# Patient Record
Sex: Female | Born: 1950
Health system: Southern US, Community
[De-identification: ages and names within clinical notes are randomized; demographics above are authoritative.]

## PROBLEM LIST (undated history)

## (undated) DIAGNOSIS — T7840XA Allergy, unspecified, initial encounter: Secondary | ICD-10-CM

## (undated) DIAGNOSIS — E785 Hyperlipidemia, unspecified: Secondary | ICD-10-CM

## (undated) DIAGNOSIS — C801 Malignant (primary) neoplasm, unspecified: Secondary | ICD-10-CM

## (undated) DIAGNOSIS — F419 Anxiety disorder, unspecified: Secondary | ICD-10-CM

## (undated) DIAGNOSIS — K219 Gastro-esophageal reflux disease without esophagitis: Secondary | ICD-10-CM

## (undated) DIAGNOSIS — I209 Angina pectoris, unspecified: Secondary | ICD-10-CM

## (undated) DIAGNOSIS — I10 Essential (primary) hypertension: Secondary | ICD-10-CM

## (undated) DIAGNOSIS — L570 Actinic keratosis: Secondary | ICD-10-CM

## (undated) DIAGNOSIS — I428 Other cardiomyopathies: Secondary | ICD-10-CM

## (undated) DIAGNOSIS — D649 Anemia, unspecified: Secondary | ICD-10-CM

## (undated) DIAGNOSIS — M069 Rheumatoid arthritis, unspecified: Secondary | ICD-10-CM

## (undated) DIAGNOSIS — F329 Major depressive disorder, single episode, unspecified: Secondary | ICD-10-CM

## (undated) DIAGNOSIS — K5790 Diverticulosis of intestine, part unspecified, without perforation or abscess without bleeding: Secondary | ICD-10-CM

## (undated) DIAGNOSIS — R918 Other nonspecific abnormal finding of lung field: Secondary | ICD-10-CM

## (undated) HISTORY — DX: Rheumatoid arthritis, unspecified: M06.9

## (undated) HISTORY — DX: Actinic keratosis: L57.0

## (undated) HISTORY — PX: BACK SURGERY: SHX140

## (undated) HISTORY — PX: COLONOSCOPY: SHX174

## (undated) HISTORY — DX: Essential (primary) hypertension: I10

## (undated) HISTORY — PX: CARPAL TUNNEL RELEASE: SHX101

## (undated) HISTORY — PX: ROTATOR CUFF REPAIR: SHX139

## (undated) HISTORY — DX: Allergy, unspecified, initial encounter: T78.40XA

## (undated) HISTORY — PX: ABDOMINAL HYSTERECTOMY: SHX81

## (undated) HISTORY — PX: KNEE SURGERY: SHX244

## (undated) HISTORY — PX: HERNIA REPAIR: SHX51

## (undated) HISTORY — PX: APPENDECTOMY: SHX54

## (undated) HISTORY — DX: Hyperlipidemia, unspecified: E78.5

## (undated) HISTORY — DX: Diverticulosis of intestine, part unspecified, without perforation or abscess without bleeding: K57.90

## (undated) HISTORY — DX: Anxiety disorder, unspecified: F41.9

## (undated) HISTORY — DX: Gastro-esophageal reflux disease without esophagitis: K21.9

## (undated) HISTORY — PX: CHOLECYSTECTOMY: SHX55

## (undated) HISTORY — PX: SHOULDER SURGERY: SHX246

## (undated) HISTORY — PX: CATARACT EXTRACTION: SUR2

## (undated) HISTORY — PX: UPPER GASTROINTESTINAL ENDOSCOPY: SHX188

## (undated) HISTORY — DX: Malignant (primary) neoplasm, unspecified: C80.1

## (undated) HISTORY — DX: Other nonspecific abnormal finding of lung field: R91.8

---

## 1997-11-01 ENCOUNTER — Ambulatory Visit (HOSPITAL_BASED_OUTPATIENT_CLINIC_OR_DEPARTMENT_OTHER): Admission: RE | Admit: 1997-11-01 | Discharge: 1997-11-01 | Payer: Self-pay | Admitting: Orthopedic Surgery

## 1998-04-19 ENCOUNTER — Ambulatory Visit (HOSPITAL_COMMUNITY): Admission: RE | Admit: 1998-04-19 | Discharge: 1998-04-19 | Payer: Self-pay | Admitting: Family Medicine

## 1998-04-21 ENCOUNTER — Ambulatory Visit (HOSPITAL_COMMUNITY): Admission: RE | Admit: 1998-04-21 | Discharge: 1998-04-21 | Payer: Self-pay | Admitting: Internal Medicine

## 1998-04-21 ENCOUNTER — Encounter: Payer: Self-pay | Admitting: Internal Medicine

## 1998-12-09 ENCOUNTER — Inpatient Hospital Stay (HOSPITAL_COMMUNITY): Admission: EM | Admit: 1998-12-09 | Discharge: 1998-12-11 | Payer: Self-pay | Admitting: Emergency Medicine

## 1998-12-18 ENCOUNTER — Ambulatory Visit (HOSPITAL_COMMUNITY): Admission: RE | Admit: 1998-12-18 | Discharge: 1998-12-18 | Payer: Self-pay | Admitting: *Deleted

## 1999-01-08 ENCOUNTER — Ambulatory Visit (HOSPITAL_COMMUNITY): Admission: RE | Admit: 1999-01-08 | Discharge: 1999-01-08 | Payer: Self-pay | Admitting: Gastroenterology

## 1999-02-07 ENCOUNTER — Ambulatory Visit (HOSPITAL_COMMUNITY): Admission: RE | Admit: 1999-02-07 | Discharge: 1999-02-07 | Payer: Self-pay | Admitting: Gastroenterology

## 1999-02-07 ENCOUNTER — Encounter: Payer: Self-pay | Admitting: Gastroenterology

## 1999-05-14 ENCOUNTER — Ambulatory Visit (HOSPITAL_COMMUNITY): Admission: RE | Admit: 1999-05-14 | Discharge: 1999-05-14 | Payer: Self-pay | Admitting: *Deleted

## 1999-06-04 ENCOUNTER — Encounter: Admission: RE | Admit: 1999-06-04 | Discharge: 1999-06-12 | Payer: Self-pay | Admitting: *Deleted

## 1999-06-29 ENCOUNTER — Ambulatory Visit (HOSPITAL_COMMUNITY): Admission: RE | Admit: 1999-06-29 | Discharge: 1999-06-29 | Payer: Self-pay | Admitting: *Deleted

## 2000-04-24 ENCOUNTER — Ambulatory Visit (HOSPITAL_COMMUNITY): Admission: RE | Admit: 2000-04-24 | Discharge: 2000-04-24 | Payer: Self-pay | Admitting: Internal Medicine

## 2002-07-19 ENCOUNTER — Ambulatory Visit (HOSPITAL_COMMUNITY): Admission: RE | Admit: 2002-07-19 | Discharge: 2002-07-19 | Payer: Self-pay | Admitting: Internal Medicine

## 2002-07-19 ENCOUNTER — Encounter: Payer: Self-pay | Admitting: Internal Medicine

## 2003-04-18 ENCOUNTER — Encounter: Payer: Self-pay | Admitting: Internal Medicine

## 2003-04-18 ENCOUNTER — Ambulatory Visit (HOSPITAL_COMMUNITY): Admission: RE | Admit: 2003-04-18 | Discharge: 2003-04-18 | Payer: Self-pay | Admitting: Internal Medicine

## 2003-05-28 ENCOUNTER — Emergency Department (HOSPITAL_COMMUNITY): Admission: EM | Admit: 2003-05-28 | Discharge: 2003-05-28 | Payer: Self-pay | Admitting: Emergency Medicine

## 2004-05-14 ENCOUNTER — Emergency Department (HOSPITAL_COMMUNITY): Admission: EM | Admit: 2004-05-14 | Discharge: 2004-05-14 | Payer: Self-pay | Admitting: Emergency Medicine

## 2004-07-05 ENCOUNTER — Ambulatory Visit: Payer: Self-pay | Admitting: Family Medicine

## 2004-08-08 ENCOUNTER — Ambulatory Visit: Payer: Self-pay | Admitting: Family Medicine

## 2004-08-08 ENCOUNTER — Encounter: Admission: RE | Admit: 2004-08-08 | Discharge: 2004-08-08 | Payer: Self-pay | Admitting: Sports Medicine

## 2004-08-16 ENCOUNTER — Ambulatory Visit: Payer: Self-pay | Admitting: Sports Medicine

## 2004-09-14 ENCOUNTER — Ambulatory Visit: Payer: Self-pay | Admitting: Family Medicine

## 2004-11-05 ENCOUNTER — Ambulatory Visit: Payer: Self-pay | Admitting: Family Medicine

## 2004-11-05 ENCOUNTER — Inpatient Hospital Stay (HOSPITAL_COMMUNITY): Admission: EM | Admit: 2004-11-05 | Discharge: 2004-11-06 | Payer: Self-pay | Admitting: Emergency Medicine

## 2004-11-12 ENCOUNTER — Ambulatory Visit: Payer: Self-pay | Admitting: Family Medicine

## 2004-11-22 ENCOUNTER — Ambulatory Visit: Payer: Self-pay | Admitting: Family Medicine

## 2005-01-23 ENCOUNTER — Ambulatory Visit: Payer: Self-pay | Admitting: Family Medicine

## 2005-02-19 ENCOUNTER — Ambulatory Visit (HOSPITAL_COMMUNITY): Admission: RE | Admit: 2005-02-19 | Discharge: 2005-02-19 | Payer: Self-pay | Admitting: Family Medicine

## 2005-03-19 ENCOUNTER — Ambulatory Visit: Payer: Self-pay | Admitting: Sports Medicine

## 2005-05-19 ENCOUNTER — Emergency Department (HOSPITAL_COMMUNITY): Admission: EM | Admit: 2005-05-19 | Discharge: 2005-05-19 | Payer: Self-pay | Admitting: Emergency Medicine

## 2005-08-21 ENCOUNTER — Ambulatory Visit: Payer: Self-pay | Admitting: Sports Medicine

## 2006-03-31 ENCOUNTER — Ambulatory Visit (HOSPITAL_COMMUNITY): Admission: RE | Admit: 2006-03-31 | Discharge: 2006-03-31 | Payer: Self-pay | Admitting: Family Medicine

## 2006-05-07 ENCOUNTER — Ambulatory Visit: Payer: Self-pay | Admitting: Family Medicine

## 2006-05-26 ENCOUNTER — Ambulatory Visit: Payer: Self-pay | Admitting: Family Medicine

## 2006-08-28 DIAGNOSIS — E119 Type 2 diabetes mellitus without complications: Secondary | ICD-10-CM

## 2006-08-28 DIAGNOSIS — J309 Allergic rhinitis, unspecified: Secondary | ICD-10-CM

## 2006-08-28 DIAGNOSIS — F411 Generalized anxiety disorder: Secondary | ICD-10-CM | POA: Insufficient documentation

## 2006-08-28 DIAGNOSIS — E1169 Type 2 diabetes mellitus with other specified complication: Secondary | ICD-10-CM | POA: Insufficient documentation

## 2006-08-28 DIAGNOSIS — E669 Obesity, unspecified: Secondary | ICD-10-CM | POA: Insufficient documentation

## 2006-08-28 DIAGNOSIS — E785 Hyperlipidemia, unspecified: Secondary | ICD-10-CM

## 2006-08-28 DIAGNOSIS — F419 Anxiety disorder, unspecified: Secondary | ICD-10-CM

## 2006-08-28 DIAGNOSIS — F329 Major depressive disorder, single episode, unspecified: Secondary | ICD-10-CM

## 2006-08-28 HISTORY — DX: Generalized anxiety disorder: F41.1

## 2006-08-28 HISTORY — DX: Hyperlipidemia, unspecified: E78.5

## 2006-08-28 HISTORY — DX: Type 2 diabetes mellitus without complications: E11.9

## 2006-08-28 HISTORY — DX: Allergic rhinitis, unspecified: J30.9

## 2006-09-30 ENCOUNTER — Ambulatory Visit: Payer: Self-pay | Admitting: Sports Medicine

## 2006-09-30 LAB — CONVERTED CEMR LAB: Hgb A1c MFr Bld: 7 %

## 2006-11-11 ENCOUNTER — Telehealth: Payer: Self-pay | Admitting: *Deleted

## 2006-11-11 ENCOUNTER — Ambulatory Visit: Payer: Self-pay | Admitting: Family Medicine

## 2006-11-11 LAB — CONVERTED CEMR LAB
Bilirubin Urine: NEGATIVE
Glucose, Urine, Semiquant: NEGATIVE
Ketones, urine, test strip: NEGATIVE
Specific Gravity, Urine: 1.025
pH: 7

## 2006-12-22 ENCOUNTER — Encounter: Payer: Self-pay | Admitting: Family Medicine

## 2007-02-27 ENCOUNTER — Telehealth: Payer: Self-pay | Admitting: *Deleted

## 2007-03-05 ENCOUNTER — Encounter: Payer: Self-pay | Admitting: Family Medicine

## 2007-03-20 ENCOUNTER — Telehealth: Payer: Self-pay | Admitting: *Deleted

## 2007-04-21 ENCOUNTER — Encounter: Payer: Self-pay | Admitting: Family Medicine

## 2007-05-06 ENCOUNTER — Ambulatory Visit (HOSPITAL_COMMUNITY): Admission: RE | Admit: 2007-05-06 | Discharge: 2007-05-06 | Payer: Self-pay | Admitting: Family Medicine

## 2007-05-12 ENCOUNTER — Encounter: Payer: Self-pay | Admitting: Family Medicine

## 2007-06-04 ENCOUNTER — Encounter: Payer: Self-pay | Admitting: Family Medicine

## 2007-06-12 ENCOUNTER — Ambulatory Visit: Payer: Self-pay | Admitting: Family Medicine

## 2007-06-12 ENCOUNTER — Encounter (INDEPENDENT_AMBULATORY_CARE_PROVIDER_SITE_OTHER): Payer: Self-pay | Admitting: Family Medicine

## 2007-06-12 LAB — CONVERTED CEMR LAB
ALT: 16 units/L (ref 0–35)
AST: 14 units/L (ref 0–37)
BUN: 11 mg/dL (ref 6–23)
Basophils Absolute: 0 10*3/uL (ref 0.0–0.1)
Basophils Relative: 0 % (ref 0–1)
CO2: 25 meq/L (ref 19–32)
Calcium: 9.4 mg/dL (ref 8.4–10.5)
Creatinine, Ser: 0.68 mg/dL (ref 0.40–1.20)
Eosinophils Relative: 1 % (ref 0–5)
HCT: 42.7 % (ref 36.0–46.0)
Hemoglobin: 14.1 g/dL (ref 12.0–15.0)
MCHC: 33 g/dL (ref 30.0–36.0)
Monocytes Absolute: 0.7 10*3/uL (ref 0.1–1.0)
Monocytes Relative: 5 % (ref 3–12)
RBC: 4.53 M/uL (ref 3.87–5.11)
RDW: 12.7 % (ref 11.5–15.5)
Total Bilirubin: 0.5 mg/dL (ref 0.3–1.2)
Total CK: 67 units/L (ref 7–177)

## 2007-06-15 ENCOUNTER — Telehealth: Payer: Self-pay | Admitting: Family Medicine

## 2007-07-06 ENCOUNTER — Telehealth: Payer: Self-pay | Admitting: *Deleted

## 2007-07-07 ENCOUNTER — Ambulatory Visit: Payer: Self-pay | Admitting: Family Medicine

## 2007-07-07 LAB — CONVERTED CEMR LAB
Glucose, Urine, Semiquant: NEGATIVE
Ketones, urine, test strip: NEGATIVE
Protein, U semiquant: NEGATIVE
pH: 7

## 2007-07-13 ENCOUNTER — Ambulatory Visit: Payer: Self-pay | Admitting: Family Medicine

## 2007-07-13 DIAGNOSIS — J329 Chronic sinusitis, unspecified: Secondary | ICD-10-CM | POA: Insufficient documentation

## 2007-07-13 LAB — CONVERTED CEMR LAB
Ketones, urine, test strip: NEGATIVE
Nitrite: NEGATIVE
Specific Gravity, Urine: 1.01
WBC Urine, dipstick: NEGATIVE

## 2007-07-15 ENCOUNTER — Encounter: Admission: RE | Admit: 2007-07-15 | Discharge: 2007-07-15 | Payer: Self-pay | Admitting: Family Medicine

## 2007-07-29 ENCOUNTER — Encounter: Payer: Self-pay | Admitting: Family Medicine

## 2007-08-06 ENCOUNTER — Encounter: Payer: Self-pay | Admitting: Family Medicine

## 2007-10-27 ENCOUNTER — Encounter: Payer: Self-pay | Admitting: Family Medicine

## 2007-11-10 ENCOUNTER — Encounter: Payer: Self-pay | Admitting: Family Medicine

## 2007-11-17 ENCOUNTER — Encounter: Payer: Self-pay | Admitting: Family Medicine

## 2007-12-10 ENCOUNTER — Ambulatory Visit: Payer: Self-pay | Admitting: Family Medicine

## 2008-01-04 ENCOUNTER — Emergency Department (HOSPITAL_COMMUNITY): Admission: EM | Admit: 2008-01-04 | Discharge: 2008-01-04 | Payer: Self-pay | Admitting: Emergency Medicine

## 2008-01-05 ENCOUNTER — Encounter: Payer: Self-pay | Admitting: *Deleted

## 2008-01-06 ENCOUNTER — Ambulatory Visit: Payer: Self-pay | Admitting: Family Medicine

## 2008-02-15 ENCOUNTER — Telehealth: Payer: Self-pay | Admitting: *Deleted

## 2008-02-16 ENCOUNTER — Ambulatory Visit: Payer: Self-pay | Admitting: Family Medicine

## 2008-02-25 ENCOUNTER — Telehealth: Payer: Self-pay | Admitting: *Deleted

## 2008-02-26 ENCOUNTER — Ambulatory Visit: Payer: Self-pay | Admitting: Family Medicine

## 2008-03-09 ENCOUNTER — Encounter: Payer: Self-pay | Admitting: Family Medicine

## 2008-03-14 ENCOUNTER — Ambulatory Visit: Payer: Self-pay | Admitting: Family Medicine

## 2008-04-05 ENCOUNTER — Ambulatory Visit: Payer: Self-pay | Admitting: Family Medicine

## 2008-04-13 ENCOUNTER — Telehealth: Payer: Self-pay | Admitting: *Deleted

## 2008-04-13 ENCOUNTER — Ambulatory Visit: Payer: Self-pay | Admitting: Family Medicine

## 2008-04-29 ENCOUNTER — Encounter: Payer: Self-pay | Admitting: *Deleted

## 2008-04-29 ENCOUNTER — Encounter: Payer: Self-pay | Admitting: Family Medicine

## 2008-05-24 ENCOUNTER — Ambulatory Visit (HOSPITAL_COMMUNITY): Admission: RE | Admit: 2008-05-24 | Discharge: 2008-05-24 | Payer: Self-pay | Admitting: Family Medicine

## 2008-06-13 ENCOUNTER — Telehealth: Payer: Self-pay | Admitting: *Deleted

## 2008-06-13 ENCOUNTER — Ambulatory Visit: Payer: Self-pay | Admitting: Family Medicine

## 2008-06-13 LAB — CONVERTED CEMR LAB
Bilirubin Urine: NEGATIVE
Ketones, urine, test strip: NEGATIVE
Nitrite: NEGATIVE
Urobilinogen, UA: 0.2

## 2008-06-14 ENCOUNTER — Encounter: Payer: Self-pay | Admitting: Family Medicine

## 2008-06-28 ENCOUNTER — Ambulatory Visit: Payer: Self-pay | Admitting: Family Medicine

## 2008-06-28 ENCOUNTER — Telehealth: Payer: Self-pay | Admitting: *Deleted

## 2008-06-28 LAB — CONVERTED CEMR LAB: Rapid Strep: POSITIVE

## 2008-07-25 ENCOUNTER — Emergency Department (HOSPITAL_COMMUNITY): Admission: EM | Admit: 2008-07-25 | Discharge: 2008-07-25 | Payer: Self-pay | Admitting: Emergency Medicine

## 2008-08-08 ENCOUNTER — Encounter: Payer: Self-pay | Admitting: *Deleted

## 2008-08-22 ENCOUNTER — Ambulatory Visit: Payer: Self-pay | Admitting: Family Medicine

## 2008-08-22 ENCOUNTER — Encounter: Payer: Self-pay | Admitting: Family Medicine

## 2008-08-22 DIAGNOSIS — K219 Gastro-esophageal reflux disease without esophagitis: Secondary | ICD-10-CM | POA: Insufficient documentation

## 2008-08-22 LAB — CONVERTED CEMR LAB: Hgb A1c MFr Bld: 7 %

## 2008-08-23 ENCOUNTER — Encounter: Payer: Self-pay | Admitting: Family Medicine

## 2008-08-23 LAB — CONVERTED CEMR LAB
ALT: 14 units/L (ref 0–35)
AST: 11 units/L (ref 0–37)
BUN: 12 mg/dL (ref 6–23)
CO2: 20 meq/L (ref 19–32)
Calcium: 9.3 mg/dL (ref 8.4–10.5)
Cholesterol: 119 mg/dL (ref 0–200)
Creatinine, Ser: 0.6 mg/dL (ref 0.40–1.20)
HDL: 48 mg/dL (ref >39)
Total Bilirubin: 0.4 mg/dL (ref 0.3–1.2)
Total CHOL/HDL Ratio: 2.5
VLDL: 25 mg/dL (ref 0–40)

## 2008-11-18 ENCOUNTER — Ambulatory Visit: Payer: Self-pay | Admitting: Family Medicine

## 2008-11-25 ENCOUNTER — Ambulatory Visit: Payer: Self-pay | Admitting: Family Medicine

## 2008-11-30 ENCOUNTER — Telehealth: Payer: Self-pay | Admitting: *Deleted

## 2009-02-01 ENCOUNTER — Telehealth: Payer: Self-pay | Admitting: Family Medicine

## 2009-02-04 ENCOUNTER — Emergency Department (HOSPITAL_COMMUNITY): Admission: EM | Admit: 2009-02-04 | Discharge: 2009-02-04 | Payer: Self-pay | Admitting: Emergency Medicine

## 2009-02-13 ENCOUNTER — Telehealth: Payer: Self-pay | Admitting: Family Medicine

## 2009-03-09 ENCOUNTER — Ambulatory Visit: Payer: Self-pay | Admitting: Family Medicine

## 2009-04-12 ENCOUNTER — Telehealth: Payer: Self-pay | Admitting: Family Medicine

## 2009-04-27 ENCOUNTER — Ambulatory Visit: Payer: Self-pay | Admitting: Sports Medicine

## 2009-04-27 DIAGNOSIS — IMO0002 Reserved for concepts with insufficient information to code with codable children: Secondary | ICD-10-CM | POA: Insufficient documentation

## 2009-04-27 HISTORY — DX: Reserved for concepts with insufficient information to code with codable children: IMO0002

## 2009-05-02 ENCOUNTER — Ambulatory Visit: Payer: Self-pay | Admitting: Family Medicine

## 2009-05-23 ENCOUNTER — Telehealth: Payer: Self-pay | Admitting: Family Medicine

## 2009-05-29 ENCOUNTER — Ambulatory Visit: Payer: Self-pay | Admitting: Sports Medicine

## 2009-05-29 ENCOUNTER — Ambulatory Visit (HOSPITAL_COMMUNITY): Admission: RE | Admit: 2009-05-29 | Discharge: 2009-05-29 | Payer: Self-pay | Admitting: Family Medicine

## 2009-05-29 ENCOUNTER — Ambulatory Visit (HOSPITAL_COMMUNITY): Admission: RE | Admit: 2009-05-29 | Discharge: 2009-05-29 | Payer: Self-pay | Admitting: Sports Medicine

## 2009-05-29 DIAGNOSIS — G5600 Carpal tunnel syndrome, unspecified upper limb: Secondary | ICD-10-CM | POA: Insufficient documentation

## 2009-05-29 DIAGNOSIS — M25539 Pain in unspecified wrist: Secondary | ICD-10-CM | POA: Insufficient documentation

## 2009-05-29 HISTORY — DX: Carpal tunnel syndrome, unspecified upper limb: G56.00

## 2009-06-07 ENCOUNTER — Telehealth: Payer: Self-pay | Admitting: Family Medicine

## 2009-06-08 ENCOUNTER — Encounter: Payer: Self-pay | Admitting: Sports Medicine

## 2009-06-08 ENCOUNTER — Ambulatory Visit: Payer: Self-pay | Admitting: Family Medicine

## 2009-06-08 LAB — CONVERTED CEMR LAB: Rapid Strep: POSITIVE

## 2009-06-21 ENCOUNTER — Telehealth: Payer: Self-pay | Admitting: Family Medicine

## 2009-07-05 ENCOUNTER — Ambulatory Visit: Payer: Self-pay | Admitting: Family Medicine

## 2009-07-10 ENCOUNTER — Telehealth: Payer: Self-pay | Admitting: Family Medicine

## 2009-07-12 ENCOUNTER — Telehealth: Payer: Self-pay | Admitting: Family Medicine

## 2009-07-17 ENCOUNTER — Telehealth: Payer: Self-pay | Admitting: *Deleted

## 2009-08-14 ENCOUNTER — Ambulatory Visit: Payer: Self-pay | Admitting: Family Medicine

## 2009-08-14 ENCOUNTER — Inpatient Hospital Stay (HOSPITAL_COMMUNITY): Admission: AD | Admit: 2009-08-14 | Discharge: 2009-08-16 | Payer: Self-pay | Admitting: Family Medicine

## 2009-08-14 ENCOUNTER — Encounter: Payer: Self-pay | Admitting: Family Medicine

## 2009-08-15 LAB — CONVERTED CEMR LAB: Hemoglobin: 12.1 g/dL

## 2009-08-23 ENCOUNTER — Ambulatory Visit: Payer: Self-pay | Admitting: Family Medicine

## 2009-08-23 ENCOUNTER — Encounter: Payer: Self-pay | Admitting: Family Medicine

## 2009-08-23 LAB — CONVERTED CEMR LAB
Basophils Relative: 0 % (ref 0–1)
Eosinophils Relative: 3 % (ref 0–5)
HCT: 40.8 % (ref 36.0–46.0)
Hemoglobin: 13.2 g/dL (ref 12.0–15.0)
Lymphocytes Relative: 31 % (ref 12–46)
MCHC: 32.4 g/dL (ref 30.0–36.0)
Monocytes Absolute: 0.5 10*3/uL (ref 0.1–1.0)
Monocytes Relative: 6 % (ref 3–12)
Neutro Abs: 5.2 10*3/uL (ref 1.7–7.7)
RBC: 4.42 M/uL (ref 3.87–5.11)
RDW: 13.1 % (ref 11.5–15.5)
Rhuematoid fact SerPl-aCnc: <20 intl units/mL (ref 0–20)
Uric Acid, Serum: 2.8 mg/dL (ref 2.4–7.0)

## 2009-08-30 ENCOUNTER — Ambulatory Visit: Payer: Self-pay | Admitting: Family Medicine

## 2009-08-30 DIAGNOSIS — M75120 Complete rotator cuff tear or rupture of unspecified shoulder, not specified as traumatic: Secondary | ICD-10-CM

## 2009-09-15 ENCOUNTER — Ambulatory Visit: Payer: Self-pay | Admitting: Family Medicine

## 2009-09-19 ENCOUNTER — Encounter: Payer: Self-pay | Admitting: Family Medicine

## 2009-10-04 ENCOUNTER — Telehealth: Payer: Self-pay | Admitting: Family Medicine

## 2009-10-13 ENCOUNTER — Telehealth (INDEPENDENT_AMBULATORY_CARE_PROVIDER_SITE_OTHER): Payer: Self-pay | Admitting: *Deleted

## 2009-10-16 ENCOUNTER — Telehealth: Payer: Self-pay | Admitting: Family Medicine

## 2009-10-18 ENCOUNTER — Ambulatory Visit: Payer: Self-pay | Admitting: Family Medicine

## 2009-10-26 ENCOUNTER — Ambulatory Visit: Payer: Self-pay | Admitting: Sports Medicine

## 2009-10-26 ENCOUNTER — Encounter: Payer: Self-pay | Admitting: Family Medicine

## 2009-10-26 LAB — CONVERTED CEMR LAB: CRP, High Sensitivity: 2.8

## 2009-11-07 ENCOUNTER — Ambulatory Visit: Payer: Self-pay | Admitting: Family Medicine

## 2009-11-07 LAB — CONVERTED CEMR LAB: Hgb A1c MFr Bld: 7.7 %

## 2009-11-18 ENCOUNTER — Telehealth: Payer: Self-pay | Admitting: Family Medicine

## 2009-11-18 ENCOUNTER — Emergency Department (HOSPITAL_COMMUNITY): Admission: EM | Admit: 2009-11-18 | Discharge: 2009-11-18 | Payer: Self-pay | Admitting: Emergency Medicine

## 2009-11-20 ENCOUNTER — Ambulatory Visit: Payer: Self-pay | Admitting: Family Medicine

## 2009-11-20 ENCOUNTER — Telehealth: Payer: Self-pay | Admitting: Family Medicine

## 2009-12-04 ENCOUNTER — Telehealth: Payer: Self-pay | Admitting: Family Medicine

## 2009-12-07 ENCOUNTER — Encounter: Payer: Self-pay | Admitting: Family Medicine

## 2009-12-08 ENCOUNTER — Ambulatory Visit (HOSPITAL_COMMUNITY): Admission: RE | Admit: 2009-12-08 | Discharge: 2009-12-08 | Payer: Self-pay | Admitting: Family Medicine

## 2009-12-08 ENCOUNTER — Ambulatory Visit: Payer: Self-pay | Admitting: Family Medicine

## 2009-12-08 ENCOUNTER — Telehealth (INDEPENDENT_AMBULATORY_CARE_PROVIDER_SITE_OTHER): Payer: Self-pay | Admitting: *Deleted

## 2009-12-08 ENCOUNTER — Encounter: Payer: Self-pay | Admitting: Family Medicine

## 2009-12-08 DIAGNOSIS — R0789 Other chest pain: Secondary | ICD-10-CM

## 2009-12-13 ENCOUNTER — Telehealth: Payer: Self-pay | Admitting: Family Medicine

## 2009-12-14 ENCOUNTER — Ambulatory Visit: Payer: Self-pay | Admitting: Family Medicine

## 2009-12-14 ENCOUNTER — Encounter: Payer: Self-pay | Admitting: Family Medicine

## 2009-12-17 LAB — CONVERTED CEMR LAB
Anti Nuclear Antibody(ANA): NEGATIVE
Basophils Absolute: 0 10*3/uL (ref 0.0–0.1)
CRP, High Sensitivity: 5.2 — ABNORMAL HIGH
Lymphocytes Relative: 21 % (ref 12–46)
Lymphs Abs: 1.7 10*3/uL (ref 0.7–4.0)
Neutro Abs: 5.7 10*3/uL (ref 1.7–7.7)
Platelets: 283 10*3/uL (ref 150–400)
Potassium: 4.1 meq/L (ref 3.5–5.3)
RDW: 13.8 % (ref 11.5–15.5)
Rhuematoid fact SerPl-aCnc: <20 intl units/mL (ref 0–20)
Sodium: 136 meq/L (ref 135–145)
Uric Acid, Serum: 2.6 mg/dL (ref 2.4–7.0)
WBC: 8.1 10*3/uL (ref 4.0–10.5)

## 2009-12-18 ENCOUNTER — Telehealth: Payer: Self-pay | Admitting: Family Medicine

## 2009-12-19 ENCOUNTER — Encounter: Payer: Self-pay | Admitting: Family Medicine

## 2009-12-20 LAB — CONVERTED CEMR LAB
AST: 9 units/L (ref 0–37)
Albumin: 4.3 g/dL (ref 3.5–5.2)
Cyclic Citrullin Peptide Ab: 83.9 units — ABNORMAL HIGH (ref 0–5)
Total Bilirubin: 0.6 mg/dL (ref 0.3–1.2)

## 2010-01-03 ENCOUNTER — Encounter: Payer: Self-pay | Admitting: Family Medicine

## 2010-01-03 ENCOUNTER — Ambulatory Visit: Payer: Self-pay | Admitting: Family Medicine

## 2010-01-04 LAB — CONVERTED CEMR LAB
AST: 7 units/L (ref 0–37)
Albumin: 4 g/dL (ref 3.5–5.2)
Alkaline Phosphatase: 67 units/L (ref 39–117)
Basophils Absolute: 0 10*3/uL (ref 0.0–0.1)
Glucose, Bld: 230 mg/dL — ABNORMAL HIGH (ref 70–99)
Hemoglobin: 12.8 g/dL (ref 12.0–15.0)
Lymphocytes Relative: 26 % (ref 12–46)
Monocytes Absolute: 0.9 10*3/uL (ref 0.1–1.0)
Neutro Abs: 9.9 10*3/uL — ABNORMAL HIGH (ref 1.7–7.7)
Potassium: 3.9 meq/L (ref 3.5–5.3)
RBC: 4.3 M/uL (ref 3.87–5.11)
RDW: 14.2 % (ref 11.5–15.5)
Sodium: 134 meq/L — ABNORMAL LOW (ref 135–145)
Total Protein: 6.5 g/dL (ref 6.0–8.3)
WBC: 14.7 10*3/uL — ABNORMAL HIGH (ref 4.0–10.5)

## 2010-01-08 ENCOUNTER — Ambulatory Visit: Payer: Self-pay | Admitting: Family Medicine

## 2010-01-08 DIAGNOSIS — M059 Rheumatoid arthritis with rheumatoid factor, unspecified: Secondary | ICD-10-CM

## 2010-01-08 DIAGNOSIS — M069 Rheumatoid arthritis, unspecified: Secondary | ICD-10-CM

## 2010-01-08 HISTORY — DX: Rheumatoid arthritis with rheumatoid factor, unspecified: M05.9

## 2010-01-23 ENCOUNTER — Telehealth: Payer: Self-pay | Admitting: Family Medicine

## 2010-01-29 ENCOUNTER — Encounter: Payer: Self-pay | Admitting: Family Medicine

## 2010-01-29 ENCOUNTER — Ambulatory Visit: Payer: Self-pay | Admitting: Family Medicine

## 2010-01-29 LAB — CONVERTED CEMR LAB
AST: 12 units/L (ref 0–37)
Albumin: 4.1 g/dL (ref 3.5–5.2)
Alkaline Phosphatase: 62 units/L (ref 39–117)
MCHC: 31.4 g/dL (ref 30.0–36.0)
Potassium: 4.2 meq/L (ref 3.5–5.3)
RDW: 14.5 % (ref 11.5–15.5)
Sodium: 135 meq/L (ref 135–145)
Total Protein: 6.5 g/dL (ref 6.0–8.3)

## 2010-02-06 ENCOUNTER — Ambulatory Visit: Payer: Self-pay | Admitting: Family Medicine

## 2010-02-12 ENCOUNTER — Telehealth: Payer: Self-pay | Admitting: Family Medicine

## 2010-02-15 ENCOUNTER — Encounter: Payer: Self-pay | Admitting: Family Medicine

## 2010-03-06 ENCOUNTER — Encounter: Payer: Self-pay | Admitting: Family Medicine

## 2010-03-07 ENCOUNTER — Ambulatory Visit: Payer: Self-pay | Admitting: Family Medicine

## 2010-03-09 ENCOUNTER — Telehealth: Payer: Self-pay | Admitting: Family Medicine

## 2010-03-14 ENCOUNTER — Encounter: Payer: Self-pay | Admitting: Family Medicine

## 2010-03-14 DIAGNOSIS — J45909 Unspecified asthma, uncomplicated: Secondary | ICD-10-CM

## 2010-03-14 HISTORY — DX: Unspecified asthma, uncomplicated: J45.909

## 2010-03-20 ENCOUNTER — Ambulatory Visit: Payer: Self-pay | Admitting: Family Medicine

## 2010-03-27 ENCOUNTER — Ambulatory Visit: Payer: Self-pay | Admitting: Family Medicine

## 2010-04-02 ENCOUNTER — Telehealth: Payer: Self-pay | Admitting: Family Medicine

## 2010-04-04 ENCOUNTER — Telehealth (INDEPENDENT_AMBULATORY_CARE_PROVIDER_SITE_OTHER): Payer: Self-pay | Admitting: *Deleted

## 2010-04-06 ENCOUNTER — Ambulatory Visit: Payer: Self-pay | Admitting: Family Medicine

## 2010-04-19 ENCOUNTER — Encounter: Payer: Self-pay | Admitting: Family Medicine

## 2010-04-26 ENCOUNTER — Encounter: Payer: Self-pay | Admitting: Family Medicine

## 2010-04-26 ENCOUNTER — Ambulatory Visit: Payer: Self-pay | Admitting: Family Medicine

## 2010-04-26 LAB — CONVERTED CEMR LAB
CO2: 25 meq/L (ref 19–32)
Creatinine, Ser: 0.58 mg/dL (ref 0.40–1.20)
Eosinophils Absolute: 0.2 10*3/uL (ref 0.0–0.7)
Glucose, Bld: 136 mg/dL — ABNORMAL HIGH (ref 70–99)
HCT: 38.2 % (ref 36.0–46.0)
Lymphocytes Relative: 32 % (ref 12–46)
Lymphs Abs: 2.7 10*3/uL (ref 0.7–4.0)
MCV: 91.6 fL (ref 78.0–100.0)
Monocytes Relative: 10 % (ref 3–12)
Neutrophils Relative %: 55 % (ref 43–77)
RBC: 4.17 M/uL (ref 3.87–5.11)
Total Bilirubin: 0.4 mg/dL (ref 0.3–1.2)
Total Protein: 6.7 g/dL (ref 6.0–8.3)
WBC: 8.5 10*3/uL (ref 4.0–10.5)

## 2010-05-02 ENCOUNTER — Telehealth: Payer: Self-pay | Admitting: *Deleted

## 2010-05-10 ENCOUNTER — Telehealth (INDEPENDENT_AMBULATORY_CARE_PROVIDER_SITE_OTHER): Payer: Self-pay | Admitting: *Deleted

## 2010-05-15 ENCOUNTER — Encounter: Payer: Self-pay | Admitting: Family Medicine

## 2010-05-16 ENCOUNTER — Ambulatory Visit: Payer: Self-pay | Admitting: Family Medicine

## 2010-05-17 ENCOUNTER — Encounter: Payer: Self-pay | Admitting: Sports Medicine

## 2010-05-17 ENCOUNTER — Encounter: Payer: Self-pay | Admitting: Family Medicine

## 2010-05-22 ENCOUNTER — Ambulatory Visit: Payer: Self-pay | Admitting: Family Medicine

## 2010-05-22 DIAGNOSIS — L03039 Cellulitis of unspecified toe: Secondary | ICD-10-CM | POA: Insufficient documentation

## 2010-06-12 ENCOUNTER — Ambulatory Visit: Payer: Self-pay | Admitting: Family Medicine

## 2010-06-12 DIAGNOSIS — M25579 Pain in unspecified ankle and joints of unspecified foot: Secondary | ICD-10-CM

## 2010-06-14 ENCOUNTER — Telehealth: Payer: Self-pay | Admitting: Family Medicine

## 2010-06-14 ENCOUNTER — Ambulatory Visit (HOSPITAL_COMMUNITY)
Admission: RE | Admit: 2010-06-14 | Discharge: 2010-06-14 | Payer: Self-pay | Source: Home / Self Care | Attending: Family Medicine | Admitting: Family Medicine

## 2010-06-19 ENCOUNTER — Telehealth: Payer: Self-pay | Admitting: Family Medicine

## 2010-06-27 ENCOUNTER — Telehealth: Payer: Self-pay | Admitting: Family Medicine

## 2010-07-05 ENCOUNTER — Encounter: Payer: Self-pay | Admitting: Family Medicine

## 2010-07-05 ENCOUNTER — Ambulatory Visit
Admission: RE | Admit: 2010-07-05 | Discharge: 2010-07-05 | Payer: Self-pay | Source: Home / Self Care | Attending: Family Medicine | Admitting: Family Medicine

## 2010-07-05 LAB — CONVERTED CEMR LAB: Hgb A1c MFr Bld: 7.3 %

## 2010-07-09 LAB — CONVERTED CEMR LAB
ALT: 24 units/L (ref 0–35)
Albumin: 4.2 g/dL (ref 3.5–5.2)
Basophils Absolute: 0 10*3/uL (ref 0.0–0.1)
CO2: 20 meq/L (ref 19–32)
Calcium: 9.2 mg/dL (ref 8.4–10.5)
Chloride: 104 meq/L (ref 96–112)
Creatinine, Ser: 0.57 mg/dL (ref 0.40–1.20)
Lymphocytes Relative: 24 % (ref 12–46)
Lymphs Abs: 1.9 10*3/uL (ref 0.7–4.0)
Neutro Abs: 5.5 10*3/uL (ref 1.7–7.7)
Neutrophils Relative %: 68 % (ref 43–77)
Platelets: 315 10*3/uL (ref 150–400)
Potassium: 4.1 meq/L (ref 3.5–5.3)
RDW: 14.6 % (ref 11.5–15.5)
Total Protein: 6.3 g/dL (ref 6.0–8.3)
WBC: 8.1 10*3/uL (ref 4.0–10.5)

## 2010-07-12 ENCOUNTER — Telehealth: Payer: Self-pay | Admitting: Family Medicine

## 2010-07-12 ENCOUNTER — Ambulatory Visit
Admission: RE | Admit: 2010-07-12 | Discharge: 2010-07-12 | Payer: Self-pay | Source: Home / Self Care | Attending: Family Medicine | Admitting: Family Medicine

## 2010-07-17 ENCOUNTER — Ambulatory Visit: Admission: RE | Admit: 2010-07-17 | Discharge: 2010-07-17 | Payer: Self-pay | Source: Home / Self Care

## 2010-07-19 ENCOUNTER — Telehealth: Payer: Self-pay | Admitting: *Deleted

## 2010-07-31 ENCOUNTER — Ambulatory Visit (HOSPITAL_COMMUNITY)
Admission: RE | Admit: 2010-07-31 | Discharge: 2010-07-31 | Payer: Self-pay | Source: Home / Self Care | Attending: Gastroenterology | Admitting: Gastroenterology

## 2010-07-31 ENCOUNTER — Encounter: Payer: Self-pay | Admitting: Family Medicine

## 2010-07-31 NOTE — Miscellaneous (Signed)
Summary: walk in  Clinical Lists Changes front office states they just got a call from pt saying she was on her way in for .Marland KitchenGolden Circle RN  August 14, 2009 8:42 AM   c/o L sided cp as of this am & L arm pain since last night.. crying & up all night. rarely checks cbgs. place in schedule per Dr. Deirdre Priest.[name] RN  August 14, 2009 9:05 AM  <no value>

## 2010-07-31 NOTE — Assessment & Plan Note (Signed)
Summary: pt summary     Impression & Recommendations:  Problem # 1:  POLYARTHRITIS (ICD-719.49) recently worsening over the past 6 months. Rheum labs in Feb negative . Has referral to [facility] rheum pending for December. Have injected multiple joints with varying success. Has taken narcotics intermittently for pain but pt has been appropriate with their usage and demonstrates responsible attitude towards them.   Problem # 2:  ANXIETY (ICD-300.00) stressful home situation and financial difficulties; last filled klonipin 12/10 #60. Pt has not requested another refill. Removed from med list.  The following medications were removed from the medication list:    Klonopin 0.5 Mg Tabs (Clonazepam) .Marland Kitchen... Take one tablet two times a day as needed for anxiety Her updated medication list for this problem includes:    Celexa 40 Mg Tabs (Citalopram hydrobromide) .Marland Kitchen... Take one tab by mouth daily  Complete Medication List: 1)  Bayer Childrens Aspirin 81 Mg Chew (Aspirin) .... Take 1 tablet by mouth once a day 2)  Crestor 10 Mg Tabs (Rosuvastatin calcium) .... Take 1 tablet by mouth at bedtime 3)  Metformin Hcl 1000 Mg Tabs (Metformin hcl) .... Take 1 tablet by mouth two times a day 4)  Nasacort Aq 55 Mcg/act Aers (Triamcinolone acetonide(nasal)) .... Inhale 2 sprays in each nostril as directed once a day 5)  Celexa 40 Mg Tabs (Citalopram hydrobromide) .... Take one tab by mouth daily 6)  Clarinex-d 24 Hour 5-240 Mg Xr24h-tab (Desloratadine-pseudoephedrine) .... One by mouth daily for allergies 7)  Protonix 40 Mg Tbec (Pantoprazole sodium) .... One by mouth daily 8)  Ventolin Hfa 108 (90 Base) Mcg/act Aers (Albuterol sulfate) .... 9 gm mdi for $9 2 puffs qid as needed for wheeze 9)  Prednisone 20 Mg Tabs (Prednisone) .... 2 tabs by mouth daily x 5 days, 1 tab by mouth daily x 5 days 10)  Fluconazole 150 Mg Tabs (Fluconazole) .... One tab by mouth x 1, repeat in 72 hours 11)  Percocet 5-325 Mg Tabs  (Oxycodone-acetaminophen) .Marland Kitchen.. 1-2 tabs every 4-6 hours as needed 12)  Diclofenac Sodium 75 Mg Tbec (Diclofenac sodium) .... One by mouth two times a day -- take with food    Allergies: 1)  ! Sulfa

## 2010-07-31 NOTE — Assessment & Plan Note (Signed)
 Summary: sore throat-xposed to strep/Hodgeman/everhart's pt   Vital Signs:  Patient profile:   60 year old female Weight:      174 pounds Temp:     97.9 degrees F oral Pulse rate:   73 / minute Pulse rhythm:   regular BP sitting:   125 / 78  (right arm) Cuff size:   regular  Vitals Entered By: Bascom Pica CMA (June 08, 2009 8:35 AM)  Primary Care Provider:  JEREL PRESCOTT  MD   History of Present Illness: SORE THROAT  Onset: 1 week Description: throat pain Modifying factors: left ear pain  Symptoms  Fever:  no URI symptoms: rhinorrhea Cough: no Headache: no Rash:  no Swollen glands: no   Recent Strep Exposure: yes LUQ pain: no Heartburn/brash: no Allergy Symptoms: no  Red Flags STD exposure: no Breathing difficulty: no Drooling: no Trismus: no     Current Medications (verified): 1)  Bayer Childrens Aspirin  81 Mg Chew (Aspirin ) .... Take 1 Tablet By Mouth Once A Day 2)  Crestor  10 Mg Tabs (Rosuvastatin  Calcium ) .... Take 1 Tablet By Mouth At Bedtime 3)  Metformin  Hcl 1000 Mg Tabs (Metformin  Hcl) .... Take 1 Tablet By Mouth Two Times A Day 4)  Nasacort  Aq 55 Mcg/act Aers (Triamcinolone  Acetonide(Nasal)) .... Inhale 2 Sprays in Each Nostril As Directed Once A Day 5)  Celexa  40 Mg  Tabs (Citalopram  Hydrobromide) .... Take One Tab By Mouth Daily 6)  Allegra-D 12 Hour 60-120 Mg Xr12h-Tab (Fexofenadine-Pseudoephedrine) .... One By Mouth Two Times A Day 7)  Klonopin  0.5 Mg  Tabs (Clonazepam ) .... Take One Tablet Two Times A Day As Needed For Anxiety 8)  Protonix  40 Mg Tbec (Pantoprazole  Sodium) .... One By Mouth Daily 9)  Ventolin  Hfa 108 (90 Base) Mcg/act Aers (Albuterol  Sulfate) .... 9 Gm Mdi For $9 2 Puffs Qid As Needed For Wheeze 10)  Meloxicam  7.5 Mg Tabs (Meloxicam ) .... Take One By Mouth Daily With Food For Knee Pain 11)  Amoxicillin  500 Mg Tabs (Amoxicillin ) .... One Tab By Mouth Two Times A Day X 10 Days  Allergies (verified): 1)  ! Sulfa  Past  History:  Past Medical History: Last updated: 07/13/2007 HLD DM Anxiety Asthma Allergic Rhinitis 2D ECHO 10/99 = EF 55% Negative r/o MI w/u 5/06  Past Surgical History: Last updated: 08/28/2006 05/07/06 ldl: 79 direct - 05/09/2006, Appendectomy - 07/05/2004, Bone spur removal left foot - 07/05/2004, Cholecystectomy - 07/05/2004, Colonoscopy-polypectomy - 07/01/1994, Hysterectomy-complete - 07/05/2004, Right carpal tunnel release - 07/05/2004, Right knee surg x2 - 07/05/2004, UGI Series w/ SB followthru-mild GERD - 01/30/1999  Review of Systems       See HPI  Physical Exam  General:  Well-developed,well-nourished,in no acute distress; alert,appropriate and cooperative throughout examination Head:  Normocephalic and atraumatic without obvious abnormalities.  Eyes:  No corneal or conjunctival inflammation noted. EOMI. Perrla.  Ears:  External ear exam shows no significant lesions or deformities.  Otoscopic examination reveals clear canals, tympanic membranes are intact bilaterally without bulging, retraction, inflammation or discharge. Hearing is grossly normal bilaterally. Nose:  External nasal examination shows no deformity or inflammation. Nasal mucosa are erythematous and moist but without lesions or exudates. Mouth:  Oral mucosa and oropharynx without lesions or exudates.  Mild oropharyngeal erythema. Neck:  No deformities, masses, or tenderness noted. Lungs:  Normal respiratory effort, chest expands symmetrically. Lungs are clear to auscultation, no crackles or wheezes. Heart:  Normal rate and regular rhythm. S1 and S2 normal without gallop, murmur,  click, rub or other extra sounds. Abdomen:  Bowel sounds positive,abdomen soft and non-tender without masses, organomegaly or hernias noted. Skin:  Intact without suspicious lesions or rashes   Impression & Recommendations:  Problem # 1:  SORE THROAT (ICD-462) Assessment New Rapid strep positive, tx with amox.  Strep handout/instructions given.     Her updated medication list for this problem includes:    Amoxicillin  500 Mg Tabs (Amoxicillin ) ..... One tab by mouth two times a day x 10 days  Orders: Rapid Strep-[facility] (12569) [facility]- Est Level  3 (00786)  Complete Medication List: 1)  Bayer Childrens Aspirin  81 Mg Chew (Aspirin ) .... Take 1 tablet by mouth once a day 2)  Crestor  10 Mg Tabs (Rosuvastatin  calcium ) .... Take 1 tablet by mouth at bedtime 3)  Metformin  Hcl 1000 Mg Tabs (Metformin  hcl) .... Take 1 tablet by mouth two times a day 4)  Nasacort  Aq 55 Mcg/act Aers (Triamcinolone  acetonide(nasal)) .... Inhale 2 sprays in each nostril as directed once a day 5)  Celexa  40 Mg Tabs (Citalopram  hydrobromide) .... Take one tab by mouth daily 6)  Allegra-d 12 Hour 60-120 Mg Xr12h-tab (Fexofenadine-pseudoephedrine) .... One by mouth two times a day 7)  Klonopin  0.5 Mg Tabs (Clonazepam ) .... Take one tablet two times a day as needed for anxiety 8)  Protonix  40 Mg Tbec (Pantoprazole  sodium) .... One by mouth daily 9)  Ventolin  Hfa 108 (90 Base) Mcg/act Aers (Albuterol  sulfate) .... 9 gm mdi for $9 2 puffs qid as needed for wheeze 10)  Meloxicam  7.5 Mg Tabs (Meloxicam ) .... Take one by mouth daily with food for knee pain 11)  Amoxicillin  500 Mg Tabs (Amoxicillin ) .... One tab by mouth two times a day x 10 days Prescriptions: AMOXICILLIN  500 MG TABS (AMOXICILLIN ) One tab by mouth two times a day x 10 days  #20 x 0   Entered and Authorized by:   Debby Petties MD   Signed by:   Debby Petties MD on 06/08/2009   Method used:   Print then Give to Patient   RxID:   [id number]   Laboratory Results  Date/Time Received: June 08, 2009 8:39 AM  Date/Time Reported: June 08, 2009 8:48 AM   Other Tests  Rapid Strep: positive Comments: ............test performed by...........SABRA Arland Morel, CMA .............entered by...........SABRABonnie A. Jordan, MLS (ASCP)cm

## 2010-07-31 NOTE — Letter (Signed)
Summary: Handout Printed  Printed Handout:  - Strep Throat, Child 

## 2010-07-31 NOTE — Assessment & Plan Note (Signed)
Summary: flu shot,tcb  Nurse Visit   Vital Signs:  Patient profile:   60 year old female Temp:     46. degrees F  Vitals Entered By: Theresia Lo RN (April 06, 2010 11:25 AM)  Allergies: 1)  ! Sulfa  Immunizations Administered:  Influenza Vaccine # 1:    Vaccine Type: Fluvax 3+    Site: left deltoid    Mfr: GlaxoSmithKline    Dose: 0.5 ml    Route: IM    Given by: Theresia Lo RN    Exp. Date: 12/26/2010    Lot #: [id number]    VIS given: 01/23/10 version given April 06, 2010.  Flu Vaccine Consent Questions:    Do you have a history of severe allergic reactions to this vaccine? no    Any prior history of allergic reactions to egg and/or gelatin? no    Do you have a sensitivity to the preservative Thimersol? no    Do you have a past history of Guillan-Barre Syndrome? no    Do you currently have an acute febrile illness? no    Have you ever had a severe reaction to latex? no    Vaccine information given and explained to patient? yes    Are you currently pregnant? no  Orders Added: 1)  Flu Vaccine 36yrs + [90658] 2)  Admin 1st Vaccine [09323]

## 2010-07-31 NOTE — Progress Notes (Signed)
  Phone Note Call from Patient   Caller: Patient Call For: [phone] Summary of Call: Pt want a call back regarding lab results and referral to Dental Clinic Initial call taken by: Abundio Miu,  May 02, 2010 1:41 PM  Follow-up for Phone Call        Can I let her know her Labs were normal? Follow-up by: Jone Baseman CMA,  May 02, 2010 2:38 PM  Additional Follow-up for Phone Call Additional follow up Details #1::        yes sorry I didn't call can you also tell her that the dental referral is in process.  Additional Follow-up by: Antoine Primas DO,  May 02, 2010 2:54 PM    Additional Follow-up for Phone Call Additional follow up Details #2::    Pt informed Follow-up by: Jone Baseman CMA,  May 02, 2010 3:54 PM

## 2010-07-31 NOTE — Assessment & Plan Note (Signed)
Summary: F/U [facility]   Vital Signs:  Patient profile:   60 year old female Height:      63 inches Weight:      169.8 pounds BMI:     30.19 Temp:     98.6 degrees F oral Pulse rate:   84 / minute BP sitting:   136 / 88  (left arm) Cuff size:   regular  Vitals Entered By: Garen Grams LPN (May 16, 2010 2:42 PM) CC: f/u RA Is Patient Diabetic? Yes Did you bring your meter with you today? No   Primary Care Provider:  Antoine Primas DO  CC:  f/u RA.  History of Present Illness: 1. RA-  .  Pt states the pain in her jointsabout the same but shoulder injections did not help at all this last time.  Has been on same dose no side effects except maybe dry skin.  Pt is also taking the percocet wich helps.   Pt states recently having worse pain hates prednisone but in enough pain would consider it. Pt states easy chores like combing her hair is giving her problems.   2.  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  Pt is in the process of getting medicaid and SSI.  Syas it is a scary process.    3. DM   glucose low:100 Glucose high:140 Last A1C:8.0 Taking Meds:yes On INsulin:no Side effects:no ROS: Denies polyuria, polydipsia, visual changes numbness in extremities, foot ulcers Lifestyle modifications: has been trying to eat better.  Pt was supposed to be referred to an opthomologist but is still having  to wait.  Pt does have [employer] now.    4.  Allergic rhinitis- Pt has been taking zyrtec and it has been helping somewhat.   Pt will go to [facility] outpt pharm to pick up med.    Habits & Providers  Alcohol-Tobacco-Diet     Tobacco Status: quit     Year Quit: 16 years  Current Medications (verified): 1)  Bayer Childrens Aspirin 81 Mg Chew (Aspirin) .... Take 1 Tablet By Mouth Once A Day 2)  Crestor 10 Mg Tabs (Rosuvastatin Calcium) .... Take 1 Tablet By Mouth At Bedtime 3)  Metformin Hcl 1000 Mg Tabs  (Metformin Hcl) .... Take 1 Tablet By Mouth Two Times A Day 4)  Nasacort Aq 55 Mcg/act Aers (Triamcinolone Acetonide(Nasal)) .... Inhale 2 Sprays in Each Nostril As Directed Once A Day 5)  Celexa 40 Mg  Tabs (Citalopram Hydrobromide) .... Take One Tab By Mouth Daily 6)  Clarinex-D 24 Hour 5-240 Mg Xr24h-Tab (Desloratadine-Pseudoephedrine) .... One By Mouth Daily For Allergies 7)  Protonix 40 Mg Tbec (Pantoprazole Sodium) .... One By Mouth Daily 8)  Ventolin Hfa 108 (90 Base) Mcg/act Aers (Albuterol Sulfate) .... 9 Gm Mdi For $9 2 Puffs Qid As Needed For Wheeze 9)  Percocet 10-325 Mg Tabs (Oxycodone-Acetaminophen) .Marland Kitchen.. 1 By Mouth Q6hr As Needed Severe Pain. 10)  Nitrostat 0.4 Mg Subl (Nitroglycerin) .... One By Mouth As Needed For Chest Pain; May Repeat 2 More Times Every 5 Minutes; If Chest Pain Does Not Resolve After Three Doses, Call 911 11)  Methotrexate 2.5 Mg Tabs (Methotrexate Sodium) .... 8 Tabs By Mouth Every Friday.  Disp 1 Mo Supply 12)  Folic Acid 1 Mg Tabs (Folic Acid) .Marland Kitchen.. 1 By Mouth Once Daily Every Day Except Friday. 13)  Klonopin 0.5 Mg Tabs (Clonazepam) .Marland Kitchen.. 1 Tab  By Mouthtid As Needed For Anxiety 14)  Zyrtec Allergy 10 Mg Caps (Cetirizine Hcl) .Marland Kitchen.. 1 Tab At Bedtime 15)  Prednisone 20 Mg Tabs (Prednisone) .... Take 2 Tabs By Mouth Daily For The Next 5 Days  Allergies (verified): 1)  ! Sulfa  Past History:  Past medical, surgical, family and social histories (including risk factors) reviewed, and no changes noted (except as noted below).  Past Medical History: Reviewed history from 01/08/2010 and no changes required. RA- CCP positive, will be followed at [facility] HLD DM Anxiety Asthma Allergic Rhinitis 2D ECHO 10/99 = EF 55% Negative r/o MI w/u 11/12/22 L rotator cuff tear (old) - diagnosed by MRI 2/11 - steroid injection 08/30/09 negative r/u MI w/u 2/11  Past Surgical History: Reviewed history from 08/23/2009 and no changes required. Appendectomy - 07/05/2004 Bone spur  removal left foot - 07/05/2004 Cholecystectomy - 07/05/2004 Colonoscopy-polypectomy - 07/01/1994 Hysterectomy-complete - 07/05/2004 Right carpal tunnel release - 07/05/2004 Right knee surg x2 - 07/05/2004 UGI Series w/ SB followthru-mild GERD - 01/30/1999  Family History: Reviewed history from 12/14/2009 and no changes required. Dad died at 80 2/2 CVA, MI; he also had DM,  Mom died at 102 2/2 complications associated w/ PNA, One brother healthy (70),  One brother with inoperable lung CA (49)-died 2022/11/12, One sister with DM (29), One sister with RA (56) treated with plaquenil, prednisone, MTX Three kids all healthy  Social History: Reviewed history from 11/20/2009 and no changes required. 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 On the MAP program. Lives in trailer. Uses wood for heat. Stressful family situation. 61 yo dtr just had a baby 08/30/09. Lives with Morrie Sheldon (oldest grandchild) and her husband. Morrie Sheldon has intellectual disability  Review of Systems       denies fever, chills, nausea, vomiting, diarrhea or constipation   Physical Exam  General:  vitals signs reviewed -- WNL.   obese.  well hydrated Eyes:  conjunctivae pink, sclerae clear  Nose:  External nasal examination shows no deformity or inflammation. Nasal mucosa are erythematous and moist but without lesions or exudates. Mouth:  oropharynx pink, moist; no erythema  Lungs:  work of breathing unlabored, clear to auscultation bilaterally; no wheezes, rales, or ronchi; good air movement throughout  Heart:  regular rate and rhythm, no murmurs; normal s1/s2  Abdomen:  soft, non-tender, normal bowel sounds, and no distention.   Msk:  hands have no swelling b/l, mildly tender to palpation b/l full ROM, mild crepitus of wrist joint on right.   tender to palpation in all joints today, seems very uncomfortable with movement,  mor ethan usual.  Extremities:  no edema Skin:  pt has worsening dry skin everywhere, no exfoliation seen,     Impression & Recommendations:  Problem # 1:  RHEUMATOID ARTHRITIS (ICD-714.0) not improving anymore ans seemds to e in a flare.  will give prednisione and increase to max doese of MTX.  Talked to pt at length about feeling uncomfortable with any other medication such as plaquenil that I should manage and that she should be seen by her rheumatologist.  Pt states she would probably not go and then we would be leavingin her high and dry.  Pt understands though if this does not work she will be seen again.   Pt is taking folic acid.  Continue percocet.  Her updated medication list  for this problem includes:    Bayer Childrens Aspirin 81 Mg Chew (Aspirin) .Marland Kitchen... Take 1 tablet by mouth once a day    Methotrexate 2.5 Mg Tabs (Methotrexate sodium) .Marland Kitchen... 8 tabs by mouth every friday.  disp 1 mo supply    Prednisone 20 Mg Tabs (Prednisone) .Marland Kitchen... Take 2 tabs by mouth daily for the next 5 days  Orders: [facility]- Est  Level 4 (16109)  Problem # 2:  ANXIETY (ICD-300.00) Continue current regimen seems like it is helping.  Her updated medication list for this problem includes:    Celexa 40 Mg Tabs (Citalopram hydrobromide) .Marland Kitchen... Take one tab by mouth daily    Klonopin 0.5 Mg Tabs (Clonazepam) .Marland Kitchen... 1 tab by mouthtid as needed for anxiety  Orders: [facility]- Est  Level 4 (60454)  Problem # 3:  RHINITIS, ALLERGIC (ICD-477.9) Continue current regimen  Her updated medication list for this problem includes:    Nasacort Aq 55 Mcg/act Aers (Triamcinolone acetonide(nasal)) ..... Inhale 2 sprays in each nostril as directed once a day    Zyrtec Allergy 10 Mg Caps (Cetirizine hcl) .Marland Kitchen... 1 tab at bedtime  Orders: [facility]- Est  Level 4 (09811)  Problem # 4:  DIABETES MELLITUS II, UNCOMPLICATED (ICD-250.00)  Her updated medication list for this problem includes:    Bayer Childrens Aspirin 81 Mg Chew (Aspirin) .Marland Kitchen...  Take 1 tablet by mouth once a day    Metformin Hcl 1000 Mg Tabs (Metformin hcl) .Marland Kitchen... Take 1 tablet by mouth two times a day  Complete Medication List: 1)  Bayer Childrens Aspirin 81 Mg Chew (Aspirin) .... Take 1 tablet by mouth once a day 2)  Crestor 10 Mg Tabs (Rosuvastatin calcium) .... Take 1 tablet by mouth at bedtime 3)  Metformin Hcl 1000 Mg Tabs (Metformin hcl) .... Take 1 tablet by mouth two times a day 4)  Nasacort Aq 55 Mcg/act Aers (Triamcinolone acetonide(nasal)) .... Inhale 2 sprays in each nostril as directed once a day 5)  Celexa 40 Mg Tabs (Citalopram hydrobromide) .... Take one tab by mouth daily 6)  Clarinex-d 24 Hour 5-240 Mg Xr24h-tab (Desloratadine-pseudoephedrine) .... One by mouth daily for allergies 7)  Protonix 40 Mg Tbec (Pantoprazole sodium) .... One by mouth daily 8)  Ventolin Hfa 108 (90 Base) Mcg/act Aers (Albuterol sulfate) .... 9 gm mdi for $9 2 puffs qid as needed for wheeze 9)  Percocet 10-325 Mg Tabs (Oxycodone-acetaminophen) .Marland Kitchen.. 1 by mouth q6hr as needed severe pain. 10)  Nitrostat 0.4 Mg Subl (Nitroglycerin) .... One by mouth as needed for chest pain; may repeat 2 more times every 5 minutes; if chest pain does not resolve after three doses, call 911 11)  Methotrexate 2.5 Mg Tabs (Methotrexate sodium) .... 8 tabs by mouth every friday.  disp 1 mo supply 12)  Folic Acid 1 Mg Tabs (Folic acid) .Marland Kitchen.. 1 by mouth once daily every day except friday. 13)  Klonopin 0.5 Mg Tabs (Clonazepam) .Marland Kitchen.. 1 tab by mouthtid as needed for anxiety 14)  Zyrtec Allergy 10 Mg Caps (Cetirizine hcl) .Marland Kitchen.. 1 tab at bedtime 15)  Prednisone 20 Mg Tabs (Prednisone) .... Take 2 tabs by mouth daily for the next 5 days Prescriptions: PERCOCET 10-325 MG TABS (OXYCODONE-ACETAMINOPHEN) 1 by mouth q6hr as needed severe pain.  #124 x 0   Entered and Authorized by:   Antoine Primas DO   Signed by:   Antoine Primas DO on 05/16/2010   Method used:   Print then Give to Patient   RxID:  [id number] PREDNISONE 20 MG TABS (PREDNISONE) take 2 tabs by mouth daily for the next 5 days  #10 x 0   Entered and Authorized by:   Antoine Primas DO   Signed by:   Antoine Primas DO on 05/16/2010   Method used:   Print then Give to Patient   RxID:   [id number] KLONOPIN 0.5 MG TABS (CLONAZEPAM) 1 tab by mouthtid as needed for anxiety  #93 x 0   Entered and Authorized by:   Antoine Primas DO   Signed by:   Antoine Primas DO on 05/16/2010   Method used:   Print then Give to Patient   RxID:   [id number] METHOTREXATE 2.5 MG TABS (METHOTREXATE SODIUM) 8 tabs by mouth every friday.  disp 1 mo supply  #32 x 0   Entered and Authorized by:   Antoine Primas DO   Signed by:   Antoine Primas DO on 05/16/2010   Method used:   Print then Give to Patient   RxID:   [id number] PERCOCET 10-325 MG TABS (OXYCODONE-ACETAMINOPHEN) 1 by mouth q6hr as needed severe pain.  #93 x 0   Entered and Authorized by:   Antoine Primas DO   Signed by:   Antoine Primas DO on 05/16/2010   Method used:   Print then Give to Patient   RxID:   [id number] PERCOCET 10-325 MG TABS (OXYCODONE-ACETAMINOPHEN) 1 by mouth q6hr as needed severe pain.  #93 x 0   Entered and Authorized by:   Antoine Primas DO   Signed by:   Antoine Primas DO on 05/16/2010   Method used:   Print then Give to Patient   RxID:   [id number] KLONOPIN 0.5 MG TABS (CLONAZEPAM) 1 tab by mouthtid as needed for anxiety  #93 x 0   Entered and Authorized by:   Antoine Primas DO   Signed by:   Antoine Primas DO on 05/16/2010   Method used:   Print then Give to Patient   RxID:   [id number] PREDNISONE 20 MG TABS (PREDNISONE) take 2 tabs by mouth daily for the next 5 days  #10 x 0   Entered and Authorized by:   Antoine Primas DO   Signed by:   Antoine Primas DO on 05/16/2010   Method used:   Print then Give to Patient   RxID:   [id number] METHOTREXATE 2.5 MG TABS (METHOTREXATE SODIUM) 8 tabs by mouth every friday.  disp 1 mo  supply  #32 x 0   Entered and Authorized by:   Antoine Primas DO   Signed by:   Antoine Primas DO on 05/16/2010   Method used:   Print then Give to Patient   RxID:   [id number]    Orders Added: 1)  [facility]- Est  Level 4 [71696]

## 2010-07-31 NOTE — Letter (Signed)
Summary: Disability determination services  Disability determination services   Imported By: Marily Memos 05/29/2010 13:30:09  _____________________________________________________________________  External Attachment:    Type:   Image     Comment:   External Document

## 2010-07-31 NOTE — Progress Notes (Signed)
Summary: triage  Phone Note Call from Patient Call back at [phone]   Caller: Patient Summary of Call: Pt coughing real bad and was told that if she was not any better that an antibotic could be called in for her.  Pt uses [facility]. Please be careful of the price. Initial call taken by: Clydell Hakim,  July 10, 2009 9:57 AM  Follow-up for Phone Call        coughing x 2 weeks. states the md told her he would call in an antibiotic if she did not get better. asked that md do this today. told her he was not here but I will send him the message & call her with his response Follow-up by: [name] RN,  July 10, 2009 9:59 AM  Additional Follow-up for Phone Call Additional follow up Details #1::        sent in. please call pt to advise. thanks.  Additional Follow-up by: Myrtie Soman  MD,  July 10, 2009 10:14 AM    New/Updated Medications: AZITHROMYCIN 500 MG TABS (AZITHROMYCIN) one by mouth daily for 5 days Prescriptions: AZITHROMYCIN 500 MG TABS (AZITHROMYCIN) one by mouth daily for 5 days  #5 x 0   Entered and Authorized by:   Myrtie Soman  MD   Signed by:   Myrtie Soman  MD on 07/10/2009   Method used:   Electronically to        [facility] (retail)       [address]       [place], [place]  [address]       Ph: [phone]       Fax: [phone]   RxID:   [id number]

## 2010-07-31 NOTE — Progress Notes (Signed)
Summary: meds prob  Phone Note Call from Patient Call back at [phone]   Caller: Patient Summary of Call: pt states that all the meds that Dr Katrinka Blazing called in on 10/[facility] - pls change and resend Initial call taken by: De Nurse,  May 10, 2010 11:52 AM  Follow-up for Phone Call        [facility] and [facility] sent in 04/26/2010. message left on voicemail at [facility] to return call.  patient notifid that we will call in RX to [facility] pharmacy as soon as I can get in touch with them. Follow-up by: Theresia Lo RN,  May 10, 2010 1:48 PM  Additional Follow-up for Phone Call Additional follow up Details #1::        rx called to [facility] . patient notified. Additional Follow-up by: Theresia Lo RN,  May 10, 2010 2:08 PM

## 2010-07-31 NOTE — Progress Notes (Signed)
Summary: phn msg  Phone Note Call from Patient Call back at [phone]   Caller: Patient Summary of Call: Pt wants to know if Dr. Rexene Alberts will work her in tomorrow for a shot of cortizone? Initial call taken by: Clydell Hakim,  October 16, 2009 4:59 PM  Follow-up for Phone Call        need to know what joint. If L shoulder it's too soon (last 08/30/09 and have to wait 3 months). otherwise we can do it tomorrow or thursday am in geri clinic. please call pt to advise. thanks.  Follow-up by: Myrtie Soman  MD,  October 17, 2009 8:47 AM  Additional Follow-up for Phone Call Additional follow up Details #1::        Patient states it the right shoulder now, placed in [facility] schedule tommorrow at 3 Additional Follow-up by: Garen Grams LPN,  October 17, 2009 9:18 AM

## 2010-07-31 NOTE — Miscellaneous (Signed)
  Clinical Lists Changes  Problems: Removed problem of DENTAL PAIN (ICD-525.9) Removed problem of KNEE PAIN, CHRONIC (ICD-719.46) Removed problem of POLYARTHRITIS (ICD-719.49) Removed problem of ENCOUNTER FOR THERAPEUTIC DRUG MONITORING (ICD-V58.83)

## 2010-07-31 NOTE — Assessment & Plan Note (Signed)
Summary: f/up,tcb   Vital Signs:  Patient profile:   60 year old female Height:      63 inches Weight:      171 pounds BMI:     30.40 Temp:     98.4 degrees F oral Pulse rate:   87 / minute BP sitting:   124 / 82  (left arm) Cuff size:   regular  Vitals Entered By: Tessie Fass CMA (March 07, 2010 9:45 AM) CC: F/U Is Patient Diabetic? Yes   Primary Care Provider:  Antoine Primas DO  CC:  F/U.  History of Present Illness: 60 yo female here for f/u on multiple issues.  RA:: Pt has been taking the MTX, folate and percocet. Pt though has stopped taking the MTx and folate after seeing specialist at [facility], she states the specialist is not sure that this is RA.  Pt states the pain has been doing well with the percocet but she has been doing more activity recently because watching her grandchildren due to her daughter being admitted to behavioral health.   DM- Pt due for A1C.  Pt though was doing well last A1C was elevated at 8.4 but had a lot of prednisone.  Pt though is adamit that she does not want any more medicine.    Anxiety- Having to use more of her klonopin recently due to the new responsibilities that she has and being worried about her daughter  Allergies-  been having red eyes, runny nose and feels like something in her throat.  Denies fever, chills, nausea, vomiting, diarrhea or constipation no sick contacts, feels fine otherwise.  No trauma to eye.    Current Medications (verified): 1)  Bayer Childrens Aspirin 81 Mg Chew (Aspirin) .... Take 1 Tablet By Mouth Once A Day 2)  Crestor 10 Mg Tabs (Rosuvastatin Calcium) .... Take 1 Tablet By Mouth At Bedtime 3)  Metformin Hcl 1000 Mg Tabs (Metformin Hcl) .... Take 1 Tablet By Mouth Two Times A Day 4)  Nasacort Aq 55 Mcg/act Aers (Triamcinolone Acetonide(Nasal)) .... Inhale 2 Sprays in Each Nostril As Directed Once A Day 5)  Celexa 40 Mg  Tabs (Citalopram Hydrobromide) .... Take One Tab By Mouth Daily 6)  Clarinex-D  24 Hour 5-240 Mg Xr24h-Tab (Desloratadine-Pseudoephedrine) .... One By Mouth Daily For Allergies 7)  Protonix 40 Mg Tbec (Pantoprazole Sodium) .... One By Mouth Daily 8)  Ventolin Hfa 108 (90 Base) Mcg/act Aers (Albuterol Sulfate) .... 9 Gm Mdi For $9 2 Puffs Qid As Needed For Wheeze 9)  Percocet 10-325 Mg Tabs (Oxycodone-Acetaminophen) .Marland Kitchen.. 1 By Mouth Q6hr As Needed Severe Pain. 10)  Nitrostat 0.4 Mg Subl (Nitroglycerin) .... One By Mouth As Needed For Chest Pain; May Repeat 2 More Times Every 5 Minutes; If Chest Pain Does Not Resolve After Three Doses, Call 911 11)  Methotrexate 2.5 Mg Tabs (Methotrexate Sodium) .... 3 Tabs By Mouth Every Friday.  Disp 1 Mo Supply 12)  Folic Acid 1 Mg Tabs (Folic Acid) .Marland Kitchen.. 1 By Mouth Once Daily Every Day Except Friday. 13)  Klonopin 0.5 Mg Tabs (Clonazepam) .Marland Kitchen.. 1 Tab By Mouthtid As Needed For Anxiety 14)  Zyrtec Allergy 10 Mg Caps (Cetirizine Hcl) .Marland Kitchen.. 1 Tab At Bedtime  Allergies (verified): 1)  ! Sulfa  Past History:  Past medical, surgical, family and social histories (including risk factors) reviewed, and no changes noted (except as noted below).  Past Medical History: Reviewed history from 01/08/2010 and no changes required. RA- CCP positive, will  be [facility] at [facility], [facility], [facility] HLD DM Anxiety Asthma Allergic Rhinitis 2D ECHO 10/99 = EF 55% Negative r/o MI w/u 2022-11-23 L rotator cuff tear (old) - diagnosed by MRI 2/11 - steroid injection 08/30/09 negative r/u MI w/u 2/11  Past Surgical History: Reviewed history from 08/23/2009 and no changes required. Appendectomy - 07/05/2004 Bone spur removal left foot - 07/05/2004 Cholecystectomy - 07/05/2004 Colonoscopy-polypectomy - 07/01/1994 Hysterectomy-complete - 07/05/2004 Right carpal tunnel release - 07/05/2004 Right knee surg x2 - 07/05/2004 UGI Series w/ SB followthru-mild GERD - 01/30/1999  Family History: Reviewed history from 12/14/2009 and no changes required. Dad died at 75 2/2 CVA, MI; he also had DM,  Mom died  at 34 2/2 complications associated w/ PNA, One brother healthy (50),  One brother with inoperable lung CA (49)-died 11/23/2022, One sister with DM (66), One sister with RA (56) treated with plaquenil, prednisone, MTX Three kids all healthy  Social History: Reviewed history from 11/20/2009 and no changes required. 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 On the MAP program. Lives in trailer. Uses wood for heat. Stressful family situation. 61 yo dtr just had a baby 08/30/09. Lives with Morrie Sheldon (oldest grandchild) and her husband. Morrie Sheldon has intellectual disability  Review of Systems       see hpi  Physical Exam  General:  vitals signs reviewed -- WNL except for mild tachycardia.  appears uncomfortable.  well devoloped.  well hydrated Eyes:  conjunctivae pink, sclerae clear  Mouth:  oropharynx pink, moist; no erythema or exudate mild PND Lungs:  work of breathing unlabored, clear to auscultation bilaterally; no wheezes, rales, or ronchi; good air movement throughout  Heart:  regular rate and rhythm, no murmurs; normal s1/s2  Abdomen:  soft, non-tender, normal bowel sounds, and no distention.   Pulses:  DP and radial pulses 2+ bilaterally  Extremities:  no edema Skin:  Intact without suspicious lesions or rashes   Impression & Recommendations:  Problem # 1:  RHEUMATOID ARTHRITIS (ICD-714.0) Seems stable not on any prednisione only percocet.  Followed by specialist. The following medications were removed from the medication list:    Prednisone 20 Mg Tabs (Prednisone) .Marland Kitchen... Take 2 tabs daily for next 7 days Her updated medication list for this problem includes:    Bayer Childrens Aspirin 81 Mg Chew (Aspirin) .Marland Kitchen... Take 1 tablet by mouth once a day    Methotrexate 2.5 Mg Tabs (Methotrexate sodium) .Marland KitchenMarland KitchenMarland KitchenMarland Kitchen 3 tabs by mouth every friday.  disp 1 mo supply  Orders: [facility]- Est   Level 4 ([address]237)  Problem # 2:  DIABETES MELLITUS II, UNCOMPLICATED (ICD-250.00) Not at goal will await until next A1C made deal with pt that if next A1C is still above 8 then another med may need to be added, likely would be glipizide, told to keep log and bring to next appointment.  Her updated medication list for this problem includes:    Bayer Childrens Aspirin 81 Mg Chew (Aspirin) .Marland Kitchen... Take 1 tablet by mouth once a day    Metformin Hcl 1000 Mg Tabs (Metformin hcl) .Marland Kitchen... Take 1 tablet by mouth two times a day  Orders: [facility]- Est  Level 4 ([address]31)  Problem # 3:  ANXIETY (ICD-300.00) increased klonopin to three times a day dosing.  Her updated medication list for this problem includes:    Celexa 40 Mg Tabs (Citalopram hydrobromide) .Marland KitchenMarland KitchenMarland KitchenMarland Kitchen  Take one tab by mouth daily    Klonopin 0.5 Mg Tabs (Clonazepam) .Marland Kitchen... 1 tab by mouthtid as needed for anxiety  Problem # 4:  RHINITIS, ALLERGIC (ICD-477.9) will try zyrtec gave red flags  Her updated medication list for this problem includes:    Nasacort Aq 55 Mcg/act Aers (Triamcinolone acetonide(nasal)) ..... Inhale 2 sprays in each nostril as directed once a day    Zyrtec Allergy 10 Mg Caps (Cetirizine hcl) .Marland Kitchen... 1 tab at bedtime  Orders: [facility]- Est  Level 4 ([address])  Complete Medication List: 1)  Bayer Childrens Aspirin 81 Mg Chew (Aspirin) .... Take 1 tablet by mouth once a day 2)  Crestor 10 Mg Tabs (Rosuvastatin calcium) .... Take 1 tablet by mouth at bedtime 3)  Metformin Hcl 1000 Mg Tabs (Metformin hcl) .... Take 1 tablet by mouth two times a day 4)  Nasacort Aq 55 Mcg/act Aers (Triamcinolone acetonide(nasal)) .... Inhale 2 sprays in each nostril as directed once a day 5)  Celexa 40 Mg Tabs (Citalopram hydrobromide) .... Take one tab by mouth daily 6)  Clarinex-d 24 Hour 5-240 Mg Xr24h-tab (Desloratadine-pseudoephedrine) .... One by mouth daily for allergies 7)  Protonix 40 Mg Tbec (Pantoprazole sodium) .... One by mouth daily 8)  Ventolin Hfa  108 (90 Base) Mcg/act Aers (Albuterol sulfate) .... 9 gm mdi for $9 2 puffs qid as needed for wheeze 9)  Percocet 10-325 Mg Tabs (Oxycodone-acetaminophen) .Marland Kitchen.. 1 by mouth q6hr as needed severe pain. 10)  Nitrostat 0.4 Mg Subl (Nitroglycerin) .... One by mouth as needed for chest pain; may repeat 2 more times every 5 minutes; if chest pain does not resolve after three doses, call 911 11)  Methotrexate 2.5 Mg Tabs (Methotrexate sodium) .... 3 tabs by mouth every friday.  disp 1 mo supply 12)  Folic Acid 1 Mg Tabs (Folic acid) .Marland Kitchen.. 1 by mouth once daily every day except friday. 13)  Klonopin 0.5 Mg Tabs (Clonazepam) .Marland Kitchen.. 1 tab by mouthtid as needed for anxiety 14)  Zyrtec Allergy 10 Mg Caps (Cetirizine hcl) .Marland Kitchen.. 1 tab at bedtime  Patient Instructions: 1)  Try Zyrtec for your allegies 2)  Refresh eye drops for your dry eyes 3)  I am giving you prescription for klonopin and percocet 4)  I need to see you again in 1 month.  Prescriptions: KLONOPIN 0.5 MG TABS (CLONAZEPAM) 1 tab by mouthtid as needed for anxiety  #90 x 0   Entered and Authorized by:   Antoine Primas DO   Signed by:   Antoine Primas DO on 03/07/2010   Method used:   [facility] to ...       [facility]  [name] [phone][facility] (retail[facility]       [address]       [place], [place]  [id number]       Ph: [phone] or [phone]       Fax: [phone]   RxID:   [id number] PERCOCET 10-325 MG TABS (OXYCODONE-ACETAMINOPHEN) 1 by mouth q6hr as needed severe pain.  #60 x 0   Entered and Authorized by:   Antoine Primas DO   Signed by:   Antoine Primas DO on 03/07/2010   Method used:   Print then Give to Patient   RxID:   [id number] KLONOPIN 0.5 MG TABS (CLONAZEPAM) 1 tab by mouth two times a day as needed for anxiety  #90 x 0   Entered and Authorized by:   Earna Coder  Katrinka Blazing DO   Signed by:   Antoine Primas DO on 03/07/2010   Method used:   Print then Give to Patient   RxID:   [id number] ZYRTEC ALLERGY 10  MG CAPS (CETIRIZINE HCL) 1 tab at bedtime  #31 x 3   Entered and Authorized by:   Antoine Primas DO   Signed by:   Antoine Primas DO on 03/07/2010   Method used:   [facility]        [facility]  [name] [phone][facility] (retail)       [address]       [place], [place]  [id number]       Ph: [phone] or [phone]       Fax: [phone]   RxID:   [id number]

## 2010-07-31 NOTE — Assessment & Plan Note (Signed)
Summary: hfu,df   Vital Signs:  Patient profile:   60 year old female Height:      63 inches Weight:      167.6 pounds BMI:     29.80 Temp:     97.9 degrees F oral Pulse rate:   82 / minute BP sitting:   125 / 85  (left arm) Cuff size:   regular  Vitals Entered By: Garen Grams LPN (August 23, 2009 9:26 AM) CC: HFU Is Patient Diabetic? Yes Did you bring your meter with you today? No Pain Assessment Patient in pain? yes     Location: rt hand   Primary Care Provider:  Myrtie Soman  MD  CC:  HFU.  History of Present Illness: 1. L shoulder pain - hospital follow-up  Admitted to [facility] 2/14 for L shoulder pain concerning for cardiac etiology. ruled out with CEs. Diagnosed with partial rotator cuff tear in left shoulder per MRI done in the hospital. MRI also showed evidence of bursitis and shoulder joint effusion. Also showed increased fluid in the labrum which may represent a tear.  L shoulder feels much improved and she has been using it consistently to perform her ADLs. . Is taking percocet for the pain currently along with ibuprfen 800 mg three times a day. Pt open to steroid injection but not today due to problem #2 (see below)  2. R hand and wrist pain Started monday evening (2 days ago). Noticed pain when she lifted her middle finger. Had trouble sleeping that night. Has developed swelling in the fingers and wrist with some warmth and redness. No trauma fall or other known injury. Significant pain with any wrist movement. Is R handed and this has interfered with ADLs significantly. Had previous carpal tunnel surgery on that hand.   Has a sister with severe RA.     Habits & Providers  Alcohol-Tobacco-Diet     Tobacco Status: never  -  Date:  08/15/2009    CREAT 0.56    LDL 58    HGB 12.1    TSH 6.045  Current Medications (verified): 1)  Bayer Childrens Aspirin 81 Mg Chew (Aspirin) .... Take 1 Tablet By Mouth Once A Day 2)  Crestor 10 Mg Tabs (Rosuvastatin  Calcium) .... Take 1 Tablet By Mouth At Bedtime 3)  Metformin Hcl 1000 Mg Tabs (Metformin Hcl) .... Take 1 Tablet By Mouth Two Times A Day 4)  Nasacort Aq 55 Mcg/act Aers (Triamcinolone Acetonide(Nasal)) .... Inhale 2 Sprays in Each Nostril As Directed Once A Day 5)  Celexa 40 Mg  Tabs (Citalopram Hydrobromide) .... Take One Tab By Mouth Daily 6)  Allegra-D 12 Hour 60-120 Mg Xr12h-Tab (Fexofenadine-Pseudoephedrine) .... One By Mouth Two Times A Day 7)  Klonopin 0.5 Mg  Tabs (Clonazepam) .... Take One Tablet Two Times A Day As Needed For Anxiety 8)  Protonix 40 Mg Tbec (Pantoprazole Sodium) .... One By Mouth Daily 9)  Ventolin Hfa 108 (90 Base) Mcg/act Aers (Albuterol Sulfate) .... 9 Gm Mdi For $9 2 Puffs Qid As Needed For Wheeze 10)  Meloxicam 7.5 Mg Tabs (Meloxicam) .... Take One By Mouth Daily With Food For Knee Pain 11)  Percocet 5-325 Mg Tabs (Oxycodone-Acetaminophen) .Marland Kitchen.. 1-2 By Mouth Daily For Pain  Allergies (verified): 1)  ! Sulfa  Past History:  Past Medical History: HLD DM Anxiety Asthma Allergic Rhinitis 2D ECHO 10/99 = EF 55% Negative r/o MI w/u 5/06 rotator cuff tear (old) - diagnosed by MRI 2/11 negative r/u MI  w/u 2/11  Past Surgical History: Appendectomy - 07/05/2004 Bone spur removal left foot - 07/05/2004 Cholecystectomy - 07/05/2004 Colonoscopy-polypectomy - 07/01/1994 Hysterectomy-complete - 07/05/2004 Right carpal tunnel release - 07/05/2004 Right knee surg x2 - 07/05/2004 UGI Series w/ SB followthru-mild GERD - February 21, 1999  Family History: Dad died at 73 2/2 CVA, MI; he also had DM,  Mom died at 27 2/2 complications associated w/ PNA, One brother healthy (58),  One brother with inoperable lung CA (49)-died `06, One sister with DM (80), One sister with RA (56),  Three kids all healthy  Review of Systems       no chest pain, SOB, no nausea, vomting, diarrhea   Physical Exam  Additional Exam:  General:  Vital signs reviewed -- overweight but otherwise  normal Alert, appropriate; well-dressed and well-nourished Lungs:  work of breathing unlabored, clear to auscultation bilaterally; no wheezes, rales, or ronchi; good air movement throughout Heart:  regular rate and rhythm, no murmurs; normal s1/s2  MSK:  L shoulder -- + hawkins and empty can. Has painful arc but no drop arm sign. Limited forward abduction to about 160 degrees R wrist -- diffuse soft tissue swelling over the dorsal aspect of the right wrist with mild warmth to palpation. No obvious erythema. Has very limited extension and flexion. Better but still limited lateral and medial deviation. Grip strength 3/5 due to pain. Very painful flexion at about 30 degrees, painful extension at about 30 degrees as well.   Pulses:  DP and radial pulses 2+ bilaterally  Extremities:  no cyanosis, clubbing, or edema Neurologic:  alert and oriented. speech normal.    Impression & Recommendations:  Problem # 1:  SHOULDER PAIN, LEFT (ICD-719.41) Assessment Improved  has rotator cuff tear on MRI. Will defer steroid injection at this point given her dependency on this hand with R wrist pain that has just started. follow-up in one week. Gave rotator cuff exercises and discussed the necessity of preserve ROM in this shoulder. Pt does not have insurance and it will be hard to get ortho to see her in the outpatient setting. Will do the best we can. Continue percocet for now along with ibuprofen. Will  Her updated medication list for this problem includes:    Bayer Childrens Aspirin 81 Mg Chew (Aspirin) .Marland Kitchen... Take 1 tablet by mouth once a day    Meloxicam 7.5 Mg Tabs (Meloxicam) .Marland Kitchen... Take one by mouth daily with food for knee pain    Percocet 5-325 Mg Tabs (Oxycodone-acetaminophen) .Marland Kitchen... 1-2 by mouth daily for pain  Orders: [facility]- Est  Level 4 ([address])  Problem # 2:  WRIST PAIN, RIGHT (UEA-540.98) Assessment: New Unclear etiology. Suspect tendonitis.  Pt quite tender with palpation and ROM on exam. Will  check ESR, CBC with diff, RF, uric acid and ANA. Gave splint to wear and recommended icing.  follow-up in one week.  Orders: CBC w/Diff-[facility] (11914) Sed Rate (ESR)-[facility] [id number]) Rheum Fact-[facility] [phone]) ANA-[facility] [phone]) Splint wrist/thumb- [facility] (X5284) Uric Acid-[facility] ([id number]) [facility]- Est  Level 4 ([address]366)  Complete Medication List: 1)  Bayer Childrens Aspirin 81 Mg Chew (Aspirin) .... Take 1 tablet by mouth once a day 2)  Crestor 10 Mg Tabs (Rosuvastatin calcium) .... Take 1 tablet by mouth at bedtime 3)  Metformin Hcl 1000 Mg Tabs (Metformin hcl) .... Take 1 tablet by mouth two times a day 4)  Nasacort Aq 55 Mcg/act Aers (Triamcinolone acetonide(nasal)) .... Inhale 2 sprays in each nostril as directed once a day 5)  Celexa 40 Mg Tabs (Citalopram hydrobromide) .... Take one tab by mouth daily 6)  Allegra-d 12 Hour 60-120 Mg Xr12h-tab (Fexofenadine-pseudoephedrine) .... One by mouth two times a day 7)  Klonopin 0.5 Mg Tabs (Clonazepam) .... Take one tablet two times a day as needed for anxiety 8)  Protonix 40 Mg Tbec (Pantoprazole sodium) .... One by mouth daily 9)  Ventolin Hfa 108 (90 Base) Mcg/act Aers (Albuterol sulfate) .... 9 gm mdi for $9 2 puffs qid as needed for wheeze 10)  Meloxicam 7.5 Mg Tabs (Meloxicam) .... Take one by mouth daily with food for knee pain 11)  Percocet 5-325 Mg Tabs (Oxycodone-acetaminophen) .Marland Kitchen.. 1-2 by mouth daily for pain  Patient Instructions: 1)  take the percocet as needed 2)  take the ibuprofen as well 3)  start icing three times a day for 20 minutes 4)  follow-up with me in one week 5)  we'll do some bloodwork today Prescriptions: PERCOCET 5-325 MG TABS (OXYCODONE-ACETAMINOPHEN) 1-2 by mouth daily for pain  #30 x 0   Entered and Authorized by:   Myrtie Soman  MD   Signed by:   Myrtie Soman  MD on 08/23/2009   Method used:   Print then Give to Patient   RxID:   [id number] PERCOCET 5-325 MG TABS (OXYCODONE-ACETAMINOPHEN) 1-2 by mouth daily  for pain  #0 x 0   Entered and Authorized by:   Myrtie Soman  MD   Signed by:   Myrtie Soman  MD on 08/23/2009   Method used:   Print then Give to Patient   RxID:   [id number] NASACORT AQ 55 MCG/ACT AERS (TRIAMCINOLONE ACETONIDE(NASAL)) Inhale 2 sprays in each nostril as directed once a day  #1 x 2   Entered and Authorized by:   Myrtie Soman  MD   Signed by:   Myrtie Soman  MD on 08/23/2009   Method used:   Faxed to ...       [facility] (retail)       [address] [place], [place]  [address]       Ph: [phone]       Fax: [phone]   RxID:   [id number]   Laboratory Results   Blood Tests   Date/Time Received: August 23, 2009 10:00 AM  Date/Time Reported: August 23, 2009 11:20 AM   SED rate: 33 mm/hr  Comments: ...........test performed by...........Marland KitchenTerese Door, CMA

## 2010-07-31 NOTE — Miscellaneous (Signed)
  Clinical Lists Changes  Medications: Changed medication from ALLEGRA-D 12 HOUR 60-120 MG XR12H-TAB (FEXOFENADINE-PSEUDOEPHEDRINE) one by mouth two times a day to CLARINEX-D 24 HOUR 5-240 MG XR24H-TAB (DESLORATADINE-PSEUDOEPHEDRINE) one by mouth daily for allergies - Signed Rx of CLARINEX-D 24 HOUR 5-240 MG XR24H-TAB (DESLORATADINE-PSEUDOEPHEDRINE) one by mouth daily for allergies;  #90 x 1;  Signed;  Entered by: Myrtie Soman  MD;  Authorized by: Myrtie Soman  MD;  Method used: Faxed to [facility], [address] [place], [place], [place]  [address], Ph: [phone], Fax: [phone]    Prescriptions: CLARINEX-D 24 HOUR 5-240 MG XR24H-TAB (DESLORATADINE-PSEUDOEPHEDRINE) one by mouth daily for allergies  #90 x 1   Entered and Authorized by:   Myrtie Soman  MD   Signed by:   Myrtie Soman  MD on 09/19/2009   Method used:   Faxed to ...       [facility] Department (retail)       [address] [place], [place]  [address]       Ph: [phone]       Fax: [phone]   RxID:   [id number]

## 2010-07-31 NOTE — Miscellaneous (Signed)
Summary: Re; Ophthalmology appointment  Clinical Lists Changes  spoke with patient and she currently has no insurance. she did have [employer] which is differnetn from [employer].  that coverage has expired.   gave her Vernell Morgans number to see if she qualifies and she will bring in orange card to [place] if she qualifies and then we will make referral. Theresia Lo RN  February 15, 2010 11:47 AM

## 2010-07-31 NOTE — Progress Notes (Signed)
Summary: pt seen in ED, needs rheum referral  Phone Note Outgoing Call   Call placed by: Lequita Asal  MD,  Nov 18, 2009 9:25 PM Summary of Call: patient seen in ED. complaining of wrist pains similar to prior arthralgias. patient would like referral to [facility] rheumatology. please schedule in clinic Monday with PCP to discuss/coordinate referral. patient given rx for vicodin and voltaren gel from ED.  Initial call taken by: Lequita Asal  MD,  Nov 18, 2009 9:30 PM

## 2010-07-31 NOTE — Assessment & Plan Note (Signed)
Summary: f/u knees,df   Vital Signs:  Patient profile:   60 year old female Height:      63 inches Weight:      172.5 pounds BMI:     30.67 Temp:     97.7 degrees F oral Pulse rate:   85 / minute BP sitting:   148 / 82  (left arm) Cuff size:   regular  Vitals Entered By: Garen Grams LPN (July 05, 2009 10:56 AM) CC: left knee pain Is Patient Diabetic? Yes Pain Assessment Patient in pain? yes     Location: left knee Intensity: 8   Primary Care Provider:  Myrtie Soman  MD  CC:  left knee pain.  History of Present Illness: 1. Knee Persistent L knee pain. Standing films done 11/29. Showed collapse of medial compartment bilaterally with L sided medial defect possibly correlating with meniscal pathology. No obvious swelling. Meloxicam hasn't helped much. Brace has not helped much. Hurts worse with movement, espcially transfers in and out of car. Fell on that knee many months ago but did not have it evaluated. Has never had an injection in that knee  2. cough/sinus/HA family members with similar symptoms. Started on Sunday. Better yesterday, now worse. HA is most significant for the patient.i  3. grief 06/22/09 - 2 yo that she's watched for a year died from choking on a piece of candy. Not family but "like a grandson". Autopsy is pending at this time. This has been exceedingly difficult.  Decreased appetite. Hasn't been taking klonipin as the pharmamcy told her it was denied (I actually did not deny this medication). Doesn't think she needs to talk to anyone. Denies SI/HI.  Habits & Providers  Alcohol-Tobacco-Diet     Tobacco Status: never  Current Medications (verified): 1)  Bayer Childrens Aspirin 81 Mg Chew (Aspirin) .... Take 1 Tablet By Mouth Once A Day 2)  Crestor 10 Mg Tabs (Rosuvastatin Calcium) .... Take 1 Tablet By Mouth At Bedtime 3)  Metformin Hcl 1000 Mg Tabs (Metformin Hcl) .... Take 1 Tablet By Mouth Two Times A Day 4)  Nasacort Aq 55 Mcg/act Aers  (Triamcinolone Acetonide(Nasal)) .... Inhale 2 Sprays in Each Nostril As Directed Once A Day 5)  Celexa 40 Mg  Tabs (Citalopram Hydrobromide) .... Take One Tab By Mouth Daily 6)  Allegra-D 12 Hour 60-120 Mg Xr12h-Tab (Fexofenadine-Pseudoephedrine) .... One By Mouth Two Times A Day 7)  Klonopin 0.5 Mg  Tabs (Clonazepam) .... Take One Tablet Two Times A Day As Needed For Anxiety 8)  Protonix 40 Mg Tbec (Pantoprazole Sodium) .... One By Mouth Daily 9)  Ventolin Hfa 108 (90 Base) Mcg/act Aers (Albuterol Sulfate) .... 9 Gm Mdi For $9 2 Puffs Qid As Needed For Wheeze 10)  Meloxicam 7.5 Mg Tabs (Meloxicam) .... Take One By Mouth Daily With Food For Knee Pain 11)  Decongestant 60 60 Mg Tabs (Pseudoephedrine Hcl) .... One By Mouth Every 4 Hours As Needed 12)  Hydrocodone-Homatropine 5-1.5 Mg/27ml Syrp (Hydrocodone-Homatropine) .... 1/2 To 1 Teaspoon Every 12 Hours As Needed For Cough. Disp 60 Cc  Allergies (verified): 1)  ! 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 On the MAP program. Lives in trailer. Uses wood for heat. Stressful family situation. 59 yo dtr newly pregnant (due march 2011)  Review of Systems  no fever. Some nausea. Some diarrhea  Physical Exam  General:  Well-developed,well-nourished,in no acute distress; alert,appropriate and cooperative throughout examination Nose:  boggy nasal mucosa Mouth:  OP pink and moist; no erythema, drainage, or discharge  Neck:  mild L anterior cervical LAD Lungs:  slight exp wheeze; work of breathing unlabored; active cough Heart:  Normal rate and regular rhythm. S1 and S2 normal without gallop, murmur, click, rub or other extra sounds. Msk:  Knee: Left Normal to inspection with no erythema or effusion or obvious bony abnormalities. Palpation reveals medial > lateral joint line tenderness  ROM normal  in flexion and extension  Ligaments with solid consistent endpoints including ACL, PCL, LCL, MCL. Pain with Valgus stress. McMurray's defererd due to pain. Non painful patellar compression. Patellar and quadriceps tendons unremarkable.    Skin:  Intact without suspicious lesions or rashes Psych:  slightly anxious and tearful when discussing loss of child for whom she babysat.  Additional Exam:  Patient given informed consent for injection. Discussed possible complications of infection, bleeding. Appropriate verbal time out taken. Area cleaned and prepped in usual sterile fashion.1cc kennalog 40 plus4 cc 1% lidocaine without epinephrine was injected into the L knee joint-. Patient tolerated procedure well with no complications.    Impression & Recommendations:  Problem # 1:  KNEE PAIN, CHRONIC (ICD-719.46) Assessment Unchanged  injection today. If no improvement consider referral to ortho.  Her updated medication list for this problem includes:    Bayer Childrens Aspirin 81 Mg Chew (Aspirin) .Marland Kitchen... Take 1 tablet by mouth once a day    Meloxicam 7.5 Mg Tabs (Meloxicam) .Marland Kitchen... Take one by mouth daily with food for knee pain  Orders: [facility]- Est  Level 4 (54098)  Problem # 2:  COUGH (ICD-786.2) Assessment: New cough syrup and decongestant. supportive care otherwise   Problem # 3:  MOURNING (ICD-309.0) Assessment: New  pt seems to be dealing with this appropriately. she agrees to call for worsening anxiety or depression or if she feels talking to someone would be helpful. denies SI/HI.  Orders: [facility]- Est  Level 4 (11914)  Complete Medication List: 1)  Bayer Childrens Aspirin 81 Mg Chew (Aspirin) .... Take 1 tablet by mouth once a day 2)  Crestor 10 Mg Tabs (Rosuvastatin calcium) .... Take 1 tablet by mouth at bedtime 3)  Metformin Hcl 1000 Mg Tabs (Metformin hcl) .... Take 1 tablet by mouth two times a day 4)  Nasacort Aq 55 Mcg/act Aers (Triamcinolone acetonide(nasal)) .... Inhale 2  sprays in each nostril as directed once a day 5)  Celexa 40 Mg Tabs (Citalopram hydrobromide) .... Take one tab by mouth daily 6)  Allegra-d 12 Hour 60-120 Mg Xr12h-tab (Fexofenadine-pseudoephedrine) .... One by mouth two times a day 7)  Klonopin 0.5 Mg Tabs (Clonazepam) .... Take one tablet two times a day as needed for anxiety 8)  Protonix 40 Mg Tbec (Pantoprazole sodium) .... One by mouth daily 9)  Ventolin Hfa 108 (90 Base) Mcg/act Aers (Albuterol sulfate) .... 9 gm mdi for $9 2 puffs qid as needed for wheeze 10)  Meloxicam 7.5 Mg Tabs (Meloxicam) .... Take one by mouth daily with food for knee pain 11)  Decongestant 60 60 Mg Tabs (Pseudoephedrine hcl) .... One by mouth every 4 hours as needed 12)  Hydrocodone-homatropine 5-1.5 Mg/50ml Syrp (Hydrocodone-homatropine) .... 1/2 to 1 teaspoon every 12 hours as needed for cough. disp 60 cc  Other Orders: [facility] (78295)  Patient Instructions: 1)  don't take advil and meloxicam  on the same day. 2)  Take the pseudoephedrine for your congestion and headache. 3)  Use the cough medicine as needed. 4)  Ice your knee tonight and keep it elevated.  If you're not noticing any improvement we may see about having an orthopedist look at you. 5)  Let me know if you feel like you need someone to talk to about your loss. 6)  follow-up with me in 2 months or earlier if needed. Prescriptions: HYDROCODONE-HOMATROPINE 5-1.5 MG/5ML SYRP (HYDROCODONE-HOMATROPINE) 1/2 to 1 teaspoon every 12 hours as needed for cough. disp 60 cc  #1 x 0   Entered and Authorized by:   Myrtie Soman  MD   Signed by:   Myrtie Soman  MD on 07/05/2009   Method used:   Print then Give to Patient   RxID:   [id number] DECONGESTANT 60 60 MG TABS (PSEUDOEPHEDRINE HCL) one by mouth every 4 hours as needed  #30 x 0   Entered and Authorized by:   Myrtie Soman  MD   Signed by:   Myrtie Soman  MD on 07/05/2009   Method used:   Print then Give to Patient   RxID:    [id number]   Laboratory Results   Blood Tests   Date/Time Received: July 05, 2009 10:55 AM  Date/Time Reported: July 05, 2009 11:08 AM   HGBA1C: 7.0%   (Normal Range: Non-Diabetic - 3-6%   Control Diabetic - 6-8%)  Comments: ...........test performed by...........Marland KitchenTerese Door, CMA

## 2010-07-31 NOTE — Progress Notes (Signed)
Summary: knee pain   Phone Note Call from Patient Call back at Home Phone [phone]   Caller: Patient Summary of Call: Having knee pain.   Initial call taken by: Clydell Hakim,  December 13, 2009 10:07 AM  Follow-up for Phone Call        hx chronic knee pain. states she is on meds for this .using a crutch. stays at home. not out of meds.  advised rest, take meds. wakes with pain. advised taking her last pill of the day at bedtime to help her sleep thru the night. she is worried about runnning out. states she needs a L knee replacement but has no insurance. cannot stand that r knee is going bad. she will see Dr. Sandi Mealy at 11am tomorrow. asked her to bring all meds with her Follow-up by: [name] RN,  December 13, 2009 10:38 AM  Additional Follow-up for Phone Call Additional follow up Details #1::        Patient continues to have knee pain, worse that this morning. Advised to follow up as scheduled tomorrow. Advised to continue to take percocet as directed. Advised to rest, ice, compress. Advised to come to emergency room if pain becomes unbearable.  Additional Follow-up by: Bobby Rumpf  MD,  December 13, 2009 8:29 PM

## 2010-07-31 NOTE — Assessment & Plan Note (Signed)
Summary: wants referral/[place]   Vital Signs:  Patient profile:   60 year old female Height:      63 inches Weight:      170 pounds BMI:     30.22 Temp:     98.2 degrees F oral Pulse rate:   85 / minute BP sitting:   129 / 85  (left arm) Cuff size:   regular  Vitals Entered By: Garen Grams LPN (Nov 20, 2009 9:58 AM) CC: wants referral to rheum Is Patient Diabetic? No Pain Assessment Patient in pain? yes     Location: wrists   Primary Care Provider:  Myrtie Soman  MD  CC:  wants referral to rheum.  History of Present Illness: 1. bilateral wrist pain Pt continues to have arthralgias, today pain is in both wrists.  Pain was so severe that she went to the ED 5/20. ESR was elevated at 51 ( 33 in Feb 2011). CBC with mildly elevated WBC to 11 with 70% N. BMP normal. Pt reports that she had pain in her hip last week. States that L wrist/hand is the worst now but R wrist is also painful. Had streaking up the R wrist Saturday but this has improved.   Since Feb she has been with complaints of  pain in both shoulders R knee, both hands/wrists, jaw.  In Feb ANA, RF, ESR (31), uric acid all normal. CRP done April was normal at 2.8.   Sister has RA.   Pt has PMHx sig for diabetes (last a1c 7.7 with recent steroids), L Rotator cuff tear, asthma, anxiety.  Allergies: 1)  ! Sulfa  Past History:  Past Medical History: HLD DM Anxiety Asthma Allergic Rhinitis 2D ECHO 10/99 = EF 55% Negative r/o MI w/u 26-Nov-2022 L rotator cuff tear (old) - diagnosed by MRI 2/11 - steroid injection 08/30/09 negative r/u MI w/u 2/11   Family History: Reviewed history from 08/23/2009 and no changes required. 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 On the [employer] program. Lives in trailer. Uses wood for heat. Stressful family situation. 61 yo dtr just had a baby 08/30/09. Lives with Morrie Sheldon (oldest grandchild) and her husband. Morrie Sheldon has intellectual disability  Physical Exam  General:  vital signs reviewed and normal Alert, appropriate; well-dressed and well-nourished  Appears uncomfortable Msk:  R wrist tender over TFCC. No frank swelling or warmth. ? mild streaking up forearm along ulna. Generally preserved ROM. R hand normal and nontender.  L wrist normal L hand with tenderness to palpation at 2nd and 3rd MCP joints, worse at 3rd. + swelling, erythema, bogginess and warmth over these joints. ROM limited by pain. Other joints in the L hand are uninvolved.   No pain bilateral hips, knees, shoulders.    Impression & Recommendations:  Problem # 1:  POLYARTHRITIS (ICD-719.49) Assessment Deteriorated Now with inflammatory synovitis of L hand and R wrist. Will refer to rheum at [facility]. This may take some time so for now will treat with 10 day steroid taper, change from ibuprofen to diclofenac and add percocet  as needed. Rheum labs normal in Feb but now ESR is elevated to 51. Sister with RA. Pt has follow-up 6/9 with me. Asked her to call earlier if she has any problems at all.   Orders: Rheumatology Referral (Rheumatology) Ketorolac-Toradol 15mg  [phone]) [facility]- Est  Level 4 (63016)  Complete Medication List: 1)  Bayer Childrens Aspirin 81 Mg Chew (Aspirin) .... Take 1 tablet by mouth once a day 2)  Crestor 10 Mg Tabs (Rosuvastatin calcium) .... Take 1 tablet by mouth at bedtime 3)  Metformin Hcl 1000 Mg Tabs (Metformin hcl) .... Take 1 tablet by mouth two times a day 4)  Nasacort Aq 55 Mcg/act Aers (Triamcinolone acetonide(nasal)) .... Inhale 2 sprays in each nostril as directed once a day 5)  Celexa 40 Mg Tabs  (Citalopram hydrobromide) .... Take one tab by mouth daily 6)  Clarinex-d 24 Hour 5-240 Mg Xr24h-tab (Desloratadine-pseudoephedrine) .... One by mouth daily for allergies 7)  Klonopin 0.5 Mg Tabs (Clonazepam) .... Take one tablet two times a day as needed for anxiety 8)  Protonix 40 Mg Tbec (Pantoprazole sodium) .... One by mouth daily 9)  Ventolin Hfa 108 (90 Base) Mcg/act Aers (Albuterol sulfate) .... 9 gm mdi for $9 2 puffs qid as needed for wheeze 10)  Prednisone 20 Mg Tabs (Prednisone) .... 2 tabs by mouth daily x 5 days, 1 tab by mouth daily x 5 days 11)  Fluconazole 150 Mg Tabs (Fluconazole) .... One tab by mouth x 1, repeat in 72 hours 12)  Percocet 5-325 Mg Tabs (Oxycodone-acetaminophen) .Marland Kitchen.. 1-2 tabs every 4-6 hours as needed 13)  Diclofenac Sodium 75 Mg Tbec (Diclofenac sodium) .... One by mouth two times a day -- take with food  Patient Instructions: 1)  We will work on referring you to rheumatology at [facility] 2)  It may take a while. In the meantime, let's do steroids for another 10 days. 3)  we'll give you a shot today and I will switch your pain medicine to diclofenac 4)  Take the percocet as needed for pain -- do not take this with tylenol or vicodin.  Prescriptions: DICLOFENAC SODIUM 75 MG TBEC (DICLOFENAC SODIUM) one by mouth two times a day -- take with food  #60 x 0   Entered and Authorized by:   Myrtie Soman  MD   Signed by:   Myrtie Soman  MD on 11/20/2009   Method used:   Print then Give to Patient   RxID:   [id number] PERCOCET 5-325 MG TABS (OXYCODONE-ACETAMINOPHEN) 1-2 tabs every 4-6 hours as needed  #60 x 0   Entered and Authorized by:   Myrtie Soman  MD   Signed by:   Myrtie Soman  MD on 11/20/2009   Method used:   Print then Give to Patient   RxID:   [id number] PREDNISONE 20 MG TABS (PREDNISONE) 2 tabs by mouth daily x 5 days, 1 tab by mouth daily x 5 days  #15 x 0   Entered and Authorized by:   Myrtie Soman  MD   Signed by:   Myrtie Soman  MD on 11/20/2009   Method used:   Print then Give to Patient   RxID:   [id number]    Medication Administration  Injection # 1:    Medication: Ketorolac-Toradol 15mg     Diagnosis: WRIST PAIN (IOE-703.50)    Route: IM    Site: LUOQ gluteus    Exp Date: 07/02/2011    Lot #: [id number]    Mfr:  [email or web address]    Patient tolerated injection without complications    Given by: Jone Baseman CMA (Nov 20, 2009 10:25 AM)  Orders Added: 1)  Rheumatology Referral [Rheumatology] 2)  Ketorolac-Toradol 15mg  [J1885] 3)  [facility]- Est  Level 4 [21308]

## 2010-07-31 NOTE — Assessment & Plan Note (Signed)
Summary: FU/KH   Vital Signs:  Patient profile:   60 year old female Height:      63 inches Weight:      168.5 pounds BMI:     29.96 Temp:     97.9 degrees F oral Pulse rate:   79 / minute BP sitting:   133 / 85  (left arm) Cuff size:   regular  Vitals Entered By: Garen Grams LPN (Nov 07, 2009 8:36 AM) CC: f/u Is Patient Diabetic? Yes Did you bring your meter with you today? No Pain Assessment Patient in pain? yes     Location: headache   Primary Care Provider:  Myrtie Soman  MD  CC:  f/u.  History of Present Illness: 1. arthralgias Seen by Dr. Darrick Penna 4/28 with wrist pain. CRP done at that time and average (2.8). Is finishing up prednisone course prescribed by Dr. Darrick Penna. Still having joint aches but they seem a litte further apart. Last episode was L ankle pain that started on Friday and subsided on Sunday.  ROS: thinks she has yeast infection -- reports vaginal itching and burning. Thinks this is from the prednisone.  No fevers.   2. Diabetestaking medications: yes -- metformin problems with medications?: no  blood sugar testing frequency: doesn't test hypoglycemic events?: no subjective: finishing a course of prednisone. Pt reports that she has not been watching her diet of late.   ROS chest pain: no   shortness of breath: no   polyuria:  no   polydipsia: no    problems with feet: none except dry skin   3. Headache Thinks its due to allergies and sinus issues. Pharmacy ([facility]) has medicine ordered but it's not in yet.   location/quality: forehad and sinus areas duration: since the spring season started aggravating factors: pt cannot say alleviating factors: excedrin, ibuprofen, tylenol help a little throbbing/pulsating?: yes nausea/vomiting?: no photophobia/phonophobia?: no visual scotoma/aura: no  on migraine prophylaxis?: no  ROS: rescendo/thunderclap: no    weakness/numbness:  no  neck pain/stiffness: no  Habits &  Providers  Alcohol-Tobacco-Diet     Tobacco Status: never  Current Medications (verified): 1)  Bayer Childrens Aspirin 81 Mg Chew (Aspirin) .... Take 1 Tablet By Mouth Once A Day 2)  Crestor 10 Mg Tabs (Rosuvastatin Calcium) .... Take 1 Tablet By Mouth At Bedtime 3)  Metformin Hcl 1000 Mg Tabs (Metformin Hcl) .... Take 1 Tablet By Mouth Two Times A Day 4)  Nasacort Aq 55 Mcg/act Aers (Triamcinolone Acetonide(Nasal)) .... Inhale 2 Sprays in Each Nostril As Directed Once A Day 5)  Celexa 40 Mg  Tabs (Citalopram Hydrobromide) .... Take One Tab By Mouth Daily 6)  Clarinex-D 24 Hour 5-240 Mg Xr24h-Tab (Desloratadine-Pseudoephedrine) .... One By Mouth Daily For Allergies 7)  Klonopin 0.5 Mg  Tabs (Clonazepam) .... Take One Tablet Two Times A Day As Needed For Anxiety 8)  Protonix 40 Mg Tbec (Pantoprazole Sodium) .... One By Mouth Daily 9)  Ventolin Hfa 108 (90 Base) Mcg/act Aers (Albuterol Sulfate) .... 9 Gm Mdi For $9 2 Puffs Qid As Needed For Wheeze 10)  Prednisone 20 Mg Tabs (Prednisone) .... 2 Tabs By Mouth Daily X 5 Days, 1 Tab By Mouth Daily X 5 Days 11)  Fluconazole 150 Mg Tabs (Fluconazole) .... One Tab By Mouth X 1, Repeat in 72 Hours  Allergies (verified): 1)  ! Sulfa  Review of Systems       review of systems as noted in HPI section   Physical Exam  General:  Vital signs reviewed: overweight but otherwise normal Alert, appropriate. No acute distress  Head:  + tenderness to palpation over frontal and maxillary sinus . Eyes:  conjunctivae pink, sclerae clear  Mouth:  oropharynx pink, moist; no erythema or exudate  Neck:  no carotid bruits, no JVD, no tenderness or masses  Lungs:  work of breathing unlabored, clear to auscultation bilaterally; no wheezes, rales, or ronchi; good air movement throughout  Heart:  regular rate and rhythm, no murmurs; normal s1/s2  Neurologic:  alert and oriented. speech normal. station and gait normal. no gross deficitis.    Impression &  Recommendations:  Problem # 1:  POLYARTHRITIS (ICD-719.49)  seen by Dr. Darrick Penna 4/28. CRP 2.8 at that time. Rheum labs negative in Feb. Pt denies any pain today. Continue and complete course of prednisone. Pt states that she can't afford the gas for a referral to [facility], [facility], etc. Will continue to observe. I stated that it was okay to watch it for now but that she would need to evaluate the interference of this problem in her life and consider whether referral would be worth it. Advised that this may be the best thing for her. Pt expresses agreement and understanding   Orders: [facility]- Est  Level 4 ([address])  Problem # 2:  DIABETES MELLITUS II, UNCOMPLICATED (ICD-250.00)  A1c is deteriorated today. Discussed improving diet.. pt is agreeable. Recent course of prednisone may have some effect too. Rechk in 3 months. Pt does not check sugars regularly.  Labs Reviewed: Creat: 0.56 (08/15/2009)   Microalbumin: NEGATVIE (07/13/2007) Reviewed HgBA1c results: 7.7 (11/07/2009)  7.0 (07/05/2009)  Orders: A1C-[facility] ([address]) [facility]- Est  Level 4 (09811)  Problem # 3:  SINUSITIS, CHRONIC (ICD-473.9) Assessment: Deteriorated  likely the cause of her HA, likely worse with increase in environment allergens. Will tx with pseudoephedrine until clarinex comes in . Pt is agreeable. consider migraine HA but history and exam most consistent with sinus pathology. Access to meds is an issue.  Her updated medication list for this problem includes:    Nasacort Aq 55 Mcg/act Aers (Triamcinolone acetonide(nasal)) ..... Inhale 2 sprays in each nostril as directed once a day    Clarinex-d 24 Hour 5-240 Mg Xr24h-tab (Desloratadine-pseudoephedrine) ..... One by mouth daily for allergies  Orders: [facility]- Est  Level 4 ([address])  Complete Medication List: 1)  Bayer Childrens Aspirin 81 Mg Chew (Aspirin) .... Take 1 tablet by mouth once a day 2)  Crestor 10 Mg Tabs (Rosuvastatin calcium) .... Take 1 tablet by mouth at bedtime 3)   Metformin Hcl 1000 Mg Tabs (Metformin hcl) .... Take 1 tablet by mouth two times a day 4)  Nasacort Aq 55 Mcg/act Aers (Triamcinolone acetonide(nasal)) .... Inhale 2 sprays in each nostril as directed once a day 5)  Celexa 40 Mg Tabs (Citalopram hydrobromide) .... Take one tab by mouth daily 6)  Clarinex-d 24 Hour 5-240 Mg Xr24h-tab (Desloratadine-pseudoephedrine) .... One by mouth daily for allergies 7)  Klonopin 0.5 Mg Tabs (Clonazepam) .... Take one tablet two times a day as needed for anxiety 8)  Protonix 40 Mg Tbec (Pantoprazole sodium) .... One by mouth daily 9)  Ventolin Hfa 108 (90 Base) Mcg/act Aers (Albuterol sulfate) .... 9 gm mdi for $9 2 puffs qid as needed for wheeze 10)  Prednisone 20 Mg Tabs (Prednisone) .... 2 tabs by mouth daily x 5 days, 1 tab by mouth daily x 5 days 11)  Fluconazole 150 Mg Tabs (Fluconazole) .... One tab by mouth  x 1, repeat in 72 hours  Patient Instructions: 1)  take pseudoephedrine (ask the pharmacist) for your HA until your clarinex comes in 2)  take the diflucan once and then repeat in 72 hours 3)  follow-up with me in one month or earlier if needed 4)  nice to see you today. Prescriptions: FLUCONAZOLE 150 MG TABS (FLUCONAZOLE) one tab by mouth x 1, repeat in 72 hours  #2 x 0   Entered and Authorized by:   Myrtie Soman  MD   Signed by:   Myrtie Soman  MD on 11/07/2009   Method used:   Faxed to ...       [facility] Department (retail)       [address] [place], [place]  [address]       Ph: [phone]       Fax: [phone]   RxID:   [id number]    Prevention & Chronic Care Immunizations   Influenza vaccine: Fluvax Non-MCR  (05/02/2009)   Influenza vaccine due: 06/11/2008    Tetanus booster: 01/03/2005: Done.   Tetanus booster due: 01/04/2015    Pneumococcal vaccine: Not documented  Colorectal Screening   Hemoccult: Not documented   Hemoccult due: Not Indicated    Colonoscopy: normal  (05/07/2006)    Colonoscopy due: 05/08/2011  Other Screening   Pap smear: Not documented   Pap smear due: Not Indicated    Mammogram: ASSESSMENT: Negative - BI-RADS 1^MS DIGITAL SCREENING  (05/29/2009)   Mammogram due: 05/29/2010   Smoking status: never  (11/07/2009)  Diabetes Mellitus   HgbA1C: 7.7  (11/07/2009)   Hemoglobin A1C due: 09/10/2007    Eye exam: Not documented    Foot exam: yes  (09/15/2009)   High risk foot: Not documented   Foot care education: Not documented   Foot exam due: 07/12/2008    Urine microalbumin/creatinine ratio: Not documented   Urine microalbumin/cr due: 07/12/2008    Diabetes flowsheet reviewed?: Yes   Progress toward A1C goal: Deteriorated  Lipids   Total Cholesterol: 119  (08/22/2008)   LDL: 58  (08/15/2009)   LDL Direct: 65  (06/12/2007)   HDL: 48  (08/22/2008)   Triglycerides: 126  (08/22/2008)    SGOT (AST): 11  (08/22/2008)   SGPT (ALT): 14  (08/22/2008)   Alkaline phosphatase: 62  (08/22/2008)   Total bilirubin: 0.4  (08/22/2008)    Lipid flowsheet reviewed?: Yes   Progress toward LDL goal: At goal  Self-Management Support :   Personal Goals (by the next clinic visit) :     Personal A1C goal: 7  (03/09/2009)     Personal blood pressure goal: 130/80  (03/09/2009)     Personal LDL goal: 70  (03/09/2009)    Diabetes self-management support: Written self-care plan, Referred for DM self-management training  (09/15/2009)    Diabetes self-management support not done because: Not indicated  (08/30/2009)    Lipid self-management support: Written self-care plan  (09/15/2009)     Lipid self-management support not done because: Good outcomes  (11/07/2009)  Laboratory Results   Blood Tests   Date/Time Received: Nov 07, 2009 8:41 AM Date/Time Reported: Nov 07, 2009 9:09 AM   HGBA1C: 7.7%   (Normal Range: Non-Diabetic - 3-6%   Control Diabetic - 6-8%)  Comments: ...........test performed by...........Marland KitchenTerese Door, CMA

## 2010-07-31 NOTE — Assessment & Plan Note (Signed)
Summary: RA with knee pain   Vital Signs:  Patient profile:   60 year old female Height:      63 inches Weight:      170 pounds BMI:     30.22 BSA:     1.81 Temp:     98.1 degrees F Pulse rate:   87 / minute BP sitting:   130 / 87  Vitals Entered By: Jone Baseman CMA (January 08, 2010 8:48 AM) CC: Meet new MD Is Patient Diabetic? Yes Did you bring your meter with you today? No Pain Assessment Patient in pain? yes     Location: rigth knee Intensity: 6   Primary Care Provider:  Antoine Primas DO  CC:  Meet new MD.  History of Present Illness: knee pain: flared up about 2 days ago again.  recently dx with RA CCP +.  Pt is able to walk but she does not want to get as bad as she did last time.  Pt is going to see rheum but unable to be seen until end of August.  Pt was put on MTX and folic acid whcih did help in addition to prednisone.  Pt ran out of all meds approximately a week ago. Pt states knee is not as red as swollen as last flare.  Pt is out of percocet which usually helps the pain.  Pt though said overall she is doing somewhat better but is nervous of getting worse again.   DM-  been checking blood sugars been relatively well since being off prednisone, would like to avoid prednisone again unless that is the only thing that would help her pain.  Pt last A1c was 7.7 in 5/11  Asthma-  Pt states she has been using her inhaler daily for the last 3 weeks, feels like she is breathing fine but needs her inhaler.  Pt has had some + sick contacts but deies fever, chills, nausea, vomiting, diarrhea or constipation leg pain or swelling outside of her knee. Does not remember if she has ever had a control medicine.    Habits & Providers  Alcohol-Tobacco-Diet     Tobacco Status: quit     Year Quit: 16 years  Current Medications (verified): 1)  Bayer Childrens Aspirin 81 Mg Chew (Aspirin) .... Take 1 Tablet By Mouth Once A Day 2)  Crestor 10 Mg Tabs (Rosuvastatin Calcium) .... Take  1 Tablet By Mouth At Bedtime 3)  Metformin Hcl 1000 Mg Tabs (Metformin Hcl) .... Take 1 Tablet By Mouth Two Times A Day 4)  Nasacort Aq 55 Mcg/act Aers (Triamcinolone Acetonide(Nasal)) .... Inhale 2 Sprays in Each Nostril As Directed Once A Day 5)  Celexa 40 Mg  Tabs (Citalopram Hydrobromide) .... Take One Tab By Mouth Daily 6)  Clarinex-D 24 Hour 5-240 Mg Xr24h-Tab (Desloratadine-Pseudoephedrine) .... One By Mouth Daily For Allergies 7)  Protonix 40 Mg Tbec (Pantoprazole Sodium) .... One By Mouth Daily 8)  Ventolin Hfa 108 (90 Base) Mcg/act Aers (Albuterol Sulfate) .... 9 Gm Mdi For $9 2 Puffs Qid As Needed For Wheeze 9)  Percocet 10-325 Mg Tabs (Oxycodone-Acetaminophen) .Marland Kitchen.. 1 By Mouth Q6hr As Needed Severe Pain. 10)  Nitrostat 0.4 Mg Subl (Nitroglycerin) .... One By Mouth As Needed For Chest Pain; May Repeat 2 More Times Every 5 Minutes; If Chest Pain Does Not Resolve After Three Doses, Call 911 11)  Methotrexate 2.5 Mg Tabs (Methotrexate Sodium) .... 3 Tabs By Mouth Every Friday.  Disp 1 Mo Supply 12)  Folic Acid  1 Mg Tabs (Folic Acid) .Marland Kitchen.. 1 By Mouth Once Daily Every Day Except Friday.  Allergies (verified): 1)  ! Sulfa  Past History:  Past medical, surgical, family and social histories (including risk factors) reviewed, and no changes noted (except as noted below).  Past Medical History: RA- CCP positive, will be followed at [facility] HLD DM Anxiety Asthma Allergic Rhinitis 2D ECHO 10/99 = EF 55% Negative r/o MI w/u 11-12-22 L rotator cuff tear (old) - diagnosed by MRI 2/11 - steroid injection 08/30/09 negative r/u MI w/u 2/11  Past Surgical History: Reviewed history from 08/23/2009 and no changes required. Appendectomy - 07/05/2004 Bone spur removal left foot - 07/05/2004 Cholecystectomy - 07/05/2004 Colonoscopy-polypectomy - 07/01/1994 Hysterectomy-complete - 07/05/2004 Right carpal tunnel release - 07/05/2004 Right knee surg x2 - 07/05/2004 UGI Series w/ SB followthru-mild GERD -  01/30/1999  Family History: Reviewed history from 12/14/2009 and no changes required. Dad died at 23 2/2 CVA, MI; he also had DM,  Mom died at 42 2/2 complications associated w/ PNA, One brother healthy (69),  One brother with inoperable lung CA (49)-died 11/12/22, One sister with DM (57), One sister with RA (56) treated with plaquenil, prednisone, MTX Three kids all healthy  Social History: Reviewed history from 11/20/2009 and no changes required. 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 On the [employer] program. Lives in trailer. Uses wood for heat. Stressful family situation. 61 yo dtr just had a baby 08/30/09. Lives with Morrie Sheldon (oldest grandchild) and her husband. Morrie Sheldon has intellectual disability  Review of Systems       denies fever, chills, nausea, vomiting, diarrhea or constipation   Physical Exam  General:  vitals signs reviewed -- WNL except for mild tachycardia.  appears uncomfortable.  well devoloped.  well hydrated Mouth:  oropharynx pink, moist; no erythema or exudate  Lungs:  work of breathing unlabored, clear to auscultation bilaterally; no wheezes, rales, or ronchi; good air movement throughout  Heart:  regular rate and rhythm, no murmurs; normal s1/s2  Abdomen:  soft, non-tender, normal bowel sounds, and no distention.   Msk:  right knee no warmness, mildly swollen but without large effusion.  no erythema.  diminished ROM due to pain.  able to bear weight.  exquisitely tender to palpation along joint lines  Pulses:  DP and radial pulses 2+ bilaterally  Extremities:  trace le edema b/l Neurologic:  CN 2-12 intact   Impression & Recommendations:  Problem # 1:  RHEUMATOID ARTHRITIS (ICD-714.0) Pt recently had labs confirm the dx.  Pt did respond well to the meds.  Will avoid prednisone due to no looking like an acute flare, will continue the DMARD of MTX with  folic acid supplementation until pt can be seen by rheum.  Will have labs done in 3 weeks to make sure no liver or myelosupression is occuring.  Will f/u in 1 month.  If worsen will give another round of prednisone.  The following medications were removed from the medication list:    Prednisone 10 Mg Tabs (Prednisone) .Marland KitchenMarland KitchenMarland KitchenMarland Kitchen 6po once daily x10d then 5 x2d then 4x2d then 3x2d then 2x2d then 1x2d then stop. disp qs Her updated medication list for this problem includes:    Bayer Childrens Aspirin 81 Mg Chew (Aspirin) .Marland Kitchen... Take 1 tablet by mouth once a day    Methotrexate 2.5 Mg Tabs (Methotrexate  sodium) .Marland KitchenMarland KitchenMarland KitchenMarland Kitchen 3 tabs by mouth every friday.  disp 1 mo supply  Orders: [facility]- Est  Level 4 ([address])Future Orders: Comp Met-[facility] ([id number]) ... 01/23/2011 [facility] (40973) ... 01/10/2011  Problem # 2:  ASTHMA, MILD, INTERMITTENT (ICD-493.90) Pt did not want a new med at moment, will see pt again in 1 month but sound more like mod persistent asthma now and may need inhaled steroid to help. Sister had COPD and pt does not want to be like sister.  The following medications were removed from the medication list:    Prednisone 10 Mg Tabs (Prednisone) .Marland KitchenMarland KitchenMarland KitchenMarland Kitchen 6po once daily x10d then 5 x2d then 4x2d then 3x2d then 2x2d then 1x2d then stop. disp qs Her updated medication list for this problem includes:    Ventolin Hfa 108 (90 Base) Mcg/act Aers (Albuterol sulfate) .Marland Kitchen... 9 gm mdi for $9 2 puffs qid as needed for wheeze  Orders: [facility]- Est  Level 4 ([address])  Problem # 3:  DIABETES MELLITUS II, UNCOMPLICATED (ICD-250.00) Pt will need A1C at next visit, will try to avoid prednisone, may need low dose glyburide if still elevated.  Her updated medication list for this problem includes:    Bayer Childrens Aspirin 81 Mg Chew (Aspirin) .Marland Kitchen... Take 1 tablet by mouth once a day    Metformin Hcl 1000 Mg Tabs (Metformin hcl) .Marland Kitchen... Take 1 tablet by mouth two times a day  Orders: [facility]- Est  Level 4 ([address])Future Orders: Comp  [facility] ([id number]) ... 01/23/2011  Labs Reviewed: Creat: 0.56 (01/03/2010)   Microalbumin: NEGATVIE (07/13/2007) Reviewed HgBA1c results: 7.7 (11/07/2009)  7.0 (07/05/2009)  Complete Medication List: 1)  Bayer Childrens Aspirin 81 Mg Chew (Aspirin) .... Take 1 tablet by mouth once a day 2)  Crestor 10 Mg Tabs (Rosuvastatin calcium) .... Take 1 tablet by mouth at bedtime 3)  Metformin Hcl 1000 Mg Tabs (Metformin hcl) .... Take 1 tablet by mouth two times a day 4)  Nasacort Aq 55 Mcg/act Aers (Triamcinolone acetonide(nasal)) .... Inhale 2 sprays in each nostril as directed once a day 5)  Celexa 40 Mg Tabs (Citalopram hydrobromide) .... Take one tab by mouth daily 6)  Clarinex-d 24 Hour 5-240 Mg Xr24h-tab (Desloratadine-pseudoephedrine) .... One by mouth daily for allergies 7)  Protonix 40 Mg Tbec (Pantoprazole sodium) .... One by mouth daily 8)  Ventolin Hfa 108 (90 Base) Mcg/act Aers (Albuterol sulfate) .... 9 gm mdi for $9 2 puffs qid as needed for wheeze 9)  Percocet 10-325 Mg Tabs (Oxycodone-acetaminophen) .Marland Kitchen.. 1 by mouth q6hr as needed severe pain. 10)  Nitrostat 0.4 Mg Subl (Nitroglycerin) .... One by mouth as needed for chest pain; may repeat 2 more times every 5 minutes; if chest pain does not resolve after three doses, call 911 11)  Methotrexate 2.5 Mg Tabs (Methotrexate sodium) .... 3 tabs by mouth every friday.  disp 1 mo supply 12)  Folic Acid 1 Mg Tabs (Folic acid) .Marland Kitchen.. 1 by mouth once daily every day except friday.  Patient Instructions: 1)  Nice to meet you 2)  I  want you to continue the methatrexate and folic acid 3)  I sent in the prescriptions 4)  If your cough gets worse or you are using your inhaler more please come back to see me. 5)  I want to get get labs in 3 weeks before you see me 6)  I want you to make an appointment with me in  1 month 7)  Kepp doing the exercises for your knee.  Prescriptions:  PROTONIX 40 MG TBEC (PANTOPRAZOLE SODIUM) one by mouth daily   #90 x 3   Entered and Authorized by:   Antoine Primas DO   Signed by:   Antoine Primas DO on 01/08/2010   Method used:   Faxed to ...       [facility] Department (retail)       [address] [place], [place]  [address]       Ph: [phone]       Fax: [phone]   RxID:   [id number] VENTOLIN HFA 108 (90 BASE) MCG/ACT AERS (ALBUTEROL SULFATE) 9 GM MDI for $9 2 puffs qid as needed for wheeze Brand medically necessary #2 x 3   Entered and Authorized by:   Antoine Primas DO   Signed by:   Antoine Primas DO on 01/08/2010   Method used:   Faxed to ...       [facility] Department (retail)       [address] [place], [place]  [address]       Ph: [phone]       Fax: [phone]   RxID:   [id number] CELEXA 40 MG  TABS (CITALOPRAM HYDROBROMIDE) take one tab by mouth daily  #30 x 3   Entered and Authorized by:   Antoine Primas DO   Signed by:   Antoine Primas DO on 01/08/2010   Method used:   Faxed to ...       [facility] Department (retail)       [address] [place], [place]  [address]       Ph: [phone]       Fax: [phone]   RxID:   [id number] METFORMIN HCL 1000 MG TABS (METFORMIN HCL) Take 1 tablet by mouth two times a day  #180 x 2   Entered and Authorized by:   Antoine Primas DO   Signed by:   Antoine Primas DO on 01/08/2010   Method used:   Electronically to        [facility] (retail)       [address]       [place], [place]  [address]       Ph: [phone]       Fax: [phone]   RxID:   [id number]   Prevention & Chronic Care Immunizations   Influenza vaccine: Fluvax Non-MCR  (05/02/2009)   Influenza vaccine due: 06/11/2008    Tetanus booster: 01/03/2005: Done.   Tetanus booster due: 01/04/2015    Pneumococcal vaccine: Not documented  Colorectal Screening   Hemoccult: Not documented   Hemoccult due: Not Indicated    Colonoscopy: normal   (05/07/2006)   Colonoscopy due: 05/08/2011  Other Screening   Pap smear: Not documented   Pap smear due: Not Indicated    Mammogram: ASSESSMENT: Negative - BI-RADS 1^MS DIGITAL SCREENING  (05/29/2009)   Mammogram due: 05/29/2010   Smoking status: quit  (01/08/2010)  Diabetes Mellitus   HgbA1C: 7.7  (11/07/2009)   Hemoglobin A1C due: 09/10/2007    Eye exam: Not documented    Foot exam: yes  (09/15/2009)   High risk foot: Not documented   Foot care education: Not documented   Foot exam due: 07/12/2008    Urine microalbumin/creatinine ratio: Not documented   Urine microalbumin/cr due: 07/12/2008    Diabetes  flowsheet reviewed?: Yes   Progress toward A1C goal: Unchanged  Lipids   Total Cholesterol: 119  (08/22/2008)   LDL: 58  (08/15/2009)   LDL Direct: 65  (06/12/2007)   HDL: 48  (08/22/2008)   Triglycerides: 126  (08/22/2008)    SGOT (AST): 7  (01/03/2010)   SGPT (ALT): 10  (01/03/2010) CMP ordered    Alkaline phosphatase: 67  (01/03/2010)   Total bilirubin: 0.3  (01/03/2010)    Lipid flowsheet reviewed?: Yes   Progress toward LDL goal: Unchanged  Self-Management Support :   Personal Goals (by the next clinic visit) :     Personal A1C goal: 7  (03/09/2009)     Personal blood pressure goal: 130/80  (03/09/2009)     Personal LDL goal: 70  (03/09/2009)    Diabetes self-management support: Written self-care plan, Referred for DM self-management training  (09/15/2009)    Diabetes self-management support not done because: Not indicated  (08/30/2009)    Lipid self-management support: Written self-care plan  (09/15/2009)     Lipid self-management support not done because: Good outcomes  (11/07/2009)

## 2010-07-31 NOTE — Assessment & Plan Note (Signed)
Summary: F/U/KH   Vital Signs:  Patient profile:   60 year old female Weight:      171.5 pounds Temp:     98.1 degrees F oral Pulse rate:   76 / minute BP sitting:   143 / 91  Vitals Entered By: Loralee Pacas CMA (December 08, 2009 8:43 AM)  Primary Care Provider:  Myrtie Soman  MD   History of Present Illness: 1. polyarthritis  Continues to have migratory arthralgias. Joints hurt for about 3-4 days at a time. Percocet and diclofenac are helping with the pain but it is quite severe and keeps her from doing her ADLs (cooking cleaning) like normal. Most recently has had R great toe pain and L metatarsal pain. This is easing off but now her wrists are starting to hurt. Two courses of steroids have not seemed to help. Rheum labs in Feb were normal. Had CRP done 4/11 and it was unremarkable at 2.8.  We've referred the patient to [facility] rheum and she does not have an appointment until December.  2. chest discomfort Has occasional chest heaviness. Is intermittent and non-exertional. Was admitted for chest pain and ruled out by CEs in Feb. States that she's had this kind of pain for years and, although she's never had a cath, no one has ever found a problem.   Had an excellent lipid profile 2/10 and LDL of 58 in Feb of this year.   nausea:  no   vomiting: no    shortness of breath:    no   Allergies: 1)  ! Sulfa  Review of Systems  The patient denies weight loss and weight gain.         ros otherwise as per HPI.  Physical Exam  General:  vitals signs reviewed -- hypertensive but otherwise normal  Lungs:  work of breathing unlabored, clear to auscultation bilaterally; no wheezes, rales, or ronchi; good air movement throughout  Heart:  regular rate and rhythm, no murmurs; normal s1/s2  Msk:  no joint swelling/warmth/erythema diffusely. Has slight pain with passive ROM of R wrist and R great toe. Normal gait with smooth, easy transfers.  Additional Exam:  EKG: NSR, normal intervals; no  ST changes, T wave inversion; normal R wave progression    Impression & Recommendations:  Problem # 1:  POLYARTHRITIS (ICD-719.49) Assessment Unchanged Will repeat rheum labs. Has rheum appointment at [facility] but not until December. Also repeat CRP, ESR, uric acid level. Hold diclofenac give c/o of chest pain and cardiac risk. Percocet allows functionality and seems to be our best option at this point. Pt is appropriate with her attitude toward this medicine and understands the risks of dependence/tolerance.  Orders: Basic Met-[facility] [phone]) CBC w/Diff-[facility] [phone]) Uric Acid-[facility] ([id number]) ANA-[facility] (51884-16606) Rheum Fact-[facility] (30160) CRP, high sensitivity-[facility] (10932-35573) Sed Rate (ESR)-[facility] (85651) [facility]- Est  Level 4 (22025)  Problem # 2:  CHEST PAIN, ATYPICAL (ICD-786.59) Assessment: New  excellent lipid profile. EKG shows NSR with no ST elevation/depression or T wave abnormality. Given atypical symptoms will not work up further at this point. Return parameters and warning signs of worsening chest pain discussed.  Patient agreeable. See instructions. NTG for now to see if this helps. D/C diclofenac.  Orders: [facility]- Est  Level 4 (42706)  Complete Medication List: 1)  Bayer Childrens Aspirin 81 Mg Chew (Aspirin) .... Take 1 tablet by mouth once a day 2)  Crestor 10 Mg Tabs (Rosuvastatin calcium) .... Take 1 tablet by mouth at bedtime 3)  Metformin Hcl 1000 Mg Tabs (Metformin hcl) .... Take 1 tablet by mouth two times a day 4)  Nasacort Aq 55 Mcg/act Aers (Triamcinolone acetonide(nasal)) .... Inhale 2 sprays in each nostril as directed once a day 5)  Celexa 40 Mg Tabs (Citalopram hydrobromide) .... Take one tab by mouth daily 6)  Clarinex-d 24 Hour 5-240 Mg Xr24h-tab (Desloratadine-pseudoephedrine) .... One by mouth daily for allergies 7)  Protonix 40 Mg Tbec (Pantoprazole sodium) .... One by mouth daily 8)  Ventolin Hfa 108 (90 Base) Mcg/act Aers (Albuterol sulfate) .... 9 gm mdi for  $9 2 puffs qid as needed for wheeze 9)  Percocet 5-325 Mg Tabs (Oxycodone-acetaminophen) .Marland Kitchen.. 1-2 tabs every 4-6 hours as needed 10)  Nitrostat 0.4 Mg Subl (Nitroglycerin) .... One by mouth as needed for chest pain; may repeat 2 more times every 5 minutes; if chest pain does not resolve after three doses, call 911  Other Orders: EKG- [facility] (EKG)  Patient Instructions: 1)  I'll be in touch with you about your blood work. 2)  Take the percocet as needed. 3)  For now stop the diclofenac.  4)  I'm sorry to hear about your husband's cancer. 5)  If your chest discomfort returns and does not resolve after a 5 minutes, you need to be seen or call 911. Take the nitroglycerin if needed. If it doesn't help after 3 doses, you need to be seen or call 911. Prescriptions: NITROSTAT 0.4 MG SUBL (NITROGLYCERIN) one by mouth as needed for chest pain; may repeat 2 more times every 5 minutes; if chest pain does not resolve after three doses, call 911  #30 x 0   Entered and Authorized by:   Myrtie Soman  MD   Signed by:   Myrtie Soman  MD on 12/08/2009   Method used:   Faxed to ...       [facility] (retail)       [address] [place], [place]  [address]       Ph: [phone]       Fax: [phone]   RxID:   [id number] NITROSTAT 0.4 MG SUBL (NITROGLYCERIN) one by mouth as needed for chest pain; may repeat 2 more times every 5 minutes; if chest pain does not resolve after three doses, call 911  #30 x 0   Entered and Authorized by:   Myrtie Soman  MD   Signed by:   Myrtie Soman  MD on 12/08/2009   Method used:   Electronically to        [facility]* (retail)       [address] [place], [place]  [address]       Ph: [phone]       Fax: [phone]   RxID:   [id number]   Appended Document: ESR  37 mm/hr    Lab Visit  Laboratory Results   Blood Tests   Date/Time Received: December 08, 2009 9:45 AM  Date/Time Reported: December 08, 2009 12:10  PM   SED rate: 37  mm/hr  Comments: ...............test performed by......Marland KitchenBonnie A. [place], MLS (ASCP)cm    Orders Today:

## 2010-07-31 NOTE — Assessment & Plan Note (Signed)
Summary: swollen hand,tcb   Vital Signs:  Patient profile:   60 year old female Height:      63 inches Weight:      168.3 pounds BMI:     29.92 Temp:     98.2 degrees F oral Pulse rate:   96 / minute BP sitting:   130 / 79  (left arm) Cuff size:   regular  Vitals Entered By: Garen Grams LPN (March 20, 2010 11:32 AM) CC: pain/swelling in left hand x 2 days Is Patient Diabetic? Yes Did you bring your meter with you today? No Pain Assessment Patient in pain? yes     Location: left hand Intensity: 9   Primary Care Provider:  Antoine Primas DO  CC:  pain/swelling in left hand x 2 days.  History of Present Illness: 1) Rheumatoid Arthritis: Pain flare right hand x 2 days. Pain ad swelling at joints in right hand. On methotrexate, folate and percocet. Percocet helps relieve pain somewhat. Pain is worse with flexion / extension of fingers. Seen by Re[facility]ology at [facility].  Has been under more stress lately and has had more activity as well.   Denies chest pain, dyspnea, rash, fever, chills, nausea, emesis, diarrhea, abdominal pain, vision change.  See prior meds for med rec.      Habits & Providers  Alcohol-Tobacco-Diet     Tobacco Status: never     Year Quit: 16 years  Medications Prior to Update: 1)  Bayer Childrens Aspirin 81 Mg Chew (Aspirin) .... Take 1 Tablet By Mouth Once A Day 2)  Crestor 10 Mg Tabs (Rosuvastatin Calcium) .... Take 1 Tablet By Mouth At Bedtime 3)  Metformin Hcl 1000 Mg Tabs (Metformin Hcl) .... Take 1 Tablet By Mouth Two Times A Day 4)  Nasacort Aq 55 Mcg/act Aers (Triamcinolone Acetonide(Nasal)) .... Inhale 2 Sprays in Each Nostril As Directed Once A Day 5)  Celexa 40 Mg  Tabs (Citalopram Hydrobromide) .... Take One Tab By Mouth Daily 6)  Clarinex-D 24 Hour 5-240 Mg Xr24h-Tab (Desloratadine-Pseudoephedrine) .... One By Mouth Daily For Allergies 7)  Protonix 40 Mg Tbec (Pantoprazole Sodium) .... One By Mouth Daily 8)  Ventolin Hfa 108  (90 Base) Mcg/act Aers (Albuterol Sulfate) .... 9 Gm Mdi For $9 2 Puffs Qid As Needed For Wheeze 9)  Percocet 10-325 Mg Tabs (Oxycodone-Acetaminophen) .Marland Kitchen.. 1 By Mouth Q6hr As Needed Severe Pain. 10)  Nitrostat 0.4 Mg Subl (Nitroglycerin) .... One By Mouth As Needed For Chest Pain; May Repeat 2 More Times Every 5 Minutes; If Chest Pain Does Not Resolve After Three Doses, Call 911 11)  Methotrexate 2.5 Mg Tabs (Methotrexate Sodium) .... 6 Tabs By Mouth Every Friday.  Disp 1 Mo Supply 12)  Folic Acid 1 Mg Tabs (Folic Acid) .Marland Kitchen.. 1 By Mouth Once Daily Every Day Except Friday. 13)  Klonopin 0.5 Mg Tabs (Clonazepam) .Marland Kitchen.. 1 Tab By Mouthtid As Needed For Anxiety 14)  Zyrtec Allergy 10 Mg Caps (Cetirizine Hcl) .Marland Kitchen.. 1 Tab At Bedtime  Allergies (verified): 1)  ! Sulfa  Physical Exam  General:  vitals signs reviewed -- WNL.  appears uncomfortable.  obese.  well hydrated Eyes:  conjunctivae pink, sclerae clear  Mouth:  oropharynx pink, moist; no erythema  Neck:  No deformities, masses, or tenderness noted. Lungs:  work of breathing unlabored, clear to auscultation bilaterally; no wheezes, rales, or ronchi; good air movement throughout  Msk:  right hand exquisitely tender to palpation along joint lines , pain with ROM exercises  at wrist and fingers. mild swelling noted as compared to left at MCP joints .  Neurologic:  alert & oriented X3, cranial nerves II-XII intact, and strength normal in all extremities.     Impression & Recommendations:  Problem # 1:  RHEUMATOID ARTHRITIS (ICD-714.0) Assessment Deteriorated  Will start short course (given DM2) prednisone for pain flare. Follow up with PCP in one week. Continue methotrexate as per dosing schedule below. Appreciate recommendations from [facility].    Her updated medication list for this problem includes:    Bayer Childrens Aspirin 81 Mg Chew (Aspirin) .Marland Kitchen... Take 1 tablet by mouth once a day    Methotrexate 2.5 Mg Tabs (Methotrexate sodium)  .Marland KitchenMarland KitchenMarland KitchenMarland Kitchen 6 tabs by mouth every friday.  disp 1 mo supply    Prednisone 50 Mg Tabs (Prednisone) ..... One tab by mouth x 5 days  Orders: [facility]- Est Level  3 (04540)  Complete Medication List: 1)  Bayer Childrens Aspirin 81 Mg Chew (Aspirin) .... Take 1 tablet by mouth once a day 2)  Crestor 10 Mg Tabs (Rosuvastatin calcium) .... Take 1 tablet by mouth at bedtime 3)  Metformin Hcl 1000 Mg Tabs (Metformin hcl) .... Take 1 tablet by mouth two times a day 4)  Nasacort Aq 55 Mcg/act Aers (Triamcinolone acetonide(nasal)) .... Inhale 2 sprays in each nostril as directed once a day 5)  Celexa 40 Mg Tabs (Citalopram hydrobromide) .... Take one tab by mouth daily 6)  Clarinex-d 24 Hour 5-240 Mg Xr24h-tab (Desloratadine-pseudoephedrine) .... One by mouth daily for allergies 7)  Protonix 40 Mg Tbec (Pantoprazole sodium) .... One by mouth daily 8)  Ventolin Hfa 108 (90 Base) Mcg/act Aers (Albuterol sulfate) .... 9 gm mdi for $9 2 puffs qid as needed for wheeze 9)  Percocet 10-325 Mg Tabs (Oxycodone-acetaminophen) .Marland Kitchen.. 1 by mouth q6hr as needed severe pain. 10)  Nitrostat 0.4 Mg Subl (Nitroglycerin) .... One by mouth as needed for chest pain; may repeat 2 more times every 5 minutes; if chest pain does not resolve after three doses, call 911 11)  Methotrexate 2.5 Mg Tabs (Methotrexate sodium) .... 6 tabs by mouth every friday.  disp 1 mo supply 12)  Folic Acid 1 Mg Tabs (Folic acid) .Marland Kitchen.. 1 by mouth once daily every day except friday. 13)  Klonopin 0.5 Mg Tabs (Clonazepam) .Marland Kitchen.. 1 tab by mouthtid as needed for anxiety 14)  Zyrtec Allergy 10 Mg Caps (Cetirizine hcl) .Marland Kitchen.. 1 tab at bedtime 15)  Prednisone 50 Mg Tabs (Prednisone) .... One tab by mouth x 5 days  Patient Instructions: 1)  Follow up with Dr. Katrinka Blazing as scheduled. 2)  Pick up the prednisone and take for this pain flare.  3)  Continue your other medications as before. Prescriptions: PREDNISONE 50 MG TABS (PREDNISONE) one tab by mouth x 5 days  #5 x 0    Entered and Authorized by:   Bobby Rumpf  MD   Signed by:   Bobby Rumpf  MD on 03/20/2010   Method used:   Electronically to        [facility] [address] [phone]* (retail)       [address]       [place], [place]  [id number]       Ph: [phone] or [phone]       Fax: [phone]   RxID:   [id number]

## 2010-07-31 NOTE — Assessment & Plan Note (Signed)
Summary: f/up,tcb   Vital Signs:  Patient profile:   60 year old female Height:      63 inches Weight:      172 pounds BMI:     30.58 Temp:     98.4 degrees F oral Pulse rate:   98 / minute BP sitting:   128 / 91  (right arm) Cuff size:   regular  Vitals Entered By: Tessie Fass CMA (April 26, 2010 2:29 PM) CC: F/U Is Patient Diabetic? Yes   Primary Care Provider:  Antoine Primas DO  CC:  F/U.  History of Present Illness: 1. RA-  .  Pt states the pain in her joints actually overall maybe improving slowly since starting the MTX. Has been on same dose no side effects except maybe dry skin.  Pt is also taking the percocet wich helps.   Pt is due for labs today.   2.  Anxiety-  Pt granddaughter is still in and out of behavioral health and she is taking care of her greatgrandchild daily, doing well, does need chronic benzo use, has been taking it for years. Does not feel ready to decrease dose.   3.  glucose low:110 Glucose high:130 Last A1C:8.0 Taking Meds:yes On INsulin:no Side effects:no ROS: Denies polyuria, polydipsia, visual changes numbness in extremities, foot ulcers Lifestyle modifications: has been trying to eat better.  Pt was supposed to be referred to an opthomologist but is still having  to wait.  Pt does have [employer] now.    4.  Asthma-  Well controlled no nightime awakenings has not needed her rescue inhale r for some time but would like to get a flu shot soon  5.  bilateral shoulder pain-  pt has had dx of MRi of torn left labrum.  pt does not want to have surgery.  had a injection about 6 months ago and since then pain was dong great. now hurting again especially when picking up her grandchildren.   Habits & Providers  Alcohol-Tobacco-Diet     Tobacco Status: quit  Current Medications (verified): 1)  Bayer Childrens Aspirin 81 Mg Chew (Aspirin) .... Take 1 Tablet By Mouth Once A Day 2)  Crestor 10 Mg Tabs (Rosuvastatin Calcium) .... Take 1 Tablet By  Mouth At Bedtime 3)  Metformin Hcl 1000 Mg Tabs (Metformin Hcl) .... Take 1 Tablet By Mouth Two Times A Day 4)  Nasacort Aq 55 Mcg/act Aers (Triamcinolone Acetonide(Nasal)) .... Inhale 2 Sprays in Each Nostril As Directed Once A Day 5)  Celexa 40 Mg  Tabs (Citalopram Hydrobromide) .... Take One Tab By Mouth Daily 6)  Clarinex-D 24 Hour 5-240 Mg Xr24h-Tab (Desloratadine-Pseudoephedrine) .... One By Mouth Daily For Allergies 7)  Protonix 40 Mg Tbec (Pantoprazole Sodium) .... One By Mouth Daily 8)  Ventolin Hfa 108 (90 Base) Mcg/act Aers (Albuterol Sulfate) .... 9 Gm Mdi For $9 2 Puffs Qid As Needed For Wheeze 9)  Percocet 10-325 Mg Tabs (Oxycodone-Acetaminophen) .Marland Kitchen.. 1 By Mouth Q6hr As Needed Severe Pain. 10)  Nitrostat 0.4 Mg Subl (Nitroglycerin) .... One By Mouth As Needed For Chest Pain; May Repeat 2 More Times Every 5 Minutes; If Chest Pain Does Not Resolve After Three Doses, Call 911 11)  Methotrexate 2.5 Mg Tabs (Methotrexate Sodium) .... 6 Tabs By Mouth Every Friday.  Disp 1 Mo Supply 12)  Folic Acid 1 Mg Tabs (Folic Acid) .Marland Kitchen.. 1 By Mouth Once Daily Every Day Except Friday. 13)  Klonopin 0.5 Mg Tabs (Clonazepam) .Marland Kitchen.. 1 Tab  By Mouthtid As Needed For Anxiety 14)  Zyrtec Allergy 10 Mg Caps (Cetirizine Hcl) .Marland Kitchen.. 1 Tab At Bedtime 15)  Fluconazole 100 Mg Tabs (Fluconazole) .... Take 1 Tab By Mouth 16)  Diflucan 100 Mg Tabs (Fluconazole) .... Take 1 Tablet By Mouth  Allergies (verified): 1)  ! Sulfa  Past History:  Past medical, surgical, family and social histories (including risk factors) reviewed, and no changes noted (except as noted below).  Past Medical History: Reviewed history from 01/08/2010 and no changes required. RA- CCP positive, will be followed at [facility] HLD DM Anxiety Asthma Allergic Rhinitis 2D ECHO 10/99 = EF 55% Negative r/o MI w/u 2022/11/08 L rotator cuff tear (old) - diagnosed by MRI 2/11 - steroid injection 08/30/09 negative r/u MI w/u 2/11  Past Surgical  History: Reviewed history from 08/23/2009 and no changes required. Appendectomy - 07/05/2004 Bone spur removal left foot - 07/05/2004 Cholecystectomy - 07/05/2004 Colonoscopy-polypectomy - 07/01/1994 Hysterectomy-complete - 07/05/2004 Right carpal tunnel release - 07/05/2004 Right knee surg x2 - 07/05/2004 UGI Series w/ SB followthru-mild GERD - 01/30/1999  Family History: Reviewed history from 12/14/2009 and no changes required. Dad died at 28 2/2 CVA, MI; he also had DM,  Mom died at 85 2/2 complications associated w/ PNA, One brother healthy (69),  One brother with inoperable lung CA (49)-died Nov 08, 2022, One sister with DM (57), One sister with RA (56) treated with plaquenil, prednisone, MTX Three kids all healthy  Social History: Reviewed history from 11/20/2009 and no changes required. 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 On the MAP program. Lives in trailer. Uses wood for heat. Stressful family situation. 61 yo dtr just had a baby 08/30/09. Lives with Morrie Sheldon (oldest grandchild) and her husband. Morrie Sheldon has intellectual disabilitySmoking Status:  quit  Review of Systems       see hpi  Physical Exam  General:  vitals signs reviewed -- WNL.   obese.  well hydrated Eyes:  conjunctivae pink, sclerae clear  Mouth:  oropharynx pink, moist; no erythema  Lungs:  work of breathing unlabored, clear to auscultation bilaterally; no wheezes, rales, or ronchi; good air movement throughout  Heart:  regular rate and rhythm, no murmurs; normal s1/s2  Abdomen:  soft, non-tender, normal bowel sounds, and no distention.   Msk:  hands have no swelling b/l, mildly tender to palpation b/l full ROM, mild crepitus of wrist joint on right.   sholders b/l mild decrease in active rom.  + hawkins and neer test R>L.  crepitus b/l no swelling or gross defomity.  distal pulses and NVI.  Pulses:  DP and  radial pulses 2+ bilaterally  Extremities:  no edema Neurologic:  CN 2-12 intact Skin:  pt has worsening dry skin everywhere, no exfoliation seen, mild AK on face    Impression & Recommendations:  Problem # 1:  RHEUMATOID ARTHRITIS (ICD-714.0) OCntinue current regimen, will check labs and continue checking every 3-6 months.  Her updated medication list for this problem includes:    Bayer Childrens Aspirin 81 Mg Chew (Aspirin) .Marland Kitchen... Take 1 tablet by mouth once a day    Methotrexate 2.5 Mg Tabs (Methotrexate sodium) .Marland KitchenMarland KitchenMarland KitchenMarland Kitchen 6 tabs by mouth every friday.  disp 1 mo supply  Orders: [facility]- Est  Level 4 (16109)  Problem # 2:  CHEST PAIN, ATYPICAL (ICD-786.59) msk in nature.  no need to worry  about cardiac, will monitor closely.   Problem # 3:  ROTATOR CUFF TEAR (ICD-727.61) jected both shoulders today.  Pt tolerated it well, ue]sed kenolog 40 2cc and lidocaine 3cc in each shoulder.   warned pt it would increase  Orders: [facility]- Est  Level 4 (99214) Injection, intermediate joint - [facility] (16109)  Problem # 4:  DIABETES MELLITUS II, UNCOMPLICATED (ICD-250.00) mild improvement in A1C,, no change in management at this time would like to get A1C to closer to 7.  Hopefully will continue. Educated about diet changes and exercise.  Her updated medication list for this problem includes:    Bayer Childrens Aspirin 81 Mg Chew (Aspirin) .Marland Kitchen... Take 1 tablet by mouth once a day    Metformin Hcl 1000 Mg Tabs (Metformin hcl) .Marland Kitchen... Take 1 tablet by mouth two times a day  Orders: Comp Met-[facility] [phone]) CBC w/Diff-[facility] (91478) A1C-[facility] (29562) [facility]- Est  Level 4 (13086)  Labs Reviewed: Creat: 0.56 (01/29/2010)   Microalbumin: NEGATVIE (07/13/2007) Reviewed HgBA1c results: 7.8 (04/26/2010)  8.4 (02/06/2010)  Complete Medication List: 1)  Bayer Childrens Aspirin 81 Mg Chew (Aspirin) .... Take 1 tablet by mouth once a day 2)  Crestor 10 Mg Tabs (Rosuvastatin calcium) .... Take 1 tablet by mouth at  bedtime 3)  Metformin Hcl 1000 Mg Tabs (Metformin hcl) .... Take 1 tablet by mouth two times a day 4)  Nasacort Aq 55 Mcg/act Aers (Triamcinolone acetonide(nasal)) .... Inhale 2 sprays in each nostril as directed once a day 5)  Celexa 40 Mg Tabs (Citalopram hydrobromide) .... Take one tab by mouth daily 6)  Clarinex-d 24 Hour 5-240 Mg Xr24h-tab (Desloratadine-pseudoephedrine) .... One by mouth daily for allergies 7)  Protonix 40 Mg Tbec (Pantoprazole sodium) .... One by mouth daily 8)  Ventolin Hfa 108 (90 Base) Mcg/act Aers (Albuterol sulfate) .... 9 gm mdi for $9 2 puffs qid as needed for wheeze 9)  Percocet 10-325 Mg Tabs (Oxycodone-acetaminophen) .Marland Kitchen.. 1 by mouth q6hr as needed severe pain. 10)  Nitrostat 0.4 Mg Subl (Nitroglycerin) .... One by mouth as needed for chest pain; may repeat 2 more times every 5 minutes; if chest pain does not resolve after three doses, call 911 11)  Methotrexate 2.5 Mg Tabs (Methotrexate sodium) .... 6 tabs by mouth every friday.  disp 1 mo supply 12)  Folic Acid 1 Mg Tabs (Folic acid) .Marland Kitchen.. 1 by mouth once daily every day except friday. 13)  Klonopin 0.5 Mg Tabs (Clonazepam) .Marland Kitchen.. 1 tab by mouthtid as needed for anxiety 14)  Zyrtec Allergy 10 Mg Caps (Cetirizine hcl) .Marland Kitchen.. 1 tab at bedtime 15)  Fluconazole 100 Mg Tabs (Fluconazole) .... Take 1 tab by mouth 16)  Diflucan 100 Mg Tabs (Fluconazole) .... Take 1 tablet by mouth  Other Orders: Dental Referral (Dentist) Prescriptions: VENTOLIN HFA 108 (90 BASE) MCG/ACT AERS (ALBUTEROL SULFATE) 9 GM MDI for $9 2 puffs qid as needed for wheeze  #1 x 3   Entered and Authorized by:   Antoine Primas DO   Signed by:   Antoine Primas DO on 04/26/2010   Method used:   Faxed to ...       [facility] (retail)       [address] [place], [place]  [address]       Ph: [phone]       Fax: [phone]   RxID:   [id number] VENTOLIN HFA 108 (90 BASE) MCG/ACT AERS (ALBUTEROL SULFATE) 9 GM MDI  for $9 2 puffs qid as needed for wheeze  #2 x 3   Entered and Authorized by:   Antoine Primas DO   Signed by:   Antoine Primas DO on 04/26/2010   Method used:   [facility]        [facility] [address] [phone]* (retail)       [address]       [place], [place]  [address]       Ph: [phone] or [phone]       Fax: [phone]   RxID:   [id number] CELEXA 40 MG  TABS (CITALOPRAM HYDROBROMIDE) take one tab by mouth daily  #30 x 3   Entered and Authorized by:   Antoine Primas DO   Signed by:   Antoine Primas DO on 04/26/2010   Method used:   Electronically to        [name] [phone]* (retail)       [address]       [place], [place]  [id number]       Ph: [phone] or [phone]       Fax: [phone]   RxID:   [id number] CRESTOR 10 MG TABS (ROSUVASTATIN CALCIUM) Take 1 tablet by mouth at bedtime  #90 x 3   Entered and Authorized by:   Antoine Primas DO   Signed by:   Antoine Primas DO on 04/26/2010   Method used:   Electronically to        [name] [phone]* (retail)       [address]       [place], [place]  [id number]       Ph: [phone] or [phone]       Fax: [phone]   RxID:   [id number] BAYER CHILDRENS ASPIRIN 81 MG CHEW (ASPIRIN) Take 1 tablet by mouth once a day  #30 x 3   Entered and Authorized by:   Antoine Primas DO   Signed by:   Antoine Primas DO on 04/26/2010   Method used:   Electronically to        [name] [phone]* (retail)       [address]       [place], [place]  [address]       Ph: [phone] or [phone]       Fax: [phone]   RxID:   [id number]    Orders Added: 1)  Comp Met-[facility] [[id number]] 2)  CBC w/Diff-[facility] [85025] 3)  Dental Referral [Dentist] 4)  A1C-[facility] [83036] 5)  [facility]- Est  Level 4 [24268] 6)  Injection, intermediate joint - [facility]  [20605]    Laboratory Results   Blood Tests   Date/Time Received: April 26, 2010 3:39 PM  Date/Time Reported: April 26, 2010 4:10 PM   HGBA1C: 7.8%   (Normal Range: Non-Diabetic - 3-6%   Control Diabetic - 6-8%)  Comments: ...............test performed by......Marland KitchenBonnie A. Swazil[place], MLS (ASCP)cm

## 2010-07-31 NOTE — Assessment & Plan Note (Signed)
Summary: see triage note/[place]   Vital Signs:  Patient profile:   60 year old female O2 Sat:      94 % on Room air Temp:     97.2 degrees F oral Pulse rate:   91 / minute BP sitting:   150 / 97  (right arm) Cuff size:   regular  Vitals Entered By: Arlyss Repress CMA, (August 14, 2009 9:08 AM)  O2 Flow:  Room air  Serial Vital Signs/Assessments:  Time      Position  BP       Pulse  Resp  Temp     By                     130/78                         Asher Muir MD  CC: chest and shoulder pain Pain Assessment Patient in pain? yes     Location: left arm/chest Intensity: 9 Onset of pain  x2 days.   Primary Care Provider:  Myrtie Soman  MD  CC:  chest and shoulder pain.  History of Present Illness: 1.  chest pain and left shoulder pain--started yesterday evening.  went to the dumpster and threw away 2 trash bags.  about 2 hours later, left shoulder started hurting.  had difficulty sleeping.  coughing, moving arm, laying on it makes it worse.  nothing makes it better.  tried tylenol, which helped a little.  upon further questioning, admits to pain in her chest as well.  it is difficult to differentiate between the chest and shoulder pain.  pain is throbbing.   accompanied by mild sob,  nauseated, no vomitting.  strong family hx of heart disease.    Current Medications (verified): 1)  Bayer Childrens Aspirin 81 Mg Chew (Aspirin) .... Take 1 Tablet By Mouth Once A Day 2)  Crestor 10 Mg Tabs (Rosuvastatin Calcium) .... Take 1 Tablet By Mouth At Bedtime 3)  Metformin Hcl 1000 Mg Tabs (Metformin Hcl) .... Take 1 Tablet By Mouth Two Times A Day 4)  Nasacort Aq 55 Mcg/act Aers (Triamcinolone Acetonide(Nasal)) .... Inhale 2 Sprays in Each Nostril As Directed Once A Day 5)  Celexa 40 Mg  Tabs (Citalopram Hydrobromide) .... Take One Tab By Mouth Daily 6)  Allegra-D 12 Hour 60-120 Mg Xr12h-Tab (Fexofenadine-Pseudoephedrine) .... One By Mouth Two Times A Day 7)  Klonopin 0.5  Mg  Tabs (Clonazepam) .... Take One Tablet Two Times A Day As Needed For Anxiety 8)  Protonix 40 Mg Tbec (Pantoprazole Sodium) .... One By Mouth Daily 9)  Ventolin Hfa 108 (90 Base) Mcg/act Aers (Albuterol Sulfate) .... 9 Gm Mdi For $9 2 Puffs Qid As Needed For Wheeze 10)  Meloxicam 7.5 Mg Tabs (Meloxicam) .... Take One By Mouth Daily With Food For Knee Pain  Allergies: 1)  ! Sulfa  Past History:  Past Medical History: Reviewed history from 07/13/2007 and no changes required. HLD DM Anxiety Asthma Allergic Rhinitis 2D ECHO 10/99 = EF 55% Negative r/o MI w/u 5/06  Past Surgical History: Reviewed history from 08/28/2006 and no changes required. 05/07/06 ldl: 79 direct - 05/09/2006, Appendectomy - 07/05/2004, Bone spur removal left foot - 07/05/2004, Cholecystectomy - 07/05/2004, Colonoscopy-polypectomy - 07/01/1994, Hysterectomy-complete - 07/05/2004, Right carpal tunnel release - 07/05/2004, Right knee surg x2 - 07/05/2004, UGI Series w/ SB followthru-mild GERD - 01/30/1999  Family History: Reviewed history from 08/28/2006 and no changes  required. Dad died at 3 2/2 CVA, MI; he also had DM, Mom died at 14 2/2 complications associated w/ PNA, One brother healthy (76), One brother with inoperable lung CA (49)-died 2022-11-11, One sister with DM (49), One sister with RA (56), Three kids all healthy  Social History: Reviewed history from 07/05/2009 and no changes required. 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 On the MAP program. Lives in trailer. Uses wood for heat. Stressful family situation. 60 yo dtr newly pregnant (due march 2011)  Review of Systems General:  Complains of sweats; denies fever. CV:  Complains of chest pain or discomfort and shortness of breath with exertion; denies bluish discoloration of lips or nails, palpitations, and swelling of feet. Resp:  Complains of chest  discomfort and chest pain with inspiration; denies cough.  Physical Exam  General:  appears very uncomfortable Eyes:  normal appearance Chest Wall:  very ttp on left chest Lungs:  Normal respiratory effort, chest expands symmetrically. Lungs are clear to auscultation, no crackles or wheezes. Heart:  Normal rate and regular rhythm. S1 and S2 normal without gallop, murmur, click, rub or other extra sounds. Abdomen:  soft, non-tender, normal bowel sounds, and no distention.   Msk:  holding left arm to chest.  can only elevate left arm about 15 degrees, which causes excruciating pain.  sensation grossly intact.  grip strength 5/5 on right 4+/5 on left Extremities:  trace le edema Neurologic:  alert & oriented X3.   Psych:  anxious, in pain Additional Exam:  vital signs reviewed    Impression & Recommendations:  Problem # 1:  CHEST PAIN (ICD-786.50) Assessment New EKG normal.  I think it is very likely that this is mostly referred pain from her shoulder, but given her risk factors, feel that admission for rule out with enzymes and EKG is necessary.  Plan:  check cardiac enzymes, am EKG.  ASA given at [facility].  give beta blocker.  nitroglycerin as needed. will also give morphine for pain.  check d-ldl.  Probably needs stress test in  the near future.   Orders: EKG- [facility] (EKG) Hospital Admit-[facility] (00000)  Problem # 2:  SHOULDER PAIN, LEFT (ICD-719.41) Assessment: New  clearly this is quite painful.  not clear etiology.  no obvious trauma that would cause a tear.  think it may be easier to get a good exam and sort this out once her pain is under better control.  if she really cannot lift her arm at all after pain better controlled, probably needs mri.   Her updated medication list for this problem includes:    Bayer Childrens Aspirin 81 Mg Chew (Aspirin) .Marland Kitchen... Take 1 tablet by mouth once a day    Meloxicam 7.5 Mg Tabs (Meloxicam) .Marland Kitchen... Take one by mouth daily with food for knee  pain  Orders: Hospital Admit-[facility] (00000)  Problem # 3:  GERD (ICD-530.81) Assessment: Unchanged  Her updated medication list for this problem includes:    Protonix 40 Mg Tbec (Pantoprazole sodium) ..... One by mouth daily  Problem # 4:  ELEVATED BLOOD PRESSURE WITHOUT DIAGNOSIS OF HYPERTENSION (ICD-796.2) Assessment: Unchanged  beta blocker for potential ACS.  ultimately, needs to be on an ACEI because of her dm  Orders: Hospital Admit-[facility] (00000)  Problem # 5:  DIABETES MELLITUS II, UNCOMPLICATED (ICD-250.00) Assessment: Unchanged  has been well controlled.  think she would benefit from an acei.  consider starting low dose this hospitalization.  for now, will hold metformin in case she needs cath.  do sensitive SSI.   Her updated medication list for this problem includes:    Bayer Childrens Aspirin 81 Mg Chew (Aspirin) .Marland Kitchen... Take 1 tablet by mouth once a day    Metformin Hcl 1000 Mg Tabs (Metformin hcl) .Marland Kitchen... Take 1 tablet by mouth two times a day  Orders: Hospital Admit-[facility] (00000)  Problem # 6:  ASTHMA, MILD, INTERMITTENT (ICD-493.90) Assessment: Unchanged  Her updated medication list for this problem includes:    Ventolin Hfa 108 (90 Base) Mcg/act Aers (Albuterol sulfate) .Marland Kitchen... 9 gm mdi for $9 2 puffs qid as needed for wheeze  Orders: Pulse Oximetry- [facility] (94760)  Problem # 7:  prophylaxis heparin and protonix  Problem # 8:  fengi diabetic diet. saline lock IV  Complete Medication List: 1)  Bayer Childrens Aspirin 81 Mg Chew (Aspirin) .... Take 1 tablet by mouth once a day 2)  Crestor 10 Mg Tabs (Rosuvastatin calcium) .... Take 1 tablet by mouth at bedtime 3)  Metformin Hcl 1000 Mg Tabs (Metformin hcl) .... Take 1 tablet by mouth two times a day 4)  Nasacort Aq 55 Mcg/act Aers (Triamcinolone acetonide(nasal)) .... Inhale 2 sprays in each nostril as directed once a day 5)  Celexa 40 Mg Tabs (Citalopram hydrobromide) .... Take one tab by mouth daily 6)  Allegra-d  12 Hour 60-120 Mg Xr12h-tab (Fexofenadine-pseudoephedrine) .... One by mouth two times a day 7)  Klonopin 0.5 Mg Tabs (Clonazepam) .... Take one tablet two times a day as needed for anxiety 8)  Protonix 40 Mg Tbec (Pantoprazole sodium) .... One by mouth daily 9)  Ventolin Hfa 108 (90 Base) Mcg/act Aers (Albuterol sulfate) .... 9 gm mdi for $9 2 puffs qid as needed for wheeze 10)  Meloxicam 7.5 Mg Tabs (Meloxicam) .... Take one by mouth daily with food for knee pain  Appended Document: see triage note/[place]     Allergies: 1)  ! Sulfa   Complete Medication List: 1)  Bayer Childrens Aspirin 81 Mg Chew (Aspirin) .... Take 1 tablet by mouth once a day 2)  Crestor 10 Mg Tabs (Rosuvastatin calcium) .... Take 1 tablet by mouth at bedtime 3)  Metformin Hcl 1000 Mg Tabs (Metformin hcl) .... Take 1 tablet by mouth two times a day 4)  Nasacort Aq 55 Mcg/act Aers (Triamcinolone acetonide(nasal)) .... Inhale 2 sprays in each nostril as directed once a day 5)  Celexa 40 Mg Tabs (Citalopram hydrobromide) .... Take one tab by mouth daily 6)  Allegra-d 12 Hour 60-120 Mg Xr12h-tab (Fexofenadine-pseudoephedrine) .... One by mouth two times a day 7)  Klonopin 0.5 Mg Tabs (Clonazepam) .... Take one tablet two times a day as needed for anxiety 8)  Protonix 40 Mg Tbec (Pantoprazole sodium) .... One by mouth daily 9)  Ventolin Hfa 108 (90 Base) Mcg/act Aers (Albuterol sulfate) .... 9 gm mdi for $9 2 puffs qid as needed for wheeze 10)  Meloxicam 7.5 Mg Tabs (Meloxicam) .... Take one by mouth daily with food for knee pain  Other Orders: ASA 325mg  tab (EMRORAL) NTG 1/150 gr tab [facility])    Medication Administration  Medication # 1:    Medication: ASA 325mg  tab    Diagnosis: CHEST PAIN (ICD-786.50)    Dose: 1 tablet    Route: po    Exp Date: 07/20/2010    Lot #: [id number]  Mfr: major    Patient tolerated medication without complications    Given by: Arlyss Repress CMA, (August 14, 2009 10:34  AM)  Medication # 2:    Medication: NTG 1/150 gr tab    Diagnosis: CHEST PAIN (ICD-786.50)    Dose: 1tablets    Route: SL    Exp Date: 11/30/2010    Lot #: [id number]    Mfr: [email or web address]    Patient tolerated medication without complications    Given by: Arlyss Repress CMA, (August 14, 2009 10:37 AM)  Orders Added: 1)  ASA 325mg  tab [EMRORAL] 2)  NTG 1/150 gr tab [EMRORAL]

## 2010-07-31 NOTE — Progress Notes (Signed)
Summary: phnmsg  Phone Note Call from Patient Call back at Home Phone [phone]   Caller: Patient Summary of Call: pt will take a weeks worth of prednisone - she is in a lot of pain [facility] [address] Initial call taken by: De Nurse,  February 12, 2010 4:47 PM  Follow-up for Phone Call        states she does not like prednisone but is in so much pain she is willing. last OV 02/06/10. told her I will call her in am with md response Follow-up by: [name] RN,  February 12, 2010 5:01 PM  Additional Follow-up for Phone Call Additional follow up Details #1::        can we callpt and tell her sent her in a weeks worth and can pick it up at her pharmacy. If pain does not iprove should be seen by friday.  Additional Follow-up by: Antoine Primas DO,  February 13, 2010 9:24 AM    New/Updated Medications: PREDNISONE 20 MG TABS (PREDNISONE) take 2 tabs daily for next 7 days Prescriptions: PREDNISONE 20 MG TABS (PREDNISONE) take 2 tabs daily for next 7 days  #14 x 0   Entered and Authorized by:   Antoine Primas DO   Signed by:   Antoine Primas DO on 02/13/2010   Method used:   [facility]ronically to        [facility]  [name] [phone]* (retail)       [address]       [place], [place]  [id number]       Ph: [phone] or [phone]       Fax: [phone]   RxID:   [id number]   Appended Document: phnmsg LM that rx was at her pharmacy & asked her to call me back. want to tell her that if she has no improvement by Friday AM, call for appt  Appended Document: phnmsg informed pt to call fri am if she is not improved

## 2010-07-31 NOTE — Miscellaneous (Signed)
Summary: yeast problem  Clinical Lists Changes  RN called patient to  make sure she received message about appointment at [facility] with Rheumatology . she is inquiring  if RX for yeast infection was called to pharmacy . states she discussed with triage nurse yesterday. ( see message form yesterday.) do not see that RX was sent in . will send message to Dr. Rexene Alberts. states she will be on prednisone for 14 more days. Theresia Lo RN  December 19, 2009 9:53 AM  d/w patient. will treat with diflucan. Myrtie Soman  MD  December 20, 2009 11:32 AM   Prescriptions: FLUCONAZOLE 150 MG TABS (FLUCONAZOLE) one by mouth x 1; repeat in 3 days  #2 x 0   Entered and Authorized by:   Myrtie Soman  MD   Signed by:   Myrtie Soman  MD on 12/20/2009   Method used:   Faxed to ...       [facility] (retail)       [address] [place], [place]  [address]       Ph: [phone]       Fax: [phone]   RxID:   [id number]

## 2010-07-31 NOTE — Progress Notes (Signed)
Summary: ROI  ROI   Imported By: Marily Memos 05/17/2010 09:22:58  _____________________________________________________________________  External Attachment:    Type:   Image     Comment:   External Document

## 2010-07-31 NOTE — Consult Note (Signed)
Summary: Dr Christophe Louis- Optometrist  Dr Christophe Louis- Optometrist   Imported By: De Nurse 04/26/2010 12:05:08  _____________________________________________________________________  External Attachment:    Type:   Image     Comment:   External Document

## 2010-07-31 NOTE — Miscellaneous (Signed)
  Clinical Lists Changes  Problems: Changed problem from ASTHMA, MILD, INTERMITTENT (ICD-493.90) to ASTHMA, INTERMITTENT (ICD-493.90)

## 2010-07-31 NOTE — Assessment & Plan Note (Signed)
Summary: f/u visit/bmc   Vital Signs:  Patient profile:   60 year old female Height:      63 inches Weight:      168 pounds BMI:     29.87 BSA:     1.80 Temp:     98.0 degrees F Pulse rate:   76 / minute BP sitting:   135 / 82  Vitals Entered By: Jone Baseman CMA (March 27, 2010 9:45 AM) CC: f/u Is Patient Diabetic? No Pain Assessment Patient in pain? no        Primary Care Vale Mousseau:  Antoine Primas DO  CC:  f/u.  History of Present Illness: 1. RA-  Pt is now done with her prednisione burst she was given by Dr. Wallene Huh last week, hands are much better able to move them well and do all ADL's.  Pt states the pain in her joints actually overall maybe improving slowly since starting the MTX.  Pt is also taking the percocet wich helps.     2.  Anxiety-  Pt granddaughter is still in and out of behavioral health and she is taking care of her greatgrandchild daily, doing well, but is sometimes feels liek she needs to pull out her hair. Pt states the klonipin is working well and the amount is sufficient for the month.   3.  glucose low:120 Glucose high:136 Last A1C:8.2 Taking Meds:yes On INsulin:no Side effects:no ROS: Denies polyuria, polydipsia, visual changes numbness in extremities, foot ulcers Lifestyle modifications: has been trying to eat better.  Pt was supposed to be referred to an opthomologist but is still having  to wait.  Pt does have [employer] now.    4.  Asthma-  Well controlled no nightime awakenings has not needed her rescue inhale r for some time but would like to get a flu shot soon  Habits & Providers  Alcohol-Tobacco-Diet     Tobacco Status: never     Year Quit: 16 years  Current Medications (verified): 1)  Bayer Childrens Aspirin 81 Mg Chew (Aspirin) .... Take 1 Tablet By Mouth Once A Day 2)  Crestor 10 Mg Tabs (Rosuvastatin Calcium) .... Take 1 Tablet By Mouth At Bedtime 3)  Metformin Hcl 1000 Mg Tabs (Metformin Hcl) .... Take 1 Tablet By Mouth  Two Times A Day 4)  Nasacort Aq 55 Mcg/act Aers (Triamcinolone Acetonide(Nasal)) .... Inhale 2 Sprays in Each Nostril As Directed Once A Day 5)  Celexa 40 Mg  Tabs (Citalopram Hydrobromide) .... Take One Tab By Mouth Daily 6)  Clarinex-D 24 Hour 5-240 Mg Xr24h-Tab (Desloratadine-Pseudoephedrine) .... One By Mouth Daily For Allergies 7)  Protonix 40 Mg Tbec (Pantoprazole Sodium) .... One By Mouth Daily 8)  Ventolin Hfa 108 (90 Base) Mcg/act Aers (Albuterol Sulfate) .... 9 Gm Mdi For $9 2 Puffs Qid As Needed For Wheeze 9)  Percocet 10-325 Mg Tabs (Oxycodone-Acetaminophen) .Marland Kitchen.. 1 By Mouth Q6hr As Needed Severe Pain. 10)  Nitrostat 0.4 Mg Subl (Nitroglycerin) .... One By Mouth As Needed For Chest Pain; May Repeat 2 More Times Every 5 Minutes; If Chest Pain Does Not Resolve After Three Doses, Call 911 11)  Methotrexate 2.5 Mg Tabs (Methotrexate Sodium) .... 6 Tabs By Mouth Every Friday.  Disp 1 Mo Supply 12)  Folic Acid 1 Mg Tabs (Folic Acid) .Marland Kitchen.. 1 By Mouth Once Daily Every Day Except Friday. 13)  Klonopin 0.5 Mg Tabs (Clonazepam) .Marland Kitchen.. 1 Tab By Mouthtid As Needed For Anxiety 14)  Zyrtec Allergy 10 Mg Caps (Cetirizine  Hcl) .... 1 Tab At Bedtime 15)  Fluconazole 100 Mg Tabs (Fluconazole) .... Take 1 Tab By Mouth  Allergies (verified): 1)  ! Sulfa  Past History:  Past medical, surgical, family and social histories (including risk factors) reviewed, and no changes noted (except as noted below).  Past Medical History: Reviewed history from 01/08/2010 and no changes required. RA- CCP positive, will be followed at [facility] HLD DM Anxiety Asthma Allergic Rhinitis 2D ECHO 10/99 = EF 55% Negative r/o MI w/u 11-29-2022 L rotator cuff tear (old) - diagnosed by MRI 2/11 - steroid injection 08/30/09 negative r/u MI w/u 2/11  Past Surgical History: Reviewed history from 08/23/2009 and no changes required. Appendectomy - 07/05/2004 Bone spur removal left foot - 07/05/2004 Cholecystectomy -  07/05/2004 Colonoscopy-polypectomy - 07/01/1994 Hysterectomy-complete - 07/05/2004 Right carpal tunnel release - 07/05/2004 Right knee surg x2 - 07/05/2004 UGI Series w/ SB followthru-mild GERD - 01/30/1999  Family History: Reviewed history from 12/14/2009 and no changes required. Dad died at 41 2/2 CVA, MI; he also had DM,  Mom died at 51 2/2 complications associated w/ PNA, One brother healthy (93),  One brother with inoperable lung CA (49)-died 29-Nov-2022, One sister with DM (35), One sister with RA (56) treated with plaquenil, prednisone, MTX Three kids all healthy  Social History: Reviewed history from 11/20/2009 and no changes required. 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 On the MAP program. Lives in trailer. Uses wood for heat. Stressful family situation. 61 yo dtr just had a baby 08/30/09. Lives with Morrie Sheldon (oldest grandchild) and her husband. Morrie Sheldon has intellectual disability  Review of Systems       see hpi  Physical Exam  General:  vitals signs reviewed -- WNL.  appears uncomfortable.  obese.  well hydrated Eyes:  conjunctivae pink, sclerae clear  Mouth:  oropharynx pink, moist; no erythema  Lungs:  work of breathing unlabored, clear to auscultation bilaterally; no wheezes, rales, or ronchi; good air movement throughout  Heart:  regular rate and rhythm, no murmurs; normal s1/s2  Abdomen:  soft, non-tender, normal bowel sounds, and no distention.   Msk:  hands have no swelling b/l, mildly tender to palpation b/l full ROM, mild crepitus of wrist joint on right.  Pulses:  DP and radial pulses 2+ bilaterally  Extremities:  no edema   Impression & Recommendations:  Problem # 1:  RHEUMATOID ARTHRITIS (ICD-714.0)  seems controleld, due to the cost of her last visit with the RA will have pt continue to be seen by [place] and if MTX seems to not be working thenwill have  pt f/u with them again. Will see again next month.  The following medications were removed from the medication list:    Prednisone 50 Mg Tabs (Prednisone) ..... One tab by mouth x 5 days Her updated medication list for this problem includes:    Bayer Childrens Aspirin 81 Mg Chew (Aspirin) .Marland Kitchen... Take 1 tablet by mouth once a day    Methotrexate 2.5 Mg Tabs (Methotrexate sodium) .Marland KitchenMarland KitchenMarland KitchenMarland Kitchen 6 tabs by mouth every friday.  disp 1 mo supply  Orders: [facility]- Est  Level 4 ([address])  Diagnostics Reviewed:  Hgb: 12.4 (01/29/2010)   HCT: 39.5 (01/29/2010)   Platelets: 325 (01/29/2010) RBC: 4.19 (01/29/2010)   WBC: 8.4 (01/29/2010) SGOT (AST): 12 (01/29/2010)   SGPT (ALT): 16 (01/29/2010)   T. Bili:  0.4 (01/29/2010)   D. Bili: 0.1 (12/14/2009)   Alk Phos: 62 (01/29/2010) Albumin: 4.1 (01/29/2010)   RF: < 20 IU/mL (12/08/2009)     Problem # 2:  ASTHMA, INTERMITTENT (ICD-493.90) pt seems to be doing well, will get flu shot at next appointment.  The following medications were removed from the medication list:    Prednisone 50 Mg Tabs (Prednisone) ..... One tab by mouth x 5 days Her updated medication list for this problem includes:    Ventolin Hfa 108 (90 Base) Mcg/act Aers (Albuterol sulfate) .Marland Kitchen... 9 gm mdi for $9 2 puffs qid as needed for wheeze  Orders: [facility]- Est  Level 4 ([address])  Problem # 3:  DIABETES MELLITUS II, UNCOMPLICATED (ICD-250.00) Pt states much better control with values in the 120-130's all the time.  A1C due in [place].   Her updated medication list for this problem includes:    Bayer Childrens Aspirin 81 Mg Chew (Aspirin) .Marland Kitchen... Take 1 tablet by mouth once a day    Metformin Hcl 1000 Mg Tabs (Metformin hcl) .Marland Kitchen... Take 1 tablet by mouth two times a day  Orders: [facility]- Est  Level 4 ([address])  Labs Reviewed: Creat: 0.56 (01/29/2010)   Microalbumin: NEGATVIE (07/13/2007) Reviewed HgBA1c results: 8.4 (02/06/2010)  7.7 (11/07/2009)  Problem # 4:  ANXIETY (ICD-300.00) refill klonopin again.  will see again in 1 month.  Her updated medication list for this problem includes:    Celexa 40 Mg Tabs (Citalopram hydrobromide) .Marland Kitchen... Take one tab by mouth daily    Klonopin 0.5 Mg Tabs (Clonazepam) .Marland Kitchen... 1 tab by mouthtid as needed for anxiety  Orders: [facility]- Est  Level 4 ([address])  Complete Medication List: 1)  Bayer Childrens Aspirin 81 Mg Chew (Aspirin) .... Take 1 tablet by mouth once a day 2)  Crestor 10 Mg Tabs (Rosuvastatin calcium) .... Take 1 tablet by mouth at bedtime 3)  Metformin Hcl 1000 Mg Tabs (Metformin hcl) .... Take 1 tablet by mouth two times a day 4)  Nasacort Aq 55 Mcg/act Aers (Triamcinolone acetonide(nasal)) .... Inhale 2 sprays in each nostril as directed once a day 5)  Celexa 40 Mg Tabs (Citalopram hydrobromide) .... Take one tab by mouth daily 6)  Clarinex-d 24 Hour 5-240 Mg Xr24h-tab (Desloratadine-pseudoephedrine) .... One by mouth daily for allergies 7)  Protonix 40 Mg Tbec (Pantoprazole sodium) .... One by mouth daily 8)  Ventolin Hfa 108 (90 Base) Mcg/act Aers (Albuterol sulfate) .... 9 gm mdi for $9 2 puffs qid as needed for wheeze 9)  Percocet 10-325 Mg Tabs (Oxycodone-acetaminophen) .Marland Kitchen.. 1 by mouth q6hr as needed severe pain. 10)  Nitrostat 0.4 Mg Subl (Nitroglycerin) .... One by mouth as needed for chest pain; may repeat 2 more times every 5 minutes; if chest pain does not resolve after three doses, call 911 11)  Methotrexate 2.5 Mg Tabs (Methotrexate sodium) .... 6 tabs by mouth every friday.  disp 1 mo supply 12)  Folic Acid 1 Mg Tabs (Folic acid) .Marland Kitchen.. 1 by mouth once daily every day except friday. 13)  Klonopin 0.5 Mg Tabs (Clonazepam) .Marland Kitchen.. 1 tab by mouthtid as needed for anxiety 14)  Zyrtec Allergy 10 Mg Caps (Cetirizine hcl) .Marland Kitchen.. 1 tab at bedtime 15)  Fluconazole 100 Mg Tabs (Fluconazole) .... Take 1 tab by mouth  Patient Instructions: 1)  It is good to see you 2)  I want you to continue all your meds 3)  I need to see you again in 1 month.   Prescriptions:  FLUCONAZOLE 100 MG TABS (FLUCONAZOLE) take 1 tab by mouth  #1 x 0   Entered and Authorized by:   Antoine Primas DO   Signed by:   Antoine Primas DO on 03/27/2010   Method used:   Electronically to        [facility]  [name] [phone]* (retail)       [address]       [place], [place]  [id number]       Ph: [phone] or [phone]       Fax: [phone]   RxID:   [id number] KLONOPIN 0.5 MG TABS (CLONAZEPAM) 1 tab by mouthtid as needed for anxiety  #90 x 0   Entered and Authorized by:   Antoine Primas DO   Signed by:   Antoine Primas DO on 03/27/2010   Method used:   Print then Give to Patient   RxID:   [id number] FOLIC ACID 1 MG TABS (FOLIC ACID) 1 by mouth once daily every day except friday.  #90 x 3   Entered and Authorized by:   Antoine Primas DO   Signed by:   Antoine Primas DO on 03/27/2010   Method used:   [facility]        [facility]  [name] [phone]* (retail)       [address]       [place], [place]  [address]       Ph: [phone] or [phone]       Fax: [phone]   RxID:   [id number] PERCOCET 10-325 MG TABS (OXYCODONE-ACETAMINOPHEN) 1 by mouth q6hr as needed severe pain.  #60 x 0   Entered and Authorized by:   Antoine Primas DO   Signed by:   Antoine Primas DO on 03/27/2010   Method used:   Print then Give to Patient   RxID:   [id number]

## 2010-07-31 NOTE — Assessment & Plan Note (Signed)
Summary: f/u eo   Vital Signs:  Patient profile:   60 year old female Height:      63 inches Weight:      168.5 pounds BMI:     29.96 Temp:     98.1 degrees F oral Pulse rate:   84 / minute BP sitting:   132 / 83  (left arm) Cuff size:   regular  Vitals Entered By: Garen Grams LPN (August 31, 930 11:08 AM) CC: f/u shoulder pain Is Patient Diabetic? No Pain Assessment Patient in pain? yes     Location: L shoulder   Primary Care Provider:  Myrtie Soman  MD  CC:  f/u shoulder pain.  History of Present Illness: 1. L shoulder pain Seen last week after admission to  [facility] 2/14 for L shoulder pain concerning for cardiac etiology. ruled out with CEs. Diagnosed with partial rotator cuff tear in left shoulder per MRI done in the hospital. MRI also showed evidence of bursitis and shoulder joint effusion. Also showed increased fluid in the labrum which may represent a tear.  Here for steroid injection today. Pt says pain in shoulder is about the same -- taking ibuprofen 800 mg 2-3 times daily with percocet and flexeril. Has 4 percocet left. Tried to do ROM exercises for a few days but was limited by pain.  2. R hand and wrist pain compltetely resolved -- rheum labs, CBC, sed rate all normal at last visit.  Has a sister with severe RA.     Habits & Providers  Alcohol-Tobacco-Diet     Tobacco Status: never  Current Medications (verified): 1)  Bayer Childrens Aspirin 81 Mg Chew (Aspirin) .... Take 1 Tablet By Mouth Once A Day 2)  Crestor 10 Mg Tabs (Rosuvastatin Calcium) .... Take 1 Tablet By Mouth At Bedtime 3)  Metformin Hcl 1000 Mg Tabs (Metformin Hcl) .... Take 1 Tablet By Mouth Two Times A Day 4)  Nasacort Aq 55 Mcg/act Aers (Triamcinolone Acetonide(Nasal)) .... Inhale 2 Sprays in Each Nostril As Directed Once A Day 5)  Celexa 40 Mg  Tabs (Citalopram Hydrobromide) .... Take One Tab By Mouth Daily 6)  Allegra-D 12 Hour 60-120 Mg Xr12h-Tab (Fexofenadine-Pseudoephedrine) .... One  By Mouth Two Times A Day 7)  Klonopin 0.5 Mg  Tabs (Clonazepam) .... Take One Tablet Two Times A Day As Needed For Anxiety 8)  Protonix 40 Mg Tbec (Pantoprazole Sodium) .... One By Mouth Daily 9)  Ventolin Hfa 108 (90 Base) Mcg/act Aers (Albuterol Sulfate) .... 9 Gm Mdi For $9 2 Puffs Qid As Needed For Wheeze 10)  Tramadol Hcl 50 Mg Tabs (Tramadol Hcl) .Marland Kitchen.. 1-2 Tabs By Mouth Every 6 Hours As Needed  Allergies (verified): 1)  ! Sulfa  Review of Systems       some R shoulder pain and bilateral knee pain. No chest pain or SOB. Increased stress with 60 yo dtr due to give birth within the next week or two.   Physical Exam  Additional Exam:  General:  Vital signs reviewed -- overweight but otherwise normal  MSK:  L shoulder -- + hawkins and empty can. Has painful arc but no drop arm sign. Limited forward and lateral abduction to 110 degrees  Extremities:  no cyanosis, clubbing, or edema Neurologic:  alert and oriented. speech normal.  Patient given informed consent for injection. Appropriate verbal time out taken. Area cleaned and prepped in usual sterile fashion. 1 cc kennalog 40 plus 4 cc 1% lidocaine without epinephrine was  injected into the L shoulder. Patient tolerated procedure well with no complications   Impression & Recommendations:  Problem # 1:  ROTATOR CUFF TEAR (ICD-727.61) Assessment Unchanged  Has documented rotator cuff tear injected today. will have patient back in 2 weeks to recheck progress. wean off percocet and transition to tramadol. continue flexeril, exercises to help with ROM. Consider referral back to sports medicine or ortho if deteriorates.   Orders: [facility]- Est Level  3 (03474)  Problem # 2:  WRIST PAIN, RIGHT (ICD-719.43) Assessment: Improved  resolved. Pt continues to have numerous and varied MSK complaints. Normal rheum w/u after last visit. The challenge may be to avoid overtreating every complaint this pt has. Less is likely more.   Orders: [facility]- Est  Level  3 ([address])  Complete Medication List: 1)  Bayer Childrens Aspirin 81 Mg Chew (Aspirin) .... Take 1 tablet by mouth once a day 2)  Crestor 10 Mg Tabs (Rosuvastatin calcium) .... Take 1 tablet by mouth at bedtime 3)  Metformin Hcl 1000 Mg Tabs (Metformin hcl) .... Take 1 tablet by mouth two times a day 4)  Nasacort Aq 55 Mcg/act Aers (Triamcinolone acetonide(nasal)) .... Inhale 2 sprays in each nostril as directed once a day 5)  Celexa 40 Mg Tabs (Citalopram hydrobromide) .... Take one tab by mouth daily 6)  Allegra-d 12 Hour 60-120 Mg Xr12h-tab (Fexofenadine-pseudoephedrine) .... One by mouth two times a day 7)  Klonopin 0.5 Mg Tabs (Clonazepam) .... Take one tablet two times a day as needed for anxiety 8)  Protonix 40 Mg Tbec (Pantoprazole sodium) .... One by mouth daily 9)  Ventolin Hfa 108 (90 Base) Mcg/act Aers (Albuterol sulfate) .... 9 gm mdi for $9 2 puffs qid as needed for wheeze 10)  Tramadol Hcl 50 Mg Tabs (Tramadol hcl) .Marland Kitchen.. 1-2 tabs by mouth every 6 hours as needed  Patient Instructions: 1)  change from the percocet to the tramadol -- remember that percocet is not a good long-term medicine 2)  follow-up in 2 weeks to see how your shoulder is doing and we'll take a look at the other shoulder 3)  continue to try the exercise -- your pain shouldn't get more than a 3 out of 10. We don't want you to lose range of motion.  Prescriptions: TRAMADOL HCL 50 MG TABS (TRAMADOL HCL) 1-2 tabs by mouth every 6 hours as needed  #60 x 1   Entered and Authorized by:   Myrtie Soman  MD   Signed by:   Myrtie Soman  MD on 08/30/2009   Method used:   [facility]        [facility] [address] [phone]* (retail)       [address]       [place], [place]  [id number]       Ph: [phone] or [phone]       Fax: [phone]   RxID:   [id number]    Prevention & Chronic Care Immunizations   Influenza vaccine: Fluvax Non-MCR  (05/02/2009)   Influenza  vaccine due: 06/11/2008    Tetanus booster: 01/03/2005: Done.   Tetanus booster due: 01/04/2015    Pneumococcal vaccine: Not documented  Colorectal Screening   Hemoccult: Not documented   Hemoccult due: Not Indicated    Colonoscopy: normal  (05/07/2006)   Colonoscopy due: 05/08/2011  Other Screening   Pap smear: Not documented   Pap smear due: Not Indicated    Mammogram: ASSESSMENT:  Negative - BI-RADS 1^MS DIGITAL SCREENING  (05/29/2009)   Mammogram due: 05/29/2010   Smoking status: never  (08/30/2009)  Diabetes Mellitus   HgbA1C: 7.0  (07/05/2009)   Hemoglobin A1C due: 09/10/2007    Eye exam: Not documented    Foot exam: yes  (07/13/2007)   High risk foot: Not documented   Foot care education: Not documented   Foot exam due: 07/12/2008    Urine microalbumin/creatinine ratio: Not documented   Urine microalbumin/cr due: 07/12/2008    Diabetes flowsheet reviewed?: Yes   Progress toward A1C goal: Unchanged  Lipids   Total Cholesterol: 119  (08/22/2008)   LDL: 58  (08/15/2009)   LDL Direct: 65  (06/12/2007)   HDL: 48  (08/22/2008)   Triglycerides: 126  (08/22/2008)    SGOT (AST): 11  (08/22/2008)   SGPT (ALT): 14  (08/22/2008)   Alkaline phosphatase: 62  (08/22/2008)   Total bilirubin: 0.4  (08/22/2008)    Lipid flowsheet reviewed?: Yes   Progress toward LDL goal: Unchanged  Self-Management Support :   Personal Goals (by the next clinic visit) :     Personal A1C goal: 7  (03/09/2009)     Personal blood pressure goal: 130/80  (03/09/2009)     Personal LDL goal: 70  (03/09/2009)    Diabetes self-management support: Written self-care plan, Education handout  (03/09/2009)    Diabetes self-management support not done because: Not indicated  (08/30/2009)    Lipid self-management support: Written self-care plan  (03/09/2009)     Lipid self-management support not done because: Not indicated  (08/30/2009)   Appended Document: Orders Update    Clinical  Lists Changes  Orders: Added new Test order of Injection, large joint- [facility] (44034) - Signed

## 2010-07-31 NOTE — Assessment & Plan Note (Signed)
Summary: f/u eo   Vital Signs:  Patient profile:   60 year old female Height:      63 inches Weight:      168.2 pounds BMI:     29.90 Temp:     98.2 degrees F oral Pulse rate:   78 / minute BP sitting:   131 / 86  (left arm) Cuff size:   regular  Vitals Entered By: Garen Grams LPN (September 15, 2009 8:46 AM) CC: f/u shoulder and wrist pain Is Patient Diabetic? Yes Did you bring your meter with you today? No   Primary Care Provider:  Myrtie Soman  MD  CC:  f/u shoulder and wrist pain.  History of Present Illness: 1. L shoulder pain with chronic rotator cuff tear - has had pain for years. s/p injection 08/30/09. Feels like it helped. Still with some pain but pleased with the result.  Cannot afford PT  2. sinus problems / allergies Has head congestion and runny nose, having to clear throat constantly. Using nasal steroid. Worse over the pat few weeks.   3. anxiety Had another grandchild born 3/7. States that her anxiety is "better" now that it has been. States that celexa helps her with worry and stress.   4. Diabetes taking medications: yes problems with medications?: no, on metformin blood sugar testing frequency: doesn't check regularly hypoglycemic events?:no  subjective: states this is doing well.   ROS chest pain: no   shortness of breath: no   polyuria: no    polydipsia: no    problems with feet: none except thick toenails and dry skin.   Habits & Providers  Alcohol-Tobacco-Diet     Tobacco Status: never  Current Medications (verified): 1)  Bayer Childrens Aspirin 81 Mg Chew (Aspirin) .... Take 1 Tablet By Mouth Once A Day 2)  Crestor 10 Mg Tabs (Rosuvastatin Calcium) .... Take 1 Tablet By Mouth At Bedtime 3)  Metformin Hcl 1000 Mg Tabs (Metformin Hcl) .... Take 1 Tablet By Mouth Two Times A Day 4)  Nasacort Aq 55 Mcg/act Aers (Triamcinolone Acetonide(Nasal)) .... Inhale 2 Sprays in Each Nostril As Directed Once A Day 5)  Celexa 40 Mg  Tabs (Citalopram  Hydrobromide) .... Take One Tab By Mouth Daily 6)  Allegra-D 12 Hour 60-120 Mg Xr12h-Tab (Fexofenadine-Pseudoephedrine) .... One By Mouth Two Times A Day 7)  Klonopin 0.5 Mg  Tabs (Clonazepam) .... Take One Tablet Two Times A Day As Needed For Anxiety 8)  Protonix 40 Mg Tbec (Pantoprazole Sodium) .... One By Mouth Daily 9)  Ventolin Hfa 108 (90 Base) Mcg/act Aers (Albuterol Sulfate) .... 9 Gm Mdi For $9 2 Puffs Qid As Needed For Wheeze 10)  Tramadol Hcl 50 Mg Tabs (Tramadol Hcl) .Marland Kitchen.. 1-2 Tabs By Mouth Every 6 Hours As Needed  Allergies (verified): 1)  ! 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 On the MAP program. Lives in trailer. Uses wood for heat. Stressful family situation. 59 yo dtr just had a baby 3/2. Lives with Morrie Sheldon (oldest grandchild) and her husband.   Physical Exam  General:  vital  signs reviewed and normal Alert, appropriate; well-dressed and well-nourished  Nose:  clear without rhinorrhea; no deformity; mucosa pink Mouth:  oropharynx pink, moist; no erythema or exudate  Lungs:  work of breathing unlabored, clear to auscultation bilaterally; no wheezes, rales, or ronchi; good air movement throughout  Heart:  regular rate and rhythm, no murmurs; normal s1/s2  Skin:  somewhat dry on lower extremities, intact with good turgor.  Psych:  alert and oriented. full affect, normally interactive. Good eye contact.   Diabetes Management Exam:    Foot Exam (with socks and/or shoes not present):       Sensory-Pinprick/Light touch:          Left medial foot (L-4): normal          Left dorsal foot (L-5):  normal          Left lateral foot (S-1): normal          Right medial foot (L-4): normal          Right dorsal foot (L-5): normal          Right lateral foot (S-1): normal       Sensory-Monofilament:          Left foot: normal          Right foot: normal       Inspection:          Left foot: normal          Right foot: normal       Nails:          Left foot: thickened          Right foot: thickened   Impression & Recommendations:  Problem # 1:  ROTATOR CUFF TEAR (ICD-727.61) Assessment Improved  improved after injection. PT would be ideal. Would limit injections to 4 total in case surgery every becomes an option. Follow for now.   Orders: [facility]- Est  Level 4 (99214)  Problem # 2:  SINUSITIS, CHRONIC (ICD-473.9) Assessment: Deteriorated  likely worsened with seasonal pollen. Pt reports that allegra will no longer be prescription. Advised loratidine if she can afford it. Pt thinks she can.  Her updated medication list for this problem includes:    Nasacort Aq 55 Mcg/act Aers (Triamcinolone acetonide(nasal)) ..... Inhale 2 sprays in each nostril as directed once a day    Allegra-d 12 Hour 60-120 Mg Xr12h-tab (Fexofenadine-pseudoephedrine) ..... One by mouth two times a day  Orders: [facility]- Est  Level 4 (52841)  Problem # 3:  ANXIETY (ICD-300.00) Assessment: Improved  refill celexa today. Doing well. Follow.  Her updated medication list for this problem includes:    Celexa 40 Mg Tabs (Citalopram hydrobromide) .Marland Kitchen... Take one tab by mouth daily    Klonopin 0.5 Mg Tabs (Clonazepam) .Marland Kitchen... Take one tablet two times a day as needed for anxiety  Orders: [facility]- Est  Level 4 (32440)  Problem # 4:  DIABETES MELLITUS II, UNCOMPLICATED (ICD-250.00) Assessment: Unchanged  a1c 7.0 in Jan. Lipids good. BP stable. follow. On metformin two times a day .  Her updated medication list for this problem includes:    Bayer Childrens Aspirin 81 Mg Chew (Aspirin) .Marland Kitchen... Take 1 tablet by mouth once  a day    Metformin Hcl 1000 Mg Tabs (Metformin hcl) .Marland Kitchen... Take 1 tablet by mouth two times a day  Labs Reviewed: Creat: 0.56 (08/15/2009)   Microalbumin: NEGATVIE (07/13/2007) Reviewed HgBA1c results: 7.0 (07/05/2009)  7.3 (03/09/2009)  Orders: [facility]- Est  Level  4 [id number])  Complete Medication List: 1)  Bayer Childrens Aspirin 81 Mg Chew (Aspirin) .... Take 1 tablet by mouth once a day 2)  Crestor 10 Mg Tabs (Rosuvastatin calcium) .... Take 1 tablet by mouth at bedtime 3)  Metformin Hcl 1000 Mg Tabs (Metformin hcl) .... Take 1 tablet by mouth two times a day 4)  Nasacort Aq 55 Mcg/act Aers (Triamcinolone acetonide(nasal)) .... Inhale 2 sprays in each nostril as directed once a day 5)  Celexa 40 Mg Tabs (Citalopram hydrobromide) .... Take one tab by mouth daily 6)  Allegra-d 12 Hour 60-120 Mg Xr12h-tab (Fexofenadine-pseudoephedrine) .... One by mouth two times a day 7)  Klonopin 0.5 Mg Tabs (Clonazepam) .... Take one tablet two times a day as needed for anxiety 8)  Protonix 40 Mg Tbec (Pantoprazole sodium) .... One by mouth daily 9)  Ventolin Hfa 108 (90 Base) Mcg/act Aers (Albuterol sulfate) .... 9 gm mdi for $9 2 puffs qid as needed for wheeze 10)  Tramadol Hcl 50 Mg Tabs (Tramadol hcl) .Marland Kitchen.. 1-2 tabs by mouth every 6 hours as needed  Patient Instructions: 1)  I'll send your celexa in to the pharmacy. 2)  try over the counter loratidine (generic name) 10 mg daily for your allergies.  3)  I think all in all, things are going okay. 4)  follow-up with me in 2 months. Prescriptions: CELEXA 40 MG  TABS (CITALOPRAM HYDROBROMIDE) take one tab by mouth daily  #30 x 3   Entered and Authorized by:   Myrtie Soman  MD   Signed by:   Myrtie Soman  MD on 09/15/2009   Method used:   Faxed to ...       [facility] (retail)       [address] [place], [place]  [address]       Ph: [phone]       Fax: [phone]   RxID:   [id number]    Prevention &  Chronic Care Immunizations   Influenza vaccine: Fluvax Non-MCR  (05/02/2009)   Influenza vaccine due: 06/11/2008    Tetanus booster: 01/03/2005: Done.   Tetanus booster due: 01/04/2015    Pneumococcal vaccine: Not documented  Colorectal Screening   Hemoccult: Not documented   Hemoccult due: Not Indicated    Colonoscopy: normal  (05/07/2006)   Colonoscopy due: 05/08/2011  Other Screening   Pap smear: Not documented   Pap smear due: Not Indicated    Mammogram: ASSESSMENT: Negative - BI-RADS 1^MS DIGITAL SCREENING  (05/29/2009)   Mammogram due: 05/29/2010   Smoking status: never  (09/15/2009)  Diabetes Mellitus   HgbA1C: 7.0  (07/05/2009)   Hemoglobin A1C due: 09/10/2007    Eye exam: Not documented    Foot exam: yes  (09/15/2009)   High risk foot: Not documented   Foot care education: Not documented   Foot exam due: 07/12/2008    Urine microalbumin/creatinine ratio: Not documented   Urine microalbumin/cr due: 07/12/2008    Diabetes flowsheet reviewed?: Yes   Progress toward A1C goal: Unchanged  Lipids   Total Cholesterol: 119  (08/22/2008)   LDL: 58  (08/15/2009)   LDL Direct: 65  (06/12/2007)   HDL: 48  (08/22/2008)   Triglycerides: 126  (08/22/2008)    SGOT (AST): 11  (08/22/2008)   SGPT (ALT): 14  (08/22/2008)   Alkaline phosphatase: 62  (08/22/2008)   Total bilirubin: 0.4  (08/22/2008)    Lipid flowsheet reviewed?: Yes   Progress  toward LDL goal: At goal  Self-Management Support :   Personal Goals (by the next clinic visit) :     Personal A1C goal: 7  (03/09/2009)     Personal blood pressure goal: 130/80  (03/09/2009)     Personal LDL goal: 70  (03/09/2009)    Diabetes self-management support: Written self-care plan, Referred for DM self-management training  (09/15/2009)   Diabetes care plan printed    Diabetes self-management support not done because: Not indicated  (08/30/2009)    Lipid self-management support: Written self-care plan   (09/15/2009)   Lipid self-care plan printed.    Lipid self-management support not done because: Not indicated  (08/30/2009)   Nursing Instructions: Refer for diabetes self-management training (see order)    Diabetes Self Management Training Referral Patient Name: Michelle [name] Date Of Birth: 05-23-1951 MRN: [id number] Current Diagnosis:  ROTATOR CUFF TEAR (ICD-727.61) WRIST PAIN, RIGHT (ICD-719.43) SHOULDER PAIN, LEFT (ICD-719.41) CHEST PAIN (ICD-786.50) MOURNING (ICD-309.0) COUGH (ICD-786.2) CARPAL TUNNEL SYNDROME, LEFT (ICD-354.0) WRIST PAIN (ICD-719.43) NEED PROPHYLACTIC VACCINATION&INOCULATION FLU (ICD-V04.81) MEDIAL MENISCUS TEAR, LEFT (ICD-836.0) KNEE PAIN, CHRONIC (ICD-719.46) GERD (ICD-530.81) PRURITUS (ICD-698.9) URINARY FREQUENCY (ICD-788.41) NEED PROPHYLACTIC VACCINATION&INOCULATION FLU (ICD-V04.81) SINUSITIS, CHRONIC (ICD-473.9) ELEVATED BLOOD PRESSURE WITHOUT DIAGNOSIS OF HYPERTENSION (ICD-796.2) ASTHMA, MILD, INTERMITTENT (ICD-493.90) SPASM, MUSCLE (ICD-728.85) RHINITIS, ALLERGIC (ICD-477.9) OBESITY, NOS (ICD-278.00) HYPERLIPIDEMIA (ICD-272.4) DIABETES MELLITUS II, UNCOMPLICATED (ICD-250.00) ANXIETY (ICD-300.00)

## 2010-07-31 NOTE — Letter (Signed)
Summary: Handout Printed  Printed Handout:  - Strep Throat, Adult

## 2010-07-31 NOTE — Progress Notes (Signed)
Summary: referral  Phone Note Call from Patient Call back at Home Phone [phone]   Caller: Patient Summary of Call: went to ED and needs referral to Rheum. Initial call taken by: De Nurse,  Nov 20, 2009 8:42 AM  Follow-up for Phone Call        she will see pcp at 9:45 today Follow-up by: [name] RN,  Nov 20, 2009 8:44 AM

## 2010-07-31 NOTE — Progress Notes (Signed)
Summary: triage  Phone Note Call from Patient Call back at [phone]   Caller: Patient Summary of Call: Pt's knee is hurting really bad and her medication is not touching it. Initial call taken by: Clydell Hakim,  October 04, 2009 11:21 AM  Follow-up for Phone Call        states tramadol does not help at all. hard to rise from toilet or get in car. unable to come in until friday due to responsibilities with children.  asked for something for the intense pain that started this am. [employer] road.   Follow-up by: [name] RN,  October 04, 2009 11:26 AM  Additional Follow-up for Phone Call Additional follow up Details #1::        will call in one time rx for vicodin #20. please call pt to advise. thanks.  Additional Follow-up by: Myrtie Soman  MD,  October 04, 2009 12:15 PM    New/Updated Medications: VICODIN 5-500 MG TABS (HYDROCODONE-ACETAMINOPHEN) 1-2 tabs by mouth every 6 hours as needed for knee pain Prescriptions: VICODIN 5-500 MG TABS (HYDROCODONE-ACETAMINOPHEN) 1-2 tabs by mouth every 6 hours as needed for knee pain  #20 x 0   Entered and Authorized by:   Myrtie Soman  MD   Signed by:   Myrtie Soman  MD on 10/04/2009   Method used:   Telephoned to ...       [facility] [address] [phone]* (retail)       [address]       [place], [place]  [id number]       Ph: [phone] or [phone]       Fax: [phone]   RxID:   [id number]   Appended Document: triage told her that rx was sent. make appt before she runs out as he will not give more without an appt

## 2010-07-31 NOTE — Progress Notes (Signed)
Summary: Rx Prob  Phone Note Call from Patient Call back at [phone]   Caller: Patient Summary of Call: Pt says the rx that Dr. Rexene Alberts sent in today for her did not get to the [facility]. Initial call taken by: Clydell Hakim,  December 08, 2009 4:15 PM  Follow-up for Phone Call        attempted calling [facility] and had to leave a message . will continue to try. Follow-up by: Theresia Lo RN,  December 08, 2009 4:22 PM  Additional Follow-up for Phone Call Additional follow up Details #1::        rx called to [facility] dept pharmacy for Nitrostat. they did not recieve rx that was electronically faxed. patient notifed. Additional Follow-up by: Theresia Lo RN,  December 08, 2009 4:29 PM

## 2010-07-31 NOTE — Assessment & Plan Note (Signed)
Summary: infected toe,df   Vital Signs:  Patient profile:   60 year old female Height:      63 inches Weight:      170.9 pounds BMI:     30.38 Temp:     98.1 degrees F oral Pulse rate:   86 / minute BP sitting:   149 / 84  (left arm) Cuff size:   regular  Vitals Entered By: Garen Grams LPN (May 22, 2010 2:27 PM) CC: infected toe x 1 week Is Patient Diabetic? Yes Did you bring your meter with you today? No Pain Assessment Patient in pain? yes     Location: left middle toe   Primary Care Provider:  Antoine Primas DO  CC:  infected toe x 1 week.  History of Present Illness: Ms Bunker notes that her left 3rd toe has become red and painful over the past week. She notes that some puss drained from the area surrounding the nail yesterday. She has been putting tripple antibiotic ointment on the area. She denies any fever, chills or sweats. She feels well otherwise. She cannot rememeber any trauma to the nail.  She is allergic to sulfa antibiotics. She is also diabtic and on methotrexiate for RH.  Does also note that a child in her house has had a MRSA skin infection in the past.   Habits & Providers  Alcohol-Tobacco-Diet     Tobacco Status: quit     Year Quit: 16 years  Current Problems (verified): 1)  Paronychia, Toe  (ICD-681.11) 2)  Rheumatoid Arthritis  (ICD-714.0) 3)  Chest Pain, Atypical  (ICD-786.59) 4)  Rotator Cuff Tear  (ICD-727.61) 5)  Carpal Tunnel Syndrome, Left  (ICD-354.0) 6)  Wrist Pain  (ICD-719.43) 7)  Medial Meniscus Tear, Left  (ICD-836.0) 8)  Gerd  (ICD-530.81) 9)  Sinusitis, Chronic  (ICD-473.9) 10)  Asthma, Intermittent  (ICD-493.90) 11)  Rhinitis, Allergic  (ICD-477.9) 12)  Obesity, Nos  (ICD-278.00) 13)  Hyperlipidemia  (ICD-272.4) 14)  Diabetes Mellitus II, Uncomplicated  (ICD-250.00) 15)  Anxiety  (ICD-300.00)  Current Medications (verified): 1)  Bayer Childrens Aspirin 81 Mg Chew (Aspirin) .... Take 1 Tablet By Mouth Once A Day 2)   Crestor 10 Mg Tabs (Rosuvastatin Calcium) .... Take 1 Tablet By Mouth At Bedtime 3)  Metformin Hcl 1000 Mg Tabs (Metformin Hcl) .... Take 1 Tablet By Mouth Two Times A Day 4)  Nasacort Aq 55 Mcg/act Aers (Triamcinolone Acetonide(Nasal)) .... Inhale 2 Sprays in Each Nostril As Directed Once A Day 5)  Celexa 40 Mg  Tabs (Citalopram Hydrobromide) .... Take One Tab By Mouth Daily 6)  Clarinex-D 24 Hour 5-240 Mg Xr24h-Tab (Desloratadine-Pseudoephedrine) .... One By Mouth Daily For Allergies 7)  Protonix 40 Mg Tbec (Pantoprazole Sodium) .... One By Mouth Daily 8)  Ventolin Hfa 108 (90 Base) Mcg/act Aers (Albuterol Sulfate) .... 9 Gm Mdi For $9 2 Puffs Qid As Needed For Wheeze 9)  Percocet 10-325 Mg Tabs (Oxycodone-Acetaminophen) .Marland Kitchen.. 1 By Mouth Q6hr As Needed Severe Pain. 10)  Nitrostat 0.4 Mg Subl (Nitroglycerin) .... One By Mouth As Needed For Chest Pain; May Repeat 2 More Times Every 5 Minutes; If Chest Pain Does Not Resolve After Three Doses, Call 911 11)  Methotrexate 2.5 Mg Tabs (Methotrexate Sodium) .... 8 Tabs By Mouth Every Friday.  Disp 1 Mo Supply 12)  Folic Acid 1 Mg Tabs (Folic Acid) .Marland Kitchen.. 1 By Mouth Once Daily Every Day Except Friday. 13)  Klonopin 0.5 Mg Tabs (Clonazepam) .Marland Kitchen.. 1 Tab By  Mouthtid As Needed For Anxiety 14)  Zyrtec Allergy 10 Mg Caps (Cetirizine Hcl) .Marland Kitchen.. 1 Tab At Bedtime 15)  Prednisone 20 Mg Tabs (Prednisone) .... Take 2 Tabs By Mouth Daily For The Next 5 Days 16)  Doxycycline Hyclate 100 Mg Caps (Doxycycline Hyclate) .Marland Kitchen.. 1 By Mouth Bid  Allergies (verified): 1)  ! Sulfa  Past History:  Past Medical History: Last updated: 01/08/2010 RA- CCP positive, will be followed at [facility] HLD DM Anxiety Asthma Allergic Rhinitis 2D ECHO 10/99 = EF 55% Negative r/o MI w/u 5/06 L rotator cuff tear (old) - diagnosed by MRI 2/11 - steroid injection 08/30/09 negative r/u MI w/u 2/11  Past Surgical History: Last updated: 08/23/2009 Appendectomy - 07/05/2004 Bone spur removal left  foot - 07/05/2004 Cholecystectomy - 07/05/2004 Colonoscopy-polypectomy - 07/01/1994 Hysterectomy-complete - 07/05/2004 Right carpal tunnel release - 07/05/2004 Right knee surg x2 - 07/05/2004 UGI Series w/ SB followthru-mild GERD - 01/30/1999  Social History: Last updated: 11/20/2009 Married to the same man since the age of 67.; 12 pack year h/o smoking-quit 11 years ago.; No EtOH.; 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 On the MAP program. Lives in trailer. Uses wood for heat. Stressful family situation. 61 yo dtr just had a baby 08/30/09. Lives with Morrie Sheldon (oldest grandchild) and her husband. Morrie Sheldon has intellectual disability  Review of Systems  The patient denies anorexia, fever, weight loss, and enlarged lymph nodes.    Physical Exam  General:  VS noted.  Well NAD Lungs:  Normal respiratory effort, chest expands symmetrically. Lungs are clear to auscultation, no crackles or wheezes. Heart:  Normal rate and regular rhythm. S1 and S2 normal without gallop, murmur, click, rub or other extra sounds. Extremities:  Left 3rd toe is erythemetus and mildly tender. Area of crusting on medial nail fold. No pockets of fluctuant or drainage noted. No lacerations or puncture wounds noted.   Feet have thickened nails BL but otherwise are in good shape with normal sensation.   Impression & Recommendations:  Problem # 1:  PARONYCHIA, TOE (ICD-681.11) Assessment New  Does not appear to be due to trauma or ulcer.  No absess noted.  Plan to treat with Doxy as is sulfa allergic for 7 day course. If not better may consider diflucan for fungal coverage or other ABX.  Will follow up with Dr. Katrinka Blazing as needed.  Discussed red flags and pt voices understanding.   Her updated medication list for this problem includes:    Doxycycline Hyclate 100 Mg Caps (Doxycycline hyclate) .Marland Kitchen... 1 by mouth bid  Orders: [facility]- Est Level  3 ([address]63)  Complete Medication List: 1)  Bayer Childrens Aspirin 81  Mg Chew (Aspirin) .... Take 1 tablet by mouth once a day 2)  Crestor 10 Mg Tabs (Rosuvastatin calcium) .... Take 1 tablet by mouth at bedtime 3)  Metformin Hcl 1000 Mg Tabs (Metformin hcl) .... Take 1 tablet by mouth two times a day 4)  Nasacort Aq 55 Mcg/act Aers (Triamcinolone acetonide(nasal)) .... Inhale 2 sprays in each nostril as directed once a day 5)  Celexa 40 Mg Tabs (Citalopram hydrobromide) .... Take one tab by mouth daily 6)  Clarinex-d 24 Hour 5-240 Mg Xr24h-tab (Desloratadine-pseudoephedrine) .... One by mouth daily for allergies 7)  Protonix 40 Mg Tbec (Pantoprazole sodium) .... One by mouth daily 8)  Ventolin Hfa 108 (90 Base) Mcg/act Aers (Albuterol sulfate) .... 9 gm mdi for $9 2 puffs qid as needed for wheeze 9)  Percocet  10-325 Mg Tabs (Oxycodone-acetaminophen) .Marland Kitchen.. 1 by mouth q6hr as needed severe pain. 10)  Nitrostat 0.4 Mg Subl (Nitroglycerin) .... One by mouth as needed for chest pain; may repeat 2 more times every 5 minutes; if chest pain does not resolve after three doses, call 911 11)  Methotrexate 2.5 Mg Tabs (Methotrexate sodium) .... 8 tabs by mouth every friday.  disp 1 mo supply 12)  Folic Acid 1 Mg Tabs (Folic acid) .Marland Kitchen.. 1 by mouth once daily every day except friday. 13)  Klonopin 0.5 Mg Tabs (Clonazepam) .Marland Kitchen.. 1 tab by mouthtid as needed for anxiety 14)  Zyrtec Allergy 10 Mg Caps (Cetirizine hcl) .Marland Kitchen.. 1 tab at bedtime 15)  Prednisone 20 Mg Tabs (Prednisone) .... Take 2 tabs by mouth daily for the next 5 days 16)  Doxycycline Hyclate 100 Mg Caps (Doxycycline hyclate) .Marland Kitchen.. 1 by mouth bid  Patient Instructions: 1)  Thank you for seeing me today. 2)  If you have chest pain, difficulty breathing, fevers over 102 that does not get better with tylenol please call us or see a doctor.  3)  If you dont start feeling better in a few days let us know.  4)  If it gets worse let us know.  Prescriptions: DOXYCYCLINE HYCLATE 100 MG CAPS (DOXYCYCLINE HYCLATE) 1 by mouth bid   #14 x 0   Entered and Authorized by:   Clementeen Graham MD   Signed by:   Clementeen Graham MD on 05/22/2010   Method used:   Print then Give to Patient   RxID:   [id number]    Orders Added: 1)  [facility]- Est Level  3 [[id number]]

## 2010-07-31 NOTE — Progress Notes (Signed)
Summary: triage  Phone Note Call from Patient Call back at Home Phone [phone]   Caller: Patient Summary of Call: Pt had a antibotic called in yesterday and had asked for a cheaper one and the one called in was 30 dollars.  Pt uses [facility] on [address]. Initial call taken by: Clydell Hakim,  July 12, 2009 3:11 PM  Follow-up for Phone Call        to pcp to se if there is a less expensive one that can be ordered Follow-up by: [name] RN,  July 12, 2009 3:15 PM  Additional Follow-up for Phone Call Additional follow up Details #1::        will call in amox. please call pt to advise. thanks.  Additional Follow-up by: Myrtie Soman  MD,  July 12, 2009 3:17 PM    Additional Follow-up for Phone Call Additional follow up Details #2::    pt notified Follow-up by: [name] RN,  July 12, 2009 3:27 PM  New/Updated Medications: AMOXICILLIN 500 MG CAPS (AMOXICILLIN) one by mouth two times a day for 7 days Prescriptions: AMOXICILLIN 500 MG CAPS (AMOXICILLIN) one by mouth two times a day for 7 days  #14 x 0   Entered and Authorized by:   Myrtie Soman  MD   Signed by:   Myrtie Soman  MD on 07/12/2009   Method used:   Electronically to        [facility]* (retail)       [address] [place], [place]  [address]       Ph: [phone]       Fax: [phone]   RxID:   [id number]

## 2010-07-31 NOTE — Progress Notes (Signed)
Summary: triage  Phone Note Call from Patient Call back at Home Phone [phone]   Caller: Patient Summary of Call: Pt says that the Amoxicillian was to be called into [facility] on [address].  Went to wrong pharmacy. Initial call taken by: Clydell Hakim,  July 17, 2009 9:29 AM  Follow-up for Phone Call       Follow-up by: [name] RN,  July 17, 2009 9:39 AM    Prescriptions: AMOXICILLIN 500 MG CAPS (AMOXICILLIN) one by mouth two times a day for 7 days  #14 x 0   Entered by:   [name] RN   Authorized by:   Myrtie Soman  MD   Signed by:   [name] RN on 07/17/2009   Method used:   Electronically to        [facility] (retail[facility]       [address]       [place], [place]  [address]       Ph: [phone]       Fax: [phone]   RxID:   [id number]

## 2010-07-31 NOTE — Assessment & Plan Note (Signed)
Summary: R Knee pain. see notes.[place]/everhart   Vital Signs:  Patient profile:   60 year old female Height:      63 inches Weight:      168 pounds BMI:     29.87 Temp:     98.8 degrees F oral Pulse rate:   105 / minute BP sitting:   123 / 82  (right arm) Cuff size:   regular  Vitals Entered By: Tessie Fass CMA (December 14, 2009 10:42 AM) CC: right knee pain x 2 days Is Patient Diabetic? Yes Pain Assessment Patient in pain? yes     Location: right leg Intensity: 10   Primary Care Provider:  Myrtie Soman  MD  CC:  right knee pain x 2 days.  History of Present Illness: knee pain: flared up about 2 days ago.  now to the point she cannot walk on it due to pain.  knee is hot, swollen.  not particualrly red.  has tried her home percocet without relief.  was taken off diclofenac earlier this year because she was getting some chest discomfort with it.  has just gotten over flare of arthritis in her  hands.  still waiting on appt at [facility] rheumatology on 06/04/10.  has been on courses of steroids in the past but she isn't sure if they've helped or not.  Recently she had an elevated CRP but RF still remains negative. she denies any recent fever.  of note her sister has RA and is on prednisone, plaquenil and methotrexate with some good results.  also Ms Mclouth was on MTX remotely for another indication and did well with it at that point in time.   Habits & Providers  Alcohol-Tobacco-Diet     Tobacco Status: quit  Current Medications (verified): 1)  Bayer Childrens Aspirin 81 Mg Chew (Aspirin) .... Take 1 Tablet By Mouth Once A Day 2)  Crestor 10 Mg Tabs (Rosuvastatin Calcium) .... Take 1 Tablet By Mouth At Bedtime 3)  Metformin Hcl 1000 Mg Tabs (Metformin Hcl) .... Take 1 Tablet By Mouth Two Times A Day 4)  Nasacort Aq 55 Mcg/act Aers (Triamcinolone Acetonide(Nasal)) .... Inhale 2 Sprays in Each Nostril As Directed Once A Day 5)  Celexa 40 Mg  Tabs (Citalopram Hydrobromide) .... Take One  Tab By Mouth Daily 6)  Clarinex-D 24 Hour 5-240 Mg Xr24h-Tab (Desloratadine-Pseudoephedrine) .... One By Mouth Daily For Allergies 7)  Protonix 40 Mg Tbec (Pantoprazole Sodium) .... One By Mouth Daily 8)  Ventolin Hfa 108 (90 Base) Mcg/act Aers (Albuterol Sulfate) .... 9 Gm Mdi For $9 2 Puffs Qid As Needed For Wheeze 9)  Percocet 10-325 Mg Tabs (Oxycodone-Acetaminophen) .Marland Kitchen.. 1 By Mouth Q6hr As Needed Severe Pain. 10)  Nitrostat 0.4 Mg Subl (Nitroglycerin) .... One By Mouth As Needed For Chest Pain; May Repeat 2 More Times Every 5 Minutes; If Chest Pain Does Not Resolve After Three Doses, Call 911 11)  Methotrexate 2.5 Mg Tabs (Methotrexate Sodium) .... 3 Tabs By Mouth Every Friday.  Disp 1 Mo Supply 12)  Folic Acid 1 Mg Tabs (Folic Acid) .Marland Kitchen.. 1 By Mouth Once Daily Every Day Except Friday. 13)  Prednisone 10 Mg Tabs (Prednisone) .... 6po Once Daily X10d Then 5 X2d Then 4x2d Then 3x2d Then 2x2d Then 1x2d Then Stop. Disp Qs  Allergies (verified): 1)  ! Sulfa  Past History:  Past Medical History: HLD DM Anxiety Asthma Allergic Rhinitis 2D ECHO 10/99 = EF 55% Negative r/o MI w/u 5/06 L rotator cuff tear (  old) - diagnosed by MRI 2/11 - steroid injection 08/30/09 negative r/u MI w/u 2/11 migratory arthralgias - ? seronegative RA  Family History: Dad died at 68 2/2 CVA, MI; he also had DM,  Mom died at 45 2/2 complications associated w/ PNA, One brother healthy (18),  One brother with inoperable lung CA (49)-died 11/24/22, One sister with DM (58), One sister with RA (56) treated with plaquenil, prednisone, MTX Three kids all healthy  Social History: Smoking Status:  quit  Review of Systems       per HPI  Physical Exam  General:  vitals signs reviewed -- WNL except for mild tachycardia.  appears uncomfortable.  well devoloped.  well hydrated Msk:  right knee warm to touch over joint lines, mildly swollen but without large effusion.  no erythema.  diminished ROM due to pain.  will not bear  weight.  exquisitely tender to palpation along joint lines    Impression & Recommendations:  Problem # 1:  POLYARTHRITIS (ICD-719.49) Assessment Deteriorated i suspect as dr Rexene Alberts has recently that this perhaps represents a seronegative RA and she needs rheumatology appt.  she certainly has an appropriate family history and personal history for this being the case. unforunately her financial situation  makes this rheum appt impossible for a long period of time (though today we will try to get appt moved up to sooner).  waiting this long can be damaging to her joints and also severly limits her adls.  given recent lab work that was good and history of tolerating MTX before will go ahead with DMARD of MTX with folic acid supplementation for treatment of presumed seronegative RA.  check LFTS today to be certain they are still good, also added CCP to see if this is positive (more specific for ra, less sensitive).  CRP was elevated recently.  for acute flare do prednisone burst and increase dose of percocets to 10/325s.  she will need regular follow up of labs for the next several months while on MTX then periodically.  We will have her get labs in about 3 weeks (CBC with diff, Cmet) and follow up about 1-2 days after that to see how her arthritis is doing.    Orders: Miscellaneous Lab Charge-[facility] [id number]) [facility]- Est Level  3 ([id number])  Complete Medication List: 1)  Bayer Childrens Aspirin 81 Mg Chew (Aspirin) .... Take 1 tablet by mouth once a day 2)  Crestor 10 Mg Tabs (Rosuvastatin calcium) .... Take 1 tablet by mouth at bedtime 3)  Metformin Hcl 1000 Mg Tabs (Metformin hcl) .... Take 1 tablet by mouth two times a day 4)  Nasacort Aq 55 Mcg/act Aers (Triamcinolone acetonide(nasal)) .... Inhale 2 sprays in each nostril as directed once a day 5)  Celexa 40 Mg Tabs (Citalopram hydrobromide) .... Take one tab by mouth daily 6)  Clarinex-d 24 Hour 5-240 Mg Xr24h-tab (Desloratadine-pseudoephedrine) .... One  by mouth daily for allergies 7)  Protonix 40 Mg Tbec (Pantoprazole sodium) .... One by mouth daily 8)  Ventolin Hfa 108 (90 Base) Mcg/act Aers (Albuterol sulfate) .... 9 gm mdi for $9 2 puffs qid as needed for wheeze 9)  Percocet 10-325 Mg Tabs (Oxycodone-acetaminophen) .Marland Kitchen.. 1 by mouth q6hr as needed severe pain. 10)  Nitrostat 0.4 Mg Subl (Nitroglycerin) .... One by mouth as needed for chest pain; may repeat 2 more times every 5 minutes; if chest pain does not resolve after three doses, call 911 11)  Methotrexate 2.5 Mg Tabs (Methotrexate sodium) .... 3  tabs by mouth every friday.  disp 1 mo supply 12)  Folic Acid 1 Mg Tabs (Folic acid) .Marland Kitchen.. 1 by mouth once daily every day except friday. 13)  Prednisone 10 Mg Tabs (Prednisone) .... 6po once daily x10d then 5 x2d then 4x2d then 3x2d then 2x2d then 1x2d then stop. disp qs  Other Orders: Hepatic-[facility] ([id number]) Future Orders: Comp Met-[facility] ([id number]) ... 11/30/2010 CBC w/Diff-[facility] ([id number]) ... 12/07/2010  Patient Instructions: 1)  Please follow up for labs on Tuesday or Wednesday July 5th or 6th for lab work and then an appt with your PCP on July 7th or 8th to see how your arthritis is doing and to discuss lab results. 2)  Call if things worsen.  Prescriptions: PREDNISONE 10 MG TABS (PREDNISONE) 6po once daily x10d then 5 x2d then 4x2d then 3x2d then 2x2d then 1x2d then stop. disp QS  #QS x 0   Entered and Authorized by:   Ancil Boozer  MD   Signed by:   Ancil Boozer  MD on 12/14/2009   Method used:   Handwritten   RxID:   [id number] FOLIC ACID 1 MG TABS (FOLIC ACID) 1 by mouth once daily every day except friday.  #30 x 3   Entered and Authorized by:   Ancil Boozer  MD   Signed by:   Ancil Boozer  MD on 12/14/2009   Method used:   Handwritten   RxID:   [id number] METHOTREXATE 2.5 MG TABS (METHOTREXATE SODIUM) 3 tabs by mouth every friday.  disp 1 mo supply  #1 x 3   Entered and Authorized by:   Ancil Boozer  MD    Signed by:   Ancil Boozer  MD on 12/14/2009   Method used:   Handwritten   RxID:   [id number] PERCOCET 10-325 MG TABS (OXYCODONE-ACETAMINOPHEN) 1 by mouth q6hr as needed severe pain.  #60 x 0   Entered and Authorized by:   Ancil Boozer  MD   Signed by:   Ancil Boozer  MD on 12/14/2009   Method used:   Handwritten   RxID:   [id number]

## 2010-07-31 NOTE — Progress Notes (Signed)
Summary: Rx Req  Phone Note Refill Request Call back at Home Phone [phone] Message from:  Patient  Refills Requested: Medication #1:  METHOTREXATE 2.5 MG TABS 6 tabs by mouth every friday.  disp 1 mo supply ALSO AN RX FOR YEAST INFECTION.  PT GETS MEDS AT THE HEALTH DEPT.  HAS TO BE FAXED CAN NOT BE SENT ELECTRONICALLY.  Initial call taken by: Clydell Hakim,  April 02, 2010 3:07 PM Caller: Patient  Follow-up for Phone Call        both medications sent in will have blue team tell pt.  Follow-up by: Antoine Primas DO,  April 02, 2010 8:06 PM    New/Updated Medications: DIFLUCAN 100 MG TABS (FLUCONAZOLE) take 1 tablet by mouth Prescriptions: DIFLUCAN 100 MG TABS (FLUCONAZOLE) take 1 tablet by mouth  #1 x 1   Entered and Authorized by:   Antoine Primas DO   Signed by:   Antoine Primas DO on 04/02/2010   Method used:   Faxed to ...       [facility] (retail)       [address] [place], [place]  [address]       Ph: [phone]       Fax: [phone]   RxID:   [id number] METHOTREXATE 2.5 MG TABS (METHOTREXATE SODIUM) 6 tabs by mouth every friday.  disp 1 mo supply  #24 x 1   Entered and Authorized by:   Antoine Primas DO   Signed by:   Antoine Primas DO on 04/02/2010   Method used:   Faxed to ...       [facility] Department (retail)       [address] [place], [place]  [address]       Ph: [phone]       Fax: [phone]   RxID:   [id number]   Appended Document: Rx Req Called and LMOVM that "the two rxs she requested was sent to the health department"

## 2010-07-31 NOTE — Progress Notes (Signed)
Summary: phn msg  Phone Note Call from Patient Call back at [phone]   Caller: Patient Summary of Call: Pt wondering if Larita Fife got her info about her orange card.  Was to be referred to an eye doctor. Initial call taken by: Clydell Hakim,  April 04, 2010 9:34 AM  Follow-up for Phone Call        spoke with patient and advised that referral was sent in 03/16/2010 and I called to check on yesterday and [facility] should be  notifying  patient soon about an appointment as they hope to have appointment available in the near future for eye doctor. Follow-up by: Theresia Lo RN,  April 04, 2010 3:40 PM

## 2010-07-31 NOTE — Assessment & Plan Note (Signed)
Summary: L WRIST PAIN,MC   Vital Signs:  Patient profile:   60 year old female BP sitting:   144 / 86  Vitals Entered By: Lillia Pauls CMA (October 26, 2009 9:01 AM)  Primary Provider:  Myrtie Soman  MD   History of Present Illness: 60 yo F here with left wrist pain.  Patient reports migratory arthralgias just over the past 2-3 months. Has had pains lasting only a few days with associated swelling and redness in her bilateral shoulders, bilateral wrists, left jaw, and right knee. Most recently left wrist pain came on Saturday and is now easing - only mild pain and no more swelling/redness. Sister has rheumatoid arthritis. Patient had CBC with diff, RF, ANA, and ESR in Feb.  The ESR was 33 but other labs were normal.  She also had a BMP with Cr 0.56 and TSH 1.481 when in hospital in february. Unfortunately does not have insurance so could not get in to see a specialist.  Allergies (verified): 1)  ! Sulfa  Family History: Reviewed history from 08/23/2009 and no changes required. Dad died at 36 2/2 CVA, MI; he also had DM,  Mom died at 30 2/2 complications associated w/ PNA, One brother healthy (12),  One brother with inoperable lung CA (49)-died Nov 18, 2022, One sister with DM (24), One sister with RA (25),  Three kids all healthy  Physical Exam  General:  Well-developed,well-nourished,in no acute distress; alert,appropriate and cooperative throughout examination Msk:  Examination of shoulders, elbows, wrists, fingers, hips, knees, ankles shows no current evidence of synovitis, swelling, warmth.  No red streaks or rashes noted. FROM all joints except R shoulder lacks about 10 degrees of abduction and flexion. TTP radioulnar joint and ulnar side of lunate left wrist.  Otherwise no TTP of the above joints. Neg tinels. Strength in wrist flex/extension, finger abduction, thumb opposition 5/5. Sensation intact to light touch distally.  Radial pulses 2+.   Impression &  Recommendations:  Problem # 1:  POLYARTHRITIS (ICD-719.49) Assessment New With patient's joint pain involving several joints especially having inflammatory complaints (swelling, redness, apparent synovitis), concern for inflammatory arthritis.  Will not repeat labwork done in February.  Can take up to a year for markers to be positive however.  For patient's age, ESR is borderline high normal/elevated.  Will check C-reactive protein.  Treat with 10 day course of oral prednisone.  May be worthwhile to see if she can get in with an academic center rheumatologist [facility], [facility], or [facility]) given that she does not have insurance for their input on patient's disease processes.  Complete Medication List: 1)  Bayer Childrens Aspirin 81 Mg Chew (Aspirin) .... Take 1 tablet by mouth once a day 2)  Crestor 10 Mg Tabs (Rosuvastatin calcium) .... Take 1 tablet by mouth at bedtime 3)  Metformin Hcl 1000 Mg Tabs (Metformin hcl) .... Take 1 tablet by mouth two times a day 4)  Nasacort Aq 55 Mcg/act Aers (Triamcinolone acetonide(nasal)) .... Inhale 2 sprays in each nostril as directed once a day 5)  Celexa 40 Mg Tabs (Citalopram hydrobromide) .... Take one tab by mouth daily 6)  Clarinex-d 24 Hour 5-240 Mg Xr24h-tab (Desloratadine-pseudoephedrine) .... One by mouth daily for allergies 7)  Klonopin 0.5 Mg Tabs (Clonazepam) .... Take one tablet two times a day as needed for anxiety 8)  Protonix 40 Mg Tbec (Pantoprazole sodium) .... One by mouth daily 9)  Ventolin Hfa 108 (90 Base) Mcg/act Aers (Albuterol sulfate) .... 9 gm mdi for $  9 2 puffs qid as needed for wheeze 10)  Prednisone 20 Mg Tabs (Prednisone) .... 2 tabs by mouth daily x 5 days, 1 tab by mouth daily x 5 days  Patient Instructions: 1)  Take prednisone as directed. 2)  I will call you with your lab results. 3)  I will recommend that your doctor try to arrange for you to see a specialist at an academic center for their recommendations. 4)  A lot of  the other bloodwork for inflammatory arthritis conditions can take a year to come back positive. 5)  You can wear the braces as needed. 6)  Follow up with [place] here as needed. Prescriptions: PREDNISONE 20 MG TABS (PREDNISONE) 2 tabs by mouth daily x 5 days, 1 tab by mouth daily x 5 days  #15 x 0   Entered and Authorized by:   Norton Blizzard MD   Signed by:   Norton Blizzard MD on 10/26/2009   Method used:   Print then Give to Patient   RxID:   [id number]

## 2010-07-31 NOTE — Progress Notes (Signed)
Summary: triage  Phone Note Call from Patient Call back at [phone]   Caller: Patient Summary of Call: has a red & swollen place on leg and is concerned b/c she has DM Initial call taken by: De Nurse,  October 13, 2009 3:46 PM  Follow-up for Phone Call        patient states she first notice the area yesterday but it has gotten worse today . is red and hot to touch and is  tender to touch. Marland Kitchen area is on outer lower left leg. size of a 50 cent piece. has a black center. her grandaughter was diagnosed with MRSA yesterday. advised her to go to urgent care for evaluation today and she states she will. Follow-up by: Theresia Lo RN,  October 13, 2009 4:08 PM

## 2010-07-31 NOTE — Progress Notes (Signed)
  Phone Note Call from Patient   Caller: Patient Call For: [phone] Summary of Call: Dr. Kelby Aline, from [facility] gave results from tests, that showed pat having Osteoarthritis and Rheumatoid Arthritis.  Pt is to stay on same meds for condition, but need to increase dosage of Methotrexate to 6 tabs every Friday.  Also to continue Folic Acid.  Pt need rxs for meds [facility] to [facility] with [facility].  Initial call taken by: Britta Mccreedy mcgregor  Follow-up for Phone Call        sent meds in can we call pt please.  Follow-up by: Antoine Primas DO,  March 09, 2010 12:24 PM    New/Updated Medications: METHOTREXATE 2.5 MG TABS (METHOTREXATE SODIUM) 6 tabs by mouth every friday.  disp 1 mo supply FOLIC ACID 1 MG TABS (FOLIC ACID) 1 by mouth once daily every day except friday. Prescriptions: FOLIC ACID 1 MG TABS (FOLIC ACID) 1 by mouth once daily every day except friday.  #90 x 3   Entered and Authorized by:   Antoine Primas DO   Signed by:   Antoine Primas DO on 03/09/2010   Method used:   Faxed to ...       [facility] Department (retail)       [address] [place], [place]  [address]       Ph: [phone]       Fax: [phone]   RxID:   [id number]

## 2010-07-31 NOTE — Progress Notes (Signed)
Summary: triage  Phone Note Call from Patient Call back at Home Phone [phone]   Caller: Patient Summary of Call: Pt is requesting a rx for Klonipin.  [facility] is [facility] [name] Initial call taken by: Clydell Hakim,  January 23, 2010 3:38 PM  Follow-up for Phone Call        to pcp Follow-up by: [name] RN,  January 23, 2010 3:43 PM  Additional Follow-up for Phone Call Additional follow up Details #1::        called pt she stated she is very stressed and is having some problems with her grand-daughter and is very stressed.  scheduled appt with me Aug 8th.  Will give her enough to get to the appointment.  Additional Follow-up by: Antoine Primas DO,  January 23, 2010 3:50 PM    New/Updated Medications: KLONOPIN 0.5 MG TABS (CLONAZEPAM) 1 tab by mouth two times a day as needed for anxiety Prescriptions: KLONOPIN 0.5 MG TABS (CLONAZEPAM) 1 tab by mouth two times a day as needed for anxiety  #20 x 0   Entered and Authorized by:   Antoine Primas DO   Signed by:   Antoine Primas DO on 01/23/2010   Method used:   Telephoned to ...       [facility]  [name] [phone]* (retail)       [address]       [place], [place]  [id number]       Ph: [phone] or [phone]       Fax: [phone]   RxID:   [id number]

## 2010-07-31 NOTE — Progress Notes (Signed)
Summary: Triage  Phone Note Call from Patient Call back at Home Phone [phone]   Reason for Call: Talk to Nurse Summary of Call: pt having problems with arthritis, has appt thu but wants to know if she should come sooner Initial call taken by: Knox Royalty,  December 04, 2009 3:37 PM  Follow-up for Phone Call        [facility] [address]  states the diclofenac is too expensive $40 per month. wants something that will help but much less expensive has percocet. states she is in terrible pain. wants to wait for pcp only. told her to take a percocet but do not drive or operate machinery. told her if she changes her mind we can get her in tomorrow Follow-up by: [name] RN,  December 04, 2009 3:42 PM

## 2010-07-31 NOTE — Progress Notes (Signed)
Summary: triage  Phone Note Call from Patient Call back at Home Phone [phone]   Caller: Patient Summary of Call: Wants to talk to nurse about her knee and the medication she is on. Initial call taken by: Clydell Hakim,  December 18, 2009 10:22 AM  Follow-up for Phone Call        states she has yeast infection due to the prednisone & elevated sugars. wants more than one pill called to health department as she has 16 more days to take it. states sugars have been in 300 & above. discussed howe prednisone will elevate it but drink plenty of water & do seated exercises (gave examples) to help bring it down. also keep a food diary to identify problem area.  Follow-up by: [name] RN,  December 18, 2009 10:48 AM

## 2010-07-31 NOTE — Assessment & Plan Note (Signed)
Summary: shoulder injection   Vital Signs:  Patient profile:   60 year old female Height:      63 inches Weight:      171.5 pounds BMI:     30.49 Temp:     98.3 degrees F oral Pulse rate:   94 / minute BP sitting:   129 / 82  (left arm) Cuff size:   regular  Vitals Entered By: Garen Grams LPN (October 18, 2009 2:34 PM) CC: shoulder injection Is Patient Diabetic? Yes Did you bring your meter with you today? No Pain Assessment Patient in pain? yes     Location: right shoulder   Primary Care Provider:  Myrtie Soman  MD  CC:  shoulder injection.  History of Present Illness: 1. R shoulder  Has had chronic pain in the right shoulder. Noticed that it was hurting more after lifting boxes on Sunday. Had difficulty sleeping on Sunday. Ibuprofen has helped the pain. Hasn't taken the tramadol because it makes her sleepy. Diagnosed with torn rotator cuff on L and we performed a steroid injection in January with good benefit. Pt would like the same treatment for the right shoulder.   ROS: has had multiple joint aches on and off for the last couple of months. Has also had a few boils recently that have resolved with hot compresses.   CBC, RF, ANA, ESR all normal in February.  Habits & Providers  Alcohol-Tobacco-Diet     Tobacco Status: never  Current Medications (verified): 1)  Bayer Childrens Aspirin 81 Mg Chew (Aspirin) .... Take 1 Tablet By Mouth Once A Day 2)  Crestor 10 Mg Tabs (Rosuvastatin Calcium) .... Take 1 Tablet By Mouth At Bedtime 3)  Metformin Hcl 1000 Mg Tabs (Metformin Hcl) .... Take 1 Tablet By Mouth Two Times A Day 4)  Nasacort Aq 55 Mcg/act Aers (Triamcinolone Acetonide(Nasal)) .... Inhale 2 Sprays in Each Nostril As Directed Once A Day 5)  Celexa 40 Mg  Tabs (Citalopram Hydrobromide) .... Take One Tab By Mouth Daily 6)  Clarinex-D 24 Hour 5-240 Mg Xr24h-Tab (Desloratadine-Pseudoephedrine) .... One By Mouth Daily For Allergies 7)  Klonopin 0.5 Mg  Tabs  (Clonazepam) .... Take One Tablet Two Times A Day As Needed For Anxiety 8)  Protonix 40 Mg Tbec (Pantoprazole Sodium) .... One By Mouth Daily 9)  Ventolin Hfa 108 (90 Base) Mcg/act Aers (Albuterol Sulfate) .... 9 Gm Mdi For $9 2 Puffs Qid As Needed For Wheeze  Allergies (verified): 1)  ! Sulfa  Review of Systems       review of systems as noted in HPI section   Physical Exam  General:  vital signs reviewed and normal Alert, appropriate; well-dressed and well-nourished  Msk:  R shoulder: normal to inspection, tenderness to palpation at insertion of deltoid into humerus, also at [facility] joint. + painful arc; + neer, hawkins, empty-can testing. + apprehension signs. Weakness and pain with external rotation; poor ROM with ER and IR of R shoulder.  Additional Exam:  Patient given informed consent for injection. Appropriate verbal time out taken. Area cleaned and prepped in usual sterile fashion. 1- cc kennalog 40 plus 4 -cc 1% lidocaine without epinephrine was injected into the R shoulder-. Patient tolerated procedure well without any apparent complications.    Impression & Recommendations:  Problem # 1:  SHOULDER PAIN, RIGHT (ICD-719.41)  + signs of rotator cuff syndrome and impingment. Possibly also some subacromial bursitis. Injection performed today. Continue NSAID for now.  Consider MRI but would not  change management at this point as she is uninsured and surgical repair is not an option. Given multiple joint complaints, may have a migrating polyarthralgia syndrome, possibly viral in etiology; however duration of 2 months would be unusual.  Continue to follow.  The following medications were removed from the medication list:    Tramadol Hcl 50 Mg Tabs (Tramadol hcl) .Marland Kitchen... 1-2 tabs by mouth every 6 hours as needed    Vicodin 5-500 Mg Tabs (Hydrocodone-acetaminophen) .Marland Kitchen... 1-2 tabs by mouth every 6 hours as needed for knee pain Her updated medication list for this problem includes:    Bayer  Childrens Aspirin 81 Mg Chew (Aspirin) .Marland Kitchen... Take 1 tablet by mouth once a day  Orders: [facility]- Est Level  3 (91478) Injection, large joint- [facility] (29562)  Complete Medication List: 1)  Bayer Childrens Aspirin 81 Mg Chew (Aspirin) .... Take 1 tablet by mouth once a day 2)  Crestor 10 Mg Tabs (Rosuvastatin calcium) .... Take 1 tablet by mouth at bedtime 3)  Metformin Hcl 1000 Mg Tabs (Metformin hcl) .... Take 1 tablet by mouth two times a day 4)  Nasacort Aq 55 Mcg/act Aers (Triamcinolone acetonide(nasal)) .... Inhale 2 sprays in each nostril as directed once a day 5)  Celexa 40 Mg Tabs (Citalopram hydrobromide) .... Take one tab by mouth daily 6)  Clarinex-d 24 Hour 5-240 Mg Xr24h-tab (Desloratadine-pseudoephedrine) .... One by mouth daily for allergies 7)  Klonopin 0.5 Mg Tabs (Clonazepam) .... Take one tablet two times a day as needed for anxiety 8)  Protonix 40 Mg Tbec (Pantoprazole sodium) .... One by mouth daily 9)  Ventolin Hfa 108 (90 Base) Mcg/act Aers (Albuterol sulfate) .... 9 gm mdi for $9 2 puffs qid as needed for wheeze  Patient Instructions: 1)  ice your arm tonight 2)  do the rotator cuff exercises 3)  follow-up with me in May.   Prevention & Chronic Care Immunizations   Influenza vaccine: Fluvax Non-MCR  (05/02/2009)   Influenza vaccine due: 06/11/2008    Tetanus booster: 01/03/2005: Done.   Tetanus booster due: 01/04/2015    Pneumococcal vaccine: Not documented  Colorectal Screening   Hemoccult: Not documented   Hemoccult due: Not Indicated    Colonoscopy: normal  (05/07/2006)   Colonoscopy due: 05/08/2011  Other Screening   Pap smear: Not documented   Pap smear due: Not Indicated    Mammogram: ASSESSMENT: Negative - BI-RADS 1^MS DIGITAL SCREENING  (05/29/2009)   Mammogram due: 05/29/2010   Smoking status: never  (10/18/2009)  Diabetes Mellitus   HgbA1C: 7.0  (07/05/2009)   Hemoglobin A1C due: 09/10/2007    Eye exam: Not documented    Foot exam: yes   (09/15/2009)   High risk foot: Not documented   Foot care education: Not documented   Foot exam due: 07/12/2008    Urine microalbumin/creatinine ratio: Not documented   Urine microalbumin/cr due: 07/12/2008    Diabetes flowsheet reviewed?: Yes   Progress toward A1C goal: At goal  Lipids   Total Cholesterol: 119  (08/22/2008)   LDL: 58  (08/15/2009)   LDL Direct: 65  (06/12/2007)   HDL: 48  (08/22/2008)   Triglycerides: 126  (08/22/2008)    SGOT (AST): 11  (08/22/2008)   SGPT (ALT): 14  (08/22/2008)   Alkaline phosphatase: 62  (08/22/2008)   Total bilirubin: 0.4  (08/22/2008)    Lipid flowsheet reviewed?: Yes   Progress toward LDL goal: At goal  Self-Management Support :   Personal Goals (by the next clinic  visit) :     Personal A1C goal: 7  (03/09/2009)     Personal blood pressure goal: 130/80  (03/09/2009)     Personal LDL goal: 70  (03/09/2009)    Diabetes self-management support: Written self-care plan, Referred for DM self-management training  (09/15/2009)    Diabetes self-management support not done because: Not indicated  (08/30/2009)    Lipid self-management support: Written self-care plan  (09/15/2009)     Lipid self-management support not done because: Not indicated  (08/30/2009)

## 2010-07-31 NOTE — Assessment & Plan Note (Signed)
Summary: f/u RA, DM, and anxiety   Vital Signs:  Patient profile:   60 year old female Height:      63 inches Weight:      171.5 pounds BMI:     30.49 Temp:     98.1 degrees F oral Pulse rate:   92 / minute BP sitting:   133 / 83  (left arm) Cuff size:   regular  Vitals Entered By: Garen Grams LPN (February 06, 2010 1:33 PM)  CC: f/u Is Patient Diabetic? Yes Did you bring your meter with you today? No Pain Assessment Patient in pain? yes     Location: back/neck Intensity: 7   Primary Care Provider:  Antoine Primas DO  CC:  f/u.  History of Present Illness: 60 yo female here for f/u on multiple issues.  RA:: Pt has been taking the MTX, folate and percocet.  Pt states thatshe has been doing about the same and is mostly having neck and back pain without much relief from the medicine. The percocet does take the edge off to help her with her dialy activities but pain never fully goes away.  Pt does have an appoointment to be seen at [facility] on the 30th of this month.   DM-  been checking blood sugars been relatively well since being off prednisone, would like to avoid prednisone again unless that is the only thing that would help her pain.  Pt last A1c was 7.7 in 5/11.  Will get one today. Pt states last check of blood sugar was 165.  Anxiety-  Pt staes a lot has happen in the last month with her grandaughter and she is unable to gain control over her emotions. Pt states the klonopin helps a lot and that it does make a difference. Pt states she is working on coping mechanisms and is doing well overall but needs to have the klonopin available. Denies that it makes her tired or hard to concentrate.    Habits & Providers  Alcohol-Tobacco-Diet     Tobacco Status: never  Current Medications (verified): 1)  Bayer Childrens Aspirin 81 Mg Chew (Aspirin) .... Take 1 Tablet By Mouth Once A Day 2)  Crestor 10 Mg Tabs (Rosuvastatin Calcium) .... Take 1 Tablet By Mouth At Bedtime 3)  Metformin  Hcl 1000 Mg Tabs (Metformin Hcl) .... Take 1 Tablet By Mouth Two Times A Day 4)  Nasacort Aq 55 Mcg/act Aers (Triamcinolone Acetonide(Nasal)) .... Inhale 2 Sprays in Each Nostril As Directed Once A Day 5)  Celexa 40 Mg  Tabs (Citalopram Hydrobromide) .... Take One Tab By Mouth Daily 6)  Clarinex-D 24 Hour 5-240 Mg Xr24h-Tab (Desloratadine-Pseudoephedrine) .... One By Mouth Daily For Allergies 7)  Protonix 40 Mg Tbec (Pantoprazole Sodium) .... One By Mouth Daily 8)  Ventolin Hfa 108 (90 Base) Mcg/act Aers (Albuterol Sulfate) .... 9 Gm Mdi For $9 2 Puffs Qid As Needed For Wheeze 9)  Percocet 10-325 Mg Tabs (Oxycodone-Acetaminophen) .Marland Kitchen.. 1 By Mouth Q6hr As Needed Severe Pain. 10)  Nitrostat 0.4 Mg Subl (Nitroglycerin) .... One By Mouth As Needed For Chest Pain; May Repeat 2 More Times Every 5 Minutes; If Chest Pain Does Not Resolve After Three Doses, Call 911 11)  Methotrexate 2.5 Mg Tabs (Methotrexate Sodium) .... 3 Tabs By Mouth Every Friday.  Disp 1 Mo Supply 12)  Folic Acid 1 Mg Tabs (Folic Acid) .Marland Kitchen.. 1 By Mouth Once Daily Every Day Except Friday. 13)  Klonopin 0.5 Mg Tabs (Clonazepam) .Marland KitchenMarland KitchenMarland Kitchen 1  Tab By Mouth Two Times A Day As Needed For Anxiety  Allergies (verified): 1)  ! Sulfa  Social History: Smoking Status:  never  Review of Systems       denies fever, chills, nausea, vomiting, diarrhea or constipation   Physical Exam  General:  vitals signs reviewed -- WNL except for mild tachycardia.  appears uncomfortable.  well devoloped.  well hydrated Eyes:  conjunctivae pink, sclerae clear  Mouth:  oropharynx pink, moist; no erythema or exudate  Lungs:  work of breathing unlabored, clear to auscultation bilaterally; no wheezes, rales, or ronchi; good air movement throughout  Heart:  regular rate and rhythm, no murmurs; normal s1/s2  Abdomen:  soft, non-tender, normal bowel sounds, and no distention.   Msk:  right knee no warmness, mildly swollen but without large effusion.  no erythema.   diminished ROM due to pain.  able to bear weight.  exquisitely tender to palpation along joint lines  back and neck have some signs of arthritis, pain with full rotation or movement in any planes, no radiation of pain, no erythema, no real localization.  Pulses:  DP and radial pulses 2+ bilaterally  Extremities:  trace le edema b/l   Impression & Recommendations:  Problem # 1:  RHEUMATOID ARTHRITIS (ICD-714.0) Assessment Unchanged Will continue the MTX, folic acid and percocet. Pt to be seen in 2 weeks at [facility] by rheumatolgy and would appreciate theire advice.  Her updated medication list for this problem includes:    Bayer Childrens Aspirin 81 Mg Chew (Aspirin) .Marland Kitchen... Take 1 tablet by mouth once a day    Methotrexate 2.5 Mg Tabs (Methotrexate sodium) .Marland KitchenMarland KitchenMarland KitchenMarland Kitchen 3 tabs by mouth every friday.  disp 1 mo supply  Orders: [facility]- Est  Level 4 (04540)  Problem # 2:  DIABETES MELLITUS II, UNCOMPLICATED (ICD-250.00) Assessment: Deteriorated  A1c is worsening, do want to start her on a low dose of glypizide but at present moment feel that with her anxiety level and with her RA this is not a goos time.  Will see again in 1 month and will start medicine at that time. Will refer for optholmolgy.  Her updated medication list for this problem includes:    Bayer Childrens Aspirin 81 Mg Chew (Aspirin) .Marland Kitchen... Take 1 tablet by mouth once a day    Metformin Hcl 1000 Mg Tabs (Metformin hcl) .Marland Kitchen... Take 1 tablet by mouth two times a day  Orders: A1C-[facility] (98119) Ophthalmology Referral (Ophthalmology) [facility]- Est  Level 4 [phone])  Labs Reviewed: Creat: 0.56 (01/29/2010)   Microalbumin: NEGATVIE (07/13/2007) Reviewed HgBA1c results: 8.4 (02/06/2010)  7.7 (11/07/2009)  Problem # 3:  ROTATOR CUFF TEAR (ICD-727.61) stated, no real change in plan, decline ortho consult.  Orders: [facility]- Est  Level 4 (95621)  Problem # 4:  ANXIETY (ICD-300.00) Will continue klonpin, pt is on celexa 40, if does not improve mood, will  consider changing anti-depressant to help pt cope better and maybe treat underlying depression.  Her updated medication list for this problem includes:    Celexa 40 Mg Tabs (Citalopram hydrobromide) .Marland Kitchen... Take one tab by mouth daily    Klonopin 0.5 Mg Tabs (Clonazepam) .Marland Kitchen... 1 tab by mouth two times a day as needed for anxiety  Orders: [facility]- Est  Level 4 (30865)  Complete Medication List: 1)  Bayer Childrens Aspirin 81 Mg Chew (Aspirin) .... Take 1 tablet by mouth once a day 2)  Crestor 10 Mg Tabs (Rosuvastatin calcium) .... Take 1 tablet by mouth at bedtime  3)  Metformin Hcl 1000 Mg Tabs (Metformin hcl) .... Take 1 tablet by mouth two times a day 4)  Nasacort Aq 55 Mcg/act Aers (Triamcinolone acetonide(nasal)) .... Inhale 2 sprays in each nostril as directed once a day 5)  Celexa 40 Mg Tabs (Citalopram hydrobromide) .... Take one tab by mouth daily 6)  Clarinex-d 24 Hour 5-240 Mg Xr24h-tab (Desloratadine-pseudoephedrine) .... One by mouth daily for allergies 7)  Protonix 40 Mg Tbec (Pantoprazole sodium) .... One by mouth daily 8)  Ventolin Hfa 108 (90 Base) Mcg/act Aers (Albuterol sulfate) .... 9 gm mdi for $9 2 puffs qid as needed for wheeze 9)  Percocet 10-325 Mg Tabs (Oxycodone-acetaminophen) .Marland Kitchen.. 1 by mouth q6hr as needed severe pain. 10)  Nitrostat 0.4 Mg Subl (Nitroglycerin) .... One by mouth as needed for chest pain; may repeat 2 more times every 5 minutes; if chest pain does not resolve after three doses, call 911 11)  Methotrexate 2.5 Mg Tabs (Methotrexate sodium) .... 3 tabs by mouth every friday.  disp 1 mo supply 12)  Folic Acid 1 Mg Tabs (Folic acid) .Marland Kitchen.. 1 by mouth once daily every day except friday. 13)  Klonopin 0.5 Mg Tabs (Clonazepam) .Marland Kitchen.. 1 tab by mouth two times a day as needed for anxiety  Patient Instructions: 1)  Good to see you 2)  I am refilling all your medications. 3)  I will send in a referral for your eye exam they will call you when a appointment is made 4)   Please keep your appointment with the rheumatologist  at the end of the month. I need to see you again in 1 month.  At that time we will get more labs 5)  Wear sunscreen when outdoors.  Prescriptions: KLONOPIN 0.5 MG TABS (CLONAZEPAM) 1 tab by mouth two times a day as needed for anxiety  #60 x 0   Entered and Authorized by:   Antoine Primas DO   Signed by:   Antoine Primas DO on 02/06/2010   Method used:   Print then Give to Patient   RxID:   [id number] FOLIC ACID 1 MG TABS (FOLIC ACID) 1 by mouth once daily every day except friday.  #30 x 3   Entered and Authorized by:   Antoine Primas DO   Signed by:   Antoine Primas DO on 02/06/2010   Method used:   Print then Give to Patient   RxID:   [id number] METHOTREXATE 2.5 MG TABS (METHOTREXATE SODIUM) 3 tabs by mouth every friday.  disp 1 mo supply  #1 x 3   Entered and Authorized by:   Antoine Primas DO   Signed by:   Antoine Primas DO on 02/06/2010   Method used:   Print then Give to Patient   RxID:   [id number] PERCOCET 10-325 MG TABS (OXYCODONE-ACETAMINOPHEN) 1 by mouth q6hr as needed severe pain.  #60 x 0   Entered and Authorized by:   Antoine Primas DO   Signed by:   Antoine Primas DO on 02/06/2010   Method used:   Print then Give to Patient   RxID:   [id number] KLONOPIN 0.5 MG TABS (CLONAZEPAM) 1 tab by mouth two times a day as needed for anxiety  #60 x 0   Entered and Authorized by:   Antoine Primas DO   Signed by:   Antoine Primas DO on 02/06/2010   Method used:   Print then Give to Patient   RxID:   [id number] FOLIC ACID 1 MG TABS (FOLIC  ACID) 1 by mouth once daily every day except friday.  #30 x 3   Entered and Authorized by:   Antoine Primas DO   Signed by:   Antoine Primas DO on 02/06/2010   Method used:   Print then Give to Patient   RxID:   [id number] METHOTREXATE 2.5 MG TABS (METHOTREXATE SODIUM) 3 tabs by mouth every friday.  disp 1 mo supply  #1 x 3   Entered and Authorized by:   Antoine Primas  DO   Signed by:   Antoine Primas DO on 02/06/2010   Method used:   Print then Give to Patient   RxID:   [id number] PERCOCET 10-325 MG TABS (OXYCODONE-ACETAMINOPHEN) 1 by mouth q6hr as needed severe pain.  #60 x 0   Entered and Authorized by:   Antoine Primas DO   Signed by:   Antoine Primas DO on 02/06/2010   Method used:   Print then Give to Patient   RxID:   [id number]    Prevention & Chronic Care Immunizations   Influenza vaccine: Fluvax Non-MCR  (05/02/2009)   Influenza vaccine due: 06/11/2008    Tetanus booster: 01/03/2005: Done.   Tetanus booster due: 01/04/2015    Pneumococcal vaccine: Not documented  Colorectal Screening   Hemoccult: Not documented   Hemoccult due: Not Indicated    Colonoscopy: normal  (05/07/2006)   Colonoscopy due: 05/08/2011  Other Screening   Pap smear: Not documented   Pap smear due: Not Indicated    Mammogram: ASSESSMENT: Negative - BI-RADS 1^MS DIGITAL SCREENING  (05/29/2009)   Mammogram due: 05/29/2010   Smoking status: never  (02/06/2010)  Diabetes Mellitus   HgbA1C: 8.4  (02/06/2010)   Hemoglobin A1C due: 09/10/2007    Eye exam: Not documented   Diabetic eye exam action/deferral: Ophthalmology referral  (02/06/2010)    Foot exam: yes  (09/15/2009)   High risk foot: Not documented   Foot care education: Not documented   Foot exam due: 07/12/2008    Urine microalbumin/creatinine ratio: Not documented   Urine microalbumin/cr due: 07/12/2008    Diabetes flowsheet reviewed?: Yes   Progress toward A1C goal: Unchanged  Lipids   Total Cholesterol: 119  (08/22/2008)   LDL: 58  (08/15/2009)   LDL Direct: 65  (06/12/2007)   HDL: 48  (08/22/2008)   Triglycerides: 126  (08/22/2008)    SGOT (AST): 12  (01/29/2010)   SGPT (ALT): 16  (01/29/2010)   Alkaline phosphatase: 62  (01/29/2010)   Total bilirubin: 0.4  (01/29/2010)    Lipid flowsheet reviewed?: Yes   Progress toward LDL goal: Unchanged  Self-Management Support  :   Personal Goals (by the next clinic visit) :     Personal A1C goal: 7  (03/09/2009)     Personal blood pressure goal: 130/80  (03/09/2009)     Personal LDL goal: 70  (03/09/2009)    Diabetes self-management support: Written self-care plan, Referred for DM self-management training  (09/15/2009)    Diabetes self-management support not done because: Not indicated  (08/30/2009)    Lipid self-management support: Written self-care plan  (09/15/2009)     Lipid self-management support not done because: Good outcomes  (11/07/2009)   Nursing Instructions: Refer for screening diabetic eye exam (see order)   Laboratory Results   Blood Tests   Date/Time Received: February 06, 2010 1:38  PM  Date/Time Reported: February 06, 2010 1:51 PM   HGBA1C: 8.4%   (Normal Range: Non-Diabetic - 3-6%   Control Diabetic -  6-8%)  Comments: ...............test performed by.................Marland KitchenGaren Grams, LPN .............entered by...........Marland KitchenBonnie A. [place], MLS (ASCP)cm

## 2010-07-31 NOTE — Consult Note (Signed)
Summary: [facility]   Imported By: Clydell Hakim 03/08/2010 16:35:58  _____________________________________________________________________  External Attachment:    Type:   Image     Comment:   External Document

## 2010-08-01 ENCOUNTER — Other Ambulatory Visit: Payer: Self-pay | Admitting: Family Medicine

## 2010-08-01 DIAGNOSIS — IMO0002 Reserved for concepts with insufficient information to code with codable children: Secondary | ICD-10-CM

## 2010-08-02 NOTE — Assessment & Plan Note (Signed)
Summary: f/u,df   Vital Signs:  Patient profile:   60 year old female Height:      63 inches Weight:      174.2 pounds BMI:     30.97 Temp:     98.4 degrees F oral Pulse rate:   84 / minute BP sitting:   150 / 90  (left arm) Cuff size:   regular  Vitals Entered By: Garen Grams LPN (June 12, 2010 9:57 AM) CC: f/u arthritis Is Patient Diabetic? Yes Did you bring your meter with you today? No   Primary Care Porfirio Bollier:  Antoine Primas DO  CC:  f/u arthritis.  History of Present Illness: 1. RA-  .  She has actulally been doing a little better overall "trucking through".  Pt uis still with pain all the time but is able to do all her regular ADL's.  Pt would still like to stop watching the kids at some point if her SSI comes trhrough, but does seem to be doing well with the percocet, and the MTX.  Pt knows that if we worsen pt will have to go back to rheumatology to make change in medication. Pt is due for labs next month.   2.  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  Pt is in the process of getting medicaid and SSI.  Pt is also doing everything such as cooking and having family stay with her over the holidays which makes it a little more stree ful.  Pt is taking klonipin three times a day and doing very well with it. Pt would liek to not come down on dose if possible.     3. DM   glucose low:100 Glucose high:65 Last A1C:8.0 (due next month) Taking Meds:yes On INsulin:no Side effects:no ROS: Denies polyuria, polydipsia, visual changes numbness in extremities, foot ulcers Lifestyle modifications: none Pt was supposed to be referred to an opthomologist but is still having  to wait.  Pt does have [employer] now.    4.right ankle pain-  Pt did twist her ankle and went down at one point last week.  Pt states her ankle swell up and became discolored but has progressively gotten better with time.  Pt states  she has been able to walk on it and only certain movements cause pain.  Pt does not wear tennis shoes. Pt states the swelling is much better and no loss of sensation.    Habits & Providers  Alcohol-Tobacco-Diet     Tobacco Status: quit     Year Quit: 16 years  Current Medications (verified): 1)  Bayer Childrens Aspirin 81 Mg Chew (Aspirin) .... Take 1 Tablet By Mouth Once A Day 2)  Crestor 10 Mg Tabs (Rosuvastatin Calcium) .... Take 1 Tablet By Mouth At Bedtime 3)  Metformin Hcl 1000 Mg Tabs (Metformin Hcl) .... Take 1 Tablet By Mouth Two Times A Day 4)  Nasacort Aq 55 Mcg/act Aers (Triamcinolone Acetonide(Nasal)) .... Inhale 2 Sprays in Each Nostril As Directed Once A Day 5)  Celexa 40 Mg  Tabs (Citalopram Hydrobromide) .... Take One Tab By Mouth Daily 6)  Clarinex-D 24 Hour 5-240 Mg Xr24h-Tab (Desloratadine-Pseudoephedrine) .... One By Mouth Daily For Allergies 7)  Protonix 40 Mg Tbec (Pantoprazole Sodium) .... One By Mouth Daily 8)  Ventolin Hfa 108 (90 Base) Mcg/act Aers (Albuterol Sulfate) .... 9 Gm Mdi For $9 2 Puffs Qid As Needed For Wheeze  9)  Percocet 10-325 Mg Tabs (Oxycodone-Acetaminophen) .Marland Kitchen.. 1 By Mouth Q6hr As Needed Severe Pain. 10)  Nitrostat 0.4 Mg Subl (Nitroglycerin) .... One By Mouth As Needed For Chest Pain; May Repeat 2 More Times Every 5 Minutes; If Chest Pain Does Not Resolve After Three Doses, Call 911 11)  Methotrexate 2.5 Mg Tabs (Methotrexate Sodium) .... 8 Tabs By Mouth Every Friday.  Disp 1 Mo Supply 12)  Folic Acid 1 Mg Tabs (Folic Acid) .Marland Kitchen.. 1 By Mouth Once Daily Every Day Except Friday. 13)  Klonopin 0.5 Mg Tabs (Clonazepam) .Marland Kitchen.. 1 Tab By Mouthtid As Needed For Anxiety 14)  Zyrtec Allergy 10 Mg Caps (Cetirizine Hcl) .Marland Kitchen.. 1 Tab At Bedtime 15)  Prednisone 20 Mg Tabs (Prednisone) .... Take 2 Tabs By Mouth Daily For The Next 5 Days 16)  Doxycycline Hyclate 100 Mg Caps (Doxycycline Hyclate) .Marland Kitchen.. 1 By Mouth Bid  Allergies (verified): 1)  ! Sulfa  Past  History:  Past medical, surgical, family and social histories (including risk factors) reviewed, and no changes noted (except as noted below).  Past Medical History: Reviewed history from 01/08/2010 and no changes required. RA- CCP positive, will be followed at [facility] HLD DM Anxiety Asthma Allergic Rhinitis 2D ECHO 10/99 = EF 55% Negative r/o MI w/u Nov 25, 2022 L rotator cuff tear (old) - diagnosed by MRI 2/11 - steroid injection 08/30/09 negative r/u MI w/u 2/11  Past Surgical History: Reviewed history from 08/23/2009 and no changes required. Appendectomy - 07/05/2004 Bone spur removal left foot - 07/05/2004 Cholecystectomy - 07/05/2004 Colonoscopy-polypectomy - 07/01/1994 Hysterectomy-complete - 07/05/2004 Right carpal tunnel release - 07/05/2004 Right knee surg x2 - 07/05/2004 UGI Series w/ SB followthru-mild GERD - 01/30/1999  Family History: Reviewed history from 12/14/2009 and no changes required. Dad died at 41 2/2 CVA, MI; he also had DM,  Mom died at 86 2/2 complications associated w/ PNA, One brother healthy (16),  One brother with inoperable lung CA (49)-died 2022/11/25, One sister with DM (35), One sister with RA (56) treated with plaquenil, prednisone, MTX Three kids all healthy  Social History: Reviewed history from 11/20/2009 and no changes required. 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 On the MAP program. Lives in trailer. Uses wood for heat. Stressful family situation. 60 yo dtr just had a baby 08/30/09. Lives with Morrie Sheldon (oldest grandchild) and her husband. Morrie Sheldon has intellectual disability  Review of Systems       deneis fever, chills, nausea, vomiting, diarrhea or constipation   Physical Exam  General:  VS noted.  Well NAD Head:  AK on right and left cheek one on forhead as well.  Eyes:  PERRL, EOMI Mouth:  oropharynx pink, moist; no erythema  Lungs:   Normal respiratory effort, chest expands symmetrically. Lungs are clear to auscultation, no crackles or wheezes. Heart:  Normal rate and regular rhythm. S1 and S2 normal without gallop, murmur, click, rub or other extra sounds. Abdomen:  soft, non-tender, normal bowel sounds, and no distention.   Msk:  hands have no swelling b/l, mildly tender to palpation b/l full ROM, mild crepitus of wrist joint on right.   tender to palpation in all joints today, seems very uncomfortable with movement, mor ethan usual.  Pulses:  DP and radial pulses 2+ bilaterally  Extremities:  right ankle mild lateral swelling and tender to palpation full rom full  weight bearing full strength and sensation.  negative anterior drawwer test.  Neurologic:  CN 2-12 intact   Impression & Recommendations:  Problem # 1:  RHEUMATOID ARTHRITIS (ICD-714.0) Pt is doing well.  Will continue current regimen, seems to be stable due for CMET next month due to the MTX. Her updated medication list for this problem includes:    Bayer Childrens Aspirin 81 Mg Chew (Aspirin) .Marland Kitchen... Take 1 tablet by mouth once a day    Methotrexate 2.5 Mg Tabs (Methotrexate sodium) .Marland Kitchen... 8 tabs by mouth every friday.  disp 1 mo supply    Prednisone 20 Mg Tabs (Prednisone) .Marland Kitchen... Take 2 tabs by mouth daily for the next 5 days  Orders: [facility]- Est  Level 4 ([address]40)  Problem # 2:  ASTHMA, INTERMITTENT (ICD-493.90) doing well will continue ventolin  Her updated medication list for this problem includes:    Ventolin Hfa 108 (90 Base) Mcg/act Aers (Albuterol sulfate) .Marland Kitchen... 9 gm mdi for $9 2 puffs qid as needed for wheeze    Prednisone 20 Mg Tabs (Prednisone) .Marland Kitchen... Take 2 tabs by mouth daily for the next 5 days  Orders: [facility]- Est  Level 4 ([address])  Problem # 3:  ANXIETY (ICD-300.00) pt is doing well with klonopin would like to try to have pt come down to two times a day next motnh and will address at that time but not now during the holiday.  Her updated  medication list for this problem includes:    Celexa 40 Mg Tabs (Citalopram hydrobromide) .Marland Kitchen... Take one tab by mouth daily    Klonopin 0.5 Mg Tabs (Clonazepam) .Marland Kitchen... 1 tab by mouthtid as needed for anxiety  Orders: [facility]- Est  Level 4 ([address])  Problem # 4:  ANKLE PAIN (ICD-719.47) likely a 1st degree sprain , no bone tenderness pt able to do all ADL's so will knot change anything at this time.  No brace is needed but told pt laced up s hoes may be benficial . Orders: [facility]- Est  Level 4 ([address]21)  Complete Medication List: 1)  Bayer Childrens Aspirin 81 Mg Chew (Aspirin) .... Take 1 tablet by mouth once a day 2)  Crestor 10 Mg Tabs (Rosuvastatin calcium) .... Take 1 tablet by mouth at bedtime 3)  Metformin Hcl 1000 Mg Tabs (Metformin hcl) .... Take 1 tablet by mouth two times a day 4)  Nasacort Aq 55 Mcg/act Aers (Triamcinolone acetonide(nasal)) .... Inhale 2 sprays in each nostril as directed once a day 5)  Celexa 40 Mg Tabs (Citalopram hydrobromide) .... Take one tab by mouth daily 6)  Clarinex-d 24 Hour 5-240 Mg Xr24h-tab (Desloratadine-pseudoephedrine) .... One by mouth daily for allergies 7)  Protonix 40 Mg Tbec (Pantoprazole sodium) .... One by mouth daily 8)  Ventolin Hfa 108 (90 Base) Mcg/act Aers (Albuterol sulfate) .... 9 gm mdi for $9 2 puffs qid as needed for wheeze 9)  Percocet 10-325 Mg Tabs (Oxycodone-acetaminophen) .Marland Kitchen.. 1 by mouth q6hr as needed severe pain. 10)  Nitrostat 0.4 Mg Subl (Nitroglycerin) .... One by mouth as needed for chest pain; may repeat 2 more times every 5 minutes; if chest pain does not resolve after three doses, call 911 11)  Methotrexate 2.5 Mg Tabs (Methotrexate sodium) .... 8 tabs by mouth every friday.  disp 1 mo supply 12)  Folic Acid 1 Mg Tabs (Folic acid) .Marland Kitchen.. 1 by mouth once daily every day except friday. 13)  Klonopin 0.5 Mg Tabs (Clonazepam) .Marland Kitchen.. 1 tab by mouthtid as needed for  anxiety 14)  Zyrtec Allergy 10 Mg Caps (Cetirizine hcl) .Marland Kitchen.. 1 tab at  bedtime 15)  Prednisone 20 Mg Tabs (Prednisone) .... Take 2 tabs by mouth daily for the next 5 days 16)  Doxycycline Hyclate 100 Mg Caps (Doxycycline hyclate) .Marland Kitchen.. 1 by mouth bid Prescriptions: METHOTREXATE 2.5 MG TABS (METHOTREXATE SODIUM) 8 tabs by mouth every friday.  disp 1 mo supply  #32 x 0   Entered and Authorized by:   Antoine Primas DO   Signed by:   Antoine Primas DO on 06/12/2010   Method used:   Faxed to ...       [facility] Department (retail)       [address] [place], [place]  [address]       Ph: [phone]       Fax: [phone]   RxID:   [id number] VENTOLIN HFA 108 (90 BASE) MCG/ACT AERS (ALBUTEROL SULFATE) 9 GM MDI for $9 2 puffs qid as needed for wheeze  #3 x 3   Entered and Authorized by:   Antoine Primas DO   Signed by:   Antoine Primas DO on 06/12/2010   Method used:   Faxed to ...       [facility] Department (retail)       [address] [place], [place]  [address]       Ph: [phone]       Fax: [phone]   RxID:   [id number] NASACORT AQ 55 MCG/ACT AERS (TRIAMCINOLONE ACETONIDE(NASAL)) Inhale 2 sprays in each nostril as directed once a day  #1 x 2   Entered and Authorized by:   Antoine Primas DO   Signed by:   Antoine Primas DO on 06/12/2010   Method used:   Faxed to ...       [facility] Department (retail)       [address] [place], [place]  [address]       Ph: [phone]       Fax: [phone]   RxID:   [id number] CELEXA 40 MG  TABS (CITALOPRAM HYDROBROMIDE) take one tab by mouth daily  #30 x 3   Entered and Authorized by:   Antoine Primas DO   Signed by:   Antoine Primas DO on 06/12/2010   Method used:   Faxed to ...       [facility] Department (retail)       [address] [place], [place]  [address]       Ph: [phone]       Fax: [phone]   RxID:   [id number] CRESTOR 10 MG TABS (ROSUVASTATIN CALCIUM) Take 1 tablet by mouth at bedtime  #90 x  3   Entered and Authorized by:   Antoine Primas DO   Signed by:   Antoine Primas DO on 06/12/2010   Method used:   Faxed to ...       [facility] Department (retail)       [address] [place], [place]  [address]       Ph: [phone]       Fax: [phone]   RxID:   [id number]    Orders Added: 1)  [facility]- Est  Level 4 [[address]]

## 2010-08-02 NOTE — Progress Notes (Signed)
Summary: triage  Phone Note Call from Patient Call back at Home Phone [phone]   Caller: Patient Summary of Call: finished prednisone and hand is now swollen again and can't use arm.  not sure what to do... Initial call taken by: De Nurse,  July 12, 2010 8:58 AM  Follow-up for Phone Call        states her hand & arm is swollen & very painful. had prednisone before & wants to know if she can get more. appt made in geri clinic this am. pcp not available Follow-up by: [name] RN,  July 12, 2010 9:03 AM

## 2010-08-02 NOTE — Progress Notes (Signed)
  Phone Note Call from Patient   Caller: Patient Call For: [phone] Summary of Call: Ms. Jicha spoke to someone at [facility] about nasal spray.  They do not have meds Flonase, but if rx for Rhinocort or Nasonex could be written, they may be able to supply patient with that one.  Please fax over rx to them asap.   Initial call taken by: Abundio Miu,  June 19, 2010 11:58 AM    New/Updated Medications: NASONEX 50 MCG/ACT SUSP (MOMETASONE FUROATE) 1 spray each nostril daily. Prescriptions: NASONEX 50 MCG/ACT SUSP (MOMETASONE FUROATE) 1 spray each nostril daily.  #3 x 3   Entered and Authorized by:   Antoine Primas DO   Signed by:   Antoine Primas DO on 06/19/2010   Method used:   Faxed to ...       [facility] Department (retail)       [address] [place], [place]  [address]       Ph: [phone]       Fax: [phone]   RxID:   [id number]

## 2010-08-02 NOTE — Assessment & Plan Note (Signed)
Summary: F/U  KH   Vital Signs:  Patient profile:   60 year old female Height:      63 inches Weight:      173.4 pounds BMI:     30.83 Temp:     98.1 degrees F oral Pulse rate:   103 / minute BP sitting:   155 / 87  (left arm) Cuff size:   regular  Vitals Entered By: Garen Grams LPN (July 17, 2010 8:41 AM) CC: f/u arthritis Is Patient Diabetic? Yes Did you bring your meter with you today? No   Primary Care Provider:  Antoine Primas DO  CC:  f/u arthritis.  History of Present Illness: Pt is here for f/u of severe left wrist pain, 2/2  RA.  Pt was even on prednisone at the time of the flare, had extra five day burst and is doing much better now.  Pt states she can now do more activities around the house but still not quite near her baselin.  Pt is scheudled to see [facility] rheum on the 26th of this month.  Pt is on max does MTX without much improvement and seems to be slowly declining.   .  + CPP with apt with Rheum at [facility] on 07/26/10.  Has been on MTX, burts of steroids (4 in past 6 months), and intermittent NSAIDS (normal renal function in diabetic).     Habits & Providers  Alcohol-Tobacco-Diet     Tobacco Status: quit > 6 months     Year Quit: 16 years  Current Medications (verified): 1)  Bayer Childrens Aspirin 81 Mg Chew (Aspirin) .... Take 1 Tablet By Mouth Once A Day 2)  Crestor 10 Mg Tabs (Rosuvastatin Calcium) .... Take 1 Tablet By Mouth At Bedtime 3)  Metformin Hcl 1000 Mg Tabs (Metformin Hcl) .... Take 1 Tablet By Mouth Two Times A Day 4)  Nasacort Aq 55 Mcg/act Aers (Triamcinolone Acetonide(Nasal)) .... Inhale 2 Sprays in Each Nostril As Directed Once A Day 5)  Celexa 40 Mg  Tabs (Citalopram Hydrobromide) .... Take One Tab By Mouth Daily 6)  Clarinex-D 24 Hour 5-240 Mg Xr24h-Tab (Desloratadine-Pseudoephedrine) .... One By Mouth Daily For Allergies 7)  Protonix 40 Mg Tbec (Pantoprazole Sodium) .... One By Mouth Daily 8)  Ventolin Hfa 108 (90 Base) Mcg/act Aers  (Albuterol Sulfate) .... 9 Gm Mdi For $9 2 Puffs Qid As Needed For Wheeze 9)  Percocet 10-325 Mg Tabs (Oxycodone-Acetaminophen) .Marland Kitchen.. 1 By Mouth Q6hr As Needed Severe Pain. 10)  Nitrostat 0.4 Mg Subl (Nitroglycerin) .... One By Mouth As Needed For Chest Pain; May Repeat 2 More Times Every 5 Minutes; If Chest Pain Does Not Resolve After Three Doses, Call 911 11)  Methotrexate 2.5 Mg Tabs (Methotrexate Sodium) .... 8 Tabs By Mouth Every Friday.  Disp 1 Mo Supply 12)  Folic Acid 1 Mg Tabs (Folic Acid) .Marland Kitchen.. 1 By Mouth Once Daily Every Day Except Friday. 13)  Klonopin 0.5 Mg Tabs (Clonazepam) .Marland Kitchen.. 1 Tab By Mouthtid As Needed For Anxiety 14)  Zyrtec Allergy 10 Mg Caps (Cetirizine Hcl) .Marland Kitchen.. 1 Tab At Bedtime 15)  Prednisone 20 Mg Tabs (Prednisone) .... Take 2 Tabs By Mouth Daily For The Next 5 Days 16)  Doxycycline Hyclate 100 Mg Caps (Doxycycline Hyclate) .Marland Kitchen.. 1 By Mouth Bid 17)  Nasonex 50 Mcg/act Susp (Mometasone Furoate) .Marland Kitchen.. 1 Spray Each Nostril Daily. 18)  Ibuprofen 800 Mg Tabs (Ibuprofen) .... Three Times A Day As Needed in Bursts For Severe Pain.  Allergies (  verified): 1)  ! Sulfa  Past History:  Past medical, surgical, family and social histories (including risk factors) reviewed, and no changes noted (except as noted below).  Past Medical History: Reviewed history from 01/08/2010 and no changes required. RA- CCP positive, will be followed at [facility] HLD DM Anxiety Asthma Allergic Rhinitis 2D ECHO 10/99 = EF 55% Negative r/o MI w/u Nov 11, 2022 L rotator cuff tear (old) - diagnosed by MRI 2/11 - steroid injection 08/30/09 negative r/u MI w/u 2/11  Past Surgical History: Reviewed history from 08/23/2009 and no changes required. Appendectomy - 07/05/2004 Bone spur removal left foot - 07/05/2004 Cholecystectomy - 07/05/2004 Colonoscopy-polypectomy - 07/01/1994 Hysterectomy-complete - 07/05/2004 Right carpal tunnel release - 07/05/2004 Right knee surg x2 - 07/05/2004 UGI Series w/ SB followthru-mild GERD -  01/30/1999  Family History: Reviewed history from 12/14/2009 and no changes required. Dad died at 78 2/2 CVA, MI; he also had DM,  Mom died at 38 2/2 complications associated w/ PNA, One brother healthy (41),  One brother with inoperable lung CA (49)-died 11-11-22, One sister with DM (40), One sister with RA (56) treated with plaquenil, prednisone, MTX Three kids all healthy  Social History: Reviewed history from 11/20/2009 and no changes required. 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 On the MAP program. Lives in trailer. Uses wood for heat. Stressful family situation. 61 yo dtr just had a baby 08/30/09. Lives with Morrie Sheldon (oldest grandchild) and her husband. Morrie Sheldon has intellectual disability  Review of Systems       denies fever, chills, nausea, vomiting, diarrhea or constipation   Physical Exam  General:  Alert, very talkative, Eyes:  PERRL, EOMI Mouth:  oropharynx pink, moist; no erythema  Neck:  No deformities, masses, or tenderness noted. Lungs:  normal respiratory effort and normal breath sounds.   Heart:  normal rate and no murmur.   Abdomen:  soft, non-tender, normal bowel sounds, and no distention.   Msk:  much less swollen noo erythema, pt has increase active and passive ROM.  Pt with mmore grip strength as well.  Pulses:  DP and radial pulses 2+ bilaterally  Extremities:  as above   Impression & Recommendations:  Problem # 1:  RHEUMATOID ARTHRITIS (ICD-714.0) Pt to continue MTX for now likely will need change in meds to something like plaquinel but feel this shoudl be evaluated by a rheumatologist and would appreciate their input.  Will see pt again monthly for her narcotics.  Will hope once on a better regimen then can decrease narcotics.  Her updated medication list for this problem includes:    Bayer Childrens Aspirin 81 Mg Chew (Aspirin) .Marland Kitchen...  Take 1 tablet by mouth once a day    Methotrexate 2.5 Mg Tabs (Methotrexate sodium) .Marland Kitchen... 8 tabs by mouth every friday.  disp 1 mo supply    Prednisone 20 Mg Tabs (Prednisone) .Marland Kitchen... Take 2 tabs by mouth daily for the next 5 days    Ibuprofen 800 Mg Tabs (Ibuprofen) .Marland Kitchen... Three times a day as needed in bursts for severe pain.  Orders: [facility]- Est Level  3 ([address])  Complete Medication List: 1)  Bayer Childrens Aspirin 81 Mg Chew (Aspirin) .... Take 1 tablet by mouth once a day 2)  Crestor 10 Mg Tabs (Rosuvastatin calcium) .... Take 1 tablet by mouth at bedtime 3)  Metformin Hcl 1000 Mg Tabs (  Metformin hcl) .... Take 1 tablet by mouth two times a day 4)  Nasacort Aq 55 Mcg/act Aers (Triamcinolone acetonide(nasal)) .... Inhale 2 sprays in each nostril as directed once a day 5)  Celexa 40 Mg Tabs (Citalopram hydrobromide) .... Take one tab by mouth daily 6)  Clarinex-d 24 Hour 5-240 Mg Xr24h-tab (Desloratadine-pseudoephedrine) .... One by mouth daily for allergies 7)  Protonix 40 Mg Tbec (Pantoprazole sodium) .... One by mouth daily 8)  Ventolin Hfa 108 (90 Base) Mcg/act Aers (Albuterol sulfate) .... 9 gm mdi for $9 2 puffs qid as needed for wheeze 9)  Percocet 10-325 Mg Tabs (Oxycodone-acetaminophen) .Marland Kitchen.. 1 by mouth q6hr as needed severe pain. 10)  Nitrostat 0.4 Mg Subl (Nitroglycerin) .... One by mouth as needed for chest pain; may repeat 2 more times every 5 minutes; if chest pain does not resolve after three doses, call 911 11)  Methotrexate 2.5 Mg Tabs (Methotrexate sodium) .... 8 tabs by mouth every friday.  disp 1 mo supply 12)  Folic Acid 1 Mg Tabs (Folic acid) .Marland Kitchen.. 1 by mouth once daily every day except friday. 13)  Klonopin 0.5 Mg Tabs (Clonazepam) .Marland Kitchen.. 1 tab by mouthtid as needed for anxiety 14)  Zyrtec Allergy 10 Mg Caps (Cetirizine hcl) .Marland Kitchen.. 1 tab at bedtime 15)  Prednisone 20 Mg Tabs (Prednisone) .... Take 2 tabs by mouth daily for the next 5 days 16)  Doxycycline Hyclate 100 Mg Caps  (Doxycycline hyclate) .Marland Kitchen.. 1 by mouth bid 17)  Nasonex 50 Mcg/act Susp (Mometasone furoate) .Marland Kitchen.. 1 spray each nostril daily. 18)  Ibuprofen 800 Mg Tabs (Ibuprofen) .... Three times a day as needed in bursts for severe pain.   Orders Added: 1)  [facility]- Est Level  3 [[address]]

## 2010-08-02 NOTE — Progress Notes (Signed)
Summary: meds  Phone Note Call from Patient Call back at [phone]   Caller: Patient Summary of Call: pt was supposed to get something for yeast inf  - pls fax to [facility] Initial call taken by: De Nurse,  July 19, 2010 4:19 PM  Follow-up for Phone Call        can we call pt and tell her that the diflucan is at the pharmacy.  Thanks. Follow-up by: Antoine Primas DO,  July 20, 2010 7:41 AM  Additional Follow-up for Phone Call Additional follow up Details #1::        Pt informed Additional Follow-up by: Jone Baseman CMA,  July 20, 2010 9:40 AM    New/Updated Medications: FLUCONAZOLE 100 MG TABS (FLUCONAZOLE) take 1 tab today then one in four days then asa needed. Prescriptions: FLUCONAZOLE 100 MG TABS (FLUCONAZOLE) take 1 tab today then one in four days then asa needed.  #6 x 0   Entered and Authorized by:   Antoine Primas DO   Signed by:   Antoine Primas DO on 07/20/2010   Method used:   Faxed to ...       [facility] Department (retail)       [address] [place], [place]  [address]       Ph: [phone]       Fax: [phone]   RxID:   [id number]

## 2010-08-02 NOTE — Assessment & Plan Note (Signed)
Summary: arthritis in hand/coming at 11/eo   Vital Signs:  Patient profile:   60 year old female Height:      63 inches Weight:      172.8 pounds BMI:     30.72 Temp:     97.8 degrees F oral Pulse rate:   99 / minute BP sitting:   135 / 84  (left arm) Cuff size:   regular  Vitals Entered By: Garen Grams LPN (July 05, 2010 10:47 AM) CC: arthritis flare in left hand x 2 days Is Patient Diabetic? Yes Did you bring your meter with you today? No Pain Assessment Patient in pain? yes     Location: left hand Intensity: 9   Primary Care Provider:  Antoine Primas DO  CC:  arthritis flare in left hand x 2 days.  History of Present Illness: 1. RA-  .  She has actulally been doing a little better overallexcept last 2 days had swelling of her left hand that is very painful.  Pt unable to hold anything hand and minimal amount of pain even with light touch.  Pt still doing well overall but because she is compensating due to her hand her right side of her bdy is hurting. Pt is on MTX and has noticed her skin is not heeling as well as when she was off it. Pt also states the pain is not any better overall.   2.  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  Pt now has medicaid and is happy.   Pt is taking klonipin three times a day and doing very well with it. Pt would liek to not come down on dose if possible.     3. DM   glucose low:100 Glucose high:65 Last A1C:7.3 today (was 8) Taking Meds:yes On INsulin:no Side effects:no ROS: Denies polyuria, polydipsia, visual changes numbness in extremities, foot ulcers Lifestyle modifications: none Pt was supposed to be referred to an opthomologist but is still having  to wait.  Pt does have [employer] now.    4.Pt is scheduled for colonoscopy at the end of the month.    Habits & Providers  Alcohol-Tobacco-Diet     Tobacco Status: quit     Year Quit: 16  years  Current Medications (verified): 1)  Bayer Childrens Aspirin 81 Mg Chew (Aspirin) .... Take 1 Tablet By Mouth Once A Day 2)  Crestor 10 Mg Tabs (Rosuvastatin Calcium) .... Take 1 Tablet By Mouth At Bedtime 3)  Metformin Hcl 1000 Mg Tabs (Metformin Hcl) .... Take 1 Tablet By Mouth Two Times A Day 4)  Nasacort Aq 55 Mcg/act Aers (Triamcinolone Acetonide(Nasal)) .... Inhale 2 Sprays in Each Nostril As Directed Once A Day 5)  Celexa 40 Mg  Tabs (Citalopram Hydrobromide) .... Take One Tab By Mouth Daily 6)  Clarinex-D 24 Hour 5-240 Mg Xr24h-Tab (Desloratadine-Pseudoephedrine) .... One By Mouth Daily For Allergies 7)  Protonix 40 Mg Tbec (Pantoprazole Sodium) .... One By Mouth Daily 8)  Ventolin Hfa 108 (90 Base) Mcg/act Aers (Albuterol Sulfate) .... 9 Gm Mdi For $9 2 Puffs Qid As Needed For Wheeze 9)  Percocet 10-325 Mg Tabs (Oxycodone-Acetaminophen) .Marland Kitchen.. 1 By Mouth Q6hr As Needed Severe Pain. 10)  Nitrostat 0.4 Mg Subl (Nitroglycerin) .... One By Mouth As Needed For Chest Pain; May Repeat 2 More Times Every 5 Minutes; If Chest Pain Does Not Resolve After Three Doses, Call 911  11)  Methotrexate 2.5 Mg Tabs (Methotrexate Sodium) .... 8 Tabs By Mouth Every Friday.  Disp 1 Mo Supply 12)  Folic Acid 1 Mg Tabs (Folic Acid) .Marland Kitchen.. 1 By Mouth Once Daily Every Day Except Friday. 13)  Klonopin 0.5 Mg Tabs (Clonazepam) .Marland Kitchen.. 1 Tab By Mouthtid As Needed For Anxiety 14)  Zyrtec Allergy 10 Mg Caps (Cetirizine Hcl) .Marland Kitchen.. 1 Tab At Bedtime 15)  Prednisone 20 Mg Tabs (Prednisone) .... Take 2 Tabs By Mouth Daily For The Next 5 Days 16)  Doxycycline Hyclate 100 Mg Caps (Doxycycline Hyclate) .Marland Kitchen.. 1 By Mouth Bid 17)  Nasonex 50 Mcg/act Susp (Mometasone Furoate) .Marland Kitchen.. 1 Spray Each Nostril Daily.  Allergies (verified): 1)  ! Sulfa  Past History:  Past medical, surgical, family and social histories (including risk factors) reviewed, and no changes noted (except as noted below).  Past Medical History: Reviewed  history from 01/08/2010 and no changes required. RA- CCP positive, will be followed at [facility] HLD DM Anxiety Asthma Allergic Rhinitis 2D ECHO 10/99 = EF 55% Negative r/o MI w/u 2022/12/02 L rotator cuff tear (old) - diagnosed by MRI 2/11 - steroid injection 08/30/09 negative r/u MI w/u 2/11  Past Surgical History: Reviewed history from 08/23/2009 and no changes required. Appendectomy - 07/05/2004 Bone spur removal left foot - 07/05/2004 Cholecystectomy - 07/05/2004 Colonoscopy-polypectomy - 07/01/1994 Hysterectomy-complete - 07/05/2004 Right carpal tunnel release - 07/05/2004 Right knee surg x2 - 07/05/2004 UGI Series w/ SB followthru-mild GERD - 01/30/1999  Family History: Reviewed history from 12/14/2009 and no changes required. Dad died at 50 2/2 CVA, MI; he also had DM,  Mom died at 94 2/2 complications associated w/ PNA, One brother healthy (36),  One brother with inoperable lung CA (49)-died 2022/12/02, One sister with DM (31), One sister with RA (56) treated with plaquenil, prednisone, MTX Three kids all healthy  Social History: Reviewed history from 11/20/2009 and no changes required. 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 On the MAP program. Lives in trailer. Uses wood for heat. Stressful family situation. 61 yo dtr just had a baby 08/30/09. Lives with Morrie Sheldon (oldest grandchild) and her husband. Morrie Sheldon has intellectual disability  Review of Systems       see hpi  Physical Exam  General:  VS noted.  Well NAD Eyes:  PERRL, EOMI Ears:  External ear exam shows no significant lesions or deformities.  Otoscopic examination reveals clear canals, tympanic membranes are intact bilaterally without bulging, retraction, inflammation or discharge. Hearing is grossly normal bilaterally. Nose:  External nasal examination shows no deformity or inflammation. Nasal mucosa are erythematous  and moist but without lesions or exudates. Mouth:  oropharynx pink, moist; no erythema  Neck:  No deformities, masses, or tenderness noted. Lungs:  Normal respiratory effort, chest expands symmetrically. Lungs are clear to auscultation, no crackles or wheezes. Heart:  Normal rate and regular rhythm. S1 and S2 normal without gallop, murmur, click, rub or other extra sounds. Abdomen:  soft, non-tender, normal bowel sounds, and no distention.   Msk:  left hand significan t swelling and redness over the left 2nd MP joint extremely tender to palpation no streaking but warm to touch.  Pulses:  DP and radial pulses 2+ bilaterally  Extremities:  as above   Impression & Recommendations:  Problem # 1:  RHEUMATOID ARTHRITIS (ICD-714.0) Pt is having a flare even  with the MTX and appears that the joints are becoming more involved.  We will continue MTX and start prednisone for the flare.  Pt though urged to go see the rheumatolgist in the near future because pt will need to be changed to a different DMARD now.  Pt is maxed out on the MTX.   Do not feel comfortable myself changing and would appreciate the input of a specialist.  Her updated medication list for this problem includes:    Bayer Childrens Aspirin 81 Mg Chew (Aspirin) .Marland Kitchen... Take 1 tablet by mouth once a day    Methotrexate 2.5 Mg Tabs (Methotrexate sodium) .Marland Kitchen... 8 tabs by mouth every friday.  disp 1 mo supply    Prednisone 20 Mg Tabs (Prednisone) .Marland Kitchen... Take 2 tabs by mouth daily for the next 5 days  Orders: [facility]- Est  Level 4 ([address]109)  Problem # 2:  DIABETES MELLITUS II, UNCOMPLICATED (ICD-250.00) Assessment: Improved improved A1c told pt to continue current regimen and likely will be at personel goal of 7 in 3 months.  Her updated medication list for this problem includes:    Bayer Childrens Aspirin 81 Mg Chew (Aspirin) .Marland Kitchen... Take 1 tablet by mouth once a day    Metformin Hcl 1000 Mg Tabs (Metformin hcl) .Marland Kitchen... Take 1 tablet by mouth two  times a day  Orders: A1C-[facility] (60454) Comp Met-[facility] (09811-91478) [facility]- Est  Level 4 (29562)  Problem # 3:  OBESITY, NOS (ICD-278.00) stable  Problem # 4:  ANXIETY (ICD-300.00) continue current treatment. Pt appears to be doing well, ,will decrease dose hopefully at next visit.  Her updated medication list for this problem includes:    Celexa 40 Mg Tabs (Citalopram hydrobromide) .Marland Kitchen... Take one tab by mouth daily    Klonopin 0.5 Mg Tabs (Clonazepam) .Marland Kitchen... 1 tab by mouthtid as needed for anxiety  Orders: [facility]- Est  Level 4 (13086)  Complete Medication List: 1)  Bayer Childrens Aspirin 81 Mg Chew (Aspirin) .... Take 1 tablet by mouth once a day 2)  Crestor 10 Mg Tabs (Rosuvastatin calcium) .... Take 1 tablet by mouth at bedtime 3)  Metformin Hcl 1000 Mg Tabs (Metformin hcl) .... Take 1 tablet by mouth two times a day 4)  Nasacort Aq 55 Mcg/act Aers (Triamcinolone acetonide(nasal)) .... Inhale 2 sprays in each nostril as directed once a day 5)  Celexa 40 Mg Tabs (Citalopram hydrobromide) .... Take one tab by mouth daily 6)  Clarinex-d 24 Hour 5-240 Mg Xr24h-tab (Desloratadine-pseudoephedrine) .... One by mouth daily for allergies 7)  Protonix 40 Mg Tbec (Pantoprazole sodium) .... One by mouth daily 8)  Ventolin Hfa 108 (90 Base) Mcg/act Aers (Albuterol sulfate) .... 9 gm mdi for $9 2 puffs qid as needed for wheeze 9)  Percocet 10-325 Mg Tabs (Oxycodone-acetaminophen) .Marland Kitchen.. 1 by mouth q6hr as needed severe pain. 10)  Nitrostat 0.4 Mg Subl (Nitroglycerin) .... One by mouth as needed for chest pain; may repeat 2 more times every 5 minutes; if chest pain does not resolve after three doses, call 911 11)  Methotrexate 2.5 Mg Tabs (Methotrexate sodium) .... 8 tabs by mouth every friday.  disp 1 mo supply 12)  Folic Acid 1 Mg Tabs (Folic acid) .Marland Kitchen.. 1 by mouth once daily every day except friday. 13)  Klonopin 0.5 Mg Tabs (Clonazepam) .Marland Kitchen.. 1 tab by mouthtid as needed for anxiety 14)  Zyrtec Allergy 10  Mg Caps (Cetirizine hcl) .Marland Kitchen.. 1 tab at bedtime 15)  Prednisone 20 Mg Tabs (Prednisone) .Marland KitchenMarland KitchenMarland Kitchen  Take 2 tabs by mouth daily for the next 5 days 16)  Doxycycline Hyclate 100 Mg Caps (Doxycycline hyclate) .Marland Kitchen.. 1 by mouth bid 17)  Nasonex 50 Mcg/act Susp (Mometasone furoate) .Marland Kitchen.. 1 spray each nostril daily.  Other Orders: CBC w/Diff-[facility] [phone])  Patient Instructions: 1)  Good to see you 2)  We wil do prednosone 3)  I wnat you to take all your other meds and make an appointment with rheumatologist at [facility] soon 4)  I want to see you again in 2 months 5)  I refilled your percocet and klonopin.    Orders Added: 1)  A1C-[facility] [83036] 2)  Comp Met-[facility] [80053-22900] 3)  CBC w/Diff-[facility] [85025] 4)  [facility]- Est  Level 4 [60454]    Laboratory Results   Blood Tests   Date/Time Received: July 05, 2010 11:22 AM  Date/Time Reported: July 05, 2010 11:46 AM   HGBA1C: 7.3%   (Normal Range: Non-Diabetic - 3-6%   Control Diabetic - 6-8%)  Comments: ...............test performed by......Marland KitchenBonnie A. Swazil[place], MLS (ASCP)cm      Prevention & Chronic Care Immunizations   Influenza vaccine: Fluvax 3+  (04/06/2010)   Influenza vaccine due: 06/11/2008    Tetanus booster: 01/03/2005: Done.   Tetanus booster due: 01/04/2015    Pneumococcal vaccine: Not documented    H. zoster vaccine: Not documented  Colorectal Screening   Hemoccult: Not documented   Hemoccult due: Not Indicated    Colonoscopy: normal  (05/07/2006)   Colonoscopy due: 05/08/2011  Other Screening   Pap smear: Not documented   Pap smear due: Not Indicated    Mammogram: ASSESSMENT: Negative - BI-RADS 1^MM DIGITAL SCREENING  (06/14/2010)   Mammogram due: 05/29/2010    DXA bone density scan: Not documented   Smoking status: quit  (07/05/2010)  Diabetes Mellitus   HgbA1C: 7.3  (07/05/2010)   Hemoglobin A1C due: 09/10/2007    Eye exam: Not documented   Diabetic eye exam action/deferral: Ophthalmology referral   (02/06/2010)    Foot exam: yes  (09/15/2009)   High risk foot: Not documented   Foot care education: Not documented   Foot exam due: 07/12/2008    Urine microalbumin/creatinine ratio: Not documented   Urine microalbumin/cr due: 07/12/2008    Diabetes flowsheet reviewed?: Yes   Progress toward A1C goal: Improved    Stage of readiness to change (diabetes management): Action  Lipids   Total Cholesterol: 119  (08/22/2008)   LDL: 58  (08/15/2009)   LDL Direct: 65  (06/12/2007)   HDL: 48  (08/22/2008)   Triglycerides: 126  (08/22/2008)    SGOT (AST): 14  (04/26/2010)   SGPT (ALT): 17  (04/26/2010) CMP ordered    Alkaline phosphatase: 73  (04/26/2010)   Total bilirubin: 0.4  (04/26/2010)  Self-Management Support :   Personal Goals (by the next clinic visit) :     Personal A1C goal: 7  (03/09/2009)     Personal blood pressure goal: 130/80  (03/09/2009)     Personal LDL goal: 70  (03/09/2009)    Diabetes self-management support: Written self-care plan, Referred for DM self-management training  (09/15/2009)    Diabetes self-management support not done because: Not indicated  (08/30/2009)    Lipid self-management support: Written self-care plan  (09/15/2009)     Lipid self-management support not done because: Good outcomes  (11/07/2009)

## 2010-08-02 NOTE — Progress Notes (Signed)
Summary: phone note  Phone Note Call from Patient   Caller: Patient Summary of Call: seen dr. Raul Del and was told to wear tie up shoes to help with support. would like to know what she can do b/c she is experiencing swelling and bruising as a result.   I informed the pt to try and stay off of her foot and elevate it and use ice packs and pain reliever   Follow-up for Phone Call        LM for pt to come back for appointment if pain still worsening.  Follow-up by: Antoine Primas DO,  June 15, 2010 2:05 PM

## 2010-08-02 NOTE — Assessment & Plan Note (Signed)
Summary: arthritis flare per pt/[place]/smith   Vital Signs:  Patient profile:   60 year old female Weight:      173.5 pounds Temp:     98.7 degrees F oral Pulse rate:   80 / minute BP sitting:   138 / 86  (right arm)  Vitals Entered By: Renato Battles slade,cma CC: arthritis flare-up. finished prednisone yesterday. now pain left hand/wrist/elbow with swelling. has upcoming appt at [facility] 07-26-10 Pain Assessment Patient in pain? yes     Location: left hand/arm Intensity: 10 Onset of pain  x1d   Primary Care Provider:  Antoine Primas DO  CC:  arthritis flare-up. finished prednisone yesterday. now pain left hand/wrist/elbow with swelling. has upcoming appt at [facility] 07-26-10.  History of Present Illness: Severe left wrist pain, seen 5 days ago for severe left hand pain assoicated with swelling and inflammation.  She completed her prednisone course last night.  She is out of Percocet and cannot get it filled until tomorrow.  Patient seen with Dr. Mauricio Po, case reviewed.  + CPP with apt with Rheum at [facility] on 07/26/10.  Has been on MTX, burts of steroids (4 in past 6 months), and intermittent NSAIDS (normal renal function in diabetic).    Pain is so bad she cannot sleep,  dress herself, and wants to cry.  Rates at 10/10  Habits & Providers  Alcohol-Tobacco-Diet     Tobacco Status: quit > 6 months  Allergies: 1)  ! Sulfa  Past History:  Past Medical History: Last updated: 01/08/2010 RA- CCP positive, will be followed at [facility] HLD DM Anxiety Asthma Allergic Rhinitis 2D ECHO 10/99 = EF 55% Negative r/o MI w/u 5/06 L rotator cuff tear (old) - diagnosed by MRI 2/11 - steroid injection 08/30/09 negative r/u MI w/u 2/11  Social History: Smoking Status:  quit > 6 months  Physical Exam  General:  Alert, very talkative, guarding left hand/wrist Lungs:  normal respiratory effort and normal breath sounds.   Heart:  normal rate and no murmur.   Msk:  Holding left hand against  stomach; able to move shoulder to 90 degrees with good strenght, full extension and flexion of elbow, unable to move wrist secondary to pain.  Wrist is swollen but non reddened. Skin:  multiple open picking lesions on skin Psych:  anxious   Impression & Recommendations:  Problem # 1:  WRIST PAIN (ICD-719.43)  involving left wirst, discussed case with Dr. Wynona Neat, no signs of septic joint although she is at risk.  She is to call for fever.  Will allow refill on narcotic one day early (new script, she is to bring in old one); 5 more days of prednisone 40 mg, burst of ibuprofen for 3-5 days (on PPI and normal renal funciton)  Orders: [facility]- Est Level  3 (04540)  Problem # 2:  RHEUMATOID ARTHRITIS (ICD-714.0) Apt with speciallist at [facility] 07/26/10 Her updated medication list for this problem includes:    Bayer Childrens Aspirin 81 Mg Chew (Aspirin) .Marland Kitchen... Take 1 tablet by mouth once a day    Methotrexate 2.5 Mg Tabs (Methotrexate sodium) .Marland Kitchen... 8 tabs by mouth every friday.  disp 1 mo supply    Prednisone 20 Mg Tabs (Prednisone) .Marland Kitchen... Take 2 tabs by mouth daily for the next 5 days    Ibuprofen 800 Mg Tabs (Ibuprofen) .Marland Kitchen... Three times a day as needed in bursts for severe pain.  Orders: [facility]- Est Level  3 (98119)  Complete Medication List: 1)  Bayer  Childrens Aspirin 81 Mg Chew (Aspirin) .... Take 1 tablet by mouth once a day 2)  Crestor 10 Mg Tabs (Rosuvastatin calcium) .... Take 1 tablet by mouth at bedtime 3)  Metformin Hcl 1000 Mg Tabs (Metformin hcl) .... Take 1 tablet by mouth two times a day 4)  Nasacort Aq 55 Mcg/act Aers (Triamcinolone acetonide(nasal)) .... Inhale 2 sprays in each nostril as directed once a day 5)  Celexa 40 Mg Tabs (Citalopram hydrobromide) .... Take one tab by mouth daily 6)  Clarinex-d 24 Hour 5-240 Mg Xr24h-tab (Desloratadine-pseudoephedrine) .... One by mouth daily for allergies 7)  Protonix 40 Mg Tbec (Pantoprazole sodium) .... One by mouth daily 8)  Ventolin Hfa  108 (90 Base) Mcg/act Aers (Albuterol sulfate) .... 9 gm mdi for $9 2 puffs qid as needed for wheeze 9)  Percocet 10-325 Mg Tabs (Oxycodone-acetaminophen) .Marland Kitchen.. 1 by mouth q6hr as needed severe pain. 10)  Nitrostat 0.4 Mg Subl (Nitroglycerin) .... One by mouth as needed for chest pain; may repeat 2 more times every 5 minutes; if chest pain does not resolve after three doses, call 911 11)  Methotrexate 2.5 Mg Tabs (Methotrexate sodium) .... 8 tabs by mouth every friday.  disp 1 mo supply 12)  Folic Acid 1 Mg Tabs (Folic acid) .Marland Kitchen.. 1 by mouth once daily every day except friday. 13)  Klonopin 0.5 Mg Tabs (Clonazepam) .Marland Kitchen.. 1 tab by mouthtid as needed for anxiety 14)  Zyrtec Allergy 10 Mg Caps (Cetirizine hcl) .Marland Kitchen.. 1 tab at bedtime 15)  Prednisone 20 Mg Tabs (Prednisone) .... Take 2 tabs by mouth daily for the next 5 days 16)  Doxycycline Hyclate 100 Mg Caps (Doxycycline hyclate) .Marland Kitchen.. 1 by mouth bid 17)  Nasonex 50 Mcg/act Susp (Mometasone furoate) .Marland Kitchen.. 1 spray each nostril daily. 18)  Ibuprofen 800 Mg Tabs (Ibuprofen) .... Three times a day as needed in bursts for severe pain.  Patient Instructions: 1)  We are not sure what is causing this, if you develop fever please return 2)  Prednisone for 5 more days at 40 mg 3)  You  may get your Percocet filled and can bring your old script into Dr. Katrinka Blazing to destroy 4)  You may use ibuprofen 800 mg three times a day for 3-5 days, only use this in bursts. 5)  Quit picking at your skin, use lubricaton, this can introduce infection into your body. 6)  Return to see Dr. Katrinka Blazing next week Prescriptions: IBUPROFEN 800 MG TABS (IBUPROFEN) three times a day as needed in bursts for severe pain. Brand medically necessary #90 x 0   Entered by:   Helane Rima DO   Authorized by:   Zachery Dauer MD   Signed by:   Helane Rima DO on 07/12/2010   Method used:   Print then Give to Patient   RxID:   [id number] PREDNISONE 20 MG TABS (PREDNISONE) take 2 tabs by mouth  daily for the next 5 days Brand medically necessary #10 x 0   Entered by:   Helane Rima DO   Authorized by:   Zachery Dauer MD   Signed by:   Helane Rima DO on 07/12/2010   Method used:   Print then Give to Patient   RxID:   [id number] PERCOCET 10-325 MG TABS (OXYCODONE-ACETAMINOPHEN) 1 by mouth q6hr as needed severe pain. Brand medically necessary #124 x 0   Entered by:   Helane Rima DO   Authorized by:   Zachery Dauer MD   Signed by:  Helane Rima DO on 07/12/2010   Method used:   Print then Give to Patient   RxID:   [id number]    Orders Added: 1)  [facility]- Est Level  3 [13086]

## 2010-08-02 NOTE — Progress Notes (Signed)
Summary: Problem with meds  Phone Note Call from Patient Call back at [phone]   Reason for Call: Talk to Nurse Summary of Call: pt calling re: meds prescribed 12/13, pt needs refills on her inhaler but needs 3 month supply, pharmacy told her they never received it. Also needs refills on  methotrexate, is not  out yet but wants pharmacy to have refills on file.  Initial call taken by: Knox Royalty,  June 27, 2010 8:38 AM    Prescriptions: METHOTREXATE 2.5 MG TABS (METHOTREXATE SODIUM) 8 tabs by mouth every friday.  disp 1 mo supply  #32 x 0   Entered and Authorized by:   Antoine Primas DO   Signed by:   Antoine Primas DO on 06/29/2010   Method used:   Faxed to ...       [facility] Department (retail)       [address] [place], [place]  [address]       Ph: [phone]       Fax: [phone]   RxID:   [id number] VENTOLIN HFA 108 (90 BASE) MCG/ACT AERS (ALBUTEROL SULFATE) 9 GM MDI for $9 2 puffs qid as needed for wheeze  #3 x 3   Entered and Authorized by:   Antoine Primas DO   Signed by:   Antoine Primas DO on 06/29/2010   Method used:   Faxed to ...       [facility] Department (retail)       [address] [place], [place]  [address]       Ph: [phone]       Fax: [phone]   RxID:   [id number]

## 2010-08-03 ENCOUNTER — Encounter: Payer: Self-pay | Admitting: Family Medicine

## 2010-08-06 ENCOUNTER — Telehealth: Payer: Self-pay | Admitting: Family Medicine

## 2010-08-08 ENCOUNTER — Encounter: Payer: Self-pay | Admitting: Family Medicine

## 2010-08-08 NOTE — Consult Note (Signed)
Summary: Colonoscopy- normal repeat in 10  Colonoscopy   Imported By: De Nurse 08/03/2010 12:09:41  _____________________________________________________________________  External Attachment:    Type:   Image     Comment:   External Document

## 2010-08-09 ENCOUNTER — Encounter: Payer: Self-pay | Admitting: Family Medicine

## 2010-08-09 ENCOUNTER — Ambulatory Visit (INDEPENDENT_AMBULATORY_CARE_PROVIDER_SITE_OTHER): Payer: Self-pay | Admitting: Family Medicine

## 2010-08-09 DIAGNOSIS — M75120 Complete rotator cuff tear or rupture of unspecified shoulder, not specified as traumatic: Secondary | ICD-10-CM

## 2010-08-09 DIAGNOSIS — M069 Rheumatoid arthritis, unspecified: Secondary | ICD-10-CM

## 2010-08-09 DIAGNOSIS — E119 Type 2 diabetes mellitus without complications: Secondary | ICD-10-CM

## 2010-08-09 DIAGNOSIS — J984 Other disorders of lung: Secondary | ICD-10-CM | POA: Insufficient documentation

## 2010-08-09 DIAGNOSIS — F411 Generalized anxiety disorder: Secondary | ICD-10-CM

## 2010-08-09 DIAGNOSIS — J449 Chronic obstructive pulmonary disease, unspecified: Secondary | ICD-10-CM

## 2010-08-09 MED ORDER — CLONAZEPAM 0.5 MG PO TABS: 0.5000 mg | ORAL_TABLET | Freq: Three times a day (TID) | ORAL | Status: DC | PRN

## 2010-08-09 MED ORDER — TIOTROPIUM BROMIDE MONOHYDRATE 18 MCG IN CAPS: 18.0000 ug | ORAL_CAPSULE | Freq: Every day | RESPIRATORY_TRACT | Status: DC

## 2010-08-09 MED ORDER — OXYCODONE-ACETAMINOPHEN 10-325 MG PO TABS: 1.0000 | ORAL_TABLET | Freq: Four times a day (QID) | ORAL | Status: DC | PRN

## 2010-08-09 NOTE — Assessment & Plan Note (Signed)
Stable due for A1C in 2 months. No changes at this time.

## 2010-08-09 NOTE — Progress Notes (Signed)
  Subjective:    Patient ID: Michelle [name], female    DOB: April 03, 1951, 60 y.o.   MRN: [id number]  HPI  1. Rheumatoid Arthritis Pt has been struggling with this for sometime, pt has +ccp seen at [facility].  Pt recently saw rheum and they stated that they feel that this could be due to pseudogout. And pulled her off her methotrexate and folic acid.  Pt states since then her hands have gotten worse and her shoulder is really hurting. Pt states the Dr. York Spaniel "your PCP will think I am crazy" but is trying colchicine but she can not afford it (>$300).  Pt is a little nervous about the care she is getting there.   2.  Shoulder pain-  Pt has had hx of a partial rotator cuff tear for some time now and has been medically controlling but now affecting all her activities of daily living.  Pt states she is unable to lift the arm above her head or hold anything in that arm.  Pt did see a ortho surgeon at [facility] and will be having surgery in the near future after she gets an MRI which is Monday of next week. Pt is ready to have surgery and only thing that helps with pain is the percocet.   3.  2. Diabetes:  High at home:200 Low at home:90 Taking medications:yes Side effects:no ROS: denies fever, chills, dizziness, loss of conscieness, polyuria poly dipsia numbness or tingling in extremities or chest pain.  4.  Anxiety-  Pt still has a lot of stressors and now is watching two of her grandchildren which is not what she would like to be doing. Pt states the klonopin really does help and she uses them as needed but seems to be three times a day.    Review of Systems see above     Objective:   Physical Exam     General Appearance:    Alert, cooperative, no distress, appears stated age     Eyes:    PERRL, conjunctiva/corneas clear, EOM's intact,   Ears:    Normal TM's and external ear canals, both ears     Throat:   Lips, mucosa, and tongue normal; teeth and gums normal  Neck:   Supple, symmetrical, trachea  midline, no adenopathy;    thyroid:  no enlargement/tenderness/nodules; no carotid   bruit or JVD     Lungs:     Clear to auscultation bilaterally, respirations unlabored      Heart:    Regular rate and rhythm, S1 and S2 normal, no murmur, rub   or gallop     Abdomen:     Soft, non-tender, bowel sounds active all four quadrants,    no masses, no organomegaly        Extremities:   Left shoulder pain severe decrease active range of motion and passive range of motion no true swelling, no erythema, unable to do full exam due to pain. Hands mildly swollen bilaterally unable to make clinch fist no erythema.   Pulses:   2+ and symmetric all extremities                Assessment & Plan:

## 2010-08-09 NOTE — Assessment & Plan Note (Signed)
Pt is being followed by [facility], read consult note not going evidence based will see how pt does but would be incline if pt not happy to get a second opinion in the next month or so. Refilled percocet for now to help with pain gave enough for 2 months will return then after everything including ortho consult again.

## 2010-08-09 NOTE — Assessment & Plan Note (Signed)
Will refill klonopin for next 2 months would like to wean soon will address at next visit.

## 2010-08-09 NOTE — Assessment & Plan Note (Signed)
Seeing [facility] ortho will follow up and see plan MRI Monday Will need to do PT and will set up once surgery is known.

## 2010-08-13 ENCOUNTER — Ambulatory Visit: Payer: Self-pay | Admitting: Family Medicine

## 2010-08-15 ENCOUNTER — Ambulatory Visit (HOSPITAL_COMMUNITY)
Admission: RE | Admit: 2010-08-15 | Discharge: 2010-08-15 | Disposition: A | Discharge status: Home / Self Care | Payer: Self-pay | Source: Ambulatory Visit | Attending: Family Medicine | Admitting: Family Medicine

## 2010-08-15 DIAGNOSIS — Z139 Encounter for screening, unspecified: Secondary | ICD-10-CM | POA: Insufficient documentation

## 2010-08-15 DIAGNOSIS — IMO0002 Reserved for concepts with insufficient information to code with codable children: Secondary | ICD-10-CM

## 2010-08-16 ENCOUNTER — Telehealth: Payer: Self-pay | Admitting: Family Medicine

## 2010-08-16 ENCOUNTER — Other Ambulatory Visit: Payer: Self-pay | Admitting: Family Medicine

## 2010-08-16 DIAGNOSIS — M069 Rheumatoid arthritis, unspecified: Secondary | ICD-10-CM

## 2010-08-16 MED ORDER — PREDNISONE 20 MG PO TABS: 40.0000 mg | ORAL_TABLET | Freq: Every day | ORAL | Status: DC

## 2010-08-16 NOTE — Telephone Encounter (Signed)
Can we call pt and tell her to contact her rheumatologist.  I will send in more prednisone this time but would like her to please tell her rheumatologist this so they will put her on the correct medicine. Thank you

## 2010-08-16 NOTE — Progress Notes (Signed)
Summary: Rx  Phone Note Call from Patient Call back at [phone]   Summary of Call: pt came in last month for her left hand swelling, today her rt hand is swollen, wants to know if MD will give her prednisone since we are full for appts today? Initial call taken by: Knox Royalty,  August 06, 2010 9:04 AM  Follow-up for Phone Call        yes called pt and got cell phone from husband called ptr states right hand now swollen.  Pt did see rheumatologist at [facility] and they stopped the mtx.  May be doing a new medicine but not right now. Pt also going to see an ortho doc next week for her shoulder, will see her on thursday. Sent in prednisone.  Follow-up by: Antoine Primas DO,  August 06, 2010 1:34 PM    New/Updated Medications: PREDNISONE 20 MG TABS (PREDNISONE) take 2 tabs by mouth daily for the next 7 days [BMN] Prescriptions: PREDNISONE 20 MG TABS (PREDNISONE) take 2 tabs by mouth daily for the next 7 days Brand medically necessary #14 x 0   Entered and Authorized by:   Antoine Primas DO   Signed by:   Antoine Primas DO on 08/06/2010   Method used:   Faxed to ...       [facility] (retail)       [address] [place], [place]  [address]       Ph: [phone]       Fax: [phone]   RxID:   [id number]

## 2010-08-16 NOTE — Telephone Encounter (Signed)
Will forward to MD.  

## 2010-08-16 NOTE — Consult Note (Signed)
Summary: [facility]  [facility]   Imported By: De Nurse 08/07/2010 10:40:25  _____________________________________________________________________  External Attachment:    Type:   Image     Comment:   External Document

## 2010-08-16 NOTE — Progress Notes (Signed)
Phone in meds due to pt rheum flare will do prednisone told to seek advice from her rheumatologist due to the frequent flares.

## 2010-08-17 NOTE — Telephone Encounter (Signed)
Pt informed and agreeable.  

## 2010-08-31 ENCOUNTER — Ambulatory Visit (INDEPENDENT_AMBULATORY_CARE_PROVIDER_SITE_OTHER): Payer: Self-pay | Admitting: Family Medicine

## 2010-08-31 DIAGNOSIS — M25569 Pain in unspecified knee: Secondary | ICD-10-CM

## 2010-08-31 NOTE — Progress Notes (Signed)
Pt left without being seen Michelle [name], [name]

## 2010-09-01 ENCOUNTER — Inpatient Hospital Stay (INDEPENDENT_AMBULATORY_CARE_PROVIDER_SITE_OTHER)
Admission: RE | Admit: 2010-09-01 | Discharge: 2010-09-01 | Disposition: A | Discharge status: Home / Self Care | Payer: Self-pay | Source: Ambulatory Visit | Attending: Family Medicine | Admitting: Family Medicine

## 2010-09-01 DIAGNOSIS — M79609 Pain in unspecified limb: Secondary | ICD-10-CM

## 2010-09-03 NOTE — Progress Notes (Signed)
Pt left without being seen.

## 2010-09-04 ENCOUNTER — Ambulatory Visit: Payer: Self-pay | Admitting: Physical Therapy

## 2010-09-06 ENCOUNTER — Encounter: Payer: Self-pay | Admitting: Family Medicine

## 2010-09-06 ENCOUNTER — Ambulatory Visit (INDEPENDENT_AMBULATORY_CARE_PROVIDER_SITE_OTHER): Payer: Self-pay | Admitting: Family Medicine

## 2010-09-06 DIAGNOSIS — M75120 Complete rotator cuff tear or rupture of unspecified shoulder, not specified as traumatic: Secondary | ICD-10-CM

## 2010-09-06 DIAGNOSIS — M069 Rheumatoid arthritis, unspecified: Secondary | ICD-10-CM

## 2010-09-06 DIAGNOSIS — F411 Generalized anxiety disorder: Secondary | ICD-10-CM

## 2010-09-06 MED ORDER — OXYCODONE-ACETAMINOPHEN 10-325 MG PO TABS: 1.0000 | ORAL_TABLET | Freq: Four times a day (QID) | ORAL | Status: DC | PRN

## 2010-09-06 MED ORDER — CLONAZEPAM 0.5 MG PO TABS: 0.5000 mg | ORAL_TABLET | Freq: Three times a day (TID) | ORAL | Status: DC | PRN

## 2010-09-06 NOTE — Assessment & Plan Note (Signed)
Talked to pt at length about treatment for pt.  Talked about how she is disgruntled with the care she is receiving from her rheumatolgist.  Came to the conclusion pt will try the colchicine when she gets it and then follow up on the 6th of April with the rheumatolgist.  If the pt is still not happy then will get pt to [facility] and see if second opinion would be better.  Pt does have + CCP which makes it interesting that rheumatolgist does not think this is RA.   Shots in shoulder and knee today both left side. Pt had good  Procedure note After verbal and written consent given pt was prepped with betadine.  1:3 kenalog 40 to lidocaine used in left shoulder and left knee.  Pt minimal bleeding dressed with band aid, Pt given red flags to look for pt had better pain control immediatly.

## 2010-09-06 NOTE — Assessment & Plan Note (Signed)
Given steroid injection today, pt is going to start PT tomorrow will have pt follow up in  1 month, pt is seeing [facility] ortho at present moment and will see what they say.

## 2010-09-06 NOTE — Progress Notes (Signed)
Subjective:    Patient ID: Michelle [name], female    DOB: Mar 23, 1951, 60 y.o.   MRN: [id number]  HPI    Review of Systems     Objective:   Physical Exam        Assessment & Plan:   Subjective:    Patient ID: Michelle [name], female    DOB: May 07, 1951, 60 y.o.   MRN: [id number]  HPI  1. Rheumatoid Arthritis Pt has been struggling with this for sometime, pt has +ccp seen at [facility].  Pt still has not been able to afford the colchicine  To tell her before mentioned pseudogout. And pulled her off her methotrexate and folic acid.  Pt is very frustrated with the care she is receiving at [facility] at this time and feels only the physicians here listen to her and help her.  She is considering getting a second opinion but would need a referral because she does not have insurance.  Pt also has been on a taper of prednisone at present time and has been on it for 3 weeks now.  Pt states she has a sister with RA and that she has been on plaquenil and that it has helped tremndously and would be very open to the idea but the rheumatologist does not think that is right for her.   2.  Shoulder pain-  Pt was seen at [facility] ortho as well and got another MRI states that it showed a full thickness tear but that they want her to try physical therapy which we have done. Pt is not excited about that but will try but as of now the pain is so much  She is unable to lift her arm even to hold a glass at this time.   3.  2. Diabetes:  High at home:200 Low at home:90 Taking medications:yes Side effects:no ROS: denies fever, chills, dizziness, loss of conscieness, polyuria poly dipsia numbness or tingling in extremities or chest pain.  4.  Anxiety-  Pt still has a lot of stressors and now is watching two of her grandchildren which is not what she would like to be doing. Pt states the klonopin really does help and she uses them as needed but seems to be three times a day. Pt also states the prednisone is also not helping  her nerves   Review of Systems see above     Objective:   Physical Exam     General Appearance:    Alert, cooperative, no distress, appears stated age     Eyes:    PERRL, conjunctiva/corneas clear, EOM's intact,   Ears:    Normal TM's and external ear canals, both ears     Throat:   Lips, mucosa, and tongue normal; teeth and gums normal  Neck:   Supple, symmetrical, trachea midline, no adenopathy;    thyroid:  no enlargement/tenderness/nodules; no carotid   bruit or JVD     Lungs:     Clear to auscultation bilaterally, respirations unlabored      Heart:    Regular rate and rhythm, S1 and S2 normal, no murmur, rub   or gallop     Abdomen:     Soft, non-tender, bowel sounds active all four quadrants,    no masses, no organomegaly        Extremities:   Left shoulder pain severe decrease active range of motion and passive range of motion no true swelling, no erythema, unable to do full exam due to pain.  Left knee also hurting negative anterior posterior drawer test mild crepitus with movement minimal joint space, + medial and lateral joint line tenderness. More medially.    Pulses:   2+ and symmetric all extremities                Assessment & Plan:

## 2010-09-06 NOTE — Assessment & Plan Note (Signed)
Still very anxious and the chronic steroids is not helping. Will give klonopin.

## 2010-09-12 ENCOUNTER — Ambulatory Visit: Payer: Self-pay | Attending: Orthopedic Surgery | Admitting: Physical Therapy

## 2010-09-12 DIAGNOSIS — R293 Abnormal posture: Secondary | ICD-10-CM | POA: Insufficient documentation

## 2010-09-12 DIAGNOSIS — IMO0001 Reserved for inherently not codable concepts without codable children: Secondary | ICD-10-CM | POA: Insufficient documentation

## 2010-09-12 DIAGNOSIS — M25619 Stiffness of unspecified shoulder, not elsewhere classified: Secondary | ICD-10-CM | POA: Insufficient documentation

## 2010-09-12 DIAGNOSIS — M6281 Muscle weakness (generalized): Secondary | ICD-10-CM | POA: Insufficient documentation

## 2010-09-12 DIAGNOSIS — M25519 Pain in unspecified shoulder: Secondary | ICD-10-CM | POA: Insufficient documentation

## 2010-09-17 ENCOUNTER — Encounter: Payer: Self-pay | Admitting: Sports Medicine

## 2010-09-17 ENCOUNTER — Ambulatory Visit
Admission: RE | Admit: 2010-09-17 | Discharge: 2010-09-17 | Disposition: A | Discharge status: Home / Self Care | Payer: No Typology Code available for payment source | Source: Ambulatory Visit | Attending: Family Medicine | Admitting: Family Medicine

## 2010-09-17 ENCOUNTER — Ambulatory Visit (INDEPENDENT_AMBULATORY_CARE_PROVIDER_SITE_OTHER): Payer: No Typology Code available for payment source | Admitting: Sports Medicine

## 2010-09-17 VITALS — BP 150/87 | HR 79 | Temp 98.2°F | Ht 63.0 in | Wt 169.9 lb

## 2010-09-17 DIAGNOSIS — M79675 Pain in left toe(s): Secondary | ICD-10-CM | POA: Insufficient documentation

## 2010-09-17 DIAGNOSIS — M79609 Pain in unspecified limb: Secondary | ICD-10-CM

## 2010-09-17 LAB — BASIC METABOLIC PANEL
BUN: 8 mg/dL (ref 6–23)
CO2: 29 mEq/L (ref 19–32)
Chloride: 102 mEq/L (ref 96–112)
GFR calc Af Amer: >60 mL/min (ref >60)
Potassium: 3.7 mEq/L (ref 3.5–5.1)

## 2010-09-17 LAB — DIFFERENTIAL
Eosinophils Absolute: 0.2 10*3/uL (ref 0.0–0.7)
Eosinophils Relative: 2 % (ref 0–5)
Lymphs Abs: 2.4 10*3/uL (ref 0.7–4.0)
Monocytes Relative: 6 % (ref 3–12)

## 2010-09-17 LAB — CBC
HCT: 36.8 % (ref 36.0–46.0)
MCHC: 34.5 g/dL (ref 30.0–36.0)
MCV: 92.1 fL (ref 78.0–100.0)
RBC: 4 MIL/uL (ref 3.87–5.11)
WBC: 11 10*3/uL — ABNORMAL HIGH (ref 4.0–10.5)

## 2010-09-17 MED ORDER — ACETAMINOPHEN ER 650 MG PO TBCR: 1300.0000 mg | EXTENDED_RELEASE_TABLET | Freq: Two times a day (BID) | ORAL | Status: AC

## 2010-09-17 MED ORDER — POST-OP SHOE/SOFT TOP WOMEN MISC: 1.0000 [IU] | Freq: Every day | Status: DC

## 2010-09-17 NOTE — Progress Notes (Signed)
  Subjective:    Patient ID: Michelle [name], female    DOB: 07-Apr-1951, 60 y.o.   MRN: [id number]  HPI Tripped and bent toe back yesterday.  Immediate pain and swelling and bruising. I sent her for XR and she returns for further mgt.   Review of Systems    See HPI Objective:   Physical Exam  Constitutional: She appears well-developed and well-nourished. No distress.  Musculoskeletal:       L Ankle: Erythema, bruising, and mod swelling over 1st IP joint. ROM limited by pain. Strength is limited by pain. Stable lateral and medial ligaments; squeeze test and kleiger test unremarkable;  Talar dome nontender; No pain at base of 5th MT; No tenderness over cuboid; No tenderness over N spot or navicular prominence No tenderness on posterior aspects of lateral and medial malleolus No sign of peroneal tendon subluxations or tenderness to palpation Negative tarsal tunnel tinel's Able to limp 4 steps.     Skin: She is not diaphoretic.   XRays ordered:     Note fx at lateral aspect of base of distal phalanx.  Fx line extends just to lateral joint surface but alignment is normal, non-displaced.    Assessment & Plan:

## 2010-09-17 NOTE — Patient Instructions (Signed)
Great to meet you, Acetaminophen 1,300mg  2x a day for pain. Wear the post-op shoe. Checking xrays of your toe. See me after Xrays are done. Dr. Karie Schwalbe

## 2010-09-17 NOTE — Assessment & Plan Note (Addendum)
Confirmed non-displaced base of 1st distal phalanx fracture. Post op shoe ordered but pt returns and unable to afford this.   Also cannot afford cam walker.   Fashioned a custom plaster splint that will hold the entire great toe still. Wrapped with ACE. Acetaminophen 1,300 BID. RTC 2-3 weeks to reassess.

## 2010-09-18 ENCOUNTER — Ambulatory Visit: Payer: Self-pay | Admitting: Physical Therapy

## 2010-09-19 LAB — DIFFERENTIAL
Basophils Relative: 0 % (ref 0–1)
Eosinophils Absolute: 0.2 10*3/uL (ref 0.0–0.7)
Monocytes Relative: 8 % (ref 3–12)
Neutro Abs: 6.2 10*3/uL (ref 1.7–7.7)
Neutrophils Relative %: 65 % (ref 43–77)

## 2010-09-19 LAB — BASIC METABOLIC PANEL
BUN: 6 mg/dL (ref 6–23)
Calcium: 8.8 mg/dL (ref 8.4–10.5)
Chloride: 101 mEq/L (ref 96–112)
GFR calc Af Amer: >60 mL/min (ref >60)
GFR calc non Af Amer: >60 mL/min (ref >60)
Potassium: 3.7 mEq/L (ref 3.5–5.1)
Sodium: 132 mEq/L — ABNORMAL LOW (ref 135–145)

## 2010-09-19 LAB — CBC
HCT: 38 % (ref 36.0–46.0)
Hemoglobin: 12.5 g/dL (ref 12.0–15.0)
MCHC: 34.3 g/dL (ref 30.0–36.0)
MCV: 92.8 fL (ref 78.0–100.0)
Platelets: 235 10*3/uL (ref 150–400)
Platelets: 246 10*3/uL (ref 150–400)
RBC: 3.8 MIL/uL — ABNORMAL LOW (ref 3.87–5.11)
RBC: 3.97 MIL/uL (ref 3.87–5.11)
RDW: 13.2 % (ref 11.5–15.5)
WBC: 10 10*3/uL (ref 4.0–10.5)
WBC: 14.8 10*3/uL — ABNORMAL HIGH (ref 4.0–10.5)
WBC: 9.5 10*3/uL (ref 4.0–10.5)

## 2010-09-19 LAB — CARDIAC PANEL(CRET KIN+CKTOT+MB+TROPI)
CK, MB: 0.6 ng/mL (ref 0.3–4.0)
CK, MB: 0.9 ng/mL (ref 0.3–4.0)
Relative Index: INVALID (ref 0.0–2.5)
Relative Index: INVALID (ref 0.0–2.5)
Total CK: 57 U/L (ref 7–177)
Total CK: 68 U/L (ref 7–177)
Troponin I: <0.01 ng/mL (ref 0.00–0.06)

## 2010-09-19 LAB — GLUCOSE, CAPILLARY
Glucose-Capillary: 145 mg/dL — ABNORMAL HIGH (ref 70–99)
Glucose-Capillary: 161 mg/dL — ABNORMAL HIGH (ref 70–99)
Glucose-Capillary: 189 mg/dL — ABNORMAL HIGH (ref 70–99)

## 2010-09-19 LAB — TSH: TSH: 1.481 u[IU]/mL (ref 0.350–4.500)

## 2010-09-20 ENCOUNTER — Ambulatory Visit: Payer: Self-pay | Admitting: Physical Therapy

## 2010-09-25 ENCOUNTER — Encounter: Payer: No Typology Code available for payment source | Admitting: Rehabilitative and Restorative Service Providers"

## 2010-09-25 ENCOUNTER — Ambulatory Visit: Payer: Self-pay | Admitting: Physical Therapy

## 2010-09-27 ENCOUNTER — Encounter: Payer: Self-pay | Admitting: Physical Therapy

## 2010-10-01 ENCOUNTER — Encounter: Payer: Self-pay | Admitting: Family Medicine

## 2010-10-01 ENCOUNTER — Ambulatory Visit (INDEPENDENT_AMBULATORY_CARE_PROVIDER_SITE_OTHER): Payer: No Typology Code available for payment source | Admitting: Family Medicine

## 2010-10-01 DIAGNOSIS — M75120 Complete rotator cuff tear or rupture of unspecified shoulder, not specified as traumatic: Secondary | ICD-10-CM

## 2010-10-01 DIAGNOSIS — E119 Type 2 diabetes mellitus without complications: Secondary | ICD-10-CM

## 2010-10-01 DIAGNOSIS — M069 Rheumatoid arthritis, unspecified: Secondary | ICD-10-CM

## 2010-10-01 DIAGNOSIS — F411 Generalized anxiety disorder: Secondary | ICD-10-CM

## 2010-10-01 LAB — POCT GLYCOSYLATED HEMOGLOBIN (HGB A1C): Hemoglobin A1C: 9.2

## 2010-10-01 MED ORDER — AZITHROMYCIN 500 MG PO TABS: 500.0000 mg | ORAL_TABLET | Freq: Every day | ORAL | Status: AC

## 2010-10-01 MED ORDER — CLONAZEPAM 0.5 MG PO TABS: 0.5000 mg | ORAL_TABLET | Freq: Three times a day (TID) | ORAL | Status: DC | PRN

## 2010-10-01 MED ORDER — OXYCODONE-ACETAMINOPHEN 10-325 MG PO TABS: 1.0000 | ORAL_TABLET | Freq: Four times a day (QID) | ORAL | Status: DC | PRN

## 2010-10-01 MED ORDER — GLIMEPIRIDE 2 MG PO TABS: 2.0000 mg | ORAL_TABLET | ORAL | Status: DC

## 2010-10-01 NOTE — Progress Notes (Signed)
  Subjective:    Patient ID: Michelle [name], female    DOB: 1950-12-11, 60 y.o.   MRN: [id number]  Arthritis   Review of Systems  Musculoskeletal: Positive for arthritis.   Objective:   Physical Exam  Assessment & Plan:   Subjective:    Patient ID: Michelle [name], female    DOB: [birth year], 60 y.o.   MRN: [id number]  HPI  1. Rheumatoid Arthritis Pt has been doing a little better overall pt has taken the colchicine prn and it has made a difference, the pain though is still only under control when she has the percocet.  Pt has not been able to decrease the percocet amount at this time.  Pt has been able to do most activities of daily living without much problems and has felt better over the coarse of the last month then she has in some time.  Pt is scheduled to see the [facility] rheum tomorrow.    2.  Shoulder pain- Pt has started PT and has some increase in motion and movement from time to time she still has some pain with certain movements and cannot wear her purse on that arm but overall is doing better.   3. Diabetes:  High at home:250 Low at home:90 Taking medications:yes Side effects:no ROS: denies fever, chills, dizziness, loss of conscieness, polyuria poly dipsia numbness or tingling in extremities or chest pain. Last A1C 9.2 today pt has been on chronic steroids of 10mg  and states not been watching her dit as before.   4.  Anxiety-  Pt still has a lot of stressors and now is watching two of her grandchildren which is not what she would like to be doing. Pt states the klonopin really does help and she uses them as needed but seems to be three times a day. Pt also states the prednisone is also not helping her nerves   Review of Systems see above     Objective:   Physical Exam     General Appearance:    Alert, cooperative, no distress, appears stated age     Eyes:    PERRL, conjunctiva/corneas clear, EOM's intact,   Ears:    Normal TM's and external ear canals, both ears    Throat:   Lips, mucosa, and tongue normal; teeth and gums normal  Neck:   Supple, symmetrical, trachea midline, no adenopathy;    thyroid:    Lungs:     Clear to auscultation bilaterally, respirations unlabored   Heart:    Regular rate and rhythm, S1 and S2 normal, no murmur, rub   or gallop  Abdomen:     Soft, non-tender, bowel sounds active all four quadrants,    no masses, no organomegaly  Extremities:   Left shoulder pain severe decrease active range of motion and  Improved passive range of motion no true swelling, no erythema,improved overall from last visit.     Pulses:   2+ and symmetric all extremities                Assessment & Plan:

## 2010-10-01 NOTE — Assessment & Plan Note (Signed)
Will have pt follow up with [facility].  Will see what there plan entails. Pt given another prescription for percocet at this time for 2 months.  Will have pt back in 2 months will attempt to possibly decrease at next visit.

## 2010-10-01 NOTE — Assessment & Plan Note (Signed)
Still needs klonopin at this time will need to decrease slowly will attempt to in 2 months. Refilled for the next 2 months.

## 2010-10-01 NOTE — Assessment & Plan Note (Signed)
Pt following up with Dr. @ [facility], may need surgery PT has helped some but still unabl to do all activities of daily living without impairment.

## 2010-10-01 NOTE — Patient Instructions (Signed)
Follow up with your other physicians I am giving you prescriptions for 2 months I am so glad you are doing better Continue using the Spirva, I think it is helping your lungs I am giving you a medication to use if the sinuses get worse. I want to see you again in 2 months I will call you with the results of your A1C.  It may be higher due to the prednisone.

## 2010-10-01 NOTE — Assessment & Plan Note (Signed)
Deteriorated.  Will begin Amaryl at low dose and see if we can get improvement.  Gave pt red flags to look out for and when to call, encouraged to keep log and to watch diet a little more closely.

## 2010-10-03 ENCOUNTER — Telehealth: Payer: Self-pay | Admitting: Family Medicine

## 2010-10-03 ENCOUNTER — Ambulatory Visit: Payer: Self-pay | Attending: Family Medicine | Admitting: Physical Therapy

## 2010-10-03 DIAGNOSIS — M25619 Stiffness of unspecified shoulder, not elsewhere classified: Secondary | ICD-10-CM | POA: Insufficient documentation

## 2010-10-03 DIAGNOSIS — M6281 Muscle weakness (generalized): Secondary | ICD-10-CM | POA: Insufficient documentation

## 2010-10-03 DIAGNOSIS — R293 Abnormal posture: Secondary | ICD-10-CM | POA: Insufficient documentation

## 2010-10-03 DIAGNOSIS — IMO0001 Reserved for inherently not codable concepts without codable children: Secondary | ICD-10-CM | POA: Insufficient documentation

## 2010-10-03 DIAGNOSIS — M25519 Pain in unspecified shoulder: Secondary | ICD-10-CM | POA: Insufficient documentation

## 2010-10-03 NOTE — Telephone Encounter (Signed)
Dr Hortencia Conradi is taking her off Prednisone.  He is worried that this may drop sugar too low .  She has not started the new meds you were putting her on.  She needs to know if you still want her to take it or not.  Please give her a call.

## 2010-10-03 NOTE — Telephone Encounter (Signed)
Called pt back and pt was on prednisone 10mg  daily for sometime, is decreasing to 5mg  daily for the next week then off.  Told pt likely at that small dose should not cause her blood sugar to be that elevated told her I would still like her to try the medication and gave her warnings for sign of hypoglycemic. If we start goiag low we will use the medicine only for sugars greater than 180 in the AM.  Pt is in agreement. Pt does not need to see her rheumatologist again for 4 months and ortho will do surgery when she can no longer take the pain.  Pt is going to try to hold out.

## 2010-10-09 ENCOUNTER — Telehealth: Payer: Self-pay | Admitting: Family Medicine

## 2010-10-09 NOTE — Telephone Encounter (Signed)
New BS meds drops it to 87 and makes her feel really bad.  Stopped taking it Friday - now running in the 110-120's.  Wants to talk to Dr Katrinka Blazing

## 2010-10-09 NOTE — Telephone Encounter (Signed)
Dicussed with pt that she was getting her sugars a little lower in the 80's and she is symptomatic.  Pt is stopping her prednisone today, pt is taking the colchicine, which helps.  Told her with the amyryl to take 1/2 tab if blood sugar is over 180 or full tablet if over 200, otherwise hold.

## 2010-10-25 ENCOUNTER — Telehealth: Payer: Self-pay | Admitting: *Deleted

## 2010-10-25 NOTE — Telephone Encounter (Addendum)
PA required for Crestor.  Form placed in Dr. Michaelle Copas box.

## 2010-10-26 NOTE — Telephone Encounter (Signed)
Filled out and given back to lynn.

## 2010-10-26 NOTE — Telephone Encounter (Signed)
Faxed to medicaid

## 2010-10-29 NOTE — Telephone Encounter (Addendum)
Pharmacy notified that RX has been approved.

## 2010-10-31 ENCOUNTER — Encounter: Payer: Self-pay | Admitting: Family Medicine

## 2010-11-06 ENCOUNTER — Ambulatory Visit (INDEPENDENT_AMBULATORY_CARE_PROVIDER_SITE_OTHER): Payer: Medicaid Other | Admitting: Family Medicine

## 2010-11-06 ENCOUNTER — Encounter: Payer: Self-pay | Admitting: Family Medicine

## 2010-11-06 VITALS — BP 134/84 | HR 66 | Temp 98.1°F | Wt 172.0 lb

## 2010-11-06 DIAGNOSIS — M75120 Complete rotator cuff tear or rupture of unspecified shoulder, not specified as traumatic: Secondary | ICD-10-CM

## 2010-11-06 DIAGNOSIS — F411 Generalized anxiety disorder: Secondary | ICD-10-CM

## 2010-11-06 DIAGNOSIS — M069 Rheumatoid arthritis, unspecified: Secondary | ICD-10-CM

## 2010-11-06 DIAGNOSIS — E119 Type 2 diabetes mellitus without complications: Secondary | ICD-10-CM

## 2010-11-06 MED ORDER — PHENYLEPH-CHLORPHEN-CODEINE 5-2-10 MG/5ML PO SYRP: 5.0000 mL | ORAL_SOLUTION | Freq: Three times a day (TID) | ORAL | Status: DC

## 2010-11-06 MED ORDER — GLUCOSE BLOOD VI STRP: ORAL_STRIP | Status: DC

## 2010-11-06 MED ORDER — ESOMEPRAZOLE MAGNESIUM 40 MG PO CPDR: 40.0000 mg | DELAYED_RELEASE_CAPSULE | Freq: Every day | ORAL | Status: DC

## 2010-11-06 MED ORDER — KETOROLAC TROMETHAMINE 30 MG/ML IJ SOLN
30.0000 mg | Freq: Once | INTRAMUSCULAR | Status: AC
  Administered 2010-11-06: 30 mg via INTRAMUSCULAR

## 2010-11-06 NOTE — Assessment & Plan Note (Signed)
Discussed that should try to take the amaryl to keep sugars below 200.  Pt once again told if wake up and blood sugar more than 160 take 1/2 tab if > 200 take full tab.  Pt voiced understanding.

## 2010-11-06 NOTE — Assessment & Plan Note (Signed)
Pt is getting surgery, will see her after surgery.

## 2010-11-06 NOTE — Assessment & Plan Note (Signed)
Pt will come back in July to reevaluate has prescriptions until then.

## 2010-11-06 NOTE — Assessment & Plan Note (Signed)
Pt in pain today given toradol, will continue prednisone and pt on plaqunil hope it will help.  Gave pt red flags to look out for and when to seek medical attention.

## 2010-11-06 NOTE — Progress Notes (Signed)
  Subjective:    Patient ID: Michelle [name], female    DOB: Oct 25, 1950, 60 y.o.   MRN: [id number]  HPI  Pt is here for a rheum arthritis flare.  Pt states she was seen by [facility] they took her off the colchicine because it was not helping and placed on prednisone and plaquenil.  Pt has only started this for one day, has not seen much improvement.  Pt is going to try to see if it helps.  Finding it hard to do some different activities of daily living without pain, mostly the pain is in the feet and the hands.  Pt has been taking her percocets but still having more pain then usual.  Pt would like to know what else she can do.   Pt was still able to drive here and is planning on cooking dinner.   Pt shoulder pain has gotten worse as well and she is having surgery on May 25th.  Pt is having it at [facility].   2. Diabetes:  High at home:200 Low at home:120 Taking medications:not the amaryl because it was making her feel funny.  Was given a sliding scale on when to use med and has not.  Side effects:as above.  ROS: denies fever, chills, dizziness, loss of conscieness, polyuria poly dipsia numbness or tingling in extremities or chest pain.  3. Anxiety-  Pt on klonopin, still needs it especially with all the pain and no less responsibility.   Pt has refills on percocet and klonopin through June.     Review of Systems    Denies fever, chills, nausea vomiting abdominal pain, dysuria, chest pain, shortness of breath dyspnea on exertion or numbness in extremities  Past medical history, social, surgical and family history all reviewed.   Objective:   Physical Exam     General Appearance:    Alert, cooperative, no distress, appears stated age   Eyes:    PERRL, conjunctiva/corneas clear, EOM's intact,  Ears:    Normal TM's and external ear canals, both ears Throat:   Lips, mucosa, and tongue normal; teeth and gums normal Neck:   Supple, symmetrical, trachea midline, no adenopathy;  thyroid:   Lungs:     Clear to auscultation bilaterally, respirations unlabored  Heart:   Regular rate and rhythm, S1 and S2 normal, no murmur, rub  or gallop Abdomen:     Soft, non-tender, bowel sounds active all four quadrants,   no masses, no organomegaly Extremities:   Left shoulder pain severe decrease active range of motion and  Improved passive range of motion no true swelling, no erythema,improved overall from last visit.    Pulses:   2+ and symmetric all extremities              Assessment & Plan:

## 2010-11-09 ENCOUNTER — Telehealth: Payer: Self-pay | Admitting: *Deleted

## 2010-11-09 NOTE — Telephone Encounter (Signed)
Patient was prescribed Phenyleph-Chlorphen-Codeine at her last OV.  Received a call from pharmacy stating that they did not have that medication.  They did not have a suitable substitute so just told him to give her Guaifenesin with Codeine.  I will route this to PCP and he desires to change he will call them back.

## 2010-11-16 NOTE — Discharge Summary (Signed)
[name], [name]              ACCOUNT NO.:  [id number]   MEDICAL RECORD NO.:  [id number]          PATIENT TYPE:  INP   LOCATION:  4704                         FACILITY:  [facility]   PHYSICIAN:  Leighton Roach McDiarmid, M.D.DATE OF BIRTH:  03-07-1951   DATE OF ADMISSION:  11/05/2004  DATE OF DISCHARGE:  11/06/2004                                 DISCHARGE SUMMARY   MEDICATIONS AT DISCHARGE:  1.  Celexa 20 mg to take one tablet daily before bedtime.  2. Crestor 10 mg      to take one tablet daily.  3. Glucophage 500 mg to take one tablet twice      a day.  4. Ibuprofen 400 mg every 6 hours as needed.  5. Aspirin 81 mg      to take one tablet daily.   DIET:  ADA modified carbohydrate diet.   ACTIVITY:  No restrictions.   DIAGNOSES AT DISCHARGE:  1.  Musculoskeletal pain.  2. Diabetes mellitus, type 2.  3. Hyperlipidemia.      4. _________ medial epicondylitis.   BRIEF SUMMARY:  This is a 60 year old white female patient who, today at  ___________ who on 3/8, around 2 p.m., experienced very sharp pain in the  back of her head that radiated to her neck, left shoulder, down to her  chest, then moved to her left thigh, jaw, and left shoulder.  Pain was sharp  in quality and relieved by itself in the previous two hours.  The patient  did not have any previous history of cardiac problems, did not have any  diaphoresis, no shortness of breath.  No nausea, vomiting, or palpitations  during this episode.  Also, per history, patient __________ over the weekend  prior to admission.   HOSPITAL COURSE:  Problem 1. Atypical chest pain with a history of pain as  above and cardiac panel was within normal limits, ECG with normal sinus  rhythm, and no previous history of cardiac problems.  This was  musculoskeletal in origin.  The patient will be started on aspirin 81 mg  p.o. daily and, after 24 hours course, pain decreased to just a dull  sensation without any medication.  She will be discharged home  with a follow-  up appointment with a date in a week, and she may require a stress test.  Appointment for this will be arranged by the __________ clinic stress test.   Problem 2. Diabetes mellitus.  The patient had diabetes control with  Glucophage 500 mg p.o. __________, follow an ADA, modified carbohydrate  diet.   Problem 3. Hyperlipidemia.  The patient was on Crestor before admission.  At  discharge, continue with same medication and dosage.   Problem 4. __________ medial epicondylitis already on treatment with  Ibuprofen and ice pack, and patient continues with the same symptoms during  hospitalization.   CONSULTS:  None.   PROCEDURES:  None.      IM/MEDQ  D:  11/06/2004  T:  11/06/2004  Job:  [id number]   cc:   Franklyn Lor, MD  Fax: [phone]

## 2010-11-16 NOTE — H&P (Signed)
Michelle [name], Michelle [name]              ACCOUNT NO.:  [id number]   MEDICAL RECORD NO.:  [id number]          PATIENT TYPE:  INP   LOCATION:  4704                         FACILITY:  [facility]   PHYSICIAN:  Leighton Roach McDiarmid, M.D.DATE OF BIRTH:  1951/04/21   DATE OF ADMISSION:  11/05/2004  DATE OF DISCHARGE:                                HISTORY & PHYSICAL   CHIEF COMPLAINT:  Chest pain.   HISTORY OF PRESENT ILLNESS:  This is a 60 year old white female with a  previous history of diabetes mellitus type 2 and hyperlipidemia who refers  today around 2:15 in the afternoon presented with pain __________  quality  on the back of her head that radiates to her neck, left shoulder, down into  her chest on the left side and then moves to her left jaw and left shoulder.  The pain was 10/10 in intensity and it usually backs off during the next 2  hours, apparent while she was peeling potatoes.  She does not reveal any  diaphoresis, no shortness of breath, no nausea, no vomiting, no other  symptoms, no palpitations.   REVIEW OF SYSTEMS:  CONSTITUTIONAL:  Obesity.  CARDIOVASCULAR:  No  palpitations.  No shortness of breath.  History of dyspnea on exertion.  RESPIRATORY:  None.  GASTROINTESTINAL:  __________  no blood, mucous, no  __________ , no nausea or vomiting.  MUSCULOSKELETAL:  __________ .   PAST MEDICAL HISTORY:  1.  Diabetes mellitus type 2 uncomplicated with hemoglobin A1C less than 7      in March of 2006.  2.  Hyperlipidemia.  3.  __________ .  4.  Obesity.  5.  Allergic rhinitis.  6.  Medial epicondylitis since March of 2006.   MEDICATIONS:  1.  Allegra 60 mg p.o. daily.  2.  Celexa 20 mg p.o. daily.  3.  Crestor 10 mg p.o. daily.  4.  Glucophage 500 mg p.o. b.i.d.   PROCEDURE:  The patient had two-dimensional echocardiogram done in October  of 1999 that shows an ejection fraction of 55%.  Also is status post  appendectomy and cholecystectomy, hysterectomy, prior knee surgery x2,  right  carpal tunnel release, and __________  removal from the left foot.  This is  remote history, cannot precise which year.  Upper GI series with bowel  follow through shows mild GERD.  Hyperkeratotic lesions on the face and  __________  benign in nature.   ALLERGIES:  SULFA DRUGS, VICODIN.   FAMILY HISTORY:  Mother died at 40 years old secondary to complications with  __________ .  Father died at 74 years old secondary to cerebrovascular  accident and myocardial infarction, also had diabetes.  One sister with  diabetes mellitus and a sister with rheumatoid arthritis.  One brother with  lung cancer.  __________ .  She has three kids and they are all healthy.  She has been married since age 64 with her husband.  Used to smoke 2-pack-  year history, quit 11 years ago.  There is no alcohol consumption.  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   PHYSICAL EXAMINATION:  VITAL SIGNS:  Temperature 98.9, heart rate 89,  respiratory rate 20, blood pressure 160/95, O2 saturation 97% on 2 liters.  GENERAL:  No acute distress.  HEENT:  Moist mucous membranes.  Pupils equal, round, and reactive to light.  Intact extraocular movements.  External ears and nose within normal limits.  The patient has good dentition, no prosthesis.  Oropharynx is within normal  limits.  Tympanic membranes is within normal limits.  NECK:  Supple.  No masses.  Thyroid is not palpable.  No tenderness and no  adenopathy.  HEART:  There is mild tenderness to palpation at the level of __________  in  the midline on the left side.  Palpation __________  PMI in the left  midclavicular line, sixth intercostal space.  No thrills, no active  precordia on auscultation.  RRR.  No murmurs, rubs, or gallops.  Capillary  refill is 2 seconds.  Peripheral pulses palpable.  No carotid bruits.  EXTREMITIES:  No edema, no cyanosis.  Does __________ .  LUNGS:  Good respiratory effort,  no distress.  Isolated bibasilar crackles.  No wheezes or rhonchi.  MUSCULOSKELETAL:  Inspect of digits, capillary refill less than 2 seconds.  __________  skin on toes with no lesions.  ABDOMEN:  Positive bowel sounds, nontender, nondistended, no  hepatosplenomegaly.  SKIN:  Pale, frail, no lesions.  Hyperpigmented spots on legs and back.  NEUROLOGY:  Alert and oriented x3.  Intact cranial nerves.  Intact  sensation.  Muscle tone and strength 5/5 four extremities.  Deep tendon  reflexes 2+.   LABORATORY DATA:  Cardiac panel at 1551; CK-MB less than 1, troponin I less  than 0.05, myoglobin 60.9, PTT 25, PT 11.5.  Sodium 138, potassium 3.8,  chloride 107, CO2 22, BUN 11, creatinine 0.6, glucose 118, white blood cell  count 9.8, hemoglobin 13.1, hematocrit 38, platelets 172.  AST 16, ALT 15,  alkaline phosphatase 67, total bilirubin 0.5, total protein 6.8, albumin  3.8, calcium 9.5.   Chest x-ray shows no active disease.   ASSESSMENT:  A 60 year old white female with history of diabetes mellitus  type 2 diagnosed in March of 2006, hyperlipidemia, remote history of asthma,  GERD, and anxiety admitted with atypical chest pain.   Problem 1.  Chest pain.  __________  of angina without __________  presentation.  __________  diagnosis myocardial infarction.  __________  was  relieved with medication.  There was no shortness of breath, no diaphoresis,  with normal vital signs.  Cardiac enzymes within normal limits.  EKG normal.  Pulmonary embolism, no history of previous DVT or pulmonary embolism.  No  cough pain, nor edema.  NO recent surgery or __________ . Pneumonia.  No  history of cough, no fever, no shortness of breath.  Chest x-ray within  normal limits.  CBC within normal limits.  Chest wall pain, no history of  trauma.  Chronic cough with __________  per patient.  Cannot rule out  __________ .   Will order __________ .  May repeat echocardiogram in the morning for evaluation of  further management.  Will continue O2 2 liters, no pain  medication is required at this moment.   Problem 2.  Diabetes mellitus type 2.  Will hold Metformin in case  __________  Cardiolite done tomorrow.  __________  and ADA carbohydrate  modified diet.   Problem 3.  Hypertension.  May be secondary to acute stress from  pain.  May  need to be checked __________ .  Will repeat fasting lipid profile to check  for hyperlipidemia and continue Crestor 10 mg p.o. daily.   Problem 4.  History of GERD.  Pain does not seem like GI in nature, but may  have PPI treatment after angina ruled out.   Problem 5.  Allergic rhinitis.  Will continue Allegra.   Problem 6.  Depression and anxiety.  Symptomatic.  Will continue Celexa 30  mg p.o. q.h.s.   Problem 7.  FEN.  Hep-lock IV.   Problem 8.  Right elbow medial epicondylitis, __________  p.o. q.6h.  Joint  rest and may need sling.      IM/MEDQ  D:  11/05/2004  T:  11/05/2004  Job:  [id number]

## 2010-11-29 ENCOUNTER — Ambulatory Visit: Payer: Self-pay | Attending: Family Medicine | Admitting: Rehabilitative and Restorative Service Providers"

## 2010-11-29 ENCOUNTER — Other Ambulatory Visit: Payer: Self-pay | Admitting: Family Medicine

## 2010-11-29 DIAGNOSIS — R293 Abnormal posture: Secondary | ICD-10-CM | POA: Insufficient documentation

## 2010-11-29 DIAGNOSIS — IMO0001 Reserved for inherently not codable concepts without codable children: Secondary | ICD-10-CM | POA: Insufficient documentation

## 2010-11-29 DIAGNOSIS — M25619 Stiffness of unspecified shoulder, not elsewhere classified: Secondary | ICD-10-CM | POA: Insufficient documentation

## 2010-11-29 DIAGNOSIS — M25519 Pain in unspecified shoulder: Secondary | ICD-10-CM | POA: Insufficient documentation

## 2010-11-29 DIAGNOSIS — M6281 Muscle weakness (generalized): Secondary | ICD-10-CM | POA: Insufficient documentation

## 2010-11-29 NOTE — Telephone Encounter (Signed)
Refill request

## 2010-12-04 ENCOUNTER — Ambulatory Visit: Payer: Medicaid Other | Attending: Orthopedic Surgery | Admitting: Physical Therapy

## 2010-12-04 DIAGNOSIS — M6281 Muscle weakness (generalized): Secondary | ICD-10-CM | POA: Insufficient documentation

## 2010-12-04 DIAGNOSIS — M25519 Pain in unspecified shoulder: Secondary | ICD-10-CM | POA: Insufficient documentation

## 2010-12-04 DIAGNOSIS — M25619 Stiffness of unspecified shoulder, not elsewhere classified: Secondary | ICD-10-CM | POA: Insufficient documentation

## 2010-12-04 DIAGNOSIS — IMO0001 Reserved for inherently not codable concepts without codable children: Secondary | ICD-10-CM | POA: Insufficient documentation

## 2010-12-06 ENCOUNTER — Ambulatory Visit: Payer: Medicaid Other | Admitting: Physical Therapy

## 2010-12-10 ENCOUNTER — Other Ambulatory Visit: Payer: Self-pay | Admitting: Family Medicine

## 2010-12-10 MED ORDER — METFORMIN HCL 1000 MG PO TABS: 1000.0000 mg | ORAL_TABLET | Freq: Two times a day (BID) | ORAL | Status: DC

## 2010-12-11 ENCOUNTER — Ambulatory Visit: Payer: Medicaid Other | Admitting: Physical Therapy

## 2010-12-13 ENCOUNTER — Ambulatory Visit: Payer: Medicaid Other | Admitting: Rehabilitative and Restorative Service Providers"

## 2010-12-18 ENCOUNTER — Ambulatory Visit: Payer: Medicaid Other | Admitting: Physical Therapy

## 2010-12-20 ENCOUNTER — Ambulatory Visit: Payer: Medicaid Other | Admitting: Physical Therapy

## 2010-12-25 ENCOUNTER — Ambulatory Visit: Payer: Medicaid Other | Admitting: Physical Therapy

## 2010-12-27 ENCOUNTER — Ambulatory Visit: Payer: Medicaid Other | Admitting: Physical Therapy

## 2010-12-31 ENCOUNTER — Ambulatory Visit (INDEPENDENT_AMBULATORY_CARE_PROVIDER_SITE_OTHER): Payer: Medicaid Other | Admitting: Family Medicine

## 2010-12-31 ENCOUNTER — Encounter: Payer: Self-pay | Admitting: Family Medicine

## 2010-12-31 ENCOUNTER — Telehealth: Payer: Self-pay | Admitting: *Deleted

## 2010-12-31 DIAGNOSIS — M75120 Complete rotator cuff tear or rupture of unspecified shoulder, not specified as traumatic: Secondary | ICD-10-CM

## 2010-12-31 DIAGNOSIS — F411 Generalized anxiety disorder: Secondary | ICD-10-CM

## 2010-12-31 DIAGNOSIS — J329 Chronic sinusitis, unspecified: Secondary | ICD-10-CM

## 2010-12-31 DIAGNOSIS — M069 Rheumatoid arthritis, unspecified: Secondary | ICD-10-CM

## 2010-12-31 DIAGNOSIS — E119 Type 2 diabetes mellitus without complications: Secondary | ICD-10-CM

## 2010-12-31 LAB — COMPREHENSIVE METABOLIC PANEL
ALT: 15 U/L (ref 0–35)
AST: 13 U/L (ref 0–37)
Alkaline Phosphatase: 53 U/L (ref 39–117)
CO2: 21 mEq/L (ref 19–32)
Creat: 0.65 mg/dL (ref 0.50–1.10)
Sodium: 137 mEq/L (ref 135–145)
Total Bilirubin: 0.4 mg/dL (ref 0.3–1.2)
Total Protein: 6.5 g/dL (ref 6.0–8.3)

## 2010-12-31 LAB — POCT GLYCOSYLATED HEMOGLOBIN (HGB A1C): Hemoglobin A1C: 7.4

## 2010-12-31 MED ORDER — OXYCODONE-ACETAMINOPHEN 10-325 MG PO TABS: 1.0000 | ORAL_TABLET | Freq: Four times a day (QID) | ORAL | Status: DC | PRN

## 2010-12-31 MED ORDER — CLONAZEPAM 0.5 MG PO TABS: 0.5000 mg | ORAL_TABLET | Freq: Three times a day (TID) | ORAL | Status: DC | PRN

## 2010-12-31 MED ORDER — AMOXICILLIN 875 MG PO TABS: 875.0000 mg | ORAL_TABLET | Freq: Two times a day (BID) | ORAL | Status: AC

## 2010-12-31 NOTE — Assessment & Plan Note (Signed)
Refilled meds for three months discussed at length that after this with the rehab is over will decrease dose to twice daily then as needed. Pt verbally agreed.

## 2010-12-31 NOTE — Assessment & Plan Note (Signed)
Significant improvement in A1C to 7.4, near goal, will continue current regimen and now stopping prednisone so should be more.

## 2010-12-31 NOTE — Telephone Encounter (Signed)
Faxed to medicaid

## 2010-12-31 NOTE — Progress Notes (Signed)
  Subjective:    Patient ID: Michelle [name], female    DOB: 17-Nov-1950, 60 y.o.   MRN: [id number]  Diabetes  Arthritis   Pt is here for a rheum arthritis. Pt states she was seen by [facility] on plaquenil and prednisone but decreasing the prednisone.  Seems controlled but still need the percocet due to pain everywhere.  Finding it hard to do some different activities of daily living without pain, mostly the pain is in the feet and the hands.  Pt recently had shoulder surgery still doing a lot of housework daily with one arm.  Pt shoulder pain better had surgery on May 25th.  Pt is now in rehab doing well still wearing a brace. Was left arm, has pain but full sensation.   2. Diabetes:  High at home:190 Low at home:90 Taking medications:yes Side effects:as above.  ROS: denies fever, chills, dizziness, loss of conscieness, polyuria poly dipsia numbness or tingling in extremities or chest pain.  3. Anxiety-  Pt on klonopin, still needs it especially with all the pain and no less responsibility.   Pt has refills on percocet and klonopin through June.   4.  Having sinus pain, has hx of multiple sinuses infections.  No fever but not feeling good, not eating as much.    Review of Systems  Musculoskeletal: Positive for arthritis.      Denies fever, chills, nausea vomiting abdominal pain, dysuria, chest pain, shortness of breath dyspnea on exertion or numbness in extremities  Past medical history, social, surgical and family history all reviewed.   Objective:   Physical Exam      General Appearance:    Alert, cooperative, no distress, appears stated age   Eyes:    PERRL, conjunctiva/corneas clear, EOM's intact, + sinus tenderness over maxillary sinuses.  Ears:    Normal TM's and external ear canals, both ears Throat:   Lips, mucosa, and tongue normal; teeth and gums normal Neck:   Supple, symmetrical, trachea midline, no adenopathy;  thyroid:   Lungs:    Clear to auscultation bilaterally,  respirations unlabored  Heart:   Regular rate and rhythm, S1 and S2 normal, no murmur, rub  or gallop Abdomen:     Soft, non-tender, bowel sounds active all four quadrants,   no masses, no organomegaly Extremities:   Left arm in sling, full sensation, no swelling,  Pulses:   2+ and symmetric all extremities              Assessment & Plan:

## 2010-12-31 NOTE — Assessment & Plan Note (Signed)
S/p 6 week surgery seemed to be improving, will refill percocet at this time and will continue to monitor.  Discussed at length that will start to down trend medication at next visit.

## 2010-12-31 NOTE — Patient Instructions (Signed)
Good to see you.   I am glad your sugars are doing well I am glad your shoulder was done.  Keep up with the therapy.  I am refilling you percocet and klonipin for the next three months.  When you return we will start to decrease those meds slowly.  Keep up the good work.

## 2010-12-31 NOTE — Telephone Encounter (Signed)
PA required for Nasonex. Form placed in Dr. Michaelle Copas box.

## 2010-12-31 NOTE — Telephone Encounter (Signed)
Gave note back to [place] filled out.

## 2010-12-31 NOTE — Assessment & Plan Note (Signed)
Pt now on plaquenil followed by [facility], decreasing prednisone will not give zostervax due to being on immunosupresion, refilled percocet but will be downtrending dose at next visit to 8 hours.  Pt verbally agreed as long as rehab does well. Pt is scheduled with opthomolgist at this time for being on plaquenil.

## 2010-12-31 NOTE — Assessment & Plan Note (Signed)
With hx of sinusitis and tenderness over maxillary sinuses, will give amoxicillin wait and see script for 48 hours, if not better than pt is to fill med.

## 2011-01-01 ENCOUNTER — Ambulatory Visit: Payer: Medicaid Other | Attending: Family Medicine | Admitting: Physical Therapy

## 2011-01-01 DIAGNOSIS — M25619 Stiffness of unspecified shoulder, not elsewhere classified: Secondary | ICD-10-CM | POA: Insufficient documentation

## 2011-01-01 DIAGNOSIS — M25519 Pain in unspecified shoulder: Secondary | ICD-10-CM | POA: Insufficient documentation

## 2011-01-01 DIAGNOSIS — M6281 Muscle weakness (generalized): Secondary | ICD-10-CM | POA: Insufficient documentation

## 2011-01-01 DIAGNOSIS — R293 Abnormal posture: Secondary | ICD-10-CM | POA: Insufficient documentation

## 2011-01-01 DIAGNOSIS — IMO0001 Reserved for inherently not codable concepts without codable children: Secondary | ICD-10-CM | POA: Insufficient documentation

## 2011-01-03 ENCOUNTER — Ambulatory Visit: Payer: Medicaid Other | Admitting: Physical Therapy

## 2011-01-08 ENCOUNTER — Ambulatory Visit: Payer: Medicaid Other | Admitting: Physical Therapy

## 2011-01-10 ENCOUNTER — Encounter: Payer: Medicaid Other | Admitting: Physical Therapy

## 2011-01-11 ENCOUNTER — Ambulatory Visit: Payer: Medicaid Other | Admitting: Rehabilitative and Restorative Service Providers"

## 2011-01-15 ENCOUNTER — Ambulatory Visit: Payer: Medicaid Other | Admitting: Rehabilitative and Restorative Service Providers"

## 2011-01-17 ENCOUNTER — Ambulatory Visit: Payer: Medicaid Other | Admitting: Rehabilitative and Restorative Service Providers"

## 2011-01-21 ENCOUNTER — Ambulatory Visit: Payer: Medicaid Other | Admitting: Physical Therapy

## 2011-01-24 ENCOUNTER — Encounter: Payer: Self-pay | Admitting: Family Medicine

## 2011-01-25 ENCOUNTER — Ambulatory Visit: Payer: Medicaid Other | Admitting: Rehabilitative and Restorative Service Providers"

## 2011-01-28 ENCOUNTER — Encounter: Payer: Self-pay | Admitting: Family Medicine

## 2011-01-28 ENCOUNTER — Ambulatory Visit (INDEPENDENT_AMBULATORY_CARE_PROVIDER_SITE_OTHER): Payer: Medicaid Other | Admitting: Family Medicine

## 2011-01-28 VITALS — BP 148/86 | HR 98 | Temp 98.0°F | Ht 63.0 in | Wt 172.4 lb

## 2011-01-28 DIAGNOSIS — R300 Dysuria: Secondary | ICD-10-CM

## 2011-01-28 DIAGNOSIS — G4700 Insomnia, unspecified: Secondary | ICD-10-CM

## 2011-01-28 DIAGNOSIS — N390 Urinary tract infection, site not specified: Secondary | ICD-10-CM | POA: Insufficient documentation

## 2011-01-28 LAB — POCT UA - MICROSCOPIC ONLY: RBC, urine, microscopic: >20

## 2011-01-28 LAB — POCT URINALYSIS DIPSTICK
Bilirubin, UA: NEGATIVE
Protein, UA: 100
Spec Grav, UA: >=1.03
pH, UA: 5.5

## 2011-01-28 MED ORDER — ALBUTEROL SULFATE HFA 108 (90 BASE) MCG/ACT IN AERS: 2.0000 | INHALATION_SPRAY | RESPIRATORY_TRACT | Status: DC | PRN

## 2011-01-28 MED ORDER — ZOLPIDEM TARTRATE 10 MG PO TABS: 10.0000 mg | ORAL_TABLET | Freq: Every evening | ORAL | Status: DC | PRN

## 2011-01-28 MED ORDER — CEPHALEXIN 500 MG PO CAPS: 500.0000 mg | ORAL_CAPSULE | Freq: Two times a day (BID) | ORAL | Status: DC

## 2011-01-28 NOTE — Patient Instructions (Signed)
Take Keflex antibiotic for 10 days for your urine infection.  If you urine culture is not sensitive to this antibiotic, I will call you and change it to one that is.  If you have fevers (temperature >100.4) and worsening back pain, call the clinic and let me know.

## 2011-01-29 NOTE — Assessment & Plan Note (Signed)
Will try insomnia. Patient requesting medication and reports trying good sleep hygiene measures. I think it will be appropriate to give a few Ambien since she only complains of occasional (and not daily) insomnia and especially with her chronic prednisone use that may make her feel more "wired" than otherwise.

## 2011-01-29 NOTE — Progress Notes (Signed)
  Subjective:    Patient ID: Michelle [name], female    DOB: 03-01-1951, 60 y.o.   MRN: [id number]  HPI 1. Thinks she has UTI.  Started a few days ago. Urinary urgency and frequency and dysuria.  Some right-sided pain that also started.  Last UTI a few years ago.  Still on Plaquenil and low-dose prednisone, which had been discontinued but was re-started due to worsening hand joint swelling.   Review of Systems Denies fevers, chills, hematuria, nausea/vomiting/diarrhea, dyspnea.     2. Also complaining of insomnia for the past several months. Feels this is not related to her prednisone.  Drinks a cup or two of coffee in the morning but no other caffeinated beverages.  No television before bedtime, tries to read.  Has a difficult time falling sleep. Feels restless.  Requesting medication.     Objective:   Physical Exam General: NAD HEENT: MMM CV: RRR Pulm: CTAB Abd: NABS, soft, non-tender Back: no CVA tenderness, some TTP right lateral side    Assessment & Plan:

## 2011-01-29 NOTE — Assessment & Plan Note (Signed)
UA shows likely UTI, especially in this immunosuppressed individual. Will treat with Keflex x 10 days. Will follow-up urine culture. Patient given red flags for developing pyelonephritis.

## 2011-01-31 ENCOUNTER — Emergency Department (HOSPITAL_COMMUNITY)
Admission: EM | Admit: 2011-01-31 | Discharge: 2011-01-31 | Disposition: A | Discharge status: Home / Self Care | Payer: Medicaid Other | Attending: Emergency Medicine | Admitting: Emergency Medicine

## 2011-01-31 ENCOUNTER — Other Ambulatory Visit: Payer: Self-pay | Admitting: Orthopedic Surgery

## 2011-01-31 ENCOUNTER — Ambulatory Visit: Payer: Medicaid Other | Attending: Family Medicine | Admitting: Physical Therapy

## 2011-01-31 DIAGNOSIS — R293 Abnormal posture: Secondary | ICD-10-CM | POA: Insufficient documentation

## 2011-01-31 DIAGNOSIS — F329 Major depressive disorder, single episode, unspecified: Secondary | ICD-10-CM | POA: Insufficient documentation

## 2011-01-31 DIAGNOSIS — E119 Type 2 diabetes mellitus without complications: Secondary | ICD-10-CM | POA: Insufficient documentation

## 2011-01-31 DIAGNOSIS — I10 Essential (primary) hypertension: Secondary | ICD-10-CM | POA: Insufficient documentation

## 2011-01-31 DIAGNOSIS — Z79899 Other long term (current) drug therapy: Secondary | ICD-10-CM | POA: Insufficient documentation

## 2011-01-31 DIAGNOSIS — F3289 Other specified depressive episodes: Secondary | ICD-10-CM | POA: Insufficient documentation

## 2011-01-31 DIAGNOSIS — M25519 Pain in unspecified shoulder: Secondary | ICD-10-CM | POA: Insufficient documentation

## 2011-01-31 DIAGNOSIS — M25412 Effusion, left shoulder: Secondary | ICD-10-CM

## 2011-01-31 DIAGNOSIS — M25419 Effusion, unspecified shoulder: Secondary | ICD-10-CM | POA: Insufficient documentation

## 2011-01-31 DIAGNOSIS — IMO0001 Reserved for inherently not codable concepts without codable children: Secondary | ICD-10-CM | POA: Insufficient documentation

## 2011-01-31 DIAGNOSIS — M6281 Muscle weakness (generalized): Secondary | ICD-10-CM | POA: Insufficient documentation

## 2011-01-31 DIAGNOSIS — M25619 Stiffness of unspecified shoulder, not elsewhere classified: Secondary | ICD-10-CM | POA: Insufficient documentation

## 2011-01-31 LAB — URINE CULTURE

## 2011-02-01 ENCOUNTER — Telehealth: Payer: Self-pay | Admitting: Family Medicine

## 2011-02-01 MED ORDER — FLUTICASONE PROPIONATE 50 MCG/ACT NA SUSP: 2.0000 | Freq: Every day | NASAL | Status: DC

## 2011-02-01 NOTE — Telephone Encounter (Signed)
Pt states that Dr Katrinka Blazing was supposed to call in Flonase since Medicaid denied the other.  Prefers Flonase anyway. [facility] [address]

## 2011-02-04 ENCOUNTER — Ambulatory Visit
Admission: RE | Admit: 2011-02-04 | Discharge: 2011-02-04 | Disposition: A | Discharge status: Home / Self Care | Payer: Medicaid Other | Source: Ambulatory Visit | Attending: Orthopedic Surgery | Admitting: Orthopedic Surgery

## 2011-02-04 ENCOUNTER — Other Ambulatory Visit: Payer: Medicaid Other

## 2011-02-04 ENCOUNTER — Encounter: Payer: Medicaid Other | Admitting: Physical Therapy

## 2011-02-04 DIAGNOSIS — M25412 Effusion, left shoulder: Secondary | ICD-10-CM

## 2011-02-05 ENCOUNTER — Encounter: Payer: Medicaid Other | Admitting: Physical Therapy

## 2011-02-06 ENCOUNTER — Ambulatory Visit: Payer: Medicaid Other | Admitting: Physical Therapy

## 2011-02-07 ENCOUNTER — Telehealth: Payer: Self-pay | Admitting: Family Medicine

## 2011-02-07 ENCOUNTER — Encounter: Payer: Medicaid Other | Admitting: Physical Therapy

## 2011-02-07 NOTE — Telephone Encounter (Signed)
Discussed with pt and states she is having trouble at this time. Will increase her klonopin to 1mg  tid for the next 3 days then to go back to regular dose.

## 2011-02-07 NOTE — Telephone Encounter (Signed)
Needs something stronger than Klonopin.  [facility] [address].  She said the reason she needs a stronger dose is because her sister was diagnosed with Cancer 3 weeks ago and today she was told that her daughter has Cancer.  She thought she could handle it, but then after the news today, she couldn't take anymore.  Please call her.

## 2011-02-12 ENCOUNTER — Ambulatory Visit: Payer: Medicaid Other | Admitting: Physical Therapy

## 2011-02-14 ENCOUNTER — Ambulatory Visit: Payer: Medicaid Other | Admitting: Rehabilitative and Restorative Service Providers"

## 2011-02-14 ENCOUNTER — Encounter: Payer: Self-pay | Admitting: Family Medicine

## 2011-02-14 ENCOUNTER — Ambulatory Visit (INDEPENDENT_AMBULATORY_CARE_PROVIDER_SITE_OTHER): Payer: Medicaid Other | Admitting: Family Medicine

## 2011-02-14 DIAGNOSIS — F411 Generalized anxiety disorder: Secondary | ICD-10-CM

## 2011-02-14 DIAGNOSIS — E119 Type 2 diabetes mellitus without complications: Secondary | ICD-10-CM

## 2011-02-14 DIAGNOSIS — M069 Rheumatoid arthritis, unspecified: Secondary | ICD-10-CM

## 2011-02-14 DIAGNOSIS — G4700 Insomnia, unspecified: Secondary | ICD-10-CM

## 2011-02-14 MED ORDER — OXYCODONE-ACETAMINOPHEN 10-325 MG PO TABS: 1.0000 | ORAL_TABLET | Freq: Four times a day (QID) | ORAL | Status: DC | PRN

## 2011-02-14 MED ORDER — CLONAZEPAM 1 MG PO TABS: 1.0000 mg | ORAL_TABLET | Freq: Three times a day (TID) | ORAL | Status: DC | PRN

## 2011-02-14 MED ORDER — ZOLPIDEM TARTRATE 10 MG PO TABS: 10.0000 mg | ORAL_TABLET | Freq: Every evening | ORAL | Status: AC | PRN

## 2011-02-14 NOTE — Progress Notes (Signed)
  Subjective:    Patient ID: Michelle [name], female    DOB: 24-Apr-1951, 60 y.o.   MRN: [id number]  Diabetes  Arthritis   Pt is here for a rheum arthritis. Pt states she was seen by [facility] on plaquenil and prednisone but decreasing the prednisone.  Seems controlled but still need the percocet due to pain everywhere. Pt had shoulder surgery on May 25th and has been doing rehab doing well but having significant amount of swelling. Pt still does not have full strength but is doing much better. Pt still seeing [facility].   2. Diabetes:  High at home:170 Low at home:95 Taking medications:yes Side effects:as above de to prednisone ROS: denies fever, chills, dizziness, loss of conscieness, polyuria poly dipsia numbness or tingling in extremities or chest pain.  3. Anxiety-  Pt on klonopin, has had very bad new recently due to pt sister being diagnosed with breast cancer and daughter being diagnosed with colon cancer.  Pt states she cannot sleep and is shaking all the time, pt states she has to be the strong one in the family and is finding it hard.  When asked if she would liek to talk to someone she states she does not have the time.  Pt has been taking 1mg  of her klonopin and has not noticed much of a difference but is also on prednisone at this time. Denies  Suicidal and Homicidal ideation .    Review of Systems  Musculoskeletal: Positive for arthritis.      Denies fever, chills, nausea vomiting abdominal pain, dysuria, chest pain, shortness of breath dyspnea on exertion or numbness in extremities  Past medical history, social, surgical and family history all reviewed.   Objective:   Physical Exam    General Appearance:    Alert, cooperative, no distress, appears stated age   Eyes:    PERRL, conjunctiva/corneas clear, EOM's intact,  Ears:    Normal TM's and external ear canals, both ears Throat:   Lips, mucosa, and tongue normal; teeth and gums normal Neck:   Supple, symmetrical, trachea  midline, no adenopathy;  thyroid:   Lungs:    Clear to auscultation bilaterally, respirations unlabored  Heart:   Regular rate and rhythm, S1 and S2 normal, no murmur, rub  or gallop Abdomen:     Soft, non-tender, bowel sounds active all four quadrants,   no masses, no organomegaly Extremities:   Left arm shows peri deltoid swelling secondary to new shoulder,  Incision well healed, improved ROM and strength from last visit up to 4.5  Pulses:   2+ and symmetric all extremities  Psych:  Pt is shaking, and has some pressured speech.             Assessment & Plan:

## 2011-02-14 NOTE — Assessment & Plan Note (Signed)
Lab Results  Component Value Date   HGBA1C 7.4 12/31/2010   Pt not due til next visit, will improve if pt can come off prednisone. COntinue current regimen.

## 2011-02-14 NOTE — Assessment & Plan Note (Signed)
Continue current pain meds at this time, pt doing well not ready to decrease dose yet.  Pt being treated at [facility] with plaquenil.

## 2011-02-14 NOTE — Assessment & Plan Note (Signed)
Pt seems to be doing worse due to the new news about her family.  Will increase her klonopin to 1mg  tid for now and for the next three months due to likely having a lot of stress and uncertainty in the near future.  The plan though at that point will be to decrease from that point when she follows up in three months.

## 2011-02-19 ENCOUNTER — Encounter: Payer: Medicaid Other | Admitting: Rehabilitative and Restorative Service Providers"

## 2011-02-20 ENCOUNTER — Encounter: Payer: Medicaid Other | Admitting: Physical Therapy

## 2011-02-21 ENCOUNTER — Encounter: Payer: Medicaid Other | Admitting: Rehabilitative and Restorative Service Providers"

## 2011-03-25 ENCOUNTER — Encounter: Payer: Self-pay | Admitting: Family Medicine

## 2011-03-25 ENCOUNTER — Ambulatory Visit (INDEPENDENT_AMBULATORY_CARE_PROVIDER_SITE_OTHER): Payer: Medicaid Other | Admitting: Family Medicine

## 2011-03-25 DIAGNOSIS — M25519 Pain in unspecified shoulder: Secondary | ICD-10-CM

## 2011-03-25 DIAGNOSIS — M069 Rheumatoid arthritis, unspecified: Secondary | ICD-10-CM

## 2011-03-25 DIAGNOSIS — F411 Generalized anxiety disorder: Secondary | ICD-10-CM

## 2011-03-25 DIAGNOSIS — M25511 Pain in right shoulder: Secondary | ICD-10-CM | POA: Insufficient documentation

## 2011-03-25 HISTORY — DX: Pain in unspecified shoulder: M25.519

## 2011-03-25 MED ORDER — CLONAZEPAM 1 MG PO TABS: 1.0000 mg | ORAL_TABLET | Freq: Three times a day (TID) | ORAL | Status: DC | PRN

## 2011-03-25 MED ORDER — OXYCODONE-ACETAMINOPHEN 10-325 MG PO TABS: 1.0000 | ORAL_TABLET | Freq: Four times a day (QID) | ORAL | Status: DC | PRN

## 2011-03-25 NOTE — Patient Instructions (Signed)
Patient given verbal instruction and handout

## 2011-03-25 NOTE — Progress Notes (Signed)
  Subjective:    Patient ID: Michelle [name], female    DOB: 16-Mar-1951, 60 y.o.   MRN: [id number]  HPI  60 year old female coming in now with new onset of right shoulder pain. Patient has had left shoulder pain for quite some time and is now going to have a revision of a rotator cuff repair on the left side. At this time patient has been using her right arm much more frequently than usual and notices that she's having some of the same pains that she did initially with the left shoulder. Patient has a history of rheumatoid arthritis as well as diabetes which increases her risk of rotator cuff problems. Patient is on Plaquenil as well as prednisone. Patient states certain movements it definitely caused more pain especially overhead movements am trying to hold anything over 10 pounds. Patient states that she has been having a little bit of weakness but no numbness in still able to use the arm much better than her left arm  Patient has been more stressed recently as well patient's sister who has the diagnosis of cancer she does have fundraiser for her and did raise over $7000 which is very helpful but it was a very strenuous and stressful time  Review of Systems Denies fevers or chills or as stated in the history of present illness Past medical history, social, surgical and family history all reviewed.      Objective:   Physical Exam Shoulder: right Inspection reveals no abnormalities, atrophy or asymmetry. Does have dry skin Palpation is normal with no tenderness over AC joint or bicipital groove. ROM is restricted in abduction as well as flexion and internal rotation Rotator cuff strength 3/5 Positive Neer and Hawkin's tests, empty can. Speeds and Yergason's tests normal. No labral pathology noted with negative Obrien's, negative clunk and good stability. Normal scapular function observed. No painful arc and no drop arm sign. No apprehension sign  Left shoulder has significant swelling  from shoulder to mid humerus. Some mild redness and warmth patient though has had labs taken by [facility] and they were normal with no signs of infection  Procedure note After verbal and written consent given pt was prepped with betadine.  1:3 kenalog 40 to lidocaine used in right shoulder.  Pt minimal bleeding dressed with band aid, Pt given red flags to look for pt had better pain control immediatly.       Assessment & Plan:

## 2011-03-25 NOTE — Assessment & Plan Note (Signed)
Patient is compensating for her left side given an injection today patient will start her exercises given Theragran as well as another handout patient will return as she Astrid [employer]s but will be having surgery on the left shoulder for a revision of her rotator cuff we'll need to monitor and will see patient when she can finally drive

## 2011-03-25 NOTE — Assessment & Plan Note (Signed)
H. and still has a lot of stress especially with family daughter having a new Chiari malformation as well as sister having cancer will refill her Klonopin again at this point especially with her having surgery again and not knowing when she'll be able to come back for another office visit

## 2011-03-25 NOTE — Assessment & Plan Note (Signed)
Patient is being followed by rheumatologist at [facility] patient is on Plaquenil and as well as prednisone

## 2011-03-31 ENCOUNTER — Other Ambulatory Visit: Payer: Self-pay | Admitting: Family Medicine

## 2011-03-31 NOTE — Telephone Encounter (Signed)
Refill request

## 2011-04-29 ENCOUNTER — Ambulatory Visit: Payer: Medicaid Other | Attending: Family Medicine | Admitting: Rehabilitative and Restorative Service Providers"

## 2011-04-29 DIAGNOSIS — M6281 Muscle weakness (generalized): Secondary | ICD-10-CM | POA: Insufficient documentation

## 2011-04-29 DIAGNOSIS — M25619 Stiffness of unspecified shoulder, not elsewhere classified: Secondary | ICD-10-CM | POA: Insufficient documentation

## 2011-04-29 DIAGNOSIS — R293 Abnormal posture: Secondary | ICD-10-CM | POA: Insufficient documentation

## 2011-04-29 DIAGNOSIS — IMO0001 Reserved for inherently not codable concepts without codable children: Secondary | ICD-10-CM | POA: Insufficient documentation

## 2011-04-29 DIAGNOSIS — M25519 Pain in unspecified shoulder: Secondary | ICD-10-CM | POA: Insufficient documentation

## 2011-05-01 ENCOUNTER — Encounter: Payer: Self-pay | Admitting: Family Medicine

## 2011-05-01 ENCOUNTER — Ambulatory Visit (INDEPENDENT_AMBULATORY_CARE_PROVIDER_SITE_OTHER): Payer: Medicaid Other | Admitting: Family Medicine

## 2011-05-01 VITALS — BP 152/94 | HR 91 | Temp 97.5°F | Ht 63.0 in | Wt 176.1 lb

## 2011-05-01 DIAGNOSIS — M069 Rheumatoid arthritis, unspecified: Secondary | ICD-10-CM

## 2011-05-01 DIAGNOSIS — M75120 Complete rotator cuff tear or rupture of unspecified shoulder, not specified as traumatic: Secondary | ICD-10-CM

## 2011-05-01 DIAGNOSIS — J449 Chronic obstructive pulmonary disease, unspecified: Secondary | ICD-10-CM

## 2011-05-01 DIAGNOSIS — R300 Dysuria: Secondary | ICD-10-CM

## 2011-05-01 LAB — POCT URINALYSIS DIPSTICK
Bilirubin, UA: NEGATIVE
Glucose, UA: NEGATIVE
Leukocytes, UA: NEGATIVE
Nitrite, UA: NEGATIVE

## 2011-05-01 MED ORDER — CEPHALEXIN 500 MG PO CAPS: 500.0000 mg | ORAL_CAPSULE | Freq: Two times a day (BID) | ORAL | Status: AC

## 2011-05-01 MED ORDER — FLUCONAZOLE 150 MG PO TABS: 150.0000 mg | ORAL_TABLET | Freq: Every day | ORAL | Status: AC

## 2011-05-01 NOTE — Assessment & Plan Note (Signed)
Patient will continue the current medication she is on that she is receiving from the rheumatologist at [facility]

## 2011-05-01 NOTE — Assessment & Plan Note (Signed)
Patient had sutures removed today they were a little tough to get out secondary to being in too long. Patient states that she was unable to get an appointment with her orthopedic surgeon. She has a followup with them on 15 November. Patient is still having a significant amount of pain encourage her to continue the physical therapy and this is the most important thing she can do at this time. Patient will followup with me again in one to 2 weeks for chronic pain and we'll see how she does.

## 2011-05-01 NOTE — Progress Notes (Signed)
  Subjective:    Patient ID: Michelle [name], female    DOB: [birth year], 60 y.o.   MRN: [id number]  HPI 60 year old female with multiple medical problems coming in with multiple complaints. 1.  Dysuria. Patient has had UTIs during her time. She has had rotator cuff repair as well as a revision done this year. Since then unable to describe what she seems to be getting more tract infections. Patient denies any fevers or chills or back pain out of the ordinary. Patient denies any hematuria either. Patient though does state she has increased frequency and urgency patient has had a culture of Escherichia coli that would have been pansensitive previously.  2. rheumatoid arthritis patient is seen a rheumatologist and as well as an orthopedic doctor for a rotator cuff repair on the left side. Patient's had a revision approximately 4 weeks ago and at this time still has her sutures in place patient's surgery was on April 05, 2011 by Dr. Maylon Peppers at [facility].  Patient is just started physical therapy at this time and is having some pain unable to move arm much. Patient denies any numbness or loss of sensation in her left arm. Patient states at this time she has had a bad interaction with her orthopedic surgeon. Patient states that he has not been very friendly and not discussing the next steps in her treatment.  3. COPD patient is doing very well continuing the same inhalers denies any shortness of breath out of the ordinary.     Review of Systems As stated in the history of present illness    Objective:   Physical Exam General Appearance:    Alert, cooperative, no distress, appears stated age Eyes:    PERRL, conjunctiva/corneas clear, EOM's intact,  Ears:    Normal TM's and external ear canals, both ears Throat:   Lips, mucosa, and tongue normal; teeth and gums normal Neck:   Supple, symmetrical, trachea midline, no adenopathy;  thyroid:   Lungs:    Clear to auscultation bilaterally, respirations unlabored  Heart:   Regular rate and rhythm, S1 and S2 normal, no murmur, rub  or gallop Abdomen:     Soft, non-tender, bowel sounds active all four quadrants,   no masses, no organomegaly Extremities:   Left arm shows peri deltoid swelling secondary to new shoulder,  I patient had sutures in 4 different places from the arthroscopic procedure they were removed today patient only has flexion up to 60 abduction up to 40 and still some restricted in internal and external rotation Pulses:   2+ and symmetric all extremities .       Assessment & Plan:

## 2011-05-01 NOTE — Assessment & Plan Note (Signed)
Still controlled we'll continue current therapy

## 2011-05-01 NOTE — Patient Instructions (Signed)
Patient given verbal instructions 

## 2011-05-02 ENCOUNTER — Ambulatory Visit: Payer: Medicaid Other | Attending: Orthopedic Surgery | Admitting: Rehabilitation

## 2011-05-02 DIAGNOSIS — M25619 Stiffness of unspecified shoulder, not elsewhere classified: Secondary | ICD-10-CM | POA: Insufficient documentation

## 2011-05-02 DIAGNOSIS — M25519 Pain in unspecified shoulder: Secondary | ICD-10-CM | POA: Insufficient documentation

## 2011-05-02 DIAGNOSIS — IMO0001 Reserved for inherently not codable concepts without codable children: Secondary | ICD-10-CM | POA: Insufficient documentation

## 2011-05-06 ENCOUNTER — Encounter: Payer: Medicaid Other | Admitting: Rehabilitation

## 2011-05-09 ENCOUNTER — Ambulatory Visit: Payer: Medicaid Other | Admitting: Rehabilitation

## 2011-05-14 ENCOUNTER — Ambulatory Visit: Payer: Medicaid Other | Admitting: Rehabilitative and Restorative Service Providers"

## 2011-05-16 ENCOUNTER — Ambulatory Visit: Payer: Medicaid Other | Admitting: Rehabilitation

## 2011-05-21 ENCOUNTER — Encounter: Payer: Medicaid Other | Admitting: Physical Therapy

## 2011-05-27 ENCOUNTER — Ambulatory Visit: Payer: Medicaid Other | Admitting: Rehabilitation

## 2011-05-28 ENCOUNTER — Other Ambulatory Visit: Payer: Self-pay | Admitting: Family Medicine

## 2011-05-28 ENCOUNTER — Ambulatory Visit (INDEPENDENT_AMBULATORY_CARE_PROVIDER_SITE_OTHER): Payer: Medicaid Other | Admitting: Family Medicine

## 2011-05-28 ENCOUNTER — Encounter: Payer: Self-pay | Admitting: Family Medicine

## 2011-05-28 VITALS — BP 166/95 | HR 93 | Temp 98.0°F | Ht 63.0 in | Wt 178.0 lb

## 2011-05-28 DIAGNOSIS — M75120 Complete rotator cuff tear or rupture of unspecified shoulder, not specified as traumatic: Secondary | ICD-10-CM

## 2011-05-28 DIAGNOSIS — E119 Type 2 diabetes mellitus without complications: Secondary | ICD-10-CM

## 2011-05-28 DIAGNOSIS — F411 Generalized anxiety disorder: Secondary | ICD-10-CM

## 2011-05-28 DIAGNOSIS — M069 Rheumatoid arthritis, unspecified: Secondary | ICD-10-CM

## 2011-05-28 LAB — POCT GLYCOSYLATED HEMOGLOBIN (HGB A1C): Hemoglobin A1C: 7.5

## 2011-05-28 MED ORDER — OXYCODONE-ACETAMINOPHEN 10-325 MG PO TABS: 1.0000 | ORAL_TABLET | Freq: Four times a day (QID) | ORAL | Status: DC | PRN

## 2011-05-28 MED ORDER — CLONAZEPAM 1 MG PO TABS: 1.0000 mg | ORAL_TABLET | Freq: Three times a day (TID) | ORAL | Status: DC | PRN

## 2011-05-28 NOTE — Assessment & Plan Note (Signed)
Lab Results  Component Value Date   HGBA1C 7.5 05/28/2011    Patient was previously 7.4 these have improved over the last 6 months from a 9.2 we'll continue current regimen encourage patient continued to watch her diet as well as try to increase her physical activity

## 2011-05-28 NOTE — Patient Instructions (Signed)
Patient will be called when set up with [facility]. Patient was refilled her Percocet as well as her Klonopin today We'll have her followup in 3 months time for reevaluation

## 2011-05-28 NOTE — Progress Notes (Signed)
  Subjective:    Patient ID: Michelle [name], female    DOB: October 20, 1950, 60 y.o.   MRN: [id number]  HPI  Left shoulder rotator cuff repair: Patient has been seen for this problem at [facility] during the course of this last year. During this time she has had 2 rotator cuff repair surgery done. Patient has regained some of her strength but has not increased any of her range of motion and has had a significant amount of swelling surrounding the left shoulder as well as going down the left upper arm. Patient denies any numbness or tingling but is frustrated at the care that she was receiving at [facility] orthopedics at this time. Patient did have notes from [facility] that did state that they did off label injection of an immunomodulator that has not seemed to help. This note will be scanned in and inpatient records. Patient is continuing to do her physical therapy and does think that this is helping somewhat but still not helping her range of motion. Patient is able to do most of her activities of daily living unless it entails lifting her arm over her head with the aid of her Percocet that she does get for me. Patient at this time would like a second opinion from somebody else if possible.  Patient was having pain last saw her in the right shoulder secondary to overuse tissue is in her left arm at all. Patient had an injection is doing very well with the right arm and able to use it without any hesitation or pain.  Rheumatoid arthritis patient has been seen by a rheumatologist at [facility]. Patient is taking Plaquenil 200 mg twice a day as well as prednisone 10 mg daily she has not seen much improvement at this time and she states that they may be starting her on a new medication here in the near future. Patient unable to give me the name and I do not have any notes from a rheumatologist   Diabetes:  High at home:not checking Low at home:not checking Taking medications:yes Side effects:no ROS: denies fever, chills, dizziness,  loss of conscieness, polyuria poly dipsia numbness or tingling in extremities or chest pain. Lab Results  Component Value Date   HGBA1C 7.5 05/28/2011    Hypertension Blood pressure at home: Not checking Blood pressure today: Not checking Taking Meds: Yes Side effects: No ROS: Denies headache visual changes nausea, vomiting, chest pain or abdominal pain or shortness of breath.   Review of Systems Denies fever, chills, nausea, vomiting, abdominal pain. Past medical history, social, surgical and family history all reviewed.     Objective:   Physical Exam General: No apparent distress Cardiovascular: Regular rate and rhythm no murmur Pulmonary: Clear to auscultation bilaterally Shoulder:Left Inspection reveals significant nonpitting edema over the left shoulder capsule that extends approximately 1/3 down the proximal upper arm minorly warm to touch but no gross erythema or signs of infection. Palpation is generalized tenderness with even light touching ROM is reduced in all planes of motion patient has significant decrease in internal and external range of motion Rotator cuff strength 4/5 compared to 5 out of 5 on contralateral side. No signs of impingement with negative Neer and Hawkin's tests,  +empty can. Speeds and Yergason's tests normal. + Obrien's, negative clunk and good stability. Neurovascularly intact distally    Assessment & Plan:

## 2011-05-28 NOTE — Assessment & Plan Note (Signed)
Refills Klonopin today for 3 months

## 2011-05-28 NOTE — Assessment & Plan Note (Signed)
Discussed at length with patient about her left shoulder. At this time patient seems to be healing the going very slowly and does have significant amount of swelling likely capsular swelling in this area. Patient's strength is better than it was prior to surgery but still not back at her baseline. The pain and swelling is continuing to keep her from doing most of her activities of daily living with that arm. Patient is also very frustrated with you and see orthopedics do to poor communication with her as well as her family. Patient would like a second opinion. We will see if to for the peaks would be nice enough to see this lovely lady and see if they have any other ideas. Patient was wrapped with a Ace bandage today to try to get some compression. Patient is unable to afford a upper tremor the compression hose due to Medicaid not covering this. Hopefully [facility] will have other ideas as well to help.

## 2011-05-28 NOTE — Progress Notes (Signed)
Addended by: Judi Saa on: 05/28/2011 11:12 AM   Modules accepted: Orders

## 2011-05-28 NOTE — Assessment & Plan Note (Signed)
Patient is continuing to be on Plaquenil as well as prednisone may be starting a new medication here in the future. We'll continue to monitor patient does give her narcotics from me and was refilled for 3 months at this time

## 2011-05-29 ENCOUNTER — Telehealth: Payer: Self-pay | Admitting: *Deleted

## 2011-05-29 NOTE — Telephone Encounter (Signed)
Refill request

## 2011-05-29 NOTE — Telephone Encounter (Signed)
PA required for Nexium. Form placed in MD box. 

## 2011-05-30 ENCOUNTER — Ambulatory Visit: Payer: Medicaid Other | Admitting: Rehabilitative and Restorative Service Providers"

## 2011-05-30 NOTE — Telephone Encounter (Signed)
Faxed to medicaid

## 2011-05-30 NOTE — Telephone Encounter (Signed)
Filled out and returned to lynn

## 2011-06-03 ENCOUNTER — Other Ambulatory Visit: Payer: Self-pay | Admitting: Family Medicine

## 2011-06-03 DIAGNOSIS — Z1231 Encounter for screening mammogram for malignant neoplasm of breast: Secondary | ICD-10-CM

## 2011-06-04 ENCOUNTER — Ambulatory Visit: Payer: Medicaid Other | Attending: Family Medicine | Admitting: Rehabilitation

## 2011-06-04 DIAGNOSIS — R293 Abnormal posture: Secondary | ICD-10-CM | POA: Insufficient documentation

## 2011-06-04 DIAGNOSIS — IMO0001 Reserved for inherently not codable concepts without codable children: Secondary | ICD-10-CM | POA: Insufficient documentation

## 2011-06-04 DIAGNOSIS — M25619 Stiffness of unspecified shoulder, not elsewhere classified: Secondary | ICD-10-CM | POA: Insufficient documentation

## 2011-06-04 DIAGNOSIS — M6281 Muscle weakness (generalized): Secondary | ICD-10-CM | POA: Insufficient documentation

## 2011-06-04 DIAGNOSIS — M25519 Pain in unspecified shoulder: Secondary | ICD-10-CM | POA: Insufficient documentation

## 2011-06-06 ENCOUNTER — Ambulatory Visit: Payer: Medicaid Other | Admitting: Rehabilitation

## 2011-06-11 ENCOUNTER — Ambulatory Visit: Payer: Medicaid Other | Admitting: Rehabilitation

## 2011-06-18 ENCOUNTER — Encounter: Payer: Medicaid Other | Admitting: Rehabilitation

## 2011-06-18 ENCOUNTER — Ambulatory Visit: Payer: Medicaid Other | Admitting: Rehabilitative and Restorative Service Providers"

## 2011-07-08 ENCOUNTER — Ambulatory Visit (HOSPITAL_COMMUNITY)
Admission: RE | Admit: 2011-07-08 | Discharge: 2011-07-08 | Disposition: A | Discharge status: Home / Self Care | Payer: Medicaid Other | Source: Ambulatory Visit | Attending: Family Medicine | Admitting: Family Medicine

## 2011-07-08 DIAGNOSIS — Z1231 Encounter for screening mammogram for malignant neoplasm of breast: Secondary | ICD-10-CM | POA: Insufficient documentation

## 2011-07-11 ENCOUNTER — Telehealth: Payer: Self-pay | Admitting: Family Medicine

## 2011-07-11 NOTE — Telephone Encounter (Signed)
Ms. Ewell feels that she has a Sinus Infection and she is hoping to get an antibiotic.  She did not want to make an appt, because it would be next Tuesday before Dr. Katrinka Blazing could see her and she did not want to see anyone else, but she did agree to be seen next Tuesday morning.  She would like an Rx if possible and then would cancel the appt.

## 2011-07-11 NOTE — Telephone Encounter (Signed)
Pt informed and put on crosscover list. Bright Spielmann, [name]

## 2011-07-11 NOTE — Telephone Encounter (Signed)
I am sorry but she should be seen could you call and tell her that or she could go to urgent care or workin and get the antibiotic earlier.  I am in clinic tomorrow, we could put her in work in clinic and if I have time I will see her.

## 2011-07-12 ENCOUNTER — Ambulatory Visit (INDEPENDENT_AMBULATORY_CARE_PROVIDER_SITE_OTHER): Payer: Medicaid Other | Admitting: Family Medicine

## 2011-07-12 ENCOUNTER — Encounter: Payer: Self-pay | Admitting: Family Medicine

## 2011-07-12 DIAGNOSIS — R05 Cough: Secondary | ICD-10-CM

## 2011-07-12 DIAGNOSIS — J019 Acute sinusitis, unspecified: Secondary | ICD-10-CM | POA: Insufficient documentation

## 2011-07-12 MED ORDER — AZITHROMYCIN 250 MG PO TABS: 500.0000 mg | ORAL_TABLET | Freq: Every day | ORAL | Status: DC

## 2011-07-12 MED ORDER — GUAIFENESIN-CODEINE 100-10 MG/5ML PO SYRP: 5.0000 mL | ORAL_SOLUTION | Freq: Three times a day (TID) | ORAL | Status: AC | PRN

## 2011-07-12 NOTE — Assessment & Plan Note (Signed)
See RX

## 2011-07-12 NOTE — Patient Instructions (Signed)
It was great to see you today!  Schedule an appointment to see your PCP as needed.  You have a sinus infection. I will prescribe you antibiotics and cough medication.

## 2011-07-12 NOTE — Progress Notes (Signed)
  Subjective:   1. Sinusitis  Michelle [name] is a 61 y.o. female who presents for evaluation of sinus pain. Symptoms include: congestion, cough, facial pain, foul rhinorrhea and headaches. Onset of symptoms was 3 days ago. Symptoms have been gradually worsening since that time. Past history is significant for no history of pneumonia or bronchitis. Patient is a non-smoker. No sick contacts.  Review of Systems Pertinent items are noted in HPI. No fever, chills, night sweats, weight loss.   Objective:   Filed Vitals:   07/12/11 0959  BP: 132/89  Pulse: 89  Temp: 97.6 F (36.4 C)  TempSrc: Oral  Height: 5\' 3"  (1.6 m)  Weight: 179 lb (81.194 kg)  Lungs:  Normal respiratory effort, chest expands symmetrically. Lungs are clear to auscultation, no crackles or wheezes. Throat: normal mucosa, no exudate, uvula midline. Erythema present Neck:  No deformities, thyromegaly, masses, or tenderness noted.   Supple with full range of motion without pain. Face: tender palpation of maxillary and frontal sinuses. Mouth - no lesions, mucous membranes are moist, no decaying teeth  Nose:  External nasal examination shows no deformity or inflammation. Nasal mucosa are dry. No septal dislocation or dislocation.No obstruction to airflow.   Assessment:

## 2011-07-12 NOTE — Assessment & Plan Note (Signed)
Antibiotics for sinus pain and purulent discharge. No lung involvement.

## 2011-07-16 ENCOUNTER — Ambulatory Visit: Payer: Medicaid Other | Admitting: Family Medicine

## 2011-07-21 ENCOUNTER — Other Ambulatory Visit: Payer: Self-pay | Admitting: Family Medicine

## 2011-07-21 NOTE — Telephone Encounter (Signed)
Refill request

## 2011-08-08 ENCOUNTER — Encounter: Payer: Self-pay | Admitting: Family Medicine

## 2011-08-08 ENCOUNTER — Ambulatory Visit (INDEPENDENT_AMBULATORY_CARE_PROVIDER_SITE_OTHER): Payer: Medicaid Other | Admitting: Family Medicine

## 2011-08-08 DIAGNOSIS — E785 Hyperlipidemia, unspecified: Secondary | ICD-10-CM

## 2011-08-08 DIAGNOSIS — M810 Age-related osteoporosis without current pathological fracture: Secondary | ICD-10-CM

## 2011-08-08 DIAGNOSIS — M069 Rheumatoid arthritis, unspecified: Secondary | ICD-10-CM

## 2011-08-08 DIAGNOSIS — L570 Actinic keratosis: Secondary | ICD-10-CM

## 2011-08-08 DIAGNOSIS — J329 Chronic sinusitis, unspecified: Secondary | ICD-10-CM

## 2011-08-08 DIAGNOSIS — M8580 Other specified disorders of bone density and structure, unspecified site: Secondary | ICD-10-CM | POA: Insufficient documentation

## 2011-08-08 DIAGNOSIS — F411 Generalized anxiety disorder: Secondary | ICD-10-CM

## 2011-08-08 DIAGNOSIS — E119 Type 2 diabetes mellitus without complications: Secondary | ICD-10-CM

## 2011-08-08 HISTORY — DX: Age-related osteoporosis without current pathological fracture: M81.0

## 2011-08-08 MED ORDER — OXYCODONE-ACETAMINOPHEN 10-325 MG PO TABS: 1.0000 | ORAL_TABLET | Freq: Four times a day (QID) | ORAL | Status: DC | PRN

## 2011-08-08 MED ORDER — AMOXICILLIN-POT CLAVULANATE 875-125 MG PO TABS: 1.0000 | ORAL_TABLET | Freq: Two times a day (BID) | ORAL | Status: AC

## 2011-08-08 MED ORDER — KETOROLAC TROMETHAMINE 60 MG/2ML IM SOLN
60.0000 mg | Freq: Once | INTRAMUSCULAR | Status: AC
  Administered 2011-08-08: 60 mg via INTRAMUSCULAR

## 2011-08-08 MED ORDER — CLONAZEPAM 1 MG PO TABS: 1.0000 mg | ORAL_TABLET | Freq: Three times a day (TID) | ORAL | Status: DC | PRN

## 2011-08-08 NOTE — Assessment & Plan Note (Signed)
Patient had cryotherapy of greater than 5 different places on her face mostly for head as well as nose. Patient told about potential blistering wearing sunscreen and when to seek medical attention.

## 2011-08-08 NOTE — Assessment & Plan Note (Signed)
Patient appears to be fairly stable A1c had declined back to 7.7 last time she is due again in 3 weeks. Based on that will make a change. She has not been taking the Amaryl a regular basis secondary to hypoglycemic events.

## 2011-08-08 NOTE — Progress Notes (Signed)
Addended by: Judi Saa on: 08/08/2011 10:09 AM   Modules accepted: Level of Service

## 2011-08-08 NOTE — Patient Instructions (Signed)
It is good to see you. I am refilling your oxycodone and her Klonopin. I will give you 3 months worth. Please see a rheumatologist I'm hoping he'll make some changes. I when she to come back at the beginning of March for a lab draw. I will call you when I get those results. Come see me when you need me otherwise I need to see you again in 3 months time.

## 2011-08-08 NOTE — Progress Notes (Deleted)
  Subjective:    Patient ID: Michelle [name], female    DOB: 1951-05-01, 61 y.o.   MRN: [id number]  HPI    Review of Systems     Objective:   Physical Exam        Assessment & Plan:

## 2011-08-08 NOTE — Assessment & Plan Note (Signed)
Patient continues to have problems has swelling of multiple joints today which is probably worse than that seen her in a very long time. Patient has a followup appointment in 2 weeks with her rheumatologist discussed with her that still has room on the Plaquenil to increase and maybe they will change her management considerably at next office visit. This does not seem to be improving at this time. We did refill her her oxycodone for pain control to allow her to continue doing her activities of daily living.

## 2011-08-08 NOTE — Progress Notes (Signed)
  Subjective:    Patient ID: Michelle [name], female    DOB: [birth year], 61 y.o.   MRN: [id number]  HPI   Left shoulder rotator cuff repair: Still having pain and had surgery at [facility] x2 over the course of the last 8 months. Patient though is doing somewhat better overall had done and completed physical therapy has followup appointment with [facility] in the near future. Patient though is still having chronic joint pain secondary to her rheumatoid arthritis.  Rheumatoid arthritis patient has been seen by a rheumatologist at [facility]. Patient is taking Plaquenil 200 mg twice a day as well as prednisone 10 mg daily she has not seen much improvement at this time and she states that they may be starting her on a new medication here in the near future. Patient does get oxycodone from me which allows her to do her activities of daily living and stay very active such as cleaning her house doing her errands and visiting her sister who is in the hospital.   Diabetes:  High at home: 174 Low at home: 19 Taking medications:yes Side effects:no ROS: denies fever, chills, dizziness, loss of conscieness, polyuria poly dipsia numbness or tingling in extremities or chest pain. Lab Results  Component Value Date   HGBA1C 7.5 05/28/2011    Hypertension Blood pressure at home: Not checking Blood pressure today: Not checking Taking Meds: Yes Side effects: No ROS: Denies headache visual changes nausea, vomiting, chest pain or abdominal pain or shortness of breath.  Anxiety- patient has been very anxious patient's sister and Michelle [name] is very sick in the hospital again she is very concerned.  Review of Systems  Denies fever, chills, nausea, vomiting, abdominal pain. Past medical history, social, surgical and family history all reviewed.     Objective:   Physical Exam  vitals reviewed General: No apparent distress Cardiovascular: Regular rate and rhythm no murmur Pulmonary: Clear to auscultation bilaterally Abdomen  bowel sounds positive nontender nondistended  Skin: Patient does have a case on face more than usual after verbal and written consent patient did have liquid nitrogen to these areas. Discussed care afterwards Assessment & Plan:

## 2011-08-08 NOTE — Assessment & Plan Note (Signed)
Still unable at this time to decrease the amount of Klonopin patient's sister is in the hospital again who has lung cancer and patient is not coping well. Patient is also had increase her prednisone from time to time which I think is also make her a little more nervous. Patient is also gaining weight which is not good. Patient will need to continue her Klonopin at this time would like to titrate her down to maybe 2.5 mg 4 times a day in the future and see if this helps. We'll discuss this with patient in 3 months time.

## 2011-09-05 ENCOUNTER — Other Ambulatory Visit (INDEPENDENT_AMBULATORY_CARE_PROVIDER_SITE_OTHER): Payer: Medicaid Other

## 2011-09-05 DIAGNOSIS — E785 Hyperlipidemia, unspecified: Secondary | ICD-10-CM

## 2011-09-05 DIAGNOSIS — E119 Type 2 diabetes mellitus without complications: Secondary | ICD-10-CM

## 2011-09-05 DIAGNOSIS — M810 Age-related osteoporosis without current pathological fracture: Secondary | ICD-10-CM

## 2011-09-05 LAB — COMPREHENSIVE METABOLIC PANEL
ALT: 12 U/L (ref 0–35)
Alkaline Phosphatase: 53 U/L (ref 39–117)
CO2: 26 mEq/L (ref 19–32)
Creat: 0.62 mg/dL (ref 0.50–1.10)
Glucose, Bld: 100 mg/dL — ABNORMAL HIGH (ref 70–99)
Sodium: 141 mEq/L (ref 135–145)
Total Bilirubin: 0.3 mg/dL (ref 0.3–1.2)
Total Protein: 6.5 g/dL (ref 6.0–8.3)

## 2011-09-05 LAB — LIPID PANEL
Cholesterol: 120 mg/dL (ref 0–200)
HDL: 57 mg/dL (ref >39)
Total CHOL/HDL Ratio: 2.1 Ratio
Triglycerides: 91 mg/dL (ref <150)

## 2011-09-05 LAB — POCT GLYCOSYLATED HEMOGLOBIN (HGB A1C): Hemoglobin A1C: 7.2

## 2011-09-05 NOTE — Progress Notes (Unsigned)
Cmp,lipid ,vit d and a1c done today Michelle [name]

## 2011-09-06 LAB — VITAMIN D 25 HYDROXY (VIT D DEFICIENCY, FRACTURES): Vit D, 25-Hydroxy: 34 ng/mL (ref 30–89)

## 2011-09-09 ENCOUNTER — Telehealth: Payer: Self-pay | Admitting: Family Medicine

## 2011-09-09 NOTE — Telephone Encounter (Signed)
Patient is calling to speak to a nurse about her stomache symptoms.

## 2011-09-09 NOTE — Telephone Encounter (Signed)
Returned call to Michelle [name].  Ate a bagel this morning and feels like her food is "stuck in her esophagus."  Has GERD and takes Nexium.      Michelle [name] is able to swallow saliva, but "feels like it would be better if she could make herself throw up."  Michelle [name] wants to see Dr. Katrinka Blazing to see if she needs GI referral for possible stricture.  Appt scheduled for tomorrow with Dr. Katrinka Blazing at 9:45 am.  Informed Michelle [name] to go to urgent care or emergency room if sxs worsen and she is unable to wait until tomorrow.  Michelle [name] verbalized understanding.  Gaylene Brooks, RN

## 2011-09-10 ENCOUNTER — Encounter: Payer: Self-pay | Admitting: Family Medicine

## 2011-09-10 ENCOUNTER — Ambulatory Visit (INDEPENDENT_AMBULATORY_CARE_PROVIDER_SITE_OTHER): Payer: Medicaid Other | Admitting: Family Medicine

## 2011-09-10 ENCOUNTER — Telehealth: Payer: Self-pay | Admitting: *Deleted

## 2011-09-10 VITALS — BP 134/82 | HR 80 | Temp 98.4°F | Ht 63.0 in | Wt 180.4 lb

## 2011-09-10 DIAGNOSIS — R1310 Dysphagia, unspecified: Secondary | ICD-10-CM

## 2011-09-10 NOTE — Telephone Encounter (Signed)
Pt doesn't want to see Dr Marjorie Smolder wants to go to another GI office.

## 2011-09-10 NOTE — Telephone Encounter (Signed)
Spoke with pt.  Per Dr. Octaviano Glow office she must call and speak with billing before appt can be made.  Pt informed and agreeable.  She will call back to let us know when she has spoken with them and had everything cleared up. Imraan Wendell, [name]

## 2011-09-10 NOTE — Patient Instructions (Signed)
Good to see you. I when she to increase her Nexium to 2 times a day for the next week then go back to one time daily. I'm going to send you to see gastroenterology. He will be getting a call from [place] soon. Come back in one month so we can discuss your pain medications.

## 2011-09-10 NOTE — Assessment & Plan Note (Signed)
Patient gives history of having trouble swallowing previously and was found to have an esophageal stricture. Patient could potentially be having an exacerbation or recurrence of this problem. Patient will be sent to GI for potential EGD with patient having these symptoms. Patient has no weight loss surgeon at concerned that this is a potential for cancer at this time. Patient does have a long-standing history of reflux and potentially have some Barrett's esophagus versus a hiatal hernia. These are low likelihood though and will leave GI to evaluate more clearly. Increase patient's Nexium to 2 times day.  Patient has seen Dr. Matthias Hughs previously Followup next month for chronic pain symptoms and rheumatoid arthritis

## 2011-09-10 NOTE — Progress Notes (Signed)
  Subjective:    Patient ID: Michelle [name], female    DOB: 05/19/1951, 61 y.o.   MRN: [id number]  HPI 61 year old female with history of rheumatoid arthritis as well as esophageal strictures coming in with trouble swallowing. Patient states that last few months she has had trouble swallowing certain things especially bread and serial. Patient states also that seems that she reflexes up ice cream. She's had these problems in the past but has started increasing frequency and starting to have more of a burning sensation in her esophageal/chest region. Patient denies any fevers chills or any weight loss. Patient does take a PPI on a regular schedule pattern. Patient states that she does have some of her cough but no blood. Patient states that this sensation has kept her up at night now as well. Of note patient is taking prednisone as well as Plaquenil on a regular basis. She is now on prednisone 20 mg daily and Plaquenil 200 mg twice a day   Review of Systems As stated above the patient denies any weight loss, fevers, chills, or abdominal pain out of the ordinary. She also denies any diarrhea or constipation.    Objective:   Physical Exam General: No apparent distress Cardiovascular: Regular in rhythm no murmur appreciated Pulmonary: Clear to auscultation bilaterally HEENT: Moist these membranes patient does have a little bit of erythema of the posterior pharynx no postnasal drip noted. Abdomen: Bowel sounds positive nontender nondistended. Patient is minimally tender in the mid epigastric region.    Assessment & Plan:

## 2011-09-11 NOTE — Telephone Encounter (Signed)
Pt states that she will come here and sign a ROI to have notes faxed from Dr. Haig Prophet office.  We will refer after these notes are received. Rome Schlauch, [name]

## 2011-09-12 ENCOUNTER — Other Ambulatory Visit: Payer: Self-pay | Admitting: Family Medicine

## 2011-09-13 NOTE — Telephone Encounter (Signed)
Refill request

## 2011-09-16 ENCOUNTER — Encounter: Payer: Self-pay | Admitting: Gastroenterology

## 2011-09-17 ENCOUNTER — Ambulatory Visit: Payer: Medicaid Other | Admitting: Family Medicine

## 2011-10-01 ENCOUNTER — Ambulatory Visit (INDEPENDENT_AMBULATORY_CARE_PROVIDER_SITE_OTHER): Payer: Medicaid Other | Admitting: Family Medicine

## 2011-10-01 ENCOUNTER — Encounter: Payer: Self-pay | Admitting: Family Medicine

## 2011-10-01 VITALS — BP 128/82 | HR 84 | Temp 98.3°F | Ht 63.0 in | Wt 181.0 lb

## 2011-10-01 DIAGNOSIS — F411 Generalized anxiety disorder: Secondary | ICD-10-CM

## 2011-10-01 DIAGNOSIS — S6992XA Unspecified injury of left wrist, hand and finger(s), initial encounter: Secondary | ICD-10-CM

## 2011-10-01 DIAGNOSIS — S6990XA Unspecified injury of unspecified wrist, hand and finger(s), initial encounter: Secondary | ICD-10-CM

## 2011-10-01 DIAGNOSIS — Z23 Encounter for immunization: Secondary | ICD-10-CM

## 2011-10-01 DIAGNOSIS — M069 Rheumatoid arthritis, unspecified: Secondary | ICD-10-CM

## 2011-10-01 MED ORDER — OXYCODONE-ACETAMINOPHEN 10-325 MG PO TABS: 1.0000 | ORAL_TABLET | Freq: Four times a day (QID) | ORAL | Status: DC | PRN

## 2011-10-01 MED ORDER — FLUCONAZOLE 150 MG PO TABS: 150.0000 mg | ORAL_TABLET | Freq: Once | ORAL | Status: AC

## 2011-10-01 MED ORDER — CLONAZEPAM 1 MG PO TABS: 1.0000 mg | ORAL_TABLET | Freq: Three times a day (TID) | ORAL | Status: DC | PRN

## 2011-10-01 MED ORDER — AMOXICILLIN-POT CLAVULANATE 875-125 MG PO TABS: 1.0000 | ORAL_TABLET | Freq: Two times a day (BID) | ORAL | Status: DC

## 2011-10-01 NOTE — Assessment & Plan Note (Signed)
Patient has had recent deaths in her family which has increased her anxiety. This is not the time to attempt to titrate down medications. Refilled her Klonopin for 3 months worth patient will return to me at the end of June before I graduate residency.

## 2011-10-01 NOTE — Assessment & Plan Note (Signed)
Refill pain medications continue current therapy. Patient seems to be doing overall about her baseline.

## 2011-10-01 NOTE — Patient Instructions (Signed)
Once again, I am so sorry for your loss. If you need anything you know that I'm here for you. I have refilled your Percocet and Klonopin. I have sent in a prescription for an antibiotic for you. I also sent you in a prescription for Diflucan in case you to a yeast infection secondary to antibiotic. I'm giving you a tetanus shot today. I will see you again in June.

## 2011-10-01 NOTE — Assessment & Plan Note (Signed)
Patient does have a puncture wound seems to be potentially infected. At this time we will treat Augmentin to be safe. Patient also received tetanus vaccine today with it being greater than 7 years since her last immunization. Patient will followup as needed nose a red flags to look out for.

## 2011-10-01 NOTE — Progress Notes (Signed)
Patient ID: Michelle [name], female   DOB: 1950/12/11, 61 y.o.   MRN: [id number]  Subjective:    Patient ID: Michelle [name], female    DOB: [birth year], 61 y.o.   MRN: [id number]  HPI  Left hand injury-patient was helping husband clear out an old [place] and had the dorsal aspect of her hand tight with a goal to rest the finger. This occurred approximately 2 days ago has started to get very red and swollen. Patient denies any fevers or chills last tetanus shot was in 2006. Patient able to use the hand minorly sore but overall no disabling of the hand.  Rheumatoid arthritis patient has been seen by a rheumatologist at [facility]. Patient is taking Plaquenil 200 mg twice a day as well as prednisone 10 mg daily and she is starting methotrexate again. Patient states that she is near her baseline but has not noticed much improvement.  Patient does get oxycodone from me which allows her to do her activities of daily living and stay very active such as cleaning her house doing her errands and helping her husband out of work. Had attempted to decrease her Percocet on number of occasions but was unable to do so. Do not feel that patient is dependent.   Diabetes:  High at home: 174 Low at home: 85 Taking medications:yes Side effects:no ROS: denies fever, chills, dizziness, loss of conscieness, polyuria poly dipsia numbness or tingling in extremities or chest pain. Lab Results  Component Value Date   HGBA1C 7.2 09/05/2011    Hypertension Blood pressure at home: Not checking Blood pressure today: 128/82 Taking Meds: Yes Side effects: No ROS: Denies headache visual changes nausea, vomiting, chest pain or abdominal pain or shortness of breath.  Anxiety- patient has been very anxious patient's sister who had been sick a very long time recently passed away. In addition to that she's had 6 other people relatively close to her in the course of the last month will die. Patient is finding it hard to get along  without the Klonopin. Patient though states that she is still able to do her activities of daily living denies any suicidal or homicidal ideation and feels overall that she is coping well.  Review of Systems Denies fever, chills, nausea, vomiting, abdominal pain. Past medical history, social, surgical and family history all reviewed.     Objective:   Physical Exam vitals reviewed General: No apparent distress Cardiovascular: Regular rate and rhythm no murmur Pulmonary: Clear to auscultation bilaterally Abdomen bowel sounds positive nontender nondistended  Left hand-patient has area on dorsal aspect where well healing puncture wounds occurred no signs of discharge. Patient does have granulation tissue over the area. She does have a we'll of erythema surrounding it minorly tender to palpation minimal edema the patient is able to extend and flex all fingers no pain over the bone or any signs of tenosynovitis. Good capillary refill distally. Assessment & Plan:

## 2011-10-09 ENCOUNTER — Ambulatory Visit (INDEPENDENT_AMBULATORY_CARE_PROVIDER_SITE_OTHER): Payer: Medicaid Other | Admitting: Gastroenterology

## 2011-10-09 ENCOUNTER — Encounter: Payer: Self-pay | Admitting: Gastroenterology

## 2011-10-09 DIAGNOSIS — R1310 Dysphagia, unspecified: Secondary | ICD-10-CM | POA: Insufficient documentation

## 2011-10-09 NOTE — Patient Instructions (Addendum)
Recall colonoscopy January 2022 for average risk screening. You will be set up for an upper endoscopy at [facility] with balloon dilation. Stay on nexium twice daily.

## 2011-10-09 NOTE — Progress Notes (Signed)
HPI: This is a  very pleasant 61 year old woman whom I am meeting for the first time today.   Has dyphagia to solids only for quite a while, but getting more progressive.  She is on nexium for a long time for years (other PPIs as well). Double this to twice daily 2-3 weeks ago.  This has not helped her swallowing.   Overall she has gained a few pounds lately.  No overt GI bleeding.   Was on abx a couple weeks ago.     EGD 2006 with Dr.  Matthias Hughs: This was done for Hemoccult-positive stool. He wrote about bile reflux into stomach and esophagus, possible minimal Barrett's esophagus. Biopsies of the GE junction showed mild inflammation only without any intestinal metaplasia.  Colonoscopy January 2012 findings diverticulosis but was otherwise normal. Dr. Matthias Hughs recommended she have a repeat colonoscopy at 10 year interval for her routine risk screening.     Review of systems: Pertinent positive and negative review of systems were noted in the above HPI section. Complete review of systems was performed and was otherwise normal.    Past Medical History  Diagnosis Date  . RA (rheumatoid arthritis)     CCP positive followed by [facility]  . HLD (hyperlipidemia)   . Diabetes mellitus   . Asthma     Past Surgical History  Procedure Date  . Appendectomy   . Cholecystectomy   . Abdominal hysterectomy   . Carpal tunnel release   . Knee surgery     x2  . Hernia repair   . Shoulder surgery     x2    Current Outpatient Prescriptions  Medication Sig Dispense Refill  . aspirin 81 MG chewable tablet Chew 81 mg by mouth daily.        . cetirizine (ZYRTEC) 10 MG tablet TAKE 1 TABLET AT BEDTIME  31 tablet  3  . citalopram (CELEXA) 40 MG tablet TAKE 1 TABLET DAILY  30 tablet  6  . clonazePAM (KLONOPIN) 1 MG tablet Take 1 tablet (1 mg total) by mouth 3 (three) times daily as needed for anxiety. Do not fill until 60 days after date printed.  90 tablet  0  . clonazePAM (KLONOPIN) 1 MG tablet Take 1  tablet (1 mg total) by mouth 3 (three) times daily as needed for anxiety. Do not fill until 30 days after date printed.  90 tablet  0  . clonazePAM (KLONOPIN) 1 MG tablet Take 1 tablet (1 mg total) by mouth 3 (three) times daily as needed.  90 tablet  0  . CRESTOR 10 MG tablet TAKE 1 TABLET AT BEDTIME  90 tablet  2  . fluticasone (FLONASE) 50 MCG/ACT nasal spray USE 2 SPRAYS INTO THE NOSE DAILY.  16 g  6  . glucose blood (ACCU-CHEK ACTIVE STRIPS) test strip Use as instructed  100 each  12  . hydroxychloroquine (PLAQUENIL) 200 MG tablet Take 200 mg by mouth 2 (two) times daily.        . metFORMIN (GLUCOPHAGE) 1000 MG tablet TAKE 1 TABLET (1,000 MG TOTAL) BY MOUTH 2 (TWO) TIMES DAILY WITH A MEAL.  60 tablet  3  . NEXIUM 40 MG capsule TAKE 1 CAPSULE BY MOUTH DAILY  90 capsule  1  . nitroGLYCERIN (NITROSTAT) 0.4 MG SL tablet Place 0.4 mg under the tongue every 5 (five) minutes as needed.        Marland Kitchen oxyCODONE-acetaminophen (PERCOCET) 10-325 MG per tablet Take 1 tablet by mouth every 6 (  six) hours as needed.  120 tablet  0  . VENTOLIN HFA 108 (90 BASE) MCG/ACT inhaler INHALE 2 PUFFS INTO THE LUNGS EVERY 4 (FOUR) HOURS AS NEEDED FOR WHEEZING.  18 g  6  . oxyCODONE-acetaminophen (PERCOCET) 10-325 MG per tablet Take 1 tablet by mouth every 6 (six) hours as needed for pain. Do not fill until 60 days after date printed.  120 tablet  0  . oxyCODONE-acetaminophen (PERCOCET) 10-325 MG per tablet Take 1 tablet by mouth every 6 (six) hours as needed for pain. Do not fill until 30 days from date it was printed.  120 tablet  0  . predniSONE (DELTASONE) 20 MG tablet Take 5 mg by mouth daily. Take 2 tablets by mouth daily for the next 5 days       . tiotropium (SPIRIVA HANDIHALER) 18 MCG inhalation capsule Place 1 capsule (18 mcg total) into inhaler and inhale daily.  30 capsule  12    Allergies as of 10/09/2011 - Review Complete 10/09/2011  Allergen Reaction Noted  . Sulfonamide derivatives Hives     Family History   Problem Relation Age of Onset  . Stroke Father 62    died  . Lung cancer Brother 49    died  . Rheum arthritis Sister 67  . Cancer Sister     breast  . Cancer Daughter     colon    History   Social History  . Marital Status: Married    Spouse Name: N/A    Number of Children: 3  . Years of Education: N/A   Occupational History  . disabled   .     Social History Main Topics  . Smoking status: Former Smoker    Types: Cigarettes    Quit date: 05/31/1994  . Smokeless tobacco: Never Used  . Alcohol Use: No  . Drug Use: No  . Sexually Active: Not on file   Other Topics Concern  . Not on file   Social History Narrative   Pt is on MAP program [place]ots of family stressors including grandaughter who lives with her       Physical Exam: BP 124/78  Pulse 72  Ht 5\' 3"  (1.6 m)  Wt 178 lb 9.6 oz (81.012 kg)  BMI 31.64 kg/m2 Constitutional: generally well-appearing Psychiatric: alert and oriented x3 Eyes: extraocular movements intact Mouth: oral pharynx moist, no lesions, no thrush  Neck: supple no lymphadenopathy Cardiovascular: heart regular rate and rhythm Lungs: clear to auscultation bilaterally Abdomen: soft, nontender, nondistended, no obvious ascites, no peritoneal signs, normal bowel sounds Extremities: no lower extremity edema bilaterally Skin: no lesions on visible extremities    Assessment and plan: 61 y.o. female with  mildly progressive solid food only dysphagia without other alarm symptoms  We will proceed with EGD and balloon dilation if indicated. These will be done at her soonest convenience. I see no reason for any further blood tests or imaging studies prior to that.

## 2011-10-10 ENCOUNTER — Telehealth: Payer: Self-pay | Admitting: *Deleted

## 2011-10-10 ENCOUNTER — Other Ambulatory Visit: Payer: Self-pay | Admitting: Family Medicine

## 2011-10-10 NOTE — Telephone Encounter (Signed)
PA required for Crestor. Dr. Katrinka Blazing filled out form and it ws faxed to [facility].

## 2011-10-11 NOTE — Telephone Encounter (Signed)
Approval  received from medicaid  for Crestor. Pharmacy notified. 

## 2011-10-15 ENCOUNTER — Ambulatory Visit (AMBULATORY_SURGERY_CENTER): Payer: Medicaid Other | Admitting: Gastroenterology

## 2011-10-15 ENCOUNTER — Encounter: Payer: Self-pay | Admitting: Gastroenterology

## 2011-10-15 VITALS — BP 132/72 | HR 86 | Temp 96.4°F | Resp 20 | Ht 63.0 in | Wt 178.0 lb

## 2011-10-15 DIAGNOSIS — R1310 Dysphagia, unspecified: Secondary | ICD-10-CM

## 2011-10-15 DIAGNOSIS — K2960 Other gastritis without bleeding: Secondary | ICD-10-CM

## 2011-10-15 DIAGNOSIS — K2970 Gastritis, unspecified, without bleeding: Secondary | ICD-10-CM

## 2011-10-15 DIAGNOSIS — K3184 Gastroparesis: Secondary | ICD-10-CM

## 2011-10-15 LAB — GLUCOSE, CAPILLARY
Glucose-Capillary: 129 mg/dL — ABNORMAL HIGH (ref 70–99)
Glucose-Capillary: 112 mg/dL — ABNORMAL HIGH (ref 70–99)

## 2011-10-15 MED ORDER — SODIUM CHLORIDE 0.9 % IV SOLN: 500.0000 mL | INTRAVENOUS | Status: DC

## 2011-10-15 NOTE — Patient Instructions (Signed)
YOU HAD AN ENDOSCOPIC PROCEDURE TODAY AT THE [facility]: Refer to the procedure report that was given to you for any specific questions about what was found during the examination.  If the procedure report does not answer your questions, please call your gastroenterologist to clarify.  If you requested that your care partner not be given the details of your procedure findings, then the procedure report has been included in a sealed envelope for you to review at your convenience later.  YOU SHOULD EXPECT: Some feelings of bloating in the abdomen. Passage of more gas than usual.  Walking can help get rid of the air that was put into your GI tract during the procedure and reduce the bloating. If you had a lower endoscopy (such as a colonoscopy or flexible sigmoidoscopy) you may notice spotting of blood in your stool or on the toilet paper. If you underwent a bowel prep for your procedure, then you may not have a normal bowel movement for a few days.  DIET: Your first meal following the procedure should be a light meal and then it is ok to progress to your normal diet.  A half-sandwich or bowl of soup is an example of a good first meal.  Heavy or fried foods are harder to digest and may make you feel nauseous or bloated.  Likewise meals heavy in dairy and vegetables can cause extra gas to form and this can also increase the bloating.  Drink plenty of fluids but you should avoid alcoholic beverages for 24 hours.  ACTIVITY: Your care partner should take you home directly after the procedure.  You should plan to take it easy, moving slowly for the rest of the day.  You can resume normal activity the day after the procedure however you should NOT DRIVE or use heavy machinery for 24 hours (because of the sedation medicines used during the test).    SYMPTOMS TO REPORT IMMEDIATELY: A gastroenterologist can be reached at any hour.  During normal business hours, 8:30 AM to 5:00 PM Monday through Friday,  call [phone].  After hours and on weekends, please call the GI answering service at [phone] who will take a message and have the physician on call contact you.   Following lower endoscopy (colonoscopy or flexible sigmoidoscopy):  Excessive amounts of blood in the stool  Significant tenderness or worsening of abdominal pains  Swelling of the abdomen that is new, acute  Fever of 100F or higher  Following upper endoscopy (EGD)  Vomiting of blood or coffee ground material  New chest pain or pain under the shoulder blades  Painful or persistently difficult swallowing  New shortness of breath  Fever of 100F or higher  Black, tarry-looking stools  FOLLOW UP: If any biopsies were taken you will be contacted by phone or by letter within the next 1-3 weeks.  Call your gastroenterologist if you have not heard about the biopsies in 3 weeks.  Our staff will call the home number listed on your records the next business day following your procedure to check on you and address any questions or concerns that you may have at that time regarding the information given to you following your procedure. This is a courtesy call and so if there is no answer at the home number and we have not heard from you through the emergency physician on call, we will assume that you have returned to your regular daily activities without incident.  SIGNATURES/CONFIDENTIALITY: You and/or your care   partner have signed paperwork which will be entered into your electronic medical record.  These signatures attest to the fact that that the information above on your After Visit Summary has been reviewed and is understood.  Full responsibility of the confidentiality of this discharge information lies with you and/or your care-partner.  

## 2011-10-15 NOTE — Progress Notes (Signed)
Patient did not experience any of the following events: a burn prior to discharge; a fall within the facility; wrong site/side/patient/procedure/implant event; or a hospital transfer or hospital admission upon discharge from the facility. (G8907) Patient did not have preoperative order for IV antibiotic SSI prophylaxis. (G8918)  

## 2011-10-15 NOTE — Op Note (Signed)
[facility] [address] [place], [place]  [address]  ENDOSCOPY PROCEDURE REPORT  PATIENT:  Michelle [name], Michelle [name]  MR#:  [id number] BIRTHDATE:  1951/06/01, 60 yrs. old  GENDER:  female ENDOSCOPIST:  Rachael Fee, MD Referred by:  Leonette Most Record, M.D. PROCEDURE DATE:  10/15/2011 PROCEDURE:  EGD with biopsy, 43239 ASA CLASS:  Class II INDICATIONS:  dysphagia MEDICATIONS:   Fentanyl 50 mcg IV, These medications were titrated to patient response per physician's verbal order, Versed 5 mg IV TOPICAL ANESTHETIC:  Cetacaine Spray DESCRIPTION OF PROCEDURE:   After the risks benefits and alternatives of the procedure were thoroughly explained, informed consent was obtained.  The LB [id number] endoscope was introduced through the mouth and advanced to the second portion of the duodenum, without limitations.  The instrument was slowly withdrawn as the mucosa was fully examined.  <<PROCEDUREIMAGES>> There was mild, non-specific gastritis. Biopsies were taken and sent to pathology (jar 1) (see image4). There was a moderate amount of retained solid food in stomach (see image5 and image1). Otherwise the examination was normal (see image7, image6, and image3).    Retroflexed views revealed no abnormalities.    The scope was then withdrawn from the patient and the procedure completed. COMPLICATIONS:  None  ENDOSCOPIC IMPRESSION: 1) Mild gastritis that was biopsied to check for H. pylori 2) Retained solid food without anatomic obstruction. This is from gastroparesis (slow stomach emptying) which is probably due to diabetes.  Narcotic pain medicines contribute as well. 3) Otherwise normal examination  RECOMMENDATIONS: For your GASTROPARESIS: You need to begin eating smaller, more frequent meals (4-5 small meals a day rather than 2-3 larger ones).  Keep blood sugars as normal as possible. Try to cut back on narcotic pain medicines as much as possible. Return to see Dr. Christella Hartigan  in the office in 5-6 weeks to discuss how this dietary change has helped.  ______________________________ Rachael Fee, MD  n. eSIGNED:   Rachael Fee at 10/15/2011 08:36 AM  Judith Part, [phone]

## 2011-10-16 ENCOUNTER — Telehealth: Payer: Self-pay | Admitting: *Deleted

## 2011-10-16 NOTE — Telephone Encounter (Signed)
  Follow up Call-  Call back number 10/15/2011  Post procedure Call Back phone  # [phone] hm   Permission to leave phone message Yes     Patient questions:  Do you have a fever, pain , or abdominal swelling? no Pain Score  0 *  Have you tolerated food without any problems? yes  Have you been able to return to your normal activities? yes  Do you have any questions about your discharge instructions: Diet   no Medications  no Follow up visit  no  Do you have questions or concerns about your Care? no  Actions: * If pain score is 4 or above: No action needed, pain <4.

## 2011-10-22 ENCOUNTER — Encounter: Payer: Self-pay | Admitting: Gastroenterology

## 2011-11-04 ENCOUNTER — Other Ambulatory Visit: Payer: Self-pay | Admitting: Family Medicine

## 2011-11-14 ENCOUNTER — Other Ambulatory Visit: Payer: Self-pay | Admitting: Family Medicine

## 2011-11-14 ENCOUNTER — Encounter: Payer: Self-pay | Admitting: Family Medicine

## 2011-11-19 ENCOUNTER — Encounter: Payer: Self-pay | Admitting: Gastroenterology

## 2011-11-19 ENCOUNTER — Ambulatory Visit (INDEPENDENT_AMBULATORY_CARE_PROVIDER_SITE_OTHER): Payer: Medicaid Other | Admitting: Gastroenterology

## 2011-11-19 VITALS — BP 114/72 | HR 76 | Ht 63.0 in | Wt 179.2 lb

## 2011-11-19 DIAGNOSIS — Z809 Family history of malignant neoplasm, unspecified: Secondary | ICD-10-CM

## 2011-11-19 DIAGNOSIS — K3184 Gastroparesis: Secondary | ICD-10-CM

## 2011-11-19 DIAGNOSIS — K219 Gastro-esophageal reflux disease without esophagitis: Secondary | ICD-10-CM

## 2011-11-19 HISTORY — DX: Gastroparesis: K31.84

## 2011-11-19 NOTE — Progress Notes (Signed)
Review of pertinent gastrointestinal problems: 1. EGD 2006 with Dr. Matthias Hughs: This was done for Hemoccult-positive stool. He wrote about bile reflux into stomach and esophagus, possible minimal Barrett's esophagus. Biopsies of the GE junction showed mild inflammation only without any intestinal metaplasia.  2. Elevated risk for colon cancer (FH +): Colonoscopy January 2012 findings diverticulosis but was otherwise normal. Dr. Matthias Hughs recommended she have a repeat colonoscopy at 10 year interval for her routine risk screening. Shortly after that her daughter was diagnosed with colon cancer which puts her at increased risk and recall was adjusted to 5 year interval. 3. dysphagia like symptoms.  April 2013 EGD by Dr. Christella Hartigan. Mild gastritis, H. pylori negative on biopsy, retained solid and liquid food without anatomic gastric outlet obstruction, this suggested gastroparesis. DM and daily narcotic medicines likely contribute,  HPI: This is a very pleasant 61 year old woman whom I last saw the time of EGD about a month ago.  She has been grazing, 4-5 times per day. This has helped, she does not feel like she has dysphagia.  Also limits her narcotics as best as possible.  She has also been taking proton pump inhibitor once daily.   She goes 4-5 times a day, bm.  Her oldest daughter had colon cancer.  Past Medical History  Diagnosis Date  . RA (rheumatoid arthritis)     CCP positive followed by [facility]  . HLD (hyperlipidemia)   . Diabetes mellitus   . Asthma   . Allergy   . Anxiety   . Cataract     beginning on left eye  . GERD (gastroesophageal reflux disease)   . Osteoporosis     Past Surgical History  Procedure Date  . Appendectomy   . Cholecystectomy   . Abdominal hysterectomy   . Carpal tunnel release   . Knee surgery     x2  . Hernia repair   . Shoulder surgery     x2  . Right foot bone spurs removed     Current Outpatient Prescriptions  Medication Sig Dispense Refill  . aspirin 81  MG chewable tablet Chew 81 mg by mouth daily.        . cetirizine (ZYRTEC) 10 MG tablet TAKE 1 TABLET AT BEDTIME  31 tablet  3  . citalopram (CELEXA) 40 MG tablet TAKE 1 TABLET DAILY  30 tablet  6  . clonazePAM (KLONOPIN) 1 MG tablet Take 1 tablet (1 mg total) by mouth 3 (three) times daily as needed.  90 tablet  0  . CRESTOR 10 MG tablet TAKE 1 TABLET AT BEDTIME  90 tablet  2  . fluticasone (FLONASE) 50 MCG/ACT nasal spray USE 2 SPRAYS INTO THE NOSE DAILY.  16 g  6  . folic acid (FOLVITE) 1 MG tablet Take 1 mg by mouth daily.      . hydroxychloroquine (PLAQUENIL) 200 MG tablet Take 200 mg by mouth 2 (two) times daily.        . metFORMIN (GLUCOPHAGE) 1000 MG tablet TAKE 1 TABLET (1,000 MG TOTAL) BY MOUTH 2 (TWO) TIMES DAILY WITH A MEAL.  60 tablet  3  . methotrexate (RHEUMATREX) 10 MG tablet Take 10 mg by mouth once a week. Caution: Chemotherapy. Protect from light.      Marland Kitchen NEXIUM 40 MG capsule TAKE 1 CAPSULE BY MOUTH DAILY  90 capsule  1  . nitroGLYCERIN (NITROSTAT) 0.4 MG SL tablet Place 0.4 mg under the tongue every 5 (five) minutes as needed.        Marland Kitchen  oxyCODONE-acetaminophen (PERCOCET) 10-325 MG per tablet Take 1 tablet by mouth every 6 (six) hours as needed for pain. Do not fill until 60 days after date printed.  120 tablet  0  . predniSONE (DELTASONE) 5 MG tablet Take 5 mg by mouth daily.      . VENTOLIN HFA 108 (90 BASE) MCG/ACT inhaler INHALE 2 PUFFS INTO THE LUNGS EVERY 4 (FOUR) HOURS AS NEEDED FOR WHEEZING.  18 g  6  . clonazePAM (KLONOPIN) 1 MG tablet Take 1 tablet (1 mg total) by mouth 3 (three) times daily as needed for anxiety. Do not fill until 60 days after date printed.  90 tablet  0  . glucose blood (ACCU-CHEK ACTIVE STRIPS) test strip Use as instructed  100 each  12  . DISCONTD: clonazePAM (KLONOPIN) 1 MG tablet Take 1 tablet (1 mg total) by mouth 3 (three) times daily as needed for anxiety. Do not fill until 30 days after date printed.  90 tablet  0  . DISCONTD:  oxyCODONE-acetaminophen (PERCOCET) 10-325 MG per tablet Take 1 tablet by mouth every 6 (six) hours as needed.  120 tablet  0  . DISCONTD: oxyCODONE-acetaminophen (PERCOCET) 10-325 MG per tablet Take 1 tablet by mouth every 6 (six) hours as needed for pain. Do not fill until 30 days from date it was printed.  120 tablet  0  . DISCONTD: predniSONE (DELTASONE) 20 MG tablet Take 5 mg by mouth daily. Take 2 tablets by mouth daily for the next 5 days       . DISCONTD: VENTOLIN HFA 108 (90 BASE) MCG/ACT inhaler INHALE 2 PUFFS INTO THE LUNGS EVERY 4 (FOUR) HOURS AS NEEDED FOR WHEEZING.  1 Inhaler  3   Current Facility-Administered Medications  Medication Dose Route Frequency Provider Last Rate Last Dose  . 0.9 %  sodium chloride infusion  500 mL Intravenous Continuous Rachael Fee, MD      . 0.9 %  sodium chloride infusion  500 mL Intravenous Continuous Rachael Fee, MD        Allergies as of 11/19/2011 - Review Complete 11/19/2011  Allergen Reaction Noted  . Sulfonamide derivatives Hives     Family History  Problem Relation Age of Onset  . Stroke Father 21    died  . Lung cancer Brother 49    died  . Rheum arthritis Sister 54  . Cancer Sister     breast  . Cancer Daughter     colon  . Colon cancer Maternal Aunt   . Esophageal cancer Neg Hx   . Rectal cancer Neg Hx   . Stomach cancer Neg Hx     History   Social History  . Marital Status: Married    Spouse Name: N/A    Number of Children: 3  . Years of Education: N/A   Occupational History  . disabled   .     Social History Main Topics  . Smoking status: Former Smoker    Types: Cigarettes    Quit date: 05/31/1994  . Smokeless tobacco: Never Used  . Alcohol Use: No  . Drug Use: No  . Sexually Active: Not on file   Other Topics Concern  . Not on file   Social History Narrative   Pt is on MAP program debra hillLots of family stressors including grandaughter who lives with her      Physical Exam: BP 114/72   Pulse 76  Ht 5\' 3"  (1.6 m)  Wt 179 lb 4  oz (81.307 kg)  BMI 31.75 kg/m2 Constitutional: generally well-appearing Psychiatric: alert and oriented x3 Abdomen: soft, nontender, nondistended, no obvious ascites, no peritoneal signs, normal bowel sounds     Assessment and plan: 61 y.o. female with GERD, gastroparesis, elevated risk for colon cancer  She will continue to limit her narcotics as much as possible. She will continue smaller more frequent meals since this seems to be helping.  I have changed her recall in the system given the new, recent diagnosis of colon cancer in her daughter.

## 2011-11-19 NOTE — Patient Instructions (Addendum)
January 2017 recall colonoscopy for FH of colon cancer (daughter had colon cancer). Continue small, frequent meals since this seems to be helping. Continue the nexium once daily.

## 2011-11-29 ENCOUNTER — Other Ambulatory Visit: Payer: Self-pay | Admitting: Family Medicine

## 2011-12-09 ENCOUNTER — Ambulatory Visit: Payer: Medicaid Other | Admitting: Family Medicine

## 2011-12-17 ENCOUNTER — Other Ambulatory Visit: Payer: Self-pay | Admitting: Family Medicine

## 2011-12-27 ENCOUNTER — Ambulatory Visit (INDEPENDENT_AMBULATORY_CARE_PROVIDER_SITE_OTHER): Payer: Medicaid Other | Admitting: Family Medicine

## 2011-12-27 ENCOUNTER — Encounter: Payer: Self-pay | Admitting: Family Medicine

## 2011-12-27 ENCOUNTER — Telehealth: Payer: Self-pay | Admitting: *Deleted

## 2011-12-27 VITALS — BP 146/88 | HR 86 | Temp 98.4°F | Ht 63.0 in | Wt 178.4 lb

## 2011-12-27 DIAGNOSIS — E119 Type 2 diabetes mellitus without complications: Secondary | ICD-10-CM

## 2011-12-27 DIAGNOSIS — J45909 Unspecified asthma, uncomplicated: Secondary | ICD-10-CM

## 2011-12-27 DIAGNOSIS — F329 Major depressive disorder, single episode, unspecified: Secondary | ICD-10-CM

## 2011-12-27 DIAGNOSIS — F341 Dysthymic disorder: Secondary | ICD-10-CM

## 2011-12-27 DIAGNOSIS — M069 Rheumatoid arthritis, unspecified: Secondary | ICD-10-CM

## 2011-12-27 DIAGNOSIS — F411 Generalized anxiety disorder: Secondary | ICD-10-CM

## 2011-12-27 LAB — CBC WITH DIFFERENTIAL/PLATELET
Basophils Absolute: 0 10*3/uL (ref 0.0–0.1)
Basophils Relative: 0 % (ref 0–1)
Eosinophils Absolute: 0.2 10*3/uL (ref 0.0–0.7)
Hemoglobin: 13 g/dL (ref 12.0–15.0)
MCHC: 33.7 g/dL (ref 30.0–36.0)
Neutro Abs: 5.6 10*3/uL (ref 1.7–7.7)
Neutrophils Relative %: 63 % (ref 43–77)
Platelets: 311 10*3/uL (ref 150–400)
RDW: 14.7 % (ref 11.5–15.5)

## 2011-12-27 LAB — POCT GLYCOSYLATED HEMOGLOBIN (HGB A1C): Hemoglobin A1C: 6.9

## 2011-12-27 MED ORDER — CLONAZEPAM 1 MG PO TABS: 1.0000 mg | ORAL_TABLET | Freq: Three times a day (TID) | ORAL | Status: DC | PRN

## 2011-12-27 MED ORDER — OXYCODONE-ACETAMINOPHEN 10-325 MG PO TABS: 1.0000 | ORAL_TABLET | Freq: Four times a day (QID) | ORAL | Status: DC | PRN

## 2011-12-27 MED ORDER — TRAZODONE HCL 50 MG PO TABS: 50.0000 mg | ORAL_TABLET | Freq: Every day | ORAL | Status: DC

## 2011-12-27 MED ORDER — LOSARTAN POTASSIUM 50 MG PO TABS: 50.0000 mg | ORAL_TABLET | Freq: Every day | ORAL | Status: DC

## 2011-12-27 MED ORDER — NYSTATIN-TRIAMCINOLONE 100000-0.1 UNIT/GM-% EX CREA: TOPICAL_CREAM | Freq: Two times a day (BID) | CUTANEOUS | Status: DC

## 2011-12-27 MED ORDER — FLUCONAZOLE 150 MG PO TABS: 150.0000 mg | ORAL_TABLET | Freq: Once | ORAL | Status: AC

## 2011-12-27 NOTE — Telephone Encounter (Signed)
Prior Authorization required for Losartan.  Forms completed by Dr. Katrinka Blazing and faxed back to [phone].  Michelle [name]

## 2011-12-27 NOTE — Assessment & Plan Note (Signed)
Patient did have improvement of A1c from 7.2-6.9. Encourage patient to continue whatever changes she has made so far. Make no changes in medication but we will add losartan for more of a renal and cardiac preventative measure. Patient blood pressure is minorly elevated so this will also help with that. With patient's risk factors I do think that she should be evaluated by a cardiologist. Will send in referral. Patient on the way out the door did give signs of some stable angina they could not differentiate between her GERD. I do think a formal consult by a cardiologist will be very beneficial.

## 2011-12-27 NOTE — Progress Notes (Signed)
Patient ID: Michelle [name], female   DOB: Jul 29, 1950, 61 y.o.   MRN: [id number] Patient ID: Michelle [name], female   DOB: 1950/09/04, 61 y.o.   MRN: [id number]  Subjective:    Patient ID: Michelle [name], female    DOB: 03-02-1951, 61 y.o.   MRN: [id number]  HPI  Left hand injury-patient was helping husband clear out an old [place] and had the dorsal aspect of her hand tight with a goal to rest the finger. This occurred approximately 2 days ago has started to get very red and swollen. Patient denies any fevers or chills last tetanus shot was in 2006. Patient able to use the hand minorly sore but overall no disabling of the hand.  Rheumatoid arthritis patient has been seen by a rheumatologist at [facility]. Patient was changed back to methotrexate and Plaquenil since last time I saw her. States hasn't made much of a difference but is not worse. Patient does get oxycodone from me which allows her to do her activities of daily living and stay very active such as cleaning her house doing her errands and helping her husband out of work. Had attempted to decrease her Percocet on number of occasions but was unable to do so. Do not feel that patient is dependent. Patient states worse pain is in the extremities especially the knees now. Patient denies any new injuries and has good days and bad days but the Percocet does help.   Diabetes:  High at home: 165 Low at home: 60 Taking medications:yes Side effects:no ROS: denies fever, chills, dizziness, loss of conscieness, polyuria poly dipsia numbness or tingling in extremities or chest pain. Lab Results  Component Value Date   HGBA1C 6.9 12/27/2011    Hypertension Blood pressure at home: Not checking blood other doctor's offices had systolic blood pressures in the 110s and 120s Blood pressure today: 146/88 the patient states she is anxious Taking Meds: Yes Side effects: No ROS: Denies headache visual changes nausea, vomiting, chest pain or abdominal pain or  shortness of breath.  Anxiety-patient is also having significant depression still secondary to the loss of her older sister back in April of this year. Patient states that she's feels like she's not grieving properly, still not able to cry about it has times where she picked up the phone and calls her still. Patient states she is having trouble sleeping significantly now. Patient also has his overwhelming bouts of anxiety where she does not want to do anything except probably to a dark hole. Patient is also dealing with a lot of family members who have different types of cancers as well as other medical conditions that is putting a lot of strain on her and not given a lot of time for herself.  Patient does state the klonopin does help and is not ready to decrease, but does not believe she needs to increase either.   Review of Systems Denies fever, chills, nausea, vomiting, abdominal pain. Past medical history, social, surgical and family history all reviewed.     Objective:   Physical Exam  vitals reviewed General: No apparent distress Cardiovascular: Regular rate and rhythm no murmur Pulmonary: Clear to auscultation bilaterally Abdomen bowel sounds positive nontender nondistended  Affect seems to be more flat than usual mood is depressed  Assessment & Plan:

## 2011-12-27 NOTE — Assessment & Plan Note (Signed)
Still getting patient most of her problems. Patient is seen a specialist but she does not seem optimistic about the future care she is receiving there. We'll continue to give her the Percocet for the pain. Reiterated the pain contract and went over it again with is switching primary care physicians. I do think patient will likely need to some type of chronic narcotic but does potential to try to decrease her regular intervals but we'll need to do this extremely slowly. I do not feel that patient is abusing it at this time.

## 2011-12-27 NOTE — Patient Instructions (Addendum)
It is good to see you. I have refilled your Percocet in your Klonopin. I have giving you 3 months worth. I'm giving you trazodone to try to help you with sleep as well as the loss of your sister. I am giving you a cream for your breasts as well as the Diflucan he can use as needed. We will check the complete blood count. I am  also giving you another new medicine called losartan. This medicine is to help your blood pressure but it helps your heart with a history of diabetes. I am also giving you to see her cardiologist. Your A1c has decreased from 7.2-6.9 this is great I hope you're proud of that. Keep it up. He would be a good idea for you to come back in one month to meet you new doctor and make sure the trazodone is helping.

## 2011-12-27 NOTE — Assessment & Plan Note (Signed)
Patient is showing signs of depression including her anxiety. Patient is on Celexa regularly already we'll add trazodone to try to significant help with sleep pattern as well and Augmentin for her depression therapy. Refill Klonopin gave 3 month supply at the same dose. Would not increase dose ever it would try to titrate down to at least twice a day at some point do not feel this is in patient's best interest at this time. Patient will come back in one month to meet new provider care physician and to make sure the trazodone is working well.

## 2011-12-30 NOTE — Telephone Encounter (Signed)
Approval received from [facility] for Losartan. Pharmacy notified.

## 2012-01-16 ENCOUNTER — Other Ambulatory Visit: Payer: Self-pay | Admitting: Family Medicine

## 2012-01-20 ENCOUNTER — Other Ambulatory Visit (INDEPENDENT_AMBULATORY_CARE_PROVIDER_SITE_OTHER): Payer: Medicaid Other

## 2012-01-20 ENCOUNTER — Ambulatory Visit (INDEPENDENT_AMBULATORY_CARE_PROVIDER_SITE_OTHER): Payer: Medicaid Other | Admitting: Gastroenterology

## 2012-01-20 ENCOUNTER — Encounter: Payer: Self-pay | Admitting: Gastroenterology

## 2012-01-20 VITALS — BP 132/76 | HR 88 | Ht 63.0 in | Wt 178.0 lb

## 2012-01-20 DIAGNOSIS — R109 Unspecified abdominal pain: Secondary | ICD-10-CM

## 2012-01-20 LAB — COMPREHENSIVE METABOLIC PANEL
ALT: 20 U/L (ref 0–35)
AST: 18 U/L (ref 0–37)
Calcium: 9.5 mg/dL (ref 8.4–10.5)
Chloride: 100 mEq/L (ref 96–112)
Creatinine, Ser: 0.7 mg/dL (ref 0.4–1.2)

## 2012-01-20 NOTE — Patient Instructions (Addendum)
You will be set up for a CT scan of abdomen and pelvis with IV and oral contrast for abdominal pain.   You have been scheduled for a CT scan of the abdomen and pelvis at [facility] ([address]---this is in the same building as Architectural technologist).   You are scheduled on 01/21/12 at 1:30 pm. You should arrive 15 minutes prior to your appointment time for registration. Please follow the written instructions below on the day of your exam:  WARNING: IF YOU ARE ALLERGIC TO IODINE/X-RAY DYE, PLEASE NOTIFY RADIOLOGY IMMEDIATELY AT [phone]! YOU WILL BE GIVEN A 13 HOUR PREMEDICATION PREP.  1) Do not eat or drink anything after 930 am  (4 hours prior to your test) 2) You have been given 2 bottles of oral contrast to drink. The solution may taste better if refrigerated, but do NOT add ice or any other liquid to this solution. Shake well before drinking.    Drink 1 bottle of contrast @ 1130 am  (2 hours prior to your exam)  Drink 1 bottle of contrast @ 1230 pm   (1 hour prior to your exam)  You may take any medications as prescribed with a small amount of water except for the following: Metformin, Glucophage, Glucovance, Avandamet, Riomet, Fortamet, Actoplus Met, Janumet, Glumetza or Metaglip. The above medications must be held the day of the exam AND 48 hours after the exam.  The purpose of you drinking the oral contrast is to aid in the visualization of your intestinal tract. The contrast solution may cause some diarrhea. Before your exam is started, you will be given a small amount of fluid to drink. Depending on your individual set of symptoms, you may also receive an intravenous injection of x-ray contrast/dye. Plan on being at [name] for 30 minutes or long, depending on the type of exam you are having performed.  If you have any questions regarding your exam or if you need to reschedule, you may call the CT department at [phone] between the hours of 8:00 am and  5:00 pm, Monday-Friday.  ________________________________________________________________________  Bonita Quin will have labs checked today in the basement lab.  Please head down after you check out with the front desk  (cmet, lipase, amylase).

## 2012-01-20 NOTE — Progress Notes (Signed)
Review of pertinent gastrointestinal problems:  1. EGD 2006 with Dr. Matthias Hughs: This was done for Hemoccult-positive stool. He wrote about bile reflux into stomach and esophagus, possible minimal Barrett's esophagus. Biopsies of the GE junction showed mild inflammation only without any intestinal metaplasia.  2. Elevated risk for colon cancer (FH +): Colonoscopy January 2012 findings diverticulosis but was otherwise normal. Dr. Matthias Hughs recommended she have a repeat colonoscopy at 10 year interval for her routine risk screening. Shortly after that her daughter was diagnosed with colon cancer which puts her at increased risk and recall was adjusted to 5 year interval.  3. dysphagia like symptoms. April 2013 EGD by Dr. Christella Hartigan. Mild gastritis, H. pylori negative on biopsy, retained solid and liquid food without anatomic gastric outlet obstruction, this suggested gastroparesis. DM and daily narcotic medicines likely contribute,   HPI: This is a  very pleasant 61 year old woman whom I last saw about a month or 2 ago.  Has had 2-3 weeks of abd pains.  Straining to use the bathroom makes it worse.   Has bilateral lower abd pains, radiate to her back.   Overall weight is stable.  No fevers, no chills, no overt GI bleeding, no nausea or vomiting  Under a lot of stress, lives with bipolar granddaughter.  Still grieving over sisters death.  Gallbladder removed many years ago.  Past Medical History  Diagnosis Date  . RA (rheumatoid arthritis)     CCP positive followed by [facility]  . HLD (hyperlipidemia)   . Diabetes mellitus   . Asthma   . Allergy   . Anxiety   . Cataract     beginning on left eye  . GERD (gastroesophageal reflux disease)   . Osteoporosis     Past Surgical History  Procedure Date  . Appendectomy   . Cholecystectomy   . Abdominal hysterectomy   . Carpal tunnel release   . Knee surgery     x2  . Hernia repair   . Shoulder surgery     x2  . Right foot bone spurs removed      Current Outpatient Prescriptions  Medication Sig Dispense Refill  . ACCU-CHEK AVIVA PLUS test strip TEST BLOOD SUGAR 3 TIMES DAILY  300 strip  5  . aspirin 81 MG chewable tablet Chew 81 mg by mouth daily.        . cetirizine (ZYRTEC) 10 MG tablet TAKE 1 TABLET AT BEDTIME  31 tablet  3  . citalopram (CELEXA) 40 MG tablet TAKE 1 TABLET DAILY  30 tablet  6  . clonazePAM (KLONOPIN) 1 MG tablet Take 1 tablet (1 mg total) by mouth 3 (three) times daily as needed.  90 tablet  0  . CRESTOR 10 MG tablet TAKE 1 TABLET AT BEDTIME  90 tablet  2  . fluticasone (FLONASE) 50 MCG/ACT nasal spray USE 2 SPRAYS INTO THE NOSE DAILY.  16 g  6  . folic acid (FOLVITE) 1 MG tablet Take 1 mg by mouth daily.      . hydroxychloroquine (PLAQUENIL) 200 MG tablet Uses as directed      . losartan (COZAAR) 50 MG tablet Take 1 tablet (50 mg total) by mouth daily.  90 tablet  3  . metFORMIN (GLUCOPHAGE) 1000 MG tablet TAKE 1 TABLET (1,000 MG TOTAL) BY MOUTH 2 (TWO) TIMES DAILY WITH A MEAL.  60 tablet  3  . methotrexate (RHEUMATREX) 10 MG tablet Take 10 mg by mouth once a week. Caution: Chemotherapy. Protect from light.      Marland Kitchen  NEXIUM 40 MG capsule TAKE 1 CAPSULE BY MOUTH DAILY  90 capsule  1  . nitroGLYCERIN (NITROSTAT) 0.4 MG SL tablet Place 0.4 mg under the tongue every 5 (five) minutes as needed.        . nystatin-triamcinolone (MYCOLOG II) cream Apply topically 2 (two) times daily.  60 g  0  . oxyCODONE-acetaminophen (PERCOCET) 10-325 MG per tablet Take 1 tablet by mouth every 6 (six) hours as needed for pain.  120 tablet  0  . predniSONE (DELTASONE) 5 MG tablet Take 5 mg by mouth daily.      . traZODone (DESYREL) 50 MG tablet Take 1 tablet (50 mg total) by mouth at bedtime.  90 tablet  1  . VENTOLIN HFA 108 (90 BASE) MCG/ACT inhaler INHALE 2 PUFFS INTO THE LUNGS EVERY 4 (FOUR) HOURS AS NEEDED FOR WHEEZING.  18 g  6  . DISCONTD: clonazePAM (KLONOPIN) 1 MG tablet Take 1 tablet (1 mg total) by mouth 3 (three) times daily  as needed for anxiety. Do not fill until 60 days after date printed.  90 tablet  0  . DISCONTD: clonazePAM (KLONOPIN) 1 MG tablet Take 1 tablet (1 mg total) by mouth 3 (three) times daily as needed for anxiety. Do not fill until 30 days after date printed.  90 tablet  0  . DISCONTD: oxyCODONE-acetaminophen (PERCOCET) 10-325 MG per tablet Take 1 tablet by mouth every 6 (six) hours as needed for pain. Do not fill until 30 days past prescription date  120 tablet  0  . DISCONTD: oxyCODONE-acetaminophen (PERCOCET) 10-325 MG per tablet Take 1 tablet by mouth every 6 (six) hours as needed for pain. Do not fill until 60 days after date printed.  120 tablet  0    Allergies as of 01/20/2012 - Review Complete 01/20/2012  Allergen Reaction Noted  . Sulfonamide derivatives Hives     Family History  Problem Relation Age of Onset  . Stroke Father 73    died  . Lung cancer Brother 49    died  . Rheum arthritis Sister 63  . Cancer Sister     breast  . Cancer Daughter     colon  . Colon cancer Maternal Aunt   . Esophageal cancer Neg Hx   . Rectal cancer Neg Hx   . Stomach cancer Neg Hx   . Cancer Mother     uterine  . Cancer Sister     colon    History   Social History  . Marital Status: Married    Spouse Name: N/A    Number of Children: 3  . Years of Education: N/A   Occupational History  . disabled   .     Social History Main Topics  . Smoking status: Former Smoker    Types: Cigarettes    Quit date: 05/31/1994  . Smokeless tobacco: Never Used  . Alcohol Use: No  . Drug Use: No  . Sexually Active: Not on file   Other Topics Concern  . Not on file   Social History Narrative   Pt is on MAP program [place]ra [place]ots of family stressors including grandaughter who lives with her      Physical Exam: BP 132/76  Pulse 88  Ht 5\' 3"  (1.6 m)  Wt 178 lb (80.74 kg)  BMI 31.53 kg/m2 Constitutional: generally well-appearing Psychiatric: alert and oriented x3 Abdomen: soft, mildly  tender throughout abd, nondistended, no obvious ascites, no peritoneal signs, normal bowel sounds  Assessment and plan: 61 y.o. female with abdominal pain that radiates to her back  Unclear etiology. She's had a recent EGD, fairly recent colonoscopy. Her symptoms seem new. She is not on any NSAIDs regularly. I'll begin the workup with some basic labs including a complete metabolic profile, amylase, lipase as well as a CT scan of her abdomen and pelvis.

## 2012-01-21 ENCOUNTER — Telehealth: Payer: Self-pay | Admitting: *Deleted

## 2012-01-21 ENCOUNTER — Ambulatory Visit (INDEPENDENT_AMBULATORY_CARE_PROVIDER_SITE_OTHER)
Admission: RE | Admit: 2012-01-21 | Discharge: 2012-01-21 | Disposition: A | Discharge status: Home / Self Care | Payer: Medicaid Other | Source: Ambulatory Visit | Attending: Gastroenterology | Admitting: Gastroenterology

## 2012-01-21 DIAGNOSIS — R109 Unspecified abdominal pain: Secondary | ICD-10-CM

## 2012-01-21 MED ORDER — IOHEXOL 300 MG/ML  SOLN
100.0000 mL | Freq: Once | INTRAMUSCULAR | Status: AC | PRN
  Administered 2012-01-21: 100 mL via INTRAVENOUS

## 2012-01-21 NOTE — Telephone Encounter (Signed)
PA required for nexium. Form placed in MD box 

## 2012-01-22 MED ORDER — OMEPRAZOLE 40 MG PO CPDR: 40.0000 mg | DELAYED_RELEASE_CAPSULE | Freq: Every day | ORAL | Status: DC

## 2012-01-22 NOTE — Telephone Encounter (Signed)
Our records do not indicate patient has failed preferred medications.  Rx for omeprazole sent to pharmacy.

## 2012-01-27 ENCOUNTER — Encounter: Payer: Self-pay | Admitting: Family Medicine

## 2012-01-27 ENCOUNTER — Ambulatory Visit (INDEPENDENT_AMBULATORY_CARE_PROVIDER_SITE_OTHER): Payer: Medicaid Other | Admitting: Family Medicine

## 2012-01-27 VITALS — BP 127/83 | HR 82 | Ht 63.5 in | Wt 179.0 lb

## 2012-01-27 DIAGNOSIS — Y92009 Unspecified place in unspecified non-institutional (private) residence as the place of occurrence of the external cause: Secondary | ICD-10-CM | POA: Insufficient documentation

## 2012-01-27 DIAGNOSIS — S7010XA Contusion of unspecified thigh, initial encounter: Secondary | ICD-10-CM

## 2012-01-27 DIAGNOSIS — W19XXXA Unspecified fall, initial encounter: Secondary | ICD-10-CM

## 2012-01-27 DIAGNOSIS — E119 Type 2 diabetes mellitus without complications: Secondary | ICD-10-CM

## 2012-01-27 DIAGNOSIS — F341 Dysthymic disorder: Secondary | ICD-10-CM

## 2012-01-27 DIAGNOSIS — F329 Major depressive disorder, single episode, unspecified: Secondary | ICD-10-CM

## 2012-01-27 MED ORDER — ESOMEPRAZOLE MAGNESIUM 40 MG PO CPDR: 40.0000 mg | DELAYED_RELEASE_CAPSULE | Freq: Every day | ORAL | Status: DC

## 2012-01-27 MED ORDER — CETIRIZINE HCL 10 MG PO TABS: 10.0000 mg | ORAL_TABLET | Freq: Every day | ORAL | Status: DC

## 2012-01-27 MED ORDER — FLUTICASONE PROPIONATE 50 MCG/ACT NA SUSP: 1.0000 | Freq: Every day | NASAL | Status: DC

## 2012-01-27 MED ORDER — ALBUTEROL SULFATE HFA 108 (90 BASE) MCG/ACT IN AERS: 2.0000 | INHALATION_SPRAY | RESPIRATORY_TRACT | Status: DC | PRN

## 2012-01-27 NOTE — Assessment & Plan Note (Signed)
Patient denies any cardiac symptoms, but this makes me concerned about her functional status.  Advised her to be more careful, that perhaps she should have someone help her with things like changing light bulbs.  Will continue to monitor.

## 2012-01-27 NOTE — Assessment & Plan Note (Signed)
Well controlled per patient report.  Will continue current dose of metformin.

## 2012-01-27 NOTE — Assessment & Plan Note (Signed)
Discussed dangers and illegality of taking other people's medications.  I advised I thought that therapy would be more beneficial in the long term for her anxiety.  Patient declines referral to Dr. Pascal Lux at this time, but says she will think about this.

## 2012-01-27 NOTE — Progress Notes (Signed)
  Subjective:    Patient ID: Michelle [name], female    DOB: 03-08-1951, 61 y.o.   MRN: [id number]  HPI  Ms. Michelle [name] comes in for follow up.    DM- says fasting blood sugars are around 100, which she is pleased with.  She is taking metformin 1000 mg po bid, no problems.  No hypo or hypoglycemic episodes.  Anxiety- her granddaughter is living with her, and this is causing her significant stress. She says she took one of her sisters xanax in addition to her klonopin, and says it helped her relax.  No panic attacks.   Fall- patient says that a few weeks ago, she was changing a light bulb and fell, and bruised her leg badly.  She did not hit her head, and was able to walk, she did not seek medical care.  She says she just lost her balance, denies any light headedness, syncope, chest pain.    Review of Systems See HPI    Objective:   Physical Exam BP 127/83  Pulse 82  Ht 5' 3.5" (1.613 m)  Wt 179 lb (81.194 kg)  BMI 31.21 kg/m2 General appearance: alert, cooperative and no distress Neck: no adenopathy, supple, symmetrical, trachea midline and thyroid not enlarged, symmetric, no tenderness/mass/nodules Lungs: clear to auscultation bilaterally Heart: regular rate and rhythm, S1, S2 normal, no murmur, click, rub or gallop Abdomen: soft, non-tender; bowel sounds normal; no masses,  no organomegaly Extremities: Right thigh with resolving bruise Pulses: 2+ and symmetric       Assessment & Plan:

## 2012-01-27 NOTE — Patient Instructions (Signed)
It was nice to meet you.  Your blood pressure today was BP: 127/83 mmHg.  Remember your goal blood pressure is about 130/80.  Please be sure to take your medication every day.  Please make an appointment to see me in about one month.

## 2012-02-17 ENCOUNTER — Encounter: Payer: Self-pay | Admitting: Gastroenterology

## 2012-02-17 ENCOUNTER — Ambulatory Visit (INDEPENDENT_AMBULATORY_CARE_PROVIDER_SITE_OTHER): Payer: Medicaid Other | Admitting: Gastroenterology

## 2012-02-17 VITALS — BP 128/74 | HR 88 | Ht 63.0 in | Wt 177.0 lb

## 2012-02-17 DIAGNOSIS — R1013 Epigastric pain: Secondary | ICD-10-CM

## 2012-02-17 NOTE — Progress Notes (Signed)
Review of pertinent gastrointestinal problems:  1. EGD 2006 with Dr. Matthias Hughs: This was done for Hemoccult-positive stool. He wrote about bile reflux into stomach and esophagus, possible minimal Barrett's esophagus. Biopsies of the GE junction showed mild inflammation only without any intestinal metaplasia.  2. Elevated risk for colon cancer (FH +): Colonoscopy January 2012 findings diverticulosis but was otherwise normal. Dr. Matthias Hughs recommended she have a repeat colonoscopy at 10 year interval for her routine risk screening. Shortly after that her daughter was diagnosed with colon cancer which puts her at increased risk and recall was adjusted to 5 year interval.  3. dysphagia like symptoms. April 2013 EGD by Dr. Christella Hartigan. Mild gastritis, H. pylori negative on biopsy, retained solid and liquid food without anatomic gastric outlet obstruction, this suggested gastroparesis. DM and daily narcotic medicines likely contribute, 4. abd pain 7/13: cbc, cmet, amylas, lipase were normal: CT scan abd pelvis with IV and PO contrast   HPI: This is a  pleasant 61 year old woman who is here with her daughter and granddaughter today.  She's been feeling overall much better.  CT scan and labs showed no clear causes of pain (mid epig radiating to her back).  It went away.  Pain is gone.   Past Medical History  Diagnosis Date  . RA (rheumatoid arthritis)     CCP positive followed by [facility]  . HLD (hyperlipidemia)   . Diabetes mellitus   . Asthma   . Allergy   . Anxiety   . Cataract     beginning on left eye  . GERD (gastroesophageal reflux disease)   . Osteoporosis     Past Surgical History  Procedure Date  . Appendectomy   . Cholecystectomy   . Abdominal hysterectomy   . Carpal tunnel release   . Knee surgery     x2  . Hernia repair   . Shoulder surgery     x2  . Right foot bone spurs removed     Current Outpatient Prescriptions  Medication Sig Dispense Refill  . ACCU-CHEK AVIVA PLUS test strip  TEST BLOOD SUGAR 3 TIMES DAILY  300 strip  5  . albuterol (PROAIR HFA) 108 (90 BASE) MCG/ACT inhaler Inhale 2 puffs into the lungs every 4 (four) hours as needed for wheezing.  2 Inhaler  6  . aspirin 81 MG chewable tablet Chew 81 mg by mouth daily.        . cetirizine (ZYRTEC) 10 MG tablet Take 1 tablet (10 mg total) by mouth daily.  31 tablet  11  . citalopram (CELEXA) 40 MG tablet TAKE 1 TABLET DAILY  30 tablet  6  . clonazePAM (KLONOPIN) 1 MG tablet Take 1 tablet (1 mg total) by mouth 3 (three) times daily as needed.  90 tablet  0  . CRESTOR 10 MG tablet TAKE 1 TABLET AT BEDTIME  90 tablet  2  . esomeprazole (NEXIUM) 40 MG capsule Take 1 capsule (40 mg total) by mouth daily before breakfast.  90 capsule  3  . fluticasone (FLONASE) 50 MCG/ACT nasal spray Place 1 spray into the nose daily.  16 g  6  . folic acid (FOLVITE) 1 MG tablet Take 1 mg by mouth daily.      . hydroxychloroquine (PLAQUENIL) 200 MG tablet Uses as directed      . losartan (COZAAR) 50 MG tablet Take 1 tablet (50 mg total) by mouth daily.  90 tablet  3  . metFORMIN (GLUCOPHAGE) 1000 MG tablet TAKE 1 TABLET (1,000  MG TOTAL) BY MOUTH 2 (TWO) TIMES DAILY WITH A MEAL.  60 tablet  3  . methotrexate (RHEUMATREX) 10 MG tablet Take 10 mg by mouth once a week. Caution: Chemotherapy. Protect from light.      . nitroGLYCERIN (NITROSTAT) 0.4 MG SL tablet Place 0.4 mg under the tongue every 5 (five) minutes as needed.        . nystatin-triamcinolone (MYCOLOG II) cream Apply topically 2 (two) times daily.  60 g  0  . omeprazole (PRILOSEC) 40 MG capsule Take 1 capsule (40 mg total) by mouth daily.  30 capsule  11  . oxyCODONE-acetaminophen (PERCOCET) 10-325 MG per tablet Take 1 tablet by mouth every 6 (six) hours as needed for pain.  120 tablet  0  . predniSONE (DELTASONE) 5 MG tablet Take 2.5 mg by mouth daily.       . traZODone (DESYREL) 50 MG tablet Take 50 mg by mouth at bedtime.        Allergies as of 02/17/2012 - Review Complete  02/17/2012  Allergen Reaction Noted  . Sulfonamide derivatives Hives     Family History  Problem Relation Age of Onset  . Stroke Father 4    died  . Lung cancer Brother 49    died  . Rheum arthritis Sister 23  . Cancer Sister     breast  . Cancer Daughter     colon  . Colon cancer Maternal Aunt   . Esophageal cancer Neg Hx   . Rectal cancer Neg Hx   . Stomach cancer Neg Hx   . Cancer Mother     uterine  . Cancer Sister     colon    History   Social History  . Marital Status: Married    Spouse Name: N/A    Number of Children: 3  . Years of Education: N/A   Occupational History  . disabled   .     Social History Main Topics  . Smoking status: Former Smoker    Types: Cigarettes    Quit date: 05/31/1994  . Smokeless tobacco: Never Used  . Alcohol Use: No  . Drug Use: No  . Sexually Active: Not on file   Other Topics Concern  . Not on file   Social History Narrative   Pt is on MAP program [place]ra hill[place]ots of family stressors including grandaughter who lives with her      Physical Exam: BP 128/74  Pulse 88  Ht 5\' 3"  (1.6 m)  Wt 177 lb (80.287 kg)  BMI 31.35 kg/m2 Constitutional: generally well-appearing Psychiatric: alert and oriented x3 Abdomen: soft, nontender, nondistended, no obvious ascites, no peritoneal signs, normal bowel sounds     Assessment and plan: 61 y.o. female with improved epigastric radiating to back pain  Her symptoms have improved. There may have been musculoskeletal. Perhaps IBS related. CT scan, blood work was all essentially negative. She'll return if she has any repeat problems

## 2012-02-17 NOTE — Patient Instructions (Addendum)
Call if you have trouble with abd pains. Trial of single imodium once daily for diarrhea predominant IBS.

## 2012-02-19 ENCOUNTER — Telehealth: Payer: Self-pay | Admitting: Family Medicine

## 2012-02-19 DIAGNOSIS — F411 Generalized anxiety disorder: Secondary | ICD-10-CM

## 2012-02-19 MED ORDER — CLONAZEPAM 1 MG PO TABS: 1.0000 mg | ORAL_TABLET | Freq: Three times a day (TID) | ORAL | Status: DC | PRN

## 2012-02-19 NOTE — Telephone Encounter (Signed)
Prior authorization form for nexium filled out and returned to [place].

## 2012-02-19 NOTE — Telephone Encounter (Signed)
PA approval received . Patient  stated she tried and failed prilosec and protonix.  Pharmacy notified that it has been approved thru medicaid.

## 2012-02-19 NOTE — Telephone Encounter (Signed)
Called patient left VM. Informed patient that I will refill klonopin for 1 months additional refills will be made by Dr. Lula Olszewski.   Faxed back refill request to [facility]

## 2012-03-04 ENCOUNTER — Encounter: Payer: Self-pay | Admitting: Family Medicine

## 2012-03-04 ENCOUNTER — Ambulatory Visit (INDEPENDENT_AMBULATORY_CARE_PROVIDER_SITE_OTHER): Payer: Medicaid Other | Admitting: Family Medicine

## 2012-03-04 VITALS — BP 140/83 | HR 92 | Temp 98.1°F | Ht 63.0 in | Wt 177.0 lb

## 2012-03-04 DIAGNOSIS — F341 Dysthymic disorder: Secondary | ICD-10-CM

## 2012-03-04 DIAGNOSIS — J45909 Unspecified asthma, uncomplicated: Secondary | ICD-10-CM

## 2012-03-04 DIAGNOSIS — R0600 Dyspnea, unspecified: Secondary | ICD-10-CM

## 2012-03-04 DIAGNOSIS — M069 Rheumatoid arthritis, unspecified: Secondary | ICD-10-CM

## 2012-03-04 DIAGNOSIS — R0989 Other specified symptoms and signs involving the circulatory and respiratory systems: Secondary | ICD-10-CM

## 2012-03-04 DIAGNOSIS — F329 Major depressive disorder, single episode, unspecified: Secondary | ICD-10-CM

## 2012-03-04 MED ORDER — OXYCODONE-ACETAMINOPHEN 10-325 MG PO TABS: 1.0000 | ORAL_TABLET | Freq: Four times a day (QID) | ORAL | Status: DC | PRN

## 2012-03-04 NOTE — Patient Instructions (Signed)
It was good to see you.  [facility] to have a Chest x-ray done.  Also, ask the front desk to schedule you in Pharmacy clinic with Dr. Raymondo Band for Spirometry- these are the lung function tests.   We need to keep a close eye on your blood pressure.  If it is above 140/90, we may need to add another blood pressure medication. Please come back and see me in about 3 months.

## 2012-03-04 NOTE — Progress Notes (Signed)
  Subjective:    Patient ID: Michelle [name], female    DOB: 1950-09-03, 61 y.o.   MRN: [id number]  HPI  Michelle [name] comes in for follow up.    Asthma/Dyspnea- She has felt more short of breath than normal lately, which is making it even harder to get around and stay active.  She is on methotrexate for her RA, and her Rheumatologist told her he was concerned the MTX may be scarring her lungs, which scared her.  She says she quit smoking about 20 years ago, and thinks she only smoked for 5 or 10 years total.  She takes albuterol as needed for her asthma.   Anxeity/Depression- She says that her mood and anxiety have been relatively well controlled lately.  She is trying to only take two klonopin a day as she does not like being hooked to this medication.  She is taking Celexa 40 mg daily for her mood as well.   Rheumatoid arthritis and pain- she is seeing a rheumatologist at [facility], who is treating her qith plaquenil and methotrexate.  They have tapered her off of prednisone.  She says that her pain has been worsening lately, and she is taking 3 or 4 percocet per day.  The percocet help her be able to move around and be more active, and take care of herself.  Past Medical History  Diagnosis Date  . RA (rheumatoid arthritis)     CCP positive followed by [facility]  . HLD (hyperlipidemia)   . Diabetes mellitus   . Asthma   . Allergy   . Anxiety   . Cataract     beginning on left eye  . GERD (gastroesophageal reflux disease)   . Osteoporosis    Family History  Problem Relation Age of Onset  . Stroke Father 52    died  . Lung cancer Brother 49    died  . Rheum arthritis Sister 4  . Cancer Sister     breast  . Cancer Daughter     colon  . Colon cancer Maternal Aunt   . Esophageal cancer Neg Hx   . Rectal cancer Neg Hx   . Stomach cancer Neg Hx   . Cancer Mother     uterine  . Cancer Sister     colon   History  Substance Use Topics  . Smoking status: Former Smoker    Types: Cigarettes      Quit date: 05/31/1994  . Smokeless tobacco: Never Used  . Alcohol Use: No    Review of Systems See HPI    Objective:   Physical Exam BP 140/83  Pulse 92  Temp 98.1 F (36.7 C) (Oral)  Ht 5\' 3"  (1.6 m)  Wt 177 lb (80.287 kg)  BMI 31.35 kg/m2 General appearance: alert, cooperative and no distress Neck: no adenopathy, supple, symmetrical, trachea midline and thyroid not enlarged, symmetric, no tenderness/mass/nodules Lungs: Clear lungs excet fine bibasilar crackles. Heart: regular rate and rhythm, S1, S2 normal, no murmur, click, rub or gallop Extremities: extremities normal, atraumatic, no cyanosis or edema Pulses: 2+ and symmetric       Assessment & Plan:

## 2012-03-04 NOTE — Assessment & Plan Note (Signed)
Patient has worsened from a pain perspective- will refill percocet at current dosage.  I also think she would benefit from an adjunctive treatment (SNRI, TCA, etc), will consider these options if her RA pain does not improve.

## 2012-03-04 NOTE — Assessment & Plan Note (Signed)
Unclear if respiratory sxs are Asthma +/- COPD, or if new lung problem developed as result of RA tx.  Will order tests to define lung problems.

## 2012-03-04 NOTE — Assessment & Plan Note (Signed)
Concern for development of interstitial disease given crackles here (and at Rheum visit in July).  Will obtain CXR and have her come in for spirometry.

## 2012-03-04 NOTE — Assessment & Plan Note (Signed)
Doing well on Klonopin and Celexa- would consider changing to SNRI instead to overlap more with chronic pain.

## 2012-03-05 ENCOUNTER — Ambulatory Visit
Admission: RE | Admit: 2012-03-05 | Discharge: 2012-03-05 | Disposition: A | Discharge status: Home / Self Care | Payer: Medicaid Other | Source: Ambulatory Visit | Attending: Family Medicine | Admitting: Family Medicine

## 2012-03-05 DIAGNOSIS — R0689 Other abnormalities of breathing: Secondary | ICD-10-CM

## 2012-03-05 DIAGNOSIS — J45909 Unspecified asthma, uncomplicated: Secondary | ICD-10-CM

## 2012-03-09 ENCOUNTER — Ambulatory Visit (INDEPENDENT_AMBULATORY_CARE_PROVIDER_SITE_OTHER): Payer: Medicaid Other | Admitting: Pharmacist

## 2012-03-09 ENCOUNTER — Encounter: Payer: Self-pay | Admitting: Pharmacist

## 2012-03-09 VITALS — Ht 63.0 in | Wt 176.9 lb

## 2012-03-09 DIAGNOSIS — J45909 Unspecified asthma, uncomplicated: Secondary | ICD-10-CM

## 2012-03-09 DIAGNOSIS — R0689 Other abnormalities of breathing: Secondary | ICD-10-CM

## 2012-03-09 DIAGNOSIS — R0989 Other specified symptoms and signs involving the circulatory and respiratory systems: Secondary | ICD-10-CM

## 2012-03-09 NOTE — Progress Notes (Signed)
Patient ID: Michelle [name], female   DOB: [birth year], 61 y.o.   MRN: [id number] Reviewed and agree with Dr. Macky Lower management and documentation.

## 2012-03-09 NOTE — Assessment & Plan Note (Signed)
Spirometry evaluation with Pre and Post Bronchodilator reveals near-normal lung function with no change after albuterol.  Patient has been experiencing SOB and taking albuterol. Continue current treatment plan at this time.  Reviewed results of pulmonary function tests.  Pt verbalized understanding of results.  Written pt instructions provided.  Total time in face to face counseling 40 minutes.  Patient seen with Allene Dillon, PharmD Candidate and Doris Cheadle, PharmD, Pharmacy Resident.

## 2012-03-09 NOTE — Progress Notes (Signed)
  Subjective:    Patient ID: Michelle [name], female    DOB: 1951-02-11, 61 y.o.   MRN: [id number]  HPI Pt at clinic for pulmonary function tests in good spirits with her granddaughter.  She reports increased SOB lately, and her rheumatologist was concerned about methotrexate scarring her lungs.  She reports she gets short of breath after about 10 minutes of walking around a grocery store and carrying groceries in. Pt reports having to stop half way up a flight of stairs to catch her breath.  She has arthritis, and her knee pain may also play a role in her difficulty with stairs.  She says she tries to walk 2-3x/week with her granddaughter. She says does not use her albuterol inhaler every day, with the last time she used it was on Saturday (9/7). Of note, she is having a "good" day, and she reports minimal breathing issues today.  Remote history of 5 years tobacco use.  Review of Systems     Objective:   Physical Exam  See Documentation Flowsheet (discrete results - PFTs) for complete Spirometry results. Patient provided good effort while attempting spirometry.   Albuterol Neb  Lot# [id number]     Exp. March 2015  Peak flow: 440 today.        Assessment & Plan:  Spirometry evaluation with Pre and Post Bronchodilator reveals near-normal lung function with no change after albuterol.  Patient has been experiencing SOB and taking albuterol. Continue current treatment plan at this time.  Reviewed results of pulmonary function tests.  Pt verbalized understanding of results.  Written pt instructions provided.  Total time in face to face counseling 40 minutes.  Patient seen with Allene Dillon, PharmD Candidate and Doris Cheadle, PharmD, Pharmacy Resident.

## 2012-03-09 NOTE — Patient Instructions (Addendum)
Your lung function tests are showing normal lung function.  Try to increase activity level as much as possible.  Your peak flow is 440 today.

## 2012-03-09 NOTE — Assessment & Plan Note (Signed)
Spirometry evaluation with Pre and Post Bronchodilator reveals near-normal lung function with no change after albuterol.  Patient has been experiencing SOB and taking albuterol. Continue current treatment plan at this time.  Reviewed results of pulmonary function tests.  Pt verbalized understanding of results.  Written pt instructions provided.  Total time in face to face counseling 40 minutes.  Patient seen with Logan Dawson, PharmD Candidate and Drue Fritschle, PharmD, Pharmacy Resident. 

## 2012-03-11 ENCOUNTER — Ambulatory Visit (INDEPENDENT_AMBULATORY_CARE_PROVIDER_SITE_OTHER): Payer: Medicaid Other | Admitting: *Deleted

## 2012-03-11 DIAGNOSIS — Z111 Encounter for screening for respiratory tuberculosis: Secondary | ICD-10-CM

## 2012-03-13 ENCOUNTER — Ambulatory Visit (INDEPENDENT_AMBULATORY_CARE_PROVIDER_SITE_OTHER): Payer: Medicaid Other | Admitting: *Deleted

## 2012-03-13 DIAGNOSIS — Z23 Encounter for immunization: Secondary | ICD-10-CM

## 2012-03-13 LAB — TB SKIN TEST: Induration: 0 mm

## 2012-03-17 ENCOUNTER — Other Ambulatory Visit: Payer: Self-pay | Admitting: Family Medicine

## 2012-03-17 ENCOUNTER — Ambulatory Visit: Payer: Medicaid Other | Admitting: Family Medicine

## 2012-03-23 ENCOUNTER — Ambulatory Visit (INDEPENDENT_AMBULATORY_CARE_PROVIDER_SITE_OTHER): Payer: Medicaid Other | Admitting: Family Medicine

## 2012-03-23 ENCOUNTER — Encounter: Payer: Self-pay | Admitting: Family Medicine

## 2012-03-23 VITALS — BP 125/77 | HR 87 | Temp 99.1°F | Ht 63.0 in | Wt 173.0 lb

## 2012-03-23 DIAGNOSIS — J029 Acute pharyngitis, unspecified: Secondary | ICD-10-CM

## 2012-03-23 DIAGNOSIS — J069 Acute upper respiratory infection, unspecified: Secondary | ICD-10-CM

## 2012-03-23 LAB — POCT RAPID STREP A (OFFICE): Rapid Strep A Screen: NEGATIVE

## 2012-03-23 MED ORDER — IBUPROFEN 600 MG PO TABS: 600.0000 mg | ORAL_TABLET | Freq: Two times a day (BID) | ORAL | Status: DC | PRN

## 2012-03-23 NOTE — Patient Instructions (Addendum)
You seem to have a viral sore throat and upper resp infection. Best to treat with nasal saline spray 2-3 times daily to keep sinuses drained. Keep taking zyrtec. Restart flonase if this does not irritate your nose sore-you can use on the opposite side Do salt water gargles to relieve sore throat. Call if symptoms worsen, you have fever or shortness of breath or if not improving in 4 days.  Upper Respiratory Infection, Adult An upper respiratory infection (URI) is also known as the common cold. It is often caused by a type of germ (virus). Colds are easily spread (contagious). You can pass it to others by kissing, coughing, sneezing, or drinking out of the same glass. Usually, you get better in 1 or 2 weeks.  HOME CARE   Only take medicine as told by your doctor.   Use a warm mist humidifier or breathe in steam from a hot shower.   Drink enough water and fluids to keep your pee (urine) clear or pale yellow.   Get plenty of rest.   Return to work when your temperature is back to normal or as told by your doctor. You may use a face mask and wash your hands to stop your cold from spreading.  GET HELP RIGHT AWAY IF:   After the first few days, you feel you are getting worse.   You have questions about your medicine.   You have chills, shortness of breath, or brown or red spit (mucus).   You have yellow or brown snot (nasal discharge) or pain in the face, especially when you bend forward.   You have a fever, puffy (swollen) neck, pain when you swallow, or white spots in the back of your throat.   You have a bad headache, ear pain, sinus pain, or chest pain.   You have a high-pitched whistling sound when you breathe in and out (wheezing).   You have a lasting cough or cough up blood.   You have sore muscles or a stiff neck.  MAKE SURE YOU:   Understand these instructions.   Will watch your condition.   Will get help right away if you are not doing well or get worse.  Document  Released: 12/04/2007 Document Revised: 06/06/2011 Document Reviewed: 10/22/2010 [facility] Patient Information 2012 [place], [place].

## 2012-03-23 NOTE — Progress Notes (Signed)
  Subjective:    Patient ID: Michelle [name], female    DOB: Dec 02, 1950, 61 y.o.   MRN: [id number]  HPI  1. Sore throat. 61 yo female with rheumatoid arthritis on DMARDs c/o sore throat that started 2 days ago. Has pain with swallowing, sensation of swollen glands on the left. +nasal congestion and sinus congestion, mild clear rhinorrhea. Notes some irritation in left nostril. Feels general malaise. Denies cough, fever, dyspnea, wheezing, chest pain, dysphagia, stridor, facial pain, purulent drainage, nausea, emesis.   She has already had flu shot this year. No sick contacts.  Review of Systems See HPI otherwise negative.  reports that she quit smoking about 17 years ago. Her smoking use included Cigarettes. She has a 7.5 pack-year smoking history. She has never used smokeless tobacco.     Objective:   Physical Exam  Vitals reviewed. Constitutional: She is oriented to person, place, and time. She appears well-developed and well-nourished. No distress.  HENT:  Head: Normocephalic and atraumatic.       Bilateral serous fluid posterior TMs. No dullness or redness or retraction.  Shoddy left ant cervical LAD and general tenderness.  No stridor, no sinus tenderness, no purulent drainage. Nasal mucosa edematous, left nare with shallow irritated area anteriorly.   Eyes: EOM are normal. Pupils are equal, round, and reactive to light.  Neck: Neck supple. No tracheal deviation present. No thyromegaly present.  Cardiovascular: Normal rate, regular rhythm and normal heart sounds.   No murmur heard. Pulmonary/Chest: Effort normal and breath sounds normal. No stridor. No respiratory distress. She has no wheezes. She has no rales.  Musculoskeletal: She exhibits no edema.  Neurological: She is alert and oriented to person, place, and time. No cranial nerve deficit. She exhibits normal muscle tone. Coordination normal.  Skin: No rash noted. She is not diaphoretic.  Psychiatric: She has a normal mood  and affect.          Assessment & Plan:

## 2012-03-23 NOTE — Assessment & Plan Note (Signed)
2 days symptoms without evidence of bacterial infection. Supportive care flonase, nasal saline, salt water gargles, motrin short term. Continue zyrtec. avoid uneccessary antibiotics. Discussed natural course of illness including cough that should improve, but may persist for weeks. Monitor for worsening symptoms over next week and RTC for any fevers, dyspnea, worsening or other concerns.

## 2012-04-06 ENCOUNTER — Other Ambulatory Visit: Payer: Self-pay | Admitting: *Deleted

## 2012-04-06 MED ORDER — ROSUVASTATIN CALCIUM 10 MG PO TABS: 10.0000 mg | ORAL_TABLET | Freq: Every day | ORAL | Status: DC

## 2012-04-13 ENCOUNTER — Other Ambulatory Visit: Payer: Self-pay | Admitting: Family Medicine

## 2012-04-15 ENCOUNTER — Ambulatory Visit: Payer: Medicaid Other | Attending: Orthopedic Surgery | Admitting: Rehabilitative and Restorative Service Providers"

## 2012-04-15 DIAGNOSIS — IMO0001 Reserved for inherently not codable concepts without codable children: Secondary | ICD-10-CM | POA: Insufficient documentation

## 2012-04-15 DIAGNOSIS — M25619 Stiffness of unspecified shoulder, not elsewhere classified: Secondary | ICD-10-CM | POA: Insufficient documentation

## 2012-04-15 DIAGNOSIS — M6281 Muscle weakness (generalized): Secondary | ICD-10-CM | POA: Insufficient documentation

## 2012-04-23 ENCOUNTER — Ambulatory Visit: Payer: Medicaid Other | Admitting: Physical Therapy

## 2012-04-24 ENCOUNTER — Ambulatory Visit: Payer: Medicaid Other | Admitting: Physical Therapy

## 2012-05-04 ENCOUNTER — Ambulatory Visit: Payer: Medicaid Other | Attending: Orthopedic Surgery | Admitting: Rehabilitative and Restorative Service Providers"

## 2012-05-04 DIAGNOSIS — IMO0001 Reserved for inherently not codable concepts without codable children: Secondary | ICD-10-CM | POA: Insufficient documentation

## 2012-05-04 DIAGNOSIS — M6281 Muscle weakness (generalized): Secondary | ICD-10-CM | POA: Insufficient documentation

## 2012-05-04 DIAGNOSIS — M25619 Stiffness of unspecified shoulder, not elsewhere classified: Secondary | ICD-10-CM | POA: Insufficient documentation

## 2012-05-11 ENCOUNTER — Telehealth: Payer: Self-pay | Admitting: Family Medicine

## 2012-05-11 NOTE — Telephone Encounter (Signed)
Returned call to patient.  Has vertigo again.  Had episode 1 year ago and was given Phenergan.  Patient is lying down now, but feels nausea and dizziness when she stands.  Had med left over and took one dose 1 hour ago without relief.  Thought she may have a sinus infection and has been taking Zyrtec and sinus med.  Denies any ear pain.  Has an appt on Thursday (05/14/12).  Informed patient she may need an appt for evaluation.  No appts available in office today and may need to go to urgent care.  Will route note to Dr. Lula Olszewski for advice and call patient back.  Gaylene Brooks, RN

## 2012-05-11 NOTE — Telephone Encounter (Signed)
I can't find any record of her coming in with dizziness a year ago or Rx for phenergan.  I suspect she does need to be seen if her dizziness continues.  If it has improved, I think she is OK to wait until tomorrow or Thursday.

## 2012-05-11 NOTE — Telephone Encounter (Signed)
Patient woke up this morning with Vertigo and would like to speak to the nurse about what she should do and if this can wait until her appt on Wednesday.

## 2012-05-11 NOTE — Telephone Encounter (Addendum)
Returned call to patient.  Patient is starting to feel better.  Will continue Phenergan as needed and come in on Thursday (05/14/12) for appt with Dr. Lula Olszewski.  Patient will call back for work-in appt if not better or symptoms worsen before upcoming appt.  Gaylene Brooks, RN

## 2012-05-12 ENCOUNTER — Encounter: Payer: Medicaid Other | Admitting: Rehabilitative and Restorative Service Providers"

## 2012-05-14 ENCOUNTER — Encounter: Payer: Self-pay | Admitting: Family Medicine

## 2012-05-14 ENCOUNTER — Ambulatory Visit (INDEPENDENT_AMBULATORY_CARE_PROVIDER_SITE_OTHER): Payer: Medicaid Other | Admitting: Family Medicine

## 2012-05-14 VITALS — BP 130/69 | HR 89 | Temp 98.9°F | Ht 63.0 in | Wt 171.8 lb

## 2012-05-14 DIAGNOSIS — G2581 Restless legs syndrome: Secondary | ICD-10-CM

## 2012-05-14 DIAGNOSIS — G5600 Carpal tunnel syndrome, unspecified upper limb: Secondary | ICD-10-CM

## 2012-05-14 DIAGNOSIS — M069 Rheumatoid arthritis, unspecified: Secondary | ICD-10-CM

## 2012-05-14 DIAGNOSIS — E119 Type 2 diabetes mellitus without complications: Secondary | ICD-10-CM

## 2012-05-14 DIAGNOSIS — F411 Generalized anxiety disorder: Secondary | ICD-10-CM

## 2012-05-14 HISTORY — DX: Restless legs syndrome: G25.81

## 2012-05-14 LAB — POCT GLYCOSYLATED HEMOGLOBIN (HGB A1C): Hemoglobin A1C: 6

## 2012-05-14 MED ORDER — CLONAZEPAM 1 MG PO TABS: 1.0000 mg | ORAL_TABLET | Freq: Three times a day (TID) | ORAL | Status: DC | PRN

## 2012-05-14 MED ORDER — OXYCODONE-ACETAMINOPHEN 10-325 MG PO TABS: 1.0000 | ORAL_TABLET | Freq: Four times a day (QID) | ORAL | Status: DC | PRN

## 2012-05-14 MED ORDER — ROPINIROLE HCL 2 MG PO TABS: 2.0000 mg | ORAL_TABLET | Freq: Every day | ORAL | Status: DC

## 2012-05-14 MED ORDER — MECLIZINE HCL 50 MG PO TABS: 50.0000 mg | ORAL_TABLET | Freq: Three times a day (TID) | ORAL | Status: DC | PRN

## 2012-05-14 MED ORDER — PROMETHAZINE HCL 12.5 MG PO TABS: 12.5000 mg | ORAL_TABLET | Freq: Three times a day (TID) | ORAL | Status: DC | PRN

## 2012-05-14 NOTE — Progress Notes (Signed)
  Subjective:    Patient ID: Michelle [name], female    DOB: Aug 27, 1950, 61 y.o.   MRN: [id number]  HPI  Michelle [name] comes in for follow up.  She has several complaints.   Left hand numbness- hx of carpel tunnel release on R, was never that bad on the left until now.  She wakes up with numbness in her hand.  She denies any injury.  She has a wrist brace at home but isnot wearing it.   Restless legs- she says that the past several months she has been having difficulty sleeping do to creepy crawly feelings in her legs, she cannot rest her legs, she feels like she has to move them.   DM: Patient is taking metformin.  Patient is checking blood sugars.  Fasting sugars range from 80 to 120.  No hyper or hypoglycemic episodes, no polyuria or polydypisa  Chronic pain- from Rheumatoid arthritis.  Her pain is worse.  Her rheumatologist has started her on humara for her disease.   Past Medical History  Diagnosis Date  . RA (rheumatoid arthritis)     CCP positive followed by [facility]  . HLD (hyperlipidemia)   . Diabetes mellitus   . Asthma   . Allergy   . Anxiety   . Cataract     beginning on left eye  . GERD (gastroesophageal reflux disease)   . Osteoporosis    Family History  Problem Relation Age of Onset  . Stroke Father 42    died  . Lung cancer Brother 49    died  . Rheum arthritis Sister 29  . Cancer Sister     breast  . Cancer Daughter     colon  . Colon cancer Maternal Aunt   . Esophageal cancer Neg Hx   . Rectal cancer Neg Hx   . Stomach cancer Neg Hx   . Cancer Mother     uterine  . Cancer Sister     colon   History  Substance Use Topics  . Smoking status: Former Smoker -- 1.5 packs/day for 5 years    Types: Cigarettes    Quit date: 05/31/1994  . Smokeless tobacco: Never Used  . Alcohol Use: No     Review of Systems See HPI    Objective:   Physical Exam BP 130/69  Pulse 89  Temp 98.9 F (37.2 C) (Oral)  Ht 5\' 3"  (1.6 m)  Wt 171 lb 12.8 oz (77.928 kg)  BMI  30.43 kg/m2 General appearance: alert, cooperative and no distress Lungs: clear to auscultation bilaterally Heart: regular rate and rhythm, S1, S2 normal, no murmur, click, rub or gallop Extremities: extremities normal, atraumatic, no cyanosis or edema Pulses: 2+ and symmetric       Assessment & Plan:

## 2012-05-14 NOTE — Assessment & Plan Note (Signed)
Advised to wear wrist brace at night when she is sleeping.  If this does not help will consider having her go to sports medicine.

## 2012-05-14 NOTE — Assessment & Plan Note (Signed)
Currently well controlled, continue metformin at current dose.

## 2012-05-14 NOTE — Patient Instructions (Addendum)
I want you to try Requip for your restless legs at night time.  We can talk about if this is helping at your next visit.   Your A1C today was 6.0, great job.    Try wearing the wrist brace on your left hand at night time, this should help your carpel tunnel.   Please come back and see me in about 3 months.

## 2012-05-14 NOTE — Assessment & Plan Note (Signed)
Will try requip for restless legs.

## 2012-05-14 NOTE — Assessment & Plan Note (Signed)
Will continue current dose of oxycodone.  Hopefully change in RA management will help her pain.

## 2012-05-18 ENCOUNTER — Ambulatory Visit: Payer: Medicaid Other | Admitting: Family Medicine

## 2012-05-19 ENCOUNTER — Encounter: Payer: Medicaid Other | Admitting: Rehabilitative and Restorative Service Providers"

## 2012-05-25 ENCOUNTER — Telehealth: Payer: Self-pay | Admitting: Family Medicine

## 2012-05-25 NOTE — Telephone Encounter (Signed)
Pt calling because her klonopin is not at her pharmacy. Pt has questions about the med for restless leg syndrome and is asking if she can increase the dosage to 2 tabs.Michelle [name] [place]

## 2012-05-25 NOTE — Telephone Encounter (Signed)
According to our records rx was clicked as no print so pt never received.  I called in the Rx and advised I would send the message to MD to ask about the restless leg meds. [place], [name]

## 2012-05-26 NOTE — Telephone Encounter (Signed)
Yes, she can increase the Requip to 2 tablets qhs (4 mg total). Please let her know.

## 2012-05-26 NOTE — Telephone Encounter (Signed)
LMOM advising pt she may inc requip to 2 at bedtime. (total 4mg )

## 2012-06-05 ENCOUNTER — Other Ambulatory Visit: Payer: Self-pay | Admitting: Family Medicine

## 2012-06-08 ENCOUNTER — Other Ambulatory Visit: Payer: Self-pay | Admitting: Family Medicine

## 2012-06-08 DIAGNOSIS — Z1231 Encounter for screening mammogram for malignant neoplasm of breast: Secondary | ICD-10-CM

## 2012-06-29 ENCOUNTER — Other Ambulatory Visit: Payer: Self-pay | Admitting: Family Medicine

## 2012-07-08 ENCOUNTER — Ambulatory Visit (HOSPITAL_COMMUNITY): Payer: Medicaid Other

## 2012-07-10 ENCOUNTER — Other Ambulatory Visit: Payer: Self-pay | Admitting: Family Medicine

## 2012-07-10 DIAGNOSIS — Z1231 Encounter for screening mammogram for malignant neoplasm of breast: Secondary | ICD-10-CM

## 2012-07-13 ENCOUNTER — Ambulatory Visit (INDEPENDENT_AMBULATORY_CARE_PROVIDER_SITE_OTHER): Payer: Medicaid Other | Admitting: Family Medicine

## 2012-07-13 ENCOUNTER — Encounter: Payer: Self-pay | Admitting: Family Medicine

## 2012-07-13 VITALS — BP 139/85 | HR 93 | Temp 98.4°F | Ht 63.0 in | Wt 166.0 lb

## 2012-07-13 DIAGNOSIS — G629 Polyneuropathy, unspecified: Secondary | ICD-10-CM | POA: Insufficient documentation

## 2012-07-13 DIAGNOSIS — K589 Irritable bowel syndrome without diarrhea: Secondary | ICD-10-CM | POA: Insufficient documentation

## 2012-07-13 DIAGNOSIS — J329 Chronic sinusitis, unspecified: Secondary | ICD-10-CM

## 2012-07-13 DIAGNOSIS — G2581 Restless legs syndrome: Secondary | ICD-10-CM

## 2012-07-13 MED ORDER — AZITHROMYCIN 250 MG PO TABS: ORAL_TABLET | ORAL | Status: DC

## 2012-07-13 MED ORDER — GABAPENTIN 300 MG PO CAPS: 300.0000 mg | ORAL_CAPSULE | Freq: Every day | ORAL | Status: DC

## 2012-07-13 NOTE — Assessment & Plan Note (Addendum)
Unclear if this is an acute bacterial sinusitis, but given length of symptoms rx for azithromycin, discussed other symptomatic care.

## 2012-07-13 NOTE — Assessment & Plan Note (Signed)
Restless legs vs. Peripheral neuropathy, did not tolerate requip, will try neurontin qhs.

## 2012-07-13 NOTE — Assessment & Plan Note (Signed)
Will write letter that pt may need frequent rest room breaks if she has to do jury duty.

## 2012-07-13 NOTE — Patient Instructions (Signed)
It was good to see you.  Please take the azithromycin, and use lots of nasal saline rinses and a humidifier for your nasal congestion.    Please try the neurontin, one tablet at bedtime.  Please come back and see me again in one month for your diabetes check up and medication refills.

## 2012-07-13 NOTE — Progress Notes (Signed)
  Subjective:    Patient ID: Michelle [name], female    DOB: 08-Sep-1950, 62 y.o.   MRN: [id number]  HPI  Michelle [name] comes in for a few acute problems:   1) Sinuses: has had nasal congestion on and off for 3 months.  Coughs frequently, cannot breath out of her nose, has headaches and facial pain.  She does not cough anything up or have fevers.  Her daughters are very worried about all the coughing.  They heat their home with wood burning, which drys out her nose.  She says she puts water in pots on the stove and uses nasal saline, but she has had sores inside her nose.    2) Restless legs/Peripheral neuropathy: tried requip, took 1 pill daily for a few nights which did not help, then increased to two.  She says she quit taking it because she threw up right after she took it.  She is wondering if neurontin might help her  3) Jury duty: she has been called up to [employer], she says with her overactive bladder and irritable bowel she is worried about having to go to the bathroom a lot.  She is wondering if I can write a note saying she will need frequent bathroom breaks.   I have reviewed the patient's medical history in detail and updated the computerized patient record.  Review of Systems Pertinent items in HPI    Objective:   Physical Exam BP 139/85  Pulse 93  Temp 98.4 F (36.9 C) (Oral)  Ht 5\' 3"  (1.6 m)  Wt 166 lb (75.297 kg)  BMI 29.41 kg/m2 General appearance: alert, cooperative and no distress Head: sinuses tender to percussion, mild Eyes: conjunctivae/corneas clear. PERRL, EOM's intact. Fundi benign. Ears: normal TM's and external ear canals both ears Nose: clear discharge, turbinates red, swollen Throat: lips, mucosa, and tongue normal; teeth and gums normal Neck: no adenopathy and supple, symmetrical, trachea midline Lungs: clear to auscultation bilaterally Heart: regular rate and rhythm, S1, S2 normal, no murmur, click, rub or gallop Extremities: extremities normal,  atraumatic, no cyanosis or edema Pulses: 2+ and symmetric       Assessment & Plan:

## 2012-07-22 ENCOUNTER — Ambulatory Visit (HOSPITAL_COMMUNITY): Payer: Medicaid Other

## 2012-07-30 ENCOUNTER — Ambulatory Visit (HOSPITAL_COMMUNITY): Payer: Medicaid Other

## 2012-07-31 ENCOUNTER — Other Ambulatory Visit: Payer: Self-pay | Admitting: Family Medicine

## 2012-08-06 ENCOUNTER — Ambulatory Visit (INDEPENDENT_AMBULATORY_CARE_PROVIDER_SITE_OTHER): Payer: Medicaid Other | Admitting: Family Medicine

## 2012-08-06 ENCOUNTER — Encounter: Payer: Self-pay | Admitting: Family Medicine

## 2012-08-06 VITALS — BP 130/81 | HR 80 | Temp 98.0°F | Ht 63.0 in | Wt 167.0 lb

## 2012-08-06 DIAGNOSIS — F411 Generalized anxiety disorder: Secondary | ICD-10-CM

## 2012-08-06 DIAGNOSIS — H9209 Otalgia, unspecified ear: Secondary | ICD-10-CM

## 2012-08-06 DIAGNOSIS — H9202 Otalgia, left ear: Secondary | ICD-10-CM

## 2012-08-06 DIAGNOSIS — F341 Dysthymic disorder: Secondary | ICD-10-CM

## 2012-08-06 DIAGNOSIS — E119 Type 2 diabetes mellitus without complications: Secondary | ICD-10-CM

## 2012-08-06 DIAGNOSIS — M069 Rheumatoid arthritis, unspecified: Secondary | ICD-10-CM

## 2012-08-06 DIAGNOSIS — G629 Polyneuropathy, unspecified: Secondary | ICD-10-CM

## 2012-08-06 DIAGNOSIS — F419 Anxiety disorder, unspecified: Secondary | ICD-10-CM

## 2012-08-06 DIAGNOSIS — G609 Hereditary and idiopathic neuropathy, unspecified: Secondary | ICD-10-CM

## 2012-08-06 LAB — POCT GLYCOSYLATED HEMOGLOBIN (HGB A1C): Hemoglobin A1C: 6.2

## 2012-08-06 MED ORDER — CLONAZEPAM 1 MG PO TABS: 1.0000 mg | ORAL_TABLET | Freq: Three times a day (TID) | ORAL | Status: DC | PRN

## 2012-08-06 MED ORDER — OXYCODONE-ACETAMINOPHEN 10-325 MG PO TABS: 1.0000 | ORAL_TABLET | Freq: Four times a day (QID) | ORAL | Status: DC | PRN

## 2012-08-06 MED ORDER — DICLOFENAC SODIUM 1 % TD GEL: 2.0000 g | Freq: Four times a day (QID) | TRANSDERMAL | Status: DC

## 2012-08-06 MED ORDER — NYSTATIN-TRIAMCINOLONE 100000-0.1 UNIT/GM-% EX CREA: TOPICAL_CREAM | Freq: Two times a day (BID) | CUTANEOUS | Status: DC

## 2012-08-06 NOTE — Assessment & Plan Note (Signed)
Well-controlled, continue current medications

## 2012-08-06 NOTE — Progress Notes (Signed)
Subjective:    Patient ID: Michelle [name], female    DOB: 1951/06/16, 62 y.o.   MRN: [id number]  HPI:  Michelle [name] comes in for follow up.  She is doing relatively well.   DM: Patient is taking metformin.  Patient sometimes is checking blood sugars.  Fasting sugars range from 100 to 150.  No hyper or hypoglycemic episodes, no polyuria or polydypisa  RA- tapered off prednisone, still on MTX and plaquenil.  She has not noticed a difference in her pain control with this.  Is still taking oxycodone, which helps her stay active, and chop wood for her wood heater/fire place. Dr. Jacinto Halim told her he "heard something in her lungs" and asked when her last CXR was.  She says her rheumatologist is following her chest x-rays.  Scheduled to see Rheumatologist next month.   Anxiety- she says she feels OK, still some anxiety with her RA and CV problems.  She is taking the klonopin as prescribed.   Ear Pain-She also complains about intermittent left ear pain. She says it comes and goes, she has not had fevers or chills, but does continue to have chronic nasal congestion.  She says her husband has told her she grinds her teeth at night.  She says she is due to see the dentist soon.   CV- Dr. Nadara Eaton is evaluating her for worsening of her PVD- she is going next Monday for tests on her legs.  However, she says that the gabapentin is helping quite a bit with the pain, she is sleeping much better. She is taking her losartan, Crestor and denies chest pain, LE edema, but does have leg pain with walking that is relieved by rest.  Past Medical History  Diagnosis Date  . RA (rheumatoid arthritis)     CCP positive followed by [facility]  . HLD (hyperlipidemia)   . Diabetes mellitus   . Asthma   . Allergy   . Anxiety   . Cataract     beginning on left eye  . GERD (gastroesophageal reflux disease)   . Osteoporosis     History  Substance Use Topics  . Smoking status: Former Smoker -- 1.5 packs/day for 5 years    Types:  Cigarettes    Quit date: 05/31/1994  . Smokeless tobacco: Never Used  . Alcohol Use: No    Family History  Problem Relation Age of Onset  . Stroke Father 67    died  . Lung cancer Brother 49    died  . Rheum arthritis Sister 45  . Cancer Sister     breast  . Cancer Daughter     colon  . Colon cancer Maternal Aunt   . Esophageal cancer Neg Hx   . Rectal cancer Neg Hx   . Stomach cancer Neg Hx   . Cancer Mother     uterine  . Cancer Sister     colon     ROS Pertinent items in HPI    Objective:  Physical Exam:  BP 130/81  Pulse 80  Temp 98 F (36.7 C) (Oral)  Ht 5\' 3"  (1.6 m)  Wt 167 lb (75.751 kg)  BMI 29.58 kg/m2 General appearance: alert, cooperative and no distress Head: Normocephalic, without obvious abnormality, atraumatic Ears: Normal TM's Jaw: + Click with open and close, tenderness at mandibular joint on left.  Lungs: good air movement, fine crackles heard in bases but no wheezing or rhonchi Heart: regular rate and rhythm, S1, S2 normal, no murmur, click,  rub or gallop Pulses: difficult to palpate DP and PT pulses.        Assessment & Plan:

## 2012-08-06 NOTE — Assessment & Plan Note (Signed)
Continue Klonopin at current dose.

## 2012-08-06 NOTE — Assessment & Plan Note (Signed)
I suspect this is TMJ, not an ear issue.  I discussed this with her, suggested talking to her dentist about a bite guard vs. Trying the OTC moldable one.  Rx for voltaren gel to put on jaw.

## 2012-08-06 NOTE — Patient Instructions (Signed)
It was good to see you today.  Your Hemoglobin A1C is  Lab Results  Component Value Date   HGBA1C 6.2 08/06/2012  .  Remember your goal for A1C is less than 7.  Your goal for fasting morning blood sugar is 80-120.   Your blood pressure today was BP: 130/81 mmHg.  Remember your goal blood pressure is about 130/80.  Please be sure to take your medication every day.  Great Job!  Please tell your rheumatologist that your cardiologist and family medicine doctor heard fine crackles in your lungs and ask when your last chest x-ray was.   I think your ear pain is coming from your jaw- this is called TMJ.  Please talk to your dentist about it or try the over the counter bite guard. I also want you to put voltaren gel on your jaw just in front of your ear for pain.

## 2012-08-06 NOTE — Assessment & Plan Note (Signed)
I have asked her to let her rheumatologist know about the crackles (see pt instructions).  Told her I felt taper of prednisone was a good choice.  Refill for oxycodone x 3 months today.

## 2012-08-06 NOTE — Assessment & Plan Note (Signed)
Improved with gabapentin, unclear if this is complicated by PVD- continue gabapentin and will see what Dr. Jodi Marble ABI's show.

## 2012-08-07 ENCOUNTER — Ambulatory Visit (HOSPITAL_COMMUNITY)
Admission: RE | Admit: 2012-08-07 | Discharge: 2012-08-07 | Disposition: A | Discharge status: Home / Self Care | Payer: Medicaid Other | Source: Ambulatory Visit | Attending: Family Medicine | Admitting: Family Medicine

## 2012-08-07 DIAGNOSIS — Z1231 Encounter for screening mammogram for malignant neoplasm of breast: Secondary | ICD-10-CM | POA: Insufficient documentation

## 2012-08-12 ENCOUNTER — Telehealth: Payer: Self-pay | Admitting: Family Medicine

## 2012-08-12 ENCOUNTER — Encounter: Payer: Self-pay | Admitting: Family Medicine

## 2012-08-12 ENCOUNTER — Ambulatory Visit (INDEPENDENT_AMBULATORY_CARE_PROVIDER_SITE_OTHER): Payer: Medicaid Other | Admitting: Family Medicine

## 2012-08-12 VITALS — BP 129/88 | HR 80 | Temp 98.3°F | Ht 63.0 in | Wt 167.0 lb

## 2012-08-12 DIAGNOSIS — R300 Dysuria: Secondary | ICD-10-CM

## 2012-08-12 DIAGNOSIS — R3915 Urgency of urination: Secondary | ICD-10-CM

## 2012-08-12 DIAGNOSIS — N3001 Acute cystitis with hematuria: Secondary | ICD-10-CM | POA: Insufficient documentation

## 2012-08-12 DIAGNOSIS — N3000 Acute cystitis without hematuria: Secondary | ICD-10-CM | POA: Insufficient documentation

## 2012-08-12 LAB — POCT URINALYSIS DIPSTICK
Protein, UA: NEGATIVE
Spec Grav, UA: <=1.005
Urobilinogen, UA: 0.2

## 2012-08-12 LAB — POCT UA - MICROSCOPIC ONLY

## 2012-08-12 MED ORDER — CEPHALEXIN 500 MG PO CAPS: 500.0000 mg | ORAL_CAPSULE | Freq: Four times a day (QID) | ORAL | Status: DC

## 2012-08-12 NOTE — Telephone Encounter (Signed)
Pt states that she only got 14 Keflex and she is supposed to take 4 per day for 7 days - she did not get enough - pls advise  [facility] [address]

## 2012-08-12 NOTE — Patient Instructions (Addendum)
Please use Keflex for 7 days. Return if you have a fever or develop nausea, vomiting, or flank pain.   Sincerely,   Dr. Clinton Sawyer

## 2012-08-12 NOTE — Assessment & Plan Note (Addendum)
Symptoms and urinalysis concerning for cystitis. Given history of klebsiella UTI sensitive to ceftriaxone, I will use Keflex 500 mg PO BID x 7 days. Patient instructed to return if fever, nausea, vomiting, flank pain. Follow up culture.

## 2012-08-12 NOTE — Progress Notes (Signed)
  Subjective:    Patient ID: Michelle [name], female    DOB: Apr 06, 1951, 62 y.o.   MRN: [id number]  HPI  62 year old F with rhematoid arthritis on chronic immunosuppressive medications who presents for evaluation or urinary frequency. It started last night and is accompanied by pain in her lower abdomen. She denies nausea, vomiting, fever, chills, and back pain. She denies recent UTI  Past Surgical History - total hysterectomy and appendectomy.   Review of Systems See HPI    Objective:   Physical Exam BP 129/88  Pulse 80  Temp(Src) 98.3 F (36.8 C) (Oral)  Ht 5\' 3"  (1.6 m)  Wt 167 lb (75.751 kg)  BMI 29.59 kg/m2  Gen: Elderly WF, well appearing, NAD CV: RRR, no m/r/g Back: no CVA tendernses Abd: mildly distended, mild tenderness in RLQ > LLQ and suprapubic tenderness, NABS  Urinalysis - showed Leukocytes, RBC's, rods      Assessment & Plan:  Symptoms and urinalysis concerning for cystitis. Given history of klebsiella UTI sensitive to ceftriaxone, I will use Keflex 500 mg PO BID x 7 days. Patient instructed to return if fever, nausea, vomiting, flank pain.

## 2012-08-13 MED ORDER — CEPHALEXIN 500 MG PO CAPS: 500.0000 mg | ORAL_CAPSULE | Freq: Four times a day (QID) | ORAL | Status: DC

## 2012-08-13 NOTE — Telephone Encounter (Signed)
Rx sent in

## 2012-08-14 LAB — URINE CULTURE

## 2012-08-16 ENCOUNTER — Encounter: Payer: Self-pay | Admitting: Family Medicine

## 2012-08-17 ENCOUNTER — Telehealth: Payer: Self-pay | Admitting: Family Medicine

## 2012-08-17 NOTE — Telephone Encounter (Signed)
Office visit note states keflex 500 mg twice daily for 7 days. Looks like 500 mg four daily was sent in  by Dr. Clinton Sawyer on 02/12  then Dr. Lula Olszewski sent in another RX because patient called in on the Feb 12 stating she didn't get enough tabs.  Just want to make sure she was actually suppose to take 4 times daily. Will page Dr. Clinton Sawyer to  verify correct dose.

## 2012-08-17 NOTE — Telephone Encounter (Signed)
Pt was supposed to be on Keflex for 7 days and she was only given #14 - taking 4 per day - did not last long enough.  [facility] [address]

## 2012-08-17 NOTE — Telephone Encounter (Signed)
Patient called back and was given the message below.  She said that she feels good, her urine output is good so if she starts to feel symptoms again, she will call back for an appt.

## 2012-08-17 NOTE — Telephone Encounter (Signed)
Spoke with Dr. Lula Olszewski and she advises patient only needs to take Keflex 500 mg twice daily.  Will make sure she received  RX that was sent in By Dr. Lula Olszewski on 02/13.   called and left message with person answering phone  to have patient call back.

## 2012-08-18 NOTE — Telephone Encounter (Addendum)
Spoke with patient  Yesterday afternoon  and  she never picked up second RX sent in by Dr. Lula Olszewski on 02/13. States she took Keflex four times daily for 3.5 days.  Advised to pick up current rx and take one  twice daily ( 500 mg ) for 3.5 additional days. I called pharmacy and changed RX to keflex 500 mg one twice daily # 7 no refill.

## 2012-08-18 NOTE — Telephone Encounter (Addendum)
I agree with this plan and appreciate the efforts of Dr. Lula Olszewski and Ms. Katrinka Blazing, RN.

## 2012-08-25 ENCOUNTER — Other Ambulatory Visit: Payer: Self-pay | Admitting: Family Medicine

## 2012-08-28 ENCOUNTER — Telehealth: Payer: Self-pay | Admitting: Family Medicine

## 2012-08-28 NOTE — Telephone Encounter (Signed)
Pt is asking about needing blood test for different things and she isn't supposed to come back until May to see Dr Lula Olszewski - wants to know if she can make appt sooner for labs.  pls advise

## 2012-08-28 NOTE — Telephone Encounter (Signed)
Spoke with pt.  She would like orders to have her kidney, liver, cholesterol, iron and calcium checked.  She was unable to have this checked at there last visit because it was too early for insurance to pay for it.  She cant get these drawn until after March 7 Will forward to MD for future orders. Fleeger, [name]

## 2012-08-29 ENCOUNTER — Encounter (HOSPITAL_COMMUNITY): Payer: Self-pay

## 2012-08-29 ENCOUNTER — Emergency Department (HOSPITAL_COMMUNITY): Payer: Medicaid Other

## 2012-08-29 ENCOUNTER — Emergency Department (HOSPITAL_COMMUNITY)
Admission: EM | Admit: 2012-08-29 | Discharge: 2012-08-29 | Disposition: A | Discharge status: Home / Self Care | Payer: Medicaid Other | Attending: Emergency Medicine | Admitting: Emergency Medicine

## 2012-08-29 DIAGNOSIS — Z8669 Personal history of other diseases of the nervous system and sense organs: Secondary | ICD-10-CM | POA: Insufficient documentation

## 2012-08-29 DIAGNOSIS — E785 Hyperlipidemia, unspecified: Secondary | ICD-10-CM | POA: Insufficient documentation

## 2012-08-29 DIAGNOSIS — Z79899 Other long term (current) drug therapy: Secondary | ICD-10-CM | POA: Insufficient documentation

## 2012-08-29 DIAGNOSIS — Z7982 Long term (current) use of aspirin: Secondary | ICD-10-CM | POA: Insufficient documentation

## 2012-08-29 DIAGNOSIS — E119 Type 2 diabetes mellitus without complications: Secondary | ICD-10-CM | POA: Insufficient documentation

## 2012-08-29 DIAGNOSIS — Z87891 Personal history of nicotine dependence: Secondary | ICD-10-CM | POA: Insufficient documentation

## 2012-08-29 DIAGNOSIS — F411 Generalized anxiety disorder: Secondary | ICD-10-CM | POA: Insufficient documentation

## 2012-08-29 DIAGNOSIS — M810 Age-related osteoporosis without current pathological fracture: Secondary | ICD-10-CM | POA: Insufficient documentation

## 2012-08-29 DIAGNOSIS — J45909 Unspecified asthma, uncomplicated: Secondary | ICD-10-CM | POA: Insufficient documentation

## 2012-08-29 DIAGNOSIS — R071 Chest pain on breathing: Secondary | ICD-10-CM | POA: Insufficient documentation

## 2012-08-29 DIAGNOSIS — K219 Gastro-esophageal reflux disease without esophagitis: Secondary | ICD-10-CM | POA: Insufficient documentation

## 2012-08-29 DIAGNOSIS — M069 Rheumatoid arthritis, unspecified: Secondary | ICD-10-CM | POA: Insufficient documentation

## 2012-08-29 MED ORDER — ACETAMINOPHEN 325 MG PO TABS
650.0000 mg | ORAL_TABLET | Freq: Once | ORAL | Status: AC
  Administered 2012-08-29: 650 mg via ORAL
  Filled 2012-08-29: qty 2

## 2012-08-29 MED ORDER — OXYCODONE-ACETAMINOPHEN 5-325 MG PO TABS: ORAL_TABLET | ORAL | Status: DC

## 2012-08-29 NOTE — ED Provider Notes (Signed)
History     CSN: [id number]  Arrival date & time 08/29/12  1539   First MD Initiated Contact with Patient 08/29/12 1558      Chief Complaint  Patient presents with  . rib pain     (Consider location/radiation/quality/duration/timing/severity/associated sxs/prior treatment) HPI Comments: 62 y.o. Female presents complaining of left rib pain after reaching down to pick up her dog, hearing a pop a few hours ago. Pt tried to lay down, but could not get comfortable. Pt did not take any medicine for the pain. Pt states she is in 10/10 pain when she breathes in. 8/10 when not taking a deep breath.  Denies cough, shortness of breath, chest pain, nausea, vomiting, numbness, tingling, headache, fevers.  PMHx inxcludes rheumatoid arthritits.  Pt has PCP, but did not call and just came here.    Past Medical History  Diagnosis Date  . RA (rheumatoid arthritis)     CCP positive followed by [facility]  . HLD (hyperlipidemia)   . Diabetes mellitus   . Asthma   . Allergy   . Anxiety   . Cataract     beginning on left eye  . GERD (gastroesophageal reflux disease)   . Osteoporosis     Past Surgical History  Procedure Laterality Date  . Appendectomy    . Cholecystectomy    . Abdominal hysterectomy    . Carpal tunnel release    . Knee surgery      x2  . Hernia repair    . Shoulder surgery      x2  . Right foot bone spurs removed      Family History  Problem Relation Age of Onset  . Stroke Father 28    died  . Lung cancer Brother 49    died  . Rheum arthritis Sister 10  . Cancer Sister     breast  . Cancer Daughter     colon  . Colon cancer Maternal Aunt   . Esophageal cancer Neg Hx   . Rectal cancer Neg Hx   . Stomach cancer Neg Hx   . Cancer Mother     uterine  . Cancer Sister     colon    History  Substance Use Topics  . Smoking status: Former Smoker -- 1.50 packs/day for 5 years    Types: Cigarettes    Quit date: 05/31/1994  . Smokeless tobacco: Never Used  .  Alcohol Use: No    OB History   Grav Para Term Preterm Abortions TAB SAB Ect Mult Living                  Review of Systems  Constitutional: Negative for fever and diaphoresis.  HENT: Negative for neck pain and neck stiffness.   Eyes: Negative for visual disturbance.  Respiratory: Negative for apnea, cough, chest tightness and shortness of breath.        Painful to take a deep breath, tender on left lateral chest wall  Cardiovascular: Negative for chest pain and palpitations.  Gastrointestinal: Negative for nausea, vomiting, diarrhea and constipation.  Genitourinary: Negative for dysuria.  Musculoskeletal: Negative for gait problem.  Skin: Negative for rash.  Neurological: Negative for dizziness, weakness, light-headedness, numbness and headaches.    Allergies  Sulfonamide derivatives  Home Medications   Current Outpatient Rx  Name  Route  Sig  Dispense  Refill  . ACCU-CHEK AVIVA PLUS test strip      TEST BLOOD SUGAR 3 TIMES DAILY   300  strip   5   . aspirin 81 MG chewable tablet   Oral   Chew 81 mg by mouth daily.           Marland Kitchen azithromycin (ZITHROMAX) 250 MG tablet      Take two tablets by mouth today, then one tablet daily x 5 days.   6 each   0   . cephALEXin (KEFLEX) 500 MG capsule   Oral   Take 1 capsule (500 mg total) by mouth 4 (four) times daily.   14 capsule   0   . cetirizine (ZYRTEC) 10 MG tablet   Oral   Take 1 tablet (10 mg total) by mouth daily.   31 tablet   11   . citalopram (CELEXA) 40 MG tablet      TAKE 1 TABLET DAILY   30 tablet   6   . clonazePAM (KLONOPIN) 1 MG tablet   Oral   Take 1 tablet (1 mg total) by mouth 3 (three) times daily as needed. Do not fill until 30 days after date on script.   90 tablet   0   . clonazePAM (KLONOPIN) 1 MG tablet   Oral   Take 1 tablet (1 mg total) by mouth 3 (three) times daily as needed for anxiety. Do not fill until 60 days after date on script.   90 tablet   0   . diclofenac sodium  (VOLTAREN) 1 % GEL   Topical   Apply 2 g topically 4 (four) times daily.   100 g   2   . esomeprazole (NEXIUM) 40 MG capsule   Oral   Take 1 capsule (40 mg total) by mouth daily before breakfast.   90 capsule   3   . fluticasone (FLONASE) 50 MCG/ACT nasal spray   Nasal   Place 1 spray into the nose daily.   16 g   6   . folic acid (FOLVITE) 1 MG tablet   Oral   Take 1 mg by mouth daily.         Marland Kitchen gabapentin (NEURONTIN) 300 MG capsule   Oral   Take 1 capsule (300 mg total) by mouth at bedtime.   30 capsule   3   . hydroxychloroquine (PLAQUENIL) 200 MG tablet      Uses as directed         . ibuprofen (ADVIL,MOTRIN) 600 MG tablet   Oral   Take 1 tablet (600 mg total) by mouth every 12 (twelve) hours as needed for pain.   30 tablet   0   . losartan (COZAAR) 50 MG tablet   Oral   Take 1 tablet (50 mg total) by mouth daily.   90 tablet   3   . meclizine (ANTIVERT) 50 MG tablet   Oral   Take 1 tablet (50 mg total) by mouth 3 (three) times daily as needed for dizziness.   30 tablet   0   . metFORMIN (GLUCOPHAGE) 1000 MG tablet      TAKE 1 TABLET (1,000 MG TOTAL) BY MOUTH 2 (TWO) TIMES DAILY WITH A MEAL.   60 tablet   3   . methotrexate (RHEUMATREX) 10 MG tablet   Oral   Take 10 mg by mouth once a week. Caution: Chemotherapy. Protect from light.         . nitroGLYCERIN (NITROSTAT) 0.4 MG SL tablet   Sublingual   Place 0.4 mg under the tongue every 5 (five) minutes as needed.           Marland Kitchen  nystatin-triamcinolone (MYCOLOG II) cream   Topical   Apply topically 2 (two) times daily.   60 g   0   . oxyCODONE-acetaminophen (PERCOCET) 10-325 MG per tablet   Oral   Take 1 tablet by mouth every 6 (six) hours as needed for pain.   120 tablet   0   . oxyCODONE-acetaminophen (PERCOCET) 10-325 MG per tablet   Oral   Take 1 tablet by mouth every 6 (six) hours as needed for pain. Do not fill until 60 days after date printed.   120 tablet   0   . EXPIRED:  oxyCODONE-acetaminophen (PERCOCET) 10-325 MG per tablet   Oral   Take 1 tablet by mouth every 6 (six) hours as needed for pain. Do not fill until 30 days from date it was printed.   120 tablet   0   . PROAIR HFA 108 (90 BASE) MCG/ACT inhaler      INHALE 2 PUFFS INTO THE LUNGS EVERY 4 (FOUR) HOURS AS NEEDED FOR WHEEZING.   17 each   6   . promethazine (PHENERGAN) 12.5 MG tablet   Oral   Take 1 tablet (12.5 mg total) by mouth every 8 (eight) hours as needed for nausea.   20 tablet   0   . rosuvastatin (CRESTOR) 10 MG tablet   Oral   Take 1 tablet (10 mg total) by mouth daily.   90 tablet   2   . traZODone (DESYREL) 50 MG tablet   Oral   Take 50 mg by mouth at bedtime as needed.            BP 123/76  Pulse 104  Temp(Src) 98.2 F (36.8 C) (Oral)  Resp 20  SpO2 94%  Physical Exam  Nursing note and vitals reviewed. Constitutional: She is oriented to person, place, and time. She appears well-developed and well-nourished. No distress.  HENT:  Head: Normocephalic and atraumatic.  Eyes: Conjunctivae and EOM are normal.  Neck: Normal range of motion. Neck supple.  No meningeal signs  Cardiovascular: Normal rate, regular rhythm and normal heart sounds.  Exam reveals no gallop and no friction rub.   No murmur heard. Pulmonary/Chest: Effort normal and breath sounds normal. No respiratory distress. She has no wheezes. She has no rales.  Equal bilateral expansion. Tender on left lateral chest wall. No skin discoloration or sign of injury.  Abdominal: Soft. Bowel sounds are normal. She exhibits no distension. There is no tenderness. There is no rebound and no guarding.  Musculoskeletal: Normal range of motion. She exhibits no edema and no tenderness.  Neurological: She is alert and oriented to person, place, and time. No cranial nerve deficit.  Skin: Skin is warm and dry. She is not diaphoretic. No erythema.    ED Course  Procedures (including critical care time)  Labs  Reviewed - No data to display No results found. Dg Ribs Unilateral W/chest Left  08/29/2012  *RADIOLOGY REPORT*  Clinical Data: Left rib pain.  LEFT RIBS AND CHEST - 3+ VIEW  Comparison: 03/05/2012  Findings: Heart and mediastinal contours are within normal limits. No focal opacities or effusions.  No acute bony abnormality.  No visible rib fracture.  No pneumothorax.  IMPRESSION: No active cardiopulmonary disease.   Original Report Authenticated By: Charlett Nose, M.D.     Diagnosis: left rib pain    MDM  Pt takes percocet (10/325) a few times a week for pain secondary to rheumatoid arthritis.  Will get rib xray to see  if fxb. PE shows equal bilateral expansion, good lung sound b/l, low suspicion for pneumothorax.   On re-evaluation, vitals are WNL. Imaging shows no fracture. Directed pt to ice injury, prescribed pain meds, and instructed pt to rest the injury when possible.  At this time there does not appear to be any evidence of an acute emergency medical condition and the patient appears stable for discharge with appropriate outpatient follow up.Diagnosis was discussed with patient who verbalizes understanding and is agreeable to discharge. Pt case discussed with Dr. Rubin Payor who agrees with my plan.        Glade Nurse, PA-C 08/30/12 [id number]

## 2012-08-29 NOTE — ED Notes (Signed)
Pt states she was leaning over couch and felt a pop on left side of ribs. Pt states it is painful to bleed. Painful to palpation and movement.

## 2012-08-31 NOTE — ED Provider Notes (Signed)
Medical screening examination/treatment/procedure(s) were performed by non-physician practitioner and as supervising physician I was immediately available for consultation/collaboration.  Jiovany Scheffel R. Zaheer Wageman, MD 08/31/12 0414 

## 2012-09-02 NOTE — Telephone Encounter (Signed)
Labs ordered- please make sure patient knows not to eat or drink anything but water before labs drawn.

## 2012-09-02 NOTE — Telephone Encounter (Signed)
Pt made aware. Fleeger, Michelle [name]

## 2012-09-03 ENCOUNTER — Encounter: Payer: Self-pay | Admitting: Family Medicine

## 2012-09-03 ENCOUNTER — Ambulatory Visit (INDEPENDENT_AMBULATORY_CARE_PROVIDER_SITE_OTHER): Payer: Medicaid Other | Admitting: Family Medicine

## 2012-09-03 VITALS — BP 115/76 | HR 86 | Temp 98.2°F | Ht 63.0 in | Wt 171.0 lb

## 2012-09-03 DIAGNOSIS — R079 Chest pain, unspecified: Secondary | ICD-10-CM

## 2012-09-03 MED ORDER — CYCLOBENZAPRINE HCL 5 MG PO TABS: 5.0000 mg | ORAL_TABLET | Freq: Three times a day (TID) | ORAL | Status: DC | PRN

## 2012-09-03 NOTE — Assessment & Plan Note (Addendum)
Suspect muscle spasm or tear in between ribs with reaching down to ground. Will trial flexeril (informed patient of risk serotonin syndrome). Offered repeat film to see if there is a fracture. Do see history of osteoarthritis on problem list and unclear treatment history so higher risk for fracture. Encouraged patient to follow up with PCP or me in 1 week if doesn't improve. Patient refused additional oxycodone as well.   Pleuritic nature of pain. Wells criteria 0. Doubt pneumothorax as well given no SOB and recent chest film.

## 2012-09-03 NOTE — Patient Instructions (Addendum)
I think you may have pulled a muscle around your ribs. I want you to try flexeril given the muscle spasms you are having. I want you to keep a small pillow with you at all times to push up against the area with deep breaths or coughing as this is seeming to help. You can switch to heat from ice as well given you have tried ice for a week (do this at least 3x each day). Finally, Please follow up with either me or Dr. Lula Olszewski in a week since the pain has not been getting better.   If you develop shortness of breath, leg swelling, or rapid heart beat, please come in immediately.  Thanks for coming to see [place],  Dr. Durene Cal  P.S. Get well soon!   Serotonin Syndrome Serotonin is a brain chemical that regulates the nervous system. Some kinds of drugs increase the amount of serotonin in your body. Drugs that increase the serotonin in your body include:   Anti-depressant medications.  St. John's wort.  Recreational drugs.  Migraine medicines.  Some pain medicines. SYMPTOMS Combining these drugs increases the risk that you will become ill with a toxic condition called serotonin syndrome.  Symptoms of too much serotonin include:  Confusion.  Agitation.  Weakness.  Insomnia.  Fever.  Sweats. Other symptoms that may develop include:  Shakiness.  Muscle spasms.  Seizures. TREATMENT  Hospital treatment is often needed until the effects are controlled.  Avoiding the combination of medicines listed above is recommended.  Check with your doctor if you are concerned about your medicine or the side effects. Document Released: 07/25/2004 Document Revised: 09/09/2011 Document Reviewed: 06/17/2005 [facility] Patient Information 2013 [place], [place].

## 2012-09-03 NOTE — Progress Notes (Signed)
Subjective:   1. Left sided rib pain-patient seen in ED on 3/1 after leaning over and twisting to the side and feeling a "pop" with instant pain up to 8/10 after that. Had film of ribs in ED which did not show any fracture or cardiopulmonary disease. Patient was encouraged to use her home pain meds and ice. She has done that for last 5 days but pain persists and has slightly worsened to 9-10/10. It makes it difficult for her to sleep and to do tasks such as moving around in kitchen. Pain at approximately rib 7-8 on left is located primarily at mid axillary line with some extension 2 inches anterior and 2 inches posterior. Very tender to touch and reproduces pain. No shortness of breath but pain is worth with deep breaths and if she were to cough (not complaining of cough). Nonexertional pain but is related to position. Will spasm at times and become worse in level of pain.   Patient does have osteoporosis and is on chronic prednisone but does not report history of fractures.  No recent travel or prolonged immobilization. No history DVT.   ROS--See HPI. No fever, chills, nausea, vomiting, unintentional weight loss. No dysuria. Does have polyuria which has been a constant problem for 1 year.   Past Medical History Patient Active Problem List  Diagnosis  . DIABETES MELLITUS II, UNCOMPLICATED  . HYPERLIPIDEMIA  . OBESITY, NOS  . Anxiety and depression  . ASTHMA, INTERMITTENT  . GERD  . Rheumatoid arthritis  . COPD (chronic obstructive pulmonary disease)  . Osteoporosis, unspecified  . Gastroparesis  . Irritable bowel  . Rib pain on left side   Reviewed problem list.  Medications- reviewed and updated Chief complaint-noted  Objective: BP 115/76  Pulse 86  Temp(Src) 98.2 F (36.8 C) (Oral)  Ht 5\' 3"  (1.6 m)  Wt 171 lb (77.565 kg)  BMI 30.3 kg/m2 Gen: patient appears uncomfortable with majority of movement. Patient very uncomfortable with palpation of area.  CV: RRR no murmurs rubs or  gallops Chest: pain with palpation along rib 7-8 primarily between ribs along mid axillary line with some extension 2 inches anterior and 2 inches posterior.  Lungs: CTAB no crackles, wheeze, rhonchi Skin: warm, dry, no rash over area  Assessment/Plan:

## 2012-09-07 ENCOUNTER — Other Ambulatory Visit: Payer: Medicaid Other

## 2012-09-07 NOTE — Progress Notes (Signed)
CMP,CBC AND FLP DONE TODAY MARCI HOLDER 

## 2012-09-08 LAB — CBC
HCT: 34.9 % — ABNORMAL LOW (ref 36.0–46.0)
MCHC: 32.7 g/dL (ref 30.0–36.0)
MCV: 91.6 fL (ref 78.0–100.0)
RDW: 16 % — ABNORMAL HIGH (ref 11.5–15.5)

## 2012-09-08 LAB — LIPID PANEL
HDL: 54 mg/dL (ref >39)
Total CHOL/HDL Ratio: 1.7 Ratio
Triglycerides: 61 mg/dL (ref <150)

## 2012-09-08 LAB — COMPREHENSIVE METABOLIC PANEL
AST: 16 U/L (ref 0–37)
Alkaline Phosphatase: 63 U/L (ref 39–117)
BUN: 15 mg/dL (ref 6–23)
Calcium: 9.1 mg/dL (ref 8.4–10.5)
Chloride: 103 mEq/L (ref 96–112)
Creat: 0.53 mg/dL (ref 0.50–1.10)
Glucose, Bld: 109 mg/dL — ABNORMAL HIGH (ref 70–99)

## 2012-09-10 ENCOUNTER — Encounter: Payer: Self-pay | Admitting: Family Medicine

## 2012-09-30 ENCOUNTER — Telehealth: Payer: Self-pay | Admitting: *Deleted

## 2012-09-30 MED ORDER — ATORVASTATIN CALCIUM 40 MG PO TABS: 40.0000 mg | ORAL_TABLET | Freq: Every day | ORAL | Status: DC

## 2012-09-30 NOTE — Telephone Encounter (Signed)
Will d/c Crestor, Rx for Atorvastatin 40 mg PO daily sent in, can we please notify patient?

## 2012-09-30 NOTE — Telephone Encounter (Signed)
Pt. Notified.  .Michelle [name] L  

## 2012-09-30 NOTE — Telephone Encounter (Signed)
Pt also requested recent labs be faxed to Dr. Kelby Aline (rheumatologist, whom we referred in the past).  Will fax labs for patient. Emilie Rutter, Darlyne Russian

## 2012-09-30 NOTE — Telephone Encounter (Signed)
[facility]---Crestor is non-preferred med on Medicaid list.  Preferred meds are generic Lipitor, Mevacor, Pravachol, and Zocor.  Prior authorization form placed in Dr. Melina Modena box for completion.  Gaylene Brooks, RN

## 2012-10-30 ENCOUNTER — Encounter: Payer: Self-pay | Admitting: Family Medicine

## 2012-10-30 ENCOUNTER — Ambulatory Visit (INDEPENDENT_AMBULATORY_CARE_PROVIDER_SITE_OTHER): Payer: Medicaid Other | Admitting: Family Medicine

## 2012-10-30 VITALS — BP 129/72 | HR 79 | Temp 98.7°F | Ht 63.0 in | Wt 169.0 lb

## 2012-10-30 DIAGNOSIS — I10 Essential (primary) hypertension: Secondary | ICD-10-CM

## 2012-10-30 DIAGNOSIS — F419 Anxiety disorder, unspecified: Secondary | ICD-10-CM

## 2012-10-30 DIAGNOSIS — E1159 Type 2 diabetes mellitus with other circulatory complications: Secondary | ICD-10-CM | POA: Insufficient documentation

## 2012-10-30 DIAGNOSIS — E119 Type 2 diabetes mellitus without complications: Secondary | ICD-10-CM

## 2012-10-30 DIAGNOSIS — F341 Dysthymic disorder: Secondary | ICD-10-CM

## 2012-10-30 DIAGNOSIS — F411 Generalized anxiety disorder: Secondary | ICD-10-CM

## 2012-10-30 DIAGNOSIS — M069 Rheumatoid arthritis, unspecified: Secondary | ICD-10-CM

## 2012-10-30 HISTORY — DX: Essential (primary) hypertension: I10

## 2012-10-30 LAB — POCT GLYCOSYLATED HEMOGLOBIN (HGB A1C): Hemoglobin A1C: 6.1

## 2012-10-30 MED ORDER — OXYCODONE-ACETAMINOPHEN 10-325 MG PO TABS: 1.0000 | ORAL_TABLET | Freq: Four times a day (QID) | ORAL | Status: DC | PRN

## 2012-10-30 MED ORDER — CLONAZEPAM 1 MG PO TABS: 1.0000 mg | ORAL_TABLET | Freq: Three times a day (TID) | ORAL | Status: DC | PRN

## 2012-10-30 NOTE — Assessment & Plan Note (Signed)
Relatively well controlled on current medications, will continue Klonopin and Celexa at current dose, 3 month supply of Klonopin Rx today.

## 2012-10-30 NOTE — Assessment & Plan Note (Signed)
Continue Oxycodone at current dose- allows her to continue doing house work and ADL's.  Feel her toe may be RA vs OA vs a bunion forming, suggested using voltaren gel on it, wide to box shoes, using bunion pad on it.

## 2012-10-30 NOTE — Assessment & Plan Note (Signed)
Well controlled, continue Losartan at current dose.

## 2012-10-30 NOTE — Patient Instructions (Signed)
It was good to see you today.  Your Hemoglobin A1C is  Lab Results  Component Value Date   HGBA1C 6.1 10/30/2012  .  Remember your goal for A1C is less than 7. Great Job, please continue your metformin.   Your blood pressure today was BP: 129/72 mmHg.  Remember your goal blood pressure is about 130/80.  Please be sure to take your medication every day.  Great Job!  Please try the voltaren gel on your toe, wear wide toed shoes, and you can try a bunion pad on it.  Please also talk to your Rheumatologist about it.

## 2012-10-30 NOTE — Progress Notes (Signed)
  Subjective:    Patient ID: Michelle [name], female    DOB: 1951-05-13, 62 y.o.   MRN: [id number]  HPI:  [name] comes in for follow up.   DM: Patient is taking metformin 1000 mg PO daily.  Patient is not checking blood sugars. No hyper or hypoglycemic episodes, no polyuria or polydipsia.  She does ask about being on Victoza instead, says her pharmacist said Medicaid would cover it.    HTN: Taking Losartan without difficulty.  Denies chest pain, dizziness, palpitations, LE edema.  Patient does not check blood pressures.   RA and chronic Pain: Now on Humira and Methotrexate only.  Says her pain is worse but controlled with oxycodone. She takes about 3 oxycodone per day, sometimes a 4th if she needs it.  She is trying to stay active and the pain medication helps with this.  She has a bump her R 5th toe that hurts and seems like the joint is larger, she is not sure if this is RA, OA, or something else.   Anxiety and Depression: Taking Celexa every day, taking klonopin usually twice a day.  She says she still worries a lot about her health, about having cancer, about her children, but these medications make her able to cope and control it.   Past Medical History  Diagnosis Date  . RA (rheumatoid arthritis)     CCP positive followed by [facility]  . HLD (hyperlipidemia)   . Diabetes mellitus   . Asthma   . Allergy   . Anxiety   . Cataract     beginning on left eye  . GERD (gastroesophageal reflux disease)   . Osteoporosis     History  Substance Use Topics  . Smoking status: Former Smoker -- 1.50 packs/day for 5 years    Types: Cigarettes    Quit date: 05/31/1994  . Smokeless tobacco: Never Used  . Alcohol Use: No    Family History  Problem Relation Age of Onset  . Stroke Father 57    died  . Lung cancer Brother 49    died  . Rheum arthritis Sister 32  . Cancer Sister     breast  . Cancer Daughter     colon  . Colon cancer Maternal Aunt   . Esophageal cancer Neg Hx   . Rectal  cancer Neg Hx   . Stomach cancer Neg Hx   . Cancer Mother     uterine  . Cancer Sister     colon     ROS Pertinent items in HPI    Objective:  Physical Exam:  BP 129/72  Pulse 79  Temp(Src) 98.7 F (37.1 C) (Oral)  Ht 5\' 3"  (1.6 m)  Wt 169 lb (76.658 kg)  BMI 29.94 kg/m2 General appearance: alert, cooperative and no distress Head: Normocephalic, without obvious abnormality, atraumatic Lungs: clear to auscultation bilaterally Heart: regular rate and rhythm, S1, S2 normal, no murmur, click, rub or gallop Pulses: 2+ and symmetric R foot: Arthritic changes of foot, R 5th toe with some enlargement of PIP joint, but no erythema or effusion.        Assessment & Plan:

## 2012-10-30 NOTE — Assessment & Plan Note (Signed)
Currently well controlled on Metformin alone.  Again discussed that I do not think there is any Benefit to switching to Victoza, and that it will be much more expensive.  Continue metformin at current dose.

## 2012-11-10 DIAGNOSIS — M069 Rheumatoid arthritis, unspecified: Secondary | ICD-10-CM | POA: Insufficient documentation

## 2012-11-13 ENCOUNTER — Other Ambulatory Visit: Payer: Self-pay | Admitting: Family Medicine

## 2012-11-17 ENCOUNTER — Other Ambulatory Visit: Payer: Self-pay | Admitting: Family Medicine

## 2012-11-18 ENCOUNTER — Ambulatory Visit (HOSPITAL_COMMUNITY)
Admission: RE | Admit: 2012-11-18 | Discharge: 2012-11-18 | Disposition: A | Discharge status: Home / Self Care | Payer: Medicaid Other | Source: Ambulatory Visit | Attending: Family Medicine | Admitting: Family Medicine

## 2012-11-18 ENCOUNTER — Ambulatory Visit (INDEPENDENT_AMBULATORY_CARE_PROVIDER_SITE_OTHER): Payer: Medicaid Other | Admitting: Emergency Medicine

## 2012-11-18 VITALS — BP 124/86 | HR 80 | Temp 98.4°F | Ht 63.0 in | Wt 170.0 lb

## 2012-11-18 DIAGNOSIS — M25551 Pain in right hip: Secondary | ICD-10-CM

## 2012-11-18 DIAGNOSIS — R109 Unspecified abdominal pain: Secondary | ICD-10-CM | POA: Insufficient documentation

## 2012-11-18 DIAGNOSIS — M25559 Pain in unspecified hip: Secondary | ICD-10-CM

## 2012-11-18 HISTORY — DX: Pain in unspecified hip: M25.559

## 2012-11-18 LAB — BASIC METABOLIC PANEL
BUN: 9 mg/dL (ref 6–23)
Calcium: 9.5 mg/dL (ref 8.4–10.5)
Creat: 0.53 mg/dL (ref 0.50–1.10)
Glucose, Bld: 100 mg/dL — ABNORMAL HIGH (ref 70–99)

## 2012-11-18 LAB — CBC WITH DIFFERENTIAL/PLATELET
Basophils Absolute: 0 10*3/uL (ref 0.0–0.1)
Basophils Relative: 0 % (ref 0–1)
Eosinophils Absolute: 0.2 10*3/uL (ref 0.0–0.7)
Eosinophils Relative: 3 % (ref 0–5)
HCT: 36.1 % (ref 36.0–46.0)
MCH: 30.3 pg (ref 26.0–34.0)
MCHC: 33.2 g/dL (ref 30.0–36.0)
Monocytes Absolute: 0.6 10*3/uL (ref 0.1–1.0)
Monocytes Relative: 6 % (ref 3–12)
Neutro Abs: 6.2 10*3/uL (ref 1.7–7.7)
RDW: 16 % — ABNORMAL HIGH (ref 11.5–15.5)

## 2012-11-18 MED ORDER — IBUPROFEN 600 MG PO TABS: 600.0000 mg | ORAL_TABLET | Freq: Three times a day (TID) | ORAL | Status: DC | PRN

## 2012-11-18 MED ORDER — OXYCODONE-ACETAMINOPHEN 10-325 MG PO TABS: 1.0000 | ORAL_TABLET | Freq: Three times a day (TID) | ORAL | Status: DC | PRN

## 2012-11-18 NOTE — Patient Instructions (Addendum)
It was nice to meet you! I'm sorry you are having pain. We are getting an x-ray and labs today. I have given you some extra percocet. I also sent in ibuprofen 600mg  tablets, take 1 3 times a day as needed. Follow up on Friday.   If you are getting worse or have a fever, come back sooner.

## 2012-11-18 NOTE — Progress Notes (Signed)
  Subjective:    Patient ID: Michelle [name], female    DOB: 04-22-1951, 62 y.o.   MRN: [id number]  HPI Michelle [name] is here for a SDA for right groin pain.  She reports sudden onset of right groin pain last night.  Worse with any movement of her hip.  She has known RA and chronic joint pain, but this is a new pain for her.  The pain is improved some with her percocet, but she has been trying not to use it.  She denies any trauma or injury to the hip/groin. Denies fevers, chills, recent straining or heavy lifting.  Denies bulging in the groin.  Does state that her urine as been dark yellow, but denies dysuria.    I have reviewed and updated the following as appropriate: allergies and current medications SHx: former smoker  Review of Systems See HPI    Objective:   Physical Exam BP 124/86  Pulse 80  Temp(Src) 98.4 F (36.9 C) (Oral)  Ht 5\' 3"  (1.6 m)  Wt 170 lb (77.111 kg)  BMI 30.12 kg/m2 Gen: alert, cooperative, appears uncomfortable Right hip: no erythema, warmth or swelling appreciated; mild tenderness over greater trochanter, very tender over inguinal ligament by the pubic bone; ROM limited due to pain in flexion and rotation (both active and passive) Right knee: normal ROM without pain Ext: right foot warm and well perfused, no swelling     Assessment & Plan:

## 2012-11-18 NOTE — Assessment & Plan Note (Addendum)
Concern for fracture vs infection given that she is on immunosuppresive therapy for RA.  No fever, warmth or swelling of the joint.  Will check an x-ray of the right hip, cbc and bmp.  Ibuprofen 600mg  TID prn for pain.  Will also give an extra 20 percocet.  Follow up on Friday to reassess.  Reviewed reasons to return sooner including fevers or worsening pain.  Patient understands and agrees with plan.   X-ray negative for fracture/subluxation.

## 2012-11-20 ENCOUNTER — Encounter: Payer: Self-pay | Admitting: Family Medicine

## 2012-11-20 ENCOUNTER — Ambulatory Visit: Payer: Medicaid Other | Admitting: Family Medicine

## 2012-11-20 ENCOUNTER — Ambulatory Visit (INDEPENDENT_AMBULATORY_CARE_PROVIDER_SITE_OTHER): Payer: Medicaid Other | Admitting: Family Medicine

## 2012-11-20 VITALS — BP 146/72 | HR 70 | Temp 98.7°F | Ht 63.0 in | Wt 168.2 lb

## 2012-11-20 DIAGNOSIS — M25551 Pain in right hip: Secondary | ICD-10-CM

## 2012-11-20 DIAGNOSIS — M25559 Pain in unspecified hip: Secondary | ICD-10-CM

## 2012-11-20 NOTE — Patient Instructions (Addendum)
Your labs look normal. I think you have some pain from your hip joint, arthritis. I would recommend taking your motrin or percocet as needed. You may need to see your orthopedist if hip pain worsens again.

## 2012-11-21 NOTE — Progress Notes (Signed)
  Subjective:    Patient ID: Michelle [name], female    DOB: 1951/01/16, 62 y.o.   MRN: [id number]  Hip Pain     1. F/u right hip pain.  62 yo female with RA follows up for hip pain. Seen two days ago with acute spontaneous right hip pain. Underwent labs with xrays showing degenerative changes otherwise no acute fractures or lesions. Her WBC was normal. She reports that since her last evaluation, her hip is much improved, currently down to a 3/10 with movement only. She has required only 2-3 extra dose of pain medication since the pain began. She is now able to walk without much of a limp. She has not had any fevers, swelling, falls, numbness, weakness, night sweats, dysuria. The pain is mainly in posterior aspect and her groin.  She reports that she has been seeing an orthopedist (Dr. Olga Millers at [facility]), and has plans for a carpal tunnel release surgery next week.   Review of Systems See HPI otherwise negative.  reports that she quit smoking about 18 years ago. Her smoking use included Cigarettes. She has a 7.5 pack-year smoking history. She has never used smokeless tobacco.     Objective:   Physical Exam  Vitals reviewed. Constitutional: She appears well-developed and well-nourished. No distress.  HENT:  Head: Normocephalic and atraumatic.  Musculoskeletal:  Mildly antalgic gait. Decreased internal rotation of hips in L>R. Pain with internal rotation on right. Some mild TTP in right PSIS. Negative straight leg test and cross leg test. No TTP in groin or trochanteric bursa.   Skin: No rash noted. She is not diaphoretic.  Psychiatric: She has a normal mood and affect.        Assessment & Plan:

## 2012-11-21 NOTE — Assessment & Plan Note (Signed)
Much improved since onset. Not likely infectious given the spontaneous improvement, normal WBC and absence of systemic symptoms. She likely does have some arthritis evidenced by her plain films and history of RA, and reduced internal rotation on exam. We discussed her continuation of chronic pain medications for now. No new meds given. Can use NSAID for breakthrough. I advised her that she may want her Ortho to evaluate if the symptoms deteriorate again.

## 2012-12-07 ENCOUNTER — Ambulatory Visit (INDEPENDENT_AMBULATORY_CARE_PROVIDER_SITE_OTHER): Payer: Medicaid Other | Admitting: Family Medicine

## 2012-12-07 ENCOUNTER — Encounter: Payer: Self-pay | Admitting: Family Medicine

## 2012-12-07 VITALS — BP 120/83 | HR 87 | Ht 63.0 in | Wt 170.2 lb

## 2012-12-07 DIAGNOSIS — L259 Unspecified contact dermatitis, unspecified cause: Secondary | ICD-10-CM

## 2012-12-07 DIAGNOSIS — L309 Dermatitis, unspecified: Secondary | ICD-10-CM

## 2012-12-07 MED ORDER — TRIAMCINOLONE ACETONIDE 0.1 % EX CREA: TOPICAL_CREAM | Freq: Two times a day (BID) | CUTANEOUS | Status: DC
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
## 2012-12-07 NOTE — Assessment & Plan Note (Signed)
Non specific dermatitis.  No infection.  Will try triamcinolone cream BID with zyrtec to help with itching. F/u if not improving by the end of the week for possible skin bx.

## 2012-12-09 ENCOUNTER — Other Ambulatory Visit: Payer: Self-pay | Admitting: Family Medicine

## 2012-12-09 NOTE — Telephone Encounter (Signed)
Requested Prescriptions   Pending Prescriptions Disp Refills  . ACCU-CHEK AVIVA PLUS test strip [Pharmacy Med Name: ACCU-CHEK AVIVA PLUS TEST STRP]  0    Sig: TEST BLOOD SUGAR 3 TIMES DAILY  . citalopram (CELEXA) 40 MG tablet      Sig: Take 1 tablet (40 mg total) by mouth daily.   Wyatt Haste, RN-BSN

## 2012-12-14 MED ORDER — CITALOPRAM HYDROBROMIDE 40 MG PO TABS: 40.0000 mg | ORAL_TABLET | Freq: Every day | ORAL | Status: DC

## 2012-12-14 NOTE — Telephone Encounter (Signed)
This patient is calling because she has now waited much longer than 24 - 48 hours for these prescriptions to be refilled.  She needs her meds and test strips to be refilled today.

## 2012-12-15 ENCOUNTER — Telehealth: Payer: Self-pay | Admitting: Family Medicine

## 2012-12-15 MED ORDER — GLUCOSE BLOOD VI STRP: ORAL_STRIP | Status: DC

## 2012-12-15 MED ORDER — ONETOUCH LANCETS MISC: Status: DC

## 2012-12-15 MED ORDER — ONETOUCH ULTRA SYSTEM W/DEVICE KIT: 1.0000 | PACK | Freq: Once | Status: DC

## 2012-12-15 NOTE — Telephone Encounter (Signed)
Rx sent 

## 2012-12-15 NOTE — Telephone Encounter (Signed)
Please e scribe new glucometer and strips that are approved for Medicare to pt pharmacy. The test strips she requested yesterday are for the meter that is now broken.Please let me know when this is done and I will call pt. Wyatt Haste, RN-BSN

## 2012-12-15 NOTE — Telephone Encounter (Signed)
Patient has Medicaid, she has had her meter for 10+ years and it is no longer working and she needs a new meter.  She was last seen in the office on 6/9 and she is very concerned about not being able to test her sugars.  What can she do about getting a new meter?

## 2013-01-07 ENCOUNTER — Other Ambulatory Visit: Payer: Self-pay

## 2013-01-08 ENCOUNTER — Other Ambulatory Visit: Payer: Self-pay | Admitting: Family Medicine

## 2013-01-08 MED ORDER — ACCU-CHEK AVIVA PLUS W/DEVICE KIT: 1.0000 | PACK | Freq: Every day | Status: DC

## 2013-01-08 MED ORDER — GLUCOSE BLOOD VI STRP: ORAL_STRIP | Status: DC

## 2013-01-08 MED ORDER — LANCETS MISC: 1.0000 | Freq: Every day | Status: DC

## 2013-01-08 NOTE — Telephone Encounter (Signed)
Will refill cetirizine. For nexium refill patient will have to be seen as this had been discontinued in her medications. Please advise patient of this.

## 2013-01-08 NOTE — Telephone Encounter (Signed)
The device that Dr. Lula Olszewski prescribed on 6/17 is not covered by Medicaid.  [facility] says that the Accuchek Aviva Plus, with Lancets and Test Strips is what is covered by Medicaid so they need a Rx for this.  Also, this patient has been waiting for this for several weeks.  She really needs this to happen right away.

## 2013-01-08 NOTE — Telephone Encounter (Signed)
Have sent in Rx for these items.

## 2013-01-08 NOTE — Telephone Encounter (Signed)
PLease see note below and send escript for Accucheck Aviva PLus with Lancets and test strips is covered by Medicaid.  Wyatt Haste, RN-BSN

## 2013-01-08 NOTE — Addendum Note (Signed)
Addended by: Birdie Sons Jakub Debold G on: 01/08/2013 01:44 PM   Modules accepted: Orders

## 2013-01-12 ENCOUNTER — Ambulatory Visit (INDEPENDENT_AMBULATORY_CARE_PROVIDER_SITE_OTHER): Payer: Medicaid Other | Admitting: Family Medicine

## 2013-01-12 ENCOUNTER — Encounter: Payer: Self-pay | Admitting: Family Medicine

## 2013-01-12 VITALS — BP 124/73 | HR 105 | Temp 97.8°F | Ht 63.0 in | Wt 170.0 lb

## 2013-01-12 DIAGNOSIS — G894 Chronic pain syndrome: Secondary | ICD-10-CM

## 2013-01-12 DIAGNOSIS — E119 Type 2 diabetes mellitus without complications: Secondary | ICD-10-CM

## 2013-01-12 DIAGNOSIS — K219 Gastro-esophageal reflux disease without esophagitis: Secondary | ICD-10-CM

## 2013-01-12 DIAGNOSIS — F419 Anxiety disorder, unspecified: Secondary | ICD-10-CM

## 2013-01-12 DIAGNOSIS — F341 Dysthymic disorder: Secondary | ICD-10-CM

## 2013-01-12 DIAGNOSIS — M069 Rheumatoid arthritis, unspecified: Secondary | ICD-10-CM

## 2013-01-12 DIAGNOSIS — F329 Major depressive disorder, single episode, unspecified: Secondary | ICD-10-CM

## 2013-01-12 DIAGNOSIS — L989 Disorder of the skin and subcutaneous tissue, unspecified: Secondary | ICD-10-CM

## 2013-01-12 DIAGNOSIS — J45909 Unspecified asthma, uncomplicated: Secondary | ICD-10-CM

## 2013-01-12 DIAGNOSIS — F411 Generalized anxiety disorder: Secondary | ICD-10-CM

## 2013-01-12 MED ORDER — BECLOMETHASONE DIPROPIONATE 40 MCG/ACT IN AERS: 2.0000 | INHALATION_SPRAY | Freq: Two times a day (BID) | RESPIRATORY_TRACT | Status: DC

## 2013-01-12 MED ORDER — CLONAZEPAM 1 MG PO TABS: 1.0000 mg | ORAL_TABLET | Freq: Two times a day (BID) | ORAL | Status: DC | PRN

## 2013-01-12 MED ORDER — ESOMEPRAZOLE MAGNESIUM 40 MG PO CPDR: 40.0000 mg | DELAYED_RELEASE_CAPSULE | Freq: Every day | ORAL | Status: DC

## 2013-01-12 MED ORDER — OXYCODONE-ACETAMINOPHEN 10-325 MG PO TABS: 1.0000 | ORAL_TABLET | Freq: Four times a day (QID) | ORAL | Status: DC | PRN

## 2013-01-12 MED ORDER — GABAPENTIN 300 MG PO CAPS: ORAL_CAPSULE | ORAL | Status: DC

## 2013-01-12 MED ORDER — ALBUTEROL SULFATE HFA 108 (90 BASE) MCG/ACT IN AERS: 2.0000 | INHALATION_SPRAY | Freq: Four times a day (QID) | RESPIRATORY_TRACT | Status: DC | PRN

## 2013-01-12 MED ORDER — METFORMIN HCL 1000 MG PO TABS: ORAL_TABLET | ORAL | Status: DC

## 2013-01-12 NOTE — Patient Instructions (Signed)
Nice to meet you. I have refilled all of your medications.  For your asthma, I want you to use an additional inhaler called qvar. You will use this every day. Hopefully this will cut down on the use of albuterol. I will have you come back in a month to see how your asthma is doing and to get an A1c.

## 2013-01-13 ENCOUNTER — Telehealth: Payer: Self-pay | Admitting: Family Medicine

## 2013-01-13 DIAGNOSIS — G894 Chronic pain syndrome: Secondary | ICD-10-CM | POA: Insufficient documentation

## 2013-01-13 DIAGNOSIS — L989 Disorder of the skin and subcutaneous tissue, unspecified: Secondary | ICD-10-CM | POA: Insufficient documentation

## 2013-01-13 NOTE — Telephone Encounter (Signed)
Pt is requesting that Dr. De Nurse call Medicaid so that the pharmacy will fill her prescription of Nexium. JW

## 2013-01-13 NOTE — Assessment & Plan Note (Signed)
Stable on klonipin. States after this 3 month supply would like to decrease usage. Plan: refill klonipin 1 mg BID prn #60 with Rx for 2 additional months.

## 2013-01-13 NOTE — Assessment & Plan Note (Addendum)
Likely a bite of some sort. Suspect spider bite. Can do benadryl for itching. Advised to return if erythema spreads.

## 2013-01-13 NOTE — Assessment & Plan Note (Signed)
Stable on nexium. Will refill this medication.

## 2013-01-13 NOTE — Progress Notes (Signed)
  Subjective:    Patient ID: Michelle [name], female    DOB: [birth year], 61 y.o.   MRN: [id number]  HPI Patient is a 62 yo female who presents for medication refills and DM f/u.  DIABETES Disease Monitoring: not checking at home Blood Sugar ranges-n/a      Visual problems- states has blurry vision, recently saw the eye doctor Medications: Metformin Compliance- taking No sensory changes  Medication refill: Nexium: helps significantly with reflux, has previously had endoscopy Gabapentin: for leg pain, was placed on this due to throbbing in legs, helped significantly Percocet: uses for chronic pain everyday. Klonipin: uses for anxiety, takes 3-4 days a week  Asthma/COPD: Proair: using about 4 days a week, multiple times per day. Denies night time coughing episodes. Is not on controller medication. Notes getting short of breath that causes her to use albuterol, stating this helps.  Also notes bite on left arm. On Saturday. Feels like she was bit. Developed a small blister. Does not hurt. Has been red.  PMH: smoking status noted  Review of Systems see HPI     Objective:   Physical Exam  Constitutional: She appears well-developed and well-nourished.  HENT:  Head: Normocephalic and atraumatic.  Cardiovascular: Normal rate, regular rhythm and normal heart sounds.   Pulmonary/Chest: Effort normal and breath sounds normal. No respiratory distress. She has no wheezes. She has no rales.  Musculoskeletal: She exhibits no edema and no tenderness.  Neurological: She is alert.  Skin: Skin is warm and dry.  Left fore arm: small 3 cm diameter erythematous area with central excoriation, non-tender, no apparent warmth or surrounding induration/fluctuation   BP 124/73  Pulse 105  Temp(Src) 97.8 F (36.6 C) (Oral)  Ht 5\' 3"  (1.6 m)  Wt 170 lb (77.111 kg)  BMI 30.12 kg/m2    Assessment & Plan:

## 2013-01-13 NOTE — Assessment & Plan Note (Signed)
Patient overly using albuterol due to feeling short of breath. Probably more likely COPD as opposed to asthma given previous pre and post bronchodilator scores. Plan: will start qvar 40 mcg/act 2 puffs BID, continue albuterol prn

## 2013-01-13 NOTE — Assessment & Plan Note (Signed)
Stable on percocet and gabapentin. Will continue these medications.

## 2013-01-13 NOTE — Assessment & Plan Note (Addendum)
Taking metformin. Due for A1c at beginning of august. Will check at f/u appointment.  Addendum: patient with retinal images taken revealing mild non-proliferative diabetic retinopathy. Will need f/u images in 6-12 months.

## 2013-01-14 NOTE — Telephone Encounter (Signed)
Spoke with [facility], they are faxing request for prior authorization to [place].  Jesica Goheen, Darlyne Russian, CMA

## 2013-01-15 ENCOUNTER — Telehealth: Payer: Self-pay | Admitting: *Deleted

## 2013-01-15 NOTE — Telephone Encounter (Signed)
Prior auth. Put into [place] tracks - awaiting approval - confirmation # [id number] w Wyatt Haste, RN-BSN

## 2013-01-15 NOTE — Telephone Encounter (Signed)
Received prior authorization form from [facility] for Nexium.  Form placed in doctor's box for completion, attached are the preferred drugs. Will have md return form after completion to me. Wyatt Haste, RN-BSN

## 2013-01-20 DIAGNOSIS — E119 Type 2 diabetes mellitus without complications: Secondary | ICD-10-CM

## 2013-01-20 NOTE — Telephone Encounter (Signed)
Pt calling to check on prior approval..informed that we were waiting on approval - [place] Tracks checked and approval has been granted through 01/15/2014. Pharmacy to be called. Wyatt Haste, RN-BSN

## 2013-01-20 NOTE — Telephone Encounter (Signed)
[facility] called and informed that prior approval has been granted for nexium. Wyatt Haste, RN-BSN

## 2013-01-26 ENCOUNTER — Ambulatory Visit: Payer: Medicaid Other | Admitting: Family Medicine

## 2013-02-03 ENCOUNTER — Encounter: Payer: Self-pay | Admitting: Family Medicine

## 2013-02-03 ENCOUNTER — Ambulatory Visit (HOSPITAL_COMMUNITY)
Admission: RE | Admit: 2013-02-03 | Discharge: 2013-02-03 | Disposition: A | Discharge status: Home / Self Care | Payer: Medicaid Other | Source: Ambulatory Visit | Attending: Family Medicine | Admitting: Family Medicine

## 2013-02-03 ENCOUNTER — Ambulatory Visit (INDEPENDENT_AMBULATORY_CARE_PROVIDER_SITE_OTHER): Payer: Medicaid Other | Admitting: Family Medicine

## 2013-02-03 VITALS — BP 106/71 | HR 85 | Temp 98.2°F | Wt 169.0 lb

## 2013-02-03 DIAGNOSIS — R51 Headache: Secondary | ICD-10-CM

## 2013-02-03 DIAGNOSIS — R9431 Abnormal electrocardiogram [ECG] [EKG]: Secondary | ICD-10-CM | POA: Insufficient documentation

## 2013-02-03 DIAGNOSIS — R079 Chest pain, unspecified: Secondary | ICD-10-CM | POA: Insufficient documentation

## 2013-02-03 DIAGNOSIS — E119 Type 2 diabetes mellitus without complications: Secondary | ICD-10-CM

## 2013-02-03 NOTE — Patient Instructions (Addendum)
Nice to see you. I have ordered an X-ray of your neck to see if this is the cause of your headache. You can go over to the radiology department in the hospital to have this done. You can continue to take aleve or tylenol for your headache. Please call Dr. Jacinto Halim to set up a follow-up appointment. I don't think your chest pain is related to your heart, but I would like to see what he has to say.

## 2013-02-04 DIAGNOSIS — R519 Headache, unspecified: Secondary | ICD-10-CM | POA: Insufficient documentation

## 2013-02-04 DIAGNOSIS — R079 Chest pain, unspecified: Secondary | ICD-10-CM | POA: Insufficient documentation

## 2013-02-04 DIAGNOSIS — R51 Headache: Secondary | ICD-10-CM | POA: Insufficient documentation

## 2013-02-04 NOTE — Assessment & Plan Note (Addendum)
Description sounds atypical and EKG reassuring on day of visit. Patient with family history of MI. Had normal stress test a year ago.  Plan: advised to f/u with Dr. Jacinto Halim for consideration of further intervention.
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
## 2013-02-05 ENCOUNTER — Other Ambulatory Visit: Payer: Self-pay | Admitting: Family Medicine

## 2013-02-05 ENCOUNTER — Ambulatory Visit (HOSPITAL_COMMUNITY)
Admission: RE | Admit: 2013-02-05 | Discharge: 2013-02-05 | Disposition: A | Discharge status: Home / Self Care | Payer: Medicaid Other | Source: Ambulatory Visit | Attending: Family Medicine | Admitting: Family Medicine

## 2013-02-05 DIAGNOSIS — E119 Type 2 diabetes mellitus without complications: Secondary | ICD-10-CM

## 2013-02-05 DIAGNOSIS — R51 Headache: Secondary | ICD-10-CM

## 2013-02-05 DIAGNOSIS — M542 Cervicalgia: Secondary | ICD-10-CM | POA: Insufficient documentation

## 2013-02-08 ENCOUNTER — Other Ambulatory Visit: Payer: Medicaid Other

## 2013-02-08 DIAGNOSIS — E119 Type 2 diabetes mellitus without complications: Secondary | ICD-10-CM

## 2013-02-08 LAB — BASIC METABOLIC PANEL
BUN: 11 mg/dL (ref 6–23)
Chloride: 100 mEq/L (ref 96–112)
Glucose, Bld: 107 mg/dL — ABNORMAL HIGH (ref 70–99)
Potassium: 4 mEq/L (ref 3.5–5.3)

## 2013-02-08 NOTE — Progress Notes (Signed)
BMP DONE TODAY Michelle [name] 

## 2013-02-15 ENCOUNTER — Ambulatory Visit (HOSPITAL_COMMUNITY)
Admission: RE | Admit: 2013-02-15 | Discharge: 2013-02-15 | Disposition: A | Discharge status: Home / Self Care | Payer: Medicaid Other | Source: Ambulatory Visit | Attending: Family Medicine | Admitting: Family Medicine

## 2013-02-15 DIAGNOSIS — I6789 Other cerebrovascular disease: Secondary | ICD-10-CM | POA: Insufficient documentation

## 2013-02-15 DIAGNOSIS — R51 Headache: Secondary | ICD-10-CM | POA: Insufficient documentation

## 2013-02-15 MED ORDER — GADOBENATE DIMEGLUMINE 529 MG/ML IV SOLN
15.0000 mL | Freq: Once | INTRAVENOUS | Status: AC | PRN
  Administered 2013-02-15: 15 mL via INTRAVENOUS

## 2013-02-17 ENCOUNTER — Ambulatory Visit (INDEPENDENT_AMBULATORY_CARE_PROVIDER_SITE_OTHER): Payer: Medicaid Other | Admitting: Family Medicine

## 2013-02-17 ENCOUNTER — Encounter: Payer: Self-pay | Admitting: Family Medicine

## 2013-02-17 VITALS — BP 111/74 | HR 82 | Temp 98.0°F | Wt 169.0 lb

## 2013-02-17 DIAGNOSIS — L989 Disorder of the skin and subcutaneous tissue, unspecified: Secondary | ICD-10-CM

## 2013-02-17 DIAGNOSIS — E119 Type 2 diabetes mellitus without complications: Secondary | ICD-10-CM

## 2013-02-17 DIAGNOSIS — J309 Allergic rhinitis, unspecified: Secondary | ICD-10-CM

## 2013-02-17 DIAGNOSIS — R51 Headache: Secondary | ICD-10-CM

## 2013-02-17 DIAGNOSIS — M069 Rheumatoid arthritis, unspecified: Secondary | ICD-10-CM

## 2013-02-17 MED ORDER — LEVOCETIRIZINE DIHYDROCHLORIDE 5 MG PO TABS: 5.0000 mg | ORAL_TABLET | Freq: Every evening | ORAL | Status: DC

## 2013-02-17 MED ORDER — OXYCODONE-ACETAMINOPHEN 10-325 MG PO TABS: 1.0000 | ORAL_TABLET | Freq: Four times a day (QID) | ORAL | Status: DC | PRN

## 2013-02-17 MED ORDER — BACITRACIN 500 UNIT/GM EX OINT: 1.0000 "application " | TOPICAL_OINTMENT | Freq: Two times a day (BID) | CUTANEOUS | Status: DC

## 2013-02-17 MED ORDER — MOMETASONE FUROATE 50 MCG/ACT NA SUSP: 2.0000 | Freq: Every day | NASAL | Status: DC

## 2013-02-17 MED ORDER — GLUCOSE BLOOD VI STRP: ORAL_STRIP | Status: DC

## 2013-02-17 NOTE — Assessment & Plan Note (Signed)
-   See headache ?

## 2013-02-17 NOTE — Patient Instructions (Signed)
Nice to see you today. We will try a new nasal spray and anti-allergy medication. Hopefully this will help with your headaches. I will see you back in November to check on your diabetes, blood pressure, and pain management.

## 2013-02-17 NOTE — Assessment & Plan Note (Addendum)
Continue to have headache. Imaging has been negative for a cause. Given long history of sinus issues, this may be related to her sinus medications note working as well as previously. Other potential causes could be medication overuse headache (given chronic percocet use) or tension headache. Will start with changing to nasonex and xyzal to see if this helps sinuses and headache. May need to discuss cutting back on pain medication in the future.

## 2013-02-17 NOTE — Progress Notes (Signed)
  Subjective:    Patient ID: Michelle [name], female    DOB: Oct 16, 1950, 62 y.o.   MRN: [id number]  HPI Patient is 62 yo female who presents in follow-up.  HA: continues to have HA, mostly frontal now. Complains of sinus congestion constantly. Uses zyrtec and flonase daily. Does not think these work that well now. Additionally takes percocet daily for pain. Previously had posterior headache. Imaging has been negative with no indication of a cause for the HA. Patient was concerned that it could be chiari malformation given she has 2 daughters with this.  DIABETES Disease Monitoring: Blood Sugar ranges-am 100, pm 130-140.     Visual problems- minimally blurry, had retinal scan with mild diabetic retinopathy Medications: metformin Compliance- taking   Left leg lesion: injured her self about 4 weeks ago. Has been slow to heal. Has not done anything for this lesion. Has not been draining anything. Is mildly erythematous around the lesion.  Review of Systems see HPI     Objective:   Physical Exam  Constitutional: She appears well-developed and well-nourished.  HENT:  Head: Normocephalic and atraumatic.  Nose: Nose normal.  Eyes: Conjunctivae are normal. Right eye exhibits no discharge. Left eye exhibits no discharge.  Cardiovascular: Normal rate, regular rhythm and normal heart sounds.   Pulmonary/Chest: Effort normal and breath sounds normal.  Skin:  Left lower leg: mid pretibial portion with small excoriation with mild surrounding erythema, no induration or fluctuance  BP 111/74  Pulse 82  Temp(Src) 98 F (36.7 C) (Oral)  Wt 169 lb (76.658 kg)  BMI 29.94 kg/m2     Assessment & Plan:

## 2013-02-17 NOTE — Assessment & Plan Note (Signed)
Slow to heal. Will try bacitracin.

## 2013-02-17 NOTE — Assessment & Plan Note (Signed)
Stable  Continue current regimen  

## 2013-03-15 ENCOUNTER — Telehealth: Payer: Self-pay | Admitting: Family Medicine

## 2013-03-15 ENCOUNTER — Other Ambulatory Visit: Payer: Self-pay | Admitting: Family Medicine

## 2013-03-15 MED ORDER — ROSUVASTATIN CALCIUM 40 MG PO TABS: 40.0000 mg | ORAL_TABLET | Freq: Every day | ORAL | Status: DC

## 2013-03-15 NOTE — Telephone Encounter (Signed)
Patient calls stating that the new cholesterol medicine that she was put on makes her very constipated and states she feel that the medicine is "affecting her memory". States she cannot go anywhere without getting lost. Feels that this is from cholesterol meds. Would like something else or Crestor again.

## 2013-03-15 NOTE — Telephone Encounter (Signed)
I changed the patient back to crestor. She need to come in to be seen for this issue as it may not be related to her lipitor and may be related to something else. Please advise the patient.

## 2013-03-15 NOTE — Telephone Encounter (Signed)
I have refilled this medication for the patient. If she is using this more than a couple of times per week she needs to come in for an appointment to discuss her asthma.

## 2013-03-15 NOTE — Telephone Encounter (Signed)
LM with husband to return call. Fahed Morten, [name]

## 2013-03-19 ENCOUNTER — Ambulatory Visit: Payer: Medicaid Other | Admitting: Family Medicine

## 2013-03-22 ENCOUNTER — Ambulatory Visit: Payer: Medicaid Other | Admitting: Family Medicine

## 2013-03-23 ENCOUNTER — Ambulatory Visit (INDEPENDENT_AMBULATORY_CARE_PROVIDER_SITE_OTHER): Payer: Medicaid Other | Admitting: Family Medicine

## 2013-03-23 ENCOUNTER — Encounter: Payer: Self-pay | Admitting: Family Medicine

## 2013-03-23 VITALS — BP 124/68 | HR 84 | Temp 98.3°F | Ht 63.0 in | Wt 173.0 lb

## 2013-03-23 DIAGNOSIS — E785 Hyperlipidemia, unspecified: Secondary | ICD-10-CM

## 2013-03-23 DIAGNOSIS — R413 Other amnesia: Secondary | ICD-10-CM

## 2013-03-23 MED ORDER — ROSUVASTATIN CALCIUM 10 MG PO TABS: 10.0000 mg | ORAL_TABLET | Freq: Every day | ORAL | Status: DC
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
## 2013-03-23 NOTE — Patient Instructions (Signed)
Nice to see you again. I have sent in for crestor 10 mg tabs. Per the test I did today, you do not have dementia. If you continue to have issues with getting lost or confused please let me know.

## 2013-03-24 DIAGNOSIS — R413 Other amnesia: Secondary | ICD-10-CM | POA: Insufficient documentation

## 2013-03-24 NOTE — Assessment & Plan Note (Signed)
One episode of ending up at doctors office across the street from where she was trying to go. MMSE 30/30. Will continue to monitor.

## 2013-03-24 NOTE — Assessment & Plan Note (Signed)
Placed back on crestor 10 mg.

## 2013-04-08 ENCOUNTER — Other Ambulatory Visit: Payer: Self-pay | Admitting: Family Medicine

## 2013-04-12 ENCOUNTER — Telehealth: Payer: Self-pay | Admitting: Family Medicine

## 2013-04-12 NOTE — Telephone Encounter (Signed)
Pt would like a nurse to call her, because she pulled a muscle in her groin. She wanted to know how she can relieve the pain. JW

## 2013-04-13 NOTE — Telephone Encounter (Signed)
Pt is called and she states she used a heating pad yesterday and it is feeling much better today.

## 2013-05-06 ENCOUNTER — Other Ambulatory Visit: Payer: Self-pay | Admitting: Family Medicine

## 2013-05-10 ENCOUNTER — Telehealth: Payer: Self-pay | Admitting: *Deleted

## 2013-05-10 NOTE — Telephone Encounter (Signed)
Received prior authorization request form from pharmacy for Losartan. Med not covered. Medicaid med list and PA form placed in MD box for completion. Gaylene Brooks, RN

## 2013-05-12 ENCOUNTER — Encounter: Payer: Self-pay | Admitting: Family Medicine

## 2013-05-12 ENCOUNTER — Ambulatory Visit (INDEPENDENT_AMBULATORY_CARE_PROVIDER_SITE_OTHER): Payer: Medicaid Other | Admitting: Family Medicine

## 2013-05-12 ENCOUNTER — Telehealth: Payer: Self-pay | Admitting: Family Medicine

## 2013-05-12 ENCOUNTER — Ambulatory Visit (HOSPITAL_COMMUNITY)
Admission: RE | Admit: 2013-05-12 | Discharge: 2013-05-12 | Disposition: A | Discharge status: Home / Self Care | Payer: Medicaid Other | Source: Ambulatory Visit | Attending: Family Medicine | Admitting: Family Medicine

## 2013-05-12 VITALS — BP 137/80 | HR 75 | Temp 98.8°F | Ht 63.0 in | Wt 172.0 lb

## 2013-05-12 DIAGNOSIS — W19XXXA Unspecified fall, initial encounter: Secondary | ICD-10-CM | POA: Insufficient documentation

## 2013-05-12 DIAGNOSIS — M25532 Pain in left wrist: Secondary | ICD-10-CM

## 2013-05-12 DIAGNOSIS — M069 Rheumatoid arthritis, unspecified: Secondary | ICD-10-CM

## 2013-05-12 DIAGNOSIS — R51 Headache: Secondary | ICD-10-CM

## 2013-05-12 DIAGNOSIS — M25539 Pain in unspecified wrist: Secondary | ICD-10-CM | POA: Insufficient documentation

## 2013-05-12 DIAGNOSIS — F411 Generalized anxiety disorder: Secondary | ICD-10-CM

## 2013-05-12 DIAGNOSIS — G894 Chronic pain syndrome: Secondary | ICD-10-CM

## 2013-05-12 DIAGNOSIS — G252 Other specified forms of tremor: Secondary | ICD-10-CM

## 2013-05-12 DIAGNOSIS — E119 Type 2 diabetes mellitus without complications: Secondary | ICD-10-CM

## 2013-05-12 DIAGNOSIS — R259 Unspecified abnormal involuntary movements: Secondary | ICD-10-CM

## 2013-05-12 DIAGNOSIS — M79609 Pain in unspecified limb: Secondary | ICD-10-CM | POA: Insufficient documentation

## 2013-05-12 MED ORDER — OXYCODONE-ACETAMINOPHEN 10-325 MG PO TABS: 1.0000 | ORAL_TABLET | Freq: Four times a day (QID) | ORAL | Status: DC | PRN

## 2013-05-12 MED ORDER — CLONAZEPAM 1 MG PO TABS: 1.0000 mg | ORAL_TABLET | Freq: Two times a day (BID) | ORAL | Status: DC | PRN

## 2013-05-12 MED ORDER — PROPRANOLOL HCL 40 MG PO TABS: 40.0000 mg | ORAL_TABLET | Freq: Two times a day (BID) | ORAL | Status: DC

## 2013-05-12 MED ORDER — METFORMIN HCL 1000 MG PO TABS: ORAL_TABLET | ORAL | Status: DC

## 2013-05-12 NOTE — Patient Instructions (Signed)
Nice to see you. Please go to the radiology department to get an X-ray of your wrist. Please use the propranolol for your tremor.  We will call with the results of your labs. Increase your metformin back to 1000 mg twice a day. I will see you back in the next 1-2 weeks to discuss this.

## 2013-05-12 NOTE — Telephone Encounter (Signed)
Pt called and would like someone to call her to go over her xray's that were done today. JW

## 2013-05-12 NOTE — Telephone Encounter (Signed)
Consulted with MD.  Pt informed nothing was broken and she should rest, ice and use nsaids for her wrist.  Pt agreeable. Michelle [name], [name]

## 2013-05-13 ENCOUNTER — Other Ambulatory Visit: Payer: Self-pay | Admitting: Family Medicine

## 2013-05-13 ENCOUNTER — Telehealth: Payer: Self-pay | Admitting: Family Medicine

## 2013-05-13 NOTE — Telephone Encounter (Signed)
Pt called because she is still waiting to see when she will get her losartan and nexium jw

## 2013-05-13 NOTE — Telephone Encounter (Signed)
Only received prior authorization request from pharmacy for losartan.  Prior authorization form placed in MD box on 05/12/13--see previous phone note.  Will complete in [place] [employer] after MD completes form.  Gaylene Brooks, RN

## 2013-05-13 NOTE — Telephone Encounter (Signed)
Have we heard back from PA for these medications?  Jazmin Hartsell,CMA

## 2013-05-14 MED ORDER — LOSARTAN POTASSIUM 50 MG PO TABS: 50.0000 mg | ORAL_TABLET | Freq: Every day | ORAL | Status: DC

## 2013-05-14 NOTE — Assessment & Plan Note (Addendum)
Patient with tremor most consistent with benign essential tremor. Does not appear to be related to a parkinsons type tremor as there is not gait disturbance, rigidity, or masked facies. Will give trial of propranolol to treat this. TSH order to evaluate for hyperthyroid state contributing to this.

## 2013-05-14 NOTE — Assessment & Plan Note (Signed)
Patient with continued headache. Most likely related to medication overuse as patient has had MRI that revealed no cause in addition to it being uncommon to have tension headache or migraine for several months straight. Discussed that this is likely related to her use of the percocet and that it may be beneficial to decrease her dosage and add amitriptyline for help with her pain. She declined doing this stating that she would rather deal with the headache than deal with her pain.

## 2013-05-14 NOTE — Assessment & Plan Note (Signed)
Refilled percocet

## 2013-05-14 NOTE — Assessment & Plan Note (Signed)
Refilled patients percocet. Discussed that medication overuse may be the causing factor of her headache and talked about decreasing her dosage and starting on amitriptyline for her pain. Patient refused a change in her pain medications at this time stating "I'd rather deal with the headache than with my pain."

## 2013-05-14 NOTE — Telephone Encounter (Signed)
It does not appear the patient has been tried on an ACE inhibitor and this is the preferred drug. I attempted to contact the patient to discuss this, though she was not home. I left a message asking her to call back to discuss this. Will determine course of action once she calls back.

## 2013-05-14 NOTE — Progress Notes (Signed)
Patient ID: Michelle [name], female   DOB: 08/01/1950, 62 y.o.   MRN: [id number] Michelle [name] is a 62 y.o. female who presents today for evaluation of wrist following a fall this morning, headaches, tremor and diabetes.  Wrist pain: states tripped over a cord this morning and caught herself with her left wrist. Noted swelling immediately, though no bruising. Notes this area is sore to touch. No issues with this wrist previously.  Headaches: patient continues to have daily frontal headaches. Now she hears a swishing noise when she sits up intermittently for the past month. She denies light headedness. She has tried tylenol and aleve with intermittent benefit. She states her percocet help. Note she had an MRI recently that did not reveal a cause for this headache. She does not chronic sinusitis for which she uses nasonex.  Tremor: in bilateral hands L>R. Has had this for a long time. Notes sister has this as well. Nothing makes this better. She does not drink alcohol.  DIABETES Disease Monitoring: Blood Sugar ranges-states usually in the low 100's Polyuria/phagia/dipsia- no      Medications: Compliance- taking Hypoglycemic symptoms- none  No stomach upset.   Past Medical History  Diagnosis Date  . RA (rheumatoid arthritis)     CCP positive [facility] by [facility]  . HLD (hyperlipidemia)   . Diabetes mellitus   . Asthma   . Allergy   . Anxiety   . Cataract     beginning on left eye  . GERD (gastroesophageal reflux disease)   . Osteoporosis     History  Smoking status  . Former Smoker -- 1.50 packs/day for 5 years  . Types: Cigarettes  . Quit date: 05/31/1994  Smokeless tobacco  . Never Used    Family History  Problem Relation Age of Onset  . Stroke Father 89    died  . Lung cancer Brother 49    died  . Rheum arthritis Sister 66  . Cancer Sister     breast  . Cancer Daughter     colon  . Colon cancer Maternal Aunt   . Esophageal cancer Neg Hx   . Rectal cancer Neg Hx   .  Stomach cancer Neg Hx   . Cancer Mother     uterine  . Cancer Sister     colon    Current Outpatient Prescriptions on File Prior to Visit  Medication Sig Dispense Refill  . adalimumab (HUMIRA) 40 MG/0.8ML injection Inject 40 mg into the skin every 14 (fourteen) days.      Marland Kitchen aspirin 81 MG chewable tablet Chew 81 mg by mouth daily.       . bacitracin 500 UNIT/GM ointment Apply 1 application topically 2 (two) times daily.  15 g  0  . beclomethasone (QVAR) 40 MCG/ACT inhaler Inhale 2 puffs into the lungs 2 (two) times daily.  1 Inhaler  12  . Blood Glucose Monitoring Suppl (ACCU-CHEK AVIVA PLUS) W/DEVICE KIT 1 Device by Does not apply route daily.  1 kit  0  . citalopram (CELEXA) 40 MG tablet TAKE 1 TABLET DAILY  30 tablet  6  . citalopram (CELEXA) 40 MG tablet Take 1 tablet (40 mg total) by mouth daily.  30 tablet  11  . clonazePAM (KLONOPIN) 1 MG tablet Take 1 tablet (1 mg total) by mouth 3 (three) times daily as needed for anxiety. Do not fill until 60 days after date on script.  90 tablet  0  . cyclobenzaprine (FLEXERIL) 5  MG tablet Take 1 tablet (5 mg total) by mouth 3 (three) times daily as needed for muscle spasms.  30 tablet  1  . diclofenac sodium (VOLTAREN) 1 % GEL Apply 2 g topically 4 (four) times daily as needed (for pain).      . folic acid (FOLVITE) 1 MG tablet Take 1 mg by mouth daily.      Marland Kitchen gabapentin (NEURONTIN) 300 MG capsule TAKE 1 CAPSULE (300 MG TOTAL) BY MOUTH AT BEDTIME.  30 capsule  3  . glucose blood (ACCU-CHEK AVIVA PLUS) test strip Use as instructed  100 each  2  . ibuprofen (ADVIL,MOTRIN) 600 MG tablet Take 1 tablet (600 mg total) by mouth every 8 (eight) hours as needed for pain.  30 tablet  0  . Lancets MISC 1 Device by Does not apply route daily.  200 each  1  . levocetirizine (XYZAL) 5 MG tablet TAKE 1 TABLET (5 MG TOTAL) BY MOUTH EVERY EVENING.  30 tablet  2  . meclizine (ANTIVERT) 50 MG tablet Take 1 tablet (50 mg total) by mouth 3 (three) times daily as  needed for dizziness.  30 tablet  0  . methotrexate (RHEUMATREX) 2.5 MG tablet Take 15 mg by mouth once a week. Saturdays. 6 tabs once a week      . NASONEX 50 MCG/ACT nasal spray PLACE 2 SPRAYS INTO THE NOSE DAILY.  17 g  2  . nitroGLYCERIN (NITROSTAT) 0.4 MG SL tablet Place 0.4 mg under the tongue every 5 (five) minutes as needed.        . ONE TOUCH LANCETS MISC Check blood sugar twice daily  200 each  PRN  . oxyCODONE-acetaminophen (PERCOCET) 10-325 MG per tablet Take 1 tablet by mouth every 6 (six) hours as needed for pain. Do not fill until 30 days from date it was printed.  120 tablet  0  . PROAIR HFA 108 (90 BASE) MCG/ACT inhaler INHALE 2 PUFFS INTO THE LUNGS EVERY 4 (FOUR) HOURS AS NEEDED FOR WHEEZING.  17 each  0  . promethazine (PHENERGAN) 12.5 MG tablet Take 1 tablet (12.5 mg total) by mouth every 8 (eight) hours as needed for nausea.  20 tablet  0  . rosuvastatin (CRESTOR) 10 MG tablet Take 1 tablet (10 mg total) by mouth daily.  30 tablet  3  . traZODone (DESYREL) 50 MG tablet Take 50 mg by mouth at bedtime as needed for sleep.       Marland Kitchen triamcinolone cream (KENALOG) 0.1 % Apply topically 2 (two) times daily.  85 g  1   No current facility-administered medications on file prior to visit.    ROS: Per HPI   Physical Exam Filed Vitals:   05/12/13 0948  BP: 137/80  Pulse: 75  Temp: 98.8 F (37.1 C)    Physical Examination: General appearance - alert, well appearing, and in no distress Eyes - pupils equal and reactive, extraocular eye movements intact Ears - bilateral TM's and external ear canals normal Mouth - mucous membranes moist, pharynx normal without lesions Neck - supple, no significant adenopathy, carotids upstroke normal bilaterally, no bruits Chest - clear to auscultation, no wheezes, rales or rhonchi, symmetric air entry Heart - normal rate, regular rhythm, normal S1, S2, no murmurs, rubs, clicks or gallops Musculoskeletal - left wrist and distal forearm with mild  swelling and tenderness to palpation over the dorsolateral aspect, no apparent bruising noted, good range of motion at the wrist Neuro - CN 2-12 intact, 5/5  strength in deltoids, biceps, triceps, grip, hip flexors, quads, hamstrings, plantarflexion, and dorsiflexion, sensation to light touch intact throughout bilateral upper and lower extremities, patellar reflexes absent bilaterally, fine tremor noted in bilateral hands at rest and with intention, no cogwheel rigidity, no shuffling gait  Assessment/Plan: Please see individual problem list.  I have spent >25 minutes in the care of this patient with >50% spent in counseling/coordination of care regarding wrist pain, headaches, tremor, and diabetes.

## 2013-05-14 NOTE — Assessment & Plan Note (Signed)
Patients A1c slightly up from previously following a decrease in her metformin dosage. Will increase back to 1000 mg BID. F/u in 3 months.

## 2013-05-14 NOTE — Assessment & Plan Note (Signed)
Imaging ordered and reviewed revealing ne evidence of fracture. Most likely represents a wrist sprain. She was given a wrist brace to wear and advised to ice it and use ibuprofen for pain.

## 2013-05-17 NOTE — Telephone Encounter (Signed)
The below was entered in error. See note from 05/13/13. Lonnie Reth, [name]

## 2013-05-17 NOTE — Telephone Encounter (Signed)
Patients TSH was normal making hyperthyroidism an unlikely cause of her tremor. She should continue the propranolol for this tremor.  The above is from MD. Attempted to call but no answer and no machine. [place], [name]

## 2013-05-25 ENCOUNTER — Telehealth: Payer: Self-pay | Admitting: *Deleted

## 2013-05-25 NOTE — Telephone Encounter (Signed)
[name] calling to request NPI # to authorize 3 visits.  Patient has an appt with Dr. Janee Morn on 06/03/13 for right hand follow-up.  NPI # given.  Gaylene Brooks, RN

## 2013-05-26 ENCOUNTER — Encounter: Payer: Self-pay | Admitting: Family Medicine

## 2013-05-26 ENCOUNTER — Ambulatory Visit (INDEPENDENT_AMBULATORY_CARE_PROVIDER_SITE_OTHER): Payer: Medicaid Other | Admitting: Family Medicine

## 2013-05-26 VITALS — BP 139/87 | HR 67 | Ht 63.0 in | Wt 175.0 lb

## 2013-05-26 DIAGNOSIS — R259 Unspecified abnormal involuntary movements: Secondary | ICD-10-CM

## 2013-05-26 DIAGNOSIS — J329 Chronic sinusitis, unspecified: Secondary | ICD-10-CM | POA: Insufficient documentation

## 2013-05-26 DIAGNOSIS — G252 Other specified forms of tremor: Secondary | ICD-10-CM

## 2013-05-26 MED ORDER — DOXYCYCLINE HYCLATE 100 MG PO TABS: 100.0000 mg | ORAL_TABLET | Freq: Two times a day (BID) | ORAL | Status: DC

## 2013-05-26 NOTE — Patient Instructions (Signed)
Nice to see you again. We will get you referred to ENT for evaluation of your chronic sinus issues. I will get your losartan prior authorization done today.

## 2013-05-26 NOTE — Progress Notes (Signed)
Patient ID: Michelle [name], female   DOB: [birth year], 62 y.o.   MRN: [id number]  Marikay Alar, MD Phone: [phone]  Michelle [name] is a 62 y.o. female who presents today for f/u of chronic sinusitis and tremor.  Chronic sinusitis: patient request ENT referral. She has had this issue for many years. She continues to have frontal sinus headache despite use of intranasal steroid spray and oral antihistamine. She notes post nasal drip, occasional bloody nose, blisters and sores in her nose. She notes multiple courses of antibiotics for this that very beneficial while she was on them, though symptoms returned once she finished the course. Note the patient is on Humira for her RA. Patient had MRI of her brain recently and there was no comment of sinus mass of issue.  Tremor: has improved since starting propranolol. States she has tolerated this medication well.   Past Medical History  Diagnosis Date  . RA (rheumatoid arthritis)     CCP positive followed by [facility]  . HLD (hyperlipidemia)   . Diabetes mellitus   . Asthma   . Allergy   . Anxiety   . Cataract     beginning on left eye  . GERD (gastroesophageal reflux disease)   . Osteoporosis     History  Smoking status  . Former Smoker -- 1.50 packs/day for 5 years  . Types: Cigarettes  . Quit date: 05/31/1994  Smokeless tobacco  . Never Used    Family History  Problem Relation Age of Onset  . Stroke Father 44    died  . Lung cancer Brother 49    died  . Rheum arthritis Sister 5  . Cancer Sister     breast  . Cancer Daughter     colon  . Colon cancer Maternal Aunt   . Esophageal cancer Neg Hx   . Rectal cancer Neg Hx   . Stomach cancer Neg Hx   . Cancer Mother     uterine  . Cancer Sister     colon    Current Outpatient Prescriptions on File Prior to Visit  Medication Sig Dispense Refill  . adalimumab (HUMIRA) 40 MG/0.8ML injection Inject 40 mg into the skin every 14 (fourteen) days.      Marland Kitchen aspirin 81 MG  chewable tablet Chew 81 mg by mouth daily.       . bacitracin 500 UNIT/GM ointment Apply 1 application topically 2 (two) times daily.  15 g  0  . beclomethasone (QVAR) 40 MCG/ACT inhaler Inhale 2 puffs into the lungs 2 (two) times daily.  1 Inhaler  12  . Blood Glucose Monitoring Suppl (ACCU-CHEK AVIVA PLUS) W/DEVICE KIT 1 Device by Does not apply route daily.  1 kit  0  . citalopram (CELEXA) 40 MG tablet TAKE 1 TABLET DAILY  30 tablet  6  . citalopram (CELEXA) 40 MG tablet Take 1 tablet (40 mg total) by mouth daily.  30 tablet  11  . clonazePAM (KLONOPIN) 1 MG tablet Take 1 tablet (1 mg total) by mouth 3 (three) times daily as needed for anxiety. Do not fill until 60 days after date on script.  90 tablet  0  . clonazePAM (KLONOPIN) 1 MG tablet Take 1 tablet (1 mg total) by mouth 2 (two) times daily as needed for anxiety. Do not fill until 60 days after date printed.  60 tablet  0  . clonazePAM (KLONOPIN) 1 MG tablet Take 1 tablet (1 mg total) by mouth 2 (two) times  daily as needed for anxiety. Do not fill until 30 days after date printed.  60 tablet  0  . clonazePAM (KLONOPIN) 1 MG tablet Take 1 tablet (1 mg total) by mouth 2 (two) times daily as needed for anxiety.  60 tablet  0  . cyclobenzaprine (FLEXERIL) 5 MG tablet Take 1 tablet (5 mg total) by mouth 3 (three) times daily as needed for muscle spasms.  30 tablet  1  . diclofenac sodium (VOLTAREN) 1 % GEL Apply 2 g topically 4 (four) times daily as needed (for pain).      . folic acid (FOLVITE) 1 MG tablet Take 1 mg by mouth daily.      Marland Kitchen gabapentin (NEURONTIN) 300 MG capsule TAKE 1 CAPSULE (300 MG TOTAL) BY MOUTH AT BEDTIME.  30 capsule  3  . glucose blood (ACCU-CHEK AVIVA PLUS) test strip Use as instructed  100 each  2  . ibuprofen (ADVIL,MOTRIN) 600 MG tablet Take 1 tablet (600 mg total) by mouth every 8 (eight) hours as needed for pain.  30 tablet  0  . Lancets MISC 1 Device by Does not apply route daily.  200 each  1  . levocetirizine  (XYZAL) 5 MG tablet TAKE 1 TABLET (5 MG TOTAL) BY MOUTH EVERY EVENING.  30 tablet  2  . losartan (COZAAR) 50 MG tablet Take 1 tablet (50 mg total) by mouth daily.  90 tablet  1  . meclizine (ANTIVERT) 50 MG tablet Take 1 tablet (50 mg total) by mouth 3 (three) times daily as needed for dizziness.  30 tablet  0  . metFORMIN (GLUCOPHAGE) 1000 MG tablet TAKE 1 TABLET (1,000 MG TOTAL) BY MOUTH 2 (TWO) TIMES DAILY WITH A MEAL.  60 tablet  3  . methotrexate (RHEUMATREX) 2.5 MG tablet Take 15 mg by mouth once a week. Saturdays. 6 tabs once a week      . NASONEX 50 MCG/ACT nasal spray PLACE 2 SPRAYS INTO THE NOSE DAILY.  17 g  2  . NEXIUM 40 MG capsule TAKE 1 CAPSULE (40 MG TOTAL) BY MOUTH DAILY.  30 capsule  3  . nitroGLYCERIN (NITROSTAT) 0.4 MG SL tablet Place 0.4 mg under the tongue every 5 (five) minutes as needed.        . ONE TOUCH LANCETS MISC Check blood sugar twice daily  200 each  PRN  . oxyCODONE-acetaminophen (PERCOCET) 10-325 MG per tablet Take 1 tablet by mouth every 6 (six) hours as needed for pain. Do not fill until 30 days from date it was printed.  120 tablet  0  . oxyCODONE-acetaminophen (PERCOCET) 10-325 MG per tablet Take 1 tablet by mouth every 6 (six) hours as needed for pain. Do not fill until 60 days after date printed.  120 tablet  0  . oxyCODONE-acetaminophen (PERCOCET) 10-325 MG per tablet Take 1 tablet by mouth every 6 (six) hours as needed for pain. Do not fill until 90 days after date printed  120 tablet  0  . oxyCODONE-acetaminophen (PERCOCET) 10-325 MG per tablet Take 1 tablet by mouth every 6 (six) hours as needed for pain. Do not fill until 30 days after date printed.  120 tablet  0  . PROAIR HFA 108 (90 BASE) MCG/ACT inhaler INHALE 2 PUFFS INTO THE LUNGS EVERY 4 (FOUR) HOURS AS NEEDED FOR WHEEZING.  17 each  0  . promethazine (PHENERGAN) 12.5 MG tablet Take 1 tablet (12.5 mg total) by mouth every 8 (eight) hours as needed for nausea.  20 tablet  0  . propranolol (INDERAL)  40 MG tablet Take 1 tablet (40 mg total) by mouth 2 (two) times daily.  60 tablet  0  . rosuvastatin (CRESTOR) 10 MG tablet Take 1 tablet (10 mg total) by mouth daily.  30 tablet  3  . traZODone (DESYREL) 50 MG tablet Take 50 mg by mouth at bedtime as needed for sleep.       Marland Kitchen triamcinolone cream (KENALOG) 0.1 % Apply topically 2 (two) times daily.  85 g  1   No current facility-administered medications on file prior to visit.    ROS: Per HPI   Physical Exam Filed Vitals:   05/26/13 0926  BP: 139/87  Pulse: 67    Physical Examination: General appearance - alert, well appearing, and in no distress Eyes - pupils equal and reactive, extraocular eye movements intact Ears - bilateral TM's and external ear canals normal Nose - mucosal erythema and sinus tenderness noted frontal and maxillary Mouth - mucous membranes moist, pharynx normal without lesions Chest - clear to auscultation, no wheezes, rales or rhonchi, symmetric air entry Heart - normal rate, regular rhythm, normal S1, S2, no murmurs, rubs, clicks or gallops   Assessment/Plan: Please see individual problem list.

## 2013-05-26 NOTE — Assessment & Plan Note (Signed)
Patient presents requesting referral to ENT for evaluation of her chronic sinusitis. Patient has not had improvement on intranasal steroids and oral anti-histamine. She has had multiple courses of antibiotics for this. She continues to have frontal sinus pressure and headache. Patient is also on humira which will predispose her for sinus infection. Referral order placed for ENT. Will place on doxycycline 100 mg BID for 5 days for sinusitis.

## 2013-05-26 NOTE — Assessment & Plan Note (Signed)
Improved on propranolol. Will continue this medication.

## 2013-05-31 ENCOUNTER — Telehealth: Payer: Self-pay | Admitting: Family Medicine

## 2013-05-31 DIAGNOSIS — Z1211 Encounter for screening for malignant neoplasm of colon: Secondary | ICD-10-CM

## 2013-05-31 NOTE — Telephone Encounter (Signed)
Pt is requesting a referral to schedule a colonoscopy. jw

## 2013-06-02 ENCOUNTER — Telehealth: Payer: Self-pay | Admitting: Family Medicine

## 2013-06-02 NOTE — Telephone Encounter (Signed)
Called to discuss losartan. Patient has taken an ACEI in the past and had cough with this. Prior auth filled out and returned to [place].

## 2013-06-02 NOTE — Telephone Encounter (Signed)
Referral to GI for colonoscopy made. 

## 2013-06-02 NOTE — Telephone Encounter (Signed)
Completed PA info in [place] Tracks.  Status pending.  Will recheck status in 24 hours.  Richardson, Jeannette Ann, RN  

## 2013-06-02 NOTE — Telephone Encounter (Signed)
See phone note for 06/02/13.   Gaylene Brooks, RN

## 2013-06-03 ENCOUNTER — Other Ambulatory Visit: Payer: Self-pay | Admitting: Family Medicine

## 2013-06-03 NOTE — Telephone Encounter (Signed)
Prior approval for losartan completed via [place] Tracks.  Med approved for 06/02/13 - 06/02/14  Prior approval # [id number].  [facility] pharmacy informed.  Gaylene Brooks, RN

## 2013-06-08 ENCOUNTER — Other Ambulatory Visit: Payer: Self-pay | Admitting: Family Medicine

## 2013-06-08 ENCOUNTER — Telehealth: Payer: Self-pay | Admitting: Family Medicine

## 2013-06-08 MED ORDER — GABAPENTIN 300 MG PO CAPS: ORAL_CAPSULE | ORAL | Status: DC

## 2013-06-08 NOTE — Telephone Encounter (Signed)
Refill request for Gabapentin. Also, patient would like to know status of ENT and GI referrals.

## 2013-06-08 NOTE — Telephone Encounter (Signed)
Gabapentin refilled

## 2013-06-08 NOTE — Telephone Encounter (Signed)
Pt is aware.  Chantea Surace,CMA  

## 2013-06-08 NOTE — Telephone Encounter (Signed)
To [employer] for Referral status  To Dr. Birdie Sons for med refill. Fleeger, [name]

## 2013-06-14 ENCOUNTER — Other Ambulatory Visit: Payer: Self-pay | Admitting: Family Medicine

## 2013-06-21 ENCOUNTER — Telehealth: Payer: Self-pay | Admitting: Family Medicine

## 2013-06-21 NOTE — Telephone Encounter (Signed)
Spoke with patient. She has only been taking one propanolol a day "up till about 2 weeks ago".  She then read the bottle and realized that she was supposed to be taking them BID at which time she did, this is when the low energy started(only "symptom" reported).  At her visit to another MD today she reports after 2 checks it was 88/58.  Advised to go back to one pill a day until she hears from [place], will forward to Dr. Birdie Sons. Fleeger, [name]

## 2013-06-21 NOTE — Telephone Encounter (Signed)
Patient was put on a second BP med and now her BP is running around 88/58. Is this something patient should be worried about? She is having no other symptoms but very low energy. Please call patient today.

## 2013-06-22 NOTE — Telephone Encounter (Signed)
Pt is aware.  States that she checked it this morning and it was fine.  She did not take another pill last night. Newton Frutiger,CMA

## 2013-06-22 NOTE — Telephone Encounter (Signed)
If her BP is becoming too low on 2 of the propranolol she can take one pill until we discuss this at her next follow-up appointment. If she feels she needs to be seen sooner than her next scheduled appointment she can schedule an appointment.

## 2013-07-04 ENCOUNTER — Other Ambulatory Visit: Payer: Self-pay | Admitting: Family Medicine

## 2013-07-06 ENCOUNTER — Other Ambulatory Visit: Payer: Self-pay | Admitting: Family Medicine

## 2013-07-06 ENCOUNTER — Telehealth: Payer: Self-pay | Admitting: Family Medicine

## 2013-07-06 DIAGNOSIS — Z1231 Encounter for screening mammogram for malignant neoplasm of breast: Secondary | ICD-10-CM

## 2013-07-06 MED ORDER — ALBUTEROL SULFATE HFA 108 (90 BASE) MCG/ACT IN AERS: INHALATION_SPRAY | RESPIRATORY_TRACT | Status: DC

## 2013-07-06 NOTE — Telephone Encounter (Signed)
Pt called because she is suppose to get two of the ProAir. Can we send another one in. jw

## 2013-07-06 NOTE — Telephone Encounter (Signed)
Please advise the patient that if she is using the proair more than 2-3 times a week she will need to come in for a follow-up to adjust her medications. One refill sent in.

## 2013-07-07 ENCOUNTER — Other Ambulatory Visit: Payer: Self-pay | Admitting: Family Medicine

## 2013-07-07 ENCOUNTER — Encounter: Payer: Self-pay | Admitting: Family Medicine

## 2013-07-07 ENCOUNTER — Ambulatory Visit (INDEPENDENT_AMBULATORY_CARE_PROVIDER_SITE_OTHER): Payer: Medicaid Other | Admitting: Family Medicine

## 2013-07-07 VITALS — BP 126/79 | HR 89 | Temp 98.2°F | Ht 64.0 in | Wt 173.4 lb

## 2013-07-07 DIAGNOSIS — J45909 Unspecified asthma, uncomplicated: Secondary | ICD-10-CM

## 2013-07-07 DIAGNOSIS — R05 Cough: Secondary | ICD-10-CM

## 2013-07-07 DIAGNOSIS — R059 Cough, unspecified: Secondary | ICD-10-CM

## 2013-07-07 MED ORDER — PREDNISONE 10 MG PO TABS: 10.0000 mg | ORAL_TABLET | Freq: Every day | ORAL | Status: DC

## 2013-07-07 MED ORDER — ALBUTEROL SULFATE HFA 108 (90 BASE) MCG/ACT IN AERS: INHALATION_SPRAY | RESPIRATORY_TRACT | Status: DC

## 2013-07-07 NOTE — Progress Notes (Signed)
Garret Reddish, MD Phone: [phone]  Subjective:  Chief complaint-noted  Cough Patient states cough started before she saw Dr. Caryl Bis in November and has been going on for nearly 2 months. Complains of some dry cough but some greenish mucus as well. The left side of her sternum hurts when she coughs.  She did spit up a small amount of blood 2 weeks ago but none other than that. At night she has some awakenings coughing and seems to have symptoms worse at night and in the morning. Does have some mild shortness of breath and chest tightness.Has a history of asthma and albuterol is helpful. Taking 2-3 x a day. States she did not do well with qvar (states just likes albuterol) so stopped this around 2 moths ago. She does have Rheumatoid Arthritis and takes humira and methotrexate.  ROS-no fever, chills, unintentional weight loss, night sweats. Outside of cough has <5 seconds tinges of left sided sharp chest pain that is nonexertional.  Past Medical History- chronic sinusitis (seen by ENT 3 weeks ago and diagnosed with deviated septum), Rheumatoid Arthritis, chest pain (followed by Dr. Terrence Dupont) , irritable bowel syndrome, restless legs, asthma, copd on list but patient denies-smoked 5 years quit > 20 years ago, GERD, DM II,   Medications- reviewed and updated Current Outpatient Prescriptions  Medication Sig Dispense Refill  . ACCU-CHEK AVIVA PLUS test strip USE AS DIRECTED  100 each  2  . adalimumab (HUMIRA) 40 MG/0.8ML injection Inject 40 mg into the skin every 14 (fourteen) days.      Marland Kitchen albuterol (PROAIR HFA) 108 (90 BASE) MCG/ACT inhaler INHALE 2 PUFFS EVERY 4 HOURS AS NEEDED FOR WHEEZING  1 Inhaler  2  . aspirin 81 MG chewable tablet Chew 81 mg by mouth daily.       . bacitracin 500 UNIT/GM ointment Apply 1 application topically 2 (two) times daily.  15 g  0  . beclomethasone (QVAR) 40 MCG/ACT inhaler Inhale 2 puffs into the lungs 2 (two) times daily.  1 Inhaler  12  . Blood Glucose  Monitoring Suppl (ACCU-CHEK AVIVA PLUS) W/DEVICE KIT 1 Device by Does not apply route daily.  1 kit  0  . citalopram (CELEXA) 40 MG tablet Take 1 tablet (40 mg total) by mouth daily.  30 tablet  11  . clonazePAM (KLONOPIN) 1 MG tablet Take 1 tablet (1 mg total) by mouth 3 (three) times daily as needed for anxiety. Do not fill until 60 days after date on script.  90 tablet  0  . clonazePAM (KLONOPIN) 1 MG tablet Take 1 tablet (1 mg total) by mouth 2 (two) times daily as needed for anxiety. Do not fill until 60 days after date printed.  60 tablet  0  . clonazePAM (KLONOPIN) 1 MG tablet Take 1 tablet (1 mg total) by mouth 2 (two) times daily as needed for anxiety. Do not fill until 30 days after date printed.  60 tablet  0  . clonazePAM (KLONOPIN) 1 MG tablet Take 1 tablet (1 mg total) by mouth 2 (two) times daily as needed for anxiety.  60 tablet  0  . cyclobenzaprine (FLEXERIL) 5 MG tablet Take 1 tablet (5 mg total) by mouth 3 (three) times daily as needed for muscle spasms.  30 tablet  1  . diclofenac sodium (VOLTAREN) 1 % GEL Apply 2 g topically 4 (four) times daily as needed (for pain).      . folic acid (FOLVITE) 1 MG tablet Take 1 mg by  mouth daily.      Marland Kitchen gabapentin (NEURONTIN) 300 MG capsule TAKE 1 CAPSULE (300 MG TOTAL) BY MOUTH AT BEDTIME.  30 capsule  3  . ibuprofen (ADVIL,MOTRIN) 600 MG tablet Take 1 tablet (600 mg total) by mouth every 8 (eight) hours as needed for pain.  30 tablet  0  . Lancets MISC 1 Device by Does not apply route daily.  200 each  1  . levocetirizine (XYZAL) 5 MG tablet TAKE 1 TABLET (5 MG TOTAL) BY MOUTH EVERY EVENING.  30 tablet  2  . losartan (COZAAR) 50 MG tablet Take 1 tablet (50 mg total) by mouth daily.  90 tablet  1  . meclizine (ANTIVERT) 50 MG tablet Take 1 tablet (50 mg total) by mouth 3 (three) times daily as needed for dizziness.  30 tablet  0  . metFORMIN (GLUCOPHAGE) 1000 MG tablet TAKE 1 TABLET (1,000 MG TOTAL) BY MOUTH 2 (TWO) TIMES DAILY WITH A MEAL.   60 tablet  3  . methotrexate (RHEUMATREX) 2.5 MG tablet Take 15 mg by mouth once a week. Saturdays. 6 tabs once a week      . NASONEX 50 MCG/ACT nasal spray PLACE 2 SPRAYS INTO THE NOSE DAILY.  17 g  2  . NEXIUM 40 MG capsule TAKE 1 CAPSULE (40 MG TOTAL) BY MOUTH DAILY.  30 capsule  3  . nitroGLYCERIN (NITROSTAT) 0.4 MG SL tablet Place 0.4 mg under the tongue every 5 (five) minutes as needed.        . ONE TOUCH LANCETS MISC Check blood sugar twice daily  200 each  PRN  . oxyCODONE-acetaminophen (PERCOCET) 10-325 MG per tablet Take 1 tablet by mouth every 6 (six) hours as needed for pain. Do not fill until 30 days from date it was printed.  120 tablet  0  . oxyCODONE-acetaminophen (PERCOCET) 10-325 MG per tablet Take 1 tablet by mouth every 6 (six) hours as needed for pain. Do not fill until 60 days after date printed.  120 tablet  0  . oxyCODONE-acetaminophen (PERCOCET) 10-325 MG per tablet Take 1 tablet by mouth every 6 (six) hours as needed for pain. Do not fill until 90 days after date printed  120 tablet  0  . oxyCODONE-acetaminophen (PERCOCET) 10-325 MG per tablet Take 1 tablet by mouth every 6 (six) hours as needed for pain. Do not fill until 30 days after date printed.  120 tablet  0  . predniSONE (DELTASONE) 10 MG tablet Take 1 tablet (10 mg total) by mouth daily with breakfast.  5 tablet  0  . promethazine (PHENERGAN) 12.5 MG tablet Take 1 tablet (12.5 mg total) by mouth every 8 (eight) hours as needed for nausea.  20 tablet  0  . propranolol (INDERAL) 40 MG tablet TAKE 1 TABLET (40 MG TOTAL) BY MOUTH 2 (TWO) TIMES DAILY.  60 tablet  0  . rosuvastatin (CRESTOR) 10 MG tablet Take 1 tablet (10 mg total) by mouth daily.  30 tablet  3  . traZODone (DESYREL) 50 MG tablet Take 50 mg by mouth at bedtime as needed for sleep.       Marland Kitchen triamcinolone cream (KENALOG) 0.1 % Apply topically 2 (two) times daily.  85 g  1   No current facility-administered medications for this visit.    Objective: BP  126/79  Pulse 89  Temp(Src) 98.2 F (36.8 C) (Oral)  Ht _0  (1.626 m)  Wt 173 lb 6.4 oz (78.654 kg)  BMI 29.75  kg/m2 Gen: NAD, intermittent cough CV: RRR no murmurs rubs or gallops Lungs: CTAB no crackles, wheeze, rhonchi. Initially thought ? Decreased breath sounds on RLL but with deep breaths, lungs equal.  Ext: no edema  Assessment/Plan:  ASTHMA, INTERMITTENT Chronic cough x 2 months since being off of qvar. Will restart qvar and give 5 day burst of 23m prednisone (patient asked for no more than this). Using albuterol 2-3x a day currently and told patient hopeful this will reduce albuterol use. She will go get a CXR if symptoms have not improved in 7-10 days (concern of walking pna or atypical process such as TB given on Humira) but suspect this is asthma or COPD and will respond to treatment.    Orders Placed This Encounter  Procedures  . DG Chest 2 View    Standing Status: Future     Number of Occurrences:      Standing Expiration Date: 09/05/2014    Order Specific Question:  Reason for Exam (SYMPTOM  OR DIAGNOSIS REQUIRED)    Answer:  cough x 2 months    Order Specific Question:  Preferred imaging location?    Answer:  [facility]   Meds ordered this encounter  Medications  . albuterol (PROAIR HFA) 108 (90 BASE) MCG/ACT inhaler    Sig: INHALE 2 PUFFS EVERY 4 HOURS AS NEEDED FOR WHEEZING    Dispense:  1 Inhaler    Refill:  2  . predniSONE (DELTASONE) 10 MG tablet    Sig: Take 1 tablet (10 mg total) by mouth daily with breakfast.    Dispense:  5 tablet    Refill:  0

## 2013-07-07 NOTE — Assessment & Plan Note (Signed)
Chronic cough x 2 months since being off of qvar. Will restart qvar and give 5 day burst of 10mg  prednisone (patient asked for no more than this). Using albuterol 2-3x a day currently and told patient hopeful this will reduce albuterol use. She will go get a CXR if symptoms have not improved in 7-10 days (concern of walking pna or atypical process such as TB given on Humira) but suspect this is asthma or COPD and will respond to treatment.

## 2013-07-07 NOTE — Patient Instructions (Addendum)
Thanks for coming today. Sorry about your longterm cough.   Please restart your Qvar.  Please get the chest x-ray.   Follow up with Dr. Caryl Bis in 7-10 days or sooner if symptoms worsen, Dr. Yong Channel

## 2013-07-07 NOTE — Telephone Encounter (Signed)
LM for pt to call back.  She has an appt today to see Dr. Yong Channel on crosscover. Jazmin Hartsell,CMA

## 2013-07-13 ENCOUNTER — Ambulatory Visit (HOSPITAL_COMMUNITY)
Admission: RE | Admit: 2013-07-13 | Discharge: 2013-07-13 | Disposition: A | Discharge status: Home / Self Care | Payer: Medicaid Other | Source: Ambulatory Visit | Attending: Family Medicine | Admitting: Family Medicine

## 2013-07-13 DIAGNOSIS — J449 Chronic obstructive pulmonary disease, unspecified: Secondary | ICD-10-CM | POA: Insufficient documentation

## 2013-07-13 DIAGNOSIS — J4489 Other specified chronic obstructive pulmonary disease: Secondary | ICD-10-CM | POA: Insufficient documentation

## 2013-07-13 DIAGNOSIS — R05 Cough: Secondary | ICD-10-CM

## 2013-07-13 DIAGNOSIS — R059 Cough, unspecified: Secondary | ICD-10-CM | POA: Insufficient documentation

## 2013-07-16 ENCOUNTER — Other Ambulatory Visit: Payer: Self-pay | Admitting: Family Medicine

## 2013-07-28 ENCOUNTER — Other Ambulatory Visit: Payer: Self-pay | Admitting: Family Medicine

## 2013-07-29 DIAGNOSIS — J40 Bronchitis, not specified as acute or chronic: Secondary | ICD-10-CM | POA: Insufficient documentation

## 2013-08-03 ENCOUNTER — Encounter (HOSPITAL_COMMUNITY): Payer: Self-pay | Admitting: Pharmacy Technician

## 2013-08-09 ENCOUNTER — Ambulatory Visit (HOSPITAL_COMMUNITY)
Admission: RE | Admit: 2013-08-09 | Discharge: 2013-08-09 | Disposition: A | Discharge status: Home / Self Care | Payer: Medicaid Other | Source: Ambulatory Visit | Attending: Family Medicine | Admitting: Family Medicine

## 2013-08-09 DIAGNOSIS — Z1231 Encounter for screening mammogram for malignant neoplasm of breast: Secondary | ICD-10-CM | POA: Insufficient documentation

## 2013-08-11 ENCOUNTER — Ambulatory Visit: Payer: Medicaid Other | Admitting: Family Medicine

## 2013-08-11 ENCOUNTER — Ambulatory Visit (INDEPENDENT_AMBULATORY_CARE_PROVIDER_SITE_OTHER): Payer: Medicaid Other | Admitting: Family Medicine

## 2013-08-11 ENCOUNTER — Encounter: Payer: Self-pay | Admitting: Family Medicine

## 2013-08-11 VITALS — BP 112/74 | HR 67 | Temp 98.0°F | Wt 173.0 lb

## 2013-08-11 DIAGNOSIS — M654 Radial styloid tenosynovitis [de Quervain]: Secondary | ICD-10-CM

## 2013-08-11 DIAGNOSIS — F329 Major depressive disorder, single episode, unspecified: Secondary | ICD-10-CM

## 2013-08-11 DIAGNOSIS — F411 Generalized anxiety disorder: Secondary | ICD-10-CM

## 2013-08-11 DIAGNOSIS — F341 Dysthymic disorder: Secondary | ICD-10-CM

## 2013-08-11 DIAGNOSIS — M069 Rheumatoid arthritis, unspecified: Secondary | ICD-10-CM

## 2013-08-11 DIAGNOSIS — F419 Anxiety disorder, unspecified: Secondary | ICD-10-CM

## 2013-08-11 DIAGNOSIS — M0520 Rheumatoid vasculitis with rheumatoid arthritis of unspecified site: Secondary | ICD-10-CM

## 2013-08-11 DIAGNOSIS — F32A Depression, unspecified: Secondary | ICD-10-CM

## 2013-08-11 DIAGNOSIS — J45909 Unspecified asthma, uncomplicated: Secondary | ICD-10-CM

## 2013-08-11 DIAGNOSIS — I7789 Other specified disorders of arteries and arterioles: Secondary | ICD-10-CM

## 2013-08-11 DIAGNOSIS — E119 Type 2 diabetes mellitus without complications: Secondary | ICD-10-CM

## 2013-08-11 LAB — COMPREHENSIVE METABOLIC PANEL
ALBUMIN: 4.4 g/dL (ref 3.5–5.2)
ALK PHOS: 78 U/L (ref 39–117)
ALT: 19 U/L (ref 0–35)
AST: 17 U/L (ref 0–37)
BUN: 10 mg/dL (ref 6–23)
CO2: 28 mEq/L (ref 19–32)
CREATININE: 0.55 mg/dL (ref 0.50–1.10)
Calcium: 9.2 mg/dL (ref 8.4–10.5)
Chloride: 104 mEq/L (ref 96–112)
GLUCOSE: 95 mg/dL (ref 70–99)
POTASSIUM: 3.9 meq/L (ref 3.5–5.3)
Sodium: 138 mEq/L (ref 135–145)
Total Bilirubin: 0.4 mg/dL (ref 0.2–1.2)
Total Protein: 6.6 g/dL (ref 6.0–8.3)

## 2013-08-11 LAB — POCT GLYCOSYLATED HEMOGLOBIN (HGB A1C): Hemoglobin A1C: 6.9

## 2013-08-11 MED ORDER — ALBUTEROL SULFATE HFA 108 (90 BASE) MCG/ACT IN AERS: 2.0000 | INHALATION_SPRAY | Freq: Every day | RESPIRATORY_TRACT | Status: DC | PRN

## 2013-08-11 MED ORDER — BECLOMETHASONE DIPROPIONATE 40 MCG/ACT IN AERS: 2.0000 | INHALATION_SPRAY | Freq: Two times a day (BID) | RESPIRATORY_TRACT | Status: DC

## 2013-08-11 MED ORDER — OXYCODONE-ACETAMINOPHEN 10-325 MG PO TABS: 1.0000 | ORAL_TABLET | Freq: Four times a day (QID) | ORAL | Status: DC | PRN

## 2013-08-11 MED ORDER — TRIAMCINOLONE ACETONIDE 0.1 % EX CREA: 1.0000 "application " | TOPICAL_CREAM | Freq: Every day | CUTANEOUS | Status: DC | PRN

## 2013-08-11 MED ORDER — CLONAZEPAM 1 MG PO TABS: 1.0000 mg | ORAL_TABLET | Freq: Two times a day (BID) | ORAL | Status: DC | PRN

## 2013-08-11 MED ORDER — ROSUVASTATIN CALCIUM 10 MG PO TABS: 10.0000 mg | ORAL_TABLET | Freq: Every day | ORAL | Status: DC

## 2013-08-11 MED ORDER — IBUPROFEN 600 MG PO TABS: 600.0000 mg | ORAL_TABLET | Freq: Three times a day (TID) | ORAL | Status: DC | PRN

## 2013-08-11 MED ORDER — GLUCOSE BLOOD VI STRP: ORAL_STRIP | Status: DC

## 2013-08-11 MED ORDER — ESOMEPRAZOLE MAGNESIUM 40 MG PO CPDR: 40.0000 mg | DELAYED_RELEASE_CAPSULE | Freq: Every day | ORAL | Status: DC

## 2013-08-11 NOTE — Progress Notes (Signed)
Patient ID: Michelle [name], female   DOB: [birth year], 63 y.o.   MRN: [id number]  Michelle Alar, MD Phone: [phone]  Michelle [name] is a 63 y.o. female who presents today for discussion of right wrist pain, asthma, anxiety, and DM.  Right wrist pain: noted for the past month. Patient is requesting referral to hand surgeon that did her trigger finger release. Has noted a knot on the radial aspect and pain in this area. 8/10. Hurts any time she touches it. States was x-ray'd by her rheumatologist, though does not know the results of this. Has not tried any treatment for this yet.  Asthma: states is using albuterol inhaler daily. She wakes up at night with coughing. She states she is intermittently using the qvar.   DIABETES Disease Monitoring: Blood Sugar ranges-was 134 this am and 92 the previous day. Polyuria/phagia/dipsia- no      Medications: Compliance- taking Hypoglycemic symptoms- no Asks about farxiga for treatment of DM.  Anxiety: states klonipin is working well for her. Is taking this daily.  The following portions of the patient's history were reviewed and updated as appropriate: allergies, current medications, past medical history, family and social history, and problem list.  Patient is a former smoker.  Past Medical History  Diagnosis Date  . RA (rheumatoid arthritis)     CCP positive followed by [facility]  . HLD (hyperlipidemia)   . Diabetes mellitus   . Asthma   . Allergy   . Anxiety   . Cataract     beginning on left eye  . GERD (gastroesophageal reflux disease)   . Osteoporosis     History  Smoking status  . Former Smoker -- 1.50 packs/day for 5 years  . Types: Cigarettes  . Quit date: 05/31/1994  Smokeless tobacco  . Never Used    Family History  Problem Relation Age of Onset  . Stroke Father 54    died  . Lung cancer Brother 49    died  . Rheum arthritis Sister 74  . Cancer Sister     breast  . Cancer Daughter     colon  . Colon cancer  Maternal Aunt   . Esophageal cancer Neg Hx   . Rectal cancer Neg Hx   . Stomach cancer Neg Hx   . Cancer Mother     uterine  . Cancer Sister     colon    Current Outpatient Prescriptions on File Prior to Visit  Medication Sig Dispense Refill  . adalimumab (HUMIRA) 40 MG/0.8ML injection Inject 40 mg into the skin every 14 (fourteen) days.      Marland Kitchen adalimumab (HUMIRA) 40 MG/0.8ML injection Inject 40 mg into the skin once.      Marland Kitchen albuterol (PROVENTIL HFA;VENTOLIN HFA) 108 (90 BASE) MCG/ACT inhaler Inhale 2 puffs into the lungs daily as needed for wheezing or shortness of breath.      Marland Kitchen aspirin EC 81 MG tablet Take 81 mg by mouth daily.      Marland Kitchen azelastine (ASTELIN) 137 MCG/SPRAY nasal spray Place 2 sprays into both nostrils daily. Use in each nostril as directed      . beclomethasone (QVAR) 40 MCG/ACT inhaler Inhale 2 puffs into the lungs 2 (two) times daily.      . citalopram (CELEXA) 40 MG tablet Take 40 mg by mouth at bedtime.      . clonazePAM (KLONOPIN) 1 MG tablet Take 1 tablet (1 mg total) by mouth 3 (three) times daily as needed for  anxiety. Do not fill until 60 days after date on script.  90 tablet  0  . clonazePAM (KLONOPIN) 1 MG tablet Take 1 tablet (1 mg total) by mouth 2 (two) times daily as needed for anxiety. Do not fill until 60 days after date printed.  60 tablet  0  . clonazePAM (KLONOPIN) 1 MG tablet Take 1 tablet (1 mg total) by mouth 2 (two) times daily as needed for anxiety. Do not fill until 30 days after date printed.  60 tablet  0  . clonazePAM (KLONOPIN) 1 MG tablet Take 1 mg by mouth daily as needed for anxiety.      . clonazePAM (KLONOPIN) 2 MG tablet Take 2 mg by mouth 2 (two) times daily.      . cyclobenzaprine (FLEXERIL) 5 MG tablet Take 5 mg by mouth 3 (three) times daily as needed for muscle spasms.      . diclofenac sodium (VOLTAREN) 1 % GEL Apply 2 g topically 4 (four) times daily as needed (for pain).      Marland Kitchen docusate sodium (COLACE) 50 MG capsule Take 50 mg by  mouth at bedtime.      Marland Kitchen esomeprazole (NEXIUM) 40 MG capsule Take 40 mg by mouth daily at 12 noon.      . folic acid (FOLVITE) 1 MG tablet Take 1 mg by mouth daily.      Marland Kitchen gabapentin (NEURONTIN) 300 MG capsule Take 300 mg by mouth at bedtime.      Marland Kitchen levocetirizine (XYZAL) 5 MG tablet Take 5 mg by mouth every evening.      Marland Kitchen losartan (COZAAR) 50 MG tablet Take 50 mg by mouth daily.      . metFORMIN (GLUCOPHAGE) 1000 MG tablet Take 1,000 mg by mouth 2 (two) times daily with a meal.      . methotrexate (RHEUMATREX) 2.5 MG tablet Take 15 mg by mouth once a week. Saturdays. 6 tabs once a week      . metoprolol tartrate (LOPRESSOR) 25 MG tablet Take 25 mg by mouth 2 (two) times daily.      . mometasone (NASONEX) 50 MCG/ACT nasal spray Place 2 sprays into the nose daily.      . nitroGLYCERIN (NITROSTAT) 0.4 MG SL tablet Place 0.4 mg under the tongue every 5 (five) minutes as needed.        Marland Kitchen oxyCODONE-acetaminophen (PERCOCET) 10-325 MG per tablet Take 1 tablet by mouth every 6 (six) hours as needed for pain. Do not fill until 30 days from date it was printed.  120 tablet  0  . oxyCODONE-acetaminophen (PERCOCET) 10-325 MG per tablet Take 1 tablet by mouth every 6 (six) hours as needed for pain. Do not fill until 30 days after date printed.  120 tablet  0  . oxyCODONE-acetaminophen (PERCOCET) 10-325 MG per tablet Take 1 tablet by mouth every 6 (six) hours as needed for pain.      . rosuvastatin (CRESTOR) 10 MG tablet Take 10 mg by mouth daily.      . traZODone (DESYREL) 50 MG tablet Take 50 mg by mouth at bedtime as needed for sleep.       Marland Kitchen triamcinolone cream (KENALOG) 0.1 % Apply 1 application topically daily.       No current facility-administered medications on file prior to visit.    ROS: Per HPI   Physical Exam Filed Vitals:   08/11/13 1607  BP: 112/74  Pulse: 67  Temp: 98 F (36.7 C)  Physical Examination: General appearance - alert, well appearing, and in no distress Mental status  - normal mood, behavior, speech, dress, motor activity, and thought processes Chest - clear to auscultation, no wheezes, rales or rhonchi, symmetric air entry Heart - normal rate, regular rhythm, normal S1, S2, no murmurs, rubs, clicks or gallops Musculoskeletal - right wrist along the radial styloid process with small cystic lesion and tenderness, there is no overlying erythema, there is pain with palpation and finklesteins maneuver, there are no other noted lesions in the right hand   Assessment/Plan: Please see individual problem list.

## 2013-08-11 NOTE — Patient Instructions (Signed)
Nice to see you. Please take the ibuprofen up to 3 times a day for your wrist. Please ice this daily. Please schedule an appointment for 2 weeks for f/u. Use the qvar every day. The albuterol as needed.

## 2013-08-12 ENCOUNTER — Encounter: Payer: Self-pay | Admitting: *Deleted

## 2013-08-12 ENCOUNTER — Telehealth: Payer: Self-pay | Admitting: *Deleted

## 2013-08-12 MED ORDER — SIMVASTATIN 20 MG PO TABS: 20.0000 mg | ORAL_TABLET | Freq: Every day | ORAL | Status: DC

## 2013-08-12 NOTE — Telephone Encounter (Signed)
Received fax from [place] requesting prior authorization of Crestor 10 mg .  Form placed in MD's box for completion along with Medicaid formulary.  Burna Forts, BSN, RN-BC

## 2013-08-12 NOTE — Telephone Encounter (Signed)
Patient has not failed 2 of the preferred medications. I sent in a prescription for simvastatin for the patient to try in place of her crestor. She has previously failed lipitor. If she takes this and it does not work for her, we can fill out the prior authorization and resume crestor.

## 2013-08-15 DIAGNOSIS — M654 Radial styloid tenosynovitis [de Quervain]: Secondary | ICD-10-CM | POA: Insufficient documentation

## 2013-08-15 HISTORY — DX: Radial styloid tenosynovitis (de quervain): M65.4

## 2013-08-15 NOTE — Assessment & Plan Note (Signed)
Stable. Will continue current therapy. Patient would like to consider farxiga in the future.

## 2013-08-15 NOTE — Assessment & Plan Note (Signed)
Stable on klonipin BID prn. Will continue this.

## 2013-08-15 NOTE — Assessment & Plan Note (Signed)
Patient continues to not use qvar as previously advised. Discussed that regular use of this controller medication will help prevent her from needing to use her albuterol inhaler everyday. The goal is to get her off of the albuterol, which she does not seem to comprehend well after many attempts to discuss this with her. Will continue to have this discussion with the patient.

## 2013-08-15 NOTE — Assessment & Plan Note (Signed)
In right wrist. Positive finklesteins. Will try conservative measures. Is to ice this and use ibuprofen. Stretch as tolerated. If not improved, will try injection.

## 2013-08-17 ENCOUNTER — Ambulatory Visit (HOSPITAL_COMMUNITY)
Admission: RE | Admit: 2013-08-17 | Discharge: 2013-08-17 | Disposition: A | Discharge status: Home / Self Care | Payer: Medicaid Other | Source: Ambulatory Visit | Attending: Cardiology | Admitting: Cardiology

## 2013-08-17 ENCOUNTER — Encounter (HOSPITAL_COMMUNITY)
Admission: RE | Disposition: A | Discharge status: Home / Self Care | Payer: Self-pay | Source: Ambulatory Visit | Attending: Cardiology

## 2013-08-17 DIAGNOSIS — I739 Peripheral vascular disease, unspecified: Secondary | ICD-10-CM | POA: Insufficient documentation

## 2013-08-17 DIAGNOSIS — Z87891 Personal history of nicotine dependence: Secondary | ICD-10-CM | POA: Insufficient documentation

## 2013-08-17 DIAGNOSIS — R0609 Other forms of dyspnea: Secondary | ICD-10-CM | POA: Insufficient documentation

## 2013-08-17 DIAGNOSIS — I10 Essential (primary) hypertension: Secondary | ICD-10-CM | POA: Insufficient documentation

## 2013-08-17 DIAGNOSIS — R0989 Other specified symptoms and signs involving the circulatory and respiratory systems: Secondary | ICD-10-CM | POA: Insufficient documentation

## 2013-08-17 DIAGNOSIS — E7800 Pure hypercholesterolemia, unspecified: Secondary | ICD-10-CM | POA: Insufficient documentation

## 2013-08-17 DIAGNOSIS — E119 Type 2 diabetes mellitus without complications: Secondary | ICD-10-CM | POA: Insufficient documentation

## 2013-08-17 DIAGNOSIS — R079 Chest pain, unspecified: Secondary | ICD-10-CM | POA: Insufficient documentation

## 2013-08-17 HISTORY — PX: LEFT AND RIGHT HEART CATHETERIZATION WITH CORONARY ANGIOGRAM: SHX5449

## 2013-08-17 LAB — POCT I-STAT 3, ART BLOOD GAS (G3+)
ACID-BASE DEFICIT: 2 mmol/L (ref 0.0–2.0)
ACID-BASE DEFICIT: 2 mmol/L (ref 0.0–2.0)
Bicarbonate: 23 mEq/L (ref 20.0–24.0)
Bicarbonate: 25 mEq/L — ABNORMAL HIGH (ref 20.0–24.0)
O2 SAT: 98 %
O2 Saturation: 71 %
PH ART: 7.354 (ref 7.350–7.450)
TCO2: 24 mmol/L (ref 0–100)
TCO2: 26 mmol/L (ref 0–100)
pCO2 arterial: 41.3 mmHg (ref 35.0–45.0)
pCO2 arterial: 48.6 mmHg — ABNORMAL HIGH (ref 35.0–45.0)
pH, Arterial: 7.32 — ABNORMAL LOW (ref 7.350–7.450)
pO2, Arterial: 118 mmHg — ABNORMAL HIGH (ref 80.0–100.0)
pO2, Arterial: 41 mmHg — ABNORMAL LOW (ref 80.0–100.0)

## 2013-08-17 LAB — GLUCOSE, CAPILLARY
GLUCOSE-CAPILLARY: 129 mg/dL — AB (ref 70–99)
Glucose-Capillary: 104 mg/dL — ABNORMAL HIGH (ref 70–99)

## 2013-08-17 LAB — POCT I-STAT 3, VENOUS BLOOD GAS (G3P V)
ACID-BASE DEFICIT: 1 mmol/L (ref 0.0–2.0)
BICARBONATE: 25.6 meq/L — AB (ref 20.0–24.0)
O2 Saturation: 69 %
PH VEN: 7.321 — AB (ref 7.250–7.300)
TCO2: 27 mmol/L (ref 0–100)
pCO2, Ven: 49.5 mmHg (ref 45.0–50.0)
pO2, Ven: 39 mmHg (ref 30.0–45.0)

## 2013-08-17 LAB — POCT ACTIVATED CLOTTING TIME: Activated Clotting Time: 160 seconds

## 2013-08-17 SURGERY — LEFT AND RIGHT HEART CATHETERIZATION WITH CORONARY ANGIOGRAM
Anesthesia: LOCAL

## 2013-08-17 MED ORDER — SODIUM CHLORIDE 0.9 % IV SOLN: 1.0000 mL/kg/h | INTRAVENOUS | Status: DC

## 2013-08-17 MED ORDER — LIDOCAINE HCL (PF) 1 % IJ SOLN
INTRAMUSCULAR | Status: AC
  Filled 2013-08-17: qty 30

## 2013-08-17 MED ORDER — HEPARIN (PORCINE) IN NACL 2-0.9 UNIT/ML-% IJ SOLN
INTRAMUSCULAR | Status: AC
  Filled 2013-08-17: qty 1000

## 2013-08-17 MED ORDER — VERAPAMIL HCL 2.5 MG/ML IV SOLN
INTRAVENOUS | Status: AC
Start: 2013-08-17 — End: 2013-08-17
  Filled 2013-08-17: qty 2

## 2013-08-17 MED ORDER — SODIUM CHLORIDE 0.9 % IJ SOLN: 3.0000 mL | INTRAMUSCULAR | Status: DC | PRN

## 2013-08-17 MED ORDER — SODIUM CHLORIDE 0.9 % IV SOLN
INTRAVENOUS | Status: DC
  Administered 2013-08-17: 08:00:00 via INTRAVENOUS

## 2013-08-17 MED ORDER — FENTANYL CITRATE 0.05 MG/ML IJ SOLN
INTRAMUSCULAR | Status: AC
  Filled 2013-08-17: qty 2

## 2013-08-17 MED ORDER — SODIUM CHLORIDE 0.9 % IJ SOLN: 3.0000 mL | Freq: Two times a day (BID) | INTRAMUSCULAR | Status: DC

## 2013-08-17 MED ORDER — ASPIRIN 81 MG PO CHEW: 81.0000 mg | CHEWABLE_TABLET | ORAL | Status: DC

## 2013-08-17 MED ORDER — NITROGLYCERIN 0.2 MG/ML ON CALL CATH LAB
INTRAVENOUS | Status: AC
  Filled 2013-08-17: qty 1

## 2013-08-17 MED ORDER — HEPARIN SODIUM (PORCINE) 1000 UNIT/ML IJ SOLN
INTRAMUSCULAR | Status: AC
  Filled 2013-08-17: qty 1

## 2013-08-17 MED ORDER — MIDAZOLAM HCL 2 MG/2ML IJ SOLN
INTRAMUSCULAR | Status: AC
  Filled 2013-08-17: qty 2

## 2013-08-17 MED ORDER — SODIUM CHLORIDE 0.9 % IV SOLN: 250.0000 mL | INTRAVENOUS | Status: DC | PRN

## 2013-08-17 NOTE — Progress Notes (Signed)
Report received from [place] Richards,R.N.  Pt has 6cc of air left in her band.  Site unremarkable. No further bleeding at this time.

## 2013-08-17 NOTE — CV Procedure (Signed)
Procedures performed: Femoral access Left heart catheterization including left ventriculography, selective right and left coronary arteriography. Right heart catheterization and calculation of cardiac output and cardiac index by Fick..  Indication: Chest pain , dyspnea  DM , HTN . Normal stress test previously and continues to present with chest pain and dyspnea.  HEMODYNAMIC DATA: The left ventricle pressure was 96/3 with  EDP5. Aortic pressure was  93/66 with a mean of 78 mmHg.  There was no pressure gradient across the aortic valve   Right coronary artery: Right coronary is a large caliber vessel and a dominant vessel. It has mild luminal irregularity especially in the proximal segment.   Left main coronary artery: Non-existent, LAD and circumflex carotid artery have separate ostia.  Circumflex: Circumflex is a large caliber vessel, giving origin to a moderate sized OM1, small OM 2 , OM 3 and continues as a large OM 4 after giving origin to AV groove branch. There is mild luminal irregularity noted in the circumflex coronary artery. small obtuse marginal 1. Smooth and normal.   Ramus intermediate: NA.   LAD: LAD is a large caliber vessel, giving origin to several small  diagonals. The proximal LAD shows mild luminal irregularity  Left ventriculogram: Left ventriculography revealed ejection fraction of 50%. There was no wall motion abnormality. There is mild global hypokinesis. No symptoms restriction.  Right heart catheterization:  RA Pressure 10/6 Mean 6.mm Hg. RA Saturation 71% RV: 28/10 EDP 12 PA: 22/6 with mean of 14 mm Hg. PA Saturation 69% PCW: 8/7 with mean of 4 mm Hg. Aortic saturation:  98 Cardiac Output: 5.16, Cardiac Index: 2.85 by FICK method.    IMPRESSIONS:  1. No significant coronary artery disease, mild diffuse luminal irregularity constituting around 10-20%. right dominant circulation. LVEF: Low normal LVEF, 50% with mild global hypokinesis. 2. Right heart cath  revealing: Normal right heart pressure, no evidence of pulmonary hypertension, result cardio output and cardiac index. QP/QS was normal at 0.9 suggesting no significant right to left shunting.   TECHNIQUE PROCEDURE: Under sterile precautions using a 5-French right  radial arterial access and a 5-French right AC  vein access, a 5 F balloon-tip Swan-Ganz catheter was advanced into the right atrium, right ventricle and then into the pulmonary capillary wedge position via right femoral venous access. Right-sided hemodynamics the carefully performed and the data was carefully analyzed. The catheter was then pulled out of the body.   A 5 [place] Tig 4 catheter was advanced via right  radial  arterial access. The catheter was advanced into the left ventricle and left ventriculography was performed in the RAO projection.  The catheter was then pulled into the ascending aorta and selective right and left coronary arteriogram was performed. The cath was then pulled out of the body. Hemostasis was apparent by applying TR band at arterial access site and manual pressure at the venous side. Patient tolerated the procedure well. A total of 70 cc of contrast was utilized for diagnostic angiography.

## 2013-08-17 NOTE — Progress Notes (Signed)
Pts radial site bleed slightly.  2ccs of hair was reinserted.  Will continue to monitor closely and reasses.

## 2013-08-17 NOTE — Discharge Instructions (Signed)

## 2013-08-17 NOTE — Interval H&P Note (Signed)
History and Physical Interval Note:  08/17/2013 1:12 PM  Michelle [name]  has presented today for surgery, with the diagnosis of shortness of breath/cp  The various methods of treatment have been discussed with the patient and family. After consideration of risks, benefits and other options for treatment, the patient has consented to  Procedure(s): LEFT AND RIGHT HEART CATHETERIZATION WITH CORONARY ANGIOGRAM (N/A) as a surgical intervention .  The patient's history has been reviewed, patient examined, no change in status, stable for surgery.  I have reviewed the patient's chart and labs.  Questions were answered to the patient's satisfaction.   Cath Lab Visit (complete for each Cath Lab visit)  Clinical Evaluation Leading to the Procedure:   ACS: no  Non-ACS:    Anginal Classification: CCS III  Anti-ischemic medical therapy: No Therapy  Non-Invasive Test Results: Low-risk stress test findings: cardiac mortality <1%/year  Prior CABG: No previous CABG        [facility]

## 2013-08-17 NOTE — H&P (Signed)
  Please see office visit notes for complete details of HPI.  

## 2013-08-25 ENCOUNTER — Encounter: Payer: Self-pay | Admitting: Family Medicine

## 2013-08-31 ENCOUNTER — Other Ambulatory Visit: Payer: Self-pay | Admitting: Family Medicine

## 2013-09-02 ENCOUNTER — Other Ambulatory Visit: Payer: Self-pay | Admitting: Family Medicine

## 2013-09-02 ENCOUNTER — Ambulatory Visit (HOSPITAL_COMMUNITY)
Admission: RE | Admit: 2013-09-02 | Discharge: 2013-09-02 | Disposition: A | Discharge status: Home / Self Care | Payer: Medicaid Other | Source: Ambulatory Visit | Attending: Family Medicine | Admitting: Family Medicine

## 2013-09-02 ENCOUNTER — Ambulatory Visit (INDEPENDENT_AMBULATORY_CARE_PROVIDER_SITE_OTHER): Payer: Medicaid Other | Admitting: Family Medicine

## 2013-09-02 ENCOUNTER — Encounter: Payer: Self-pay | Admitting: Family Medicine

## 2013-09-02 ENCOUNTER — Telehealth: Payer: Self-pay | Admitting: *Deleted

## 2013-09-02 VITALS — BP 138/80 | HR 76 | Temp 99.5°F | Wt 173.0 lb

## 2013-09-02 DIAGNOSIS — M25512 Pain in left shoulder: Secondary | ICD-10-CM

## 2013-09-02 DIAGNOSIS — M25519 Pain in unspecified shoulder: Secondary | ICD-10-CM

## 2013-09-02 DIAGNOSIS — E119 Type 2 diabetes mellitus without complications: Secondary | ICD-10-CM

## 2013-09-02 DIAGNOSIS — M19019 Primary osteoarthritis, unspecified shoulder: Secondary | ICD-10-CM | POA: Insufficient documentation

## 2013-09-02 LAB — URIC ACID: URIC ACID, SERUM: 2.1 mg/dL — AB (ref 2.4–7.0)

## 2013-09-02 MED ORDER — PREDNISONE 50 MG PO TABS: 50.0000 mg | ORAL_TABLET | Freq: Every day | ORAL | Status: DC

## 2013-09-02 MED ORDER — DAPAGLIFLOZIN PROPANEDIOL 5 MG PO TABS: 5.0000 mg | ORAL_TABLET | Freq: Every day | ORAL | Status: DC

## 2013-09-02 NOTE — Telephone Encounter (Signed)
[facility] for [place] 5 mg tab. Formulary and PA form placed in provider box for completion. Derl Barrow, RN

## 2013-09-02 NOTE — Patient Instructions (Signed)
Nice to see you again. Please go to the radiology department in the hospital and have an x-ray of your shoulder done. Take the prednisone for the next week and then complete your dose pack. If you are continuing to have pain next week please call for a follow-up appointment for an injection. We will call with the results of the uric acid level.

## 2013-09-03 ENCOUNTER — Encounter: Payer: Self-pay | Admitting: Family Medicine

## 2013-09-04 DIAGNOSIS — M25512 Pain in left shoulder: Secondary | ICD-10-CM | POA: Insufficient documentation

## 2013-09-04 NOTE — Progress Notes (Signed)
Patient ID: Michelle [name], female   DOB: 1950-12-18, 63 y.o.   MRN: [id number]  Tommi Rumps, MD Phone: [phone]  Michelle [name] is a 63 y.o. female who presents today for left shoulder pain  Started last night. Does not remember an injury. Notes she can't move the shoulder. It is a sharp pain in the anterior portion of the shoulder. 10/10. Percocet did not help. She notes having 2 previous surgeries on this shoulder. She is requesting an injection of her shoulder.  She notes her rheumatologist wants to check a uric acid level due to concern for gout.  She wants to start [place] for her DM. This was discussed at her last office visit.  The following portions of the patient's history were reviewed and updated as appropriate: allergies, current medications, past medical history, family and social history, and problem list.  Patient is a former smoker.  Past Medical History  Diagnosis Date  . RA (rheumatoid arthritis)     CCP positive followed by [facility]  . HLD (hyperlipidemia)   . Diabetes mellitus   . Asthma   . Allergy   . Anxiety   . Cataract     beginning on left eye  . GERD (gastroesophageal reflux disease)   . Osteoporosis     History  Smoking status  . Former Smoker -- 1.50 packs/day for 5 years  . Types: Cigarettes  . Quit date: 05/31/1994  Smokeless tobacco  . Never Used    Family History  Problem Relation Age of Onset  . Stroke Father 7    died  . Lung cancer Brother 74    died  . Rheum arthritis Sister 19  . Cancer Sister     breast  . Cancer Daughter     colon  . Colon cancer Maternal Aunt   . Esophageal cancer Neg Hx   . Rectal cancer Neg Hx   . Stomach cancer Neg Hx   . Cancer Mother     uterine  . Cancer Sister     colon    Current Outpatient Prescriptions on File Prior to Visit  Medication Sig Dispense Refill  . adalimumab (HUMIRA) 40 MG/0.8ML injection Inject 40 mg into the skin every 14 (fourteen) days.      Marland Kitchen adalimumab (HUMIRA)  40 MG/0.8ML injection Inject 40 mg into the skin once.      Marland Kitchen albuterol (PROVENTIL HFA;VENTOLIN HFA) 108 (90 BASE) MCG/ACT inhaler Inhale 2 puffs into the lungs daily as needed for wheezing or shortness of breath.  2 Inhaler  1  . aspirin EC 81 MG tablet Take 81 mg by mouth daily.      Marland Kitchen azelastine (ASTELIN) 137 MCG/SPRAY nasal spray Place 2 sprays into both nostrils daily. Use in each nostril as directed      . beclomethasone (QVAR) 40 MCG/ACT inhaler Inhale 2 puffs into the lungs 2 (two) times daily.  1 Inhaler  11  . citalopram (CELEXA) 40 MG tablet Take 40 mg by mouth at bedtime.      . clonazePAM (KLONOPIN) 1 MG tablet Take 1 tablet (1 mg total) by mouth 2 (two) times daily as needed for anxiety. Do not fill until 60 days after date printed.  60 tablet  0  . clonazePAM (KLONOPIN) 1 MG tablet Take 1 tablet (1 mg total) by mouth 2 (two) times daily as needed for anxiety. Do not fill until 30 days after date printed.  60 tablet  0  . clonazePAM (KLONOPIN) 1  MG tablet Take 1 tablet (1 mg total) by mouth 2 (two) times daily as needed for anxiety.  60 tablet  0  . clonazePAM (KLONOPIN) 2 MG tablet Take 2 mg by mouth 2 (two) times daily.      . cyclobenzaprine (FLEXERIL) 5 MG tablet Take 5 mg by mouth 3 (three) times daily as needed for muscle spasms.      . diclofenac sodium (VOLTAREN) 1 % GEL Apply 2 g topically 4 (four) times daily as needed (for pain).      Marland Kitchen docusate sodium (COLACE) 50 MG capsule Take 50 mg by mouth at bedtime.      Marland Kitchen esomeprazole (NEXIUM) 40 MG capsule Take 1 capsule (40 mg total) by mouth daily at 12 noon.  30 capsule  3  . gabapentin (NEURONTIN) 300 MG capsule Take 300 mg by mouth at bedtime.      Marland Kitchen glucose blood test strip Use as instructed  100 each  12  . ibuprofen (ADVIL,MOTRIN) 600 MG tablet Take 1 tablet (600 mg total) by mouth every 8 (eight) hours as needed.  30 tablet  0  . levocetirizine (XYZAL) 5 MG tablet Take 5 mg by mouth every evening.      Marland Kitchen losartan (COZAAR)  50 MG tablet Take 50 mg by mouth daily.      Marland Kitchen losartan (COZAAR) 50 MG tablet TAKE 1 TABLET DAILY. **PRIOR AUTH REQ- FAXED MD  90 tablet  0  . metFORMIN (GLUCOPHAGE) 1000 MG tablet Take 1,000 mg by mouth 2 (two) times daily with a meal.      . metoprolol tartrate (LOPRESSOR) 25 MG tablet Take 25 mg by mouth 2 (two) times daily.      . mometasone (NASONEX) 50 MCG/ACT nasal spray Place 2 sprays into the nose daily.      . nitroGLYCERIN (NITROSTAT) 0.4 MG SL tablet Place 0.4 mg under the tongue every 5 (five) minutes as needed.        Marland Kitchen oxyCODONE-acetaminophen (PERCOCET) 10-325 MG per tablet Take 1 tablet by mouth every 6 (six) hours as needed for pain. Do not fill until 30 days from date it was printed.  120 tablet  0  . oxyCODONE-acetaminophen (PERCOCET) 10-325 MG per tablet Take 1 tablet by mouth every 6 (six) hours as needed for pain. Do not fill until 30 days after date printed.  120 tablet  0  . oxyCODONE-acetaminophen (PERCOCET) 10-325 MG per tablet Take 1 tablet by mouth every 6 (six) hours as needed for pain.  120 tablet  0  . rosuvastatin (CRESTOR) 10 MG tablet Take 1 tablet (10 mg total) by mouth daily.  30 tablet  3  . simvastatin (ZOCOR) 20 MG tablet Take 1 tablet (20 mg total) by mouth at bedtime.  30 tablet  2  . traZODone (DESYREL) 50 MG tablet Take 50 mg by mouth at bedtime as needed for sleep.       Marland Kitchen triamcinolone cream (KENALOG) 0.1 % Apply 1 application topically daily as needed.  30 g  0   No current facility-administered medications on file prior to visit.    ROS: Per HPI   Physical Exam Filed Vitals:   09/02/13 0831  BP: 138/80  Pulse: 76  Temp: 99.5 F (37.5 C)    Physical Examination: General appearance - alert, well appearing, and in no distress Musculoskeletal - left shoulder with no erythema or swelling, there is very minimal ROM though this seems to be limited by pain and not  by structural abnormality, there is tenderness to palpation over the biceps tendon  and the coracoid process, her right shoulder has no abnormalities noted and has full ROM   Assessment/Plan: Please see individual problem list.

## 2013-09-04 NOTE — Assessment & Plan Note (Signed)
Patient at goal on metformin. Will give trial of farxiga. Discussed potential for yeast infections with this medication.

## 2013-09-04 NOTE — Assessment & Plan Note (Signed)
Acute onset. Given location of pain and limitation of movement this is most likely an impingement syndrome vs rotator cuff injury vs frozen shoulder. Given baseline prednisone use, will increase to 50 mg daily for 7 days, then to continue previous taper, for inflammation. To ice and use topical NSAID. Will obtain x-ray as well. F/u next week if not improved.

## 2013-09-06 NOTE — Telephone Encounter (Signed)
Please inform the patient that farxiga is not a preferred drug for medicaid. We can try the preferred drug or stick with metformin as she is already at goal with this medication. Thanks.

## 2013-09-07 NOTE — Telephone Encounter (Signed)
Spoke with patient and she states that when her daughter was seen here last one of the pharmacist came in and filled out something on the computer so she could get farxiga free for a whole year.  [name] would like to see if one of the pharmacist here can help her with this too.  She is fine if she has to come in and be seen by Dr. Valentina Lucks.  Michelle [name]

## 2013-09-07 NOTE — Telephone Encounter (Signed)
I'll check with Dr Valentina Lucks on this today and will let you know as soon as I talk to him.

## 2013-09-08 NOTE — Telephone Encounter (Signed)
I discussed this with Dr Valentina Lucks. Given the patients well controlled A1c on metformin her did not recommend switching her to the new medication. He did note there is a form that could be filled out to get her this medication, though he did not recommend this for her. At this time we will hold off on making this change and can have further discussions regarding this in the future. Please inform the patient.

## 2013-09-09 NOTE — Telephone Encounter (Signed)
LMOVM for pt to return call .Michelle [name]  

## 2013-09-20 ENCOUNTER — Telehealth: Payer: Self-pay | Admitting: *Deleted

## 2013-09-20 NOTE — Telephone Encounter (Signed)
Form filled out and returned to Tamika Martin.  

## 2013-09-20 NOTE — Telephone Encounter (Signed)
[facility] 10 [facility]. Formulary and PA form placed in provider box for completion. Derl Barrow, RN

## 2013-09-21 NOTE — Telephone Encounter (Signed)
Received PA approval for Crestor 10 mg tab via [employer].  Med approved for 09/21/13 - 09/21/14.  [facility].  PA confirmation number [id number] Teresita Madura, Rosine Beat, RN

## 2013-09-21 NOTE — Telephone Encounter (Signed)
PA pending for Crestor. Confirmation number [id number] Lilia Pro, RN

## 2013-10-02 ENCOUNTER — Other Ambulatory Visit: Payer: Self-pay | Admitting: Family Medicine

## 2013-10-15 ENCOUNTER — Ambulatory Visit: Payer: Medicaid Other | Admitting: Family Medicine

## 2013-10-16 ENCOUNTER — Other Ambulatory Visit: Payer: Self-pay | Admitting: Family Medicine

## 2013-10-20 ENCOUNTER — Other Ambulatory Visit: Payer: Self-pay | Admitting: Family Medicine

## 2013-10-29 ENCOUNTER — Ambulatory Visit (INDEPENDENT_AMBULATORY_CARE_PROVIDER_SITE_OTHER): Payer: Medicaid Other | Admitting: Family Medicine

## 2013-10-29 ENCOUNTER — Encounter: Payer: Self-pay | Admitting: Family Medicine

## 2013-10-29 VITALS — BP 136/86 | HR 80 | Temp 98.5°F | Wt 169.0 lb

## 2013-10-29 DIAGNOSIS — F329 Major depressive disorder, single episode, unspecified: Secondary | ICD-10-CM

## 2013-10-29 DIAGNOSIS — M069 Rheumatoid arthritis, unspecified: Secondary | ICD-10-CM

## 2013-10-29 DIAGNOSIS — E119 Type 2 diabetes mellitus without complications: Secondary | ICD-10-CM

## 2013-10-29 DIAGNOSIS — F419 Anxiety disorder, unspecified: Secondary | ICD-10-CM

## 2013-10-29 DIAGNOSIS — E785 Hyperlipidemia, unspecified: Secondary | ICD-10-CM

## 2013-10-29 DIAGNOSIS — F341 Dysthymic disorder: Secondary | ICD-10-CM

## 2013-10-29 DIAGNOSIS — K219 Gastro-esophageal reflux disease without esophagitis: Secondary | ICD-10-CM

## 2013-10-29 LAB — COMPREHENSIVE METABOLIC PANEL
ALT: 19 U/L (ref 0–35)
AST: 13 U/L (ref 0–37)
Albumin: 4 g/dL (ref 3.5–5.2)
Alkaline Phosphatase: 69 U/L (ref 39–117)
BUN: 11 mg/dL (ref 6–23)
CALCIUM: 9.3 mg/dL (ref 8.4–10.5)
CO2: 29 meq/L (ref 19–32)
Chloride: 100 mEq/L (ref 96–112)
Creat: 0.59 mg/dL (ref 0.50–1.10)
Glucose, Bld: 101 mg/dL — ABNORMAL HIGH (ref 70–99)
POTASSIUM: 4.1 meq/L (ref 3.5–5.3)
Sodium: 136 mEq/L (ref 135–145)
TOTAL PROTEIN: 6.4 g/dL (ref 6.0–8.3)
Total Bilirubin: 0.4 mg/dL (ref 0.2–1.2)

## 2013-10-29 LAB — POCT GLYCOSYLATED HEMOGLOBIN (HGB A1C): Hemoglobin A1C: 7.6

## 2013-10-29 LAB — LIPID PANEL
Cholesterol: 94 mg/dL (ref 0–200)
HDL: 54 mg/dL (ref >39)
LDL CALC: 29 mg/dL (ref 0–99)
Total CHOL/HDL Ratio: 1.7 Ratio
Triglycerides: 57 mg/dL (ref <150)
VLDL: 11 mg/dL (ref 0–40)

## 2013-10-29 MED ORDER — OXYCODONE-ACETAMINOPHEN 10-325 MG PO TABS: 1.0000 | ORAL_TABLET | Freq: Four times a day (QID) | ORAL | Status: DC | PRN

## 2013-10-29 MED ORDER — CLONAZEPAM 1 MG PO TABS: 1.0000 mg | ORAL_TABLET | Freq: Two times a day (BID) | ORAL | Status: DC | PRN

## 2013-10-29 MED ORDER — ESOMEPRAZOLE MAGNESIUM 40 MG PO CPDR: 40.0000 mg | DELAYED_RELEASE_CAPSULE | Freq: Every day | ORAL | Status: DC

## 2013-10-29 NOTE — Assessment & Plan Note (Signed)
Stable on current dosing of klonipin. Will continue this regimen. Refills given. F/u in 3 months.

## 2013-10-29 NOTE — Patient Instructions (Addendum)
Nice to see you.  We will obtain some labs today and will let you know the results.  Please keep your follow-up with your rheumatologist.  I will touch base with our pharmacist regarding the [place].

## 2013-10-29 NOTE — Progress Notes (Signed)
Patient ID: Michelle [name], female   DOB: 1951/02/13, 63 y.o.   MRN: [id number]  Tommi Rumps, MD Phone: [phone]  Michelle [name] is a 63 y.o. female who presents today for f/u.  DIABETES Disease Monitoring: Blood Sugar ranges-not checking Polyuria/phagia/dipsia- no      Medications: Compliance- taking metformin Hypoglycemic symptoms- no Patient continues to request farxiga, though her insurance denied this medication when previously prescribed.  GERD: patient notes needing a refill on nexium. States this is the only PPI that helps with her reflux. Last time she went the insurance company denied this and she had to get omeprazole. This does not work as well and she notes having reflux while being off her nexium.  Arthritis: states her hands have been really bad recently. She went to see her rheumatologist about this and they placed her back on methotrexate and prednisone. Notes it was the worst in her right wrist and several of her fingers. She has taken 2 doses of methotrexate so far and has not noticed much of a difference. She is also on humira. Notes taking percocet to help control pain and that she could not get along without this medication.  Anxiety: patient is on klonipin. States this regimen is working well for her and she is not having issues with her anxiety at this time. She needs refills on her klonipin at this time.   Patient is a former smoker.   ROS: Per HPI   Physical Exam Filed Vitals:   10/29/13 0832  BP: 136/86  Pulse: 80  Temp: 98.5 F (36.9 C)    Gen: Well NAD HEENT: EOMI,  MMM Lungs: CTABL Nl WOB Heart: RRR no MRG Exts: Non edematous BL  LE, warm and well perfused.  MSK: bilateral monofilament foot exam with sensation intact, note right radial aspect of wrist with a palpable nodule, mildly tender, no noted joint swelling in her fingers or hands or feet   Assessment/Plan: Please see individual problem list.

## 2013-10-29 NOTE — Assessment & Plan Note (Signed)
A1c worse than previously, though close to goal at 7.6. Likely related to going back on prednisone. Will continue metformin at current dosing for now and will encourage diet and exercise. F/u in 3 months.

## 2013-10-29 NOTE — Assessment & Plan Note (Addendum)
Pain is worsened since I last saw her. Rheum added back methotrexate and prednisone. Has yet to see benefit from these. Advised to continue f/u with rheum and that they may need to consider switch from humira as she has had increasing flares recently. Will refill percocet. F/u in 3 months. Will check CMET for monitoring of LFTs.

## 2013-10-29 NOTE — Assessment & Plan Note (Signed)
Patient has not been able to get nexium due to insurance issues. Will refill nexium and fill out prior auth when it is sent to [place].

## 2013-10-29 NOTE — Assessment & Plan Note (Signed)
Has been one year since cholesterol checked. Will obtain lipid panel today.

## 2013-10-31 ENCOUNTER — Other Ambulatory Visit: Payer: Self-pay | Admitting: Family Medicine

## 2013-11-01 ENCOUNTER — Encounter: Payer: Self-pay | Admitting: Family Medicine

## 2013-11-11 ENCOUNTER — Telehealth: Payer: Self-pay | Admitting: Family Medicine

## 2013-11-11 MED ORDER — ESOMEPRAZOLE MAGNESIUM 40 MG PO CPDR: 40.0000 mg | DELAYED_RELEASE_CAPSULE | Freq: Every day | ORAL | Status: DC

## 2013-11-11 NOTE — Telephone Encounter (Signed)
Patient states the pharmacy will only wants to give her generic of Nexium. Needs RX to state brand name only/

## 2013-11-11 NOTE — Telephone Encounter (Signed)
Sent Rx back to pharmacy with dispense as written for brand name.

## 2013-11-12 ENCOUNTER — Telehealth: Payer: Self-pay | Admitting: Family Medicine

## 2013-11-12 NOTE — Telephone Encounter (Signed)
Pt and pharmacy stated that the prescription for Nexium  Must say on it NAME BRAND [employer] for her insurance to cover. Please send a new prescription today for that please. jw

## 2013-12-06 ENCOUNTER — Other Ambulatory Visit: Payer: Self-pay | Admitting: Family Medicine

## 2013-12-14 ENCOUNTER — Other Ambulatory Visit: Payer: Self-pay | Admitting: Family Medicine

## 2013-12-14 ENCOUNTER — Ambulatory Visit (HOSPITAL_COMMUNITY)
Admission: RE | Admit: 2013-12-14 | Discharge: 2013-12-14 | Disposition: A | Discharge status: Home / Self Care | Payer: Medicaid Other | Source: Ambulatory Visit | Attending: Family Medicine | Admitting: Family Medicine

## 2013-12-14 DIAGNOSIS — R059 Cough, unspecified: Secondary | ICD-10-CM

## 2013-12-14 DIAGNOSIS — R05 Cough: Secondary | ICD-10-CM

## 2013-12-14 DIAGNOSIS — R0989 Other specified symptoms and signs involving the circulatory and respiratory systems: Secondary | ICD-10-CM | POA: Diagnosis not present

## 2013-12-16 ENCOUNTER — Encounter: Payer: Self-pay | Admitting: *Deleted

## 2013-12-16 NOTE — Progress Notes (Signed)
Prior Authorization received from [facility] for Nexium 40 mg capsule. Formulary and PA form placed in provider box for completion. Derl Barrow, RN

## 2013-12-17 NOTE — Progress Notes (Signed)
Patient ID: Michelle [name], female   DOB: 1951/04/25, 63 y.o.   MRN: [id number] Form filled out and returned to [facility].

## 2013-12-17 NOTE — Progress Notes (Signed)
PA for Nexium pending per [employer] [employer].  Confirmation number: [id number] Lilia Pro, RN  Received PA approval for Nexium via [place] [employer].  Med approved for 12/17/13 - 12/17/14.  [facility] informed.  PA confirmation number [id number] Teresita Madura, Rosine Beat, RN

## 2013-12-19 ENCOUNTER — Other Ambulatory Visit: Payer: Self-pay | Admitting: Family Medicine

## 2014-01-12 ENCOUNTER — Telehealth: Payer: Self-pay | Admitting: *Deleted

## 2014-01-12 NOTE — Telephone Encounter (Signed)
Nicole Kindred, Pharmacist with [facility] called stating that brought in a Rx for oxycodone 5/325 #90 prescribed by Donzetta Kohut, PA with [facility].  She also had a Rx for oxycodone 5/325 #20 prescribe on 12/02/2013 prescribe by same PA.  Pt took a Rx for hydrocodone #50 to [facility] on 12/15/2013.  Nicole Kindred stated he refused to fill Rx that pt had this morning.  This is just an [place].  If you have any question please give him a call at [phone].  Derl Barrow, RN

## 2014-01-18 ENCOUNTER — Other Ambulatory Visit: Payer: Self-pay | Admitting: Family Medicine

## 2014-01-20 ENCOUNTER — Other Ambulatory Visit: Payer: Self-pay | Admitting: Family Medicine

## 2014-01-25 ENCOUNTER — Encounter: Payer: Self-pay | Admitting: Family Medicine

## 2014-01-25 ENCOUNTER — Ambulatory Visit (INDEPENDENT_AMBULATORY_CARE_PROVIDER_SITE_OTHER): Payer: Medicaid Other | Admitting: Family Medicine

## 2014-01-25 VITALS — BP 112/68 | HR 80 | Temp 98.6°F | Ht 63.0 in | Wt 166.0 lb

## 2014-01-25 DIAGNOSIS — R682 Dry mouth, unspecified: Secondary | ICD-10-CM

## 2014-01-25 DIAGNOSIS — E119 Type 2 diabetes mellitus without complications: Secondary | ICD-10-CM

## 2014-01-25 DIAGNOSIS — R1310 Dysphagia, unspecified: Secondary | ICD-10-CM

## 2014-01-25 DIAGNOSIS — K117 Disturbances of salivary secretion: Secondary | ICD-10-CM

## 2014-01-25 DIAGNOSIS — G252 Other specified forms of tremor: Secondary | ICD-10-CM

## 2014-01-25 DIAGNOSIS — M069 Rheumatoid arthritis, unspecified: Secondary | ICD-10-CM

## 2014-01-25 DIAGNOSIS — R259 Unspecified abnormal involuntary movements: Secondary | ICD-10-CM

## 2014-01-25 LAB — POCT GLYCOSYLATED HEMOGLOBIN (HGB A1C): Hemoglobin A1C: 7.2

## 2014-01-25 MED ORDER — CLONAZEPAM 1 MG PO TABS: 1.0000 mg | ORAL_TABLET | Freq: Two times a day (BID) | ORAL | Status: DC | PRN

## 2014-01-25 MED ORDER — OXYCODONE-ACETAMINOPHEN 10-325 MG PO TABS: 1.0000 | ORAL_TABLET | Freq: Four times a day (QID) | ORAL | Status: DC | PRN

## 2014-01-25 NOTE — Patient Instructions (Addendum)
Nice to see you. Your dry mouth may be related to your celexa. You should try to maintain good hydration with frequent sips of water. Should try to keep your nasal passages open to avoid mouth breathing. You should avoid acidic drinks such as coffee and soda. You can use sugar free candies or lozenges to help with this.  If this continues to be an issue, we may need to consider stopping your celexa. You will be contacted by GI for an appointment.  I have refilled your medications.

## 2014-01-26 DIAGNOSIS — R1310 Dysphagia, unspecified: Secondary | ICD-10-CM | POA: Insufficient documentation

## 2014-01-26 DIAGNOSIS — R682 Dry mouth, unspecified: Secondary | ICD-10-CM | POA: Insufficient documentation

## 2014-01-26 MED ORDER — PROPRANOLOL HCL 40 MG PO TABS: 40.0000 mg | ORAL_TABLET | Freq: Two times a day (BID) | ORAL | Status: DC

## 2014-01-26 MED ORDER — MOMETASONE FUROATE 50 MCG/ACT NA SUSP: 2.0000 | Freq: Every day | NASAL | Status: DC

## 2014-01-26 NOTE — Progress Notes (Signed)
Patient ID: Michelle [name], female   DOB: 1951/03/02, 63 y.o.   MRN: [id number]  Tommi Rumps, MD Phone: [phone]  Michelle [name] is a 63 y.o. female who presents today for f/u.  DIABETES Disease Monitoring: Blood Sugar ranges-not checking frequently, notes it was 237 several weeks ago Polyuria/phagia/dipsia- no      Visual problems- no Medications: Compliance- taking metformin 1000 mg BID Hypoglycemic symptoms- no  Tremor: notes this has been present for many years. She notes she has family members with tremors. She has gotten to the point that she can't hardly hold a plate. Nothing makes it better. It is getting worse. She does not drink alcohol. The tremor is only there with movement.  Dry mouth: patient notes this has been an issue for the past 4-5 months. She notes that none of her medications have changes. She notes dry eyes as well. She has been using sugar free gum to help with this. She drinks several cups of coffee and has one soda per day.  RA: patient followed by [facility] rheum. She has a new rheumatologist there now. They have taken her off the methotrexate. Since they did this she has had multiple flares that have required continuous prednisone. She is just on humira at this time. She does not know how long she will be on prednisone. She has f/u with rheum on Monday.   Swallowing issues: notes for the past 2-3 months has had trouble swallowing her food. No trouble with liquids. States it intermittently feels as though it gets stuck going down. She sometime has to bring her food back up. She has not noticed any hematemesis. She does not drink alcohol. She quit smoking 20 years ago. She has previously seen Dr Ardis Hughs for this in 2013 and had an EGD that revealed gastroparesis. Per review of his note she was to f/u if she had repeat problems. Note patient is down 9 pounds since 05/2013.   ROS: Per HPI   Physical Exam Filed Vitals:   01/25/14 1358  BP: 112/68  Pulse: 80    Temp: 98.6 F (37 C)    Gen: Well NAD HEENT: PERRL,  MMM Lungs: CTABL Nl WOB Heart: RRR no MRG Neuro: CN 2-12 intact, 5/5 strength in bilateral biceps, triceps, grip, quads, hamstrings, plantar and dorsiflexion, sensation to light touch intact, normal gait, minimal left handed resting tremor that worsens significantly on finger to nose, normal monofilament Exts: Non edematous BL  LE, warm and well perfused.    Assessment/Plan: Please see individual problem list.  # Healthcare maintenance: foot exam done today

## 2014-01-26 NOTE — Assessment & Plan Note (Signed)
Has had recent flares of this treated by [facility] Rheum. Patient is to discuss other options for treatment with rheumatology. Patient to continue f/u with them. Refilled patients percocet today for 3 months. F/u in 3 months.

## 2014-01-26 NOTE — Assessment & Plan Note (Signed)
Patient with new onset of dry mouth. Celexa has this side effect though patient has been on this medication for years. She does drink acidic drinks in coffee and soda that could contribute. She endorses dry eyes as well which makes sjogrens a possibility especially with her other autoimmune issues. Discussed dietary changes and using sugar free hard candies. Discussed discontinuing celexa though she was not receptive to this. Will f/u in 1 month.

## 2014-01-26 NOTE — Assessment & Plan Note (Signed)
Patient with history of this in the past and found to have gastroparesis. Has lost weight so concern would be for a structural issue causing this problem. Will refer back to Dr Ardis Hughs office for follow-up.

## 2014-01-26 NOTE — Assessment & Plan Note (Signed)
Patient does not appear to be on propranolol anymore. Per review of records she had improvement on this previously. Will give trial of this again. F/u in one month.

## 2014-01-26 NOTE — Assessment & Plan Note (Signed)
At goal. Will continue current metformin dosing. F/u in 3 months.

## 2014-01-28 ENCOUNTER — Ambulatory Visit: Payer: Medicaid Other | Admitting: Family Medicine

## 2014-01-28 ENCOUNTER — Telehealth: Payer: Self-pay | Admitting: Family Medicine

## 2014-01-28 ENCOUNTER — Ambulatory Visit (INDEPENDENT_AMBULATORY_CARE_PROVIDER_SITE_OTHER): Payer: Medicaid Other | Admitting: Family Medicine

## 2014-01-28 VITALS — BP 117/60 | HR 78 | Temp 99.9°F | Ht 63.0 in | Wt 164.0 lb

## 2014-01-28 DIAGNOSIS — R52 Pain, unspecified: Secondary | ICD-10-CM

## 2014-01-28 DIAGNOSIS — R300 Dysuria: Secondary | ICD-10-CM

## 2014-01-28 DIAGNOSIS — R109 Unspecified abdominal pain: Secondary | ICD-10-CM

## 2014-01-28 DIAGNOSIS — R102 Pelvic and perineal pain: Secondary | ICD-10-CM | POA: Insufficient documentation

## 2014-01-28 DIAGNOSIS — R1030 Lower abdominal pain, unspecified: Secondary | ICD-10-CM

## 2014-01-28 LAB — POCT URINALYSIS DIPSTICK
Bilirubin, UA: NEGATIVE
Glucose, UA: 500
Ketones, UA: NEGATIVE
NITRITE UA: NEGATIVE
Protein, UA: 30
Spec Grav, UA: 1.01
Urobilinogen, UA: 0.2
pH, UA: 7

## 2014-01-28 LAB — POCT UA - MICROSCOPIC ONLY

## 2014-01-28 MED ORDER — CEPHALEXIN 500 MG PO CAPS: 500.0000 mg | ORAL_CAPSULE | Freq: Four times a day (QID) | ORAL | Status: DC

## 2014-01-28 NOTE — Addendum Note (Signed)
Addended by: [place] Pink on: 01/28/2014 03:35 PM   Modules accepted: Orders

## 2014-01-28 NOTE — Telephone Encounter (Signed)
Discussed results on the phone with pt, will tx with Keflex for possible UTI.  Will not send for Cx at this point as very infrequent episodes of this.  Did have large amount of blood on her UA which suspect is 2/2 her UTI but would recommend f/u in 3-4 weeks to assure resolution.   Tamela Oddi Awanda Mink, DO of [facility] 01/28/2014, 2:31 PM

## 2014-01-28 NOTE — Assessment & Plan Note (Signed)
DDx includes UTI, pyelo, or nephrolithiasis.  UA pending and will tx accordingly with keflex if +.  No systemic signs at this time and recommended f/u early next week if no improvement and recommended immediate evaluation if starts to have worsening R flank pain or fevers.

## 2014-01-28 NOTE — Patient Instructions (Signed)
Michelle [name], it was nice seeing you today.  Please call back Monday or Tuesday if you are not feeling better.  If you start to have fever and severe plank pain or start to have blood in your urine, please go to the Emergency Room or an urgent care.  Thanks, Dr. Awanda Mink

## 2014-01-28 NOTE — Progress Notes (Signed)
Michelle [name] is a 63 y.o. female who presents today for polyuria/increased frequency/suprapubic pain.  Pt states these Sx began about two days ago now and has noticed that she has urinated about 30 x with increased urgency and polyuria.  She denies specifically any dysuria and denies any fever, chills, sweats, hematuria, lower extremity edema, shortness of breath.  Does have some R CVA pain but no radiation to anywhere else and denies radiation into her groin.  Has been staying hydrated and denies having nephrolithiasis prior.   Past Medical History  Diagnosis Date  . RA (rheumatoid arthritis)     CCP positive followed by [facility]  . HLD (hyperlipidemia)   . Diabetes mellitus   . Asthma   . Allergy   . Anxiety   . Cataract     beginning on left eye  . GERD (gastroesophageal reflux disease)   . Osteoporosis     History  Smoking status  . Former Smoker -- 1.50 packs/day for 5 years  . Types: Cigarettes  . Quit date: 05/31/1994  Smokeless tobacco  . Never Used    Family History  Problem Relation Age of Onset  . Stroke Father 25    died  . Lung cancer Brother 73    died  . Rheum arthritis Sister 80  . Cancer Sister     breast  . Cancer Daughter     colon  . Colon cancer Maternal Aunt   . Esophageal cancer Neg Hx   . Rectal cancer Neg Hx   . Stomach cancer Neg Hx   . Cancer Mother     uterine  . Cancer Sister     colon    Current Outpatient Prescriptions on File Prior to Visit  Medication Sig Dispense Refill  . adalimumab (HUMIRA) 40 MG/0.8ML injection Inject 40 mg into the skin every 14 (fourteen) days.      Marland Kitchen aspirin EC 81 MG tablet Take 81 mg by mouth daily.      Marland Kitchen azelastine (ASTELIN) 137 MCG/SPRAY nasal spray Place 2 sprays into both nostrils daily. Use in each nostril as directed      . beclomethasone (QVAR) 40 MCG/ACT inhaler Inhale 2 puffs into the lungs 2 (two) times daily.  1 Inhaler  11  . cetirizine (ZYRTEC) 10 MG tablet TAKE 1 TABLET (10 MG TOTAL) BY  MOUTH DAILY.  31 tablet  5  . citalopram (CELEXA) 40 MG tablet TAKE 1 TABLET BY MOUTH DAILY  30 tablet  11  . clonazePAM (KLONOPIN) 1 MG tablet Take 1 tablet (1 mg total) by mouth 2 (two) times daily as needed for anxiety.  60 tablet  0  . clonazePAM (KLONOPIN) 1 MG tablet Take 1 tablet (1 mg total) by mouth 2 (two) times daily as needed for anxiety. Do not fill prescription until 30 days after prescription date.  60 tablet  0  . clonazePAM (KLONOPIN) 1 MG tablet Take 1 tablet (1 mg total) by mouth 2 (two) times daily as needed for anxiety. Do not fill until 60 days after prescription date.  60 tablet  0  . cyclobenzaprine (FLEXERIL) 5 MG tablet Take 5 mg by mouth 3 (three) times daily as needed for muscle spasms.      . Dapagliflozin Propanediol 5 MG TABS Take 5 mg by mouth daily.  30 tablet  2  . diclofenac sodium (VOLTAREN) 1 % GEL Apply 2 g topically 4 (four) times daily as needed (for pain).      Marland Kitchen  docusate sodium (COLACE) 50 MG capsule Take 50 mg by mouth at bedtime.      Marland Kitchen esomeprazole (NEXIUM) 40 MG capsule Take 1 capsule (40 mg total) by mouth daily.  30 capsule  3  . gabapentin (NEURONTIN) 300 MG capsule TAKE 1 CAPSULE (300 MG TOTAL) BY MOUTH AT BEDTIME.  30 capsule  3  . glucose blood test strip Use as instructed  100 each  12  . ibuprofen (ADVIL,MOTRIN) 600 MG tablet Take 1 tablet (600 mg total) by mouth every 8 (eight) hours as needed.  30 tablet  0  . levocetirizine (XYZAL) 5 MG tablet TAKE 1 TABLET IN THE EVENING  30 tablet  2  . losartan (COZAAR) 50 MG tablet TAKE 1 TABLET BY MOUTH EVERY DAY  90 tablet  0  . metFORMIN (GLUCOPHAGE) 1000 MG tablet TAKE 1 TABLET (1,000 MG TOTAL) BY MOUTH 2 (TWO) TIMES DAILY WITH A MEAL.  60 tablet  3  . metoprolol tartrate (LOPRESSOR) 25 MG tablet Take 25 mg by mouth 2 (two) times daily.      . mometasone (NASONEX) 50 MCG/ACT nasal spray Place 2 sprays into the nose daily.  17 g  2  . nitroGLYCERIN (NITROSTAT) 0.4 MG SL tablet Place 0.4 mg under the  tongue every 5 (five) minutes as needed.        Marland Kitchen oxyCODONE-acetaminophen (PERCOCET) 10-325 MG per tablet Take 1 tablet by mouth every 6 (six) hours as needed for pain. Do not fill until 30 days after date printed.  120 tablet  0  . oxyCODONE-acetaminophen (PERCOCET) 10-325 MG per tablet Take 1 tablet by mouth every 6 (six) hours as needed for pain. Do not fill until 60 days from date it was printed.  120 tablet  0  . oxyCODONE-acetaminophen (PERCOCET) 10-325 MG per tablet Take 1 tablet by mouth every 6 (six) hours as needed for pain.  120 tablet  0  . predniSONE (DELTASONE) 50 MG tablet Take 1 tablet (50 mg total) by mouth daily with breakfast.  7 tablet  0  . PROAIR HFA 108 (90 BASE) MCG/ACT inhaler INHALE 2 PUFFS INTO THE LUNGS DAILY AS NEEDED FOR WHEEZING OR SHORTNESS OF BREATH.  17 each  1  . propranolol (INDERAL) 40 MG tablet Take 1 tablet (40 mg total) by mouth 2 (two) times daily.  60 tablet  1  . rosuvastatin (CRESTOR) 10 MG tablet Take 1 tablet (10 mg total) by mouth daily.  30 tablet  3  . traZODone (DESYREL) 50 MG tablet Take 50 mg by mouth at bedtime as needed for sleep.       Marland Kitchen triamcinolone cream (KENALOG) 0.1 % Apply 1 application topically daily as needed.  30 g  0   No current facility-administered medications on file prior to visit.    ROS: Per HPI.  All other systems reviewed and are negative.   Physical Exam Filed Vitals:   01/28/14 1407  BP: 117/60  Pulse: 78  Temp: 99.9 F (37.7 C)    Physical Examination: General appearance - alert, well appearing, and in no distress Chest - clear to auscultation, no wheezes, rales or rhonchi, symmetric air entry Heart - normal rate and regular rhythm Abdomen - Soft, TTP suprapubic and R CVA, otherwise no TTP and NABS Extremities - peripheral pulses normal, no pedal edema, no clubbing or cyanosis    Chemistry      Component Value Date/Time   NA 136 10/29/2013 0910   K 4.1 10/29/2013 0910  CL 100 10/29/2013 0910   CO2 29  10/29/2013 0910   BUN 11 10/29/2013 0910   CREATININE 0.59 10/29/2013 0910   CREATININE 0.7 01/20/2012 1552      Component Value Date/Time   CALCIUM 9.3 10/29/2013 0910   ALKPHOS 69 10/29/2013 0910   AST 13 10/29/2013 0910   ALT 19 10/29/2013 0910   BILITOT 0.4 10/29/2013 0910      Lab Results  Component Value Date   WBC 9.2 11/18/2012   HGB 12.0 11/18/2012   HCT 36.1 11/18/2012   MCV 91.2 11/18/2012   PLT 328 11/18/2012   Lab Results  Component Value Date   TSH 2.194 05/12/2013   Lab Results  Component Value Date   HGBA1C 7.2 01/25/2014

## 2014-02-01 ENCOUNTER — Other Ambulatory Visit: Payer: Self-pay | Admitting: Family Medicine

## 2014-02-24 ENCOUNTER — Ambulatory Visit: Payer: Medicaid Other | Admitting: Family Medicine

## 2014-02-28 ENCOUNTER — Other Ambulatory Visit: Payer: Self-pay | Admitting: Family Medicine

## 2014-03-02 ENCOUNTER — Ambulatory Visit (INDEPENDENT_AMBULATORY_CARE_PROVIDER_SITE_OTHER): Payer: Medicaid Other | Admitting: Family Medicine

## 2014-03-02 ENCOUNTER — Encounter: Payer: Self-pay | Admitting: Family Medicine

## 2014-03-02 VITALS — BP 130/78 | HR 88 | Temp 98.3°F | Ht 63.0 in | Wt 165.0 lb

## 2014-03-02 DIAGNOSIS — Z23 Encounter for immunization: Secondary | ICD-10-CM

## 2014-03-02 DIAGNOSIS — R634 Abnormal weight loss: Secondary | ICD-10-CM | POA: Diagnosis present

## 2014-03-02 DIAGNOSIS — R259 Unspecified abnormal involuntary movements: Secondary | ICD-10-CM

## 2014-03-02 DIAGNOSIS — R918 Other nonspecific abnormal finding of lung field: Secondary | ICD-10-CM

## 2014-03-02 DIAGNOSIS — G252 Other specified forms of tremor: Secondary | ICD-10-CM

## 2014-03-02 LAB — CBC
HEMATOCRIT: 36.4 % (ref 36.0–46.0)
HEMOGLOBIN: 11.9 g/dL — AB (ref 12.0–15.0)
MCH: 30.1 pg (ref 26.0–34.0)
MCHC: 32.7 g/dL (ref 30.0–36.0)
MCV: 91.9 fL (ref 78.0–100.0)
Platelets: 291 10*3/uL (ref 150–400)
RBC: 3.96 MIL/uL (ref 3.87–5.11)
RDW: 14.1 % (ref 11.5–15.5)
WBC: 7.1 10*3/uL (ref 4.0–10.5)

## 2014-03-02 NOTE — Patient Instructions (Signed)
Nice to see you. We will monitor your lung nodules with a CT scan of your chest in one year.  We will check some blood work today to evaluate your weight loss.

## 2014-03-02 NOTE — Progress Notes (Signed)
Patient ID: Michelle [name], female   DOB: Jun 01, 1951, 63 y.o.   MRN: [id number]  Michelle Rumps, MD Phone: [phone]  Michelle [name] is a 63 y.o. female who presents today for f/u.  Tremors: patient notes this has improved greatly on propranolol. She has no tremor with holding her hand out at this time. She does not mild tremor when she hold a plate, though she is able to keep food on the plate. Gait has been normal.  Pulmonary nodules: notes she had a CT chest at [facility] for monitoring for methotrexate toxicity. She was told there were 2 nodules. The study results are found in care everywhere. She has not heard anything from her pulmonologist at this time. Smoked 2 PPD 20 years ago for 5 years.   Weight loss: notes has lost 10 lbs since November 2014. She is not trying to lose weight. She has been having difficulty swallowing her solid food recently for which she has already been referred to GI and sees them in October. Last colonoscopy in 2012 was normal. She denies rectal bleeding. She notes she had a complete hysterectomy many years ago for bleeding, though not cancer. TSH was normal when checked last year. Reports unlikely to have HIV as has been married for 40+ years.   Patient is a former smoker.   ROS: Per HPI   Physical Exam Filed Vitals:   03/02/14 1016  BP: 130/78  Pulse: 88  Temp: 98.3 F (36.8 C)    Gen: Well NAD HEENT: PERRL,  MMM Lungs: CTABL Nl WOB Heart: RRR no MRG Neuro: 5/5 strength in bilateral biceps, triceps, grip, quads, hamstrings, plantar and dorsiflexion, sensation to light touch intact in bilateral UE and LE, normal gait, 2+ patellar reflexes Exts: Non edematous BL  LE, warm and well perfused.    Assessment/Plan: Please see individual problem list.  # Healthcare maintenance: needs PNA vaccine at next visit  Michelle Rumps, MD [place] PGY-3

## 2014-03-02 NOTE — Assessment & Plan Note (Addendum)
Patient with unintentional weight loss of 10 lbs. Notes that she continues to have a sensation of food getting stuck briefly on swallowing. This is likely to be the cause of her weight loss as she has had decreased intake with this. Her weight has been stable over the past several months. She does not need a pap smear as she had a hysterectomy for reportedly benign reasons. She needs to keep her follow-up with GI for further evaluation. In the meantime we will check an HIV, CBC, and TSH to look for other causes. F/u in 6 weeks.

## 2014-03-02 NOTE — Assessment & Plan Note (Signed)
Sub 5 mm similar sized nodules noted on recent CT scan. Given small similar size and multiple nodules this is less likely to be a malignant process. Will need to monitor in one year with repeat CT chest to monitor for progression.

## 2014-03-02 NOTE — Assessment & Plan Note (Signed)
Improved with propranolol. Will continue this medication at this time. F/u in 6 months for this issue.

## 2014-03-03 ENCOUNTER — Encounter: Payer: Self-pay | Admitting: Family Medicine

## 2014-03-03 LAB — HIV ANTIBODY (ROUTINE TESTING W REFLEX): HIV 1&2 Ab, 4th Generation: NONREACTIVE

## 2014-03-03 LAB — TSH: TSH: 1.835 u[IU]/mL (ref 0.350–4.500)

## 2014-03-11 ENCOUNTER — Other Ambulatory Visit: Payer: Self-pay | Admitting: Family Medicine

## 2014-03-22 ENCOUNTER — Other Ambulatory Visit: Payer: Self-pay | Admitting: Family Medicine

## 2014-03-23 ENCOUNTER — Encounter: Payer: Self-pay | Admitting: Family Medicine

## 2014-03-23 ENCOUNTER — Ambulatory Visit (INDEPENDENT_AMBULATORY_CARE_PROVIDER_SITE_OTHER): Payer: Medicaid Other | Admitting: Family Medicine

## 2014-03-23 VITALS — BP 118/74 | HR 69 | Temp 98.1°F | Ht 63.0 in | Wt 164.0 lb

## 2014-03-23 DIAGNOSIS — H66009 Acute suppurative otitis media without spontaneous rupture of ear drum, unspecified ear: Secondary | ICD-10-CM

## 2014-03-23 DIAGNOSIS — H6590 Unspecified nonsuppurative otitis media, unspecified ear: Secondary | ICD-10-CM

## 2014-03-23 DIAGNOSIS — H66001 Acute suppurative otitis media without spontaneous rupture of ear drum, right ear: Secondary | ICD-10-CM

## 2014-03-23 MED ORDER — AMOXICILLIN-POT CLAVULANATE 875-125 MG PO TABS
1.0000 | ORAL_TABLET | Freq: Two times a day (BID) | ORAL | Status: DC
Start: 2014-03-23 — End: 2014-04-06

## 2014-03-23 NOTE — Patient Instructions (Signed)
Michelle [name] it was great to meet you today!  I am sorry that you are not feeling well Let's do augmentin for 10 days for your ear infection If you continue to have problems please give [place] a call  Please return to clinic if symptoms do not improve or worsen Feel better soon Bernadene Bell, MD

## 2014-03-23 NOTE — Assessment & Plan Note (Signed)
Acute onset with fever Given hx of chronic sinusitis and nasal septal surgery will tx with abx for now Augmentin X10 days May eventually need referral back to ENT (last saw 6 months ago) Pt to f/up if no improvement

## 2014-03-23 NOTE — Progress Notes (Signed)
Patient ID: ARIEONNA MEDINE, female   DOB: 26-Nov-1950, 63 y.o.   MRN: [id number]   [facility] Bernadene Bell, MD Phone: [phone]  Subjective:  Ms Mariner is a 63 y.o F who presents today to [facility]   # Jaw pain - had neck pain left side initially on Sunday, no inciting trauma - pt reporting jaw pain which woke her up yesterday morning - pt also having associated ear discomfort on right side; no drainage from ear; no sore throat, no rhinorrhea  - law saw Dr. Caryl Bis on 03/02/14 did not have issues at that time - temp usually runs on the low side 96 -> 100 yesterday (has been taking aleve) -denies palpitations or chest discomfort, no assc SOB (although does have hx of COPD)  All relevant systems were reviewed and were negative unless otherwise noted in the HPI  Past Medical History Reviewed problem list.  Medications- reviewed and updated Current Outpatient Prescriptions  Medication Sig Dispense Refill  . adalimumab (HUMIRA) 40 MG/0.8ML injection Inject 40 mg into the skin every 14 (fourteen) days.      Marland Kitchen amoxicillin-clavulanate (AUGMENTIN) 875-125 MG per tablet Take 1 tablet by mouth 2 (two) times daily.  20 tablet  0  . aspirin EC 81 MG tablet Take 81 mg by mouth daily.      Marland Kitchen azelastine (ASTELIN) 137 MCG/SPRAY nasal spray Place 2 sprays into both nostrils daily. Use in each nostril as directed      . beclomethasone (QVAR) 40 MCG/ACT inhaler Inhale 2 puffs into the lungs 2 (two) times daily.  1 Inhaler  11  . cetirizine (ZYRTEC) 10 MG tablet TAKE 1 TABLET (10 MG TOTAL) BY MOUTH DAILY.  31 tablet  5  . citalopram (CELEXA) 40 MG tablet TAKE 1 TABLET BY MOUTH DAILY  30 tablet  11  . clonazePAM (KLONOPIN) 1 MG tablet Take 1 tablet (1 mg total) by mouth 2 (two) times daily as needed for anxiety.  60 tablet  0  . clonazePAM (KLONOPIN) 1 MG tablet Take 1 tablet (1 mg total) by mouth 2 (two) times daily as needed for anxiety. Do not fill prescription until 30 days after  prescription date.  60 tablet  0  . clonazePAM (KLONOPIN) 1 MG tablet Take 1 tablet (1 mg total) by mouth 2 (two) times daily as needed for anxiety. Do not fill until 60 days after prescription date.  60 tablet  0  . CRESTOR 10 MG tablet TAKE 1 TABLET BY MOUTH DAILY  30 tablet  3  . cyclobenzaprine (FLEXERIL) 5 MG tablet Take 5 mg by mouth 3 (three) times daily as needed for muscle spasms.      . Dapagliflozin Propanediol 5 MG TABS Take 5 mg by mouth daily.  30 tablet  2  . diclofenac sodium (VOLTAREN) 1 % GEL Apply 2 g topically 4 (four) times daily as needed (for pain).      Marland Kitchen docusate sodium (COLACE) 50 MG capsule Take 50 mg by mouth at bedtime.      . gabapentin (NEURONTIN) 300 MG capsule TAKE ONE CAPSULE BY MOUTH AT BEDTIME  90 capsule  3  . glucose blood test strip Use as instructed  100 each  12  . ibuprofen (ADVIL,MOTRIN) 600 MG tablet Take 1 tablet (600 mg total) by mouth every 8 (eight) hours as needed.  30 tablet  0  . levocetirizine (XYZAL) 5 MG tablet TAKE 1 TABLET IN THE EVENING  30 tablet  2  . losartan (COZAAR) 50 MG tablet TAKE 1 TABLET BY MOUTH EVERY DAY  90 tablet  0  . metFORMIN (GLUCOPHAGE) 1000 MG tablet TAKE 1 TABLET (1,000 MG TOTAL) BY MOUTH 2 (TWO) TIMES DAILY WITH A MEAL.  60 tablet  3  . metoprolol tartrate (LOPRESSOR) 25 MG tablet Take 25 mg by mouth 2 (two) times daily.      . mometasone (NASONEX) 50 MCG/ACT nasal spray Place 2 sprays into the nose daily.  17 g  2  . NEXIUM 40 MG capsule TAKE 1 CAPSULE BY MOUTH DAILY  30 capsule  3  . nitroGLYCERIN (NITROSTAT) 0.4 MG SL tablet Place 0.4 mg under the tongue every 5 (five) minutes as needed.        Marland Kitchen oxyCODONE-acetaminophen (PERCOCET) 10-325 MG per tablet Take 1 tablet by mouth every 6 (six) hours as needed for pain. Do not fill until 30 days after date printed.  120 tablet  0  . oxyCODONE-acetaminophen (PERCOCET) 10-325 MG per tablet Take 1 tablet by mouth every 6 (six) hours as needed for pain. Do not fill until 60 days  from date it was printed.  120 tablet  0  . oxyCODONE-acetaminophen (PERCOCET) 10-325 MG per tablet Take 1 tablet by mouth every 6 (six) hours as needed for pain.  120 tablet  0  . predniSONE (DELTASONE) 50 MG tablet Take 1 tablet (50 mg total) by mouth daily with breakfast.  7 tablet  0  . PROAIR HFA 108 (90 BASE) MCG/ACT inhaler INHALE 2 PUFFS INTO THE LUNGS DAILY AS NEEDED FOR WHEEZING OR SHORTNESS OF BREATH.  17 each  1  . propranolol (INDERAL) 40 MG tablet TAKE 1 TABLET (40 MG TOTAL) BY MOUTH 2 (TWO) TIMES DAILY.  60 tablet  3  . traZODone (DESYREL) 50 MG tablet Take 50 mg by mouth at bedtime as needed for sleep.       Marland Kitchen triamcinolone cream (KENALOG) 0.1 % Apply 1 application topically daily as needed.  30 g  0   No current facility-administered medications for this visit.   Chief complaint-noted No additions to family history Social history- patient is a former smoker  Objective: BP 118/74  Pulse 69  Temp(Src) 98.1 F (36.7 C) (Oral)  Ht 5\' 3"  (1.6 m)  Wt 164 lb (74.39 kg)  BMI 29.06 kg/m2 Gen: NAD, alert, cooperative with exam HEENT: NCAT, EOMI, PERRL but pinpoint; puckered right sided TM; non erythematous throat Frontal sinuses TTP Neck: FROM, supple; tender to palpation along SCM Neuro: Alert and oriented, No gross deficits Skin: no rashes no lesions  Assessment/Plan: See problem based a/p

## 2014-04-06 ENCOUNTER — Ambulatory Visit (INDEPENDENT_AMBULATORY_CARE_PROVIDER_SITE_OTHER): Payer: Medicaid Other | Admitting: Gastroenterology

## 2014-04-06 ENCOUNTER — Encounter: Payer: Self-pay | Admitting: Gastroenterology

## 2014-04-06 VITALS — BP 96/66 | HR 72 | Ht 62.6 in | Wt 169.2 lb

## 2014-04-06 DIAGNOSIS — R1310 Dysphagia, unspecified: Secondary | ICD-10-CM

## 2014-04-06 DIAGNOSIS — Z800 Family history of malignant neoplasm of digestive organs: Secondary | ICD-10-CM

## 2014-04-06 DIAGNOSIS — R194 Change in bowel habit: Secondary | ICD-10-CM

## 2014-04-06 MED ORDER — MOVIPREP 100 G PO SOLR: 1.0000 | Freq: Once | ORAL | Status: DC

## 2014-04-06 NOTE — Progress Notes (Signed)
Review of pertinent gastrointestinal problems:  1. EGD 2006 with Dr. Cristina Gong: This was done for Hemoccult-positive stool. He wrote about bile reflux into stomach and esophagus, possible minimal Barrett's esophagus. Biopsies of the GE junction showed mild inflammation only without any intestinal metaplasia.  2. Elevated risk for colon cancer (FH +): Colonoscopy January 2012 findings diverticulosis but was otherwise normal. Dr. Cristina Gong recommended she have a repeat colonoscopy at 10 year interval for her routine risk screening. Shortly after that her daughter was diagnosed with colon cancer which puts her at increased risk and recall was adjusted to 5 year interval.  3. dysphagia like symptoms. April 2013 EGD by Dr. Ardis Hughs. Mild gastritis, H. pylori negative on biopsy, retained solid and liquid food without anatomic gastric outlet obstruction, this suggested gastroparesis. DM and daily narcotic medicines likely contribute,  4. abd pain 7/13: cbc, cmet, amylas, lipase were normal: CT scan abd pelvis with IV and PO contrast   HPI: This is a  pleasant 63 year old woman whom I last saw about 2 years ago. She is with her granddaughter today.  Having a "lot of problems swallowing."  Has been going on for a year.  Getting worse.  Feels like food hangs in mid esophagus.Will have to vomit at times.  She would like another colonoscopy.  She is worried because her daughter had colon cancer.  Daughter found to have kidney masses.  No overt bleeding.  Has constipation (likely narcotic related) and this is new for her. She does periodically take Percocets 3-4 times per week.,   She had nodules in lungs, followed by pulmonologist at [facility] a1c 7.6 recently.  Takes percocet 3-4 times per week.  Has been on antiobitics She has lost 8 pounds since her last visit 2 years ago, same scale use to compare    Labs 01/2014 Hb 11.9, CBC otherwise normal, HIV neg, TSH normal   Past Medical History   Diagnosis Date  . RA (rheumatoid arthritis)     CCP positive followed by [facility]  . HLD (hyperlipidemia)   . Diabetes mellitus   . Asthma   . Allergy   . Anxiety   . Cataract     beginning on left eye  . GERD (gastroesophageal reflux disease)   . Osteoporosis   . Diverticulosis   . Lung nodules     Past Surgical History  Procedure Laterality Date  . Appendectomy    . Cholecystectomy    . Abdominal hysterectomy    . Carpal tunnel release    . Knee surgery      x2  . Hernia repair    . Shoulder surgery      x2  . Right foot bone spurs removed      Current Outpatient Prescriptions  Medication Sig Dispense Refill  . adalimumab (HUMIRA) 40 MG/0.8ML injection Inject 40 mg into the skin every 14 (fourteen) days.      Marland Kitchen amoxicillin-clavulanate (AUGMENTIN) 875-125 MG per tablet Take 1 tablet by mouth 2 (two) times daily.  20 tablet  0  . aspirin EC 81 MG tablet Take 81 mg by mouth daily.      Marland Kitchen azelastine (ASTELIN) 137 MCG/SPRAY nasal spray Place 2 sprays into both nostrils daily. Use in each nostril as directed      . beclomethasone (QVAR) 40 MCG/ACT inhaler Inhale 2 puffs into the lungs 2 (two) times daily.  1 Inhaler  11  . cetirizine (ZYRTEC) 10 MG tablet TAKE 1 TABLET (10 MG  TOTAL) BY MOUTH DAILY.  31 tablet  5  . citalopram (CELEXA) 40 MG tablet TAKE 1 TABLET BY MOUTH DAILY  30 tablet  11  . clonazePAM (KLONOPIN) 1 MG tablet Take 1 tablet (1 mg total) by mouth 2 (two) times daily as needed for anxiety.  60 tablet  0  . CRESTOR 10 MG tablet TAKE 1 TABLET BY MOUTH DAILY  30 tablet  3  . cyclobenzaprine (FLEXERIL) 5 MG tablet Take 5 mg by mouth 3 (three) times daily as needed for muscle spasms.      . Dapagliflozin Propanediol 5 MG TABS Take 5 mg by mouth daily.  30 tablet  2  . diclofenac sodium (VOLTAREN) 1 % GEL Apply 2 g topically 4 (four) times daily as needed (for pain).      Marland Kitchen docusate sodium (COLACE) 50 MG capsule Take 50 mg by mouth at bedtime.      . gabapentin  (NEURONTIN) 300 MG capsule TAKE ONE CAPSULE BY MOUTH AT BEDTIME  90 capsule  3  . glucose blood test strip Use as instructed  100 each  12  . ibuprofen (ADVIL,MOTRIN) 600 MG tablet Take 1 tablet (600 mg total) by mouth every 8 (eight) hours as needed.  30 tablet  0  . levocetirizine (XYZAL) 5 MG tablet TAKE 1 TABLET IN THE EVENING  30 tablet  2  . losartan (COZAAR) 50 MG tablet TAKE 1 TABLET BY MOUTH EVERY DAY  90 tablet  0  . metFORMIN (GLUCOPHAGE) 1000 MG tablet TAKE 1 TABLET (1,000 MG TOTAL) BY MOUTH 2 (TWO) TIMES DAILY WITH A MEAL.  60 tablet  3  . metoprolol tartrate (LOPRESSOR) 25 MG tablet Take 25 mg by mouth 2 (two) times daily.      . mometasone (NASONEX) 50 MCG/ACT nasal spray Place 2 sprays into the nose daily.  17 g  2  . NEXIUM 40 MG capsule TAKE 1 CAPSULE BY MOUTH DAILY  30 capsule  3  . nitroGLYCERIN (NITROSTAT) 0.4 MG SL tablet Place 0.4 mg under the tongue every 5 (five) minutes as needed.        Marland Kitchen oxyCODONE-acetaminophen (PERCOCET) 10-325 MG per tablet Take 1 tablet by mouth every 6 (six) hours as needed for pain. Do not fill until 30 days after date printed.  120 tablet  0  . oxyCODONE-acetaminophen (PERCOCET) 10-325 MG per tablet Take 1 tablet by mouth every 6 (six) hours as needed for pain. Do not fill until 60 days from date it was printed.  120 tablet  0  . oxyCODONE-acetaminophen (PERCOCET) 10-325 MG per tablet Take 1 tablet by mouth every 6 (six) hours as needed for pain.  120 tablet  0  . predniSONE (DELTASONE) 50 MG tablet Take 1 tablet (50 mg total) by mouth daily with breakfast.  7 tablet  0  . PROAIR HFA 108 (90 BASE) MCG/ACT inhaler INHALE 2 PUFFS INTO THE LUNGS DAILY AS NEEDED FOR WHEEZING OR SHORTNESS OF BREATH.  17 each  1  . propranolol (INDERAL) 40 MG tablet TAKE 1 TABLET (40 MG TOTAL) BY MOUTH 2 (TWO) TIMES DAILY.  60 tablet  3  . traZODone (DESYREL) 50 MG tablet Take 50 mg by mouth at bedtime as needed for sleep.       Marland Kitchen triamcinolone cream (KENALOG) 0.1 % Apply  1 application topically daily as needed.  30 g  0   No current facility-administered medications for this visit.    Allergies as of 04/06/2014 -  Review Complete 04/06/2014  Allergen Reaction Noted  . Sulfonamide derivatives Hives and Itching   . Sulfa antibiotics  08/17/2013    Family History  Problem Relation Age of Onset  . Stroke Father 75    died  . Lung cancer Brother 54    died  . Rheum arthritis Sister 70  . Colon cancer Daughter   . Colon cancer Maternal Aunt   . Esophageal cancer Neg Hx   . Rectal cancer Neg Hx   . Stomach cancer Neg Hx   . COPD Sister   . Cancer Sister     in nose    History   Social History  . Marital Status: Married    Spouse Name: N/A    Number of Children: 3  . Years of Education: N/A   Occupational History  . disabled   .     Social History Main Topics  . Smoking status: Former Smoker -- 1.50 packs/day for 5 years    Types: Cigarettes    Quit date: 05/31/1994  . Smokeless tobacco: Never Used  . Alcohol Use: No  . Drug Use: No  . Sexual Activity: Not on file   Other Topics Concern  . Not on file   Social History Narrative   Pt is on [employer] [place]   Lots of family stressors including grandaughter who lives with her            Physical Exam: Ht 5' 2.6" (1.59 m)  Wt 169 lb 4 oz (76.771 kg)  BMI 30.37 kg/m2 Constitutional: generally well-appearing Psychiatric: alert and oriented x3 Abdomen: soft, nontender, nondistended, no obvious ascites, no peritoneal signs, normal bowel sounds     Assessment and plan: 63 y.o. female with weight loss, intermittent solid food and pill associated dysphasia, change in bowels, family history of colon cancer  Percocet is probably playing a role in her constipation however she has been on it for years and has generally had looser stools than usual. This change in her bowel, given the family history of colon cancer in her daughter should be explored with colonoscopy to rule out  neoplastic causes. She has intermittent solid food dysphagia. She has had this in the past however there is documented weight loss now like to repeat EGD at the same time as colonoscopy.**

## 2014-04-06 NOTE — Patient Instructions (Signed)
You will be set up for an upper endoscopy for dysphagia. You will be set up for a colonoscopy for change in bowels, constipation. Try one dose of miralax daily for your constipation.

## 2014-04-19 ENCOUNTER — Encounter: Payer: Self-pay | Admitting: Family Medicine

## 2014-04-19 ENCOUNTER — Ambulatory Visit (INDEPENDENT_AMBULATORY_CARE_PROVIDER_SITE_OTHER): Payer: Medicaid Other | Admitting: Family Medicine

## 2014-04-19 VITALS — BP 133/81 | HR 66 | Temp 98.0°F | Wt 167.0 lb

## 2014-04-19 DIAGNOSIS — F329 Major depressive disorder, single episode, unspecified: Secondary | ICD-10-CM

## 2014-04-19 DIAGNOSIS — E119 Type 2 diabetes mellitus without complications: Secondary | ICD-10-CM

## 2014-04-19 DIAGNOSIS — R350 Frequency of micturition: Secondary | ICD-10-CM

## 2014-04-19 DIAGNOSIS — F419 Anxiety disorder, unspecified: Secondary | ICD-10-CM

## 2014-04-19 DIAGNOSIS — F418 Other specified anxiety disorders: Secondary | ICD-10-CM

## 2014-04-19 DIAGNOSIS — J309 Allergic rhinitis, unspecified: Secondary | ICD-10-CM

## 2014-04-19 DIAGNOSIS — M069 Rheumatoid arthritis, unspecified: Secondary | ICD-10-CM

## 2014-04-19 DIAGNOSIS — N390 Urinary tract infection, site not specified: Secondary | ICD-10-CM | POA: Insufficient documentation

## 2014-04-19 DIAGNOSIS — N3001 Acute cystitis with hematuria: Secondary | ICD-10-CM

## 2014-04-19 LAB — POCT UA - MICROSCOPIC ONLY

## 2014-04-19 LAB — POCT URINALYSIS DIPSTICK
BILIRUBIN UA: NEGATIVE
GLUCOSE UA: NEGATIVE
KETONES UA: NEGATIVE
Nitrite, UA: NEGATIVE
Protein, UA: NEGATIVE
SPEC GRAV UA: 1.01
Urobilinogen, UA: 0.2
pH, UA: 5.5

## 2014-04-19 LAB — POCT GLYCOSYLATED HEMOGLOBIN (HGB A1C): Hemoglobin A1C: 7.1

## 2014-04-19 MED ORDER — METFORMIN HCL 1000 MG PO TABS: ORAL_TABLET | ORAL | Status: DC

## 2014-04-19 MED ORDER — ALBUTEROL SULFATE HFA 108 (90 BASE) MCG/ACT IN AERS: INHALATION_SPRAY | RESPIRATORY_TRACT | Status: DC

## 2014-04-19 MED ORDER — OXYCODONE-ACETAMINOPHEN 10-325 MG PO TABS: 1.0000 | ORAL_TABLET | Freq: Four times a day (QID) | ORAL | Status: DC | PRN

## 2014-04-19 MED ORDER — CLONAZEPAM 1 MG PO TABS: 1.0000 mg | ORAL_TABLET | Freq: Two times a day (BID) | ORAL | Status: DC | PRN

## 2014-04-19 MED ORDER — LOSARTAN POTASSIUM 50 MG PO TABS: ORAL_TABLET | ORAL | Status: DC

## 2014-04-19 MED ORDER — CEPHALEXIN 500 MG PO CAPS: 500.0000 mg | ORAL_CAPSULE | Freq: Three times a day (TID) | ORAL | Status: DC

## 2014-04-19 NOTE — Patient Instructions (Addendum)
Nice to see you. Please continue you nasonex and xyzal for your allergies.  Your A1c was 7.1. Please continue your metformin.  You have a UTI. We will treat you with keflex. If you develop fever, abdominal pain, nausea, or vomiting please seek medical attention.

## 2014-04-19 NOTE — Assessment & Plan Note (Signed)
At goal. Continue current regimen. F/u in 3 months.

## 2014-04-19 NOTE — Assessment & Plan Note (Addendum)
Refill given on klonopin. Patient to follow-up in one month for this issue.

## 2014-04-19 NOTE — Assessment & Plan Note (Signed)
Currently stable. Refill given for one month percocet. F/u in one month for this issue.

## 2014-04-19 NOTE — Assessment & Plan Note (Signed)
Patient with symptoms and UA evidence of UTI. Will treat with keflex. UCx sent. Given return precautions.

## 2014-04-19 NOTE — Assessment & Plan Note (Signed)
Mildly worse. Will continue to monitor on current medication regimen. F/u if does not improve.

## 2014-04-19 NOTE — Progress Notes (Addendum)
Patient ID: Michelle [name], female   DOB: 12-27-1950, 63 y.o.   MRN: [id number]  Tommi Rumps, MD Phone: [phone]  Michelle [name] is a 63 y.o. female who presents today for f/u.  DIABETES Disease Monitoring: Blood Sugar ranges-100's Polyuria/phagia/dipsia- notes polyuria       Medications: Compliance- taking metformin Hypoglycemic symptoms- no  Urinary frequency: patient notes the past couple of days has had this. Started Saturday night. Notes sensation of needing to urinate and then not much coming out. Endorses dysuria. Some urgency. Noted blood once on TP when wiping. Thinks she has a UTI. No fevers. No abdominal pain.   Allergies: notes throat has been sore the past couple of days. Left ear hurt. Notes some post nasal drip. Mild cough. No congestion. Had OM in September and finished antibiotics for this.   Anxiety: stable on current klonopin dosing. Needs refills on klonopin.   Rheumatoid arthritis: pain is stable on current percocet dosing. Notes if she has a flare she takes it every 6 hours. Needs a refill.    Patient is a former smoker.   ROS: Per HPI   Physical Exam Filed Vitals:   04/19/14 0917  BP: 133/81  Pulse: 66  Temp: 98 F (36.7 C)    Gen: Well NAD HEENT: PERRL,  MMM, left TM with clear fluid, no opacity to fluid, right TM normal, no OP erythema, no cervical LAD Lungs: CTABL Nl WOB Heart: RRR no MRG Abd: soft, NT, ND Exts: Non edematous BL  LE, warm and well perfused.    Assessment/Plan: Please see individual problem list.  # Healthcare maintenance: needs PNA vaccine   Tommi Rumps, MD Michelle [name] PGY-3

## 2014-04-19 NOTE — Addendum Note (Signed)
Addended by: Christen Bame D on: 04/19/2014 01:41 PM   Modules accepted: Orders

## 2014-04-20 ENCOUNTER — Other Ambulatory Visit: Payer: Self-pay | Admitting: Family Medicine

## 2014-04-22 LAB — URINE CULTURE: Colony Count: >=100000

## 2014-05-03 ENCOUNTER — Encounter: Payer: Self-pay | Admitting: Gastroenterology

## 2014-05-03 ENCOUNTER — Ambulatory Visit (AMBULATORY_SURGERY_CENTER): Payer: Medicaid Other | Admitting: Gastroenterology

## 2014-05-03 VITALS — BP 119/65 | HR 58 | Temp 98.4°F | Resp 18 | Ht 62.6 in | Wt 169.0 lb

## 2014-05-03 DIAGNOSIS — K2990 Gastroduodenitis, unspecified, without bleeding: Secondary | ICD-10-CM

## 2014-05-03 DIAGNOSIS — R1310 Dysphagia, unspecified: Secondary | ICD-10-CM

## 2014-05-03 DIAGNOSIS — D124 Benign neoplasm of descending colon: Secondary | ICD-10-CM

## 2014-05-03 DIAGNOSIS — D126 Benign neoplasm of colon, unspecified: Secondary | ICD-10-CM

## 2014-05-03 DIAGNOSIS — K2970 Gastritis, unspecified, without bleeding: Secondary | ICD-10-CM

## 2014-05-03 DIAGNOSIS — K2950 Unspecified chronic gastritis without bleeding: Secondary | ICD-10-CM

## 2014-05-03 DIAGNOSIS — R194 Change in bowel habit: Secondary | ICD-10-CM

## 2014-05-03 LAB — GLUCOSE, CAPILLARY
GLUCOSE-CAPILLARY: 95 mg/dL (ref 70–99)
Glucose-Capillary: 130 mg/dL — ABNORMAL HIGH (ref 70–99)

## 2014-05-03 MED ORDER — SODIUM CHLORIDE 0.9 % IV SOLN: 500.0000 mL | INTRAVENOUS | Status: DC

## 2014-05-03 NOTE — Op Note (Signed)
[place]  [address] [place], [id number]   ENDOSCOPY PROCEDURE REPORT  PATIENT: Michelle [name], Michelle [name]  MR#: [id number] BIRTHDATE: 1951-02-04 , 70  yrs. old GENDER: female ENDOSCOPIST: Milus Banister, MD PROCEDURE DATE:  05/03/2014 PROCEDURE:  EGD w/ biopsy ASA CLASS:     Class II INDICATIONS:  dysphagia like symptoms.  April 2013 EGD by Dr. Ardis Hughs.  Mild gastritis, H.  pylori negative on biopsy, retained solid and liquid food without anatomic gastric outlet obstruction, this suggested gastroparesis.  DM and daily narcotic medicines likely contribute.  2015; increased dysphagia and now weight loss.  MEDICATIONS: Residual sedation present, Monitored anesthesia care, and Propofol 100 mg IV TOPICAL ANESTHETIC: none  DESCRIPTION OF PROCEDURE: After the risks benefits and alternatives of the procedure were thoroughly explained, informed consent was obtained.  The [id number] endoscope was introduced through the mouth and advanced to the second portion of the duodenum , Without limitations.  The instrument was slowly withdrawn as the mucosa was fully examined.  There was mild pan-gastritis.  Biopsies were taken from distal stomach and sent to pathology.  The examination was otherwise normal.  Retroflexed views revealed no abnormalities.     The scope was then withdrawn from the patient and the procedure completed.  COMPLICATIONS: There were no immediate complications.  ENDOSCOPIC IMPRESSION: There was mild pan-gastritis.  Biopsies were taken from distal stomach and sent to pathology.  The examination was otherwise normal  RECOMMENDATIONS: Await final pathology.  For now, you need to chew your food well, eat slowly and take small bites.    eSigned:  Milus Banister, MD 05/03/2014 3:28 PM    CC: Tommi Rumps, MD

## 2014-05-03 NOTE — Progress Notes (Signed)
Called to room to assist during endoscopic procedure.  Patient ID and intended procedure confirmed with present staff. Received instructions for my participation in the procedure from the performing physician.  

## 2014-05-03 NOTE — Patient Instructions (Signed)
Remember to chew your food well, eat slowly, and take small bites.  Your doctor will send you a letter in one to two weeks regarding your polyps.      YOU HAD AN ENDOSCOPIC PROCEDURE TODAY AT [facility]: Refer to the procedure report that was given to you for any specific questions about what was found during the examination.  If the procedure report does not answer your questions, please call your gastroenterologist to clarify.  If you requested that your care partner not be given the details of your procedure findings, then the procedure report has been included in a sealed envelope for you to review at your convenience later.  YOU SHOULD EXPECT: Some feelings of bloating in the abdomen. Passage of more gas than usual.  Walking can help get rid of the air that was put into your GI tract during the procedure and reduce the bloating. If you had a lower endoscopy (such as a colonoscopy or flexible sigmoidoscopy) you may notice spotting of blood in your stool or on the toilet paper. If you underwent a bowel prep for your procedure, then you may not have a normal bowel movement for a few days.  DIET: Your first meal following the procedure should be a light meal and then it is ok to progress to your normal diet.  A half-sandwich or bowl of soup is an example of a good first meal.  Heavy or fried foods are harder to digest and may make you feel nauseous or bloated.  Likewise meals heavy in dairy and vegetables can cause extra gas to form and this can also increase the bloating.  Drink plenty of fluids but you should avoid alcoholic beverages for 24 hours.  ACTIVITY: Your care partner should take you home directly after the procedure.  You should plan to take it easy, moving slowly for the rest of the day.  You can resume normal activity the day after the procedure however you should NOT DRIVE or use heavy machinery for 24 hours (because of the sedation medicines used during the test).     SYMPTOMS TO REPORT IMMEDIATELY: A gastroenterologist can be reached at any hour.  During normal business hours, 8:30 AM to 5:00 PM Monday through Friday, call [phone].  After hours and on weekends, please call the GI answering service at [phone] who will take a message and have the physician on call contact you.   Following lower endoscopy (colonoscopy or flexible sigmoidoscopy):  Excessive amounts of blood in the stool  Significant tenderness or worsening of abdominal pains  Swelling of the abdomen that is new, acute  Fever of 100F or higher  Following upper endoscopy (EGD)  Vomiting of blood or coffee ground material  New chest pain or pain under the shoulder blades  Painful or persistently difficult swallowing  New shortness of breath  Fever of 100F or higher  Black, tarry-looking stools  FOLLOW UP: If any biopsies were taken you will be contacted by phone or by letter within the next 1-3 weeks.  Call your gastroenterologist if you have not heard about the biopsies in 3 weeks.  Our staff will call the home number listed on your records the next business day following your procedure to check on you and address any questions or concerns that you may have at that time regarding the information given to you following your procedure. This is a courtesy call and so if there is no answer at the home number  and we have not heard from you through the emergency physician on call, we will assume that you have returned to your regular daily activities without incident.  SIGNATURES/CONFIDENTIALITY: You and/or your care partner have signed paperwork which will be entered into your electronic medical record.  These signatures attest to the fact that that the information above on your After Visit Summary has been reviewed and is understood.  Full responsibility of the confidentiality of this discharge information lies with you and/or your care-partner.

## 2014-05-03 NOTE — Op Note (Signed)
[place]  [address] [place], [address]   COLONOSCOPY PROCEDURE REPORT  PATIENT: Michelle, [name]  MR#: [id number] BIRTHDATE: 05/20/1951 , 46  yrs. old GENDER: female ENDOSCOPIST: Milus Banister, MD PROCEDURE DATE:  05/03/2014 PROCEDURE:   Colonoscopy with snare polypectomy First Screening Colonoscopy - Avg.  risk and is 50 yrs.  old or older - No.  Prior Negative Screening - Now for repeat screening. N/A  History of Adenoma - Now for follow-up colonoscopy & has been > or = to 3 yrs.  Yes hx of adenoma.  Has been 3 or more years since last colonoscopy.  Polyps Removed Today? Yes. ASA CLASS:   Class II INDICATIONS:Elevated risk for colon cancer (FH +): Colonoscopy January 2012 findings diverticulosis but was otherwise normal.  Dr. Cristina Gong recommended she have a repeat colonoscopy at 10 year interval for her routine risk screening.  Shortly after that her daughter was diagnosed with colon cancer which puts her at increased risk and recall was adjusted to 5 year interval.  Now with change in bowel habits.Marland Kitchen MEDICATIONS: Monitored anesthesia care and Propofol 200 mg IV  DESCRIPTION OF PROCEDURE:   After the risks benefits and alternatives of the procedure were thoroughly explained, informed consent was obtained.  The digital rectal exam revealed no abnormalities of the rectum.   The LB [id number]  endoscope was introduced through the anus and advanced to the cecum, which was identified by both the appendix and ileocecal valve. No adverse events experienced.   The quality of the prep was good.  The instrument was then slowly withdrawn as the colon was fully examined.  COLON FINDINGS: Two sessile polyps ranging between 3-7mm in size were found in the descending colon.  Polypectomies were performed with a cold snare.  The resection was complete, the polyp tissue was completely retrieved and sent to histology.   There was mild diverticulosis noted in the  left colon.   The examination was otherwise normal.  Retroflexed views revealed no abnormalities. The time to cecum=3 minutes 09 seconds.  Withdrawal time=10 minutes 29 seconds.  The scope was withdrawn and the procedure completed. COMPLICATIONS: There were no immediate complications.  ENDOSCOPIC IMPRESSION: 1.   Two sessile polyps ranging between 3-32mm in size were found in the descending colon; polypectomies were performed with a cold snare 2.   Mild diverticulosis was noted in the left colon 3.   The examination was otherwise normal  RECOMMENDATIONS: If the polyp(s) removed today are proven to be adenomatous (pre-cancerous) polyps, you will need a repeat colonoscopy in 5 years.  You will receive a letter within 1-2 weeks with the results of your biopsy as well as final recommendations.  Please call my office if you have not received a letter after 3 weeks.  eSigned:  Milus Banister, MD 05/03/2014 3:18 PM  cc: Tommi Rumps, MD   PATIENT NAME:  Michelle, [name] MR#: [id number]

## 2014-05-03 NOTE — Progress Notes (Signed)
Report to PACU, RN, vss, BBS= Clear.  

## 2014-05-04 ENCOUNTER — Telehealth: Payer: Self-pay | Admitting: *Deleted

## 2014-05-04 NOTE — Telephone Encounter (Signed)
  Follow up Call-  Call back number 05/03/2014 10/15/2011  Post procedure Call Back phone  # [phone] hm   Permission to leave phone message Yes Yes     No answer,left message.

## 2014-05-09 ENCOUNTER — Other Ambulatory Visit: Payer: Self-pay | Admitting: Family Medicine

## 2014-05-11 NOTE — Telephone Encounter (Signed)
Trazodone has not been filled since 2013. Please call her to see if she has still been taking this. If she has I will refill it and we will need to talk about her sleep at her next visit. If she has not, she will need to come in for a visit to discuss her sleep. Thanks.

## 2014-05-12 ENCOUNTER — Other Ambulatory Visit: Payer: Self-pay

## 2014-05-12 MED ORDER — AMOXICILLIN 500 MG PO CAPS: 1000.0000 mg | ORAL_CAPSULE | Freq: Two times a day (BID) | ORAL | Status: DC

## 2014-05-12 MED ORDER — CLARITHROMYCIN 500 MG PO TABS: 500.0000 mg | ORAL_TABLET | Freq: Two times a day (BID) | ORAL | Status: DC

## 2014-05-12 NOTE — Telephone Encounter (Signed)
Spoke with pt.  She had some pills left over from 2013 and decided to start taking them again since she is having trouble sleeping.  Will forward back to MD. Clinton Sawyer, Salome Spotted

## 2014-05-12 NOTE — Telephone Encounter (Signed)
Covering for Dr Caryl Bis. It looks like he wants her to come in. Does she have enough to get her to a re-eval appointment with Dr Caryl Bis or does she need a refill for 1 month?  Hilton Sinclair, MD

## 2014-05-12 NOTE — Telephone Encounter (Signed)
She will need a refill. Jalayna Josten, Salome Spotted

## 2014-05-12 NOTE — Telephone Encounter (Signed)
Filled trazodone for 30 days. Pt needs MD f/u prior to further fills.  Hilton Sinclair, MD

## 2014-05-12 NOTE — Telephone Encounter (Signed)
Called and spoke with patient because interaction came up for potential QT prolongation with celexa and trazodone. Risk is somewhat low if patient is on 20mg  celexa or lower and has minimal comorbidities. SHe is on 40mg  celexa and has multiple medical problems (asthma, history of chest pain, COPD, DM, obesity, RA; last EKG with QTc also 497 01/2013). For her age (>5), there is also black box warning for celexa over 20mg  doses.  - She is amenable to switching to lower dose celexa (20mg ) and discussing with Dr Caryl Bis at f/u on 11/24. If not controlling anxiety/depression well at that dose, could consider switching to lexapro. - She is also amenable to only using 1/2 tab trazodone QHS PRN if possible. - Will fwd to PCP.  Hilton Sinclair, MD

## 2014-05-14 ENCOUNTER — Other Ambulatory Visit: Payer: Self-pay | Admitting: Family Medicine

## 2014-05-23 ENCOUNTER — Encounter: Payer: Self-pay | Admitting: Family Medicine

## 2014-05-23 ENCOUNTER — Ambulatory Visit (INDEPENDENT_AMBULATORY_CARE_PROVIDER_SITE_OTHER): Payer: Medicaid Other | Admitting: Family Medicine

## 2014-05-23 VITALS — BP 132/68 | HR 64 | Ht 63.0 in | Wt 167.0 lb

## 2014-05-23 DIAGNOSIS — F419 Anxiety disorder, unspecified: Secondary | ICD-10-CM

## 2014-05-23 DIAGNOSIS — G252 Other specified forms of tremor: Secondary | ICD-10-CM

## 2014-05-23 DIAGNOSIS — F418 Other specified anxiety disorders: Secondary | ICD-10-CM

## 2014-05-23 DIAGNOSIS — J441 Chronic obstructive pulmonary disease with (acute) exacerbation: Secondary | ICD-10-CM

## 2014-05-23 DIAGNOSIS — J45909 Unspecified asthma, uncomplicated: Secondary | ICD-10-CM

## 2014-05-23 DIAGNOSIS — F32A Depression, unspecified: Secondary | ICD-10-CM

## 2014-05-23 DIAGNOSIS — R079 Chest pain, unspecified: Secondary | ICD-10-CM

## 2014-05-23 DIAGNOSIS — F329 Major depressive disorder, single episode, unspecified: Secondary | ICD-10-CM

## 2014-05-23 DIAGNOSIS — K219 Gastro-esophageal reflux disease without esophagitis: Secondary | ICD-10-CM

## 2014-05-23 DIAGNOSIS — M069 Rheumatoid arthritis, unspecified: Secondary | ICD-10-CM

## 2014-05-23 DIAGNOSIS — R258 Other abnormal involuntary movements: Secondary | ICD-10-CM

## 2014-05-23 DIAGNOSIS — J449 Chronic obstructive pulmonary disease, unspecified: Secondary | ICD-10-CM

## 2014-05-23 MED ORDER — TIOTROPIUM BROMIDE MONOHYDRATE 18 MCG IN CAPS
18.0000 ug | ORAL_CAPSULE | Freq: Every day | RESPIRATORY_TRACT | Status: DC
Start: 2014-05-23 — End: 2015-01-19

## 2014-05-23 MED ORDER — CLONAZEPAM 1 MG PO TABS: 1.0000 mg | ORAL_TABLET | Freq: Two times a day (BID) | ORAL | Status: DC | PRN

## 2014-05-23 MED ORDER — OXYCODONE-ACETAMINOPHEN 10-325 MG PO TABS: 1.0000 | ORAL_TABLET | Freq: Three times a day (TID) | ORAL | Status: DC | PRN

## 2014-05-23 MED ORDER — OXYCODONE-ACETAMINOPHEN 10-325 MG PO TABS: 1.0000 | ORAL_TABLET | Freq: Four times a day (QID) | ORAL | Status: DC | PRN

## 2014-05-23 NOTE — Patient Instructions (Signed)
Nice to see you.  Please taper down on your propranolol. Start with 20 mg (1/2 Tablet) twice a day for a week. Then 20 mg (1/2 tablet) daily for a week. Then 20 mg (1/2 tablet) every other day for a week. Then stop taking this.  Please start the spiriva for your COPD.

## 2014-05-24 ENCOUNTER — Other Ambulatory Visit: Payer: Self-pay | Admitting: *Deleted

## 2014-05-25 NOTE — Assessment & Plan Note (Signed)
Atypical in nature with no CAD seen on cardiac cath in February makes cardiac cause unlikely. Likely MSK in origin or related to poorly controlled lung disease. Will continue to monitor. Given return precautions.

## 2014-05-25 NOTE — Assessment & Plan Note (Signed)
Being treated for H pylori infection. Followed by GI.

## 2014-05-25 NOTE — Assessment & Plan Note (Signed)
Refilled percocet. Followed by rheum at [facility].

## 2014-05-25 NOTE — Progress Notes (Signed)
Patient ID: YULIZA CARA, female   DOB: [birth year], 63 y.o.   MRN: [id number]  Tommi Rumps, MD Phone: [phone]  Michelle [name] is a 63 y.o. female who presents today for f/u.  RA: notes she is back on methotrexate. States it was not affecting her lungs. She is now on prednisone for a recent flare. Flares last 3 days. When they occur she takes her percocet every 4 hours. Typically affects her right hand and left foot the most.   Anxiety: stable on klonopin. Notes this works well for her. Cut celexa in half as she started taking trazodone for sleep recently. She has not noted any worsening of her anxiety with this decrease in celexa.   GERD: GI did an EGD and found H pylori on biopsy. She is being treated for this currently and followed by GI for this.   Essential tremor: patient notes she wants to come off her propranolol as she does not want to be on too many medications. She thinks this has not helped that much.   COPD/asthma: patient notes that she has continued to use her albuterol inhaler twice daily due to shortness of breath and wheezing. She finally started using her qvar daily for this as she had not been doing this previously. She was seen by a pulmonologist at [facility] and had PFTs that revealed mild restrictive lung disease and they noted a history of mild asthma at that time. They did not make any medication changes. She notes the shortness of breath occurs with increased activity. She notes sharp chest pain as well on the left side. She had a cardiac cath to evaluate her chronic pain in February that revealed no CAD. Her pain is unchanged since that time. No chest pain or shortness of breath at this time.    Patient is a former smoker.   ROS: Per HPI   Physical Exam Filed Vitals:   05/23/14 0926  BP: 132/68  Pulse: 64    Gen: Well NAD HEENT: PERRL,  MMM Lungs: CTABL Nl WOB Heart: RRR no MRG Neuro: no apparent tremor noted Exts: Non edematous BL  LE, warm and  well perfused.    Assessment/Plan: Please see individual problem list.  Tommi Rumps, MD [place] PGY-3

## 2014-05-25 NOTE — Assessment & Plan Note (Signed)
Stable at this time. Refills given on klonopin.

## 2014-05-25 NOTE — Assessment & Plan Note (Signed)
Patient desires to come off propranolol. Will taper and monitor for recurrence of her symptoms.

## 2014-05-25 NOTE — Assessment & Plan Note (Signed)
May be combination of asthma and based on [facility] pulmonology note restrictive lung disease. Patient continues to use albuterol excessively. She has finally started to use her qvar consistently. Given persistent excessive use of albuterol, we will start on spiriva for a possible COPD component. If this is not beneficial will need to consider repeat PFTs and addition of LABA. No abnormal lung sounds on exam to indicate acute pulmonary process causing this issue. Will see back in one month to check for improvement.

## 2014-05-30 ENCOUNTER — Encounter: Payer: Self-pay | Admitting: *Deleted

## 2014-05-30 NOTE — Progress Notes (Signed)
Prior Authorization received from [facility] for Losartan 50 mg. Formulary and PA form placed in provider box for completion. Derl Barrow, RN

## 2014-05-31 NOTE — Progress Notes (Addendum)
PA pending per [employer] [employer].  Confirmation number [id number] Michelle [name], Rosine Beat, RN  PA approved per [facility].  Approval good 05/31/14-05/31/15.  [facility] aware of approval.  Derl Barrow, RN

## 2014-05-31 NOTE — Progress Notes (Signed)
PA filled out and returned to Tamika Martin. 

## 2014-06-08 ENCOUNTER — Other Ambulatory Visit: Payer: Self-pay | Admitting: Family Medicine

## 2014-06-09 ENCOUNTER — Encounter (HOSPITAL_COMMUNITY): Payer: Self-pay | Admitting: Cardiology

## 2014-06-13 ENCOUNTER — Ambulatory Visit (INDEPENDENT_AMBULATORY_CARE_PROVIDER_SITE_OTHER): Payer: Medicaid Other | Admitting: Family Medicine

## 2014-06-13 ENCOUNTER — Encounter: Payer: Self-pay | Admitting: Family Medicine

## 2014-06-13 VITALS — BP 111/74 | HR 95 | Temp 98.4°F | Resp 18 | Wt 166.0 lb

## 2014-06-13 DIAGNOSIS — G43809 Other migraine, not intractable, without status migrainosus: Secondary | ICD-10-CM

## 2014-06-13 MED ORDER — KETOROLAC TROMETHAMINE 30 MG/ML IM SOLN: 30.0000 mg | Freq: Once | INTRAMUSCULAR | Status: DC

## 2014-06-13 MED ORDER — KETOROLAC TROMETHAMINE 30 MG/ML IJ SOLN
30.0000 mg | Freq: Once | INTRAMUSCULAR | Status: AC
  Administered 2014-06-13: 30 mg via INTRAMUSCULAR

## 2014-06-13 MED ORDER — BUTALBITAL-ACETAMINOPHEN 50-325 MG PO TABS: 1.0000 | ORAL_TABLET | Freq: Four times a day (QID) | ORAL | Status: DC | PRN

## 2014-06-13 NOTE — Patient Instructions (Signed)

## 2014-06-13 NOTE — Progress Notes (Signed)
   Subjective:    Patient ID: Michelle [name], female    DOB: 1950/11/21, 63 y.o.   MRN: [id number]  HPI  Dizziness: "if I look over, I get real swimmy-headed", does not feel like vertigo which she has had twice in past.  Dizziness is currently associated with a headache. - hx of headaches previously, but they improved greatly after she had nasal surgery for deviated septum   Review of Systems  Constitutional: Negative for chills and diaphoresis.  HENT: Positive for hearing loss (baseline per pt) and sinus pressure. Negative for congestion, ear pain, rhinorrhea, sneezing, sore throat and tinnitus.   Respiratory: Negative for cough and shortness of breath.   Gastrointestinal: Positive for vomiting (hx of delayed gastric emptying). Negative for abdominal pain, diarrhea, constipation and anal bleeding.  Endocrine: Positive for cold intolerance. Negative for heat intolerance.  Genitourinary: Positive for frequency. Negative for dysuria, urgency, decreased urine volume and difficulty urinating.  Neurological: Positive for light-headedness and headaches. Negative for syncope.       Objective:   Physical Exam  Constitutional: She appears well-developed and well-nourished. No distress.  HENT:  Mouth/Throat: Mucous membranes are moist. Pharynx is normal.  Eyes: Conjunctivae and EOM are normal.  Neck: No adenopathy.  Cardiovascular: Regular rhythm and S2 normal.   Pulmonary/Chest: No respiratory distress.  Crackles through out   Abdominal: She exhibits no distension. There is no tenderness.  Musculoskeletal: Normal range of motion.  Neurological: She is alert. No cranial nerve deficit (CN II-XII intact). Coordination normal.  Negative dix-hallpike although she did feel dizzy when she sat up  Skin: Skin is warm. No rash noted. She is not diaphoretic. No pallor.    MRI brain w/wo contrast from 2014:  IMPRESSION: No acute abnormality.  Mild chronic microvascular ischemia in the white  matter. No acute infarct.  Developmental venous anomaly left posterior frontal operculum.      Assessment & Plan:  Maleka was seen today for dizziness.  Diagnoses and associated orders for this visit:  Migraine with dizziness - ACETAMINOPHEN-BUTALBITAL 50-325 MG TABS; Take 1-2 tablets by mouth every 6 (six) hours as needed (migraine). - ketorolac (TORADOL) 30 MG/ML injection; Inject 1 mL (30 mg total) into the muscle once.  Do not suspect vertigo, however given handout on epley maneuvers to do at home just in case.  Strongly suspect migraine with dizziness component.

## 2014-06-19 ENCOUNTER — Other Ambulatory Visit: Payer: Self-pay | Admitting: Family Medicine

## 2014-06-27 ENCOUNTER — Other Ambulatory Visit: Payer: Self-pay | Admitting: *Deleted

## 2014-06-27 MED ORDER — NEXIUM 40 MG PO CPDR: 40.0000 mg | DELAYED_RELEASE_CAPSULE | Freq: Every day | ORAL | Status: DC

## 2014-06-27 MED ORDER — CITALOPRAM HYDROBROMIDE 40 MG PO TABS: 20.0000 mg | ORAL_TABLET | Freq: Every day | ORAL | Status: DC

## 2014-06-27 MED ORDER — ROSUVASTATIN CALCIUM 10 MG PO TABS: 10.0000 mg | ORAL_TABLET | Freq: Every day | ORAL | Status: DC

## 2014-06-27 NOTE — Telephone Encounter (Signed)
Called patient to discuss interaction between celexa and nexium. Advised on risk for prolonged QT and risk for arrhythmia. Discussed changing nexium or celexa and the options for starting the change now vs discussing the change at her follow-up visit next week. She opted to discuss the change at her f/u visit next week. Given previous EKG with QT of 497 will need to consider checking a repeat EKG at that time. She was agreeable with this plan. Refill sent in.

## 2014-07-05 ENCOUNTER — Encounter: Payer: Self-pay | Admitting: Family Medicine

## 2014-07-05 ENCOUNTER — Ambulatory Visit (HOSPITAL_COMMUNITY)
Admission: RE | Admit: 2014-07-05 | Discharge: 2014-07-05 | Disposition: A | Discharge status: Home / Self Care | Payer: Medicaid Other | Source: Ambulatory Visit | Attending: Family Medicine | Admitting: Family Medicine

## 2014-07-05 ENCOUNTER — Ambulatory Visit (INDEPENDENT_AMBULATORY_CARE_PROVIDER_SITE_OTHER): Payer: Medicaid Other | Admitting: Family Medicine

## 2014-07-05 ENCOUNTER — Other Ambulatory Visit: Payer: Self-pay | Admitting: Family Medicine

## 2014-07-05 VITALS — BP 134/84 | HR 90 | Temp 98.2°F | Ht 63.0 in | Wt 169.0 lb

## 2014-07-05 DIAGNOSIS — R9431 Abnormal electrocardiogram [ECG] [EKG]: Secondary | ICD-10-CM | POA: Insufficient documentation

## 2014-07-05 DIAGNOSIS — J4540 Moderate persistent asthma, uncomplicated: Secondary | ICD-10-CM

## 2014-07-05 DIAGNOSIS — I4581 Long QT syndrome: Secondary | ICD-10-CM | POA: Insufficient documentation

## 2014-07-05 DIAGNOSIS — H93A1 Pulsatile tinnitus, right ear: Secondary | ICD-10-CM

## 2014-07-05 DIAGNOSIS — F32A Depression, unspecified: Secondary | ICD-10-CM

## 2014-07-05 DIAGNOSIS — E119 Type 2 diabetes mellitus without complications: Secondary | ICD-10-CM

## 2014-07-05 DIAGNOSIS — F329 Major depressive disorder, single episode, unspecified: Secondary | ICD-10-CM

## 2014-07-05 DIAGNOSIS — H9311 Tinnitus, right ear: Secondary | ICD-10-CM

## 2014-07-05 DIAGNOSIS — R413 Other amnesia: Secondary | ICD-10-CM | POA: Insufficient documentation

## 2014-07-05 DIAGNOSIS — G3184 Mild cognitive impairment, so stated: Secondary | ICD-10-CM | POA: Insufficient documentation

## 2014-07-05 LAB — POCT GLYCOSYLATED HEMOGLOBIN (HGB A1C): Hemoglobin A1C: 6.9

## 2014-07-05 MED ORDER — FORMOTEROL FUMARATE 12 MCG IN CAPS: 12.0000 ug | ORAL_CAPSULE | Freq: Two times a day (BID) | RESPIRATORY_TRACT | Status: DC

## 2014-07-05 MED ORDER — CITALOPRAM HYDROBROMIDE 10 MG PO TABS: 10.0000 mg | ORAL_TABLET | Freq: Every day | ORAL | Status: DC

## 2014-07-05 NOTE — Assessment & Plan Note (Signed)
Patient with evidence of prolonged QT on previous EKG. Repeat EKG with Qtc of 483. Still prolonged. No palpitations noted. Will taper of celexa as this can cause prolonged QT interval. Consider checking electrolytes at next visit, though these have been normal in the past when she had previously seen prolonged QT.

## 2014-07-05 NOTE — Assessment & Plan Note (Signed)
Notes getting lost yesterday. Had an episode of this previously and had an MMSE of 30/30 with in the past year. She is able to recount all the details of the event yesterday which is reassuring. Will continue to monitor and consider repeat MMSE if continues to have issues.

## 2014-07-05 NOTE — Assessment & Plan Note (Addendum)
Patient notes wooshing sensation in her right ear for the past 3 months. She endorses mild hearing loss as well. No focal neurological deficits on exam. Previously with had MRI to evaluate HA that revealed a DVA on the left and based on review of uptodate this is typically managed conservatively. Given that this is on the left and symptoms are on the right it is less likely to be the cause of her tinnitus. Concern would be for a vascular cause of this tinnitus with pulsatile wooshing sensation. She is neurologically in tact at this time. Given this issue has been going on for several months will refer to ENT for further evaluation.
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
## 2014-07-05 NOTE — Assessment & Plan Note (Signed)
A1c at goal. Continue current metformin dosing. Patient to monitor for hypoglycemia, though doubt this is related to metformin. Will continue to monitor.

## 2014-07-05 NOTE — Patient Instructions (Signed)
Nice to see you. Please start using the foradil for your asthma. This will help decrease your shortness of breath.  Please follow-up with Dr Simeon Craft as scheduled for your right ear.  Please taper off the celexa as outlined in the prescription.

## 2014-07-05 NOTE — Assessment & Plan Note (Addendum)
Shortness of breath likely related to asthma given responsiveness to albuterol. Not likely cardiac in nature given negative cardiac cath in 2015. Patient did not tolerate spiriva due mouth irritation. Still excessively using albuterol. She is to continue to use her qvar which is at the max dose. Will add LABA, foradil, as a controller medication in addition to her qvar. I discussed that she really should not be using her albuterol as much as she is and that our goal  Is to get her to a point where she is not short of breath every day.

## 2014-07-11 ENCOUNTER — Other Ambulatory Visit: Payer: Self-pay | Admitting: Family Medicine

## 2014-07-11 DIAGNOSIS — Z1231 Encounter for screening mammogram for malignant neoplasm of breast: Secondary | ICD-10-CM

## 2014-07-17 ENCOUNTER — Other Ambulatory Visit: Payer: Self-pay | Admitting: Family Medicine

## 2014-07-19 ENCOUNTER — Telehealth: Payer: Self-pay | Admitting: Family Medicine

## 2014-07-19 NOTE — Telephone Encounter (Signed)
Pt called because the new inhaler is also making her mouth break out in sores, so she is no longer taking it. She will discuss with Dr. Caryl Bis on her next visit. jw

## 2014-07-21 ENCOUNTER — Telehealth: Payer: Self-pay | Admitting: Family Medicine

## 2014-07-21 NOTE — Telephone Encounter (Signed)
Clarification given to pharmacist by pcp. Abbagale Goguen,CMA

## 2014-07-21 NOTE — Telephone Encounter (Signed)
Noted. Will discuss at upcoming visit

## 2014-07-21 NOTE — Telephone Encounter (Signed)
Barnabas Lister from [facility] called to get some clarification on the one of the prescription that was written different from the others. Please call Barnabas Lister at [phone]. jw

## 2014-07-31 ENCOUNTER — Other Ambulatory Visit: Payer: Self-pay | Admitting: Family Medicine

## 2014-08-01 ENCOUNTER — Ambulatory Visit (INDEPENDENT_AMBULATORY_CARE_PROVIDER_SITE_OTHER): Payer: Medicaid Other | Admitting: Family Medicine

## 2014-08-01 ENCOUNTER — Encounter: Payer: Self-pay | Admitting: Family Medicine

## 2014-08-01 VITALS — BP 124/83 | HR 88 | Temp 98.4°F | Ht 63.0 in | Wt 164.4 lb

## 2014-08-01 DIAGNOSIS — K120 Recurrent oral aphthae: Secondary | ICD-10-CM

## 2014-08-01 MED ORDER — TRIAMCINOLONE ACETONIDE 0.1 % MT PSTE: 1.0000 "application " | PASTE | Freq: Two times a day (BID) | OROMUCOSAL | Status: DC

## 2014-08-01 NOTE — Patient Instructions (Signed)
Nice to see you. Please apply the paste to the ulcers twice a day.  If the ulcerations worsen, you develop fever, or rash please let us know.

## 2014-08-03 DIAGNOSIS — K120 Recurrent oral aphthae: Secondary | ICD-10-CM | POA: Insufficient documentation

## 2014-08-03 NOTE — Progress Notes (Signed)
Patient ID: PRAJNA VANDERPOOL, female   DOB: 1950/07/23, 64 y.o.   MRN: [id number]  Tommi Rumps, MD Phone: [phone]  LIMA CHILLEMI is a 64 y.o. female who presents today for same day appointment.  Sore mouth: notes sores in mouth for past 2 months. States inside the lips and inside of cheeks are sore. Tongue is fine. Started after starting on spiriva, though did not get better with d/c of this med. Got worse with her most recent inhaler. She talked to her rheumatologist about this and they thought it was related to her methotrexate and increased her folic acid as a result. Only thing that helps is the 3rd cup of coffee. Tried magic mouthwash with no benefit. She denies fever and rash elsewhere. No sore throat.  Patient is a former smoker.   ROS: Per HPI   Physical Exam Filed Vitals:   08/01/14 0953  BP: 124/83  Pulse: 88  Temp: 98.4 F (36.9 C)    Gen: Well NAD HEENT: PERRL,  MMM, several scattered small ulcerations on the inner upper and lower lip and left inner posterior cheek, no drainage, no surrounding erythema, no white plaques, no OP erythema or exudates   Assessment/Plan: Please see individual problem list.  Tommi Rumps, MD [place] PGY-3

## 2014-08-03 NOTE — Assessment & Plan Note (Signed)
Lesions consistent with aphthous stomatitis. Likely related to her methotrexate. Unlikely to be a candidal infection given appearance. Will treat with triamcinolone paste. Given return precautions.

## 2014-08-05 ENCOUNTER — Encounter: Payer: Self-pay | Admitting: Family Medicine

## 2014-08-05 ENCOUNTER — Ambulatory Visit (INDEPENDENT_AMBULATORY_CARE_PROVIDER_SITE_OTHER): Payer: Medicaid Other | Admitting: Family Medicine

## 2014-08-05 VITALS — BP 127/79 | HR 85 | Temp 98.1°F | Ht 63.0 in | Wt 168.0 lb

## 2014-08-05 DIAGNOSIS — J984 Other disorders of lung: Secondary | ICD-10-CM

## 2014-08-05 DIAGNOSIS — G8929 Other chronic pain: Secondary | ICD-10-CM

## 2014-08-05 DIAGNOSIS — R05 Cough: Secondary | ICD-10-CM

## 2014-08-05 DIAGNOSIS — F418 Other specified anxiety disorders: Secondary | ICD-10-CM

## 2014-08-05 DIAGNOSIS — M069 Rheumatoid arthritis, unspecified: Secondary | ICD-10-CM

## 2014-08-05 DIAGNOSIS — M25561 Pain in right knee: Secondary | ICD-10-CM

## 2014-08-05 DIAGNOSIS — F32A Depression, unspecified: Secondary | ICD-10-CM

## 2014-08-05 DIAGNOSIS — R053 Chronic cough: Secondary | ICD-10-CM

## 2014-08-05 DIAGNOSIS — R079 Chest pain, unspecified: Secondary | ICD-10-CM

## 2014-08-05 DIAGNOSIS — Z7189 Other specified counseling: Secondary | ICD-10-CM

## 2014-08-05 DIAGNOSIS — M542 Cervicalgia: Secondary | ICD-10-CM

## 2014-08-05 DIAGNOSIS — F419 Anxiety disorder, unspecified: Secondary | ICD-10-CM

## 2014-08-05 DIAGNOSIS — F329 Major depressive disorder, single episode, unspecified: Secondary | ICD-10-CM

## 2014-08-05 MED ORDER — CLONAZEPAM 1 MG PO TABS: 1.0000 mg | ORAL_TABLET | Freq: Two times a day (BID) | ORAL | Status: DC | PRN

## 2014-08-05 MED ORDER — NEXIUM 40 MG PO CPDR: 40.0000 mg | DELAYED_RELEASE_CAPSULE | Freq: Two times a day (BID) | ORAL | Status: DC

## 2014-08-05 MED ORDER — OXYCODONE-ACETAMINOPHEN 10-325 MG PO TABS: 1.0000 | ORAL_TABLET | Freq: Four times a day (QID) | ORAL | Status: DC | PRN

## 2014-08-05 MED ORDER — SERTRALINE HCL 50 MG PO TABS: 50.0000 mg | ORAL_TABLET | Freq: Every day | ORAL | Status: DC

## 2014-08-05 MED ORDER — OXYCODONE-ACETAMINOPHEN 10-325 MG PO TABS: 1.0000 | ORAL_TABLET | Freq: Three times a day (TID) | ORAL | Status: DC | PRN

## 2014-08-05 MED ORDER — CYCLOBENZAPRINE HCL 10 MG PO TABS: 10.0000 mg | ORAL_TABLET | Freq: Three times a day (TID) | ORAL | Status: DC | PRN

## 2014-08-05 NOTE — Patient Instructions (Signed)
Nice to see you. We will start you on zoloft for your depression. If you feel as though you are going to hurt yourself or others please go to the ED.  Please continued the heating pad and percocet for your pain. We will try flexeril for your neck pain.  Please continue the qvar daily. We will increase your nexium to twice a day. If you develop chest pain, shortness of breath, abdominal pain, blood in your stool, blood in vomit, numbness or weakness please seek medical attention.

## 2014-08-05 NOTE — Assessment & Plan Note (Addendum)
Depression worsened after coming off of celexa. PHQ9 of 15. No SI or HI. Will start on zoloft. Refill of klonopin given for 3 months. F/u in one month. Given return precautions.

## 2014-08-05 NOTE — Assessment & Plan Note (Signed)
Patient with chronic cough. Potentially related to history of asthma, though recent PFTs endorse restrictive lung disease. Patient does note intermittent reflux and indigestion despite being on daily PPI. Had recent EGD with mild gastritis. Favor GERD as cause of cough at this time. No abnormalities on pulmonary exam and afebrile making acute infectious process unlikely. Will treat GERD with BID nexium for one month. Encouraged to use qvar daily for possible asthma component.

## 2014-08-05 NOTE — Progress Notes (Signed)
Patient ID: Michelle [name], female   DOB: Dec 06, 1950, 64 y.o.   MRN: [id number]  Tommi Rumps, MD Phone: [phone]  Michelle [name] is a 64 y.o. female who presents today for f/u.  Depression: patient notes worsening depression since coming off of celexa. Notes she can tell a difference since coming off the celexa. She notes crying easily. Decreased appetite. Notes issues with bills drive most of this. Denies SI and HI. Was on zoloft years ago, though did not give this the opportunity to work. Denies illicit drug use, alcohol and tobacco use.   Neck pain: notes this has happened 2x this week. Is achey. Thinks this is related to tossing and turning at night and having a flat pillow. Notes it first occurred last Friday and improved over the following day. Then recurred last night. Denies weakness and numbness in arms and legs. Notes percocet and heating pad helps.   Right knee pain: notes last 2 days has had achiness in right knee. No injury. Hurts more with walking and standing. Notes intermittent swelling. States she had 2 surgeries on this knee for "cartilage removal." Pain pill helps and heating pad helps.   Anxiety: states klonopin is helpful for this. Needs refills.   Cough: notes increase in cough. States she was not taking her qvar, though over the past several days she started back on this and has noticed an improvement. She notes using her albuterol 1-2x/day if she gets a little short of breath. Denies fevers. She does endorse reflux, burning sensation, and indigestion. She notes she had EGD recently with mild pan gastritis. Has tried protonix with little success in the past.   Chest pain: notes had an episode of left costochondral area sharp pain last night that lasted for a few seconds. Resolved on its own. No radiation, diaphoresis, or dyspnea with this. Had cardiac cath one year ago with no significant CAD.   Patient is a former smoker.   ROS: Per HPI   Physical  Exam Filed Vitals:   08/05/14 0856  BP: 127/79  Pulse: 85  Temp: 98.1 F (36.7 C)    Gen: Well NAD HEENT: PERRL,  MMM Lungs: CTABL Nl WOB Heart: RRR  Abd: soft, NT, ND MSK: mild tender ness with palpation of left costochondral joints, there is mild discomfort with palpation of bilateral cervical paraspinous muscles, she has full ROM of neck, there is no swelling or erythema of bilateral knees, there is no ligamentous laxity, negative mcmurray, no joint line tenderness Exts: Non edematous BL  LE, warm and well perfused.    Assessment/Plan: Please see individual problem list.  Tommi Rumps, MD [place] PGY-3

## 2014-08-05 NOTE — Assessment & Plan Note (Signed)
Pain in neck likely related to muscle strain related to sleeping position. She has no neurological abnormalities. She has full ROM. Will give trial of flexeril. She is to continue heating pad. She will try a different pillow at home. She can continue her percocet for pain. Given return precautions.

## 2014-08-05 NOTE — Assessment & Plan Note (Signed)
Last PFTs at [facility] revealed mild restrictive lung disease. Will continue albuterol prn and she is to continue to use qvar daily and this has appeared beneficial in the past. Will continue to monitor.

## 2014-08-05 NOTE — Assessment & Plan Note (Signed)
Atypical in nature. No CAD seen on cardiac cath 1 year ago making cardiac cause unlikely. Tender over costochondral joints on left. Likely MSK in origin. Will continue to monitor. Given return precautions.

## 2014-08-05 NOTE — Assessment & Plan Note (Signed)
Possibly related to OA, prior history of what sounds like meniscal injury, or RA. No abnormalities on exam today. Afebrile and no warmth or erythema making infection unlikely. Will continue to use percocet for pain. Heating pad as well. Could consider PT if does not improve. Also consider XR of knee if not improving.

## 2014-08-06 LAB — DRUG SCR UR, PAIN MGMT, REFLEX CONF
AMPHETAMINE SCRN UR: NEGATIVE
BENZODIAZEPINES.: NEGATIVE
Barbiturate Quant, Ur: NEGATIVE
Cocaine Metabolites: NEGATIVE
Creatinine,U: 105.25 mg/dL
Marijuana Metabolite: NEGATIVE
Methadone: NEGATIVE
Opiates: NEGATIVE
PHENCYCLIDINE (PCP): NEGATIVE
Propoxyphene: NEGATIVE

## 2014-08-10 ENCOUNTER — Ambulatory Visit (HOSPITAL_COMMUNITY)
Admission: RE | Admit: 2014-08-10 | Discharge: 2014-08-10 | Disposition: A | Discharge status: Home / Self Care | Payer: Medicaid Other | Source: Ambulatory Visit | Attending: Family Medicine | Admitting: Family Medicine

## 2014-08-10 DIAGNOSIS — Z1231 Encounter for screening mammogram for malignant neoplasm of breast: Secondary | ICD-10-CM | POA: Insufficient documentation

## 2014-08-12 ENCOUNTER — Other Ambulatory Visit: Payer: Self-pay | Admitting: Family Medicine

## 2014-08-12 MED ORDER — NEXIUM 40 MG PO CPDR: 40.0000 mg | DELAYED_RELEASE_CAPSULE | Freq: Two times a day (BID) | ORAL | Status: DC

## 2014-08-12 NOTE — Progress Notes (Signed)
Pt is aware.  Pamela Intrieri,CMA  

## 2014-08-12 NOTE — Progress Notes (Signed)
Please let patient know there is a hard copy of her nexium waiting for pick up. Evidently medicaid requires a hard copy for dispense as written prescriptions. Thanks.

## 2014-08-16 ENCOUNTER — Other Ambulatory Visit: Payer: Self-pay | Admitting: Family Medicine

## 2014-08-18 ENCOUNTER — Other Ambulatory Visit: Payer: Self-pay | Admitting: *Deleted

## 2014-08-18 MED ORDER — GLUCOSE BLOOD VI STRP: ORAL_STRIP | Status: DC

## 2014-08-18 NOTE — Telephone Encounter (Signed)
[facility] called and needed rx sent to them.  Pt no longer uses [address].  Resent as requested. Demiya Magno, Salome Spotted

## 2014-08-19 MED ORDER — GABAPENTIN 300 MG PO CAPS: 300.0000 mg | ORAL_CAPSULE | Freq: Every day | ORAL | Status: DC

## 2014-08-19 NOTE — Addendum Note (Signed)
Addended by: Derl Barrow on: 08/19/2014 12:04 PM   Modules accepted: Orders

## 2014-08-19 NOTE — Telephone Encounter (Signed)
Needs 3 month supply of gabapentin sent to [facility] on [address]

## 2014-09-08 ENCOUNTER — Ambulatory Visit (INDEPENDENT_AMBULATORY_CARE_PROVIDER_SITE_OTHER): Payer: Medicaid Other | Admitting: Family Medicine

## 2014-09-08 ENCOUNTER — Encounter: Payer: Self-pay | Admitting: Family Medicine

## 2014-09-08 VITALS — BP 130/72 | HR 80 | Temp 98.6°F | Ht 63.0 in | Wt 166.0 lb

## 2014-09-08 DIAGNOSIS — F32A Depression, unspecified: Secondary | ICD-10-CM

## 2014-09-08 DIAGNOSIS — M069 Rheumatoid arthritis, unspecified: Secondary | ICD-10-CM

## 2014-09-08 DIAGNOSIS — F419 Anxiety disorder, unspecified: Principal | ICD-10-CM

## 2014-09-08 DIAGNOSIS — F418 Other specified anxiety disorders: Secondary | ICD-10-CM

## 2014-09-08 DIAGNOSIS — K120 Recurrent oral aphthae: Secondary | ICD-10-CM

## 2014-09-08 DIAGNOSIS — F329 Major depressive disorder, single episode, unspecified: Secondary | ICD-10-CM

## 2014-09-08 MED ORDER — TRIAMCINOLONE ACETONIDE 0.1 % EX CREA: TOPICAL_CREAM | CUTANEOUS | Status: DC

## 2014-09-08 NOTE — Patient Instructions (Signed)
Nice to see you. Please use the cream for you rash. If this does not help let us know.  Please continue your zoloft. Schedule a f/u appointment for the end of April.

## 2014-09-09 NOTE — Assessment & Plan Note (Signed)
Improved. No SI or HI. Continue zoloft.

## 2014-09-09 NOTE — Progress Notes (Signed)
Patient ID: DEONE OMAHONEY, female   DOB: 11-03-1950, 64 y.o.   MRN: [id number]  Tommi Rumps, MD Phone: [phone]  Michelle [name] is a 64 y.o. female who presents today for f/u.  Depression: much improved with zoloft. Does not feel depressed at all. Is taking medication. No SI or HI. Has lots of stressors at home with recent deaths in family, though states she feels like she is in a much better place to handle these issues.  Mouth ulcers: Not improved. Saw rheum yesterday. They took her off methotrexate given continued ulcers in mouth. They feel this med is the cause. Also told patient to continue folic acid until sores go away.   RA: had recent flares. Rheum placed her on prednisone taper. Changed to arava from methotrexate. Continues to use percocet for pain. Feels like swelling in hands has improved with prednisone.   Patient is a former smoker.    ROS: Per HPI   Physical Exam Filed Vitals:   09/08/14 0843  BP: 130/72  Pulse: 80  Temp: 98.6 F (37 C)    Gen: Well NAD Psych: mood not depressed, normal affect MSK: bilateral MCP joints with minimal swelling HEENT: scattered ulcers of inner upper and lower lips and inner posterior cheek unchanged from previously Exts: Non edematous BL  LE, warm and well perfused.    Assessment/Plan: Please see individual problem list.  Tommi Rumps, MD [place] PGY-3

## 2014-09-09 NOTE — Assessment & Plan Note (Signed)
Not improved. Likely related to methotrexate. Will monitor how these do now that she is off methotrexate. Continue folic acid.

## 2014-09-09 NOTE — Progress Notes (Signed)
I was the preceptor on the day of this visit.   Shirlie Enck MD  

## 2014-09-09 NOTE — Assessment & Plan Note (Signed)
Currently in flare. Is improving with prednisone. Will continue this and percocet for pain. Recent change to arava. Will need to monitor how she does with this medication. Continue f/u with rheum.

## 2014-09-13 ENCOUNTER — Other Ambulatory Visit: Payer: Self-pay | Admitting: Family Medicine

## 2014-09-16 ENCOUNTER — Other Ambulatory Visit: Payer: Self-pay | Admitting: Family Medicine

## 2014-09-16 MED ORDER — NEXIUM 40 MG PO CPDR: 40.0000 mg | DELAYED_RELEASE_CAPSULE | Freq: Two times a day (BID) | ORAL | Status: DC

## 2014-10-10 ENCOUNTER — Other Ambulatory Visit: Payer: Self-pay | Admitting: Family Medicine

## 2014-10-17 ENCOUNTER — Other Ambulatory Visit: Payer: Self-pay | Admitting: *Deleted

## 2014-10-26 LAB — HM DIABETES EYE EXAM

## 2014-10-27 ENCOUNTER — Encounter: Payer: Self-pay | Admitting: Family Medicine

## 2014-10-27 ENCOUNTER — Ambulatory Visit (INDEPENDENT_AMBULATORY_CARE_PROVIDER_SITE_OTHER): Payer: Medicaid Other | Admitting: Family Medicine

## 2014-10-27 ENCOUNTER — Ambulatory Visit: Payer: Medicaid Other | Admitting: Family Medicine

## 2014-10-27 ENCOUNTER — Telehealth: Payer: Self-pay | Admitting: Family Medicine

## 2014-10-27 VITALS — BP 124/76 | HR 92 | Temp 98.2°F | Ht 63.0 in | Wt 163.0 lb

## 2014-10-27 DIAGNOSIS — R3915 Urgency of urination: Secondary | ICD-10-CM | POA: Diagnosis not present

## 2014-10-27 DIAGNOSIS — F32A Depression, unspecified: Secondary | ICD-10-CM

## 2014-10-27 DIAGNOSIS — F418 Other specified anxiety disorders: Secondary | ICD-10-CM

## 2014-10-27 DIAGNOSIS — N898 Other specified noninflammatory disorders of vagina: Secondary | ICD-10-CM | POA: Diagnosis not present

## 2014-10-27 DIAGNOSIS — F329 Major depressive disorder, single episode, unspecified: Secondary | ICD-10-CM

## 2014-10-27 DIAGNOSIS — E119 Type 2 diabetes mellitus without complications: Secondary | ICD-10-CM | POA: Diagnosis present

## 2014-10-27 DIAGNOSIS — M069 Rheumatoid arthritis, unspecified: Secondary | ICD-10-CM

## 2014-10-27 DIAGNOSIS — L989 Disorder of the skin and subcutaneous tissue, unspecified: Secondary | ICD-10-CM

## 2014-10-27 DIAGNOSIS — F419 Anxiety disorder, unspecified: Secondary | ICD-10-CM

## 2014-10-27 LAB — POCT URINALYSIS DIPSTICK
BILIRUBIN UA: NEGATIVE
GLUCOSE UA: NEGATIVE
KETONES UA: NEGATIVE
Leukocytes, UA: NEGATIVE
Nitrite, UA: NEGATIVE
Protein, UA: NEGATIVE
RBC UA: NEGATIVE
SPEC GRAV UA: 1.01
UROBILINOGEN UA: 0.2
pH, UA: 7

## 2014-10-27 LAB — POCT GLYCOSYLATED HEMOGLOBIN (HGB A1C): HEMOGLOBIN A1C: 7.5

## 2014-10-27 MED ORDER — FLUCONAZOLE 150 MG PO TABS: 150.0000 mg | ORAL_TABLET | ORAL | Status: DC

## 2014-10-27 MED ORDER — OXYCODONE-ACETAMINOPHEN 10-325 MG PO TABS: 1.0000 | ORAL_TABLET | Freq: Four times a day (QID) | ORAL | Status: DC | PRN

## 2014-10-27 MED ORDER — CLONAZEPAM 1 MG PO TABS: 1.0000 mg | ORAL_TABLET | Freq: Two times a day (BID) | ORAL | Status: DC | PRN

## 2014-10-27 MED ORDER — SITAGLIPTIN PHOSPHATE 100 MG PO TABS: 100.0000 mg | ORAL_TABLET | Freq: Every day | ORAL | Status: DC

## 2014-10-27 MED ORDER — OXYCODONE-ACETAMINOPHEN 10-325 MG PO TABS: 1.0000 | ORAL_TABLET | Freq: Three times a day (TID) | ORAL | Status: DC | PRN

## 2014-10-27 MED ORDER — CLONAZEPAM 1 MG PO TABS
1.0000 mg | ORAL_TABLET | Freq: Two times a day (BID) | ORAL | Status: DC | PRN
Start: 2014-10-27 — End: 2014-12-21

## 2014-10-27 NOTE — Telephone Encounter (Signed)
Will forward to MD to clarify instructions on pain medication. Almira Phetteplace,CMA

## 2014-10-27 NOTE — Assessment & Plan Note (Signed)
Reports itching after starting antibiotic. Declined pelvic exam. No abdominal pain or tenderness. No fevers. Likely yeast infection. Will treat with diflucan. Advised that she needs to follow-up for pelvic exam if develops fevers, worsening discharge, no improvement, abdominal pain, nausea, or vomiting. She voiced understanding of this.

## 2014-10-27 NOTE — Assessment & Plan Note (Signed)
A1c worsened to 7.5. Has sulf allergy so can't add sulfonylurea. Will add [place]. F/u in one month.

## 2014-10-27 NOTE — Telephone Encounter (Signed)
Pt called because when she was seen today and given her prescriptions for pain medication the doctor put different instruction on the bottle and is worried about what the pharmacy will say and also what is the correct instructions. Please call. jw
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
## 2014-10-27 NOTE — Telephone Encounter (Signed)
Spoke with patient and informed her that once she is ready to fill that prescription the pharmacy can call and get the ok from [place] to change it to every 6 hours. Kailan Laws,CMA

## 2014-10-27 NOTE — Assessment & Plan Note (Addendum)
Following trauma to the area. No surrounding erythema or warmth to indicate infection. Given correlation with trauma could be granuloma, also could be a keratoacanthoma. Would be an odd place for a basal cell cancer. Discussed with Dr Lindell Noe who visualized the lesion as well, and decision was made to continue to monitor this area. If it grows bigger, changes in shape or color, or does not improve patient will return for biopsy of the lesion.

## 2014-10-27 NOTE — Patient Instructions (Signed)
Nice to see you. Please call Dr Gwenlyn Saran or [facility] to set up an appointment.  If you feel that the lesion on your leg is worsening please let us know. If you develop fevers, redness, worsening vaginal discharge, abdominal pain, nuasea, vomiting, or diarrhea let us know.  Seek medical attention if you develop thoughts of hurting yourself or others.

## 2014-10-27 NOTE — Telephone Encounter (Signed)
Instructions should be for every 6 hours as needed. There is one that states every 8 hours as needed, this should be changed to every 6 hours as needed.

## 2014-10-27 NOTE — Assessment & Plan Note (Signed)
Pain is stable. Will refill percocet.

## 2014-10-27 NOTE — Assessment & Plan Note (Signed)
Recently worsened with home stressors. No SI or HI. Therapy would be of benefit. Given Dr Arvid Right card and [facility] information. Will continue klonopin and zoloft. Given return precautions.

## 2014-10-28 NOTE — Progress Notes (Signed)
I was the preceptor on the day of this visit.   Jassiah Viviano MD  

## 2014-11-01 ENCOUNTER — Telehealth: Payer: Self-pay | Admitting: Psychology

## 2014-11-01 NOTE — Telephone Encounter (Signed)
[name] called to see if I was taking patients.  I am not.  She was given [employer] as a possibility as well.  I used the [employer] search engine and thought that Michelle Goldmann, PhD of [facility] might be a good Orthoptist for her.  Phone number is:  [phone].  I gave her that information and asked her to call me back if she ran into any difficulty.

## 2014-11-05 ENCOUNTER — Other Ambulatory Visit: Payer: Self-pay | Admitting: Family Medicine

## 2014-11-23 ENCOUNTER — Encounter: Payer: Self-pay | Admitting: Family Medicine

## 2014-11-23 ENCOUNTER — Ambulatory Visit (INDEPENDENT_AMBULATORY_CARE_PROVIDER_SITE_OTHER): Payer: Medicaid Other | Admitting: Family Medicine

## 2014-11-23 VITALS — BP 138/68 | HR 72 | Temp 98.5°F | Ht 63.0 in | Wt 163.0 lb

## 2014-11-23 DIAGNOSIS — L989 Disorder of the skin and subcutaneous tissue, unspecified: Secondary | ICD-10-CM | POA: Diagnosis not present

## 2014-11-23 DIAGNOSIS — D049 Carcinoma in situ of skin, unspecified: Secondary | ICD-10-CM

## 2014-11-23 NOTE — Progress Notes (Signed)
Patient ID: Michelle [name], female   DOB: [birth year], 64 y.o.   MRN: [id number] Shave Biopsy Procedure Note  Pre-operative Diagnosis: enlarging skin lesion  Post-operative Diagnosis: same  Locations: right anterior lower shin  Indications: pain and lesion is growing  Anesthesia: Lidocaine 1% with epinephrine   Procedure Details  History of allergy to iodine: no No known allergy to anesthetic.  Patient informed of the risks (including bleeding and infection) and benefits of the  procedure and Written informed consent obtained.  The lesion and surrounding area were given a sterile prep using betadyne. A dermablade was used to shave an area of skin approximately 1cm by 1cm.  Hemostasis achieved with pressure and silver nitrate. Antibiotic ointment and a sterile dressing applied.  The specimen was sent for pathologic examination. The patient tolerated the procedure well.  EBL: 1 ml  Condition: Stable  Complications: none.  Plan: 1. Instructed to keep the wound dry and covered for 24-48h and clean thereafter. 2. Warning signs of infection were reviewed.   3. Recommended that the patient use home percocet as needed for pain.  4. Return in 1 month. 5. Will call patient with results.

## 2014-11-23 NOTE — Addendum Note (Signed)
Addended by: Leone Haven on: 11/23/2014 03:29 PM   Modules accepted: Orders

## 2014-11-23 NOTE — Assessment & Plan Note (Signed)
Skin lesion has grown and become more painful. Biopsy done today. Will call with results of pathology.

## 2014-11-23 NOTE — Patient Instructions (Addendum)
Nice to see you. Please keep the area bandaged for the next 24 hours. Do not get the area wet during this time.  Tomorrow after 10:30 you can plan to take a shower, though do not submerge the area. You should keep a bandage on the area for 48 hours. If you develop redness, drainage, fever, or pain please seek medical attention.  We will call with the results.  Pulmonologist's name: Beverely Low, MD

## 2014-11-23 NOTE — Progress Notes (Signed)
Assigned preceptor backup for the morning.

## 2014-11-25 ENCOUNTER — Telehealth: Payer: Self-pay | Admitting: Family Medicine

## 2014-11-25 ENCOUNTER — Encounter: Payer: Self-pay | Admitting: Family Medicine

## 2014-11-25 DIAGNOSIS — D049 Carcinoma in situ of skin, unspecified: Secondary | ICD-10-CM

## 2014-11-25 HISTORY — DX: Carcinoma in situ of skin, unspecified: D04.9

## 2014-11-25 NOTE — Telephone Encounter (Signed)
Called patient to discuss biopsy results. Advised of finding of squamous cell carcinoma. Advised of sun exposure as risk factor. Discuss small possibility that it could metastasize, though most often is a local cancer. Advised that there would need to be more tissue taken off to get adequate margins, though she would need to see a dermatologist for this. Will refer to dermatology.

## 2014-11-29 ENCOUNTER — Telehealth: Payer: Self-pay | Admitting: Family Medicine

## 2014-11-29 NOTE — Telephone Encounter (Signed)
Will forward to referral coordinator to schedule sooner if possible. Farzad Tibbetts, CMA.

## 2014-11-29 NOTE — Telephone Encounter (Signed)
Is going out of town June 10-14. Would like to have dermatologist appt before the then Please advise

## 2014-11-30 NOTE — Telephone Encounter (Signed)
Called & advised patient that there is no dermatologist that accept her insurance in [place] and the options are between [facility] or [place]. Pt chose [facility] . I then advised her that appts are being scheduled out in July there. Patient okay with waiting.

## 2014-12-03 ENCOUNTER — Other Ambulatory Visit: Payer: Self-pay | Admitting: Family Medicine

## 2014-12-05 ENCOUNTER — Telehealth: Payer: Self-pay | Admitting: Family Medicine

## 2014-12-05 MED ORDER — BECLOMETHASONE DIPROPIONATE 40 MCG/ACT IN AERS: 2.0000 | INHALATION_SPRAY | Freq: Two times a day (BID) | RESPIRATORY_TRACT | Status: DC

## 2014-12-05 NOTE — Telephone Encounter (Signed)
Pt called and needs a refill on her QVAR sent in to her [place]. She also said that could you try and get her into the Dermatology office in [facility] instead of [place] . jw

## 2014-12-05 NOTE — Telephone Encounter (Signed)
Qvar sent in. Will send message to [place] to see about [place] for dermatology.

## 2014-12-06 NOTE — Telephone Encounter (Signed)
"   it doesn't matter where it is but wants to go to one has the soonest appt!

## 2014-12-06 NOTE — Telephone Encounter (Signed)
Called patient, would like to advise her that we can indeed change Dermatology offices but the office in [facility] is scheduling into Mid-July and she will not get an earlier appt. Please let me know if patient still wants to change. Thanks

## 2014-12-21 ENCOUNTER — Encounter: Payer: Self-pay | Admitting: Family Medicine

## 2014-12-21 ENCOUNTER — Ambulatory Visit (INDEPENDENT_AMBULATORY_CARE_PROVIDER_SITE_OTHER): Payer: Medicaid Other | Admitting: Family Medicine

## 2014-12-21 VITALS — BP 138/77 | HR 89 | Temp 98.2°F | Ht 63.0 in | Wt 163.0 lb

## 2014-12-21 DIAGNOSIS — M545 Low back pain, unspecified: Secondary | ICD-10-CM

## 2014-12-21 DIAGNOSIS — F418 Other specified anxiety disorders: Secondary | ICD-10-CM | POA: Diagnosis not present

## 2014-12-21 DIAGNOSIS — D049 Carcinoma in situ of skin, unspecified: Secondary | ICD-10-CM

## 2014-12-21 DIAGNOSIS — F32A Depression, unspecified: Secondary | ICD-10-CM

## 2014-12-21 DIAGNOSIS — F419 Anxiety disorder, unspecified: Principal | ICD-10-CM

## 2014-12-21 DIAGNOSIS — F329 Major depressive disorder, single episode, unspecified: Secondary | ICD-10-CM

## 2014-12-21 MED ORDER — OXYCODONE-ACETAMINOPHEN 10-325 MG PO TABS: 1.0000 | ORAL_TABLET | Freq: Four times a day (QID) | ORAL | Status: DC | PRN

## 2014-12-21 MED ORDER — CLONAZEPAM 1 MG PO TABS: 1.0000 mg | ORAL_TABLET | Freq: Two times a day (BID) | ORAL | Status: DC | PRN

## 2014-12-21 NOTE — Patient Instructions (Signed)
Nice to see you. We will refill your pain and anxiety medications. You can use heat and pain medication for your back pain.  You can also do the provided exercises for your back pain.  If you develop fever, weakness, numbness, loss of bowel or bladder function, or worsening pain please seek medical attention.

## 2014-12-23 DIAGNOSIS — M545 Low back pain, unspecified: Secondary | ICD-10-CM | POA: Insufficient documentation

## 2014-12-23 NOTE — Assessment & Plan Note (Signed)
Skin lesion has come back. Patient has dermatology appointment next week for further management.

## 2014-12-23 NOTE — Progress Notes (Signed)
Patient ID: Michelle [name], female   DOB: 06/16/1951, 64 y.o.   MRN: [id number]  Tommi Rumps, MD Phone: [phone]  Michelle [name] is a 64 y.o. female who presents today for f/u.  Anxiety: notes this is better than previously. Is 30% better. Not tearful anymore. Notes is most anxious about the skin cancer that was found at last visit. Denies depression. Taking klonopin 2x/day, though some days does not take this. Also taking zoloft.  RA: notes continued pain. Pain is worst in low back at this time. Hurts most if she twists or bends wrong. Is an sharp pulling pain. Denies fever, weakness, numbness, loss of bowel or bladder function. Has history of skin cancer. Has had low back pain intermittently for years.  Squamous cell carcinoma of the skin: patient notes the nodule returned and continued to cause pain. Has appointment with derm next week.   PMH: former smoker.   ROS: Per HPI   Physical Exam Filed Vitals:   12/21/14 0830  BP: 138/77  Pulse: 89  Temp: 98.2 F (36.8 C)    Gen: Well NAD HEENT: PERRL,  MMM Lungs: CTABL Nl WOB Heart: RRR, no murmur appreciated MSK: no midline spine tenderness, no step off, no muscular tenderness, no swelling Neuro: 5/5 strength in bilateral quads, hamstrings, plantar and dorsiflexion, sensation to light touch intact in bilateral LE, normal gait, 2+ patellar reflexes Exts: Non edematous BL  LE, warm and well perfused.  Skin: right anterior lower shin 1 x 1 cm firm nodule just superior to area of biopsy with mild overlying erythema, no surrounding erythema, no fluctuance, no warmth   Assessment/Plan: Please see individual problem list.  Tommi Rumps, MD [place] PGY-3

## 2014-12-23 NOTE — Assessment & Plan Note (Signed)
Patient reports this is a chronic issue. Likely muscular strain given history of worse with bending and twisting. Neurologically intact in LE. Only red flag is skin cancer history. Discussed obtaining XR of this given persistence and patient declined. Will do exercises. Continue pain regimen. Given return precautions.

## 2014-12-23 NOTE — Assessment & Plan Note (Signed)
Is improved from last office visit. No depression. Will continue current regimen.

## 2014-12-26 DIAGNOSIS — C4492 Squamous cell carcinoma of skin, unspecified: Secondary | ICD-10-CM

## 2014-12-26 HISTORY — DX: Squamous cell carcinoma of skin, unspecified: C44.92

## 2014-12-27 ENCOUNTER — Other Ambulatory Visit: Payer: Self-pay | Admitting: Family Medicine

## 2014-12-28 ENCOUNTER — Telehealth: Payer: Self-pay | Admitting: Family Medicine

## 2014-12-28 MED ORDER — GLUCOSE BLOOD VI STRP: ORAL_STRIP | Status: DC

## 2014-12-28 NOTE — Telephone Encounter (Signed)
Will forward to MD to send in new rx. Shekelia Boutin,CMA

## 2014-12-28 NOTE — Telephone Encounter (Signed)
New prescription sent to pharmacy 

## 2014-12-28 NOTE — Telephone Encounter (Signed)
Pt called because her test strips were called in at once a day when she really check three times a day. We need a new prescription sent in. jw

## 2014-12-29 NOTE — Telephone Encounter (Signed)
Patient advised and verbalized understanding. Kenni Newton, CMA.

## 2015-01-19 ENCOUNTER — Ambulatory Visit (INDEPENDENT_AMBULATORY_CARE_PROVIDER_SITE_OTHER): Payer: Medicaid Other | Admitting: Student

## 2015-01-19 VITALS — BP 142/70 | HR 102 | Temp 98.2°F | Ht 63.0 in | Wt 149.0 lb

## 2015-01-19 DIAGNOSIS — D049 Carcinoma in situ of skin, unspecified: Secondary | ICD-10-CM

## 2015-01-19 DIAGNOSIS — E119 Type 2 diabetes mellitus without complications: Secondary | ICD-10-CM | POA: Diagnosis not present

## 2015-01-19 DIAGNOSIS — M069 Rheumatoid arthritis, unspecified: Secondary | ICD-10-CM | POA: Diagnosis not present

## 2015-01-19 LAB — POCT GLYCOSYLATED HEMOGLOBIN (HGB A1C): Hemoglobin A1C: 7.2

## 2015-01-19 MED ORDER — CLONAZEPAM 1 MG PO TABS: 1.0000 mg | ORAL_TABLET | Freq: Two times a day (BID) | ORAL | Status: DC | PRN

## 2015-01-19 MED ORDER — OXYCODONE-ACETAMINOPHEN 10-325 MG PO TABS: 1.0000 | ORAL_TABLET | Freq: Four times a day (QID) | ORAL | Status: DC | PRN

## 2015-01-19 MED ORDER — TRIAMCINOLONE ACETONIDE 0.1 % EX CREA: TOPICAL_CREAM | CUTANEOUS | Status: DC

## 2015-01-19 NOTE — Assessment & Plan Note (Signed)
Hip and Rib pain, potentially due to RA as acute fracture or injury have been ruled out. Unlikely secondary to muscle strain as have not been associated with movement or straining  Percocet and clonipin refilled today for 3 months Reviewed return precautions for rib pain

## 2015-01-19 NOTE — Patient Instructions (Addendum)
Return in 3 months for A1c and pain medication refill If you develop shortness of breath or worsening pain please call the office

## 2015-01-19 NOTE — Assessment & Plan Note (Signed)
Dermatology excised it and is currently following

## 2015-01-19 NOTE — Assessment & Plan Note (Signed)
A1c improved to 7.2 with addition of Januvia  - Will continue Januvia and metformin - Lifestyle modifications reviewed

## 2015-01-19 NOTE — Progress Notes (Signed)
   Subjective:    Patient ID: Michelle [name], female    DOB: March 11, 1951, 64 y.o.   MRN: [id number]   CC: Follow up DM, skin lesion  HPI 64 y/o with PMH of DM with recent excisional biopsy of painful leg lesion on 5/25. Path showed Squamus cell carcinoma with kerato-acanthoma pattern  DM A1c 7.2 today. Takes Januvia and metformin at home   RA - Recently been seen at [facility] to follow RA - Reports hip and rib pain. The rib pain started on 7/19 when she bent to get her purse. Now the pain ins constant with no exacerbation with stretching or movement,. Has not noted any rashes. She had a CXR and pelvic xray which were negative for acute pathology - Requests refill of her clonipin and percocet  SCC of leg Seen by dermatology who took a larger excision. Pathology of excision done with our clinic did not delineate margins. They are following her for this  Chronic Cough Has had chronic cough for approximately 2 months. Denies fever, sputum or SOB. Has had cough like this in past associated with lung injury secondary to MTX toxicity. She is followed by pulmonology and had a recent chest xray which was negative. She will see Pulmonology in early August   Review of Systems   See HPI for ROS.   Past medical history, surgical, family, and social history reviewed and updated in the EMR as appropriate.  Past Medical History  Diagnosis Date  . RA (rheumatoid arthritis)     CCP positive followed by [facility]  . HLD (hyperlipidemia)   . Diabetes mellitus   . Asthma   . Allergy   . Anxiety   . Cataract     beginning on left eye  . GERD (gastroesophageal reflux disease)   . Osteoporosis   . Diverticulosis   . Lung nodules    History   Social History  . Marital Status: Married    Spouse Name: N/A  . Number of Children: 3  . Years of Education: N/A   Occupational History  . disabled   .     Social History Main Topics  . Smoking status: Former Smoker -- 1.50 packs/day for 5 years   Types: Cigarettes    Quit date: 05/31/1994  . Smokeless tobacco: Never Used  . Alcohol Use: No  . Drug Use: No  . Sexual Activity: Not on file   Other Topics Concern  . Not on file   Social History Narrative   Pt is on [employer] [place]   Lots of family stressors including grandaughter who lives with her          Objective:  BP 142/70 mmHg  Pulse 102  Temp(Src) 98.2 F (36.8 C) (Oral)  Ht 5\' 3"  (1.6 m)  Wt 149 lb (67.586 kg)  BMI 26.40 kg/m2 Vitals and nursing note reviewed  General: NAD Cardiac: RRR, normal heart sounds,  Respiratory: CTAB, normal effort Abdomen: soft, nontender, nondistended, no hepatic or splenomegaly. Bowel sounds present Extremities: no edema or cyanosis. Right well healed biopsy site over shin. WWP. Skin: warm and dry, no rashes noted, well healed lesion as above Neuro: alert and oriented, no focal deficits Musculoskeletal: TTP over right rib cage, no point tenderness to any one rib, just general tenderness  Assessment & Plan:  See Problem List      Lexianna Weinrich A. Lincoln Brigham MD, [place] [facility] Resident PGY-1 Pager [phone]

## 2015-01-22 ENCOUNTER — Other Ambulatory Visit: Payer: Self-pay | Admitting: Family Medicine

## 2015-01-22 NOTE — Telephone Encounter (Signed)
Please see Rx request, sent to our office in error.  

## 2015-01-23 ENCOUNTER — Telehealth: Payer: Self-pay | Admitting: *Deleted

## 2015-01-23 ENCOUNTER — Telehealth: Payer: Self-pay | Admitting: Student

## 2015-01-23 ENCOUNTER — Other Ambulatory Visit: Payer: Self-pay | Admitting: Family Medicine

## 2015-01-23 MED ORDER — ALBUTEROL SULFATE HFA 108 (90 BASE) MCG/ACT IN AERS: INHALATION_SPRAY | RESPIRATORY_TRACT | Status: DC

## 2015-01-23 NOTE — Telephone Encounter (Signed)
Prior Authorization received from [employer] for Crestor 10 mg and Xyzal 5 mg. Formulary and PA form placed in provider box for completion. Derl Barrow, RN

## 2015-01-23 NOTE — Telephone Encounter (Signed)
Proair refilled. Please inform pt  thanks  Germain Koopmann A. Lincoln Brigham MD, [place] [facility] Resident PGY-2 Pager [phone]

## 2015-01-23 NOTE — Telephone Encounter (Signed)
Patient is aware of this. Michelle [name],CMA  

## 2015-01-23 NOTE — Telephone Encounter (Signed)
Refill just received today by PCP.  Jazmin Hartsell,CMA

## 2015-01-23 NOTE — Telephone Encounter (Signed)
Needs refill on nexium

## 2015-01-23 NOTE — Telephone Encounter (Signed)
Patient calling to check on the status of her refill requests. She states that she was expecting her ProAir by today. Sadie Reynolds, ASA

## 2015-01-23 NOTE — Telephone Encounter (Signed)
Pt is completely out of her [employer]. She desperately needs this because of the heat

## 2015-01-24 ENCOUNTER — Other Ambulatory Visit: Payer: Self-pay | Admitting: *Deleted

## 2015-01-24 ENCOUNTER — Telehealth: Payer: Self-pay | Admitting: Student

## 2015-01-24 NOTE — Telephone Encounter (Signed)
Returned call to Eslam at [facility].  Needed to know frequency of ProAir Inhaler.  Last written Rx was for every 4 hours.  Patient was able to pickup 2 inhalers for 34 day supply and insurance will cover it.  Derl Barrow, RN

## 2015-01-24 NOTE — Telephone Encounter (Signed)
Will forward to RN team. Michelle [name],CMA  

## 2015-01-24 NOTE — Telephone Encounter (Signed)
Eslam from [place] on [address] is calling because he needs to verify information for Rx Proair inhaler for the pt. Please contact him at the number provided at the earliest convenience. Thank you, Fonda Kinder, ASA

## 2015-01-25 NOTE — Telephone Encounter (Signed)
Received PA approval for Crestor 10 mg via [employer].  Med approved for 01/25/15 - 01/25/16.  [facility] pharmacy informed.  PA approval number [id number]. Derl Barrow, RN

## 2015-01-26 MED ORDER — CETIRIZINE HCL 10 MG PO TABS: 10.0000 mg | ORAL_TABLET | Freq: Every day | ORAL | Status: DC

## 2015-01-26 MED ORDER — NEXIUM 40 MG PO CPDR: 40.0000 mg | DELAYED_RELEASE_CAPSULE | Freq: Two times a day (BID) | ORAL | Status: DC

## 2015-01-26 MED ORDER — SIMVASTATIN 20 MG PO TABS: 20.0000 mg | ORAL_TABLET | Freq: Every day | ORAL | Status: DC

## 2015-01-26 MED ORDER — MOMETASONE FUROATE 50 MCG/ACT NA SUSP: NASAL | Status: DC

## 2015-01-26 MED ORDER — ROSUVASTATIN CALCIUM 10 MG PO TABS: 10.0000 mg | ORAL_TABLET | Freq: Every day | ORAL | Status: DC

## 2015-01-26 NOTE — Telephone Encounter (Signed)
Please inform pt that Xyzal was changed to cetirizine, crestor was approved and requested meds refilled. Thanks  Shuree Brossart A. Lincoln Brigham MD, [place] [facility] Resident PGY-2 Pager [phone]

## 2015-01-30 ENCOUNTER — Other Ambulatory Visit: Payer: Self-pay | Admitting: Student

## 2015-01-30 NOTE — Progress Notes (Signed)
Pt ordered for cetirizine. Xyzal discontinued as it is  not covered by insurance Please inform pt. Thanks  Makenlee Mckeag A. Lincoln Brigham MD, [place] [facility] Resident PGY-2 Pager [phone]

## 2015-02-10 ENCOUNTER — Ambulatory Visit
Admission: RE | Admit: 2015-02-10 | Discharge: 2015-02-10 | Disposition: A | Discharge status: Home / Self Care | Payer: Medicaid Other | Source: Ambulatory Visit | Attending: Family Medicine | Admitting: Family Medicine

## 2015-02-10 ENCOUNTER — Encounter (INDEPENDENT_AMBULATORY_CARE_PROVIDER_SITE_OTHER): Payer: Self-pay

## 2015-02-10 ENCOUNTER — Ambulatory Visit (INDEPENDENT_AMBULATORY_CARE_PROVIDER_SITE_OTHER): Payer: Medicaid Other | Admitting: Family Medicine

## 2015-02-10 VITALS — BP 133/82 | HR 102 | Temp 98.2°F | Wt 158.4 lb

## 2015-02-10 DIAGNOSIS — M79604 Pain in right leg: Secondary | ICD-10-CM | POA: Diagnosis not present

## 2015-02-10 NOTE — Patient Instructions (Addendum)
Thank you for coming to see me today. It was a pleasure. Today we talked about:   Right leg pain: I am unsure of what is causing this but you might have a small fracture in your leg so I will get an x-ray. This may not see early fractures, but I think it is worth getting. In the interim, please refrain from movements that make your pain worse.  If you have any questions or concerns, please do not hesitate to call the office at [phone].  Sincerely,  Cordelia Poche, MD

## 2015-02-10 NOTE — Progress Notes (Signed)
    Subjective   Michelle [name] is a 64 y.o. female that presents for a same day visit  1. Right leg pain: Symptoms started 5 days ago. Leg pain is located to her leg. Pain is sharp and throughout leg and in her knee. Worse when she walks. She has been taking Percocet and Aleve with addition of a heating pad which does not make symptoms better. No has no fevers, nausea, vomiting. She has been having She does endorse having a fall about two weeks ago but does not recall injuring her leg.  ROS Per HPI  Social History  Substance Use Topics  . Smoking status: Former Smoker -- 1.50 packs/day for 5 years    Types: Cigarettes    Quit date: 05/31/1994  . Smokeless tobacco: Never Used  . Alcohol Use: No    Allergies  Allergen Reactions  . Sulfonamide Derivatives Hives and Itching  . Ace Inhibitors Cough  . Sulfa Antibiotics     Other reaction(s): ITCHING    Objective   BP 133/82 mmHg  Pulse 102  Temp(Src) 98.2 F (36.8 C) (Oral)  Wt 158 lb 6.4 oz (71.85 kg)  General: Well appearing, no distress Musculoskeletal: Multiple small bruises on right leg. No swelling or erythema. Point tenderness over mid-tibia. Right knee appears normal with negative patellar apprehension test, full ROM, negative lachman, anterior/posterior drawer, mcmurray with no joint line tenderness. 2+ DP pulses  Assessment and Plan   No orders of the defined types were placed in this encounter.    Leg pain: possible stress fracture with recent fall  Plain x-ray  Continue analgesics  Weight bearing as tolerated

## 2015-02-13 ENCOUNTER — Encounter: Payer: Self-pay | Admitting: Family Medicine

## 2015-02-13 ENCOUNTER — Ambulatory Visit (INDEPENDENT_AMBULATORY_CARE_PROVIDER_SITE_OTHER): Payer: Medicaid Other | Admitting: Family Medicine

## 2015-02-13 VITALS — BP 125/81 | HR 109 | Temp 98.2°F | Ht 63.0 in | Wt 156.6 lb

## 2015-02-13 DIAGNOSIS — R399 Unspecified symptoms and signs involving the genitourinary system: Secondary | ICD-10-CM | POA: Insufficient documentation

## 2015-02-13 DIAGNOSIS — R350 Frequency of micturition: Secondary | ICD-10-CM | POA: Diagnosis not present

## 2015-02-13 DIAGNOSIS — N390 Urinary tract infection, site not specified: Secondary | ICD-10-CM

## 2015-02-13 LAB — BASIC METABOLIC PANEL WITH GFR
BUN: 11 mg/dL (ref 7–25)
CALCIUM: 9.3 mg/dL (ref 8.6–10.4)
CO2: 22 mmol/L (ref 20–31)
CREATININE: 0.65 mg/dL (ref 0.50–0.99)
Chloride: 101 mmol/L (ref 98–110)
GFR, Est Non African American: >89 mL/min (ref >=60)
GLUCOSE: 138 mg/dL — AB (ref 65–99)
Potassium: 4.1 mmol/L (ref 3.5–5.3)
Sodium: 136 mmol/L (ref 135–146)

## 2015-02-13 LAB — POCT URINALYSIS DIPSTICK
GLUCOSE UA: NEGATIVE
Nitrite, UA: POSITIVE
Protein, UA: 100
Urobilinogen, UA: 0.2
pH, UA: 6

## 2015-02-13 LAB — CBC WITH DIFFERENTIAL/PLATELET
BASOS PCT: 0 % (ref 0–1)
Basophils Absolute: 0 10*3/uL (ref 0.0–0.1)
EOS ABS: 0.5 10*3/uL (ref 0.0–0.7)
EOS PCT: 5 % (ref 0–5)
HCT: 38.9 % (ref 36.0–46.0)
HEMOGLOBIN: 12.9 g/dL (ref 12.0–15.0)
Lymphocytes Relative: 21 % (ref 12–46)
Lymphs Abs: 2.1 10*3/uL (ref 0.7–4.0)
MCH: 29.1 pg (ref 26.0–34.0)
MCHC: 33.2 g/dL (ref 30.0–36.0)
MCV: 87.8 fL (ref 78.0–100.0)
MONO ABS: 1 10*3/uL (ref 0.1–1.0)
MONOS PCT: 10 % (ref 3–12)
MPV: 9.4 fL (ref 8.6–12.4)
Neutro Abs: 6.3 10*3/uL (ref 1.7–7.7)
Neutrophils Relative %: 64 % (ref 43–77)
PLATELETS: 262 10*3/uL (ref 150–400)
RBC: 4.43 MIL/uL (ref 3.87–5.11)
RDW: 14.2 % (ref 11.5–15.5)
WBC: 9.8 10*3/uL (ref 4.0–10.5)

## 2015-02-13 MED ORDER — CEPHALEXIN 500 MG PO CAPS: 500.0000 mg | ORAL_CAPSULE | Freq: Three times a day (TID) | ORAL | Status: DC

## 2015-02-13 NOTE — Assessment & Plan Note (Signed)
History and UA consistent with UTI - Keflex 500 mg 3 times a day for 7 days; given CVA tenderness  - Urine culture - Check BMP and CBC

## 2015-02-13 NOTE — Progress Notes (Signed)
   Subjective:    Patient ID: Michelle [name], female    DOB: 1951/03/14, 64 y.o.   MRN: [id number]  Seen for Same day visit for   CC: dysuria  DYSURIA  Pain urinating started 1 days ago. Pain is: Suprapubic Medications tried: None Any antibiotics in the last 30 days: None More than 3 UTIs in the last 12 months: No STD exposure: No Possibly pregnant: No  Symptoms Urgency: Yes Frequency: Yes Blood in urine: No  Pain in back: Left-sided flank pain Fever: Denies Vaginal discharge: Denies Mouth Ulcers: Denies  Review of Symptoms - see HPI PMH - Smoking status noted.    Review of Systems   See HPI for ROS. Objective:  BP 125/81 mmHg  Pulse 109  Temp(Src) 98.2 F (36.8 C) (Oral)  Ht 5\' 3"  (1.6 m)  Wt 156 lb 9 oz (71.016 kg)  BMI 27.74 kg/m2  General: NAD Cardiac: RRR, normal heart sounds, no murmurs. 2+ radial and PT pulses bilaterally Respiratory: CTAB, normal effort Abdomen: soft, mild suprapubic tenderness; mild left CVA tenderness Extremities: no edema or cyanosis. WWP. Skin: warm and dry, no rashes noted    Assessment & Plan:  See Problem List Documentation

## 2015-02-13 NOTE — Patient Instructions (Signed)
I have order some labs today to check your kidney function. I will send you a letter with the results, or call you if we need to make any changes to your current therapies.  Take Keflex 500 mg three times a day for 1 week.    Urinary Tract Infection Urinary tract infections (UTIs) can develop anywhere along your urinary tract. Your urinary tract is your body's drainage system for removing wastes and extra water. Your urinary tract includes two kidneys, two ureters, a bladder, and a urethra. Your kidneys are a pair of bean-shaped organs. Each kidney is about the size of your fist. They are located below your ribs, one on each side of your spine. CAUSES Infections are caused by microbes, which are microscopic organisms, including fungi, viruses, and bacteria. These organisms are so small that they can only be seen through a microscope. Bacteria are the microbes that most commonly cause UTIs. SYMPTOMS  Symptoms of UTIs may vary by age and gender of the patient and by the location of the infection. Symptoms in young women typically include a frequent and intense urge to urinate and a painful, burning feeling in the bladder or urethra during urination. Older women and men are more likely to be tired, shaky, and weak and have muscle aches and abdominal pain. A fever may mean the infection is in your kidneys. Other symptoms of a kidney infection include pain in your back or sides below the ribs, nausea, and vomiting. DIAGNOSIS To diagnose a UTI, your caregiver will ask you about your symptoms. Your caregiver also will ask to provide a urine sample. The urine sample will be tested for bacteria and white blood cells. White blood cells are made by your body to help fight infection. TREATMENT  Typically, UTIs can be treated with medication. Because most UTIs are caused by a bacterial infection, they usually can be treated with the use of antibiotics. The choice of antibiotic and length of treatment depend on your  symptoms and the type of bacteria causing your infection. HOME CARE INSTRUCTIONS  If you were prescribed antibiotics, take them exactly as your caregiver instructs you. Finish the medication even if you feel better after you have only taken some of the medication.  Drink enough water and fluids to keep your urine clear or pale yellow.  Avoid caffeine, tea, and carbonated beverages. They tend to irritate your bladder.  Empty your bladder often. Avoid holding urine for long periods of time.  Empty your bladder before and after sexual intercourse.  After a bowel movement, women should cleanse from front to back. Use each tissue only once. SEEK MEDICAL CARE IF:   You have back pain.  You develop a fever.  Your symptoms do not begin to resolve within 3 days. SEEK IMMEDIATE MEDICAL CARE IF:   You have severe back pain or lower abdominal pain.  You develop chills.  You have nausea or vomiting.  You have continued burning or discomfort with urination. MAKE SURE YOU:   Understand these instructions.  Will watch your condition.  Will get help right away if you are not doing well or get worse. Document Released: 03/27/2005 Document Revised: 12/17/2011 Document Reviewed: 07/26/2011 [facility] Patient Information 2015 [place], [place]. This information is not intended to replace advice given to you by your health care provider. Make sure you discuss any questions you have with your health care provider.

## 2015-02-14 ENCOUNTER — Telehealth: Payer: Self-pay | Admitting: Family Medicine

## 2015-02-14 NOTE — Telephone Encounter (Signed)
Called to discuss results of Tib/Fib. Results negative. Patient feels issue is more in her knee now. Will check with [place] for follow-up.

## 2015-02-15 ENCOUNTER — Telehealth: Payer: Self-pay | Admitting: Student

## 2015-02-15 DIAGNOSIS — M79604 Pain in right leg: Secondary | ICD-10-CM

## 2015-02-15 DIAGNOSIS — Z9889 Other specified postprocedural states: Secondary | ICD-10-CM

## 2015-02-15 NOTE — Telephone Encounter (Signed)
In regards to her last visit with Dr. Lonny Prude, pt needs referral placed for Dr. Grandville Silos located at [facility]. Phone: [phone]. Michelle [name], ASA

## 2015-02-15 NOTE — Telephone Encounter (Signed)
Will forward to Dr. Lonny Prude since he saw pt for this for review. Wilmoth Rasnic, CMA.

## 2015-02-19 ENCOUNTER — Other Ambulatory Visit: Payer: Self-pay | Admitting: Family Medicine

## 2015-02-20 ENCOUNTER — Other Ambulatory Visit: Payer: Self-pay | Admitting: *Deleted

## 2015-02-22 MED ORDER — SITAGLIPTIN PHOSPHATE 100 MG PO TABS: 100.0000 mg | ORAL_TABLET | Freq: Every day | ORAL | Status: DC

## 2015-02-22 NOTE — Telephone Encounter (Signed)
Januvia refilled. Please inform the patient  Thanks  Kateri Balch A. Lincoln Brigham MD, [facility] Resident PGY-2 Pager [phone]

## 2015-03-02 ENCOUNTER — Other Ambulatory Visit: Payer: Self-pay | Admitting: Dermatology

## 2015-03-02 DIAGNOSIS — M79661 Pain in right lower leg: Secondary | ICD-10-CM

## 2015-03-03 ENCOUNTER — Ambulatory Visit
Admission: RE | Admit: 2015-03-03 | Discharge: 2015-03-03 | Disposition: A | Discharge status: Home / Self Care | Payer: Medicaid Other | Source: Ambulatory Visit | Attending: Dermatology | Admitting: Dermatology

## 2015-03-03 DIAGNOSIS — M7121 Synovial cyst of popliteal space [Baker], right knee: Secondary | ICD-10-CM | POA: Insufficient documentation

## 2015-03-03 DIAGNOSIS — M79661 Pain in right lower leg: Secondary | ICD-10-CM

## 2015-03-17 ENCOUNTER — Other Ambulatory Visit: Payer: Self-pay | Admitting: Family Medicine

## 2015-03-20 ENCOUNTER — Other Ambulatory Visit: Payer: Self-pay | Admitting: *Deleted

## 2015-03-20 MED ORDER — METFORMIN HCL 1000 MG PO TABS: 1000.0000 mg | ORAL_TABLET | Freq: Two times a day (BID) | ORAL | Status: DC

## 2015-03-20 NOTE — Telephone Encounter (Signed)
Rx filled.  Michelle [name]. Jerline Pain, [place] Resident PGY-2 03/20/2015 5:14 PM

## 2015-04-09 DIAGNOSIS — N390 Urinary tract infection, site not specified: Secondary | ICD-10-CM | POA: Diagnosis not present

## 2015-04-09 DIAGNOSIS — I10 Essential (primary) hypertension: Secondary | ICD-10-CM | POA: Diagnosis not present

## 2015-04-12 DIAGNOSIS — J45991 Cough variant asthma: Secondary | ICD-10-CM | POA: Diagnosis not present

## 2015-04-12 DIAGNOSIS — Z23 Encounter for immunization: Secondary | ICD-10-CM | POA: Diagnosis not present

## 2015-04-17 ENCOUNTER — Other Ambulatory Visit: Payer: Self-pay | Admitting: *Deleted

## 2015-04-17 DIAGNOSIS — E119 Type 2 diabetes mellitus without complications: Secondary | ICD-10-CM

## 2015-04-18 MED ORDER — GLUCOSE BLOOD VI STRP: ORAL_STRIP | Status: DC

## 2015-04-18 NOTE — Telephone Encounter (Signed)
Testing strips refilled   Stephan Nelis A. Lincoln Brigham MD, [facility] Resident PGY-2 Pager [phone]

## 2015-04-19 DIAGNOSIS — M545 Low back pain: Secondary | ICD-10-CM | POA: Diagnosis not present

## 2015-04-19 DIAGNOSIS — Z79899 Other long term (current) drug therapy: Secondary | ICD-10-CM | POA: Diagnosis not present

## 2015-04-19 DIAGNOSIS — Z7982 Long term (current) use of aspirin: Secondary | ICD-10-CM | POA: Diagnosis not present

## 2015-04-19 DIAGNOSIS — J45991 Cough variant asthma: Secondary | ICD-10-CM | POA: Diagnosis not present

## 2015-04-19 DIAGNOSIS — M1990 Unspecified osteoarthritis, unspecified site: Secondary | ICD-10-CM | POA: Diagnosis not present

## 2015-04-19 DIAGNOSIS — Z5181 Encounter for therapeutic drug level monitoring: Secondary | ICD-10-CM | POA: Diagnosis not present

## 2015-04-19 DIAGNOSIS — M5440 Lumbago with sciatica, unspecified side: Secondary | ICD-10-CM | POA: Diagnosis not present

## 2015-04-19 DIAGNOSIS — M0579 Rheumatoid arthritis with rheumatoid factor of multiple sites without organ or systems involvement: Secondary | ICD-10-CM | POA: Diagnosis not present

## 2015-04-19 DIAGNOSIS — R05 Cough: Secondary | ICD-10-CM | POA: Diagnosis not present

## 2015-04-20 ENCOUNTER — Encounter: Payer: Self-pay | Admitting: Student

## 2015-04-20 ENCOUNTER — Ambulatory Visit (INDEPENDENT_AMBULATORY_CARE_PROVIDER_SITE_OTHER): Payer: Medicare Other | Admitting: Student

## 2015-04-20 VITALS — BP 128/79 | HR 96 | Temp 98.3°F | Ht 63.0 in | Wt 158.0 lb

## 2015-04-20 DIAGNOSIS — E119 Type 2 diabetes mellitus without complications: Secondary | ICD-10-CM | POA: Diagnosis present

## 2015-04-20 DIAGNOSIS — Z7189 Other specified counseling: Secondary | ICD-10-CM

## 2015-04-20 DIAGNOSIS — G8929 Other chronic pain: Secondary | ICD-10-CM

## 2015-04-20 LAB — POCT GLYCOSYLATED HEMOGLOBIN (HGB A1C): Hemoglobin A1C: 7

## 2015-04-20 MED ORDER — CLONAZEPAM 1 MG PO TABS: 1.0000 mg | ORAL_TABLET | Freq: Two times a day (BID) | ORAL | Status: DC | PRN

## 2015-04-20 MED ORDER — ALBUTEROL SULFATE (2.5 MG/3ML) 0.083% IN NEBU: 2.5000 mg | INHALATION_SOLUTION | Freq: Four times a day (QID) | RESPIRATORY_TRACT | Status: DC | PRN

## 2015-04-20 MED ORDER — OXYCODONE-ACETAMINOPHEN 10-325 MG PO TABS: 1.0000 | ORAL_TABLET | Freq: Four times a day (QID) | ORAL | Status: DC | PRN

## 2015-04-20 MED ORDER — GLUCOSE BLOOD VI STRP: ORAL_STRIP | Status: DC

## 2015-04-20 MED ORDER — ONETOUCH ULTRA 2 W/DEVICE KIT: 1.0000 | PACK | Freq: Once | Status: DC

## 2015-04-20 MED ORDER — OMEPRAZOLE 20 MG PO CPDR: 20.0000 mg | DELAYED_RELEASE_CAPSULE | Freq: Every day | ORAL | Status: DC

## 2015-04-20 MED ORDER — ONETOUCH ULTRASOFT LANCETS MISC
Status: DC
Start: 2015-04-20 — End: 2015-11-15

## 2015-04-20 NOTE — Patient Instructions (Addendum)
Return in 3 months Call your pharmacist and discuss Januvia therapy as you have an allergy to Glipizide If you have questions or concerns please call the office

## 2015-04-20 NOTE — Progress Notes (Signed)
Subjective:    Patient ID: Michelle [name], female    DOB: [birth year], 64 y.o.   MRN: [id number]   CC: Med refill  HPI:  64 y/o with DM presents for med refill. She has just got Medicare and found that some of her meds are not covered by medicare and would like transition to versions that are covered by medicare.  She otherwise denies any other issues  DM A1c today 7.0, was 7.2 on last check. Reports eating healthfully, always wearing protective foot wear. Denies SOB, chest pain, recent fevers or chills  Review of Systems   See HPI for ROS.   Past Medical History  Diagnosis Date  . RA (rheumatoid arthritis) (HCC)     CCP positive followed by [facility]  . HLD (hyperlipidemia)   . Diabetes mellitus   . Asthma   . Allergy   . Anxiety   . Cataract     beginning on left eye  . GERD (gastroesophageal reflux disease)   . Osteoporosis   . Diverticulosis   . Lung nodules    Past Surgical History  Procedure Laterality Date  . Appendectomy    . Cholecystectomy    . Abdominal hysterectomy    . Carpal tunnel release    . Knee surgery      x2  . Hernia repair    . Shoulder surgery      x2  . Right foot bone spurs removed    . Left and right heart catheterization with coronary angiogram N/A 08/17/2013    Procedure: LEFT AND RIGHT HEART CATHETERIZATION WITH CORONARY ANGIOGRAM;  Surgeon: Laverda Page, MD;  Location: [facility] CATH LAB;  Service: Cardiovascular;  Laterality: N/A;   OB History    No data available     Social History   Social History  . Marital Status: Married    Spouse Name: N/A  . Number of Children: 3  . Years of Education: N/A   Occupational History  . disabled   .     Social History Main Topics  . Smoking status: Former Smoker -- 1.50 packs/day for 5 years    Types: Cigarettes    Quit date: 05/31/1994  . Smokeless tobacco: Never Used  . Alcohol Use: No  . Drug Use: No  . Sexual Activity: Not on file   Other Topics Concern  . Not on file    Social History Narrative   Pt is on [employer] [place]   Lots of family stressors including grandaughter who lives with her          Objective:  BP 128/79 mmHg  Pulse 96  Temp(Src) 98.3 F (36.8 C) (Oral)  Ht 5\' 3"  (1.6 m)  Wt 158 lb (71.668 kg)  BMI 28.00 kg/m2  SpO2 97% Vitals and nursing note reviewed  General: NAD Cardiac: RRR,  Respiratory: CTAB, normal effort Extremities: See diabetic foot exam Skin: warm and dry, no rashes noted Neuro: alert and oriented, no focal deficits   Assessment & Plan:   Requested medications refilled  Diabetes mellitus without complication I4P 7.2. Will continue current med regimen. There is some concern that Medicare will not cover her Januvia. She will follow with her provider for more information  Encounter for chronic pain management Percocet and clonipin refilled for three months worse. She has ben on this regimen since being seen by her previous provider. Will need to begin tapering. Will start with tapering clonipin at next visit  Andi Layfield A. Lincoln Brigham MD, [facility] Resident PGY-1 Pager [phone]

## 2015-04-21 ENCOUNTER — Telehealth: Payer: Self-pay | Admitting: Student

## 2015-04-21 ENCOUNTER — Other Ambulatory Visit: Payer: Self-pay | Admitting: Family Medicine

## 2015-04-21 MED ORDER — OXYCODONE-ACETAMINOPHEN 10-325 MG PO TABS: 1.0000 | ORAL_TABLET | Freq: Four times a day (QID) | ORAL | Status: DC | PRN

## 2015-04-21 MED ORDER — CLONAZEPAM 1 MG PO TABS
1.0000 mg | ORAL_TABLET | Freq: Two times a day (BID) | ORAL | Status: DC | PRN
Start: 2015-04-21 — End: 2015-05-04

## 2015-04-21 MED ORDER — CLONAZEPAM 1 MG PO TABS: 1.0000 mg | ORAL_TABLET | Freq: Two times a day (BID) | ORAL | Status: DC | PRN

## 2015-04-21 NOTE — Telephone Encounter (Signed)
I reviewed the patients chart - spoke with the phrmacy and reviewed the [place] narcotics database  Rxs for oxycodone and Klonopin were recorded as written by Dr Lincoln Brigham in July and October for 3 months each time.  None had been filled in the [place] database.  Rxs from Dr Caryl Bis her pior PCP had been filled and no other prescribers were listed  Apparently at [facility] when they put in Dr Etta Grandchild information the Rx were denied.   Patient recently received medicare.  I took and shredded the Rxs written by Dr Lincoln Brigham and rewrote and printed new ones for 3 months of oxycodone and klonopin.  Dr Lincoln Brigham should determine if any other patients have had problems filling narcotics or perhap this is a pharmacy or a Medicare issue

## 2015-04-21 NOTE — Telephone Encounter (Signed)
Error. addended previous message

## 2015-04-21 NOTE — Telephone Encounter (Signed)
Pt came in to have new prescriptions written by a faculty provider that is [employer] enrolled. She returned the previous six written prescriptions to Dr. Erin Hearing and he provided the pt with the correct prescriptions needed. Sadie Reynolds, ASA

## 2015-04-21 NOTE — Telephone Encounter (Signed)
Pt called because [facility] will not fill her Klonopin or her Oxycodone. They need new prescriptions for both with a faculty doctor. We can fax the prescription for the Klonopin but not the Oxycodone. jw

## 2015-04-21 NOTE — Telephone Encounter (Signed)
Will forward to MD.  She will need to check with a preceptor or [employer] certified provider to sign. Greycen Felter,CMA

## 2015-04-24 NOTE — Assessment & Plan Note (Signed)
A1c 7.2. Will continue current med regimen. There is some concern that Medicare will not cover her Januvia. She will follow with her provider for more information

## 2015-04-24 NOTE — Assessment & Plan Note (Addendum)
Percocet and clonipin refilled for three months worse. She has ben on this regimen since being seen by her previous provider. Will need to begin tapering. Will start with tapering clonipin at next visit

## 2015-04-28 ENCOUNTER — Ambulatory Visit: Payer: Medicare Other | Admitting: Pharmacist

## 2015-05-01 ENCOUNTER — Other Ambulatory Visit: Payer: Self-pay | Admitting: Student

## 2015-05-02 NOTE — Telephone Encounter (Signed)
Rx filled.  Algis Greenhouse. Jerline Pain, [facility] Resident PGY-2 05/02/2015 9:28 AM

## 2015-05-04 ENCOUNTER — Ambulatory Visit (INDEPENDENT_AMBULATORY_CARE_PROVIDER_SITE_OTHER): Payer: Medicare Other | Admitting: Pharmacist

## 2015-05-04 ENCOUNTER — Encounter: Payer: Self-pay | Admitting: Pharmacist

## 2015-05-04 VITALS — BP 117/76 | HR 80 | Ht 64.0 in | Wt 161.8 lb

## 2015-05-04 DIAGNOSIS — E119 Type 2 diabetes mellitus without complications: Secondary | ICD-10-CM | POA: Diagnosis not present

## 2015-05-04 NOTE — Progress Notes (Signed)
S:    Patient arrives in good spirits, ambulating without assistance.  Presents for medication review and assistance with prescription coverage under new Medicare insurance.   Patient reports adherence with medications.   Patient is concerned that the following medications will not be covered by her new insurance: Januvia, Ventolin, Accuchek test strips, and Humira.    I called patient's pharmacy and confirmed that both Januvia and Ventolin are covered by [employer] with a copay of $3.60.  Accuchek test strips are approximately $17 through medicare.  Unable to confirm price of Humira at [place] because patient uses specialty mail order pharmacy.  Patient report she has already filled out Humira patient assistance application to get Humira through the drug company.    O:  Lab Results  Component Value Date   HGBA1C 7.0 04/20/2015    A/P: Diabetes currently well controlled.   Patient denies hypoglycemic events and is able to verbalize appropriate hypoglycemia management plan.  Patient reports adherence with medication.  Continue metformin 1000mg  BID and Januvia 100mg  daily.  Patient is currently checking blood sugar three times per day.  Counseled patient to check blood sugar several times per week.    Clarified patient drug coverage with pharmacy.   Patient educated on pricing and availability.  No additional changes today.    Written patient instructions provided.  Total time in face to face counseling 30 minutes.  Follow up in clinic with Dr. Jerline Pain in 2 months.   Patient seen with Liliane Shi, PharmD Candidate, Angela Burke, PharmD Resident.and Elisabeth Most, PharmD, Resident.  Marland Kitchen

## 2015-05-04 NOTE — Assessment & Plan Note (Addendum)
Diabetes currently well controlled.   Patient denies hypoglycemic events and is able to verbalize appropriate hypoglycemia management plan.  Patient reports adherence with medication. Continue metformin 1000mg  BID and Januvia 100mg  daily.  Patient is currently checking blood sugar three times per day.  Counseled patient to check blood sugar several times per week.

## 2015-05-04 NOTE — Progress Notes (Signed)
   Subjective:    Patient ID: Michelle [name], female    DOB: 1950/12/08, 64 y.o.   MRN: [id number]  HPI Reviewed: Agree with Dr. Graylin Shiver documentation and management.      Review of Systems     Objective:   Physical Exam        Assessment & Plan:

## 2015-05-04 NOTE — Patient Instructions (Signed)
Thank you for visiting [place] today! We are not recommending any changes to your medications today.   We checked with your insurance company and your Januvia and Ventolin should be covered by your insurance company and should cost $3.60 each. Your blood glucose test strips will also be covered but the copay will be $17 for these. Your A1c and blood sugars have been well controlled.  Check your blood sugar a few times a week. There is a script ready at your [place] for your test strips.

## 2015-06-05 ENCOUNTER — Other Ambulatory Visit: Payer: Self-pay | Admitting: Student

## 2015-06-05 NOTE — Telephone Encounter (Signed)
Refill request from pharmacy. Will forward to PCP for review. Deanndra Kirley, CMA. 

## 2015-06-06 NOTE — Telephone Encounter (Signed)
Rx filled.  Algis Greenhouse. Jerline Pain, [place] Resident PGY-2 06/06/2015 1:57 PM

## 2015-06-14 ENCOUNTER — Ambulatory Visit (INDEPENDENT_AMBULATORY_CARE_PROVIDER_SITE_OTHER): Payer: Medicare Other | Admitting: Family Medicine

## 2015-06-14 ENCOUNTER — Encounter: Payer: Self-pay | Admitting: Family Medicine

## 2015-06-14 VITALS — BP 143/80 | HR 110 | Temp 98.4°F | Wt 157.0 lb

## 2015-06-14 DIAGNOSIS — T3695XA Adverse effect of unspecified systemic antibiotic, initial encounter: Secondary | ICD-10-CM

## 2015-06-14 DIAGNOSIS — J45909 Unspecified asthma, uncomplicated: Secondary | ICD-10-CM | POA: Diagnosis not present

## 2015-06-14 DIAGNOSIS — J189 Pneumonia, unspecified organism: Secondary | ICD-10-CM | POA: Diagnosis present

## 2015-06-14 DIAGNOSIS — B379 Candidiasis, unspecified: Secondary | ICD-10-CM

## 2015-06-14 MED ORDER — FLUCONAZOLE 150 MG PO TABS: 150.0000 mg | ORAL_TABLET | Freq: Once | ORAL | Status: DC

## 2015-06-14 MED ORDER — BENZONATATE 100 MG PO CAPS: 100.0000 mg | ORAL_CAPSULE | Freq: Three times a day (TID) | ORAL | Status: DC | PRN

## 2015-06-14 MED ORDER — LEVOFLOXACIN 750 MG PO TABS: 750.0000 mg | ORAL_TABLET | Freq: Every day | ORAL | Status: DC

## 2015-06-14 NOTE — Patient Instructions (Signed)
Thank you for coming in to clinic today.  1. It sounds like you have a developing Lung Infection - could be a Bronchitis originally from a Virus, however we will cover you with antibiotics to make sure this doesn't develop into Pneumonia. 2. Start Levaquin 750mg  daily for total 5 days 3. Start cough medicine Tessalon Perls as prescribed up to 3 times daily 4. May try over the counter Mucinex-DM twice daily for up to 5 to 7 days 5. May use heating pad on Left Back  If develop worsening cough, fevers, vomiting, chest pain, not tolerating medicines, please call and follow-up, otherwise may need to go to the ED if difficulty breathing or chest pains.  Please schedule a follow-up appointment with Dr Jerline Pain within 7 to 10 days if not improved  If you have any other questions or concerns, please feel free to call the clinic to contact me. You may also schedule an earlier appointment if necessary.  However, if your symptoms get significantly worse, please go to the Emergency Department to seek immediate medical attention.  Nobie Putnam, [place]

## 2015-06-14 NOTE — Progress Notes (Signed)
Subjective:    Patient ID: Michelle [name], female    DOB: 07/27/1950, 64 y.o.   MRN: [id number]  Michelle [name] is a 65 y.o. female presenting on 06/14/2015 for Cough; Shortness of Breath; and Back Pain  Patient presents for a same day appointment.  HPI  URI SYMPTOMS / DYSPNEA / CHRONIC ASTHMA: - History of Asthma, followed by Pulmonology in [facility]. Currently using Qvar twice daily and Albuterol (ProAir) about out next refill is Monday due to filling too early, but states this has not necessarily been helping. - Symptoms started Sunday night 3 days ago with coughing and chest congestion, initially cough non-productive with some shortness of breath, cough is more productive now but difficult to get sputum up to thick chest congestion. Now cough associated with sharp left back pain with deep cough. - Tried Delsym for cough without relief - Tolerating PO well, trying to improve hydration, taste is off but still eating - No sick contacts at home but recently out around some people with similar cough - On Humira injection for RA, immunosuppressed  - Admits low-grade fever (101F) - Denies any sweating, chills, nausea, vomiting, abdominal pain, diarrhea  Past Medical History  Diagnosis Date  . RA (rheumatoid arthritis) (HCC)     CCP positive followed by [facility]  . HLD (hyperlipidemia)   . Diabetes mellitus   . Asthma   . Allergy   . Anxiety   . Cataract     beginning on left eye  . GERD (gastroesophageal reflux disease)   . Osteoporosis   . Diverticulosis   . Lung nodules     Social History   Social History  . Marital Status: Married    Spouse Name: N/A  . Number of Children: 3  . Years of Education: N/A   Occupational History  . disabled   .     Social History Main Topics  . Smoking status: Former Smoker -- 1.50 packs/day for 5 years    Types: Cigarettes    Quit date: 05/31/1994  . Smokeless tobacco: Never Used  . Alcohol Use: No  . Drug Use: No  . Sexual  Activity: Not on file   Other Topics Concern  . Not on file   Social History Narrative   Pt is on [employer] program [place]   Lots of family stressors including grandaughter who lives with her          Current Outpatient Prescriptions on File Prior to Visit  Medication Sig  . adalimumab (HUMIRA) 40 MG/0.8ML injection Inject 40 mg into the skin every 14 (fourteen) days.  Marland Kitchen albuterol (PROAIR HFA) 108 (90 BASE) MCG/ACT inhaler INHALE TWO PUFFS INTO LUNGS AS NEEDED FOR WHEEZING OR SHORTNESS OF BREATH  . aspirin EC 81 MG tablet Take 81 mg by mouth daily.  . beclomethasone (QVAR) 40 MCG/ACT inhaler Inhale 2 puffs into the lungs 2 (two) times daily.  . Blood Glucose Monitoring Suppl (ONE TOUCH ULTRA 2) W/DEVICE KIT 1 Device by Does not apply route once.  . clonazePAM (KLONOPIN) 1 MG tablet Take 1 tablet (1 mg total) by mouth 2 (two) times daily as needed for anxiety. Do not fill until 30 days after this prescription date.  . cyclobenzaprine (FLEXERIL) 10 MG tablet Take 1 tablet (10 mg total) by mouth 3 (three) times daily as needed for muscle spasms. (Patient not taking: Reported on 05/04/2015)  . diclofenac sodium (VOLTAREN) 1 % GEL Apply 2 g topically 4 (four) times daily  as needed (for pain).  Marland Kitchen gabapentin (NEURONTIN) 300 MG capsule Take 1 capsule (300 mg total) by mouth at bedtime.  Marland Kitchen glucose blood (ACCU-CHEK AVIVA PLUS) test strip Check glucose three times daily  . glucose blood (ONE TOUCH ULTRA TEST) test strip Use as instructed  . Lancets (ONETOUCH ULTRASOFT) lancets Use as instructed  . leflunomide (ARAVA) 10 MG tablet Take 10 mg by mouth daily.  Marland Kitchen levocetirizine (XYZAL) 5 MG tablet TAKE ONE TABLET BY MOUTH ONCE DAILY IN THE EVENING  . losartan (COZAAR) 50 MG tablet TAKE 1 TABLET BY MOUTH EVERY DAY  . metFORMIN (GLUCOPHAGE) 1000 MG tablet Take 1 tablet (1,000 mg total) by mouth 2 (two) times daily with a meal.  . mometasone (NASONEX) 50 MCG/ACT nasal spray USE TWO SPRAY(S) IN EACH NOSTRIL  ONCE DAILY  . NEXIUM 40 MG capsule Take 1 capsule (40 mg total) by mouth 2 (two) times daily before a meal. Brand name medically necessary  . nitroGLYCERIN (NITROSTAT) 0.4 MG SL tablet Place 0.4 mg under the tongue every 5 (five) minutes as needed.    Marland Kitchen oxyCODONE-acetaminophen (PERCOCET) 10-325 MG tablet Take 1 tablet by mouth every 6 (six) hours as needed for pain. Do not fill until 60 days after this prescription date.  . propranolol (INDERAL) 40 MG tablet TAKE 1 TABLET (40 MG TOTAL) BY MOUTH 2 (TWO) TIMES DAILY. (Patient not taking: Reported on 05/04/2015)  . rosuvastatin (CRESTOR) 10 MG tablet Take 1 tablet (10 mg total) by mouth daily.  . sertraline (ZOLOFT) 50 MG tablet Take 1 tablet (50 mg total) by mouth daily.  . sitaGLIPtin (JANUVIA) 100 MG tablet Take 1 tablet (100 mg total) by mouth daily.  Marland Kitchen triamcinolone (KENALOG) 0.1 % paste Use as directed 1 application in the mouth or throat 2 (two) times daily. Press a small amount (about 1/4 inch) to the lesion twice a day. (Patient not taking: Reported on 05/04/2015)  . triamcinolone cream (KENALOG) 0.1 % APPLY  CREAM EXTERNALLY TO AFFECTED AREA AS NEEDED   No current facility-administered medications on file prior to visit.    Review of Systems Per HPI unless specifically indicated above     Objective:    BP 143/80 mmHg  Pulse 110  Temp(Src) 98.4 F (36.9 C) (Oral)  Wt 157 lb (71.215 kg)  SpO2 94%  Wt Readings from Last 3 Encounters:  06/14/15 157 lb (71.215 kg)  05/04/15 161 lb 12.8 oz (73.392 kg)  04/20/15 158 lb (71.668 kg)    Physical Exam  Constitutional: She is oriented to person, place, and time. She appears well-developed and well-nourished. No distress.  Ill appearing but non-toxic, cooperative  HENT:  Head: Normocephalic and atraumatic.  Mouth/Throat: Oropharynx is clear and moist. No oropharyngeal exudate.  Sinuses non-tender. TMs clear.  Eyes: Conjunctivae are normal. Right eye exhibits no discharge. Left eye  exhibits no discharge.  Neck: Normal range of motion. Neck supple. No thyromegaly present.  Cardiovascular: Normal rate, regular rhythm, normal heart sounds and intact distal pulses.   No murmur heard. Pulmonary/Chest: Effort normal. No respiratory distress. She has no wheezes. She has rales (bibasilar scattered rales with some improve on cough). She exhibits no tenderness.  Good air movement. Speaks full sentences. No resp distress.  Musculoskeletal: Normal range of motion.  Left low back mildly tender to palpation with mild paraspinal lumbar muscle spasm. No deformity.  Lymphadenopathy:    She has no cervical adenopathy.  Neurological: She is alert and oriented to person, place, and time.  Skin: Skin is warm and dry. She is not diaphoretic.  Psychiatric: Her behavior is normal.  Nursing note and vitals reviewed.  Results for orders placed or performed in visit on 04/20/15  POCT A1C  Result Value Ref Range   Hemoglobin A1C 7.0       Assessment & Plan:   Problem List Items Addressed This Visit    Asthma, chronic (Chronic)   CAP (community acquired pneumonia) - Primary    Clinically consistent with presumed CAP in high risk immunosuppressed patient (humira) also with DM, likely evolved from possible viral URI / bronchitis. No recent hospitalization or IV antibiotics within past 90 days. History of asthma but no acute wheezing and minimal response to albuterol. Considered unlikely PE (Well's 1.5 for tachy). - Afebrile, tachycardic, no hypoxia (94% on RA)  Plan: 1. Start empiric antibiotics with Levaquin 733m daily x 5 days for high risk patient 2. Tessalon Perls for cough TID PRN 3. Recommend improve hydration and add Mucinex-DM twice daily for up to 5-7 days if sputum still thick 4. Heating pad on left back, likely muscle spasm from coughing 5. RTC about 1 week if not improving, otherwise strict return criteria to go to ED       Relevant Medications   levofloxacin (LEVAQUIN)  750 MG tablet   benzonatate (TESSALON) 100 MG capsule   fluconazole (DIFLUCAN) 150 MG tablet    Other Visit Diagnoses    Antibiotic-induced yeast infection        Empiric diflucan rx given with history of yeast vaginitis on antibiotic courses. Only fill if develop infxn.    Relevant Medications    fluconazole (DIFLUCAN) 150 MG tablet       Meds ordered this encounter  Medications  . levofloxacin (LEVAQUIN) 750 MG tablet    Sig: Take 1 tablet (750 mg total) by mouth daily.    Dispense:  5 tablet    Refill:  0  . benzonatate (TESSALON) 100 MG capsule    Sig: Take 1 capsule (100 mg total) by mouth 3 (three) times daily as needed for cough.    Dispense:  30 capsule    Refill:  0  . fluconazole (DIFLUCAN) 150 MG tablet    Sig: Take 1 tablet (150 mg total) by mouth once. Take 2nd pill on Day 3 if still present.    Dispense:  1 tablet    Refill:  0      Follow up plan: Return in about 1 week (around 06/21/2015), or if symptoms worsen or fail to improve, for CAP.  ANobie Putnam [place]oun [place] PGY-3

## 2015-06-15 NOTE — Assessment & Plan Note (Signed)
Clinically consistent with presumed CAP in high risk immunosuppressed patient (humira) also with DM, likely evolved from possible viral URI / bronchitis. No recent hospitalization or IV antibiotics within past 90 days. History of asthma but no acute wheezing and minimal response to albuterol. Considered unlikely PE (Well's 1.5 for tachy). - Afebrile, tachycardic, no hypoxia (94% on RA)  Plan: 1. Start empiric antibiotics with Levaquin 750mg  daily x 5 days for high risk patient 2. Tessalon Perls for cough TID PRN 3. Recommend improve hydration and add Mucinex-DM twice daily for up to 5-7 days if sputum still thick 4. Heating pad on left back, likely muscle spasm from coughing 5. RTC about 1 week if not improving, otherwise strict return criteria to go to ED

## 2015-06-20 ENCOUNTER — Other Ambulatory Visit: Payer: Self-pay | Admitting: Family Medicine

## 2015-06-20 ENCOUNTER — Telehealth: Payer: Self-pay | Admitting: *Deleted

## 2015-06-20 NOTE — Telephone Encounter (Signed)
Rx filled.  Algis Greenhouse. Jerline Pain, [place] [facility] Resident PGY-2 06/20/2015 2:16 PM

## 2015-06-20 NOTE — Telephone Encounter (Signed)
Received faxed request for clarification on fluconazole 150 mg. Rx written to take 1 tablet by mouth once. Take second tablet on Day 3 if still present, however, quantity was listed as 1. Received verbal order for second tablet from Dr. Parks Ranger. Spoke with Lajean Saver at [facility] who stated 2 tablets were distributed to patient today. Velora Heckler, RN

## 2015-07-04 ENCOUNTER — Encounter: Payer: Self-pay | Admitting: Family Medicine

## 2015-07-04 ENCOUNTER — Ambulatory Visit (INDEPENDENT_AMBULATORY_CARE_PROVIDER_SITE_OTHER): Payer: Medicare Other | Admitting: Family Medicine

## 2015-07-04 VITALS — BP 140/81 | HR 91 | Temp 98.1°F | Wt 158.0 lb

## 2015-07-04 DIAGNOSIS — J0110 Acute frontal sinusitis, unspecified: Secondary | ICD-10-CM

## 2015-07-04 DIAGNOSIS — H109 Unspecified conjunctivitis: Secondary | ICD-10-CM | POA: Diagnosis not present

## 2015-07-04 MED ORDER — OLOPATADINE HCL 0.2 % OP SOLN: 1.0000 [drp] | Freq: Every day | OPHTHALMIC | Status: DC

## 2015-07-04 MED ORDER — AMOXICILLIN-POT CLAVULANATE ER 1000-62.5 MG PO TB12: 2.0000 | ORAL_TABLET | Freq: Two times a day (BID) | ORAL | Status: DC

## 2015-07-04 MED ORDER — FLUCONAZOLE 150 MG PO TABS: 150.0000 mg | ORAL_TABLET | Freq: Every day | ORAL | Status: DC

## 2015-07-04 NOTE — Patient Instructions (Signed)
Sinus infection: Augmentin 2 tablets twice a day for 7 days  Pink eyes: Most likely allergy related or viral. The pataday drops should help with the symptoms. Don't take this long term as once you stop the medicine it will make your eyes worse. Take for up to 10 days at a time.

## 2015-07-04 NOTE — Progress Notes (Signed)
   Subjective:    Patient ID: Michelle [name], female    DOB: 06-07-1951, 65 y.o.   MRN: [id number]  HPI  Patient presents for Same Day Appointment  CC: pink eye  # Red eyes:  Started 2 days ago  Both eyes  In the morning wakes up with some crusting but only "mild". No other discharge.  No changes in vision, but does have blurry vision at baseline  Not really painful  # Sinus infection  Severe pain in front between eyes  Has been sick for several weeks , pain started more recently  Nasal discharge switches from runny to thick  Does not typically get major sinus issues (but does take nasonex and xyzal) ROS: no fevers, no chills, +cough   Social Hx: former smoker, quit 1995  Review of Systems   See HPI for ROS.   Past medical history, surgical, family, and social history reviewed and updated in the EMR as appropriate.  Objective:  BP 140/81 mmHg  Pulse 91  Temp(Src) 98.1 F (36.7 C) (Oral)  Wt 158 lb (71.668 kg) Vitals and nursing note reviewed  General: NAD Eyes: PERRL, EOMI. normal conjunctiva and sclera, no erythema. There is no discharge. ENTM: tenderness to percussion over frontal sinus. Dry mucous membranes, no posterior pharyngeal erythema CV: RRR, normal heart sounds, no murmur appreciated Resp: left sided wheezes and rhonchi, right sided rhonchi. Normal effort.  Assessment & Plan:   1. Acute frontal sinusitis, recurrence not specified With recent antibiotic use, will treat with augmentin 1000-125 BID x 7 days. Continue xyzal and nasocort spray. Follow up as needed  2. Bilateral conjunctivitis Suspect allergy related, also possibly viral; no evidence of bacterial etiology on exam today (which was overall normal). Will treat with pataday drops for symptoms. Follow up as needed.

## 2015-07-06 ENCOUNTER — Telehealth: Payer: Self-pay | Admitting: *Deleted

## 2015-07-06 NOTE — Telephone Encounter (Signed)
Pharmacist from [facility] called stating they do not have amoxicillin-clavulanate (AUGMENTIN XR) 1000-62.5 MG in stock and they can not order the medication.  The medication was prescribed by Dr. Lamar Benes who is post-call today.  Pharmacist stated they can switch it to the Augmentin 500.  Please give them a call at [phone].  Michelle Barrow, RN

## 2015-07-07 MED ORDER — DOXYCYCLINE HYCLATE 100 MG PO TABS: 100.0000 mg | ORAL_TABLET | Freq: Two times a day (BID) | ORAL | Status: AC

## 2015-07-07 NOTE — Telephone Encounter (Signed)
I was post call yesterday on OB as well. I will forward the message to Dr Lamar Benes.  Algis Greenhouse. Jerline Pain, [facility] [facility] Resident PGY-2 07/07/2015 9:03 AM

## 2015-07-07 NOTE — Telephone Encounter (Signed)
I sent in a prescription for doxycycline 100 twice daily for 7 days. (she had recently been on levaquin x 5 days in December). Can you call the patient and let her know she can pick this up now? Thanks.

## 2015-07-07 NOTE — Telephone Encounter (Signed)
Spoke with patient and she is aware that a new script has been sent into pharmacy. Michelle [name],CMA

## 2015-07-07 NOTE — Telephone Encounter (Signed)
Pt calling to check on status of this request. She states she needs this before the inclement weather. Sadie Reynolds, ASA

## 2015-07-17 ENCOUNTER — Other Ambulatory Visit: Payer: Self-pay

## 2015-07-17 DIAGNOSIS — Z1231 Encounter for screening mammogram for malignant neoplasm of breast: Secondary | ICD-10-CM

## 2015-07-18 ENCOUNTER — Other Ambulatory Visit: Payer: Self-pay | Admitting: *Deleted

## 2015-07-18 MED ORDER — LOSARTAN POTASSIUM 50 MG PO TABS: 50.0000 mg | ORAL_TABLET | Freq: Every day | ORAL | Status: DC

## 2015-07-18 NOTE — Telephone Encounter (Signed)
Rx filled.  Algis Greenhouse. Jerline Pain, [place] Resident PGY-2 07/18/2015 2:19 PM

## 2015-07-19 ENCOUNTER — Ambulatory Visit (INDEPENDENT_AMBULATORY_CARE_PROVIDER_SITE_OTHER): Payer: Medicare Other | Admitting: Family Medicine

## 2015-07-19 ENCOUNTER — Encounter: Payer: Self-pay | Admitting: Family Medicine

## 2015-07-19 VITALS — BP 127/70 | HR 81 | Temp 98.7°F | Resp 16 | Ht 64.0 in | Wt 161.0 lb

## 2015-07-19 DIAGNOSIS — E119 Type 2 diabetes mellitus without complications: Secondary | ICD-10-CM | POA: Diagnosis not present

## 2015-07-19 DIAGNOSIS — F329 Major depressive disorder, single episode, unspecified: Secondary | ICD-10-CM

## 2015-07-19 DIAGNOSIS — J45909 Unspecified asthma, uncomplicated: Secondary | ICD-10-CM | POA: Diagnosis not present

## 2015-07-19 DIAGNOSIS — E118 Type 2 diabetes mellitus with unspecified complications: Secondary | ICD-10-CM

## 2015-07-19 DIAGNOSIS — F418 Other specified anxiety disorders: Secondary | ICD-10-CM | POA: Diagnosis not present

## 2015-07-19 DIAGNOSIS — H9190 Unspecified hearing loss, unspecified ear: Secondary | ICD-10-CM

## 2015-07-19 DIAGNOSIS — Z7189 Other specified counseling: Secondary | ICD-10-CM

## 2015-07-19 DIAGNOSIS — Z79899 Other long term (current) drug therapy: Secondary | ICD-10-CM | POA: Diagnosis not present

## 2015-07-19 DIAGNOSIS — G8929 Other chronic pain: Secondary | ICD-10-CM

## 2015-07-19 DIAGNOSIS — F419 Anxiety disorder, unspecified: Secondary | ICD-10-CM

## 2015-07-19 DIAGNOSIS — F32A Depression, unspecified: Secondary | ICD-10-CM

## 2015-07-19 HISTORY — DX: Unspecified hearing loss, unspecified ear: H91.90

## 2015-07-19 LAB — BASIC METABOLIC PANEL WITH GFR
BUN: 12 mg/dL (ref 7–25)
CHLORIDE: 103 mmol/L (ref 98–110)
CO2: 26 mmol/L (ref 20–31)
Calcium: 9.1 mg/dL (ref 8.6–10.4)
Creat: 0.49 mg/dL — ABNORMAL LOW (ref 0.50–0.99)
GFR, Est African American: >89 mL/min (ref >=60)
GFR, Est Non African American: >89 mL/min (ref >=60)
GLUCOSE: 119 mg/dL — AB (ref 65–99)
POTASSIUM: 4.3 mmol/L (ref 3.5–5.3)
Sodium: 137 mmol/L (ref 135–146)

## 2015-07-19 LAB — LIPID PANEL
CHOLESTEROL: 112 mg/dL — AB (ref 125–200)
HDL: 46 mg/dL (ref >=46)
LDL Cholesterol: 51 mg/dL (ref <130)
Total CHOL/HDL Ratio: 2.4 Ratio (ref <=5.0)
Triglycerides: 74 mg/dL (ref <150)
VLDL: 15 mg/dL (ref <30)

## 2015-07-19 LAB — CBC
HEMATOCRIT: 37.2 % (ref 36.0–46.0)
Hemoglobin: 12 g/dL (ref 12.0–15.0)
MCH: 29.7 pg (ref 26.0–34.0)
MCHC: 32.3 g/dL (ref 30.0–36.0)
MCV: 92.1 fL (ref 78.0–100.0)
MPV: 10 fL (ref 8.6–12.4)
PLATELETS: 255 10*3/uL (ref 150–400)
RBC: 4.04 MIL/uL (ref 3.87–5.11)
RDW: 14.3 % (ref 11.5–15.5)
WBC: 6.4 10*3/uL (ref 4.0–10.5)

## 2015-07-19 LAB — POCT GLYCOSYLATED HEMOGLOBIN (HGB A1C): HEMOGLOBIN A1C: 6.6

## 2015-07-19 MED ORDER — OXYCODONE-ACETAMINOPHEN 10-325 MG PO TABS: 1.0000 | ORAL_TABLET | Freq: Four times a day (QID) | ORAL | Status: DC | PRN

## 2015-07-19 MED ORDER — ALBUTEROL SULFATE HFA 108 (90 BASE) MCG/ACT IN AERS: 2.0000 | INHALATION_SPRAY | Freq: Four times a day (QID) | RESPIRATORY_TRACT | Status: DC | PRN

## 2015-07-19 MED ORDER — CETIRIZINE HCL 10 MG PO TABS: 10.0000 mg | ORAL_TABLET | Freq: Every day | ORAL | Status: DC

## 2015-07-19 MED ORDER — CLONAZEPAM 1 MG PO TABS: 1.0000 mg | ORAL_TABLET | Freq: Two times a day (BID) | ORAL | Status: DC | PRN

## 2015-07-19 MED ORDER — SITAGLIPTIN PHOSPHATE 100 MG PO TABS: 100.0000 mg | ORAL_TABLET | Freq: Every day | ORAL | Status: DC

## 2015-07-19 NOTE — Patient Instructions (Signed)
We will refill your percocet and klonopin today.  We will check your blood sugar and cholesterol levels today. We will refill your [place].  We will switch your proair to ventolin.  We will switch your xyzal to zyrtec to see if that helps with the itching.   I sent in a referral for your hearing aid.  Please come back in 3 months.   Take care,  Dr Jerline Pain

## 2015-07-19 NOTE — Progress Notes (Signed)
Subjective:  Michelle [name] is a 65 y.o. female who presents to the [facility] today with a chief complaint of diabetes follow up.   HPI:  Diabetes Stable. Tolerating metformin and januvia without side effects. No hypoglycemia (palpitations, sweating, shakiness). No polyuria or polydipsia.  Chronic Pain / RA Stable. Located primarily in patient's feet, knees, and hands. Due to her rheumatoid arthritis. Pain well controlled with percocet and gabapentin. Tolerating medications without side effects. Will be going to see rheumatologist tomorrow.   Anxiety Stable. Currently managed with zoloft and klonpin. Tolerating these medications without side effects.   Restrictive Lung Disease / Asthma Stable. Needs to switch from proair to ventolin due to her insurance. Just getting over a pneumonia. Still has some cough. No increased shortness of breath.No fevers or chills.  Hearing Loss Patient currently has a hearing aid and needs a referral back to audiology to have her hearing aid rechecked.   ROS: Per HPI  PMH:  The following were reviewed and entered/updated in epic: Past Medical History  Diagnosis Date  . RA (rheumatoid arthritis) (HCC)     CCP positive followed by [facility]  . HLD (hyperlipidemia)   . Diabetes mellitus   . Asthma   . Allergy   . Anxiety   . Cataract     beginning on left eye  . GERD (gastroesophageal reflux disease)   . Osteoporosis   . Diverticulosis   . Lung nodules    Patient Active Problem List   Diagnosis Date Noted  . Hearing loss 07/19/2015  . Low back pain 12/23/2014  . Squamous cell carcinoma in situ of skin 11/25/2014  . Neck pain 08/05/2014  . Chronic cough 08/05/2014  . Encounter for chronic pain management 08/05/2014  . Aphthous stomatitis 08/03/2014  . Pulsatile tinnitus of right ear 07/05/2014  . Prolonged Q-T interval on ECG 07/05/2014  . Memory difficulties 07/05/2014  . Pulmonary nodules 03/02/2014  . Weight loss, unintentional 03/02/2014    . Trouble swallowing 01/26/2014  . Dry mouth 01/26/2014  . Fine tremor 05/12/2013  . Headache(784.0) 02/04/2013  . Chest pain 02/04/2013  . Unspecified essential hypertension 10/30/2012  . Irritable bowel 07/13/2012  . Restless legs 05/14/2012  . Gastroparesis 11/19/2011  . Osteoporosis, unspecified 08/08/2011  . Restrictive lung disease 08/09/2010  . Asthma, chronic 03/14/2010  . Rheumatoid arthritis ([place]) 01/08/2010  . ROTATOR CUFF TEAR 08/30/2009  . CARPAL TUNNEL SYNDROME, LEFT 05/29/2009  . MEDIAL MENISCUS TEAR, LEFT 04/27/2009  . GERD 08/22/2008  . Diabetes mellitus without complication ('[place]) [id number]  . HYPERLIPIDEMIA 08/28/2006  . OBESITY, NOS 08/28/2006  . Anxiety and depression 08/28/2006  . Allergic rhinitis 08/28/2006   Past Surgical History  Procedure Laterality Date  . Appendectomy    . Cholecystectomy    . Abdominal hysterectomy    . Carpal tunnel release    . Knee surgery      x2  . Hernia repair    . Shoulder surgery      x2  . Right foot bone spurs removed    . Left and right heart catheterization with coronary angiogram N/A 08/17/2013    Procedure: LEFT AND RIGHT HEART CATHETERIZATION WITH CORONARY ANGIOGRAM;  Surgeon: Laverda Page, MD;  Location: [facility] CATH LAB;  Service: Cardiovascular;  Laterality: N/A;     Objective:  Physical Exam: BP 127/70 mmHg  Pulse 81  Temp(Src) 98.7 F (37.1 C) (Oral)  Resp 16  Ht 5\' 4"  (1.626 m)  Wt 161 lb (73.029  kg)  BMI 27.62 kg/m2  SpO2 98%  Gen: NAD, resting comfortably CV: RRR with no murmurs appreciated Pulm: NWOB, CTAB with no crackles, wheezes, or rhonchi GI: Normal bowel sounds present. Soft, Nontender, Nondistended. MSK: no edema, cyanosis, or clubbing noted Skin: warm, dry Neuro: grossly normal, moves all extremities Psych: Normal affect and thought content  Results for orders placed or performed in visit on 07/19/15 (from the past 72 hour(s))  POCT glycosylated hemoglobin (Hb A1C)     Status:  None   Collection Time: 07/19/15  9:15 AM  Result Value Ref Range   Hemoglobin A1C 6.6      Assessment/Plan:  Diabetes mellitus without complication 123456 6.6 today. Continue metformin and Januvia.   Encounter for chronic pain management Stable. Gave 3 month supply of percocet today. Will be seeing rheumatologist tomorrow.   Anxiety and depression Anxiety stable. Will refill klonopin and Zoloft today. Would likely benefit from titration off benzos. Will obtain GAD and PHQ at next office visit.   Asthma, chronic Stable on albuterol and qvar. Will send in refill for ventolin inhaler. Will need repeat PFTs in the near future to better characterize her underlying lung disease - has a diagnosis of asthma and restrictive lung disease, though possibly also has COPD component.   Hearing loss Referral to audiology placed.    Algis Greenhouse. Jerline Pain, [place] Resident PGY-2 07/19/2015 9:35 AM

## 2015-07-19 NOTE — Assessment & Plan Note (Addendum)
A1c 6.6 today. Continue metformin and Januvia.

## 2015-07-19 NOTE — Assessment & Plan Note (Signed)
Stable on albuterol and qvar. Will send in refill for ventolin inhaler. Will need repeat PFTs in the near future to better characterize her underlying lung disease - has a diagnosis of asthma and restrictive lung disease, though possibly also has COPD component.

## 2015-07-19 NOTE — Assessment & Plan Note (Signed)
-  Referral to audiology placed °

## 2015-07-19 NOTE — Assessment & Plan Note (Signed)
Stable. Gave 3 month supply of percocet today. Will be seeing rheumatologist tomorrow.

## 2015-07-19 NOTE — Progress Notes (Signed)
Patient's here for f/up meds.   She also c/o of neck and back pain that's being going on x6-8 months.Rate neck and back pain 8/10, described as painful and hard to bend over or pick things up.  Patient states she has medicare and medicare will not take rx from resident [place].  Patient needs medication refills.

## 2015-07-19 NOTE — Assessment & Plan Note (Signed)
Anxiety stable. Will refill klonopin and Zoloft today. Would likely benefit from titration off benzos. Will obtain GAD and PHQ at next office visit.

## 2015-07-20 ENCOUNTER — Telehealth: Payer: Self-pay | Admitting: *Deleted

## 2015-07-20 ENCOUNTER — Encounter: Payer: Self-pay | Admitting: Family Medicine

## 2015-07-20 DIAGNOSIS — Z23 Encounter for immunization: Secondary | ICD-10-CM | POA: Diagnosis not present

## 2015-07-20 DIAGNOSIS — M62838 Other muscle spasm: Secondary | ICD-10-CM | POA: Diagnosis not present

## 2015-07-20 DIAGNOSIS — M47812 Spondylosis without myelopathy or radiculopathy, cervical region: Secondary | ICD-10-CM | POA: Diagnosis not present

## 2015-07-20 DIAGNOSIS — M810 Age-related osteoporosis without current pathological fracture: Secondary | ICD-10-CM | POA: Diagnosis not present

## 2015-07-20 DIAGNOSIS — M859 Disorder of bone density and structure, unspecified: Secondary | ICD-10-CM | POA: Diagnosis not present

## 2015-07-20 DIAGNOSIS — J45909 Unspecified asthma, uncomplicated: Secondary | ICD-10-CM | POA: Diagnosis not present

## 2015-07-20 DIAGNOSIS — M0579 Rheumatoid arthritis with rheumatoid factor of multiple sites without organ or systems involvement: Secondary | ICD-10-CM | POA: Diagnosis not present

## 2015-07-20 DIAGNOSIS — M47813 Spondylosis without myelopathy or radiculopathy, cervicothoracic region: Secondary | ICD-10-CM | POA: Diagnosis not present

## 2015-07-20 DIAGNOSIS — Z79899 Other long term (current) drug therapy: Secondary | ICD-10-CM | POA: Diagnosis not present

## 2015-07-20 NOTE — Telephone Encounter (Signed)
Form filled out and given to [place].  Algis Greenhouse. Jerline Pain, [place] Resident PGY-2 07/20/2015 11:32 AM

## 2015-07-20 NOTE — Telephone Encounter (Signed)
PA faxed to [employer] for review.  Review process could take 24-72 hours to complete.  Martin, Tamika L, RN  

## 2015-07-20 NOTE — Telephone Encounter (Signed)
Prior Authorization received from [place] for Januvia 100 mg.  PA form placed in provider box for completion. Derl Barrow, RN

## 2015-07-21 NOTE — Telephone Encounter (Signed)
PA for Januvia was denied via [employer].  Medication is non-formulary.  Patient must have tried and failed: metformin or metformin ER (generic Glucophage XR) and tradjenta. Denial placed in provider box for review.  Reference number: [id number].  Derl Barrow, RN

## 2015-07-24 DIAGNOSIS — M859 Disorder of bone density and structure, unspecified: Secondary | ICD-10-CM | POA: Diagnosis not present

## 2015-07-24 DIAGNOSIS — M810 Age-related osteoporosis without current pathological fracture: Secondary | ICD-10-CM | POA: Diagnosis not present

## 2015-07-24 DIAGNOSIS — M8580 Other specified disorders of bone density and structure, unspecified site: Secondary | ICD-10-CM | POA: Diagnosis not present

## 2015-07-24 DIAGNOSIS — E2839 Other primary ovarian failure: Secondary | ICD-10-CM | POA: Diagnosis not present

## 2015-07-26 ENCOUNTER — Telehealth: Payer: Self-pay | Admitting: Family Medicine

## 2015-07-26 DIAGNOSIS — H9319 Tinnitus, unspecified ear: Secondary | ICD-10-CM

## 2015-07-26 DIAGNOSIS — H903 Sensorineural hearing loss, bilateral: Secondary | ICD-10-CM | POA: Diagnosis not present

## 2015-07-26 NOTE — Telephone Encounter (Signed)
Spoke with patient and informed her that referral has been faxed to Dr. Theressa Millard office. Lurdes Haltiwanger,CMA

## 2015-07-26 NOTE — Telephone Encounter (Signed)
Will forward to MD to place referral for ENT for this patient. Karam Dunson,CMA

## 2015-07-26 NOTE — Telephone Encounter (Signed)
Referral placed.  Algis Greenhouse. Jerline Pain, [place] Resident PGY-2 07/26/2015 10:38 AM

## 2015-07-26 NOTE — Telephone Encounter (Signed)
Pt called because she needs a referral faxed to Dr. Theressa Millard office so that she can make an appointment. The fax number is [phone]. jw

## 2015-07-31 DIAGNOSIS — H903 Sensorineural hearing loss, bilateral: Secondary | ICD-10-CM | POA: Diagnosis not present

## 2015-07-31 DIAGNOSIS — H93A1 Pulsatile tinnitus, right ear: Secondary | ICD-10-CM | POA: Diagnosis not present

## 2015-07-31 DIAGNOSIS — J310 Chronic rhinitis: Secondary | ICD-10-CM | POA: Diagnosis not present

## 2015-08-01 ENCOUNTER — Ambulatory Visit
Admission: RE | Admit: 2015-08-01 | Discharge: 2015-08-01 | Disposition: A | Discharge status: Home / Self Care | Payer: Medicare Other | Source: Ambulatory Visit

## 2015-08-01 DIAGNOSIS — Z1231 Encounter for screening mammogram for malignant neoplasm of breast: Secondary | ICD-10-CM | POA: Diagnosis not present

## 2015-08-07 ENCOUNTER — Telehealth: Payer: Self-pay | Admitting: Family Medicine

## 2015-08-07 ENCOUNTER — Other Ambulatory Visit: Payer: Self-pay | Admitting: Family Medicine

## 2015-08-07 NOTE — Telephone Encounter (Signed)
Pt called and would like to speak to Dr. Jerline Pain about her medications and some of which she wants to stop taking. She is still itching and nothing helps this. Please call patient to discuss this. jw

## 2015-08-08 NOTE — Telephone Encounter (Signed)
Rx filled. If patient is continuing to have symptoms, she needs an office visit.  Michelle [name]. Jerline Pain, [place] Resident PGY-2 08/08/2015 8:39 AM

## 2015-08-08 NOTE — Telephone Encounter (Signed)
Nurse has not received anything as of yet regarding Nasonex.  Derl Barrow, RN

## 2015-08-08 NOTE — Telephone Encounter (Signed)
Patient would also like to know if we have received anything regarding her nasonex.  [facility] medicare informed her that it wasn't on their formulary. Jazmin Hartsell,CMA

## 2015-08-08 NOTE — Telephone Encounter (Signed)
Can you ask her to schedule a visit to talk about her itching and stopping medications? We did not talk about that at our first visit, and I do not see anything on her problem list about itching. We can talk about her Januvia and Nasonex at that time.  Algis Greenhouse. Jerline Pain, [place] Resident PGY-2 08/08/2015 1:40 PM

## 2015-08-08 NOTE — Telephone Encounter (Signed)
Spoke with patient regarding her medication that she would like to stop.  She recently read an article about her xyzal (levocetirizine) and that it could possibly be the cause.  She would like to possibly stop this medication and her gabapentin. Patient would also like to check the status of her Celesta Gentile, will forward to RN team also.  I see that there was a denial form placed in provider's box.  Jazmin Hartsell,CMA

## 2015-08-08 NOTE — Telephone Encounter (Signed)
Patient is aware that script has been refilled. Michelle [name],CMA

## 2015-08-09 NOTE — Telephone Encounter (Signed)
Clld pt - Advsd she will need to make an appt to reevaluate her itching and stopping medications. Pt advsd she will have to call back to make an appt.

## 2015-08-11 ENCOUNTER — Telehealth: Payer: Self-pay | Admitting: *Deleted

## 2015-08-11 NOTE — Telephone Encounter (Signed)
Prior Authorization received from [employer] for [place]. PA form completed online at [email or web address] on 08/10/2015. Review process could take 24-72 hours to complete.   Derl Barrow, RN

## 2015-08-14 ENCOUNTER — Encounter: Payer: Self-pay | Admitting: Family Medicine

## 2015-08-14 ENCOUNTER — Ambulatory Visit (INDEPENDENT_AMBULATORY_CARE_PROVIDER_SITE_OTHER): Payer: Medicare Other | Admitting: Family Medicine

## 2015-08-14 VITALS — BP 105/77 | HR 105 | Temp 97.8°F | Ht 64.0 in | Wt 164.0 lb

## 2015-08-14 DIAGNOSIS — J309 Allergic rhinitis, unspecified: Secondary | ICD-10-CM

## 2015-08-14 DIAGNOSIS — I10 Essential (primary) hypertension: Secondary | ICD-10-CM | POA: Diagnosis not present

## 2015-08-14 DIAGNOSIS — L299 Pruritus, unspecified: Secondary | ICD-10-CM | POA: Insufficient documentation

## 2015-08-14 DIAGNOSIS — K219 Gastro-esophageal reflux disease without esophagitis: Secondary | ICD-10-CM | POA: Diagnosis not present

## 2015-08-14 DIAGNOSIS — G8929 Other chronic pain: Secondary | ICD-10-CM

## 2015-08-14 DIAGNOSIS — Z7189 Other specified counseling: Secondary | ICD-10-CM

## 2015-08-14 DIAGNOSIS — E119 Type 2 diabetes mellitus without complications: Secondary | ICD-10-CM

## 2015-08-14 DIAGNOSIS — L259 Unspecified contact dermatitis, unspecified cause: Secondary | ICD-10-CM | POA: Diagnosis not present

## 2015-08-14 MED ORDER — LOSARTAN POTASSIUM 50 MG PO TABS: 25.0000 mg | ORAL_TABLET | Freq: Every day | ORAL | Status: DC

## 2015-08-14 MED ORDER — LEVOCETIRIZINE DIHYDROCHLORIDE 5 MG PO TABS: 10.0000 mg | ORAL_TABLET | Freq: Every evening | ORAL | Status: DC

## 2015-08-14 MED ORDER — OMEPRAZOLE 40 MG PO CPDR: 40.0000 mg | DELAYED_RELEASE_CAPSULE | Freq: Two times a day (BID) | ORAL | Status: DC

## 2015-08-14 NOTE — Assessment & Plan Note (Signed)
A1c at goal last visit. Has not been on Januvia - waiting on prior authorization. Will continue with metformin only for now. Recheck A1c in 2 months. If elevated, can consider restarting Januvia or other DPP-4 inhibitor.

## 2015-08-14 NOTE — Assessment & Plan Note (Signed)
Unclear etiology. No rashes or signs of infection. Will increase xyzal to 10mg  daily. Has upcoming appointment scheduled with dermatology. Will defer further management to them.

## 2015-08-14 NOTE — Patient Instructions (Signed)
STOP the gabapentin and Januvia.  Increase the xyzal to 10mg  every night.  Decrease your losartan to 25mg  daily - this is half of your current tablet.  We are waiting to hear about your nasal spray.  Please come back in 2 months for your next visit. We will recheck your sugar at that time.  Take care,  Dr Jerline Pain

## 2015-08-14 NOTE — Progress Notes (Signed)
Subjective:  Michelle [name] is a 65 y.o. female who presents to the [facility] today with a chief complaint of medication review.   HPI:  Hypertension BP Readings from Last 3 Encounters:  08/14/15 105/77  07/19/15 127/70  07/04/15 140/81   Home BP monitoring-Yes Compliant with medications-yes, without side effects ROS-Denies any CP, HA, SOB, blurry vision, LE edema, transient weakness, orthopnea, PND.   Allergic Rhinitis Managed with second generation antihistamine and intranasal steroid. Currently on xyzal and Nasonex. Overall stable.   Chronic Pain Currently on percocet and gabapentin. No changes since last visit. Does not feel like gabapentin is helping.   DM Currently on januvia and metformin. A1c last visit 6.6. No polyuria or polydipsia.   GERD Patient reports "severe" reflux that has only been managed in the past with twice daily PPI.  Dermatitis Occasionally gets skin irritation from bra wires. This is treated with ketoconazole.   Itching Diffuse skin pruritis for the past several months. Saw her rheumatologist who put her on hydroxyzine, but feels like it has not helped. No rash. No fevers. No new detergents or exposures. No new soaps. No new foods. Has tried several lotions which has not helped. Has a dermatologist appointment scheduled in 6 weeks.   ROS: Per HPI  PMH:  The following were reviewed and entered/updated in epic: Past Medical History  Diagnosis Date  . RA (rheumatoid arthritis) (HCC)     CCP positive followed by [facility]  . HLD (hyperlipidemia)   . Diabetes mellitus   . Asthma   . Allergy   . Anxiety   . Cataract     beginning on left eye  . GERD (gastroesophageal reflux disease)   . Osteoporosis   . Diverticulosis   . Lung nodules    Patient Active Problem List   Diagnosis Date Noted  . Contact dermatitis 08/14/2015  . Itching 08/14/2015  . Hearing loss 07/19/2015  . Low back pain 12/23/2014  . Squamous cell carcinoma in situ of skin  11/25/2014  . Neck pain 08/05/2014  . Chronic cough 08/05/2014  . Encounter for chronic pain management 08/05/2014  . Aphthous stomatitis 08/03/2014  . Pulsatile tinnitus of right ear 07/05/2014  . Prolonged Q-T interval on ECG 07/05/2014  . Memory difficulties 07/05/2014  . Pulmonary nodules 03/02/2014  . Weight loss, unintentional 03/02/2014  . Trouble swallowing 01/26/2014  . Dry mouth 01/26/2014  . Fine tremor 05/12/2013  . Headache(784.0) 02/04/2013  . Chest pain 02/04/2013  . Essential hypertension 10/30/2012  . Irritable bowel 07/13/2012  . Restless legs 05/14/2012  . Gastroparesis 11/19/2011  . Osteoporosis, unspecified 08/08/2011  . Restrictive lung disease 08/09/2010  . Asthma, chronic 03/14/2010  . Rheumatoid arthritis ([place]) 01/08/2010  . ROTATOR CUFF TEAR 08/30/2009  . CARPAL TUNNEL SYNDROME, LEFT 05/29/2009  . MEDIAL MENISCUS TEAR, LEFT 04/27/2009  . GERD 08/22/2008  . Diabetes mellitus without complication ([place]) [id number]  . HYPERLIPIDEMIA 08/28/2006  . OBESITY, NOS 08/28/2006  . Anxiety and depression 08/28/2006  . Allergic rhinitis 08/28/2006   Past Surgical History  Procedure Laterality Date  . Appendectomy    . Cholecystectomy    . Abdominal hysterectomy    . Carpal tunnel release    . Knee surgery      x2  . Hernia repair    . Shoulder surgery      x2  . Right foot bone spurs removed    . Left and right heart catheterization with coronary angiogram N/A 08/17/2013  Procedure: LEFT AND RIGHT HEART CATHETERIZATION WITH CORONARY ANGIOGRAM;  Surgeon: Laverda Page, MD;  Location: [facility] CATH LAB;  Service: Cardiovascular;  Laterality: N/A;     Objective:  Physical Exam: BP 105/77 mmHg  Pulse 105  Temp(Src) 97.8 F (36.6 C) (Oral)  Ht 5\' 4"  (1.626 m)  Wt 164 lb (74.39 kg)  BMI 28.14 kg/m2  Gen: NAD, resting comfortably CV: RRR with no murmurs appreciated Pulm: NWOB, CTAB with no crackles, wheezes, or rhonchi Skin: warm, dry, several sites  of excoriations Neuro: grossly normal, moves all extremities Psych: Normal affect and thought content  Assessment/Plan:  Essential hypertension BP on low side today. Will decrease losartan to 25mg  daily. Follow up in 2 months.   Allergic rhinitis Continue intranasal steroid - currently waiting on prior authorization for nasonex. Will increased xyzal to 10 mg daily to treat idiopathic pruritis.   Encounter for chronic pain management Stable. Will discontinue gabapentin as patient does not feel like it is doing much for her symptoms. Follow up in 2 months.   Diabetes mellitus without complication 123456 at goal last visit. Has not been on Januvia - waiting on prior authorization. Will continue with metformin only for now. Recheck A1c in 2 months. If elevated, can consider restarting Januvia or other DPP-4 inhibitor.   GERD Refilled omeprazole.   Itching Unclear etiology. No rashes or signs of infection. Will increase xyzal to 10mg  daily. Has upcoming appointment scheduled with dermatology. Will defer further management to them.   Algis Greenhouse. Jerline Pain, [place] Resident PGY-2 08/14/2015 3:42 PM

## 2015-08-14 NOTE — Assessment & Plan Note (Signed)
Continue intranasal steroid - currently waiting on prior authorization for nasonex. Will increased xyzal to 10 mg daily to treat idiopathic pruritis.

## 2015-08-14 NOTE — Telephone Encounter (Signed)
PA was approved.  Derl Barrow, RN

## 2015-08-14 NOTE — Assessment & Plan Note (Signed)
BP on low side today. Will decrease losartan to 25mg  daily. Follow up in 2 months.

## 2015-08-14 NOTE — Assessment & Plan Note (Signed)
Stable. Will discontinue gabapentin as patient does not feel like it is doing much for her symptoms. Follow up in 2 months.

## 2015-08-14 NOTE — Assessment & Plan Note (Signed)
Refilled omeprazole.

## 2015-08-18 ENCOUNTER — Other Ambulatory Visit: Payer: Self-pay | Admitting: Family Medicine

## 2015-08-18 MED ORDER — FLUTICASONE PROPIONATE 50 MCG/ACT NA SUSP: 2.0000 | Freq: Every day | NASAL | Status: DC

## 2015-08-18 NOTE — Progress Notes (Signed)
Patient is aware of this.  States that she received a letter from [employer] agreeing that they would cover her Januvia and wonders if she needs to start back on it.  Michelle [name],CMA

## 2015-08-18 NOTE — Progress Notes (Signed)
We stopped her januvia at our last visit. She can stay off of it until our next follow up appointment.  Algis Greenhouse. Jerline Pain, [place] Resident PGY-2 08/18/2015 5:02 PM

## 2015-08-18 NOTE — Progress Notes (Signed)
Rx for nasonex not approved by patient's insurance. Will send in Fluticasone.  Algis Greenhouse. Jerline Pain, [place] Resident PGY-2 08/18/2015 1:36 PM

## 2015-08-21 NOTE — Progress Notes (Signed)
Patient is aware of this and states that she already picked it up so this way she will have it if needed and her insurance changes their mind on covering it.  Rebecah Dangerfield,CMA

## 2015-08-22 DIAGNOSIS — L299 Pruritus, unspecified: Secondary | ICD-10-CM | POA: Diagnosis not present

## 2015-08-23 ENCOUNTER — Telehealth: Payer: Self-pay | Admitting: Family Medicine

## 2015-08-23 MED ORDER — CETIRIZINE HCL 10 MG PO TABS: 10.0000 mg | ORAL_TABLET | Freq: Every day | ORAL | Status: DC

## 2015-08-23 NOTE — Telephone Encounter (Signed)
Pt called because her medication XYZAL is no longer being covered by her insurance. SHe would like something that is similar called in. jw

## 2015-08-23 NOTE — Telephone Encounter (Signed)
Sent in Rx for zyrtec.  Algis Greenhouse. Jerline Pain, [place] Resident PGY-2 08/23/2015 4:44 PM

## 2015-08-23 NOTE — Telephone Encounter (Signed)
Forward message to PCP

## 2015-08-23 NOTE — Telephone Encounter (Signed)
Pt informed. Michelle [name]  

## 2015-08-24 DIAGNOSIS — L570 Actinic keratosis: Secondary | ICD-10-CM | POA: Diagnosis not present

## 2015-08-24 DIAGNOSIS — Z79899 Other long term (current) drug therapy: Secondary | ICD-10-CM | POA: Diagnosis not present

## 2015-08-24 DIAGNOSIS — L280 Lichen simplex chronicus: Secondary | ICD-10-CM | POA: Diagnosis not present

## 2015-08-24 DIAGNOSIS — L578 Other skin changes due to chronic exposure to nonionizing radiation: Secondary | ICD-10-CM | POA: Diagnosis not present

## 2015-08-24 DIAGNOSIS — Z85828 Personal history of other malignant neoplasm of skin: Secondary | ICD-10-CM | POA: Diagnosis not present

## 2015-08-29 ENCOUNTER — Telehealth: Payer: Self-pay | Admitting: Family Medicine

## 2015-08-29 NOTE — Telephone Encounter (Signed)
Will forward to MD. Cylis Ayars,CMA  

## 2015-08-29 NOTE — Telephone Encounter (Signed)
Being on both medications would increase her risk of serotonin syndrome. She is relatively well controlled on her sertraline currently. There are other options for managing her anxiety, though I would be hesitant to make changes right now given her stability.  If she wants to talk about stopping the sertraline and trying the doxepin, she should come in for an office visit to discuss the risks and benefits of this switch.  Algis Greenhouse. Jerline Pain, [place] Resident PGY-2 08/29/2015 1:25 PM

## 2015-08-29 NOTE — Telephone Encounter (Signed)
Dr. Chari Manning office called to ask that patient be given doxepin in addition to the sertraline.  There is a contraindication btwn the two.  She does need the doxepin for what Dr. Roma Schanz is treating he patient for, but do not want to prescribe due to being on the sertraline.  Can call their office to discuss.

## 2015-09-07 DIAGNOSIS — M0579 Rheumatoid arthritis with rheumatoid factor of multiple sites without organ or systems involvement: Secondary | ICD-10-CM | POA: Diagnosis not present

## 2015-09-07 DIAGNOSIS — Z79899 Other long term (current) drug therapy: Secondary | ICD-10-CM | POA: Diagnosis not present

## 2015-09-12 ENCOUNTER — Telehealth: Payer: Self-pay | Admitting: Gastroenterology

## 2015-09-12 DIAGNOSIS — R197 Diarrhea, unspecified: Secondary | ICD-10-CM

## 2015-09-12 NOTE — Telephone Encounter (Signed)
Pt has been notified to come in and have labs (stool studies)  She will continue imodium and follow up has been scheduled.

## 2015-09-12 NOTE — Telephone Encounter (Signed)
Please have her get labs (stool for c. Diff by toxin and by PCR, routine stool cultures).  It is ok that she continue one imodium daily.  rov with me next available

## 2015-09-12 NOTE — Telephone Encounter (Signed)
Pt has been having abd pain and diarrhea for 1 month, she has 8 diarrhea stools daily.  Abd pain is relieved with bowel movement. She took 1 Imodium and the diarrhea stopped but she didn't continue to take it.  She is concerned about having IBS and taking imodium.  I do not see any history of IBS.  Please advise

## 2015-09-13 ENCOUNTER — Other Ambulatory Visit: Payer: Self-pay | Admitting: Family Medicine

## 2015-09-13 ENCOUNTER — Other Ambulatory Visit: Payer: Self-pay | Admitting: Student

## 2015-09-13 NOTE — Telephone Encounter (Signed)
Clld Dr. Alveria Apley office [phone]: Michelle [name] to Michelle [name] and advised of PCP's notations regarding discontinuing Sertraline and trying doxepin. Also clld pt - LM with her spouse to call the office regarding the medication change.

## 2015-09-14 ENCOUNTER — Other Ambulatory Visit: Payer: Medicare Other

## 2015-09-14 ENCOUNTER — Other Ambulatory Visit: Payer: Self-pay | Admitting: Gastroenterology

## 2015-09-14 DIAGNOSIS — R197 Diarrhea, unspecified: Secondary | ICD-10-CM | POA: Diagnosis not present

## 2015-09-14 NOTE — Telephone Encounter (Signed)
Rx filled.  Algis Greenhouse. Jerline Pain, [place] [facility] Resident PGY-2 09/14/2015 5:40 PM

## 2015-09-15 LAB — CLOSTRIDIUM DIFFICILE BY PCR: Toxigenic C. Difficile by PCR: NOT DETECTED

## 2015-09-18 LAB — STOOL CULTURE

## 2015-09-27 DIAGNOSIS — L280 Lichen simplex chronicus: Secondary | ICD-10-CM | POA: Diagnosis not present

## 2015-09-27 DIAGNOSIS — L578 Other skin changes due to chronic exposure to nonionizing radiation: Secondary | ICD-10-CM | POA: Diagnosis not present

## 2015-09-27 DIAGNOSIS — L570 Actinic keratosis: Secondary | ICD-10-CM | POA: Diagnosis not present

## 2015-09-27 DIAGNOSIS — Z79899 Other long term (current) drug therapy: Secondary | ICD-10-CM | POA: Diagnosis not present

## 2015-09-27 DIAGNOSIS — Z85828 Personal history of other malignant neoplasm of skin: Secondary | ICD-10-CM | POA: Diagnosis not present

## 2015-10-10 ENCOUNTER — Ambulatory Visit (INDEPENDENT_AMBULATORY_CARE_PROVIDER_SITE_OTHER): Payer: Medicare Other | Admitting: Family Medicine

## 2015-10-10 ENCOUNTER — Encounter: Payer: Self-pay | Admitting: Family Medicine

## 2015-10-10 VITALS — BP 127/75 | HR 92 | Temp 98.2°F | Ht 64.0 in | Wt 156.0 lb

## 2015-10-10 DIAGNOSIS — E119 Type 2 diabetes mellitus without complications: Secondary | ICD-10-CM | POA: Diagnosis not present

## 2015-10-10 DIAGNOSIS — E118 Type 2 diabetes mellitus with unspecified complications: Secondary | ICD-10-CM | POA: Diagnosis not present

## 2015-10-10 DIAGNOSIS — E785 Hyperlipidemia, unspecified: Secondary | ICD-10-CM

## 2015-10-10 DIAGNOSIS — Z7189 Other specified counseling: Secondary | ICD-10-CM

## 2015-10-10 DIAGNOSIS — I10 Essential (primary) hypertension: Secondary | ICD-10-CM

## 2015-10-10 DIAGNOSIS — G8929 Other chronic pain: Secondary | ICD-10-CM

## 2015-10-10 LAB — POCT GLYCOSYLATED HEMOGLOBIN (HGB A1C): Hemoglobin A1C: 7.4

## 2015-10-10 MED ORDER — OXYCODONE-ACETAMINOPHEN 10-325 MG PO TABS: 1.0000 | ORAL_TABLET | Freq: Four times a day (QID) | ORAL | Status: DC | PRN

## 2015-10-10 MED ORDER — SITAGLIPTIN PHOSPHATE 100 MG PO TABS: 100.0000 mg | ORAL_TABLET | Freq: Every day | ORAL | Status: DC

## 2015-10-10 NOTE — Assessment & Plan Note (Signed)
Well controlled today. Continue current medications 

## 2015-10-10 NOTE — Progress Notes (Signed)
    Subjective:  Michelle [name] is a 65 y.o. female who presents to the [facility] today with a chief complaint of diabetes follow up and chronic pain.   HPI:  Chronic Pain / RA Pain is overal worse since her last visit. She has an appointment with her rheumatologist tomorrow. Pain has worsened over the last several months and is now having some pain in her neck and back. Pain in her back sometimes radiates into her leg. Was previously on gabapentin, but stopped because she thought it was not helping. No weakness or numbness. No bowel or bladder incontinence. No saddle anesthesia. No fevers or chills.   Diabetes Stopped Januvia 2 months ago due to having A1c at goal. Has noticed increased urinary frequency since then. Has been tolerating metforming without side effects.   Hypertension BP Readings from Last 3 Encounters:  10/10/15 127/75  08/14/15 105/77  07/19/15 127/70   Home BP monitoring-Yes Compliant with medications-yes, without side effects ROS-Denies any CP, HA, SOB, blurry vision, LE edema, transient weakness, orthopnea, PND.   HLD Tolerating crestor without side effects.   ROS: Per HPI  PMH: Smoking history reviewed.    Objective:  Physical Exam: BP 127/75 mmHg  Pulse 92  Temp(Src) 98.2 F (36.8 C) (Oral)  Ht 5\' 4"  (1.626 m)  Wt 156 lb (70.761 kg)  BMI 26.76 kg/m2  Gen: NAD, resting comfortably CV: RRR with no murmurs appreciated. No carotid bruits Pulm: NWOB, CTAB with no crackles, wheezes, or rhonchi Skin: warm, dry MSK - Neck: No deformities. FROM. Tenderness noted along right paraspinal muscles. Neuro: grossly normal, moves all extremities, normal gait Psych: Normal affect and thought content  Results for orders placed or performed in visit on 10/10/15 (from the past 72 hour(s))  POCT glycosylated hemoglobin (Hb A1C)     Status: None   Collection Time: 10/10/15  2:34 PM  Result Value Ref Range   Hemoglobin A1C 7.4    Assessment/Plan:  Encounter for  chronic pain management Oxycodone refilled today. 3 month supply. No red flag signs or symptoms today, however pain is overal worse since last visit. Likely RA contributing. Patient has rheum appointment tomorrow. Can consider imaging vs referral to orthopedics (has seen them in the past) if continues to have severe pain despite adequate RA treatment. Pain clinic referral is a consideration if patient continues to be very problematic.   Diabetes mellitus without complication 123456 elevated to 7.4 today. Will restart [place]. Follow up in 3 months.   Essential hypertension Well controlled today. Continue current medications.   Hyperlipidemia Continue crestor.    Algis Greenhouse. Jerline Pain, [place] Resident PGY-2 10/10/2015 3:25 PM

## 2015-10-10 NOTE — Assessment & Plan Note (Signed)
Oxycodone refilled today. 3 month supply. No red flag signs or symptoms today, however pain is overal worse since last visit. Likely RA contributing. Patient has rheum appointment tomorrow. Can consider imaging vs referral to orthopedics (has seen them in the past) if continues to have severe pain despite adequate RA treatment. Pain clinic referral is a consideration if patient continues to be very problematic.

## 2015-10-10 NOTE — Patient Instructions (Signed)
Your a1c was 7.4 today. We will restart your [place].  We will refill your pain meds  If you are still having pain, please let us know and we can discuss other options.  Please come back in 3 months,  Take care,  Dr Jerline Pain

## 2015-10-10 NOTE — Assessment & Plan Note (Signed)
A1c elevated to 7.4 today. Will restart [place]. Follow up in 3 months.

## 2015-10-10 NOTE — Assessment & Plan Note (Signed)
Continue crestor 

## 2015-10-11 DIAGNOSIS — L308 Other specified dermatitis: Secondary | ICD-10-CM | POA: Diagnosis not present

## 2015-10-11 DIAGNOSIS — M542 Cervicalgia: Secondary | ICD-10-CM | POA: Diagnosis not present

## 2015-10-11 DIAGNOSIS — Z7982 Long term (current) use of aspirin: Secondary | ICD-10-CM | POA: Diagnosis not present

## 2015-10-11 DIAGNOSIS — Z79899 Other long term (current) drug therapy: Secondary | ICD-10-CM | POA: Diagnosis not present

## 2015-10-11 DIAGNOSIS — R0989 Other specified symptoms and signs involving the circulatory and respiratory systems: Secondary | ICD-10-CM | POA: Diagnosis not present

## 2015-10-11 DIAGNOSIS — M25511 Pain in right shoulder: Secondary | ICD-10-CM | POA: Diagnosis not present

## 2015-10-11 DIAGNOSIS — Z7984 Long term (current) use of oral hypoglycemic drugs: Secondary | ICD-10-CM | POA: Diagnosis not present

## 2015-10-11 DIAGNOSIS — M47816 Spondylosis without myelopathy or radiculopathy, lumbar region: Secondary | ICD-10-CM | POA: Diagnosis not present

## 2015-10-11 DIAGNOSIS — M5431 Sciatica, right side: Secondary | ICD-10-CM | POA: Diagnosis not present

## 2015-10-11 DIAGNOSIS — M818 Other osteoporosis without current pathological fracture: Secondary | ICD-10-CM | POA: Diagnosis not present

## 2015-10-11 DIAGNOSIS — M5441 Lumbago with sciatica, right side: Secondary | ICD-10-CM | POA: Diagnosis not present

## 2015-10-11 DIAGNOSIS — E559 Vitamin D deficiency, unspecified: Secondary | ICD-10-CM | POA: Diagnosis not present

## 2015-10-11 DIAGNOSIS — Z85828 Personal history of other malignant neoplasm of skin: Secondary | ICD-10-CM | POA: Diagnosis not present

## 2015-10-11 DIAGNOSIS — Z5181 Encounter for therapeutic drug level monitoring: Secondary | ICD-10-CM | POA: Diagnosis not present

## 2015-10-11 DIAGNOSIS — M8580 Other specified disorders of bone density and structure, unspecified site: Secondary | ICD-10-CM | POA: Diagnosis not present

## 2015-10-11 DIAGNOSIS — M0579 Rheumatoid arthritis with rheumatoid factor of multiple sites without organ or systems involvement: Secondary | ICD-10-CM | POA: Diagnosis not present

## 2015-10-11 DIAGNOSIS — M25512 Pain in left shoulder: Secondary | ICD-10-CM | POA: Diagnosis not present

## 2015-10-13 ENCOUNTER — Other Ambulatory Visit: Payer: Self-pay | Admitting: Family Medicine

## 2015-10-13 DIAGNOSIS — M0579 Rheumatoid arthritis with rheumatoid factor of multiple sites without organ or systems involvement: Secondary | ICD-10-CM | POA: Diagnosis not present

## 2015-10-13 DIAGNOSIS — Z5181 Encounter for therapeutic drug level monitoring: Secondary | ICD-10-CM | POA: Diagnosis not present

## 2015-10-13 DIAGNOSIS — E559 Vitamin D deficiency, unspecified: Secondary | ICD-10-CM | POA: Diagnosis not present

## 2015-10-13 DIAGNOSIS — Z79899 Other long term (current) drug therapy: Secondary | ICD-10-CM | POA: Diagnosis not present

## 2015-10-13 NOTE — Telephone Encounter (Signed)
Rx for albuterol filled. Patient should have refills for klonopin at the pharmacy.  Michelle [name]. Jerline Pain, [place] Resident PGY-2 10/13/2015 4:38 PM

## 2015-10-16 ENCOUNTER — Other Ambulatory Visit: Payer: Self-pay | Admitting: Family Medicine

## 2015-10-16 MED ORDER — OMEPRAZOLE 40 MG PO CPDR: 40.0000 mg | DELAYED_RELEASE_CAPSULE | Freq: Two times a day (BID) | ORAL | Status: DC

## 2015-10-16 MED ORDER — ROSUVASTATIN CALCIUM 10 MG PO TABS: 10.0000 mg | ORAL_TABLET | Freq: Every day | ORAL | Status: DC

## 2015-10-16 MED ORDER — SERTRALINE HCL 50 MG PO TABS: 50.0000 mg | ORAL_TABLET | Freq: Every day | ORAL | Status: DC

## 2015-10-16 MED ORDER — ALBUTEROL SULFATE HFA 108 (90 BASE) MCG/ACT IN AERS: INHALATION_SPRAY | RESPIRATORY_TRACT | Status: DC

## 2015-10-16 NOTE — Telephone Encounter (Signed)
Patient called to say that her refills were sent incorrectly.  Need to have the changes made as follow:  Omeprazole is to be changed to 40 mg taken twice daily for 54m supply  Ventolin is to be changed to 96m supply.  All meds to be changed with the exception of the metformin to 94monhs supply.  Pt is out of her rosuvastatin and her zoloft.

## 2015-10-16 NOTE — Telephone Encounter (Signed)
Rx filled.  Algis Greenhouse. Jerline Pain, [place] Resident PGY-2 10/16/2015 9:26 AM

## 2015-10-16 NOTE — Telephone Encounter (Signed)
Insurance requires a 90 day supply.  Martin, Tamika L, RN  

## 2015-10-16 NOTE — Telephone Encounter (Signed)
Rx filled.  Algis Greenhouse. Jerline Pain, [place] [facility] Resident PGY-2 10/16/2015 12:02 PM

## 2015-11-07 DIAGNOSIS — H35371 Puckering of macula, right eye: Secondary | ICD-10-CM | POA: Diagnosis not present

## 2015-11-07 DIAGNOSIS — H25812 Combined forms of age-related cataract, left eye: Secondary | ICD-10-CM | POA: Diagnosis not present

## 2015-11-07 DIAGNOSIS — H02831 Dermatochalasis of right upper eyelid: Secondary | ICD-10-CM | POA: Diagnosis not present

## 2015-11-07 DIAGNOSIS — H01134 Eczematous dermatitis of left upper eyelid: Secondary | ICD-10-CM | POA: Diagnosis not present

## 2015-11-07 DIAGNOSIS — E113293 Type 2 diabetes mellitus with mild nonproliferative diabetic retinopathy without macular edema, bilateral: Secondary | ICD-10-CM | POA: Diagnosis not present

## 2015-11-07 DIAGNOSIS — E119 Type 2 diabetes mellitus without complications: Secondary | ICD-10-CM | POA: Diagnosis not present

## 2015-11-07 DIAGNOSIS — H02834 Dermatochalasis of left upper eyelid: Secondary | ICD-10-CM | POA: Diagnosis not present

## 2015-11-07 DIAGNOSIS — H2511 Age-related nuclear cataract, right eye: Secondary | ICD-10-CM | POA: Diagnosis not present

## 2015-11-07 LAB — HM DIABETES EYE EXAM

## 2015-11-13 ENCOUNTER — Ambulatory Visit: Payer: Medicare Other | Attending: Physician Assistant

## 2015-11-13 DIAGNOSIS — M5441 Lumbago with sciatica, right side: Secondary | ICD-10-CM | POA: Insufficient documentation

## 2015-11-13 DIAGNOSIS — R293 Abnormal posture: Secondary | ICD-10-CM | POA: Diagnosis not present

## 2015-11-13 NOTE — Therapy (Addendum)
[place], [place], [address] Phone: [phone]   Fax:  [phone]  Physical Therapy Evaluation/Discharge  Patient Details  Name: Michelle [name] MRN: [id number] Date of Birth: December 13, 1950 Referring Provider: Donzetta Kohut [facility]  Encounter Date: 11/13/2015      PT End of Session - 11/13/15 1453    Visit Number 1   Number of Visits 8   Date for PT Re-Evaluation 12/11/15   Authorization Type Medicare   PT Start Time 1145   PT Stop Time 1225   PT Time Calculation (min) 40 min   Activity Tolerance Patient tolerated treatment well   Behavior During Therapy [facility] for tasks assessed/performed      Past Medical History  Diagnosis Date  . RA (rheumatoid arthritis) (HCC)     CCP positive followed by [facility]  . HLD (hyperlipidemia)   . Diabetes mellitus   . Asthma   . Allergy   . Anxiety   . Cataract     beginning on left eye  . GERD (gastroesophageal reflux disease)   . Osteoporosis   . Diverticulosis   . Lung nodules     Past Surgical History  Procedure Laterality Date  . Appendectomy    . Cholecystectomy    . Abdominal hysterectomy    . Carpal tunnel release    . Knee surgery      x2  . Hernia repair    . Shoulder surgery      x2  . Right foot bone spurs removed    . Left and right heart catheterization with coronary angiogram N/A 08/17/2013    Procedure: LEFT AND RIGHT HEART CATHETERIZATION WITH CORONARY ANGIOGRAM;  Surgeon: Laverda Page, MD;  Location: [facility] CATH LAB;  Service: Cardiovascular;  Laterality: N/A;    There were no vitals filed for this visit.       Subjective Assessment - 11/13/15 1153    Subjective LBP with sciatica. RT leg and hip pain. . Pain over 6 months.   Her pain is chronic .  Nothing changes when pain increased.  She reports MD said  she had to have PT before she was sent to specialist. She has been to PT in past  for other ortho issues.    Patient is accompained  by: Family member   Pertinent History 2 RTC surgery 1 on RT.   No back surgery. No joint replacements.    Limitations --  Does less home tasks limiting sweeping and mopping now. Bending over    difficulty walking steps an dstumbles at times   How long can you sit comfortably? 10 min   How long can you stand comfortably? 20 min   How long can you walk comfortably? < 1/4 mile   Diagnostic tests Xray: OA and wedging   Patient Stated Goals For pain to go away.   Currently in Pain? Yes   Pain Score 8    Pain Location Back   Pain Orientation Right;Left;Lower   Pain Descriptors / Indicators Throbbing   Pain Type Chronic pain   Pain Radiating Towards RT lateral hip and anterior RT leg. Made worse on feet and steps.    Pain Onset More than a month ago   Pain Frequency Constant   Aggravating Factors  Bending , sitting in car, sweeping /mopping.     Pain Relieving Factors lying down , medication, heat   Multiple Pain Sites --  She has multiple areas of pain but we  will address LBP            [facility] PT Assessment - 11/13/15 0001    Assessment   Medical Diagnosis LBP with RT sciatica   Referring Provider [facility]ut [facility]   Onset Date/Surgical Date --  6 months ago   Next MD Visit 12/2015   Prior Therapy No   Precautions   Precautions None   Restrictions   Weight Bearing Restrictions No   Balance Screen   Has the patient fallen in the past 6 months No   Has the patient had a decrease in activity level because of a fear of falling?  Yes  due to pain and decreased balance   Is the patient reluctant to leave their home because of a fear of falling?  No   Home Ecologist residence   Living Arrangements Spouse/significant other;Other relatives   Type of [place] to enter   Entrance Stairs-Number of Steps 3   Entrance Stairs-Rails Right   Home Layout One level   Prior Function   Level of Independence Other (comment)  asssist on  stairs   Cognition   Overall Cognitive Status Within Functional Limits for tasks assessed   Posture/Postural Control   Posture Comments Incr thoracic yphosis, decr lumbar lordosis,   noted skin areas    ROM / Strength   AROM / PROM / Strength AROM;Strength   AROM   Overall AROM Comments Hip motion WNL but with pain on Rotation RT hip and adduction .       AROM Assessment Site Lumbar   Lumbar Flexion She was able to touch her ankles     Lumbar Extension 15   Lumbar - Right Side Bend 10   Lumbar - Left Side Bend 15   Strength   Overall Strength Comments WNL both LE   Flexibility   Soft Tissue Assessment /Muscle Length yes   Hamstrings 60 degrees RT and LT    Palpation   Palpation comment Tender across lower lumbar spine and RT glute medius . Spine stiff with PA mobs.    Ambulation/Gait   Gait Comments WNL                           PT Education - 11/13/15 1238    Education provided Yes   Education Details POC , HEP , medicaid coverage   Person(s) Educated Patient;Other (comment)   Methods Explanation;Tactile cues;Verbal cues;Handout   Comprehension Returned demonstration;Verbalized understanding             PT Long Term Goals - 11/13/15 1459    PT LONG TERM GOAL #1   Title She will be independent with inital HEP   Time 4   Period Weeks   Status New   PT LONG TERM GOAL #2   Title She reports pain eased in back 25% or more allowing  increased tolerance for sweeping and mopping   Time 4   Period Weeks   Status New   PT LONG TERM GOAL #3   Title She willunderstand fall protection and possible use of assistive device   Time 4   Period Weeks   Status New               Plan - 11/13/15 1454    Clinical Impression Statement Ms Lesko presents with moderate complexity evaluation due to multiple body parts , worsening symptoms  LBP  with pain into RT leg limiting her abiity to do her normal activity needing help at time for stairs and other  activity. She reports increased instability. She reports worsening of symptoms. She reported at end she did not think she could afford more than 1 visit of PT. I will wait to hear her descision about coming to PT as I think she will need this to improve.   PT Frequency 2x / week   PT Duration 4 weeks   PT Treatment/Interventions Electrical Stimulation;Moist Heat;Therapeutic exercise;Manual techniques;Taping;Patient/family education;Passive range of motion;Ultrasound;Balance training   PT Next Visit Plan REview HEp and add core stab and strengthing HEP, manual if she choses to come 6-8 visits, modalities   PT Home Exercise Plan knee to chest and post  pelvic tilt, posture and mechanics hand out   Consulted and Agree with Plan of Care Patient      Patient will benefit from skilled therapeutic intervention in order to improve the following deficits and impairments:  Decreased balance, Pain, Decreased activity tolerance, Increased muscle spasms, Difficulty walking, Decreased range of motion  Visit Diagnosis: Bilateral low back pain with right-sided sciatica - Plan: PT plan of care cert/re-cert  Abnormal posture - Plan: PT plan of care cert/re-cert      G-Codes - 88/50/27 1500    Functional Assessment Tool Used FOTO 55% limited   Functional Limitation Other PT primary   Other PT Primary Current Status (X4128) At least 40 percent but less than 60 percent impaired, limited or restricted   Other PT Primary Goal Status (N8676) At least 20 percent but less than 40 percent impaired, limited or restricted     G code   Discharge At least 40%  But less than 60%               She did not return after eval  Problem List Patient Active Problem List   Diagnosis Date Noted  . Contact dermatitis 08/14/2015  . Itching 08/14/2015  . Hearing loss 07/19/2015  . Low back pain 12/23/2014  . Squamous cell carcinoma in situ of skin 11/25/2014  . Neck pain 08/05/2014  . Chronic cough 08/05/2014  . Encounter  for chronic pain management 08/05/2014  . Aphthous stomatitis 08/03/2014  . Pulsatile tinnitus of right ear 07/05/2014  . Prolonged Q-T interval on ECG 07/05/2014  . Memory difficulties 07/05/2014  . Pulmonary nodules 03/02/2014  . Weight loss, unintentional 03/02/2014  . Trouble swallowing 01/26/2014  . Dry mouth 01/26/2014  . Fine tremor 05/12/2013  . Headache(784.0) 02/04/2013  . Chest pain 02/04/2013  . Essential hypertension 10/30/2012  . Irritable bowel 07/13/2012  . Restless legs 05/14/2012  . Gastroparesis 11/19/2011  . Osteoporosis, unspecified 08/08/2011  . Restrictive lung disease 08/09/2010  . Asthma, chronic 03/14/2010  . Rheumatoid arthritis ([place]) 01/08/2010  . ROTATOR CUFF TEAR 08/30/2009  . CARPAL TUNNEL SYNDROME, LEFT 05/29/2009  . MEDIAL MENISCUS TEAR, LEFT 04/27/2009  . GERD 08/22/2008  . Diabetes mellitus without complication ([place]) 72/03/4708  . Hyperlipidemia 08/28/2006  . OBESITY, NOS 08/28/2006  . Anxiety and depression 08/28/2006  . Allergic rhinitis 08/28/2006    Darrel Hoover  PT 11/13/2015, 3:06 PM  [facility] [address] [place], [place], [address] Phone: [phone]   Fax:  [phone]  Name: PRESCIOUS HURLESS MRN: [id number] Date of Birth: 1951-06-14   PHYSICAL THERAPY DISCHARGE SUMMARY  Visits from Start of Care: Eval only  Current functional level related to goals / functional outcomes:  See above   Remaining deficits: See above   Education / Equipment: HEP Plan: Patient agrees to discharge.  Patient goals were not met. Patient is being discharged due to financial reasons.  ?????   Lillette Boxer Rich Paprocki   PT                 11/29/15                 2:09 PM

## 2015-11-13 NOTE — Patient Instructions (Signed)

## 2015-11-15 ENCOUNTER — Encounter: Payer: Self-pay | Admitting: Gastroenterology

## 2015-11-15 ENCOUNTER — Ambulatory Visit (INDEPENDENT_AMBULATORY_CARE_PROVIDER_SITE_OTHER): Payer: Medicare Other | Admitting: Gastroenterology

## 2015-11-15 VITALS — BP 106/70 | HR 62 | Ht 64.0 in | Wt 163.0 lb

## 2015-11-15 DIAGNOSIS — R194 Change in bowel habit: Secondary | ICD-10-CM | POA: Diagnosis not present

## 2015-11-15 NOTE — Patient Instructions (Signed)
Please start taking citrucel (orange flavored) powder fiber supplement.  This may cause some bloating at first but that usually goes away. Begin with a small spoonful and work your way up to a large, heaping spoonful daily over a week. Call in 5-6 weeks to report on your response. 

## 2015-11-15 NOTE — Progress Notes (Signed)
Review of pertinent gastrointestinal problems:  1. EGD 2006 with Dr. Cristina Gong: This was done for Hemoccult-positive stool. He wrote about bile reflux into stomach and esophagus, possible minimal Barrett's esophagus. Biopsies of the GE junction showed mild inflammation only without any intestinal metaplasia. EGD Dr. Ardis Hughs (dyspahgia) mild pan gastritis, hpylori ++, treated. 2. Elevated risk for colon cancer (FH +): Colonoscopy January 2012 findings diverticulosis but was otherwise normal. Dr. Cristina Gong recommended she have a repeat colonoscopy at 10 year interval for her routine risk screening. Shortly after that her daughter was diagnosed with colon cancer which puts her at increased risk and recall was adjusted to 5 year interval. Colonoscopy 03/2014 Dr. Ardis Hughs (change in bowels) 2 small TAs, recall recommended at 5 years. 3. dysphagia like symptoms. April 2013 EGD by Dr. Ardis Hughs. Mild gastritis, H. pylori negative on biopsy, retained solid and liquid food without anatomic gastric outlet obstruction, this suggested gastroparesis. DM and daily narcotic medicines likely contribute,  4. abd pain 7/13: cbc, cmet, amylas, lipase were normal: CT scan abd pelvis with IV and PO contrast   HPI: This is a  very pleasant 65 year old woman whom I last saw 1 or 2 years ago. She is here with her granddaughter today.  Chief complaint is alternating constipation diarrhea  Had a mont straight of diarrhea; 08/2015 stool for c. Diff and routine culture were all negative.  She has a long history of alternating bowel pattern.  Can have days urgent loose stools, followed by several days of no BM at all. Can go 3-4 days in a row without BMs.  Has not tried imodium; tried fiber supplements for several days in a row.  This did improve her constipation.  Overall weight up and down.  She has had no overt bleeding. She has no significant abdominal pains  Past Medical History  Diagnosis Date  . RA (rheumatoid arthritis)  (HCC)     CCP positive followed by [facility]  . HLD (hyperlipidemia)   . Diabetes mellitus   . Asthma   . Allergy   . Anxiety   . Cataract     beginning on left eye  . GERD (gastroesophageal reflux disease)   . Osteoporosis   . Diverticulosis   . Lung nodules     Past Surgical History  Procedure Laterality Date  . Appendectomy    . Cholecystectomy    . Abdominal hysterectomy    . Carpal tunnel release    . Knee surgery      x2  . Hernia repair    . Shoulder surgery      x2  . Right foot bone spurs removed    . Left and right heart catheterization with coronary angiogram N/A 08/17/2013    Procedure: LEFT AND RIGHT HEART CATHETERIZATION WITH CORONARY ANGIOGRAM;  Surgeon: Laverda Page, MD;  Location: [facility] CATH LAB;  Service: Cardiovascular;  Laterality: N/A;    Current Outpatient Prescriptions  Medication Sig Dispense Refill  . adalimumab (HUMIRA) 40 MG/0.8ML injection Inject 40 mg into the skin every 14 (fourteen) days.    Marland Kitchen albuterol (VENTOLIN HFA) 108 (90 Base) MCG/ACT inhaler INHALE TWO PUFFS BY MOUTH EVERY 6 HOURS AS NEEDED FOR WHEEZING FOR SHORTNESS OF BREATH 2 Inhaler 1  . aspirin EC 81 MG tablet Take 81 mg by mouth daily.    . beclomethasone (QVAR) 40 MCG/ACT inhaler Inhale 2 puffs into the lungs 2 (two) times daily. 1 Inhaler 11  . cetirizine (ZYRTEC) 10 MG tablet Take 1 tablet (10  mg total) by mouth daily. 30 tablet 11  . clonazePAM (KLONOPIN) 1 MG tablet Take 1 tablet (1 mg total) by mouth 2 (two) times daily as needed for anxiety. 60=30 day supply. No early refills. 60 tablet 5  . diclofenac sodium (VOLTAREN) 1 % GEL Apply 2 g topically 4 (four) times daily as needed (for pain).    . fluticasone (FLONASE) 50 MCG/ACT nasal spray Place 2 sprays into both nostrils daily. 16 g 6  . leflunomide (ARAVA) 10 MG tablet Take 10 mg by mouth daily.    Marland Kitchen losartan (COZAAR) 50 MG tablet Take 0.5 tablets (25 mg total) by mouth daily. 90 tablet 3  . metFORMIN (GLUCOPHAGE) 1000 MG  tablet TAKE ONE TABLET BY MOUTH TWICE DAILY WITH MEALS 60 tablet 2  . nitroGLYCERIN (NITROSTAT) 0.4 MG SL tablet Place 0.4 mg under the tongue every 5 (five) minutes as needed.      . Olopatadine HCl 0.2 % SOLN Apply 1 drop to eye daily. 2.5 mL 1  . omeprazole (PRILOSEC) 40 MG capsule Take 1 capsule (40 mg total) by mouth 2 (two) times daily. 180 capsule 3  . oxyCODONE-acetaminophen (PERCOCET) 10-325 MG tablet Take 1 tablet by mouth every 6 (six) hours as needed for pain. 120 tablet 0  . oxyCODONE-acetaminophen (PERCOCET) 10-325 MG tablet Take 1 tablet by mouth every 6 (six) hours as needed for pain. 120 tablet 0  . oxyCODONE-acetaminophen (PERCOCET) 10-325 MG tablet Take 1 tablet by mouth every 6 (six) hours as needed for pain. 120 tablet 0  . rosuvastatin (CRESTOR) 10 MG tablet Take 1 tablet (10 mg total) by mouth daily. 90 tablet 3  . sertraline (ZOLOFT) 50 MG tablet Take 1 tablet (50 mg total) by mouth daily. 90 tablet 3  . sitaGLIPtin (JANUVIA) 100 MG tablet Take 1 tablet (100 mg total) by mouth daily. 30 tablet 5  . triamcinolone cream (KENALOG) 0.1 % APPLY CREAM EXTERNALLY TO AFFECTED AREA AS NEEDED 80 g 0   No current facility-administered medications for this visit.    Allergies as of 11/15/2015 - Review Complete 11/15/2015  Allergen Reaction Noted  . Ace inhibitors Cough 05/31/2014  . Sulfonamide derivatives Hives and Itching     Family History  Problem Relation Age of Onset  . Stroke Father 43    died  . Lung cancer Brother 73    died  . Rheum arthritis Sister 24  . Colon cancer Daughter   . Colon cancer Maternal Aunt   . Esophageal cancer Neg Hx   . Rectal cancer Neg Hx   . Stomach cancer Neg Hx   . COPD Sister   . Cancer Sister     in nose    Social History   Social History  . Marital Status: Married    Spouse Name: N/A  . Number of Children: 3  . Years of Education: N/A   Occupational History  . disabled   .     Social History Main Topics  . Smoking  status: Former Smoker -- 1.50 packs/day for 5 years    Types: Cigarettes    Quit date: 05/31/1994  . Smokeless tobacco: Never Used  . Alcohol Use: No  . Drug Use: No  . Sexual Activity: Not on file   Other Topics Concern  . Not on file   Social History Narrative   Pt is on [employer] program [place]   Lots of family stressors including grandaughter who lives with her  Physical Exam: BP 106/70 mmHg  Pulse 62  Ht 5\' 4"  (1.626 m)  Wt 163 lb (73.936 kg)  BMI 27.97 kg/m2 Constitutional: generally well-appearing Psychiatric: alert and oriented x3 Abdomen: soft, nontender, nondistended, no obvious ascites, no peritoneal signs, normal bowel sounds   Assessment and plan: 65 y.o. female with alternating constipation, diarrhea  Daily narcotic pain medicines likely play a role in her bowel irregularity. Recommended she try to cut back as best she can. Fiber supplements are very helpful usually in this circumstance. She is going to try Citrucel on a daily basis and she will call to report on her response in 5 or 6 weeks. I see no reason for any further testing at this point.  Owens Loffler, MD [facility] 11/15/2015, 9:07 AM

## 2015-11-20 DIAGNOSIS — H269 Unspecified cataract: Secondary | ICD-10-CM | POA: Diagnosis not present

## 2015-11-20 DIAGNOSIS — H2512 Age-related nuclear cataract, left eye: Secondary | ICD-10-CM | POA: Diagnosis not present

## 2015-11-24 ENCOUNTER — Other Ambulatory Visit: Payer: Self-pay | Admitting: *Deleted

## 2015-11-24 MED ORDER — METFORMIN HCL 1000 MG PO TABS: 1000.0000 mg | ORAL_TABLET | Freq: Two times a day (BID) | ORAL | Status: DC

## 2015-11-24 MED ORDER — ALBUTEROL SULFATE HFA 108 (90 BASE) MCG/ACT IN AERS: INHALATION_SPRAY | RESPIRATORY_TRACT | Status: DC

## 2015-11-24 NOTE — Telephone Encounter (Signed)
Refill for 90 day supply.  Delanee Xin L, RN  

## 2015-11-24 NOTE — Telephone Encounter (Signed)
Rx filled.  Algis Greenhouse. Jerline Pain, [place] Resident PGY-2 11/24/2015 4:51 PM

## 2015-11-25 DIAGNOSIS — R05 Cough: Secondary | ICD-10-CM | POA: Diagnosis not present

## 2015-11-25 DIAGNOSIS — R062 Wheezing: Secondary | ICD-10-CM | POA: Diagnosis not present

## 2015-11-25 DIAGNOSIS — R0689 Other abnormalities of breathing: Secondary | ICD-10-CM | POA: Diagnosis not present

## 2015-12-26 ENCOUNTER — Other Ambulatory Visit: Payer: Self-pay | Admitting: *Deleted

## 2015-12-26 MED ORDER — TRIAMCINOLONE ACETONIDE 0.1 % EX CREA: TOPICAL_CREAM | CUTANEOUS | Status: DC

## 2015-12-27 ENCOUNTER — Telehealth: Payer: Self-pay | Admitting: Family Medicine

## 2015-12-27 ENCOUNTER — Other Ambulatory Visit: Payer: Self-pay | Admitting: *Deleted

## 2015-12-27 MED ORDER — ALBUTEROL SULFATE HFA 108 (90 BASE) MCG/ACT IN AERS: INHALATION_SPRAY | RESPIRATORY_TRACT | Status: DC

## 2015-12-27 NOTE — Telephone Encounter (Signed)
It is ok to change the order to paste.  Michelle [name]. Jerline Pain, [place] [facility] Resident PGY-2 12/27/2015 5:23 PM

## 2015-12-27 NOTE — Telephone Encounter (Signed)
Pt called because the Kenalog cream we sent in on 12/26/15 should have been Kenalog 0.1 Paste so that she can use in her mouth. Can we call the pharmacy and change this to the 0.1 paste. jw

## 2015-12-28 NOTE — Telephone Encounter (Signed)
Change made with pharmacy and they will contact the patient when her medication is ready to pick up. Treasa Bradshaw,CMA

## 2016-01-03 ENCOUNTER — Ambulatory Visit (INDEPENDENT_AMBULATORY_CARE_PROVIDER_SITE_OTHER): Payer: Medicare Other | Admitting: Family Medicine

## 2016-01-03 ENCOUNTER — Encounter: Payer: Self-pay | Admitting: Family Medicine

## 2016-01-03 VITALS — BP 129/66 | HR 99 | Temp 98.1°F | Ht 64.0 in | Wt 162.0 lb

## 2016-01-03 DIAGNOSIS — E119 Type 2 diabetes mellitus without complications: Secondary | ICD-10-CM

## 2016-01-03 DIAGNOSIS — E785 Hyperlipidemia, unspecified: Secondary | ICD-10-CM

## 2016-01-03 DIAGNOSIS — Z7189 Other specified counseling: Secondary | ICD-10-CM

## 2016-01-03 DIAGNOSIS — G8929 Other chronic pain: Secondary | ICD-10-CM

## 2016-01-03 DIAGNOSIS — F419 Anxiety disorder, unspecified: Secondary | ICD-10-CM

## 2016-01-03 DIAGNOSIS — I10 Essential (primary) hypertension: Secondary | ICD-10-CM

## 2016-01-03 DIAGNOSIS — F418 Other specified anxiety disorders: Secondary | ICD-10-CM | POA: Diagnosis not present

## 2016-01-03 DIAGNOSIS — F329 Major depressive disorder, single episode, unspecified: Secondary | ICD-10-CM

## 2016-01-03 LAB — POCT GLYCOSYLATED HEMOGLOBIN (HGB A1C): HEMOGLOBIN A1C: 7

## 2016-01-03 MED ORDER — ALBUTEROL SULFATE HFA 108 (90 BASE) MCG/ACT IN AERS: INHALATION_SPRAY | RESPIRATORY_TRACT | Status: DC

## 2016-01-03 MED ORDER — OXYCODONE-ACETAMINOPHEN 10-325 MG PO TABS: 1.0000 | ORAL_TABLET | Freq: Four times a day (QID) | ORAL | Status: DC | PRN

## 2016-01-03 MED ORDER — CETIRIZINE HCL 10 MG PO TABS: 10.0000 mg | ORAL_TABLET | Freq: Every day | ORAL | Status: DC

## 2016-01-03 MED ORDER — CLONAZEPAM 1 MG PO TABS: 1.0000 mg | ORAL_TABLET | Freq: Two times a day (BID) | ORAL | Status: DC | PRN

## 2016-01-03 MED ORDER — SITAGLIPTIN PHOSPHATE 100 MG PO TABS: 100.0000 mg | ORAL_TABLET | Freq: Every day | ORAL | Status: DC

## 2016-01-03 MED ORDER — GLUCOSE BLOOD VI STRP: ORAL_STRIP | Status: DC

## 2016-01-03 NOTE — Assessment & Plan Note (Signed)
Anxiety stable. Will refill klonopin and Zoloft today. Would likely benefit from titration off benzos given concurrent narcotic use. Check GAD and PHQ-9 at next office visit.

## 2016-01-03 NOTE — Assessment & Plan Note (Signed)
A1c improved to 7.0 today. Continue januvia and metformin. Follow up in 3 months.

## 2016-01-03 NOTE — Patient Instructions (Signed)
Your A1c was 7.0 today. We will not make any changes to your medications today.  I sent in 6-month supplies for your medications.  Please come back to see me in 3 months, or sooner if you need anything else.   Take care,  Dr Jerline Pain

## 2016-01-03 NOTE — Assessment & Plan Note (Signed)
Oxycodone refilled today. 3 month supply. No red flag signs or symptoms today. Pain is overall stable since last visit. Pain clinic referral is a consideration if patient's pain not controlled with current dose of narcotics. Can also consider adding other agents such as lyrica or TCA to current regimen.

## 2016-01-03 NOTE — Assessment & Plan Note (Signed)
At goal.  Continue losartan. 

## 2016-01-03 NOTE — Progress Notes (Addendum)
    Subjective:  ELEASE LEGAN is a 65 y.o. female who presents to the [facility] today with a chief complaint of chronic pain follow up.   HPI:  Chronic Pain Pain overall stable since last visit. Pain in hands, feet, and back secondary to RA. Currently on percocet that helps with the pain. Was on gabapentin in the past, but discontinued because it was not helping. No weakness or numbness. No bowel or bladder incontinence. No saddle anesthesia. No fevers or chills.   T2DM Currently on januvia and metformin. Tolerating both without side effects. No polyuria or polydipsia.   Hypertension BP Readings from Last 3 Encounters:  01/03/16 129/66  11/15/15 106/70  10/10/15 127/75   Home BP monitoring-Yes Compliant with medications-yes, without side effects ROS-Denies any CP, HA, SOB, blurry vision, LE edema, transient weakness, orthopnea, PND.   HLD Tolerating crestor without side effects.   Anxiety On clonopin and zoloft. Tolerating well without side effects.   ROS: Per HPI  PMH: Smoking history reviewed.    Objective:  Physical Exam: BP 129/66 mmHg  Pulse 99  Temp(Src) 98.1 F (36.7 C) (Oral)  Ht 5\' 4"  (1.626 m)  Wt 162 lb (73.483 kg)  BMI 27.79 kg/m2  Gen: NAD, resting comfortably CV: RRR with no murmurs appreciated Pulm: NWOB, CTAB with no crackles, wheezes, or rhonchi MSK: no edema, cyanosis, or clubbing noted Skin: warm, dry Neuro: grossly normal, moves all extremities Psych: Normal affect and thought content  Results for orders placed or performed in visit on 01/03/16 (from the past 72 hour(s))  POCT A1C     Status: None   Collection Time: 01/03/16  1:45 PM  Result Value Ref Range   Hemoglobin A1C 7.0    Assessment/Plan:  Encounter for chronic pain management Oxycodone refilled today. 3 month supply. No red flag signs or symptoms today. Pain is overall stable since last visit. Pain clinic referral is a consideration if patient's pain not controlled with current  dose of narcotics. Can also consider adding other agents such as lyrica or TCA to current regimen.     Diabetes mellitus without complication (HCC) 123456 improved to 7.0 today. Continue januvia and metformin. Follow up in 3 months.     Essential hypertension At goal. Continue losartan.   Hyperlipidemia Continue crestor.     Anxiety and depression Anxiety stable. Will refill klonopin and Zoloft today. Would likely benefit from titration off benzos given concurrent narcotic use. Check GAD and PHQ-9 at next office visit.    Algis Greenhouse. Jerline Pain, [place] [facility] Resident PGY-3 01/03/2016 2:47 PM

## 2016-01-03 NOTE — Assessment & Plan Note (Signed)
Continue crestor 

## 2016-01-04 ENCOUNTER — Other Ambulatory Visit: Payer: Self-pay | Admitting: *Deleted

## 2016-01-04 MED ORDER — FLUTICASONE PROPIONATE 50 MCG/ACT NA SUSP: 2.0000 | Freq: Every day | NASAL | Status: DC

## 2016-01-04 NOTE — Telephone Encounter (Addendum)
Will forward to PCP, not sure why diagnostic code is needed.  I did not see any diabetic supplies sent in.  Derl Barrow, RN   Received a fax from [place] needing diagnostic code for test strips for medicare billing.  Standardized DM form completed and signed by Dr. Gwendlyn Deutscher.  Form faxed to [facility].  Derl Barrow, RN

## 2016-01-04 NOTE — Telephone Encounter (Signed)
Three month supply for flonase sent in. Discussed percocet prescription with pharmacist, said it was an Futures trader and that the prescription went through.  Algis Greenhouse. Jerline Pain, [place] Resident PGY-3 01/04/2016 4:22 PM

## 2016-01-04 NOTE — Telephone Encounter (Signed)
[place], Pharmacist with [facility] called stating that prescriber is not authorized to prescribe schedule two control drugs.  Patient would need a new Rx for oxyCODONE-acetaminophen (PERCOCET) 10-325 MG tablet.  Please give him a call at [phone]. Derl Barrow, RN

## 2016-01-04 NOTE — Telephone Encounter (Signed)
Pt needs a diagnostic code for her meds dr Jerline Pain sent in yesterday. Deseree Kennon Holter, CMA

## 2016-01-05 ENCOUNTER — Telehealth: Payer: Self-pay | Admitting: *Deleted

## 2016-01-05 ENCOUNTER — Telehealth: Payer: Self-pay | Admitting: Family Medicine

## 2016-01-05 DIAGNOSIS — E119 Type 2 diabetes mellitus without complications: Secondary | ICD-10-CM

## 2016-01-05 MED ORDER — GLUCOSE BLOOD VI STRP: ORAL_STRIP | Status: DC

## 2016-01-05 NOTE — Telephone Encounter (Signed)
Form was faxed yesterday 01/04/16 and again today.  Derl Barrow, RN

## 2016-01-05 NOTE — Telephone Encounter (Signed)
Pt is calling because the pharmacy can not fill her test strips until we re-fax the prescription with the diagnoses code on it. Please do so that she can get her test strips. jw

## 2016-01-05 NOTE — Telephone Encounter (Signed)
Received another fax from [place] requesting ICD-10 for the test strips.  Standardized DM form has been faxed several times. Verbal order given by Dr. Ree Kida to send electronically.  Derl Barrow, RN

## 2016-01-07 ENCOUNTER — Other Ambulatory Visit: Payer: Self-pay | Admitting: Family Medicine

## 2016-01-08 DIAGNOSIS — H02834 Dermatochalasis of left upper eyelid: Secondary | ICD-10-CM | POA: Diagnosis not present

## 2016-01-08 DIAGNOSIS — H02831 Dermatochalasis of right upper eyelid: Secondary | ICD-10-CM | POA: Diagnosis not present

## 2016-01-18 DIAGNOSIS — I10 Essential (primary) hypertension: Secondary | ICD-10-CM | POA: Diagnosis not present

## 2016-01-18 DIAGNOSIS — K219 Gastro-esophageal reflux disease without esophagitis: Secondary | ICD-10-CM | POA: Diagnosis not present

## 2016-01-18 DIAGNOSIS — J4520 Mild intermittent asthma, uncomplicated: Secondary | ICD-10-CM | POA: Diagnosis not present

## 2016-01-18 DIAGNOSIS — Z7984 Long term (current) use of oral hypoglycemic drugs: Secondary | ICD-10-CM | POA: Diagnosis not present

## 2016-01-18 DIAGNOSIS — F329 Major depressive disorder, single episode, unspecified: Secondary | ICD-10-CM | POA: Diagnosis not present

## 2016-01-18 DIAGNOSIS — M069 Rheumatoid arthritis, unspecified: Secondary | ICD-10-CM | POA: Diagnosis not present

## 2016-01-18 DIAGNOSIS — E119 Type 2 diabetes mellitus without complications: Secondary | ICD-10-CM | POA: Diagnosis not present

## 2016-01-18 DIAGNOSIS — E785 Hyperlipidemia, unspecified: Secondary | ICD-10-CM | POA: Diagnosis not present

## 2016-01-18 DIAGNOSIS — Z7982 Long term (current) use of aspirin: Secondary | ICD-10-CM | POA: Diagnosis not present

## 2016-01-18 DIAGNOSIS — J45991 Cough variant asthma: Secondary | ICD-10-CM | POA: Diagnosis not present

## 2016-01-25 DIAGNOSIS — H02834 Dermatochalasis of left upper eyelid: Secondary | ICD-10-CM | POA: Diagnosis not present

## 2016-01-25 DIAGNOSIS — H5340 Unspecified visual field defects: Secondary | ICD-10-CM | POA: Diagnosis not present

## 2016-01-25 DIAGNOSIS — H02831 Dermatochalasis of right upper eyelid: Secondary | ICD-10-CM | POA: Diagnosis not present

## 2016-02-01 DIAGNOSIS — M7062 Trochanteric bursitis, left hip: Secondary | ICD-10-CM | POA: Diagnosis not present

## 2016-02-01 DIAGNOSIS — Z7289 Other problems related to lifestyle: Secondary | ICD-10-CM | POA: Diagnosis not present

## 2016-02-01 DIAGNOSIS — M85841 Other specified disorders of bone density and structure, right hand: Secondary | ICD-10-CM | POA: Diagnosis not present

## 2016-02-01 DIAGNOSIS — Z1159 Encounter for screening for other viral diseases: Secondary | ICD-10-CM | POA: Diagnosis not present

## 2016-02-01 DIAGNOSIS — M8589 Other specified disorders of bone density and structure, multiple sites: Secondary | ICD-10-CM | POA: Diagnosis not present

## 2016-02-01 DIAGNOSIS — Z111 Encounter for screening for respiratory tuberculosis: Secondary | ICD-10-CM | POA: Diagnosis not present

## 2016-02-01 DIAGNOSIS — M0579 Rheumatoid arthritis with rheumatoid factor of multiple sites without organ or systems involvement: Secondary | ICD-10-CM | POA: Diagnosis not present

## 2016-02-01 DIAGNOSIS — Z79899 Other long term (current) drug therapy: Secondary | ICD-10-CM | POA: Diagnosis not present

## 2016-02-01 DIAGNOSIS — M85842 Other specified disorders of bone density and structure, left hand: Secondary | ICD-10-CM | POA: Diagnosis not present

## 2016-02-01 DIAGNOSIS — M069 Rheumatoid arthritis, unspecified: Secondary | ICD-10-CM | POA: Diagnosis not present

## 2016-02-05 ENCOUNTER — Telehealth: Payer: Self-pay | Admitting: Family Medicine

## 2016-02-05 DIAGNOSIS — E118 Type 2 diabetes mellitus with unspecified complications: Secondary | ICD-10-CM

## 2016-02-05 NOTE — Telephone Encounter (Signed)
I do not see a note re: podiatry referral.  Will defer to Dr Jerline Pain, when he returns next week.

## 2016-02-05 NOTE — Telephone Encounter (Signed)
Pt called and would like to have the referral to the foot doctor that she discuss with Dr. Jerline Pain. Please call when referral is done. jw

## 2016-02-12 NOTE — Telephone Encounter (Signed)
Referral placed.  Michelle [name]. Michelle [name], [place] Resident PGY-3 02/12/2016 8:57 AM

## 2016-02-20 DIAGNOSIS — M0579 Rheumatoid arthritis with rheumatoid factor of multiple sites without organ or systems involvement: Secondary | ICD-10-CM | POA: Diagnosis not present

## 2016-02-28 ENCOUNTER — Ambulatory Visit (INDEPENDENT_AMBULATORY_CARE_PROVIDER_SITE_OTHER): Payer: Medicare Other | Admitting: Podiatry

## 2016-02-28 ENCOUNTER — Encounter: Payer: Self-pay | Admitting: Podiatry

## 2016-02-28 DIAGNOSIS — B351 Tinea unguium: Secondary | ICD-10-CM

## 2016-02-28 DIAGNOSIS — M79674 Pain in right toe(s): Secondary | ICD-10-CM

## 2016-02-28 DIAGNOSIS — M79675 Pain in left toe(s): Secondary | ICD-10-CM

## 2016-02-28 NOTE — Patient Instructions (Signed)
Diabetes and Foot Care Diabetes may cause you to have problems because of poor blood supply (circulation) to your feet and legs. This may cause the skin on your feet to become thinner, break easier, and heal more slowly. Your skin may become dry, and the skin may peel and crack. You may also have nerve damage in your legs and feet causing decreased feeling in them. You may not notice minor injuries to your feet that could lead to infections or more serious problems. Taking care of your feet is one of the most important things you can do for yourself.  HOME CARE INSTRUCTIONS  Wear shoes at all times, even in the house. Do not go barefoot. Bare feet are easily injured.  Check your feet daily for blisters, cuts, and redness. If you cannot see the bottom of your feet, use a mirror or ask someone for help.  Wash your feet with warm water (do not use hot water) and mild soap. Then pat your feet and the areas between your toes until they are completely dry. Do not soak your feet as this can dry your skin.  Apply a moisturizing lotion or petroleum jelly (that does not contain alcohol and is unscented) to the skin on your feet and to dry, brittle toenails. Do not apply lotion between your toes.  Trim your toenails straight across. Do not dig under them or around the cuticle. File the edges of your nails with an emery board or nail file.  Do not cut corns or calluses or try to remove them with medicine.  Wear clean socks or stockings every day. Make sure they are not too tight. Do not wear knee-high stockings since they may decrease blood flow to your legs.  Wear shoes that fit properly and have enough cushioning. To break in new shoes, wear them for just a few hours a day. This prevents you from injuring your feet. Always look in your shoes before you put them on to be sure there are no objects inside.  Do not cross your legs. This may decrease the blood flow to your feet.  If you find a minor scrape,  cut, or break in the skin on your feet, keep it and the skin around it clean and dry. These areas may be cleansed with mild soap and water. Do not cleanse the area with peroxide, alcohol, or iodine.  When you remove an adhesive bandage, be sure not to damage the skin around it.  If you have a wound, look at it several times a day to make sure it is healing.  Do not use heating pads or hot water bottles. They may burn your skin. If you have lost feeling in your feet or legs, you may not know it is happening until it is too late.  Make sure your health care provider performs a complete foot exam at least annually or more often if you have foot problems. Report any cuts, sores, or bruises to your health care provider immediately. SEEK MEDICAL CARE IF:   You have an injury that is not healing.  You have cuts or breaks in the skin.  You have an ingrown nail.  You notice redness on your legs or feet.  You feel burning or tingling in your legs or feet.  You have pain or cramps in your legs and feet.  Your legs or feet are numb.  Your feet always feel cold. SEEK IMMEDIATE MEDICAL CARE IF:   There is increasing redness,   swelling, or pain in or around a wound.  There is a red line that goes up your leg.  Pus is coming from a wound.  You develop a fever or as directed by your health care provider.  You notice a bad smell coming from an ulcer or wound.   This information is not intended to replace advice given to you by your health care provider. Make sure you discuss any questions you have with your health care provider.   Document Released: 06/14/2000 Document Revised: 02/17/2013 Document Reviewed: 11/24/2012 Elsevier Interactive Patient Education 2016 [employer]  

## 2016-02-28 NOTE — Progress Notes (Signed)
   Subjective:    Patient ID: Michelle [name], female    DOB: Oct 17, 1950, 65 y.o.   MRN: [id number]  HPI    This patient presents today complaining of occasional cramping in the right foot over the last 10 years on and off weightbearing without a significant increase of symptoms. She also has concern about her thickened toenails trimmed cough walking wearing shoes and requests toenail debridement  Patient is diabetic and denies any history of claudication, amputation or foot ulceration Patient is a rheumatoid arthritic  The patient's granddaughters present in the treatment room today  Review of Systems  HENT: Positive for hearing loss and sinus pressure.   Gastrointestinal: Positive for constipation and diarrhea.  Endocrine:       Excessive thirst and increase urination  Genitourinary: Positive for frequency.  Musculoskeletal: Positive for back pain and gait problem.  Hematological: Bruises/bleeds easily.       Objective:   Physical Exam  Orientated 3  Vascular: DP and PT pulses 2/4 bilaterally Capillary reflex immediate bilaterally  Neurological: Sensation to 10 g monofilament wire intact 5/5 bilaterally Vibratory sensation reactive bilaterally Ankle reflex equal and reactive bilaterally  Dermatological: No open skin lesions bilaterally Toenails 1-5 bilaterally are elongated, brittle, deformed, discolored and tender direct palpation 6-10  Musculoskeletal: Varus rotated fifth toes bilaterally Bunionette's bilaterally There is no restriction ankle, subtalar, midtarsal joints bilaterally Palpation plantar MPJ demonstrates mildly atrophic fat-pad without any palpable lesions      Assessment & Plan:   Assessment: Diabetic with satisfactory neurovascular status Neglected symptomatic onychomycoses 6-10 MPJ cramping possibly associated with rheumatoid arthritis without any obvious deformity noted in the MPJ bilaterally  Plan: Reviewed the results exam with patient  today Debridement toenails 6-10 mechanically and electronically without any bleeding  Reappoint as needed or yearly

## 2016-03-11 DIAGNOSIS — L570 Actinic keratosis: Secondary | ICD-10-CM | POA: Diagnosis not present

## 2016-03-11 DIAGNOSIS — D485 Neoplasm of uncertain behavior of skin: Secondary | ICD-10-CM | POA: Diagnosis not present

## 2016-03-11 DIAGNOSIS — L821 Other seborrheic keratosis: Secondary | ICD-10-CM | POA: Diagnosis not present

## 2016-03-11 DIAGNOSIS — Z85828 Personal history of other malignant neoplasm of skin: Secondary | ICD-10-CM | POA: Diagnosis not present

## 2016-03-11 DIAGNOSIS — L578 Other skin changes due to chronic exposure to nonionizing radiation: Secondary | ICD-10-CM | POA: Diagnosis not present

## 2016-04-02 ENCOUNTER — Ambulatory Visit (INDEPENDENT_AMBULATORY_CARE_PROVIDER_SITE_OTHER): Payer: Medicare Other | Admitting: Family Medicine

## 2016-04-02 ENCOUNTER — Encounter: Payer: Self-pay | Admitting: Family Medicine

## 2016-04-02 VITALS — BP 140/77 | HR 84 | Temp 98.2°F | Wt 163.0 lb

## 2016-04-02 DIAGNOSIS — E785 Hyperlipidemia, unspecified: Secondary | ICD-10-CM | POA: Diagnosis not present

## 2016-04-02 DIAGNOSIS — G8929 Other chronic pain: Secondary | ICD-10-CM | POA: Diagnosis not present

## 2016-04-02 DIAGNOSIS — G252 Other specified forms of tremor: Secondary | ICD-10-CM | POA: Diagnosis not present

## 2016-04-02 DIAGNOSIS — Z23 Encounter for immunization: Secondary | ICD-10-CM | POA: Diagnosis present

## 2016-04-02 DIAGNOSIS — E119 Type 2 diabetes mellitus without complications: Secondary | ICD-10-CM

## 2016-04-02 DIAGNOSIS — I10 Essential (primary) hypertension: Secondary | ICD-10-CM

## 2016-04-02 DIAGNOSIS — M25559 Pain in unspecified hip: Secondary | ICD-10-CM | POA: Diagnosis not present

## 2016-04-02 LAB — POCT GLYCOSYLATED HEMOGLOBIN (HGB A1C): HEMOGLOBIN A1C: 8.2

## 2016-04-02 MED ORDER — OXYCODONE-ACETAMINOPHEN 10-325 MG PO TABS: 1.0000 | ORAL_TABLET | Freq: Four times a day (QID) | ORAL | 0 refills | Status: DC | PRN

## 2016-04-02 NOTE — Assessment & Plan Note (Signed)
At goal.  Continue losartan. 

## 2016-04-02 NOTE — Assessment & Plan Note (Signed)
Most consistent with trochanteric bursitis. Patient deferred injections and physical therapy today. Will be visiting her rheumatologist tomorrow and call us back if she decides to have either of this interventions.

## 2016-04-02 NOTE — Assessment & Plan Note (Signed)
Patient with intention tremor of left hand noted. No tremor at rest. No other neurological findings today. Unclear etiology but given unilateral findings and possibility for concerning underlying pathology (MS, degenerative disease, etc) will refer to neurology for further evaluation.

## 2016-04-02 NOTE — Progress Notes (Signed)
Subjective:  Michelle [name] is a 65 y.o. female who presents to the [name] today with a chief complaint of chronic pain follow up.   HPI:  Chronic Pain A little worse since her last visit. Has chronic pain in her hands, feet, back, hips secondary to RA. Also reports a history of OA. Has been managed by her rheumatologist. Recently has made several medication changes including a 6 week course of prednisone that she has now completed. Is now on Enbrel. Currently on percocet which helps with the pain. No weakness or numbness. No bowel or bladder incontinence. No saddle anesthesia.   Hip Pain Patient also with worsening hip pain for the past several weeks to months. Pain is worse in her left hip. Worse with movement. Improved with rest. Pain radiates down her legs. No history of trauma or other precipitating events.   Tremors Patient also with a several week history of "shaking" in her left hand. Patient reports that whenever she tries to do anything involving her left hand, she starts shaking. For example, it is difficult for her to carry a plate of food in her left hand as she is afraid that the food may fall off.   T2DM Currently on januvia and metformin. Tolerating both without side effects. No polyuria or polydipsia.   Hypertension BP Readings from Last 3 Encounters:  04/02/16 140/77  01/03/16 129/66  11/15/15 106/70   Home BP monitoring-yes Compliant with medications-yes. without side effects ROS-Denies any CP, HA, SOB, blurry vision, LE edema, transient weakness, orthopnea, PND.   HLD Tolerating crestor without side effects.   ROS: Per HPI  PMH: Smoking history reviewed.     Objective:  Physical Exam: BP 140/77   Pulse 84   Temp 98.2 F (36.8 C) (Oral)   Wt 163 lb (73.9 kg)   SpO2 93%   BMI 27.98 kg/m   Gen: NAD, resting comfortably CV: RRR with no murmurs appreciated Pulm: NWOB, CTAB with no crackles, wheezes, or rhonchi GI: Normal bowel sounds present. Soft,  Nontender, Nondistended. MSK:  - Left Hip: No deformities. Greater trochanter tender to palpation. Hip adductors 4/5, otherwise 5/5 strength throughout. - Right hip: No deformities. Greater trochanter tender to palpation. Hip adductors 4+/5, otherwise 5/5 strength throughout.  Skin: warm, dry Neuro: Left intention tremor noted on FNF. Otherwise CN2-12 intact. Moves all extremities spontaneously.  Psych: Normal affect and thought content  Results for orders placed or performed in visit on 04/02/16 (from the past 72 hour(s))  HgB A1c     Status: Abnormal   Collection Time: 04/02/16  1:43 PM  Result Value Ref Range   Hemoglobin A1C 8.2      Assessment/Plan:  Encounter for chronic pain management Current regimen improves patient's pain and function.  Refilled percocet today. No red flag signs or symptoms. Can consider addition of other agent such as lyrica or SNRI/TCA in the future if pain not controlled.   Hip pain Most consistent with trochanteric bursitis. Patient deferred injections and physical therapy today. Will be visiting her rheumatologist tomorrow and call us back if she decides to have either of this interventions.   Intention tremor Patient with intention tremor of left hand noted. No tremor at rest. No other neurological findings today. Unclear etiology but given unilateral findings and possibility for concerning underlying pathology (MS, degenerative disease, etc) will refer to neurology for further evaluation.   Diabetes mellitus without complication (HCC) 123456 slightly elevated today, though patient recently completed 6 week  course of prednisone for RA. Deferred increasing medications today. Will recheck in 2-3 months. If still above goal, will need increased therapy.   Essential hypertension At goal. Continue losartan.   Hyperlipidemia LDL 51 nine months ago. Continue crestor.    Algis Greenhouse. Jerline Pain, [facility] Resident PGY-3 04/02/2016 2:48 PM

## 2016-04-02 NOTE — Assessment & Plan Note (Signed)
LDL 51 nine months ago. Continue crestor.

## 2016-04-02 NOTE — Assessment & Plan Note (Signed)
A1c slightly elevated today, though patient recently completed 6 week course of prednisone for RA. Deferred increasing medications today. Will recheck in 2-3 months. If still above goal, will need increased therapy.

## 2016-04-02 NOTE — Patient Instructions (Signed)
Your A1c was 8.2 today. This is a little higher than normal. We will recheck it again in a few months now that you are off the prednisone.  Your blood pressure looks good today.  Let me know if you want a referral to sports medicine for your hip.  We will refer you to a neurologist for your tremor.  Come back to see me in 3 months.  Take care,  Dr Jerline Pain

## 2016-04-02 NOTE — Assessment & Plan Note (Signed)
Current regimen improves patient's pain and function.  Refilled percocet today. No red flag signs or symptoms. Can consider addition of other agent such as lyrica or SNRI/TCA in the future if pain not controlled.

## 2016-04-03 DIAGNOSIS — M79605 Pain in left leg: Secondary | ICD-10-CM | POA: Diagnosis not present

## 2016-04-03 DIAGNOSIS — Z7952 Long term (current) use of systemic steroids: Secondary | ICD-10-CM | POA: Diagnosis not present

## 2016-04-03 DIAGNOSIS — M5441 Lumbago with sciatica, right side: Secondary | ICD-10-CM | POA: Diagnosis not present

## 2016-04-03 DIAGNOSIS — M25641 Stiffness of right hand, not elsewhere classified: Secondary | ICD-10-CM | POA: Diagnosis not present

## 2016-04-03 DIAGNOSIS — M7062 Trochanteric bursitis, left hip: Secondary | ICD-10-CM | POA: Diagnosis not present

## 2016-04-03 DIAGNOSIS — Z7984 Long term (current) use of oral hypoglycemic drugs: Secondary | ICD-10-CM | POA: Diagnosis not present

## 2016-04-03 DIAGNOSIS — M25552 Pain in left hip: Secondary | ICD-10-CM | POA: Diagnosis not present

## 2016-04-03 DIAGNOSIS — Z7951 Long term (current) use of inhaled steroids: Secondary | ICD-10-CM | POA: Diagnosis not present

## 2016-04-03 DIAGNOSIS — Z79891 Long term (current) use of opiate analgesic: Secondary | ICD-10-CM | POA: Diagnosis not present

## 2016-04-03 DIAGNOSIS — Z7982 Long term (current) use of aspirin: Secondary | ICD-10-CM | POA: Diagnosis not present

## 2016-04-03 DIAGNOSIS — M1990 Unspecified osteoarthritis, unspecified site: Secondary | ICD-10-CM | POA: Diagnosis not present

## 2016-04-03 DIAGNOSIS — Z79899 Other long term (current) drug therapy: Secondary | ICD-10-CM | POA: Diagnosis not present

## 2016-04-03 DIAGNOSIS — M8580 Other specified disorders of bone density and structure, unspecified site: Secondary | ICD-10-CM | POA: Diagnosis not present

## 2016-04-03 DIAGNOSIS — M542 Cervicalgia: Secondary | ICD-10-CM | POA: Diagnosis not present

## 2016-04-03 DIAGNOSIS — M059 Rheumatoid arthritis with rheumatoid factor, unspecified: Secondary | ICD-10-CM | POA: Diagnosis not present

## 2016-04-03 DIAGNOSIS — Z5181 Encounter for therapeutic drug level monitoring: Secondary | ICD-10-CM | POA: Diagnosis not present

## 2016-04-03 DIAGNOSIS — M0579 Rheumatoid arthritis with rheumatoid factor of multiple sites without organ or systems involvement: Secondary | ICD-10-CM | POA: Diagnosis not present

## 2016-04-03 DIAGNOSIS — M25572 Pain in left ankle and joints of left foot: Secondary | ICD-10-CM | POA: Diagnosis not present

## 2016-04-03 DIAGNOSIS — J45991 Cough variant asthma: Secondary | ICD-10-CM | POA: Diagnosis not present

## 2016-04-10 ENCOUNTER — Other Ambulatory Visit: Payer: Self-pay | Admitting: Family Medicine

## 2016-04-10 DIAGNOSIS — E119 Type 2 diabetes mellitus without complications: Secondary | ICD-10-CM

## 2016-04-10 MED ORDER — GLUCOSE BLOOD VI STRP: ORAL_STRIP | 3 refills | Status: DC

## 2016-04-12 DIAGNOSIS — H02423 Myogenic ptosis of bilateral eyelids: Secondary | ICD-10-CM | POA: Diagnosis not present

## 2016-04-19 ENCOUNTER — Telehealth: Payer: Self-pay | Admitting: Family Medicine

## 2016-04-19 DIAGNOSIS — M25559 Pain in unspecified hip: Secondary | ICD-10-CM

## 2016-04-19 NOTE — Telephone Encounter (Signed)
Will forward to MD to advise. Jazmin Hartsell,CMA  

## 2016-04-19 NOTE — Telephone Encounter (Signed)
Referral to sports medicine placed.  Algis Greenhouse. Jerline Pain, [facility] Resident PGY-3 04/19/2016 2:39 PM

## 2016-04-19 NOTE — Telephone Encounter (Signed)
Pt got shot in right hip on 04-03-16.  She is having trouble with both hips. She is having trouble walking, she would like to have the ultrasound set up so they can find out what is wrong

## 2016-05-01 ENCOUNTER — Encounter: Payer: Self-pay | Admitting: Family Medicine

## 2016-05-01 ENCOUNTER — Ambulatory Visit (INDEPENDENT_AMBULATORY_CARE_PROVIDER_SITE_OTHER): Payer: Medicare Other | Admitting: Family Medicine

## 2016-05-01 DIAGNOSIS — M25552 Pain in left hip: Secondary | ICD-10-CM

## 2016-05-01 DIAGNOSIS — M25551 Pain in right hip: Secondary | ICD-10-CM

## 2016-05-01 MED ORDER — DICLOFENAC SODIUM 1 % TD GEL: 2.0000 g | Freq: Four times a day (QID) | TRANSDERMAL | 0 refills | Status: DC

## 2016-05-01 NOTE — Progress Notes (Signed)
IVERSON [name] - 65 y.o. female MRN [id number]  Date of birth: 1951/04/29  SUBJECTIVE:  Including CC & ROS.  Chief Complaint  Patient presents with  . Hip Pain    Ms. Michelle [name] is a 65 year old female that is presenting with bilateral hip pain. The left hip is worse than the right. The pain is occurring on the lateral aspect of each hip. She received an injection on October 4 has had some improvement. She has trouble with sitting or walking. She is unable to squat. This pain has been ongoing for 6 months and started a gradual nature. She denies any injury. She's been taking over-the-counter medications. She is also been on prednisone for her rheumatoid arthritis which has not helped. She denies any fatigue. She denies any similar complaints with in her shoulders. She has pain with sleeping on the affected side. She has not undergone any physical therapy. She denies any prior fractures in her long bones. She is currently taking vitamin D and calcium. She feels like the pain has gotten worse with its intensity.   ROS: No unexpected weight loss, fever, chills, swelling, instability, numbness/tingling, redness, otherwise see HPI    HISTORY: Past Medical, Surgical, Social, and Family History Reviewed & Updated per EMR.   Pertinent Historical Findings include: PMSHx -  RA, DM2, osteoporosis, HLD, Obesity, HTN, skin cancer removal. PSHx -  no tobacco or alcohol use FHx -  diabetes, hypertension, depression Medications - cimizia   DATA REVIEWED: 11/18/12: right hip x-ray: no fracture. Preserved joint space b/l   PHYSICAL EXAM:  VS: BP:(!) 141/86  HR: bpm  TEMP: ( )  RESP:   HT:5\' 3"  (160 cm)   WT:160 lb (72.6 kg)  BMI:28.4 PHYSICAL EXAM: Gen: NAD, alert, cooperative with exam,  HEENT: clear conjunctiva, EOMI CV:  no edema, capillary refill brisk,  Resp: non-labored, normal speech Skin: no rashes, normal turgor  Neuro: no gross deficits.  Psych:  alert and oriented Hip:  No tenderness to  palpation over the SI joints bilaterally. No tenderness to palpation over the piriformis bilaterally. No tenderness palpation of the is she'll tuberosity bilaterally. Significant tenderness to the left greater trochanter. Somewhat tender to palpation of the right greater trochanter. Normal hip flexion bilaterally. Normal internal and external rotation bilaterally. Normal knee flexion and extension bilaterally. Normal plantar and dorsal flexion. Significant weakness with hip abduction bilaterally. No significant pain with FADIR or FABER  No tenderness palpation of the lumbar spine  Foot: Slight pes planus bilaterally. Gait Walks with supination of each foot bilaterally and wears out the lateral aspect of each shoe.  Limited ultrasound: Left hip: The greater trochanter as viewed and did not demonstrate any bursitis. The gluteus medius was viewed in long axis and to its attachment and appeared to be normal. The piriformis was viewed in long axis and to its attachment and appeared to be normal. The IT band was viewed and short and long axis from the greater trochanter to the lateral knee and appeared to be normal.  ASSESSMENT & PLAN:   Hip pain Pain could be associated with bilateral greater trochanteric bursitis versus gluteus medius syndrome. She has already received an injection earlier this month with some improvement. She has also been on prednisone for her rheumatoid arthritis which did not help. Likely she would gain the most benefit from hip strengthening exercises. She also walks with a some supination which could consider Greens sport insoles to help with this and her hip pain as well. -  Voltaren gel - Provided home exercises - She'll follow-up in 2-4 weeks and can consider greens sport insoles at that time.

## 2016-05-02 NOTE — Assessment & Plan Note (Signed)
Pain could be associated with bilateral greater trochanteric bursitis versus gluteus medius syndrome. She has already received an injection earlier this month with some improvement. She has also been on prednisone for her rheumatoid arthritis which did not help. Likely she would gain the most benefit from hip strengthening exercises. She also walks with a some supination which could consider Greens sport insoles to help with this and her hip pain as well. - Voltaren gel - Provided home exercises - She'll follow-up in 2-4 weeks and can consider greens sport insoles at that time.

## 2016-05-09 ENCOUNTER — Ambulatory Visit: Payer: Medicare Other | Admitting: Neurology

## 2016-05-09 DIAGNOSIS — H53453 Other localized visual field defect, bilateral: Secondary | ICD-10-CM | POA: Diagnosis not present

## 2016-05-09 DIAGNOSIS — H02423 Myogenic ptosis of bilateral eyelids: Secondary | ICD-10-CM | POA: Diagnosis not present

## 2016-05-20 NOTE — Progress Notes (Signed)
Subjective:   Michelle [name] was seen in consultation in the movement disorder clinic at the request of Dimas Chyle, MD.  The evaluation is for tremor.  She is accompanied by her granddaughter who supplements the history.  Pt is a 65 y.o. female with hx of chronic pain, RA, anxiety, DM who presents with c/o tremor.  Tremor started approximately a year ago and involves the L hand.  She notes it when carrying something like a plate.  Pt was on prednisone for RA recently but tremor is independent of the prednisone.     Specific Symptoms:  Tremor: Yes.   Family hx of similar:  Yes.  , both mother and sister had tremor but no known dx Voice: "everyone says I talk too loud" Sleep: multiple awakenings to use the BR; she is sleepy during the day  Vivid Dreams:  No.  Acting out dreams:  rarely Wet Pillows: No. Postural symptoms:  Yes.    Falls?  No. but had near falls Bradykinesia symptoms: slow movements, drooling while awake and difficulty getting out of a chair Loss of smell:  No., better after septoplasty Loss of taste:  No. Urinary Incontinence:  Yes.  , urinary urgency and frequency Difficulty Swallowing:  No. Handwriting, micrographia: No. Trouble with ADL's:  No.  Trouble buttoning clothing: Yes.  , but because of RA Depression:  Yes.   Memory changes:  Yes.   Hallucinations:  No.  visual distortions: Yes.   N/V:  Yes.   (emesis with ice cream but attributes to GERD) Lightheaded:  Yes.    Syncope: No. Diplopia:  No. Dyskinesia:  No.  Neuroimaging has previously been performed.  It is available for my review today.  MRI done in 01/2013 Demonstrated scattered T2 hyperintensities, overall mild, and a developmental venous anomaly in the left frontal operculum.   ALLERGIES:   Allergies  Allergen Reactions  . Ace Inhibitors Cough    Occurred with liinopril  . Sulfonamide Derivatives Hives and Itching    CURRENT MEDICATIONS:  Outpatient Encounter Prescriptions as of  05/21/2016  Medication Sig  . albuterol (VENTOLIN HFA) 108 (90 Base) MCG/ACT inhaler INHALE TWO PUFFS BY MOUTH EVERY 4 HOURS AS NEEDED FOR WHEEZING FOR SHORTNESS OF BREATH  . aspirin EC 81 MG tablet Take 81 mg by mouth daily.  . beclomethasone (QVAR) 40 MCG/ACT inhaler Inhale 2 puffs into the lungs 2 (two) times daily.  . cetirizine (ZYRTEC) 10 MG tablet Take 1 tablet (10 mg total) by mouth daily.  . clonazePAM (KLONOPIN) 1 MG tablet Take 1 tablet (1 mg total) by mouth 2 (two) times daily as needed for anxiety. 60=30 day supply. No early refills.  . diclofenac sodium (VOLTAREN) 1 % GEL Apply 2 g topically 4 (four) times daily as needed (for pain).  . fluticasone (FLONASE) 50 MCG/ACT nasal spray Place 2 sprays into both nostrils daily.  Marland Kitchen leflunomide (ARAVA) 10 MG tablet Take 10 mg by mouth daily.  Marland Kitchen losartan (COZAAR) 50 MG tablet Take 0.5 tablets (25 mg total) by mouth daily.  . metFORMIN (GLUCOPHAGE) 1000 MG tablet Take 1 tablet (1,000 mg total) by mouth 2 (two) times daily with a meal.  . omeprazole (PRILOSEC) 40 MG capsule Take 1 capsule (40 mg total) by mouth 2 (two) times daily.  Marland Kitchen oxyCODONE-acetaminophen (PERCOCET) 10-325 MG tablet Take 1 tablet by mouth every 6 (six) hours as needed for pain.  . rosuvastatin (CRESTOR) 10 MG tablet Take 1 tablet (10 mg total) by mouth  daily.  . sertraline (ZOLOFT) 50 MG tablet Take 1 tablet (50 mg total) by mouth daily.  . sitaGLIPtin (JANUVIA) 100 MG tablet Take 1 tablet (100 mg total) by mouth daily.  Marland Kitchen triamcinolone (KENALOG) 0.1 % paste USE AS DIRECTED IN  MOUTH/THROAT  TO  THE  LESION  TWICE  DAILY  . nitroGLYCERIN (NITROSTAT) 0.4 MG SL tablet Place 0.4 mg under the tongue every 5 (five) minutes as needed.    . [DISCONTINUED] adalimumab (HUMIRA) 40 MG/0.8ML injection Inject 40 mg into the skin every 14 (fourteen) days.  . [DISCONTINUED] diclofenac sodium (VOLTAREN) 1 % GEL Apply 2 g topically 4 (four) times daily.  . [DISCONTINUED] glucose blood  (ACCU-CHEK AVIVA) test strip Use daily as needed to check your blood sugar.  . [DISCONTINUED] Olopatadine HCl 0.2 % SOLN Apply 1 drop to eye daily.  . [DISCONTINUED] oxyCODONE-acetaminophen (PERCOCET) 10-325 MG tablet Take 1 tablet by mouth every 6 (six) hours as needed for pain.  . [DISCONTINUED] oxyCODONE-acetaminophen (PERCOCET) 10-325 MG tablet Take 1 tablet by mouth every 6 (six) hours as needed for pain.  . [DISCONTINUED] triamcinolone cream (KENALOG) 0.1 % APPLY CREAM EXTERNALLY TO AFFECTED AREA AS NEEDED   No facility-administered encounter medications on file as of 05/21/2016.     PAST MEDICAL HISTORY:   Past Medical History:  Diagnosis Date  . Allergy   . Anxiety   . Asthma   . Cataract    beginning on left eye  . Diabetes mellitus   . Diverticulosis   . GERD (gastroesophageal reflux disease)   . HLD (hyperlipidemia)   . Lung nodules   . Osteoporosis   . RA (rheumatoid arthritis) ([place])    CCP positive followed by [facility]    PAST SURGICAL HISTORY:   Past Surgical History:  Procedure Laterality Date  . ABDOMINAL HYSTERECTOMY    . APPENDECTOMY    . CARPAL TUNNEL RELEASE    . CHOLECYSTECTOMY    . HERNIA REPAIR    . KNEE SURGERY     x2  . LEFT AND RIGHT HEART CATHETERIZATION WITH CORONARY ANGIOGRAM N/A 08/17/2013   Procedure: LEFT AND RIGHT HEART CATHETERIZATION WITH CORONARY ANGIOGRAM;  Surgeon: Laverda Page, MD;  Location: [facility] CATH LAB;  Service: Cardiovascular;  Laterality: N/A;  . right foot bone spurs removed    . SHOULDER SURGERY     x2    SOCIAL HISTORY:   Social History   Social History  . Marital status: Married    Spouse name: N/A  . Number of children: 3  . Years of education: N/A   Occupational History  . disabled   .  Unemployed   Social History Main Topics  . Smoking status: Former Smoker    Packs/day: 1.50    Years: 5.00    Types: Cigarettes    Quit date: 05/31/1994  . Smokeless tobacco: Never Used  . Alcohol use No  . Drug use: No    . Sexual activity: Not on file   Other Topics Concern  . Not on file   Social History Narrative   Pt is on [employer] program [place]   Lots of family stressors including grandaughter who lives with her          FAMILY HISTORY:   Family Status  Relation Status  . Father Deceased  . Maternal Aunt Deceased  . Mother Deceased  . Brother   . Sister   . Daughter   . Sister   . Sister   .  Neg Hx     ROS:  Chronic SOB.  A complete 10 system review of systems was obtained and was unremarkable apart from what is mentioned above.  PHYSICAL EXAMINATION:    VITALS:   Vitals:   05/21/16 0943  BP: 136/80  Pulse: 80  Weight: 164 lb (74.4 kg)  Height: 5' 3.5" (1.613 m)    GEN:  The patient appears stated age and is in NAD. HEENT:  Normocephalic, atraumatic.  The mucous membranes are moist. The superficial temporal arteries are without ropiness or tenderness. CV:  RRR Lungs:  CTAB Neck/HEME:  There are no carotid bruits bilaterally.  Neurological examination:  Orientation: The patient is alert and oriented x3. Fund of knowledge is appropriate.  Recent and remote memory are intact.  Attention and concentration are normal.    Able to name objects and repeat phrases. Cranial nerves: There is good facial symmetry. Pupils are equal round and reactive to light bilaterally. Fundoscopic exam reveals clear margins bilaterally. Extraocular muscles are intact. The visual fields are full to confrontational testing. The speech is fluent and clear. Soft palate rises symmetrically and there is no tongue deviation. Hearing is intact to conversational tone. Sensation: Sensation is intact to light and pinprick throughout (facial, trunk, extremities). Vibration is intact at the bilateral big toe. There is no extinction with double simultaneous stimulation. There is no sensory dermatomal level identified. Motor: Strength is 5/5 in the bilateral upper and lower extremities.   Shoulder shrug is equal and  symmetric.  There is no pronator drift. Deep tendon reflexes: Deep tendon reflexes are 2/4 at the bilateral biceps, triceps, brachioradialis, patella and achilles. Plantar responses are downgoing bilaterally.  Movement examination: Tone: There is normal tone in the bilateral upper extremities.  The tone in the lower extremities is normal.  Abnormal movements: There is no rest tremor.  There is tremor of the outstretched hands increases with intention on the left.  She has mild difficulty with Archimedes spirals on the left.  She spills some water when asked to pour a full glass of water from one glass to another, and tremor is evident when the focus is in the left hand. Coordination:  There is no decremation with RAM's, with any form of RAMS, including alternating supination and pronation of the forearm, hand opening and closing, finger taps, heel taps and toe taps. Gait and Station: The patient has no difficulty arising out of a deep-seated chair without the use of the hands. The patient's stride length is normal, with normal arm swing.     LABS  Lab Results  Component Value Date   TSH 1.835 03/02/2014   No results found for: VITAMINB12  She had lab work at [facility] on 04/03/2016.  I reviewed those.  Her AST was 23 and ALT 44.  Her CBC was normal.    Chemistry      Component Value Date/Time   NA 137 07/19/2015 0913   K 4.3 07/19/2015 0913   CL 103 07/19/2015 0913   CO2 26 07/19/2015 0913   BUN 12 07/19/2015 0913   CREATININE 0.49 (L) 07/19/2015 0913      Component Value Date/Time   CALCIUM 9.1 07/19/2015 0913   ALKPHOS 69 10/29/2013 0910   AST 13 10/29/2013 0910   ALT 19 10/29/2013 0910   BILITOT 0.4 10/29/2013 0910       ASSESSMENT/PLAN:  1.   Essential Tremor.  -I told the patient that this likely represents asymmetric essential tremor.  While not  common, this is also not unusual either and up to 30% of patients can have this.  I see no evidence of Parkinson's disease.  I am  going to do lab work including TSH, B12, copper, serum ceruloplasmin.    -We talked about various treatments, including primidone and beta blockers (would be somewhat leery about these given history of asthma), but ultimately she decided to hold off on this.  She really does not want at further medication.  -We will call her with the results of the above lab work.  I will plan on seeing her back in about a year.  If she needs me before that time, I would be happy to see her back.  If new neurologic issues arise, I want her to make an appointment before her scheduled follow-up.  Greater than 50% of the visit was spent in counseling with the patient and her granddaughter.  Total visit time was 45 minutes.     Cc:  Dimas Chyle, MD

## 2016-05-21 ENCOUNTER — Encounter: Payer: Self-pay | Admitting: Neurology

## 2016-05-21 ENCOUNTER — Other Ambulatory Visit: Payer: Self-pay | Admitting: Neurology

## 2016-05-21 ENCOUNTER — Other Ambulatory Visit: Payer: Medicare Other

## 2016-05-21 ENCOUNTER — Ambulatory Visit (INDEPENDENT_AMBULATORY_CARE_PROVIDER_SITE_OTHER): Payer: Medicare Other | Admitting: Neurology

## 2016-05-21 VITALS — BP 136/80 | HR 80 | Ht 63.5 in | Wt 164.0 lb

## 2016-05-21 DIAGNOSIS — R5382 Chronic fatigue, unspecified: Secondary | ICD-10-CM

## 2016-05-21 DIAGNOSIS — R6889 Other general symptoms and signs: Secondary | ICD-10-CM | POA: Diagnosis not present

## 2016-05-21 DIAGNOSIS — R251 Tremor, unspecified: Secondary | ICD-10-CM

## 2016-05-21 LAB — VITAMIN B12: VITAMIN B 12: 399 pg/mL (ref 200–1100)

## 2016-05-21 LAB — TSH: TSH: 2.21 mIU/L

## 2016-05-21 NOTE — Patient Instructions (Signed)
1. Your provider has requested that you have labwork completed today. Please go to [facility] ([address]) on the second floor of this building before leaving the office today. You do not need to check in. If you are not called within 15 minutes please check with the front desk.   

## 2016-05-23 LAB — COPPER, URINE - RANDOM OR 24 HOUR
COPPER UR: 3 ug/L
Copper / Creatinine Ratio: 19 ug/g creat (ref 0–49)
Creatinine(Crt),U: 0.16 g/L — ABNORMAL LOW (ref 0.30–3.00)

## 2016-05-24 LAB — CERULOPLASMIN: CERULOPLASMIN: 19 mg/dL (ref 18–53)

## 2016-05-28 ENCOUNTER — Telehealth: Payer: Self-pay | Admitting: Neurology

## 2016-05-28 NOTE — Telephone Encounter (Signed)
-----   Message from [place], DO sent at 05/25/2016 12:31 PM EST ----- Let pt know that labs look okay

## 2016-05-28 NOTE — Telephone Encounter (Signed)
Mychart message sent to patient.

## 2016-05-28 NOTE — Telephone Encounter (Signed)
Patient made aware lab work okay.  

## 2016-05-28 NOTE — Telephone Encounter (Signed)
Patient would like the test results back please call her at [phone] or [phone]

## 2016-05-29 ENCOUNTER — Ambulatory Visit: Payer: Medicare Other | Admitting: Sports Medicine

## 2016-05-30 DIAGNOSIS — M059 Rheumatoid arthritis with rheumatoid factor, unspecified: Secondary | ICD-10-CM | POA: Diagnosis not present

## 2016-05-30 DIAGNOSIS — Z79899 Other long term (current) drug therapy: Secondary | ICD-10-CM | POA: Diagnosis not present

## 2016-06-13 DIAGNOSIS — Z79899 Other long term (current) drug therapy: Secondary | ICD-10-CM | POA: Diagnosis not present

## 2016-06-13 DIAGNOSIS — M059 Rheumatoid arthritis with rheumatoid factor, unspecified: Secondary | ICD-10-CM | POA: Diagnosis not present

## 2016-06-18 NOTE — Progress Notes (Deleted)
   [facility] Phone: [phone]   Date of Visit: 06/19/2016   HPI:  ***  ROS: See HPI.  [place]:  PMH: HTN Asthma and Restrictive Lung Disease Allergic Rhinitis GERD IBS DM2 RA   PHYSICAL EXAM: There were no vitals taken for this visit. Gen: *** HEENT: *** Heart: *** Lungs: *** Neuro: *** Ext: ***  ASSESSMENT/PLAN:  Health maintenance:  -***  No problem-specific Assessment & Plan notes found for this encounter.  FOLLOW UP: Follow up in *** for ***  Smiley Houseman, MD PGY [place]

## 2016-06-19 ENCOUNTER — Ambulatory Visit: Payer: Medicare Other | Admitting: Internal Medicine

## 2016-06-20 NOTE — Progress Notes (Signed)
   [facility] Phone: [phone]   Date of Visit: 06/21/2016   HPI:  Michelle [name] is a 65 y.o. female presenting to clinic today for same day appointment. PCP: Dimas Chyle, MD Concerns today include:  Sinus Infection:  - patient reports of frontal HA and nasal pain x 1 month; improved initially but worsened a few weeks ago.  - had a cough but that has improved, now just seems to have chest congestion. No shortness of breath but intermittent wheezing which is at baseline. Whezzing improves with inhaler - rhinorrhea at times, no postnasal drip or sore throat. No ear pain - No fevers at home  - has tried theraflu without no improvement  - no recent antibiotics   ROS: See HPI.  [place]:  PMH: HTN Asthma and Restrictive Lung Disease GERD Gastroparesis DM Osteoporosis RA HLD Obesity   PHYSICAL EXAM: BP 130/74   Pulse 89   Temp 98.5 F (36.9 C) (Oral)   Wt 164 lb 9.6 oz (74.7 kg)   SpO2 94%   BMI 28.70 kg/m  GEN: NAD HEENT: Atraumatic, normocephalic, neck supple, EOMI, sclera clear. Tenderness to palpation/percussion of maxillary sinuses and frontal sinuses bilaterally. TM normal bilaterally  CV: RRR, no murmurs, rubs, or gallops PULM: CTAB, normal effort SKIN: No rash or cyanosis; warm and well-perfused PSYCH: Mood and affect euthymic, normal rate and volume of speech NEURO: Awake, alert, no focal deficits grossly, normal speech   ASSESSMENT/PLAN:  1. Acute non-recurrent frontal sinusitis Antibiotic seems to be indicated since symptoms initially improving then with acute worsening. Augmentin BID for 7 days. Advised to take probiotic or yogurt to decrease SE of diarrhea.  - amoxicillin-clavulanate (AUGMENTIN) 875-125 MG tablet; Take 1 tablet by mouth 2 (two) times daily.  Dispense: 14 tablet; Refill: 0   Smiley Houseman, MD PGY [place]

## 2016-06-21 ENCOUNTER — Ambulatory Visit (INDEPENDENT_AMBULATORY_CARE_PROVIDER_SITE_OTHER): Payer: Medicare Other | Admitting: Internal Medicine

## 2016-06-21 ENCOUNTER — Encounter: Payer: Self-pay | Admitting: Internal Medicine

## 2016-06-21 VITALS — BP 130/74 | HR 89 | Temp 98.5°F | Wt 164.6 lb

## 2016-06-21 DIAGNOSIS — J0110 Acute frontal sinusitis, unspecified: Secondary | ICD-10-CM | POA: Diagnosis present

## 2016-06-21 MED ORDER — AMOXICILLIN-POT CLAVULANATE 875-125 MG PO TABS: 1.0000 | ORAL_TABLET | Freq: Two times a day (BID) | ORAL | 0 refills | Status: DC

## 2016-06-21 NOTE — Patient Instructions (Signed)
Please take Augmentin for 7 days for your sinus infection. Continue to take your allergy medication. The antibiotic may cause some diarrhea; you can take a probiotic for this to help prevent this.

## 2016-06-22 ENCOUNTER — Other Ambulatory Visit: Payer: Self-pay | Admitting: Family Medicine

## 2016-06-26 ENCOUNTER — Encounter: Payer: Self-pay | Admitting: Family Medicine

## 2016-06-26 ENCOUNTER — Ambulatory Visit (INDEPENDENT_AMBULATORY_CARE_PROVIDER_SITE_OTHER): Payer: Medicare Other | Admitting: Family Medicine

## 2016-06-26 VITALS — BP 116/62 | HR 105 | Temp 99.3°F | Wt 160.4 lb

## 2016-06-26 DIAGNOSIS — J0110 Acute frontal sinusitis, unspecified: Secondary | ICD-10-CM

## 2016-06-26 DIAGNOSIS — B379 Candidiasis, unspecified: Secondary | ICD-10-CM

## 2016-06-26 DIAGNOSIS — A084 Viral intestinal infection, unspecified: Secondary | ICD-10-CM | POA: Diagnosis not present

## 2016-06-26 MED ORDER — DOXYCYCLINE HYCLATE 100 MG PO TABS: 100.0000 mg | ORAL_TABLET | Freq: Two times a day (BID) | ORAL | 0 refills | Status: AC

## 2016-06-26 MED ORDER — FLUCONAZOLE 150 MG PO TABS: 150.0000 mg | ORAL_TABLET | Freq: Once | ORAL | 1 refills | Status: AC

## 2016-06-26 NOTE — Patient Instructions (Signed)
It was good to meet you today  For your sinus infection - I have changed your antibiotic to doxycycline 100mg  twice a day for 4 days to finish your course of treatment  For your yeast infection please take diflucan once. I have given you a refill in case you get another yeast infection after finishing your antibiotics.  I suspect you have a viral GI bug that is giving you a stomach flu on top of the sinus infection you were already dealing with. - Please stay well dehydrated with plenty of fluids.  Take care and seek immediate care sooner if you develop any concerns.   Dr. Bufford Lope, [place]

## 2016-06-26 NOTE — Progress Notes (Signed)
    Subjective:  Michelle [name] is a 65 y.o. female who presents to the [facility] today with a chief complaint of vomiting, dizziness, cough.   HPI: Acute sinusitis Recently seen on 06/21/16 for acute sinusitis, was prescribed Augmentin. Patient states took 3 days, last dose yesterday, but was not able to continue due to diarrhea and developed a yeast infection. Has continued to have frontal HA and nasal pain with cough. Has shortness of breath at baseline that has not worsened, continues to use albuterol inhaler as needed. Has also used OTC Tussin DM with some relief of her cough. Denies CP, sputum production, sore throat, ear pain.  Vaginal yeast infection  States started having vaginal discharge after starting antibiotics. Feels similar to yeast infections she has had in the past.  GI bug Over Christmas had multiple sick family members including several children with GI bug. States yesterday started vomiting, had poor po intake and became dizzy and fever with Tmax at home of 103.19F. Has also had urinary incontinence but has some amount of urinary urgency and frequency at baseline. Denies dysuria  ROS: Per HPI  Objective:  Physical Exam: BP 116/62   Pulse (!) 105   Temp 99.3 F (37.4 C) (Oral)   Wt 160 lb 6.4 oz (72.8 kg)   SpO2 93%   BMI 27.97 kg/m   Gen: NAD, resting comfortably HEENT: EOMI, conjunctiva normal. TMs clear b/l. MMM, oropharynx nonerythematous. CV: RRR with no murmurs appreciated Pulm: NWOB, CTAB with no crackles, wheezes, or rhonchi GI: Normal bowel sounds present. Soft, Nontender, Nondistended.  Skin: warm, dry Neuro: grossly normal, moves all extremities Psych: Normal affect and thought content  Assessment/Plan:  1. Acute frontal sinusitis Patient only partially completed augmentin course due to diarrhea with last dose yesterday so will switch to doxycycline today for 4 days to complete 7 day course. Advised patient to continue OTC cough medication for  symptomatic relief. Unlikely PNA given temp today is 99.3F, breathing is at baseline, and lung exam has good air movement without rhonchi.  2. Viral gastroenteritis Patient has had multiple sick contacts with vomiting so likely has viral gastroenteritis that is superimposed on sinusitis given the acute onset of vomiting yesterday. Advised patient that dizziness is likely due to being dehydrated from vomiting and poor po intake, encouraged good hydration. Suspect that urinary incontinence is secondary to GI irritation since seems to be worse when straining with vomiting and has no dysuria.  3. Vaginal yeast infection  Given patient has been on antibiotics and patient has had yeast infections in the past, will give diflucan with one refill so that can take one now and then has another for after finishing course of doxycycline.  Bufford Lope, DO PGY-1, [facility] 06/26/2016 3:17 PM

## 2016-07-03 ENCOUNTER — Encounter: Payer: Self-pay | Admitting: Family Medicine

## 2016-07-03 ENCOUNTER — Ambulatory Visit (INDEPENDENT_AMBULATORY_CARE_PROVIDER_SITE_OTHER): Payer: Medicare Other | Admitting: Family Medicine

## 2016-07-03 ENCOUNTER — Telehealth: Payer: Self-pay | Admitting: Family Medicine

## 2016-07-03 VITALS — BP 124/62 | HR 62 | Temp 98.8°F | Wt 159.0 lb

## 2016-07-03 DIAGNOSIS — K219 Gastro-esophageal reflux disease without esophagitis: Secondary | ICD-10-CM | POA: Diagnosis not present

## 2016-07-03 DIAGNOSIS — I10 Essential (primary) hypertension: Secondary | ICD-10-CM | POA: Diagnosis not present

## 2016-07-03 DIAGNOSIS — Z1159 Encounter for screening for other viral diseases: Secondary | ICD-10-CM

## 2016-07-03 DIAGNOSIS — L299 Pruritus, unspecified: Secondary | ICD-10-CM

## 2016-07-03 DIAGNOSIS — J329 Chronic sinusitis, unspecified: Secondary | ICD-10-CM

## 2016-07-03 DIAGNOSIS — G8929 Other chronic pain: Secondary | ICD-10-CM

## 2016-07-03 DIAGNOSIS — E119 Type 2 diabetes mellitus without complications: Secondary | ICD-10-CM

## 2016-07-03 LAB — POCT GLYCOSYLATED HEMOGLOBIN (HGB A1C): HEMOGLOBIN A1C: 7.2

## 2016-07-03 MED ORDER — CLONAZEPAM 1 MG PO TABS: 1.0000 mg | ORAL_TABLET | Freq: Two times a day (BID) | ORAL | 5 refills | Status: DC | PRN

## 2016-07-03 MED ORDER — OXYCODONE-ACETAMINOPHEN 10-325 MG PO TABS: 1.0000 | ORAL_TABLET | Freq: Four times a day (QID) | ORAL | 0 refills | Status: DC | PRN

## 2016-07-03 MED ORDER — RANITIDINE HCL 150 MG PO TABS: 150.0000 mg | ORAL_TABLET | Freq: Two times a day (BID) | ORAL | 3 refills | Status: DC

## 2016-07-03 MED ORDER — METFORMIN HCL 1000 MG PO TABS: 1000.0000 mg | ORAL_TABLET | Freq: Two times a day (BID) | ORAL | 3 refills | Status: DC

## 2016-07-03 MED ORDER — ALBUTEROL SULFATE HFA 108 (90 BASE) MCG/ACT IN AERS: INHALATION_SPRAY | RESPIRATORY_TRACT | 1 refills | Status: DC

## 2016-07-03 MED ORDER — ROSUVASTATIN CALCIUM 10 MG PO TABS: 10.0000 mg | ORAL_TABLET | Freq: Every day | ORAL | 3 refills | Status: DC

## 2016-07-03 MED ORDER — SERTRALINE HCL 50 MG PO TABS: 50.0000 mg | ORAL_TABLET | Freq: Every day | ORAL | 3 refills | Status: DC

## 2016-07-03 MED ORDER — GLUCOSE BLOOD VI STRP: ORAL_STRIP | 12 refills | Status: DC

## 2016-07-03 MED ORDER — BECLOMETHASONE DIPROPIONATE 40 MCG/ACT IN AERS: 2.0000 | INHALATION_SPRAY | Freq: Two times a day (BID) | RESPIRATORY_TRACT | 11 refills | Status: DC

## 2016-07-03 MED ORDER — SITAGLIPTIN PHOSPHATE 100 MG PO TABS: 100.0000 mg | ORAL_TABLET | Freq: Every day | ORAL | 3 refills | Status: DC

## 2016-07-03 NOTE — Assessment & Plan Note (Signed)
Current regimen improves pain and ability to function. Percocet refilled today. Needs updated UDS soon.

## 2016-07-03 NOTE — Assessment & Plan Note (Signed)
Stop omeprazole. Start ranitidine.

## 2016-07-03 NOTE — Assessment & Plan Note (Addendum)
A1c improved to 7.2 this morning. Continue metformin and januvia. Recheck in 3 months.

## 2016-07-03 NOTE — Assessment & Plan Note (Signed)
Stop cetirizine as symptoms have resolved.

## 2016-07-03 NOTE — Telephone Encounter (Signed)
Attempted to call patient to let her know her A1c improved to 7.2. Left call back number.  Algis Greenhouse. Jerline Pain, [place] Resident PGY-3 07/03/2016 9:41 AM

## 2016-07-03 NOTE — Assessment & Plan Note (Signed)
At goal.  Continue losartan. 

## 2016-07-03 NOTE — Patient Instructions (Addendum)
We will check blood work today.  We will check a CT scan of your sinuses. If you are not getting better, please let us know.  Continue using your ventolin every 4-6 hours over the next couple of days. If you are not getting better, let us know.  Try to cut down on your klonopin use. Klonopin has been associated with memory issues.  Stop using the omeprazole. Use the ranitidine twice daily instead.   We will refill your medications today.  Come back to see me in 3 months, or sooner if you need anything else.  Take care,  Dr Jerline Pain

## 2016-07-03 NOTE — Telephone Encounter (Signed)
Patient called back and made aware of results and no changes to medication. Jazmin Hartsell,CMA

## 2016-07-03 NOTE — Progress Notes (Signed)
    Subjective:  Michelle [name] is a 66 y.o. female who presents to the [facility] today with a chief complaint of sinus congestion.   HPI: Sinus Infection Patient with approximately a 4-5 week history of singus congestion, nasal pain, frontal headache. She was seen in clinic twice last month and given a course of augmentin and then doxycycline without significant improvement in her symptoms. Patient reports having a fever up to 103.75F last week. Also with some cough, shortness of breathing and wheezing. She has tried taking tylenol cough and cold medication which has not helped very much. Cough is non productive.   Itching No longer a problem for patient. Wishes to stop cetirizine.   GERD Wishes to stop omeprazole.   T2DM Patient currently on metformin and [place] which she has tolerated well without side effects. No polyuria or polydipsia.   Hypertension BP Readings from Last 3 Encounters:  07/03/16 124/62  06/26/16 116/62  06/21/16 130/74   Home BP monitoring-Yes Compliant with medications-yes, without side effects ROS-Denies any CP, blurry vision, LE edema, transient weakness, orthopnea, PND.   Chronic Pain Patient with chronic pain related to her RA. She is currently on remicaid infusions for her RA and has an appointment with her rheumatologist next week. Currently on percocet which helps with her pain and ability to function. No constipation. No falls.   ROS: Per HPI  PMH: Smoking history reviewed.    Objective:  Physical Exam: BP 124/62   Pulse 62   Temp 98.8 F (37.1 C) (Oral)   Wt 159 lb (72.1 kg)   SpO2 97%   BMI 27.72 kg/m   Gen: NAD, resting comfortably HEENT: Dry MM, OP clear. TMs clear bilaterally. Left maxillary sinus does not transilluminate.  CV: RRR with no murmurs appreciated Pulm: NWOB, a few scattered wheezes noted, otherwise clear GI: Normal bowel sounds present. Soft, Nontender, Nondistended. MSK: no edema, cyanosis, or clubbing noted Skin:  warm, dry Neuro: grossly normal, moves all extremities Psych: Normal affect and thought content  Results for orders placed or performed in visit on 07/03/16 (from the past 72 hour(s))  HgB A1c     Status: None   Collection Time: 07/03/16  9:05 AM  Result Value Ref Range   Hemoglobin A1C 7.2    Assessment/Plan:  Itching Stop cetirizine as symptoms have resolved.   GERD Stop omeprazole. Start ranitidine.   Diabetes mellitus without complication (HCC) 123456 improved to 7.2 this morning. Continue metformin and januvia. Recheck in 3 months.   Essential hypertension At goal. Continue losartan.   Encounter for chronic pain management Current regimen improves pain and ability to function. Percocet refilled today. Needs updated UDS soon.   Sinusitis Patient with ongoing symptoms for the past 4-5 weeks despite two courses of antibiotics. Patient is at an increased risk given her remicaid infusions for her RA and her diabetes. Will check CT maxillofacial to rule out complications. If not improving or CT scan shows complications, would consider referral to ENT for further management.   Algis Greenhouse. Jerline Pain, [place] [facility] Resident PGY-3 07/03/2016 9:40 AM

## 2016-07-05 ENCOUNTER — Other Ambulatory Visit: Payer: Self-pay | Admitting: Family Medicine

## 2016-07-05 ENCOUNTER — Telehealth: Payer: Self-pay | Admitting: *Deleted

## 2016-07-05 MED ORDER — GLUCOSE BLOOD VI STRP: ORAL_STRIP | 2 refills | Status: DC

## 2016-07-05 NOTE — Telephone Encounter (Signed)
PA faxed to [employer] for review.  Review process could take 24-72 hours to complete.  Azul Coffie L, RN  

## 2016-07-05 NOTE — Telephone Encounter (Signed)
Form completed and given to [place].  Algis Greenhouse. Jerline Pain, [place] Resident PGY-3 07/05/2016 1:59 PM

## 2016-07-05 NOTE — Telephone Encounter (Signed)
Received fax from [place] stating they need exactly number of testing and ICD-10 code for billing medicare.  Derl Barrow, RN

## 2016-07-05 NOTE — Telephone Encounter (Signed)
Pt stated she was told to call back if she changed her mind about taking prednisone. Pt did and would like it called into [facility] on [address]

## 2016-07-05 NOTE — Telephone Encounter (Signed)
Prior Authorization received from [employer] for Qvar.  PA form placed in provider box for completion. Derl Barrow, RN

## 2016-07-08 ENCOUNTER — Other Ambulatory Visit: Payer: Self-pay | Admitting: Family Medicine

## 2016-07-08 ENCOUNTER — Telehealth: Payer: Self-pay | Admitting: *Deleted

## 2016-07-08 DIAGNOSIS — Z1231 Encounter for screening mammogram for malignant neoplasm of breast: Secondary | ICD-10-CM

## 2016-07-08 MED ORDER — BUDESONIDE 90 MCG/ACT IN AEPB: 1.0000 | INHALATION_SPRAY | Freq: Two times a day (BID) | RESPIRATORY_TRACT | 3 refills | Status: DC

## 2016-07-08 NOTE — Telephone Encounter (Signed)
Formulary alternative sent in.  Algis Greenhouse. Jerline Pain, [place] Resident PGY-3 07/08/2016 1:36 PM

## 2016-07-08 NOTE — Telephone Encounter (Signed)
Received faxed refill request for QVar 40 mcg AER with note stating "Pulmicort is preferred by Insurance [phone]"  L. Silvano Rusk, RN, BSN

## 2016-07-08 NOTE — Telephone Encounter (Signed)
We did not discuss starting prednisone. If she is concerned about her sinuses, we need to wait until after the CT on Wednesday before prescribing any antibiotics.  Michelle [name]. Jerline Pain, [place] Resident PGY-3 07/08/2016 1:34 PM

## 2016-07-08 NOTE — Telephone Encounter (Signed)
Spoke with pt and she agreed with plan to wait until after Wednesday before being prescribed anything.

## 2016-07-10 ENCOUNTER — Ambulatory Visit (HOSPITAL_COMMUNITY)
Admission: RE | Admit: 2016-07-10 | Discharge: 2016-07-10 | Disposition: A | Discharge status: Home / Self Care | Payer: Medicare Other | Source: Ambulatory Visit | Attending: Family Medicine | Admitting: Family Medicine

## 2016-07-10 DIAGNOSIS — J329 Chronic sinusitis, unspecified: Secondary | ICD-10-CM | POA: Insufficient documentation

## 2016-07-11 ENCOUNTER — Telehealth: Payer: Self-pay | Admitting: Family Medicine

## 2016-07-11 DIAGNOSIS — M059 Rheumatoid arthritis with rheumatoid factor, unspecified: Secondary | ICD-10-CM | POA: Diagnosis not present

## 2016-07-11 MED ORDER — LEVOFLOXACIN 500 MG PO TABS: 500.0000 mg | ORAL_TABLET | Freq: Every day | ORAL | 0 refills | Status: DC

## 2016-07-11 NOTE — Telephone Encounter (Signed)
Per [place], another round of antibiotics was called in. Pt given instructions to be see if her sxs do not improve.

## 2016-07-11 NOTE — Telephone Encounter (Signed)
Attempted to call patient back about CT scan. Imaging shows continued sinusitis. Would consider an additional round of antibiotics (she has completed a course of augmentin and doxycycline thus far) or referral to ENT per patient preference.  Algis Greenhouse. Jerline Pain, [place] Resident PGY-3 07/11/2016 4:19 PM

## 2016-07-11 NOTE — Telephone Encounter (Signed)
Discussed CT results with patient. Will treat will a course of levaquin. If not improving, will refer to ENT.  Algis Greenhouse. Jerline Pain, [place] Resident PGY-3 07/11/2016 4:35 PM

## 2016-07-16 MED ORDER — BUDESONIDE 180 MCG/ACT IN AEPB: 2.0000 | INHALATION_SPRAY | Freq: Every day | RESPIRATORY_TRACT | 1 refills | Status: DC

## 2016-07-16 NOTE — Telephone Encounter (Signed)
Formulary alternative sent in.  Michelle [name]. Jerline Pain, [place] [facility] Resident PGY-3 07/16/2016 5:07 PM

## 2016-07-16 NOTE — Telephone Encounter (Signed)
PA for Qvar was declined by [employer].  Patient must have tried and failed Pulmicort Flexhaler bofore Qvar will be covered.  Reference number: [id number].  Denial placed in provider box for review.  Derl Barrow, RN

## 2016-07-16 NOTE — Addendum Note (Signed)
Addended by: Vivi Barrack on: 07/16/2016 05:07 PM   Modules accepted: Orders

## 2016-07-22 ENCOUNTER — Other Ambulatory Visit: Payer: Medicare Other

## 2016-07-22 DIAGNOSIS — I10 Essential (primary) hypertension: Secondary | ICD-10-CM | POA: Diagnosis not present

## 2016-07-22 DIAGNOSIS — Z1159 Encounter for screening for other viral diseases: Secondary | ICD-10-CM | POA: Diagnosis not present

## 2016-07-22 LAB — CBC
HCT: 35.4 % (ref 35.0–45.0)
Hemoglobin: 11.2 g/dL — ABNORMAL LOW (ref 11.7–15.5)
MCH: 28.6 pg (ref 27.0–33.0)
MCHC: 31.6 g/dL — AB (ref 32.0–36.0)
MCV: 90.5 fL (ref 80.0–100.0)
MPV: 9.5 fL (ref 7.5–12.5)
PLATELETS: 272 10*3/uL (ref 140–400)
RBC: 3.91 MIL/uL (ref 3.80–5.10)
RDW: 14.3 % (ref 11.0–15.0)
WBC: 7.8 10*3/uL (ref 3.8–10.8)

## 2016-07-22 LAB — LIPID PANEL
CHOL/HDL RATIO: 2.4 ratio (ref <5.0)
CHOLESTEROL: 106 mg/dL (ref <200)
HDL: 44 mg/dL — ABNORMAL LOW (ref >50)
LDL CALC: 44 mg/dL (ref <100)
TRIGLYCERIDES: 92 mg/dL (ref <150)
VLDL: 18 mg/dL (ref <30)

## 2016-07-22 LAB — COMPLETE METABOLIC PANEL WITH GFR
ALT: 14 U/L (ref 6–29)
AST: 16 U/L (ref 10–35)
Albumin: 4 g/dL (ref 3.6–5.1)
Alkaline Phosphatase: 63 U/L (ref 33–130)
BUN: 12 mg/dL (ref 7–25)
CHLORIDE: 104 mmol/L (ref 98–110)
CO2: 27 mmol/L (ref 20–31)
CREATININE: 0.46 mg/dL — AB (ref 0.50–0.99)
Calcium: 8.8 mg/dL (ref 8.6–10.4)
GFR, Est Non African American: >89 mL/min (ref >=60)
Glucose, Bld: 120 mg/dL — ABNORMAL HIGH (ref 65–99)
POTASSIUM: 4.2 mmol/L (ref 3.5–5.3)
Sodium: 137 mmol/L (ref 135–146)
Total Bilirubin: 0.3 mg/dL (ref 0.2–1.2)
Total Protein: 6.2 g/dL (ref 6.1–8.1)

## 2016-07-22 LAB — HEPATITIS C ANTIBODY: HCV Ab: NEGATIVE

## 2016-07-24 ENCOUNTER — Encounter: Payer: Self-pay | Admitting: Family Medicine

## 2016-07-26 ENCOUNTER — Telehealth: Payer: Self-pay | Admitting: *Deleted

## 2016-07-26 NOTE — Telephone Encounter (Signed)
Called patient to offer to schedule Annual Wellness Visit. Left message on patient's home and mobile VM to return call. Velora Heckler, RN

## 2016-07-26 NOTE — Telephone Encounter (Signed)
Pt declined. ep

## 2016-08-06 ENCOUNTER — Telehealth: Payer: Self-pay | Admitting: Family Medicine

## 2016-08-06 ENCOUNTER — Ambulatory Visit
Admission: RE | Admit: 2016-08-06 | Discharge: 2016-08-06 | Disposition: A | Discharge status: Home / Self Care | Payer: Medicare Other | Source: Ambulatory Visit | Attending: Family Medicine | Admitting: Family Medicine

## 2016-08-06 DIAGNOSIS — Z1231 Encounter for screening mammogram for malignant neoplasm of breast: Secondary | ICD-10-CM

## 2016-08-06 DIAGNOSIS — J329 Chronic sinusitis, unspecified: Secondary | ICD-10-CM

## 2016-08-06 NOTE — Telephone Encounter (Signed)
Referral to ENT placed.  Algis Greenhouse. Jerline Pain, [place] Resident PGY-3 08/06/2016 5:17 PM

## 2016-08-06 NOTE — Telephone Encounter (Signed)
After 3 rounds of antibotics, she still has a sinus infection.  She still has blisters and sore in her nose. Please advise

## 2016-08-07 NOTE — Telephone Encounter (Signed)
LM for patient informing her that this referral was placed and to make an appointment if she feels she needs to be seen in the meantime. Nai Borromeo,CMA

## 2016-08-15 DIAGNOSIS — M79641 Pain in right hand: Secondary | ICD-10-CM | POA: Diagnosis not present

## 2016-08-15 DIAGNOSIS — M65842 Other synovitis and tenosynovitis, left hand: Secondary | ICD-10-CM | POA: Diagnosis not present

## 2016-08-15 DIAGNOSIS — M059 Rheumatoid arthritis with rheumatoid factor, unspecified: Secondary | ICD-10-CM | POA: Diagnosis not present

## 2016-08-15 DIAGNOSIS — M79642 Pain in left hand: Secondary | ICD-10-CM | POA: Diagnosis not present

## 2016-08-28 DIAGNOSIS — J321 Chronic frontal sinusitis: Secondary | ICD-10-CM

## 2016-08-28 DIAGNOSIS — J320 Chronic maxillary sinusitis: Secondary | ICD-10-CM | POA: Insufficient documentation

## 2016-08-28 DIAGNOSIS — R51 Headache: Secondary | ICD-10-CM | POA: Diagnosis not present

## 2016-08-28 HISTORY — DX: Chronic maxillary sinusitis: J32.0

## 2016-08-28 HISTORY — DX: Chronic frontal sinusitis: J32.1

## 2016-09-02 ENCOUNTER — Ambulatory Visit: Payer: Medicare Other | Admitting: Family Medicine

## 2016-09-04 DIAGNOSIS — J449 Chronic obstructive pulmonary disease, unspecified: Secondary | ICD-10-CM | POA: Diagnosis not present

## 2016-09-04 DIAGNOSIS — E119 Type 2 diabetes mellitus without complications: Secondary | ICD-10-CM | POA: Diagnosis not present

## 2016-09-04 DIAGNOSIS — M158 Other polyosteoarthritis: Secondary | ICD-10-CM | POA: Diagnosis not present

## 2016-09-04 DIAGNOSIS — Z7984 Long term (current) use of oral hypoglycemic drugs: Secondary | ICD-10-CM | POA: Diagnosis not present

## 2016-09-04 DIAGNOSIS — M059 Rheumatoid arthritis with rheumatoid factor, unspecified: Secondary | ICD-10-CM | POA: Diagnosis not present

## 2016-09-04 DIAGNOSIS — K219 Gastro-esophageal reflux disease without esophagitis: Secondary | ICD-10-CM | POA: Diagnosis not present

## 2016-09-04 DIAGNOSIS — E785 Hyperlipidemia, unspecified: Secondary | ICD-10-CM | POA: Diagnosis not present

## 2016-09-04 DIAGNOSIS — M7062 Trochanteric bursitis, left hip: Secondary | ICD-10-CM | POA: Diagnosis not present

## 2016-09-04 DIAGNOSIS — Z7982 Long term (current) use of aspirin: Secondary | ICD-10-CM | POA: Diagnosis not present

## 2016-09-04 DIAGNOSIS — M153 Secondary multiple arthritis: Secondary | ICD-10-CM | POA: Diagnosis not present

## 2016-09-04 DIAGNOSIS — J329 Chronic sinusitis, unspecified: Secondary | ICD-10-CM | POA: Diagnosis not present

## 2016-09-04 DIAGNOSIS — J45991 Cough variant asthma: Secondary | ICD-10-CM | POA: Diagnosis not present

## 2016-09-04 DIAGNOSIS — Z7952 Long term (current) use of systemic steroids: Secondary | ICD-10-CM | POA: Diagnosis not present

## 2016-09-04 DIAGNOSIS — Z79899 Other long term (current) drug therapy: Secondary | ICD-10-CM | POA: Diagnosis not present

## 2016-09-04 DIAGNOSIS — Z79891 Long term (current) use of opiate analgesic: Secondary | ICD-10-CM | POA: Diagnosis not present

## 2016-09-18 ENCOUNTER — Telehealth: Payer: Self-pay | Admitting: Family Medicine

## 2016-09-18 ENCOUNTER — Other Ambulatory Visit: Payer: Self-pay | Admitting: Family Medicine

## 2016-09-18 NOTE — Telephone Encounter (Signed)
Pt wants a 90 day supply of omeprazole. Pt states she tried something else, but it didn't work and wants to go back on omeprazole. Pt uses [facility] on [address]

## 2016-09-18 NOTE — Telephone Encounter (Signed)
Pt calling to request refill of: 90 day supply  Name of Medication(s):  gabapentin  Last date of OV: 07-03-16 Pharmacy:  [facility].  Will route refill request to Clinic RN.  Discussed with patient policy to call pharmacy for future refills.  Also, discussed refills may take up to 48 hours to approve or deny.  Renella Cunas

## 2016-09-18 NOTE — Telephone Encounter (Signed)
Pt needs PA on Januvia and needs a 90 day supply. Pt uses [facility] on [address]

## 2016-09-18 NOTE — Telephone Encounter (Signed)
Prior Authorization received from [employer] for januvia 100 mg.  PA form placed in provider box for completion. Derl Barrow, RN

## 2016-09-19 ENCOUNTER — Other Ambulatory Visit: Payer: Self-pay | Admitting: Family Medicine

## 2016-09-19 DIAGNOSIS — Z23 Encounter for immunization: Secondary | ICD-10-CM | POA: Diagnosis not present

## 2016-09-19 MED ORDER — OMEPRAZOLE 20 MG PO CPDR: 20.0000 mg | DELAYED_RELEASE_CAPSULE | Freq: Every day | ORAL | 0 refills | Status: DC

## 2016-09-19 MED ORDER — GABAPENTIN 300 MG PO CAPS: 300.0000 mg | ORAL_CAPSULE | Freq: Every day | ORAL | 3 refills | Status: DC

## 2016-09-19 NOTE — Telephone Encounter (Signed)
Medication sent to pharmacy  

## 2016-09-19 NOTE — Telephone Encounter (Signed)
Pt states the PA was denied and wants to know what do now. ep

## 2016-09-19 NOTE — Telephone Encounter (Signed)
Completed and returned to Tamika.  

## 2016-09-19 NOTE — Telephone Encounter (Signed)
PA form faxed to [facility] for review.  Review process could take 24-72 hours to complete.  Martin, Tamika L, RN  

## 2016-09-23 ENCOUNTER — Other Ambulatory Visit: Payer: Self-pay | Admitting: Family Medicine

## 2016-09-23 ENCOUNTER — Telehealth: Payer: Self-pay | Admitting: Family Medicine

## 2016-09-23 MED ORDER — GLUCOSE BLOOD VI STRP: ORAL_STRIP | 2 refills | Status: DC

## 2016-09-23 MED ORDER — SAXAGLIPTIN HCL 5 MG PO TABS: 5.0000 mg | ORAL_TABLET | Freq: Every day | ORAL | 3 refills | Status: DC

## 2016-09-23 NOTE — Addendum Note (Signed)
Addended by: Vivi Barrack on: 09/23/2016 02:34 PM   Modules accepted: Orders

## 2016-09-23 NOTE — Telephone Encounter (Signed)
pt calling to request refill of:  Name of Medication(s): test strips  Last date of OV: 07-03-16 [place] Will route refill request to [employer].  Discussed with patient policy to call pharmacy for future refills.  Also, discussed refills may take up to 48 hours to approve or deny.  Michelle [name]  Pt needs 3 month supply of her test strips.  She is also wondering about the status of her Januvia

## 2016-09-23 NOTE — Telephone Encounter (Signed)
Formulary alternative sent in.  Algis Greenhouse. Jerline Pain, [place] [facility] Resident PGY-3 09/23/2016 2:34 PM

## 2016-09-23 NOTE — Telephone Encounter (Signed)
Rx for test strips sent in.  They may need to be signed by a PECOS certified MD.  Michelle [name]. Jerline Pain, [place] [facility] Resident PGY-3 09/23/2016 2:08 PM

## 2016-09-23 NOTE — Telephone Encounter (Signed)
Will forward to PCP.  Martin, Tamika L, RN  

## 2016-09-23 NOTE — Telephone Encounter (Signed)
Was there a list of formulary alternatives that would be able to be sent in?  Algis Greenhouse. Jerline Pain, [place] Resident PGY-3 09/23/2016 1:53 PM

## 2016-09-24 MED ORDER — GLUCOSE BLOOD VI STRP: ORAL_STRIP | 2 refills | Status: DC

## 2016-09-24 NOTE — Addendum Note (Signed)
Addended by: Derl Barrow on: 09/24/2016 08:06 AM   Modules accepted: Orders

## 2016-09-26 ENCOUNTER — Other Ambulatory Visit: Payer: Self-pay | Admitting: Family Medicine

## 2016-09-26 MED ORDER — LINAGLIPTIN 5 MG PO TABS: 5.0000 mg | ORAL_TABLET | Freq: Every day | ORAL | 11 refills | Status: DC

## 2016-09-26 NOTE — Progress Notes (Signed)
Received prior authorization for onglyza in box. Sent in alternative tradjenta.  Algis Greenhouse. Jerline Pain, [place] Resident PGY-3 09/26/2016 8:44 AM

## 2016-10-02 DIAGNOSIS — R51 Headache: Secondary | ICD-10-CM | POA: Diagnosis not present

## 2016-10-02 DIAGNOSIS — J321 Chronic frontal sinusitis: Secondary | ICD-10-CM | POA: Diagnosis not present

## 2016-10-02 DIAGNOSIS — R609 Edema, unspecified: Secondary | ICD-10-CM | POA: Diagnosis not present

## 2016-10-02 DIAGNOSIS — J320 Chronic maxillary sinusitis: Secondary | ICD-10-CM | POA: Diagnosis not present

## 2016-10-04 ENCOUNTER — Ambulatory Visit (INDEPENDENT_AMBULATORY_CARE_PROVIDER_SITE_OTHER): Payer: Medicare Other | Admitting: Family Medicine

## 2016-10-04 ENCOUNTER — Telehealth: Payer: Self-pay | Admitting: *Deleted

## 2016-10-04 ENCOUNTER — Encounter: Payer: Self-pay | Admitting: Family Medicine

## 2016-10-04 VITALS — BP 124/64 | HR 85 | Temp 98.5°F | Ht 64.0 in | Wt 158.0 lb

## 2016-10-04 DIAGNOSIS — E119 Type 2 diabetes mellitus without complications: Secondary | ICD-10-CM | POA: Diagnosis present

## 2016-10-04 DIAGNOSIS — L299 Pruritus, unspecified: Secondary | ICD-10-CM

## 2016-10-04 DIAGNOSIS — R5383 Other fatigue: Secondary | ICD-10-CM | POA: Insufficient documentation

## 2016-10-04 DIAGNOSIS — G8929 Other chronic pain: Secondary | ICD-10-CM

## 2016-10-04 DIAGNOSIS — I10 Essential (primary) hypertension: Secondary | ICD-10-CM | POA: Diagnosis not present

## 2016-10-04 DIAGNOSIS — K219 Gastro-esophageal reflux disease without esophagitis: Secondary | ICD-10-CM

## 2016-10-04 LAB — POCT GLYCOSYLATED HEMOGLOBIN (HGB A1C): Hemoglobin A1C: 7.6

## 2016-10-04 MED ORDER — OXYCODONE-ACETAMINOPHEN 10-325 MG PO TABS: 1.0000 | ORAL_TABLET | Freq: Four times a day (QID) | ORAL | 0 refills | Status: DC | PRN

## 2016-10-04 MED ORDER — ALBUTEROL SULFATE HFA 108 (90 BASE) MCG/ACT IN AERS: INHALATION_SPRAY | RESPIRATORY_TRACT | 1 refills | Status: DC

## 2016-10-04 MED ORDER — GLUCOSE BLOOD VI STRP: ORAL_STRIP | 2 refills | Status: DC

## 2016-10-04 MED ORDER — FLUTICASONE PROPIONATE 50 MCG/ACT NA SUSP: 2.0000 | Freq: Every day | NASAL | 3 refills | Status: DC

## 2016-10-04 MED ORDER — CETIRIZINE HCL 10 MG PO TABS: 10.0000 mg | ORAL_TABLET | Freq: Every day | ORAL | 3 refills | Status: DC

## 2016-10-04 MED ORDER — OXYCODONE-ACETAMINOPHEN 10-325 MG PO TABS
1.0000 | ORAL_TABLET | Freq: Four times a day (QID) | ORAL | 0 refills | Status: DC | PRN
Start: 2016-10-04 — End: 2016-10-04

## 2016-10-04 NOTE — Telephone Encounter (Signed)
Spoke with patient and she states that she has already picked up her medications.  Akul Leggette,CMA

## 2016-10-04 NOTE — Telephone Encounter (Signed)
Per patient klonopin and oxycodone are not able to be filled under PCP's name due to his DEA # not working.  Spoke with pharmacy and confirmed that they had the correct number and also that the problem is showing that provider's DEA isn't authorized to prescribe controlled medications.  Pharmacist will check with their help desk to see what can be done.  He will call back if there is something else we can do. Candita Borenstein,CMA

## 2016-10-04 NOTE — Assessment & Plan Note (Signed)
Likely multifactorial in setting of several sedating medications and poor sleep quality. Her last Hgb was 11.2 with normal MCV - doubt iron deficient or B12 deficiency. Will check TSH. Advised patient to avoid drinking large amounts of fluid before bedtime to see if that helps her sleep for longer stretches of time. Follow up in 3 months.

## 2016-10-04 NOTE — Assessment & Plan Note (Signed)
Patient not able to tolerating H2 blocker only. Discussed risks of long term PPI use. Will restart omeprazole. Continue to attempt to wean off in the future.

## 2016-10-04 NOTE — Patient Instructions (Signed)
You A1c today was 7.6.  Continue using the metformin and tradjenta.  Come back in 3 months for a recheck.  We will check your thyroid today.  I sent in your refills.  Take care,  Dr Jerline Pain

## 2016-10-04 NOTE — Progress Notes (Signed)
    Subjective:  Michelle [name] is a 66 y.o. female who presents to the [facility] today with a chief complaint of T2DM follow up.   HPI:  T2DM Currently on metformin and tradjenta. She only recently started her tradjenta as her insurance stopped covering her [place]. She has been tolerating this regimen without side effects. No polyuria or polydipsia.   Hypertension BP Readings from Last 3 Encounters:  10/04/16 124/64  07/03/16 124/62  06/26/16 116/62   Home BP monitoring-yes Compliant with medications-yes, without side effects ROS-Denies any CP, HA, blurry vision, LE edema, transient weakness, orthopnea, PND.   Chronic Pain Stable since her last visit. Has chronic pain secondary to RA. She will have a remicaid infusion next month and has an appointment with rhuem in 2 months. Currently on percocet which helps with her pain and functional ability. No constipation or other noticeable side effects. She does not think the gabapentin is working and would like to stop.  GERD Attempted to stop omeprazole but had intolerable reflux. Has since started back on omeprazole.   Fatigue Patient concerned about her blood counts and thyroid levels. Feels fatigued during the day. Does not dose off during the day. Wakes up frequently at night to urinate.   Itching Started back on cetrizine which has helped.   ROS: Per HPI  PMH: Smoking history reviewed.   Objective:  Physical Exam: BP 124/64   Pulse 85   Temp 98.5 F (36.9 C) (Oral)   Ht 5\' 4"  (1.626 m)   Wt 158 lb (71.7 kg)   SpO2 96%   BMI 27.12 kg/m   Gen: NAD, resting comfortably CV: RRR with no murmurs appreciated Pulm: NWOB, a single wheeze in her RUL field, otherwise clear.  GI: Normal bowel sounds present. Soft, Nontender, Nondistended. MSK: no edema, cyanosis, or clubbing noted Skin: warm, dry Neuro: grossly normal, moves all extremities Psych: Normal affect and thought content  Results for orders placed or performed in  visit on 10/04/16 (from the past 72 hour(s))  HgB A1c     Status: None   Collection Time: 10/04/16  8:15 AM  Result Value Ref Range   Hemoglobin A1C 7.6    Assessment/Plan:  Diabetes mellitus without complication (HCC) K9T increased to 7.6 today. Will continue metformin and tradjenta. Follow up in 3 months.   Essential hypertension At goal. Continue losartan.   Encounter for chronic pain management Refilled percocet today. Database reviewed without red flags. Will stop gabapentin as it does not seem to be significantly helping patient. Follow up in 3 months.   GERD Patient not able to tolerating H2 blocker only. Discussed risks of long term PPI use. Will restart omeprazole. Continue to attempt to wean off in the future.   Itching Symptoms have started back again. Will restart cetirizine which has helped for this in the past.   Fatigue Likely multifactorial in setting of several sedating medications and poor sleep quality. Her last Hgb was 11.2 with normal MCV - doubt iron deficient or B12 deficiency. Will check TSH. Advised patient to avoid drinking large amounts of fluid before bedtime to see if that helps her sleep for longer stretches of time. Follow up in 3 months.   Algis Greenhouse. Jerline Pain, [place] Resident PGY-3 10/04/2016 9:35 AM

## 2016-10-04 NOTE — Assessment & Plan Note (Signed)
At goal.  Continue losartan. 

## 2016-10-04 NOTE — Assessment & Plan Note (Signed)
Symptoms have started back again. Will restart cetirizine which has helped for this in the past.

## 2016-10-04 NOTE — Assessment & Plan Note (Signed)
Refilled percocet today. Database reviewed without red flags. Will stop gabapentin as it does not seem to be significantly helping patient. Follow up in 3 months.

## 2016-10-04 NOTE — Assessment & Plan Note (Signed)
A1c increased to 7.6 today. Will continue metformin and tradjenta. Follow up in 3 months.

## 2016-10-05 LAB — TSH: TSH: 3.32 u[IU]/mL (ref 0.450–4.500)

## 2016-10-07 ENCOUNTER — Telehealth: Payer: Self-pay | Admitting: *Deleted

## 2016-10-07 DIAGNOSIS — L812 Freckles: Secondary | ICD-10-CM | POA: Diagnosis not present

## 2016-10-07 DIAGNOSIS — L578 Other skin changes due to chronic exposure to nonionizing radiation: Secondary | ICD-10-CM | POA: Diagnosis not present

## 2016-10-07 DIAGNOSIS — L820 Inflamed seborrheic keratosis: Secondary | ICD-10-CM | POA: Diagnosis not present

## 2016-10-07 DIAGNOSIS — C44729 Squamous cell carcinoma of skin of left lower limb, including hip: Secondary | ICD-10-CM | POA: Diagnosis not present

## 2016-10-07 DIAGNOSIS — M069 Rheumatoid arthritis, unspecified: Secondary | ICD-10-CM | POA: Diagnosis not present

## 2016-10-07 DIAGNOSIS — L280 Lichen simplex chronicus: Secondary | ICD-10-CM | POA: Diagnosis not present

## 2016-10-07 DIAGNOSIS — L821 Other seborrheic keratosis: Secondary | ICD-10-CM | POA: Diagnosis not present

## 2016-10-07 DIAGNOSIS — Z1283 Encounter for screening for malignant neoplasm of skin: Secondary | ICD-10-CM | POA: Diagnosis not present

## 2016-10-07 DIAGNOSIS — D692 Other nonthrombocytopenic purpura: Secondary | ICD-10-CM | POA: Diagnosis not present

## 2016-10-07 DIAGNOSIS — Z85828 Personal history of other malignant neoplasm of skin: Secondary | ICD-10-CM | POA: Diagnosis not present

## 2016-10-07 DIAGNOSIS — D485 Neoplasm of uncertain behavior of skin: Secondary | ICD-10-CM | POA: Diagnosis not present

## 2016-10-07 NOTE — Telephone Encounter (Signed)
Prior Authorization received from [employer] for [employer]. PA form placed in provider box for completion. Derl Barrow, RN

## 2016-10-08 ENCOUNTER — Encounter: Payer: Self-pay | Admitting: Family Medicine

## 2016-10-08 NOTE — Telephone Encounter (Signed)
PA form completed and given to [place].  Algis Greenhouse. Jerline Pain, [place] Resident PGY-3 10/08/2016 12:17 PM

## 2016-10-08 NOTE — Telephone Encounter (Signed)
PA completed online at www.covermymeds.com.  PA is pending for review per [facility]. Review process could take 24-72 hours to complete.  Derl Barrow, RN

## 2016-10-09 NOTE — Telephone Encounter (Signed)
Received forms from [facility] [facility] stating PA is incomplete. Forms placed in PCP box.

## 2016-10-10 NOTE — Telephone Encounter (Signed)
Additional information is needed for PA for percocet.  Form placed in provider box for review.  Derl Barrow, RN

## 2016-10-10 NOTE — Telephone Encounter (Signed)
Check the status of PA for Percocet; still pending per covermymeds.com.  Derl Barrow, RN

## 2016-10-11 NOTE — Telephone Encounter (Signed)
Form completed and given to tamika.  Algis Greenhouse. Jerline Pain, [place] Resident PGY-3 10/11/2016 11:45 AM

## 2016-10-11 NOTE — Telephone Encounter (Signed)
Received denial for percocet from [employer] stating that additional information had not been provided. Note routed to PCP. Hubbard Hartshorn, RN, BSN

## 2016-10-11 NOTE — Telephone Encounter (Signed)
Form placed in to-be-faxed box in front office. Hubbard Hartshorn, RN, BSN

## 2016-10-21 ENCOUNTER — Telehealth: Payer: Self-pay

## 2016-10-21 MED ORDER — GLUCOSE BLOOD VI STRP: ORAL_STRIP | 2 refills | Status: DC

## 2016-10-21 NOTE — Telephone Encounter (Signed)
Needs Rx for Accu-check Aviva Plus test strips sent in. The Rx needs to be written for 300 strips, check sugar 4 times daily. That way she will have a 90 day supply and save her money. Ottis Stain, CMA

## 2016-10-21 NOTE — Telephone Encounter (Signed)
Rx sent.  Algis Greenhouse. Jerline Pain, [place] Resident PGY-3 10/21/2016 9:45 PM

## 2016-10-22 ENCOUNTER — Telehealth: Payer: Self-pay | Admitting: Family Medicine

## 2016-10-22 MED ORDER — GLUCOSE BLOOD VI STRP: ORAL_STRIP | 2 refills | Status: DC

## 2016-10-22 NOTE — Telephone Encounter (Signed)
The test strips that were called in today are unable to be filled because PCP is not PCOs certified. [place]

## 2016-10-22 NOTE — Telephone Encounter (Signed)
Test strips resent to [facility] under precept Dr. McDiarmid.  Derl Barrow, RN

## 2016-10-28 DIAGNOSIS — J320 Chronic maxillary sinusitis: Secondary | ICD-10-CM | POA: Diagnosis not present

## 2016-10-30 DIAGNOSIS — M059 Rheumatoid arthritis with rheumatoid factor, unspecified: Secondary | ICD-10-CM | POA: Diagnosis not present

## 2016-10-30 DIAGNOSIS — Z79899 Other long term (current) drug therapy: Secondary | ICD-10-CM | POA: Diagnosis not present

## 2016-11-11 DIAGNOSIS — Z87891 Personal history of nicotine dependence: Secondary | ICD-10-CM | POA: Diagnosis not present

## 2016-11-11 DIAGNOSIS — J343 Hypertrophy of nasal turbinates: Secondary | ICD-10-CM | POA: Diagnosis not present

## 2016-11-11 DIAGNOSIS — J320 Chronic maxillary sinusitis: Secondary | ICD-10-CM | POA: Diagnosis not present

## 2016-11-11 DIAGNOSIS — R51 Headache: Secondary | ICD-10-CM | POA: Diagnosis not present

## 2016-11-13 DIAGNOSIS — H40013 Open angle with borderline findings, low risk, bilateral: Secondary | ICD-10-CM | POA: Diagnosis not present

## 2016-11-13 DIAGNOSIS — E119 Type 2 diabetes mellitus without complications: Secondary | ICD-10-CM | POA: Diagnosis not present

## 2016-11-13 DIAGNOSIS — H2511 Age-related nuclear cataract, right eye: Secondary | ICD-10-CM | POA: Diagnosis not present

## 2016-11-13 DIAGNOSIS — H40031 Anatomical narrow angle, right eye: Secondary | ICD-10-CM | POA: Diagnosis not present

## 2016-11-13 DIAGNOSIS — Z961 Presence of intraocular lens: Secondary | ICD-10-CM | POA: Diagnosis not present

## 2016-11-13 LAB — HM DIABETES EYE EXAM

## 2016-11-14 ENCOUNTER — Ambulatory Visit (INDEPENDENT_AMBULATORY_CARE_PROVIDER_SITE_OTHER): Payer: Medicare Other | Admitting: Family Medicine

## 2016-11-14 ENCOUNTER — Encounter: Payer: Self-pay | Admitting: Family Medicine

## 2016-11-14 DIAGNOSIS — R21 Rash and other nonspecific skin eruption: Secondary | ICD-10-CM

## 2016-11-14 DIAGNOSIS — J45909 Unspecified asthma, uncomplicated: Secondary | ICD-10-CM | POA: Diagnosis not present

## 2016-11-14 MED ORDER — MOMETASONE FUROATE 200 MCG/ACT IN AERO: 1.0000 | INHALATION_SPRAY | Freq: Two times a day (BID) | RESPIRATORY_TRACT | 1 refills | Status: DC

## 2016-11-14 NOTE — Progress Notes (Signed)
   Subjective:    Patient ID: Michelle [name], female    DOB: February 06, 1951, 66 y.o.   MRN: [id number]  HPI 66 y/o female presents for evaluation of cough.  PMH includes Asthma, Allergic rhinitis, Restrictive lung disease (?due to Methotrexate lung toxicity, not collaborated by Pulmonology), GERD, Pulmonary nodules (stable per Pulmonology notes), and RA. Followed by ENT at [facility]. Followed by Rheumatology [facility]. Followed by Pulmonology [facility].   Reviewed telephone note from Pulmonology on 5/16. Patient called to report increased cough that was unproductive. Also having increased back pain with deep inspiration. Sinus surgery on 10/28/16 and prescribed 5 day course of antibiotics. Patient felt like she needed course of steroids. Pulmonology recommended follow up with PCP.   Reviewed Pulmonology note from 12/2015 - patient with cough variant asthma, stopped Qvar as had not been taking and symptoms stable, continue albuterol and Flonase  Current respiratory medications include: Albuterol,  Flonase  Cough Present for month, started with viral type illness (heaaches, congestion, etc), cough persisted, mostly at night, can cause some sob, non-productive, pain in chest with deep breath, no half swelling, does have a history of intermittent chest pain, no nausea, no vomiting, does report continue itchy eyes/ watery eyes, no sore throat, nasal congestion improved with recent sinus infection  PFH - brother passed of lung cancer in 40's   Takes albuterol (1-2 times per day) and flonase, can not stand the pulmicort (feels like choking on powder), likes Qvar but insurance does not cover  Skin Rash Itching for past 2 weeks, presents over multiple body parts, has attempted Triamcinolone, took Hydroxycine once with minimal relief, no changes in soaps/detergents, started on arms and now on chest, started taking care of two wild rabbits when symptoms started.   Review of Systems See above.       Objective:   Physical Exam BP 138/76   Pulse 88   Temp 98.4 F (36.9 C) (Oral)   Wt 156 lb 9.6 oz (71 kg)   SpO2 94%   BMI 26.88 kg/m   Gen: pleasant female, NAD HEENT: normocephalic, PERRL, EOMI, no scleral icterus, nasal septum midline, turbinates inflamed, MMM, uvula midline, neck supple, no adenopathy Cardiac: RRR, S1 and S2 present, no murmur Resp: CTAB, normal effort, anterior chest pain not reproducible Skin: excoriated erythematous macules of arms, trunk, and back.   Chest CT 01/2015 Impression Sub 5 mm pulmonary nodules, unchanged since 01/2014 and are likely benign  Well's Score of 1.5 due to recent sinus surgery (same day surgery, able to ambulate later in the day)      Assessment & Plan:  Asthma, chronic Persistent cough s/p viral illness. No acute asthma exacerbation based on exam. Clinically suspect post viral cough. Low likelihood for PE (Well's score 1.5 with recent sinus surgery). Patient does have a history of pulmonary nodule however most recent CT showed stable nodule.  -start Mometasone inhaler (unable to tolerate Pulmicort inhaler, insurance does not cover Qvar).  -continue albuterol as needed  Rash and nonspecific skin eruption Excoriated erythematous macules of arms, trunk, and back consistent with bites (mostly likely mites from wild rabbits that patient has been caring for). -will likely resolve now that she is no longer caring for the rabbits -use daily Zyrtec -continue prn Triamcinolone cream

## 2016-11-14 NOTE — Patient Instructions (Addendum)
It was nice to see you today.   Rash - likely due to mites from the rabbits. Continue to Cetirizine daily. Apply steroid cream as needed. I suspect these will start to go away now that you are not caring for the rabbits. Please return in 1-2 weeks of not improving.  Cough - likely related to your asthma, start to take Mometasone inhaler (one puff daily). This is a steroid that will hopefully decrease the irritation in your lungs. Continue to use albuterol as needed.

## 2016-11-14 NOTE — Assessment & Plan Note (Signed)
Excoriated erythematous macules of arms, trunk, and back consistent with bites (mostly likely mites from wild rabbits that patient has been caring for). -will likely resolve now that she is no longer caring for the rabbits -use daily Zyrtec -continue prn Triamcinolone cream

## 2016-11-14 NOTE — Assessment & Plan Note (Signed)
Persistent cough s/p viral illness. No acute asthma exacerbation based on exam. Clinically suspect post viral cough. Low likelihood for PE (Well's score 1.5 with recent sinus surgery). Patient does have a history of pulmonary nodule however most recent CT showed stable nodule.  -start Mometasone inhaler (unable to tolerate Pulmicort inhaler, insurance does not cover Qvar).  -continue albuterol as needed

## 2016-11-18 ENCOUNTER — Telehealth: Payer: Self-pay | Admitting: *Deleted

## 2016-11-18 NOTE — Telephone Encounter (Signed)
Prior Authorization received from [employer] for Asmanex HFA 200 mcg. PA completed online at www.covermymeds.com. PA is pending per [facility]. Derl Barrow, RN

## 2016-11-18 NOTE — Telephone Encounter (Signed)
PA approved for asmanex HFA via [employer] until 06/30/17. Reference number: [id number]. Derl Barrow, RN

## 2016-11-19 ENCOUNTER — Telehealth: Payer: Self-pay | Admitting: Family Medicine

## 2016-11-19 ENCOUNTER — Encounter: Payer: Self-pay | Admitting: Family Medicine

## 2016-11-19 ENCOUNTER — Ambulatory Visit (INDEPENDENT_AMBULATORY_CARE_PROVIDER_SITE_OTHER): Payer: Medicare Other | Admitting: Family Medicine

## 2016-11-19 VITALS — BP 122/68 | HR 93 | Temp 98.1°F | Ht 64.0 in | Wt 155.8 lb

## 2016-11-19 DIAGNOSIS — M7062 Trochanteric bursitis, left hip: Secondary | ICD-10-CM

## 2016-11-19 DIAGNOSIS — M7060 Trochanteric bursitis, unspecified hip: Secondary | ICD-10-CM | POA: Insufficient documentation

## 2016-11-19 DIAGNOSIS — M25512 Pain in left shoulder: Secondary | ICD-10-CM | POA: Diagnosis present

## 2016-11-19 NOTE — Telephone Encounter (Signed)
Patient told me Michelle [name], but maybe she wants referral changed to [name]??  I really don't have a preference.  [name], MD, MPH PGY-3,  [facility] 11/19/2016 11:41 AM

## 2016-11-19 NOTE — Telephone Encounter (Signed)
Pt was seen in the [facility] clinic by Dr. Brita Romp. She would like the referral to go to [facility] since she has been there in the past. jw

## 2016-11-19 NOTE — Assessment & Plan Note (Signed)
Exam consistent with greater trochanteric bursitis of the left hip Offered corticosteroid injection, the patient declined Given handout on conservative treatment

## 2016-11-19 NOTE — Patient Instructions (Signed)
Nice to meet you today.  We will have you see Orthopedics for your shoulder for evaluation of rotator cuff.  Your hip pain is likely bursitis.  We discussed a steroid injection and decided to wait on this for now.  Take Care, Dr B  Trochanteric Bursitis Trochanteric bursitis is a condition that causes hip pain. Trochanteric bursitis happens when fluid-filled sacs (bursae) in the hip get irritated. Normally these sacs absorb shock and help strong bands of tissue (tendons) in your hip glide smoothly over each other and over your hip bones. What are the causes? This condition results from increased friction between the hip bones and the tendons that go over them. This condition can happen if you:  Have weak hips.  Use your hip muscles too much (overuse).  Get hit in the hip. What increases the risk? This condition is more likely to develop in:  Women.  Adults who are middle-aged or older.  People with arthritis or a spinal condition.  People with weak buttocks muscles (gluteal muscles).  People who have one leg that is shorter than the other.  People who participate in certain kinds of athletic activities, such as:  Running sports, especially long-distance running.  Contact sports, like football or martial arts.  Sports in which falls may occur, like skiing. What are the signs or symptoms? The main symptom of this condition is pain and tenderness over the point of your hip. The pain may be:  Sharp and intense.  Dull and achy.  Felt on the outside of your thigh. It may increase when you:  Lie on your side.  Walk or run.  Go up on stairs.  Sit.  Stand up after sitting.  Stand for long periods of time. How is this diagnosed? This condition may be diagnosed based on:  Your symptoms.  Your medical history.  A physical exam.  Imaging tests, such as:  X-rays to check your bones.  An MRI or ultrasound to check your tendons and muscles. During your physical  exam, your health care provider will check the movement and strength of your hip. He or she may press on the point of your hip to check for pain. How is this treated? This condition may be treated by:  Resting.  Reducing your activity.  Avoiding activities that cause pain.  Using crutches, a cane, or a walker to decrease the strain on your hip.  Taking medicine to help with swelling.  Having medicine injected into the bursae to help with swelling.  Using ice, heat, and massage therapy for pain relief.  Physical therapy exercises for strength and flexibility.  Surgery (rare). Follow these instructions at home: Activity   Rest.  Avoid activities that cause pain.  Return to your normal activities as told by your health care provider. Ask your health care provider what activities are safe for you. Managing pain, stiffness, and swelling   Take over-the-counter and prescription medicines only as told by your health care provider.  If directed, apply heat to the injured area as told by your health care provider.  Place a towel between your skin and the heat source.  Leave the heat on for 20-30 minutes.  Remove the heat if your skin turns bright red. This is especially important if you are unable to feel pain, heat, or cold. You may have a greater risk of getting burned.  If directed, apply ice to the injured area:  Put ice in a plastic bag.  Place a towel between  your skin and the bag.  Leave the ice on for 20 minutes, 2-3 times a day. General instructions   If the affected leg is one that you use for driving, ask your health care provider when it is safe to drive.  Use crutches, a cane, or a walker as told by your health care provider.  If one of your legs is shorter than the other, get fitted for a shoe insert.  Lose weight if you are overweight. How is this prevented?  Wear supportive footwear that is appropriate for your sport.  If you have hip pain, start  any new exercise or sport slowly.  Maintain physical fitness, including:  Strength.  Flexibility. Contact a health care provider if:  Your pain does not improve with 2-4 weeks. Get help right away if:  You develop severe pain.  You have a fever.  You develop increased redness over your hip.  You have a change in your bowel function or bladder function.  You cannot control the muscles in your feet. This information is not intended to replace advice given to you by your health care provider. Make sure you discuss any questions you have with your health care provider. Document Released: 07/25/2004 Document Revised: 02/21/2016 Document Reviewed: 06/02/2015 Elsevier Interactive Patient Education  2017 [employer].

## 2016-11-19 NOTE — Assessment & Plan Note (Signed)
Pain and weakness and loss of range of motion after jerking motion and fall Given to previous rotator cuff repairs in the shoulder, concern for rotator cuff injury Patient has recently been seen by [facility] for other issues, so we will refer her back to them She can continue to take her chronic pain management Return precautions discussed

## 2016-11-19 NOTE — Progress Notes (Signed)
   Subjective:   Michelle [name] is a 66 y.o. female with a history of HTN, asthma, GERD, gastroparesis, T2 DM, obesity, anxiety, depression, osteoporosis, rheumatoid arthritis, 2 rotator cuff repair surgeries on L shoulder here for same day appointment for  Chief Complaint  Patient presents with  . Shoulder Pain    Patient reports she was rushing to the bathroom 2 days ago at [employer] when she hit a swinging door with her left arm.  It swung back and hit her forearm which forced her L shoulder backward and she fell to the ground landing on her L shoulder and hip.  She has bruising over her L and R forearms, but none on her hip or shoulder.  She denies any prodrome to her fall including lightheadedness, dizziness, chest pain, palpitations. She also denies any loss of consciousness. She has had no other falls in the last 6 months. Her left shoulder continues to hurt since her fall. It seems to hurt over her scapula and into her deltoid. It hurts the most elected over her head or try to fix her hair. She's been taking her chronic pain management which helps some with the pain. She's had 2 previous rotator cuff repairs on the shoulder. She denies weakness, numbness, color change, fevers.  Review of Systems:  Per HPI.   Social History: former smoker  Objective:  BP 122/68   Pulse 93   Temp 98.1 F (36.7 C) (Oral)   Ht 5\' 4"  (1.626 m)   Wt 155 lb 12.8 oz (70.7 kg)   SpO2 96%   BMI 26.74 kg/m   Gen:  66 y.o. female in NAD HEENT: NCAT, MMM, anicteric sclerae CV: RRR, no MRG Resp: Non-labored, CTAB, no wheezes noted Ext: WWP, no edema MSK: L hip: TTP over greater trochanter, normal ROM, no pain with ROM L shoulder: Limited to 90 deg of abduction and 45 degrees of forward flexion. TTP diffusely over L shoulder. Strength 5/5 in elbow flexion, unable to complete strength testing in abduction and flexion due to pain. Strength 4/5 in internal rotation and 3/5 in external rotation.  Neuro:  Alert and oriented, speech normal     Assessment & Plan:     KIYONA MCNALL is a 66 y.o. female here for   Left shoulder pain Pain and weakness and loss of range of motion after jerking motion and fall Given to previous rotator cuff repairs in the shoulder, concern for rotator cuff injury Patient has recently been seen by [facility] for other issues, so we will refer her back to them She can continue to take her chronic pain management Return precautions discussed  Greater trochanteric bursitis Exam consistent with greater trochanteric bursitis of the left hip Offered corticosteroid injection, the patient declined Given handout on conservative treatment   Rhayne Chatwin, Dionne Bucy, MD MPH PGY-3,  [facility] 11/19/2016  9:04 AM

## 2016-11-20 DIAGNOSIS — M25552 Pain in left hip: Secondary | ICD-10-CM | POA: Diagnosis not present

## 2016-11-20 DIAGNOSIS — M542 Cervicalgia: Secondary | ICD-10-CM | POA: Diagnosis not present

## 2016-11-20 DIAGNOSIS — M25512 Pain in left shoulder: Secondary | ICD-10-CM | POA: Diagnosis not present

## 2016-11-26 ENCOUNTER — Encounter: Payer: Self-pay | Admitting: Family Medicine

## 2016-11-26 ENCOUNTER — Ambulatory Visit (INDEPENDENT_AMBULATORY_CARE_PROVIDER_SITE_OTHER): Payer: Medicare Other | Admitting: Family Medicine

## 2016-11-26 VITALS — BP 118/68 | HR 95 | Temp 98.8°F | Ht 64.0 in | Wt 156.2 lb

## 2016-11-26 DIAGNOSIS — L299 Pruritus, unspecified: Secondary | ICD-10-CM | POA: Diagnosis not present

## 2016-11-26 DIAGNOSIS — R21 Rash and other nonspecific skin eruption: Secondary | ICD-10-CM | POA: Diagnosis not present

## 2016-11-26 MED ORDER — TRIAMCINOLONE 0.1 % CREAM:EUCERIN CREAM 1:1: 1.0000 "application " | TOPICAL_CREAM | Freq: Two times a day (BID) | CUTANEOUS | 1 refills | Status: DC

## 2016-11-26 NOTE — Assessment & Plan Note (Signed)
Excoriated erythematous macules they could be consistent with bites On to that patient would have bites and her husband would not May be pickers dermatitis rather than a true rash or bites Continue to use daily Zyrtec May use Atarax at bedtime Given a prescription for triamcinolone and Eucerin cream to use twice daily Advised keeping nails clipped short, rubbing rather than scratching, and wearing gloves at bedtime to avoid scratching lesions worse check CMP and phosphorus and TSH to evaluate for other causes of itching Advised PCP follow-up

## 2016-11-26 NOTE — Progress Notes (Signed)
   Subjective:   Michelle [name] is a 66 y.o. female with a history of HTN, asthma, T2 DM, IBS, obesity, HLD, contact dermatitis, generalized itching here for same day appointment for  Chief Complaint  Patient presents with  . rash all over body     Patient has been itching for the past 4 weeks over multiple body parts. It seemed to start on her arms and chest and is now on her back and leg simple. She is tried triamcinolone ointment, Neosporin, Sarna lotion with minimal relief. She is taking hydroxyzine at bedtime which helps her sleep. No changes in soaps or detergents. She was taking meloxicam prescribed by her orthopedist for a few weeks, but this was after the itching and are restarted. She has noticed little red bumps in the areas that itch. When seen 2 weeks ago, it was thought that this was related to 2 wild rabbits that she was taking care of when the symptoms started. She is no longer taking care of the wild rabbits. She has checked her inside pets and they do not have fleas. Her husband who sleeps in the same bed does not have any bites or rash or itching. She hasn't noticed anything crawling in or on her skin   Review of Systems:  Per HPI.   Social History: former smoker  Objective:  BP 118/68   Pulse 95   Temp 98.8 F (37.1 C) (Oral)   Ht 5\' 4"  (1.626 m)   Wt 156 lb 3.2 oz (70.9 kg)   SpO2 96%   BMI 26.81 kg/m   Gen:  66 y.o. female in NAD HEENT: NCAT, MMM, anicteric sclerae, OP clear CV: RRR, no MRG Resp: Non-labored, CTAB, no wheezes noted Ext: WWP, no edema Skin: Multiple areas of raised erythematous lesions with central scabbing with evidence of scratching on back, chest, arms, legs MSK: No obvious deformities, gait intact Neuro: Alert and oriented, speech normal     Assessment & Plan:     Michelle [name] is a 66 y.o. female here for   Rash and nonspecific skin eruption Excoriated erythematous macules they could be consistent with bites On to that patient  would have bites and her husband would not May be pickers dermatitis rather than a true rash or bites Continue to use daily Zyrtec May use Atarax at bedtime Given a prescription for triamcinolone and Eucerin cream to use twice daily Advised keeping nails clipped short, rubbing rather than scratching, and wearing gloves at bedtime to avoid scratching lesions worse check CMP and phosphorus and TSH to evaluate for other causes of itching Advised PCP follow-up   [name], MD MPH PGY-3,  [facility] 11/26/2016  10:10 AM

## 2016-11-26 NOTE — Patient Instructions (Signed)
Rash A rash is a change in the color of the skin. A rash can also change the way your skin feels. There are many different conditions and factors that can cause a rash. Follow these instructions at home: Pay attention to any changes in your symptoms. Follow these instructions to help with your condition: Medicine Take or apply over-the-counter and prescription medicines only as told by your health care provider. These may include:  Corticosteroid cream.  Anti-itch lotions.  Oral antihistamines.  Skin Care  Apply cool compresses to the affected areas.  Try taking a bath with: ? Epsom salts. Follow the instructions on the packaging. You can get these at your local pharmacy or grocery store. ? Baking soda. Pour a small amount into the bath as told by your health care provider. ? Colloidal oatmeal. Follow the instructions on the packaging. You can get this at your local pharmacy or grocery store.  Try applying baking soda paste to your skin. Stir water into baking soda until it reaches a paste-like consistency.  Do not scratch or rub your skin.  Avoid covering the rash. Make sure the rash is exposed to air as much as possible. General instructions  Avoid hot showers or baths, which can make itching worse. A cold shower may help.  Avoid scented soaps, detergents, and perfumes. Use gentle soaps, detergents, perfumes, and other cosmetic products.  Avoid any substance that causes your rash. Keep a journal to help track what causes your rash. Write down: ? What you eat. ? What cosmetic products you use. ? What you drink. ? What you wear. This includes jewelry.  Keep all follow-up visits as told by your health care provider. This is important. Contact a health care provider if:  You sweat at night.  You lose weight.  You urinate more than normal.  You feel weak.  You vomit.  Your skin or the whites of your eyes look yellow (jaundice).  Your skin: ? Tingles. ? Is  numb.  Your rash: ? Does not go away after several days. ? Gets worse.  You are: ? Unusually thirsty. ? More tired than normal.  You have: ? New symptoms. ? Pain in your abdomen. ? A fever. ? Diarrhea. Get help right away if:  You develop a rash that covers all or most of your body. The rash may or may not be painful.  You develop blisters that: ? Are on top of the rash. ? Grow larger or grow together. ? Are painful. ? Are inside your nose or mouth.  You develop a rash that: ? Looks like purple pinprick-sized spots all over your body. ? Has a "bull's eye" or looks like a target. ? Is not related to sun exposure, is red and painful, and causes your skin to peel. This information is not intended to replace advice given to you by your health care provider. Make sure you discuss any questions you have with your health care provider. Document Released: 06/07/2002 Document Revised: 11/21/2015 Document Reviewed: 11/02/2014 Elsevier Interactive Patient Education  2017 Elsevier Inc.  

## 2016-11-27 LAB — CMP14+EGFR
ALBUMIN: 4.3 g/dL (ref 3.6–4.8)
ALK PHOS: 88 IU/L (ref 39–117)
ALT: 24 IU/L (ref 0–32)
AST: 24 IU/L (ref 0–40)
Albumin/Globulin Ratio: 2 (ref 1.2–2.2)
BUN / CREAT RATIO: 30 — AB (ref 12–28)
BUN: 18 mg/dL (ref 8–27)
Bilirubin Total: 0.3 mg/dL (ref 0.0–1.2)
CO2: 23 mmol/L (ref 18–29)
CREATININE: 0.6 mg/dL (ref 0.57–1.00)
Calcium: 9.1 mg/dL (ref 8.7–10.3)
Chloride: 102 mmol/L (ref 96–106)
GFR calc Af Amer: 110 mL/min/{1.73_m2} (ref >59)
GFR calc non Af Amer: 95 mL/min/{1.73_m2} (ref >59)
GLUCOSE: 141 mg/dL — AB (ref 65–99)
Globulin, Total: 2.2 g/dL (ref 1.5–4.5)
Potassium: 4.4 mmol/L (ref 3.5–5.2)
Sodium: 138 mmol/L (ref 134–144)
Total Protein: 6.5 g/dL (ref 6.0–8.5)

## 2016-11-27 LAB — PHOSPHORUS: PHOSPHORUS: 2.8 mg/dL (ref 2.5–4.5)

## 2016-11-27 LAB — TSH: TSH: 2.16 u[IU]/mL (ref 0.450–4.500)

## 2016-11-28 DIAGNOSIS — R918 Other nonspecific abnormal finding of lung field: Secondary | ICD-10-CM | POA: Diagnosis not present

## 2016-11-28 DIAGNOSIS — G4759 Other parasomnia: Secondary | ICD-10-CM | POA: Diagnosis not present

## 2016-11-28 DIAGNOSIS — J45909 Unspecified asthma, uncomplicated: Secondary | ICD-10-CM | POA: Diagnosis not present

## 2016-11-29 ENCOUNTER — Other Ambulatory Visit: Payer: Self-pay | Admitting: Family Medicine

## 2016-12-05 DIAGNOSIS — R0602 Shortness of breath: Secondary | ICD-10-CM | POA: Diagnosis not present

## 2016-12-05 DIAGNOSIS — G2581 Restless legs syndrome: Secondary | ICD-10-CM | POA: Diagnosis not present

## 2016-12-05 DIAGNOSIS — M8580 Other specified disorders of bone density and structure, unspecified site: Secondary | ICD-10-CM | POA: Diagnosis not present

## 2016-12-05 DIAGNOSIS — J45909 Unspecified asthma, uncomplicated: Secondary | ICD-10-CM | POA: Diagnosis not present

## 2016-12-05 DIAGNOSIS — Z882 Allergy status to sulfonamides status: Secondary | ICD-10-CM | POA: Diagnosis not present

## 2016-12-05 DIAGNOSIS — Z90710 Acquired absence of both cervix and uterus: Secondary | ICD-10-CM | POA: Diagnosis not present

## 2016-12-05 DIAGNOSIS — Z888 Allergy status to other drugs, medicaments and biological substances status: Secondary | ICD-10-CM | POA: Diagnosis not present

## 2016-12-05 DIAGNOSIS — Z79899 Other long term (current) drug therapy: Secondary | ICD-10-CM | POA: Diagnosis not present

## 2016-12-05 DIAGNOSIS — M1990 Unspecified osteoarthritis, unspecified site: Secondary | ICD-10-CM | POA: Diagnosis not present

## 2016-12-05 DIAGNOSIS — E785 Hyperlipidemia, unspecified: Secondary | ICD-10-CM | POA: Diagnosis not present

## 2016-12-05 DIAGNOSIS — M059 Rheumatoid arthritis with rheumatoid factor, unspecified: Secondary | ICD-10-CM | POA: Diagnosis not present

## 2016-12-05 DIAGNOSIS — Z7982 Long term (current) use of aspirin: Secondary | ICD-10-CM | POA: Diagnosis not present

## 2016-12-05 DIAGNOSIS — M47813 Spondylosis without myelopathy or radiculopathy, cervicothoracic region: Secondary | ICD-10-CM | POA: Diagnosis not present

## 2016-12-05 DIAGNOSIS — Z9049 Acquired absence of other specified parts of digestive tract: Secondary | ICD-10-CM | POA: Diagnosis not present

## 2016-12-05 DIAGNOSIS — J321 Chronic frontal sinusitis: Secondary | ICD-10-CM | POA: Diagnosis not present

## 2016-12-05 DIAGNOSIS — K589 Irritable bowel syndrome without diarrhea: Secondary | ICD-10-CM | POA: Diagnosis not present

## 2016-12-05 DIAGNOSIS — M654 Radial styloid tenosynovitis [de Quervain]: Secondary | ICD-10-CM | POA: Diagnosis not present

## 2016-12-05 DIAGNOSIS — E119 Type 2 diabetes mellitus without complications: Secondary | ICD-10-CM | POA: Diagnosis not present

## 2016-12-05 DIAGNOSIS — Z5181 Encounter for therapeutic drug level monitoring: Secondary | ICD-10-CM | POA: Diagnosis not present

## 2016-12-05 DIAGNOSIS — J320 Chronic maxillary sinusitis: Secondary | ICD-10-CM | POA: Diagnosis not present

## 2016-12-05 DIAGNOSIS — K219 Gastro-esophageal reflux disease without esophagitis: Secondary | ICD-10-CM | POA: Diagnosis not present

## 2016-12-05 DIAGNOSIS — J449 Chronic obstructive pulmonary disease, unspecified: Secondary | ICD-10-CM | POA: Diagnosis not present

## 2016-12-05 DIAGNOSIS — R918 Other nonspecific abnormal finding of lung field: Secondary | ICD-10-CM | POA: Diagnosis not present

## 2016-12-13 ENCOUNTER — Encounter: Payer: Self-pay | Admitting: Family Medicine

## 2016-12-13 ENCOUNTER — Ambulatory Visit (INDEPENDENT_AMBULATORY_CARE_PROVIDER_SITE_OTHER): Payer: Medicare Other | Admitting: Family Medicine

## 2016-12-13 VITALS — BP 128/68 | HR 94 | Temp 98.8°F | Ht 64.0 in | Wt 155.0 lb

## 2016-12-13 DIAGNOSIS — R21 Rash and other nonspecific skin eruption: Secondary | ICD-10-CM | POA: Diagnosis not present

## 2016-12-13 DIAGNOSIS — D649 Anemia, unspecified: Secondary | ICD-10-CM | POA: Diagnosis not present

## 2016-12-13 MED ORDER — CAPSAICIN 0.075 % EX CREA: 1.0000 "application " | TOPICAL_CREAM | Freq: Two times a day (BID) | CUTANEOUS | 0 refills | Status: DC

## 2016-12-13 MED ORDER — FLUCONAZOLE 150 MG PO TABS: 150.0000 mg | ORAL_TABLET | Freq: Once | ORAL | 0 refills | Status: DC

## 2016-12-13 MED ORDER — PREDNISONE 50 MG PO TABS: ORAL_TABLET | ORAL | 0 refills | Status: AC

## 2016-12-13 NOTE — Progress Notes (Signed)
    Subjective:  Michelle [name] is a 66 y.o. female who presents to the [facility] today with a chief complaint of rash.   HPI:  Rash Symptoms started 1-2 months ago. Was seen here in clinic about 2 weeks ago. Has tried several medications including triamcinalone with eucerin, neosporin, sarna lotion which has not helped. She has also tried hydoxyzine which helps. No new exposures. No medication changes. No one else in her family has similar symptoms. She has had flares of this several times in the past that will last for a few months then go away.  ROS: Per HPI  PMH: Smoking history reviewed.   Objective:  Physical Exam: BP 128/68   Pulse 94   Temp 98.8 F (37.1 C) (Oral)   Ht 5\' 4"  (1.626 m)   Wt 155 lb (70.3 kg)   SpO2 94%   BMI 26.61 kg/m   Gen: NAD, resting comfortably Skin: Several erythematous excoriated lesions on back, chest, arms and legs Neuro: grossly normal, moves all extremities Psych: Normal affect and thought content  Assessment/Plan:  Rash and nonspecific skin eruption Patient with history of neurodermatitis, which this probably is. Given her widespread disease at this point, will treat with a burst or prednisone. Patient has been on doxepin for this in the past which has helped - did not start this today. Will start capsicum cream. Return precautions reviewed. If not improving would consider re-referral to dermatology and/or intralesional steroid injections.   Algis Greenhouse. Jerline Pain, [facility] Resident PGY-3 12/13/2016 12:08 PM

## 2016-12-13 NOTE — Assessment & Plan Note (Signed)
Patient with history of neurodermatitis, which this probably is. Given her widespread disease at this point, will treat with a burst or prednisone. Patient has been on doxepin for this in the past which has helped - did not start this today. Will start capsicum cream. Return precautions reviewed. If not improving would consider re-referral to dermatology and/or intralesional steroid injections.

## 2016-12-13 NOTE — Patient Instructions (Signed)
Start the prednisone.  Use the capsicum as needed.  Come back to see me in 2 weeks.  Take care,  Dr Jerline Pain

## 2016-12-14 LAB — IRON AND TIBC
Iron Saturation: 9 % — CL (ref 15–55)
Iron: 44 ug/dL (ref 27–139)
TIBC: 471 ug/dL — AB (ref 250–450)
UIBC: 427 ug/dL — AB (ref 118–369)

## 2016-12-14 LAB — FERRITIN: Ferritin: 14 ng/mL — ABNORMAL LOW (ref 15–150)

## 2016-12-16 ENCOUNTER — Other Ambulatory Visit: Payer: Self-pay | Admitting: Family Medicine

## 2016-12-16 MED ORDER — FLUCONAZOLE 150 MG PO TABS: 150.0000 mg | ORAL_TABLET | Freq: Once | ORAL | 1 refills | Status: AC

## 2016-12-16 NOTE — Telephone Encounter (Signed)
She needs more pills for yeast infection. The one pill didn't work,.  [address]

## 2016-12-18 ENCOUNTER — Telehealth: Payer: Self-pay | Admitting: Family Medicine

## 2016-12-18 MED ORDER — FERROUS GLUCONATE 324 (38 FE) MG PO TABS: 324.0000 mg | ORAL_TABLET | Freq: Every day | ORAL | 3 refills | Status: DC

## 2016-12-18 NOTE — Telephone Encounter (Signed)
Lab results reviewed. Low ferritin and sat% with elevated TIBC. Discussed iron supplementation with patient. Will start 1 tablet ferrous gluconate daily or every other day. Patient will follow up next week.  Algis Greenhouse. Jerline Pain, [place] Resident PGY-3 12/18/2016 10:44 AM

## 2016-12-19 ENCOUNTER — Encounter: Payer: Self-pay | Admitting: Family Medicine

## 2016-12-24 DIAGNOSIS — M059 Rheumatoid arthritis with rheumatoid factor, unspecified: Secondary | ICD-10-CM | POA: Diagnosis not present

## 2016-12-24 DIAGNOSIS — Z79899 Other long term (current) drug therapy: Secondary | ICD-10-CM | POA: Diagnosis not present

## 2016-12-27 ENCOUNTER — Encounter: Payer: Self-pay | Admitting: Family Medicine

## 2016-12-27 ENCOUNTER — Telehealth: Payer: Self-pay | Admitting: *Deleted

## 2016-12-27 ENCOUNTER — Ambulatory Visit (INDEPENDENT_AMBULATORY_CARE_PROVIDER_SITE_OTHER): Payer: Medicare Other | Admitting: Family Medicine

## 2016-12-27 VITALS — BP 112/70 | HR 96 | Temp 98.4°F | Ht 64.0 in | Wt 155.0 lb

## 2016-12-27 DIAGNOSIS — G8929 Other chronic pain: Secondary | ICD-10-CM

## 2016-12-27 DIAGNOSIS — F419 Anxiety disorder, unspecified: Secondary | ICD-10-CM | POA: Diagnosis not present

## 2016-12-27 DIAGNOSIS — D509 Iron deficiency anemia, unspecified: Secondary | ICD-10-CM

## 2016-12-27 DIAGNOSIS — F329 Major depressive disorder, single episode, unspecified: Secondary | ICD-10-CM

## 2016-12-27 DIAGNOSIS — R21 Rash and other nonspecific skin eruption: Secondary | ICD-10-CM

## 2016-12-27 DIAGNOSIS — E119 Type 2 diabetes mellitus without complications: Secondary | ICD-10-CM

## 2016-12-27 DIAGNOSIS — E118 Type 2 diabetes mellitus with unspecified complications: Secondary | ICD-10-CM | POA: Diagnosis not present

## 2016-12-27 DIAGNOSIS — D500 Iron deficiency anemia secondary to blood loss (chronic): Secondary | ICD-10-CM | POA: Insufficient documentation

## 2016-12-27 DIAGNOSIS — I10 Essential (primary) hypertension: Secondary | ICD-10-CM

## 2016-12-27 LAB — POCT GLYCOSYLATED HEMOGLOBIN (HGB A1C): Hemoglobin A1C: 7.9

## 2016-12-27 MED ORDER — OXYCODONE-ACETAMINOPHEN 10-325 MG PO TABS: 1.0000 | ORAL_TABLET | Freq: Four times a day (QID) | ORAL | 0 refills | Status: DC | PRN

## 2016-12-27 MED ORDER — CANAGLIFLOZIN 100 MG PO TABS: 100.0000 mg | ORAL_TABLET | Freq: Every day | ORAL | 3 refills | Status: DC

## 2016-12-27 MED ORDER — CLONAZEPAM 1 MG PO TABS: 1.0000 mg | ORAL_TABLET | Freq: Two times a day (BID) | ORAL | 5 refills | Status: DC | PRN

## 2016-12-27 NOTE — Telephone Encounter (Signed)
PA faxed to [employer] for review. Review process could take 24-72 hours. Martin, Tamika L, RN  

## 2016-12-27 NOTE — Assessment & Plan Note (Signed)
Pain stable. Refilled percocet today. Database reviewed w/o red flags.

## 2016-12-27 NOTE — Telephone Encounter (Signed)
Form completed and given to [place].  Algis Greenhouse. Jerline Pain, [place] Resident PGY-3 12/27/2016 10:28 AM

## 2016-12-27 NOTE — Patient Instructions (Signed)
Your a1c was 7.9. We will start invokana. Let us know if you have any side effects.  No other changes today.  Come back in 3 months to meet your new doctor.  Take care,  Dr Jerline Pain

## 2016-12-27 NOTE — Progress Notes (Signed)
° ° °  Subjective:  Michelle [name] is a 66 y.o. female who presents to the [facility] today with a chief complaint of T2DM.   HPI:  T2DM Status: Worsening  Recent increase in A1C from 7.6 to 7.9. Most likely from recently on course of prednisone. Currently on metformin 1000mg  bid. Pt is not taking Rx tradjenta 5mg  daily. Pt reports examining feet daily and retaining normal feeling throughout.   Fastings: 113  Highest: 380s while on prednisone Lowest: 120s Polyuria/polydipsia:  Hypoglycemic symptoms. None.   HTN Chronic: Stable Currently on losartan. No reported side effects.   Rash Improved. Using Triamcinolone ceam.   Chronic Pain Stable since her last visit. Has chronic pain secondary to RA. Recently finished remicaid infusion last week. Currently on percocet which helps with her pain and functional ability. No constipation. Regular bowel movements.    Anxiety Condition: Stable Currently on sertraline 50 po qd and Klonapin 1 mg bid prn. Reports No drowsiness.  Iron Deficiency Anemia Tx w/ iron pills cause stomach pain. Pain is better by taking iron every other day. Restless leg syndrome improved with iron.   ROS: Per HPI.   PMH: Smoking history reviewed.   Objective:  Physical Exam: BP 112/70    Pulse 96    Temp 98.4 F (36.9 C) (Oral)    Ht 5\' 4"  (1.626 m)    Wt 155 lb (70.3 kg)    SpO2 99%    BMI 26.61 kg/m   Gen: NAD, resting comfortably CV: RRR with no murmurs appreciated Pulm: NWOB, CTAB with no crackles, wheezes, or rhonchi Foot: Normal fine touch sensation throughout. DP: 2+. Skin d/i w/ no lesions noted.  Psych: Normal affect and thought content  Results for orders placed or performed in visit on 12/27/16 (from the past 72 hour(s))  HgB A1c     Status: None   Collection Time: 12/27/16  8:49 AM  Result Value Ref Range   Hemoglobin A1C 7.9      Assessment/Plan:  Diabetes mellitus without complication (HCC) T5H increased to 7.9. Cont metformin. Add Invokana.  F/u in 3 mo.   Essential hypertension Stable. Cont losartan.   Rash and nonspecific skin eruption Improved. Cont using triamcinolone as needed.   Encounter for chronic pain management Pain stable. Refilled percocet today. Database reviewed w/o red flags.   Anxiety and depression Stable. Refilled Klonopin/Zoloft. F/u in 3 mo w/ new PCP.   Iron deficiency anemia Incidental finding on lab work. Currently on ferris sulfate every other day. Pt tolerating well. Consider recheck iron panel at next visit.   Marny Lowenstein, MD, [place] - PGY1 12/27/2016 9:51 AM

## 2016-12-27 NOTE — Assessment & Plan Note (Signed)
Stable. Refilled Klonopin/Zoloft. F/u in 3 mo w/ new PCP.

## 2016-12-27 NOTE — Assessment & Plan Note (Signed)
Stable. Cont losartan.

## 2016-12-27 NOTE — Assessment & Plan Note (Signed)
Incidental finding on lab work. Currently on ferris sulfate every other day. Pt tolerating well. Consider recheck iron panel at next visit.

## 2016-12-27 NOTE — Telephone Encounter (Signed)
Prior Authorization received from [employer] for Invokana 100 mg. Formulary and PA form placed in provider box for completion. Derl Barrow, RN

## 2016-12-27 NOTE — Assessment & Plan Note (Signed)
A1C increased to 7.9. Cont metformin. Add Invokana. F/u in 3 mo.

## 2016-12-27 NOTE — Progress Notes (Signed)
Lab Results  Component Value Date   HGBA1C 7.9 12/27/2016   HGBA1C 7.6 10/04/2016   HGBA1C 7.2 07/03/2016

## 2016-12-27 NOTE — Assessment & Plan Note (Signed)
Improved. Cont using triamcinolone as needed.

## 2016-12-30 NOTE — Telephone Encounter (Signed)
PA for Invokana was denied via [employer].  Patient much have tried and failed [place] and [place] before Anastasio Auerbach will be approved.  Denial placed in provider box for review.  Michelle Barrow, RN

## 2017-01-10 ENCOUNTER — Other Ambulatory Visit (HOSPITAL_BASED_OUTPATIENT_CLINIC_OR_DEPARTMENT_OTHER): Payer: Self-pay

## 2017-01-10 DIAGNOSIS — G479 Sleep disorder, unspecified: Secondary | ICD-10-CM

## 2017-01-10 DIAGNOSIS — R0683 Snoring: Secondary | ICD-10-CM

## 2017-01-16 ENCOUNTER — Ambulatory Visit (HOSPITAL_BASED_OUTPATIENT_CLINIC_OR_DEPARTMENT_OTHER): Payer: Medicare Other | Attending: Internal Medicine | Admitting: Internal Medicine

## 2017-01-16 DIAGNOSIS — R0683 Snoring: Secondary | ICD-10-CM

## 2017-01-16 DIAGNOSIS — G479 Sleep disorder, unspecified: Secondary | ICD-10-CM | POA: Diagnosis not present

## 2017-01-19 DIAGNOSIS — R0683 Snoring: Secondary | ICD-10-CM | POA: Diagnosis not present

## 2017-01-19 NOTE — Procedures (Signed)
  Patient Name: Michelle [name], [place] Date: 01/16/2017 Gender: Female D.O.B: 24-Mar-1951 Age (years): 51 Referring Provider: Gregery Na Mock MD Height (inches): 63 Interpreting Physician: Baird Lyons MD, ABSM Weight (lbs): 155 RPSGT: Earney [place] BMI: 27 MRN: [id number] Neck Size: 13.50 CLINICAL INFORMATION Sleep Study Type: NPSG   Indication for sleep study: Snoring  Epworth Sleepiness Score: 6  SLEEP STUDY TECHNIQUE As per the AASM Manual for the Scoring of Sleep and Associated Events v2.3 (April 2016) with a hypopnea requiring 4% desaturations.  The channels recorded and monitored were frontal, central and occipital EEG, electrooculogram (EOG), submentalis EMG (chin), nasal and oral airflow, thoracic and abdominal wall motion, anterior tibialis EMG, snore microphone, electrocardiogram, and pulse oximetry.  MEDICATIONS Medications self-administered by patient taken the night of the study : GABAPENTIN, CETIRIZINE, METFORMIN, ROSUVASTATIN, SERTRALINE  SLEEP ARCHITECTURE The study was initiated at 9:33:28 PM and ended at 4:31:54 AM.  Sleep onset time was 86.2 minutes and the sleep efficiency was 76.5%. The total sleep time was 320.3 minutes.  Stage REM latency was 146.5 minutes.  The patient spent 0.62% of the night in stage N1 sleep, 88.29% in stage N2 sleep, 0.00% in stage N3 and 11.08% in REM.  Alpha intrusion was absent.  Supine sleep was 31.85%.  RESPIRATORY PARAMETERS The overall apnea/hypopnea index (AHI) was 1.9 per hour. There were 0 total apneas, including 0 obstructive, 0 central and 0 mixed apneas. There were 10 hypopneas and 1 RERAs.  The AHI during Stage REM sleep was 1.7 per hour.  AHI while supine was 4.7 per hour.  The mean oxygen saturation was 93.26%. The minimum SpO2 during sleep was 86.00%.  Soft snoring was noted during this study.  CARDIAC DATA The 2 lead EKG demonstrated sinus rhythm. The mean heart rate was 0.15 beats per minute.  Other EKG findings include: PVCs.  LEG MOVEMENT DATA The total PLMS were 24 with a resulting PLMS index of 4.50. Associated arousal with leg movement index was 0.4 .  IMPRESSIONS - No significant obstructive sleep apnea occurred during this study (AHI = 1.9/h). - No significant central sleep apnea occurred during this study (CAI = 0.0/h). - Oxygen desaturation was noted during this study (Min O2 = 86.00%, Mean 93.2%). Less than 1 minute was recorded with oxygen saturation </= 88%. - The patient snored with Soft snoring volume. - EKG findings include PVCs. - Clinically significant periodic limb movements did not occur during sleep. No significant associated arousals.  DIAGNOSIS - Normal study  RECOMMENDATIONS - Be careful with alcohol, sedatives and other CNS depressants that may worsen sleep apnea and disrupt normal sleep architecture. - Sleep hygiene should be reviewed to assess factors that may improve sleep quality. - Weight management and regular exercise should be initiated or continued if appropriate.  [Electronically signed] 01/19/2017 01:28 PM  Baird Lyons MD, [place], American Board of Sleep Medicine   NPI: [id number]  [place], American Board of Sleep Medicine  ELECTRONICALLY SIGNED ON:  01/19/2017, 1:25 PM [place] PH: [phone]   FX: [phone] [place]

## 2017-01-23 DIAGNOSIS — M25562 Pain in left knee: Secondary | ICD-10-CM | POA: Diagnosis not present

## 2017-01-23 DIAGNOSIS — M25561 Pain in right knee: Secondary | ICD-10-CM | POA: Diagnosis not present

## 2017-01-23 DIAGNOSIS — Z7982 Long term (current) use of aspirin: Secondary | ICD-10-CM | POA: Diagnosis not present

## 2017-01-23 DIAGNOSIS — Z882 Allergy status to sulfonamides status: Secondary | ICD-10-CM | POA: Diagnosis not present

## 2017-01-23 DIAGNOSIS — I10 Essential (primary) hypertension: Secondary | ICD-10-CM | POA: Diagnosis not present

## 2017-01-23 DIAGNOSIS — G8929 Other chronic pain: Secondary | ICD-10-CM | POA: Diagnosis not present

## 2017-01-23 DIAGNOSIS — F329 Major depressive disorder, single episode, unspecified: Secondary | ICD-10-CM | POA: Diagnosis not present

## 2017-01-23 DIAGNOSIS — M153 Secondary multiple arthritis: Secondary | ICD-10-CM | POA: Diagnosis not present

## 2017-01-23 DIAGNOSIS — M069 Rheumatoid arthritis, unspecified: Secondary | ICD-10-CM | POA: Diagnosis not present

## 2017-01-23 DIAGNOSIS — M05861 Other rheumatoid arthritis with rheumatoid factor of right knee: Secondary | ICD-10-CM | POA: Diagnosis not present

## 2017-01-23 DIAGNOSIS — K219 Gastro-esophageal reflux disease without esophagitis: Secondary | ICD-10-CM | POA: Diagnosis not present

## 2017-01-23 DIAGNOSIS — M059 Rheumatoid arthritis with rheumatoid factor, unspecified: Secondary | ICD-10-CM | POA: Diagnosis not present

## 2017-01-23 DIAGNOSIS — M1990 Unspecified osteoarthritis, unspecified site: Secondary | ICD-10-CM | POA: Diagnosis not present

## 2017-01-23 DIAGNOSIS — Z87891 Personal history of nicotine dependence: Secondary | ICD-10-CM | POA: Diagnosis not present

## 2017-01-23 DIAGNOSIS — E119 Type 2 diabetes mellitus without complications: Secondary | ICD-10-CM | POA: Diagnosis not present

## 2017-01-23 DIAGNOSIS — E785 Hyperlipidemia, unspecified: Secondary | ICD-10-CM | POA: Diagnosis not present

## 2017-01-26 DIAGNOSIS — M93961 Osteochondropathy, unspecified, right lower leg: Secondary | ICD-10-CM | POA: Diagnosis not present

## 2017-01-26 DIAGNOSIS — M7121 Synovial cyst of popliteal space [Baker], right knee: Secondary | ICD-10-CM | POA: Diagnosis not present

## 2017-01-26 DIAGNOSIS — S83411A Sprain of medial collateral ligament of right knee, initial encounter: Secondary | ICD-10-CM | POA: Diagnosis not present

## 2017-01-26 DIAGNOSIS — M069 Rheumatoid arthritis, unspecified: Secondary | ICD-10-CM | POA: Diagnosis not present

## 2017-01-26 DIAGNOSIS — S83241A Other tear of medial meniscus, current injury, right knee, initial encounter: Secondary | ICD-10-CM | POA: Diagnosis not present

## 2017-02-04 DIAGNOSIS — M65331 Trigger finger, right middle finger: Secondary | ICD-10-CM | POA: Diagnosis not present

## 2017-02-07 DIAGNOSIS — M65331 Trigger finger, right middle finger: Secondary | ICD-10-CM | POA: Diagnosis not present

## 2017-02-18 DIAGNOSIS — M059 Rheumatoid arthritis with rheumatoid factor, unspecified: Secondary | ICD-10-CM | POA: Diagnosis not present

## 2017-02-20 DIAGNOSIS — Z9889 Other specified postprocedural states: Secondary | ICD-10-CM | POA: Diagnosis not present

## 2017-02-26 DIAGNOSIS — J45909 Unspecified asthma, uncomplicated: Secondary | ICD-10-CM | POA: Diagnosis not present

## 2017-03-26 ENCOUNTER — Ambulatory Visit (INDEPENDENT_AMBULATORY_CARE_PROVIDER_SITE_OTHER): Payer: Medicare Other | Admitting: Student in an Organized Health Care Education/Training Program

## 2017-03-26 ENCOUNTER — Encounter: Payer: Self-pay | Admitting: Student in an Organized Health Care Education/Training Program

## 2017-03-26 VITALS — BP 140/72 | HR 83 | Temp 98.6°F | Ht 63.0 in | Wt 158.6 lb

## 2017-03-26 DIAGNOSIS — F419 Anxiety disorder, unspecified: Secondary | ICD-10-CM | POA: Diagnosis not present

## 2017-03-26 DIAGNOSIS — G894 Chronic pain syndrome: Secondary | ICD-10-CM

## 2017-03-26 DIAGNOSIS — G8929 Other chronic pain: Secondary | ICD-10-CM

## 2017-03-26 DIAGNOSIS — D509 Iron deficiency anemia, unspecified: Secondary | ICD-10-CM | POA: Diagnosis not present

## 2017-03-26 DIAGNOSIS — E119 Type 2 diabetes mellitus without complications: Secondary | ICD-10-CM

## 2017-03-26 DIAGNOSIS — I10 Essential (primary) hypertension: Secondary | ICD-10-CM

## 2017-03-26 DIAGNOSIS — F329 Major depressive disorder, single episode, unspecified: Secondary | ICD-10-CM

## 2017-03-26 DIAGNOSIS — J45909 Unspecified asthma, uncomplicated: Secondary | ICD-10-CM

## 2017-03-26 DIAGNOSIS — Z23 Encounter for immunization: Secondary | ICD-10-CM

## 2017-03-26 LAB — POCT GLYCOSYLATED HEMOGLOBIN (HGB A1C): HEMOGLOBIN A1C: 8.1

## 2017-03-26 MED ORDER — GLUCOSE BLOOD VI STRP: ORAL_STRIP | 2 refills | Status: DC

## 2017-03-26 MED ORDER — ALBUTEROL SULFATE HFA 108 (90 BASE) MCG/ACT IN AERS: INHALATION_SPRAY | RESPIRATORY_TRACT | 1 refills | Status: DC

## 2017-03-26 MED ORDER — LOSARTAN POTASSIUM 25 MG PO TABS: 50.0000 mg | ORAL_TABLET | Freq: Every day | ORAL | Status: DC

## 2017-03-26 MED ORDER — OXYCODONE-ACETAMINOPHEN 10-325 MG PO TABS: 1.0000 | ORAL_TABLET | Freq: Four times a day (QID) | ORAL | 0 refills | Status: DC | PRN

## 2017-03-26 MED ORDER — METFORMIN HCL 1000 MG PO TABS: 1000.0000 mg | ORAL_TABLET | Freq: Two times a day (BID) | ORAL | 3 refills | Status: DC

## 2017-03-26 MED ORDER — LOSARTAN POTASSIUM 25 MG PO TABS: 25.0000 mg | ORAL_TABLET | Freq: Every day | ORAL | 0 refills | Status: DC

## 2017-03-26 NOTE — Assessment & Plan Note (Signed)
-   Discussed using Percocet as a when necessary medication, not standing every 6 hours. Patient understands this is in agreement/ - Percocet refilled for 3 months, with notes to pharmacy to refill one month apart

## 2017-03-26 NOTE — Assessment & Plan Note (Signed)
Currently controlled. Patient has been taking 25 mg of losartan. We'll plan to continue at this dose. - Losartan 25 mg filled today

## 2017-03-26 NOTE — Assessment & Plan Note (Signed)
Currently controlled. Plan to continue Zoloft, will not plan on refilling Klonipin since she has tapered off of this.

## 2017-03-26 NOTE — Assessment & Plan Note (Signed)
Chronic, patient does exhibit signs and symptoms of iron deficiency including peak, and fatigue. Has not tolerated oral iron pills. - Check CBC and anemia panel at today's visit - return precautions advised - likely will need to send her for IV iron supplementation - close follow up in next 2 weeks - provided list of iron-rich food

## 2017-03-26 NOTE — Patient Instructions (Addendum)
Iron and diet - Although dietary iron is important in preventing iron deficiency, people with iron deficiency anemia need more iron than they can consume through their diet alone. In a 2000 calorie diet, there is only about 10 mg of elemental iron (compared to 65 mg in one 325 mg ferrous sulfate tablet). Therefore, increasing dietary iron alone is not usually recommended as a treatment for iron deficiency anemia, although it may be recommended in combination with iron therapy. Dietary sources of iron are found in meat, grains, fruits, and vegetables (table 1). For people who do not eat meat, good plant sources of iron include whole or enriched breads or grains, iron-fortified cereals, legumes, green leafy vegetables, dried fruits, soy products, blackstrap molasses, bulgur, and wheat germ. To maximize absorption, iron-rich foods should not be consumed with coffee or tea. Taking vitamin C or drinking orange juice with high iron foods can further enhance absorption.  Dietary sources of iron  Food Approximate measure Iron (mg)  High iron sources  Cream of Wheat (quick or instant)* 1/2 cup 7.8  Kidney, beef 2 oz (60 g) 5.3  Liver, beef 2 oz (60 g) 5.8  Liver, calf 2 oz (60 g) 9.0  Liver, chicken 2 oz (60 g) 6.0  Liverwurst 2 oz (60 g) 3.6  Prune juice 1/2 cup 5.1  Spinach 1/2 cup 3.2  Moderate iron sources  All-Bran cereal 1/2 cup 2.9  Almonds, dried unblanched 1/2 cup 3.0  Dried beans and peas  Baked beans, no pork 1/4 cup 1.5  Blackeye peas, cooked 1/4 cup 0.8  Chick peas, dry 1/4 cup 3.5  Great northern beans, cooked 1/4 cup 1.3  Green peas, cooked 1/4 cup 1.4  Lentils, dry 1/4 cup 3.4  Lima beans, cooked 1/4 cup 1.3  Navy beans, cooked 1/4 cup 1.3  Red beans, dry 1/4 cup 3.5  Soybeans, cooked 1/4 cup 1.4  White beans, dry 1/4 cup 3.9  Beef, cooked 2 oz (60 g) 2-3?  Ham, cooked 2 oz (60 g) 1.3  Lamb, cooked 2 oz (60 g) 1.9  Peaches, dried 1/4 cup 2.4  Peanuts, roasted without  skins 3 1/2 oz (100 g) 3.2  Pork, cooked 2 oz (60 g) 2-3?  Prunes, dried 2 large 1.1  Scallops 2 oz (60 g) 1.6  [place], cooked 2 oz (60 g) 1.7  Approximate iron content of children's favorite foods  Hamburger, small 1 3.0  Large 1 5.2  Big Mac 1 4.3  Quarter Pounder 1 5.1  Spaghetti with meatballs 1 cup 3.3  Frankfurter and beans 1 cup 4.8  Pork and beans 1 cup 5.9  Raisins 5/8 cup 3.5  Cereals, fortified 1 serving 4.5-17.8  Nuts 1 cup 5.0-7.0  Seeds, sunflower 3 1/2 oz (100 g) 7.1  [place] con carne 1 cup 3.6  Beef burrito or tostado 1 medium 3.4-4.6  Cheese pizza 2 slices 3.0  Cheese pizza with beef 2 slices 4.8  White bread 1 piece 0.7  * Or other fortified cereals which contain 10 mg of iron per ounce or 100 percent RDA per serving.  As organ meats are generally high in cholesterol, these iron-rich foods should be eaten in moderation. ? Depending on cut, the greatest amounts of iron are generally found in the chuck, flank, and bottom round cuts of beef. ? Depending on cut, the greatest amounts of iron are generally found in the loin, sirloin, tenderloin, and picnic shoulder cuts of pork.  Raisins, nuts, and seeds are not generally  recommended for children under age three because of risk of choking.  Data from: Loa Socks, Linden Dolin), Nutrition in Pediatrics, 2nd ed, [place], [place].  Graphic [phone] Version 6.0

## 2017-03-26 NOTE — Assessment & Plan Note (Signed)
Patient's A1c has worsened today at 8.1. Asked patient to schedule follow-up to discuss diabetes further. She agrees to follow-up in the next 2 weeks.

## 2017-03-26 NOTE — Progress Notes (Signed)
CC: IDA  HPI: Michelle [name] is a 66 y.o. female with PMH significant for IDA, chronic pain, anxiety, HTN, diabetes who presents to [facility] today for follow up.  Iron Deficiency Anemia Patient was seen by her previous PCP for iron deficiency anemia. In June of this year, her iron was 44, her eye and saturation was low at 9. TIBC was appropriately elevated. She states that she is completely unable to tolerate oral iron. She states it makes her abdomen very crampy and she will be doubled over with pain. Of note, patient does endorse PICA, stating that she eats a full cup of ice every night. She also endorses easy bruising recently. She denies chest pains, denies palpitations, denies dizziness or blurry vision. She does endorse frequent fatigue every day.  Chronic Pain Management Follow up Patient follows with a rheumatologist for infusion therapy however her chronic pain narcotics managed by our office. She reports taking Percocet every 6 hours as needed for pain, and reports that she takes it almost every 6 hours. This is the most important thing to her at today's visit. She reports about taking it every 6 hours her rheumatory arthritis pain is not manageable. Patient states that it they have given her new PCP that would not prescribe her Percocet they "may as well shoot her."    Anxiety She does report that she is gone down to almost no Klonopin and states that she will not require any further refills of Klonopin for anxiety. Anxiety currently managed on Zoloft 50 mg daily. Advised patient to follow-up for depression and anxiety if she notices a change or worsening in her symptoms.  Medication refills/HTN  Patient asks if she can come off of her blood pressure medication, she is currently on losartan, has been taking half a pill so 25 mg daily. Blood pressure is controlled today, however we discussed how ARBs have also been shown to have kidney protection in patients of hypertension and diabetes.  Patient was unaware of this protective factor. She is willing to continue the losartan for renal protection.  Overdue health maintenance : Patient states that her flu vaccine and she believes her Prevnar 51 were completed at [place] of Symptoms:  See HPI for ROS.   CC, SH/smoking status, and VS noted.  Objective: BP 140/72   Pulse 83   Temp 98.6 F (37 C) (Oral)   Ht 5\' 3"  (1.6 m)   Wt 158 lb 9.6 oz (71.9 kg)   SpO2 95%   BMI 28.09 kg/m  GEN: NAD, alert, cooperative, and pleasant. EYE: no conjunctival pallor, pupils equally round and reactive to light ENMT: MMM, tongue is normal in appearance RESPIRATORY: clear to auscultation bilaterally with no wheezes, rhonchi or rales, good effort CV: RRR, no m/r/g, no peripheral edema GI: soft, non-tender, non-distended, no hepatosplenomegaly SKIN: warm and dry, +two bruises noted on upper extremities NEURO: II-XII grossly intact  Flu Vaccine: Patient reports she received this vaccine at [facility]  Assessment and plan:  Diabetes mellitus without complication ([place]) Patient's A1c has worsened today at 8.1. Asked patient to schedule follow-up to discuss diabetes further. She agrees to follow-up in the next 2 weeks.  Iron deficiency anemia Chronic, patient does exhibit signs and symptoms of iron deficiency including peak, and fatigue. Has not tolerated oral iron pills. - Check CBC and anemia panel at today's visit - return precautions advised - likely will need to send her for IV iron supplementation - close follow up in next  2 weeks - provided list of iron-rich food  Anxiety and depression Currently controlled. Plan to continue Zoloft, will not plan on refilling Klonipin since she has tapered off of this.  Essential hypertension Currently controlled. Patient has been taking 25 mg of losartan. We'll plan to continue at this dose. - Losartan 25 mg filled today   Encounter for chronic pain management - Discussed using Percocet  as a when necessary medication, not standing every 6 hours. Patient understands this is in agreement/ - Percocet refilled for 3 months, with notes to pharmacy to refill one month apart  Refilled requested meds.  Orders Placed This Encounter  Procedures  . Flu Vaccine QUAD 36+ mos IM  . Anemia panel  . CBC  . HgB A1c    Meds ordered this encounter  Medications  . metFORMIN (GLUCOPHAGE) 1000 MG tablet    Sig: Take 1 tablet (1,000 mg total) by mouth 2 (two) times daily with a meal.    Dispense:  180 tablet    Refill:  3  . albuterol (VENTOLIN HFA) 108 (90 Base) MCG/ACT inhaler    Sig: INHALE TWO PUFFS BY MOUTH EVERY 4 HOURS AS NEEDED FOR WHEEZING FOR SHORTNESS OF BREATH    Dispense:  6 Inhaler    Refill:  1  . losartan (COZAAR) 25 MG tablet    Sig: Take 1 tablet (25 mg total) by mouth daily.    Dispense:  90 tablet    Refill:  0  . glucose blood (ACCU-CHEK AVIVA PLUS) test strip    Sig: Use to test blood sugar four times daily as needed. ICD-10 code: E11.9    Dispense:  400 each    Refill:  2    Refilled Oxycodone and handed 3 scripts, postdated     Everrett Coombe, MD,MS,  PGY2 03/26/2017 3:39 PM

## 2017-03-27 LAB — ANEMIA PANEL
FOLATE, RBC: 1305 ng/mL (ref >498)
Ferritin: 19 ng/mL (ref 15–150)
Folate, Hemolysate: 475 ng/mL
HEMATOCRIT: 36.4 % (ref 34.0–46.6)
IRON SATURATION: 11 % — AB (ref 15–55)
Iron: 45 ug/dL (ref 27–139)
RETIC CT PCT: 2.3 % (ref 0.6–2.6)
TIBC: 394 ug/dL (ref 250–450)
UIBC: 349 ug/dL (ref 118–369)
Vitamin B-12: 260 pg/mL (ref 232–1245)

## 2017-03-27 LAB — CBC
Hemoglobin: 11.8 g/dL (ref 11.1–15.9)
MCH: 29 pg (ref 26.6–33.0)
MCHC: 32.4 g/dL (ref 31.5–35.7)
MCV: 89 fL (ref 79–97)
Platelets: 231 10*3/uL (ref 150–379)
RBC: 4.07 x10E6/uL (ref 3.77–5.28)
RDW: 15.1 % (ref 12.3–15.4)
WBC: 5.5 10*3/uL (ref 3.4–10.8)

## 2017-03-28 ENCOUNTER — Telehealth: Payer: Self-pay | Admitting: *Deleted

## 2017-03-28 ENCOUNTER — Other Ambulatory Visit: Payer: Self-pay | Admitting: Student in an Organized Health Care Education/Training Program

## 2017-03-28 DIAGNOSIS — D509 Iron deficiency anemia, unspecified: Secondary | ICD-10-CM

## 2017-03-28 MED ORDER — FERUMOXYTOL INJECTION 510 MG/17 ML: 510.0000 mg | Freq: Once | INTRAVENOUS | Status: AC

## 2017-03-28 NOTE — Telephone Encounter (Signed)
Called and let patient know that her iron studies returned and have actually improved. Hgb is stable. However, because she is having significant symptoms of fatigue and PICA, and because she cannot tolerate oral iron supplementation, I think it is reasonable to do a round of ferreheme for the patient. - ferreheme order placed in epic - patient to call short stay at [phone] and schedule an appointment (earliest next Friday - this is ok, it is not an emergency) - patient endorses understanding and has no further questions  Everrett Coombe, MD PGY-2 [facility]

## 2017-03-28 NOTE — Telephone Encounter (Signed)
Patient called requesting lab results from last office visit. Pt also reported that her legs and head hurt. Please give her a call on her cell. Number on file is correct. Derl Barrow, RN

## 2017-04-03 ENCOUNTER — Telehealth: Payer: Self-pay | Admitting: *Deleted

## 2017-04-03 NOTE — Telephone Encounter (Signed)
Michelle [name] from [facility] left message on nurse line needing a fairchem order placed in EPIC. Pt has an appointment tomorrow. Derl Barrow, RN

## 2017-04-04 ENCOUNTER — Telehealth: Payer: Self-pay | Admitting: *Deleted

## 2017-04-04 ENCOUNTER — Other Ambulatory Visit: Payer: Self-pay | Admitting: Student in an Organized Health Care Education/Training Program

## 2017-04-04 ENCOUNTER — Ambulatory Visit (HOSPITAL_COMMUNITY)
Admission: RE | Admit: 2017-04-04 | Discharge: 2017-04-04 | Disposition: A | Discharge status: Home / Self Care | Payer: Medicare Other | Source: Ambulatory Visit | Attending: Family Medicine | Admitting: Family Medicine

## 2017-04-04 DIAGNOSIS — D509 Iron deficiency anemia, unspecified: Secondary | ICD-10-CM | POA: Diagnosis not present

## 2017-04-04 MED ORDER — SODIUM CHLORIDE 0.9 % IV SOLN
510.0000 mg | Freq: Once | INTRAVENOUS | Status: AC
  Administered 2017-04-04: 09:00:00 510 mg via INTRAVENOUS
  Filled 2017-04-04: qty 17

## 2017-04-04 NOTE — Telephone Encounter (Signed)
Spoke with short stay. Patient got her ferreheme, no additional work required

## 2017-04-04 NOTE — Telephone Encounter (Signed)
Ferreheme order is in. I'm currently on ICU rounds, if the order is incorrect can this be brought to a preceptor?

## 2017-04-04 NOTE — Discharge Instructions (Signed)

## 2017-04-09 DIAGNOSIS — L820 Inflamed seborrheic keratosis: Secondary | ICD-10-CM | POA: Diagnosis not present

## 2017-04-09 DIAGNOSIS — L821 Other seborrheic keratosis: Secondary | ICD-10-CM | POA: Diagnosis not present

## 2017-04-09 DIAGNOSIS — L853 Xerosis cutis: Secondary | ICD-10-CM | POA: Diagnosis not present

## 2017-04-09 DIAGNOSIS — I10 Essential (primary) hypertension: Secondary | ICD-10-CM | POA: Diagnosis not present

## 2017-04-09 DIAGNOSIS — E119 Type 2 diabetes mellitus without complications: Secondary | ICD-10-CM | POA: Diagnosis not present

## 2017-04-09 DIAGNOSIS — L570 Actinic keratosis: Secondary | ICD-10-CM | POA: Diagnosis not present

## 2017-04-09 DIAGNOSIS — L578 Other skin changes due to chronic exposure to nonionizing radiation: Secondary | ICD-10-CM | POA: Diagnosis not present

## 2017-04-09 DIAGNOSIS — D692 Other nonthrombocytopenic purpura: Secondary | ICD-10-CM | POA: Diagnosis not present

## 2017-04-09 DIAGNOSIS — Z85828 Personal history of other malignant neoplasm of skin: Secondary | ICD-10-CM | POA: Diagnosis not present

## 2017-04-09 DIAGNOSIS — M069 Rheumatoid arthritis, unspecified: Secondary | ICD-10-CM | POA: Diagnosis not present

## 2017-04-09 DIAGNOSIS — L280 Lichen simplex chronicus: Secondary | ICD-10-CM | POA: Diagnosis not present

## 2017-04-14 ENCOUNTER — Other Ambulatory Visit: Payer: Self-pay | Admitting: Student in an Organized Health Care Education/Training Program

## 2017-04-14 DIAGNOSIS — E119 Type 2 diabetes mellitus without complications: Secondary | ICD-10-CM

## 2017-04-14 MED ORDER — GLUCOSE BLOOD VI STRP: ORAL_STRIP | 2 refills | Status: DC

## 2017-04-14 NOTE — Telephone Encounter (Signed)
Medicare won't accept Dr. Annamaria Boots as the person to order her Accu-chek test strips. Please reorder and send to [place] on [address]. Needs these ASAP. Ottis Stain, CMA

## 2017-04-14 NOTE — Telephone Encounter (Signed)
Reordered and sent to [facility].

## 2017-04-15 ENCOUNTER — Telehealth: Payer: Self-pay

## 2017-04-15 DIAGNOSIS — M059 Rheumatoid arthritis with rheumatoid factor, unspecified: Secondary | ICD-10-CM | POA: Diagnosis not present

## 2017-04-15 DIAGNOSIS — E119 Type 2 diabetes mellitus without complications: Secondary | ICD-10-CM

## 2017-04-15 MED ORDER — GLUCOSE BLOOD VI STRP: ORAL_STRIP | 2 refills | Status: DC

## 2017-04-15 NOTE — Telephone Encounter (Signed)
Patient called back to say that her pharmacy will not fill her script for Accu check. Per conversation with Kennyth Lose it needs a faculty member on that team to authorize and sign the script before it will be accepted.Michelle [name]

## 2017-04-15 NOTE — Telephone Encounter (Signed)
Reordered test strips under Dr. Nori Riis. Pharmacy has received and should be ok to fill. LM for pt to call our office to inform her of this information. Ottis Stain, CMA

## 2017-04-18 ENCOUNTER — Encounter: Payer: Self-pay | Admitting: Student in an Organized Health Care Education/Training Program

## 2017-04-18 ENCOUNTER — Ambulatory Visit (INDEPENDENT_AMBULATORY_CARE_PROVIDER_SITE_OTHER): Payer: Medicare Other | Admitting: Student in an Organized Health Care Education/Training Program

## 2017-04-18 VITALS — BP 130/62 | HR 93 | Temp 98.3°F | Wt 155.0 lb

## 2017-04-18 DIAGNOSIS — N3941 Urge incontinence: Secondary | ICD-10-CM | POA: Insufficient documentation

## 2017-04-18 DIAGNOSIS — E119 Type 2 diabetes mellitus without complications: Secondary | ICD-10-CM

## 2017-04-18 LAB — POCT URINALYSIS DIP (MANUAL ENTRY)
BILIRUBIN UA: NEGATIVE
Glucose, UA: 100 mg/dL — AB
Ketones, POC UA: NEGATIVE mg/dL
Leukocytes, UA: NEGATIVE
NITRITE UA: NEGATIVE
PH UA: 6.5 (ref 5.0–8.0)
Protein Ur, POC: NEGATIVE mg/dL
RBC UA: NEGATIVE
Spec Grav, UA: 1.02 (ref 1.010–1.025)
Urobilinogen, UA: 0.2 E.U./dL

## 2017-04-18 NOTE — Assessment & Plan Note (Signed)
1 years duration, worsening, has previous procedure with uro-gyn - UA negative for acute process - referred to Urology this visit

## 2017-04-18 NOTE — Patient Instructions (Addendum)
It was a pleasure seeing you today in our clinic. Today we discussed your urinary incontinence. Here is the treatment plan we have discussed and agreed upon together:  A consult was placed to urology at today's visit.  You will receive a call to schedule an appointment. If you do not receive a call within two weeks please call our office so we can place the consult again.  For your diabetes, we can keep the medications the same for now, however it will be very important for you to make lifestyle changes to keep from needing more medications and/or insulin at your next A1c check. The dietitian card is attached to this sheet. Please consider calling to make an appointment.  Our clinic's number is [phone]. Please call with questions or concerns about what we discussed today.  Be well, Dr. Burr Medico

## 2017-04-18 NOTE — Assessment & Plan Note (Signed)
Reasonable to keep regimen the same because patient is so motivated to make lifestyle changes. Will recheck ~end of December for next A1c.  - given information for dietitian for further diet management - continue current regimen

## 2017-04-18 NOTE — Progress Notes (Signed)
   CC: Urinary incontinence  HPI: Michelle [name] is a 66 y.o. female with PMH significant for T2DM, HLD, HTN, etc who presents to [facility] today with urinary incontinence and for diabetes follow up.   Urinary incontinence - patient reports that she will have the urge to urinate, get up off the couch to go to the bathroom and urinate halfway down the hallway. - one year duration - has history of 2 vaginal deliveries - urge incontinence - no dysuria or urinary frequency - wakes three times per night to go to the bathroom - does have IBS with intermittent diarrhea - never has bowel incontinence - previously followed with Dr. Idolina Primer GYN and had "bladder tacked up"  Diabetes A1c drawn at last visit and noted to be elevated at 8.1 from 7.9 previously. Patient notes she has made recent lifestyle changes and very much prefers to keep medications the same and see if she can bring this number down with diet and exercise. - she quit eating candy bars - she cut back on soda - current medications for diabetes include metformin 1000 mg BID, stopped tradjenta because medicaid does not pay anymore. Now takes 5 mg saxagliptin daily  Review of Symptoms:  See HPI for ROS.   CC, SH/smoking status, and VS noted.  Objective: BP 130/62   Pulse 93   Temp 98.3 F (36.8 C) (Oral)   Wt 155 lb (70.3 kg)   SpO2 99%   BMI 27.46 kg/m  GEN: NAD, alert, cooperative, and pleasant. RESPIRATORY: clear to auscultation bilaterally with no wheezes, rhonchi or rales, good effort CV: RRR, no m/r/g, no peripheral edema GI: soft, non-tender, non-distended, no hepatosplenomegaly SKIN: warm and dry, no rashes or lesions NEURO: II-XII grossly intact, normal gait, peripheral sensation intact PSYCH: AAOx3, appropriate affect  Assessment and plan:  Urge incontinence of urine 1 years duration, worsening, has previous procedure with uro-gyn - UA negative for acute process - referred to Urology this visit  Diabetes mellitus  without complication ([place]) Reasonable to keep regimen the same because patient is so motivated to make lifestyle changes. Will recheck ~end of December for next A1c.  - given information for dietitian for further diet management - continue current regimen   Orders Placed This Encounter  Procedures  . Urine Microscopic  . Ambulatory referral to Urology    Referral Priority:   Routine    Referral Type:   Consultation    Referral Reason:   Specialty Services Required    Requested Specialty:   Urology    Number of Visits Requested:   1  . POCT urinalysis dipstick    No orders of the defined types were placed in this encounter.    Everrett Coombe, MD,MS,  PGY2 04/18/2017 3:42 PM

## 2017-04-24 ENCOUNTER — Telehealth: Payer: Self-pay

## 2017-04-24 DIAGNOSIS — M65331 Trigger finger, right middle finger: Secondary | ICD-10-CM | POA: Diagnosis not present

## 2017-04-24 NOTE — Telephone Encounter (Signed)
Patient wants a prescription to be sent to her pharmacy for a UTI. She was not willing to make an appointment and says that she is not going to wait forever and is very uncomfortable.Michelle [name]

## 2017-04-25 ENCOUNTER — Ambulatory Visit (INDEPENDENT_AMBULATORY_CARE_PROVIDER_SITE_OTHER): Payer: Medicare Other | Admitting: Internal Medicine

## 2017-04-25 VITALS — BP 124/60 | HR 71 | Temp 98.4°F | Ht 63.0 in | Wt 154.0 lb

## 2017-04-25 DIAGNOSIS — R399 Unspecified symptoms and signs involving the genitourinary system: Secondary | ICD-10-CM | POA: Diagnosis not present

## 2017-04-25 LAB — POCT UA - MICROSCOPIC ONLY

## 2017-04-25 LAB — POCT URINALYSIS DIP (MANUAL ENTRY)
Bilirubin, UA: NEGATIVE
Glucose, UA: NEGATIVE mg/dL
Ketones, POC UA: NEGATIVE mg/dL
NITRITE UA: POSITIVE — AB
PH UA: 6 (ref 5.0–8.0)
Protein Ur, POC: 100 mg/dL — AB
Spec Grav, UA: 1.015 (ref 1.010–1.025)
UROBILINOGEN UA: 0.2 U/dL

## 2017-04-25 MED ORDER — CEPHALEXIN 500 MG PO CAPS: 500.0000 mg | ORAL_CAPSULE | Freq: Four times a day (QID) | ORAL | 0 refills | Status: DC

## 2017-04-25 MED ORDER — FLUCONAZOLE 150 MG PO TABS: 150.0000 mg | ORAL_TABLET | Freq: Once | ORAL | 0 refills | Status: AC

## 2017-04-25 NOTE — Progress Notes (Signed)
   [name] Kerrin Mo, MD Phone: [phone]  Reason For Visit:  UTI symptoms   Here with urgency. She states that she developed urgency with urination 3 days ago.  She eyes any significant pain with urination.  She also notes some increased frequency of urination.  She notes that her urine smells.  She denies any fevers, chills, or  pelvic pain.  Not taken any medications for this.  She has not had any antibiotics recently.  She denies having increased frequency of UTIs over the last 12 months.  She has not been exposed to STDs.  Symptoms Urgency: yes  Frequency: yes Blood in urine: none Pain in back:none  Fever: none  Vaginal discharge: none  Mouth Ulcers: non   Review of Symptoms - see HPI PMH - Smoking status noted.    Objective: BP 124/60   Pulse 71   Temp 98.4 F (36.9 C) (Oral)   Ht 5\' 3"  (1.6 m)   Wt 154 lb (69.9 kg)   SpO2 95%   BMI 27.28 kg/m  Gen: NAD, alert, cooperative with exam GI: soft, non-tender, non-distended, bowel sounds present, no hepatomegaly, no splenomegaly Extremities: warm, well perfused, No edema, cyanosis or clubbing;    Assessment/Plan: See problem based a/p  UTI symptoms Likely UTI given UA.  Patient is supposed to follow-up with urology for incontinence as Dr. Burr Medico had seen he a week ago for urinary incontinence, UA not concerning for UTI at that time. However UA currently concerning with new symptoms. No systemic concern given lack of fever, nausea, vomiting and well appearing.  - POCT urinalysis dipstick - POCT UA - Microscopic Only - Urine Culture - cephALEXin (KEFLEX) 500 MG capsule; Take 1 capsule (500 mg total) by mouth 4 (four) times daily.  Dispense: 14 capsule; Refill: 0 - fluconazole (DIFLUCAN) 150 MG tablet; Take 1 tablet (150 mg total) by mouth once.  Dispense: 1 tablet; Refill: 0 - gets yeast infections when taking antibiotics - will given diflucan in case keflex causes yeast infection

## 2017-04-25 NOTE — Assessment & Plan Note (Addendum)
Likely UTI given UA.  Patient is supposed to follow-up with urology for incontinence as Dr. Burr Medico had seen he a week ago for urinary incontinence, UA not concerning for UTI at that time. However UA currently concerning with new symptoms. No systemic concern given lack of fever, nausea, vomiting and well appearing.  - POCT urinalysis dipstick - POCT UA - Microscopic Only - Urine Culture - cephALEXin (KEFLEX) 500 MG capsule; Take 1 capsule (500 mg total) by mouth 4 (four) times daily.  Dispense: 14 capsule; Refill: 0 - fluconazole (DIFLUCAN) 150 MG tablet; Take 1 tablet (150 mg total) by mouth once.  Dispense: 1 tablet; Refill: 0 - gets yeast infections when taking antibiotics - will given diflucan in case keflex causes yeast infection

## 2017-04-25 NOTE — Telephone Encounter (Signed)
Pt coming in this afternoon on overflow list. Michelle [name], CMA

## 2017-04-25 NOTE — Telephone Encounter (Signed)
Patient needs to be seen and evaluated for possible UTI. Please ask her to schedule a same day appointment.  I realize she does not want to come into the office, however it would not be good patient care for me to prescribe antibiotics for this problem without her being evaluated first.

## 2017-04-25 NOTE — Patient Instructions (Addendum)
I want you to take keflex for 7 days. Please take this medication in the morning and at night. If your symptoms persist, please follow up in the clinic

## 2017-04-28 LAB — URINE CULTURE

## 2017-05-13 ENCOUNTER — Other Ambulatory Visit: Payer: Self-pay | Admitting: Student in an Organized Health Care Education/Training Program

## 2017-05-13 DIAGNOSIS — J45909 Unspecified asthma, uncomplicated: Secondary | ICD-10-CM

## 2017-05-13 MED ORDER — MOMETASONE FUROATE 200 MCG/ACT IN AERO: 1.0000 | INHALATION_SPRAY | Freq: Two times a day (BID) | RESPIRATORY_TRACT | 1 refills | Status: DC

## 2017-05-13 NOTE — Progress Notes (Signed)
Received fax request for refill on mometasone. Sent to pharmacy.

## 2017-05-19 NOTE — Progress Notes (Deleted)
Subjective:   Michelle [name] was seen in consultation in the movement disorder clinic at the request of Everrett Coombe, MD.  The evaluation is for tremor.  She is accompanied by her granddaughter who supplements the history.  Pt is a 66 y.o. female with hx of chronic pain, RA, anxiety, DM who presents with c/o tremor.  Tremor started approximately a year ago and involves the L hand.  She notes it when carrying something like a plate.  Pt was on prednisone for RA recently but tremor is independent of the prednisone.     Specific Symptoms:  Tremor: Yes.   Family hx of similar:  Yes.  , both mother and sister had tremor but no known dx Voice: "everyone says I talk too loud" Sleep: multiple awakenings to use the BR; she is sleepy during the day  Vivid Dreams:  No.  Acting out dreams:  rarely Wet Pillows: No. Postural symptoms:  Yes.    Falls?  No. but had near falls Bradykinesia symptoms: slow movements, drooling while awake and difficulty getting out of a chair Loss of smell:  No., better after septoplasty Loss of taste:  No. Urinary Incontinence:  Yes.  , urinary urgency and frequency Difficulty Swallowing:  No. Handwriting, micrographia: No. Trouble with ADL's:  No.  Trouble buttoning clothing: Yes.  , but because of RA Depression:  Yes.   Memory changes:  Yes.   Hallucinations:  No.  visual distortions: Yes.   N/V:  Yes.   (emesis with ice cream but attributes to GERD) Lightheaded:  Yes.    Syncope: No. Diplopia:  No. Dyskinesia:  No.  Neuroimaging has previously been performed.  It is available for my review today.  MRI done in 01/2013 Demonstrated scattered T2 hyperintensities, overall mild, and a developmental venous anomaly in the left frontal operculum.  05/26/17 update:  Pt seen in one year follow-up today.  I have reviewed records available to me since last visit.  She had a low B12 level done in September.  ***She has been following at [facility] for her cough variant  asthma.   ALLERGIES:   Allergies  Allergen Reactions  . Ace Inhibitors Cough    Occurred with liinopril  . Sulfonamide Derivatives Hives and Itching    CURRENT MEDICATIONS:  Outpatient Encounter Medications as of 05/26/2017  Medication Sig  . albuterol (VENTOLIN HFA) 108 (90 Base) MCG/ACT inhaler INHALE TWO PUFFS BY MOUTH EVERY 4 HOURS AS NEEDED FOR WHEEZING FOR SHORTNESS OF BREATH  . aspirin EC 81 MG tablet Take 81 mg by mouth daily.  . canagliflozin (INVOKANA) 100 MG TABS tablet Take 1 tablet (100 mg total) by mouth daily before breakfast.  . capsicum (ZOSTRIX) 0.075 % topical cream Apply 1 application topically 2 (two) times daily.  . cephALEXin (KEFLEX) 500 MG capsule Take 1 capsule (500 mg total) by mouth 4 (four) times daily.  . cetirizine (ZYRTEC) 10 MG tablet TAKE ONE TABLET BY MOUTH ONCE DAILY  . diclofenac sodium (VOLTAREN) 1 % GEL Apply 2 g topically 4 (four) times daily as needed (for pain).  . ferrous gluconate (FERGON) 324 MG tablet Take 1 tablet (324 mg total) by mouth daily with breakfast.  . fluticasone (FLONASE) 50 MCG/ACT nasal spray Place 2 sprays into both nostrils daily.  Marland Kitchen glucose blood (ACCU-CHEK AVIVA PLUS) test strip Use to test blood sugar four times daily as needed. ICD-10 code: E11.9  . leflunomide (ARAVA) 10 MG tablet Take 10 mg by mouth daily.  Marland Kitchen  linagliptin (TRADJENTA) 5 MG TABS tablet Take 1 tablet (5 mg total) by mouth daily.  Marland Kitchen losartan (COZAAR) 25 MG tablet Take 1 tablet (25 mg total) by mouth daily.  . metFORMIN (GLUCOPHAGE) 1000 MG tablet Take 1 tablet (1,000 mg total) by mouth 2 (two) times daily with a meal.  . Mometasone Furoate 200 MCG/ACT AERO Inhale 1 puff 2 (two) times daily into the lungs.  . nitroGLYCERIN (NITROSTAT) 0.4 MG SL tablet Place 0.4 mg under the tongue every 5 (five) minutes as needed.    Marland Kitchen omeprazole (PRILOSEC) 40 MG capsule TAKE ONE CAPSULE BY MOUTH TWICE DAILY  . oxyCODONE-acetaminophen (PERCOCET) 10-325 MG tablet Take 1  tablet by mouth every 6 (six) hours as needed for pain.  . rosuvastatin (CRESTOR) 10 MG tablet Take 1 tablet (10 mg total) by mouth daily.  . sertraline (ZOLOFT) 50 MG tablet Take 1 tablet (50 mg total) by mouth daily.  Marland Kitchen triamcinolone (KENALOG) 0.1 % paste USE AS DIRECTED IN  MOUTH/THROAT  TO  THE  LESION  TWICE  DAILY  . Triamcinolone Acetonide (TRIAMCINOLONE 0.1 % CREAM : EUCERIN) CREA Apply 1 application topically 2 (two) times daily.   No facility-administered encounter medications on file as of 05/26/2017.     PAST MEDICAL HISTORY:   Past Medical History:  Diagnosis Date  . Allergy   . Anxiety   . Asthma   . Cataract    beginning on left eye  . Diabetes mellitus   . Diverticulosis   . GERD (gastroesophageal reflux disease)   . HLD (hyperlipidemia)   . Lung nodules   . Osteoporosis   . RA (rheumatoid arthritis) ([place])    CCP positive followed by [facility]    PAST SURGICAL HISTORY:   Past Surgical History:  Procedure Laterality Date  . ABDOMINAL HYSTERECTOMY    . APPENDECTOMY    . CARPAL TUNNEL RELEASE    . CATARACT EXTRACTION Left    with lid lift  . CHOLECYSTECTOMY    . HERNIA REPAIR    . KNEE SURGERY     x2  . LEFT AND RIGHT HEART CATHETERIZATION WITH CORONARY ANGIOGRAM N/A 08/17/2013   Performed by Adrian Prows, MD at [facility] CATH LAB  . right foot bone spurs removed    . SHOULDER SURGERY     x2    SOCIAL HISTORY:   Social History   Socioeconomic History  . Marital status: Married    Spouse name: Not on file  . Number of children: 3  . Years of education: Not on file  . Highest education level: Not on file  Social Needs  . Financial resource strain: Not on file  . Food insecurity - worry: Not on file  . Food insecurity - inability: Not on file  . Transportation needs - medical: Not on file  . Transportation needs - non-medical: Not on file  Occupational History  . Occupation: disabled    Fish farm manager: UNEMPLOYED  Tobacco Use  . Smoking status: Former Smoker     Packs/day: 1.50    Years: 5.00    Pack years: 7.50    Types: Cigarettes    Last attempt to quit: 05/31/1994    Years since quitting: 22.9  . Smokeless tobacco: Never Used  Substance and Sexual Activity  . Alcohol use: No    Alcohol/week: 0.0 oz  . Drug use: No  . Sexual activity: Not on file  Other Topics Concern  . Not on file  Social History Narrative  Pt is on [employer] program [place]   Lots of family stressors including grandaughter who lives with her          FAMILY HISTORY:   Family Status  Relation Name Status  . Father  Deceased  . Mat Aunt  Deceased  . Mother  Deceased  . Brother  (Not Specified)  . Sister  (Not Specified)  . Daughter  (Not Specified)  . Sister  (Not Specified)  . Sister  (Not Specified)  . Neg Hx  (Not Specified)    ROS:  Chronic SOB.  A complete 10 system review of systems was obtained and was unremarkable apart from what is mentioned above.  PHYSICAL EXAMINATION:    VITALS:   There were no vitals filed for this visit.  GEN:  The patient appears stated age and is in NAD. HEENT:  Normocephalic, atraumatic.  The mucous membranes are moist. The superficial temporal arteries are without ropiness or tenderness. CV:  RRR Lungs:  CTAB Neck/HEME:  There are no carotid bruits bilaterally.  Neurological examination:  Orientation: The patient is alert and oriented x3. Fund of knowledge is appropriate.  Recent and remote memory are intact.  Attention and concentration are normal.    Able to name objects and repeat phrases. Cranial nerves: There is good facial symmetry. Pupils are equal round and reactive to light bilaterally. Fundoscopic exam reveals clear margins bilaterally. Extraocular muscles are intact. The visual fields are full to confrontational testing. The speech is fluent and clear. Soft palate rises symmetrically and there is no tongue deviation. Hearing is intact to conversational tone. Sensation: Sensation is intact to light and pinprick  throughout (facial, trunk, extremities). Vibration is intact at the bilateral big toe. There is no extinction with double simultaneous stimulation. There is no sensory dermatomal level identified. Motor: Strength is 5/5 in the bilateral upper and lower extremities.   Shoulder shrug is equal and symmetric.  There is no pronator drift. Deep tendon reflexes: Deep tendon reflexes are 2/4 at the bilateral biceps, triceps, brachioradialis, patella and achilles. Plantar responses are downgoing bilaterally.  Movement examination: Tone: There is normal tone in the bilateral upper extremities.  The tone in the lower extremities is normal.  Abnormal movements: There is no rest tremor.  There is tremor of the outstretched hands increases with intention on the left.  She has mild difficulty with Archimedes spirals on the left.  She spills some water when asked to pour a full glass of water from one glass to another, and tremor is evident when the focus is in the left hand. Coordination:  There is no decremation with RAM's, with any form of RAMS, including alternating supination and pronation of the forearm, hand opening and closing, finger taps, heel taps and toe taps. Gait and Station: The patient has no difficulty arising out of a deep-seated chair without the use of the hands. The patient's stride length is normal, with normal arm swing.     LABS  Lab Results  Component Value Date   TSH 2.160 11/26/2016   Lab Results  Component Value Date   VITAMINB12 260 03/26/2017    She had lab work at [facility] on 04/03/2016.  I reviewed those.  Her AST was 23 and ALT 44.  Her CBC was normal.    Chemistry      Component Value Date/Time   NA 138 11/26/2016 0943   K 4.4 11/26/2016 0943   CL 102 11/26/2016 0943   CO2 23 11/26/2016 0943  BUN 18 11/26/2016 0943   CREATININE 0.60 11/26/2016 0943   CREATININE 0.46 (L) 07/22/2016 1026      Component Value Date/Time   CALCIUM 9.1 11/26/2016 0943   ALKPHOS 88  11/26/2016 0943   AST 24 11/26/2016 0943   ALT 24 11/26/2016 0943   BILITOT 0.3 11/26/2016 0943       ASSESSMENT/PLAN:  1.   Essential Tremor.  -I told the patient that this likely represents asymmetric essential tremor.    2.  ***B12 deficiency  3. ***Iron deficiency anemia  _***     Cc:  Everrett Coombe, MD

## 2017-05-20 ENCOUNTER — Other Ambulatory Visit: Payer: Self-pay | Admitting: Family Medicine

## 2017-05-20 MED ORDER — LOSARTAN POTASSIUM 25 MG PO TABS: 25.0000 mg | ORAL_TABLET | Freq: Every day | ORAL | 1 refills | Status: DC

## 2017-05-21 ENCOUNTER — Ambulatory Visit: Payer: Medicare Other | Admitting: Neurology

## 2017-05-26 ENCOUNTER — Ambulatory Visit: Payer: Medicare Other | Admitting: Neurology

## 2017-05-26 NOTE — Progress Notes (Signed)
Subjective:   Michelle [name] was seen in consultation in the movement disorder clinic at the request of Everrett Coombe, MD.  The evaluation is for tremor.  She is accompanied by her granddaughter who supplements the history.  Pt is a 66 y.o. female with hx of chronic pain, RA, anxiety, DM who presents with c/o tremor.  Tremor started approximately a year ago and involves the L hand.  She notes it when carrying something like a plate.  Pt was on prednisone for RA recently but tremor is independent of the prednisone.     Specific Symptoms:  Tremor: Yes.   Family hx of similar:  Yes.  , both mother and sister had tremor but no known dx Voice: "everyone says I talk too loud" Sleep: multiple awakenings to use the BR; she is sleepy during the day  Vivid Dreams:  No.  Acting out dreams:  rarely Wet Pillows: No. Postural symptoms:  Yes.    Falls?  No. but had near falls Bradykinesia symptoms: slow movements, drooling while awake and difficulty getting out of a chair Loss of smell:  No., better after septoplasty Loss of taste:  No. Urinary Incontinence:  Yes.  , urinary urgency and frequency Difficulty Swallowing:  No. Handwriting, micrographia: No. Trouble with ADL's:  No.  Trouble buttoning clothing: Yes.  , but because of RA Depression:  Yes.   Memory changes:  Yes.   Hallucinations:  No.  visual distortions: Yes.   N/V:  Yes.   (emesis with ice cream but attributes to GERD) Lightheaded:  Yes.    Syncope: No. Diplopia:  No. Dyskinesia:  No.  Neuroimaging has previously been performed.  It is available for my review today.  MRI done in 01/2013 Demonstrated scattered T2 hyperintensities, overall mild, and a developmental venous anomaly in the left frontal operculum.  05/28/17 update:  Pt seen in one year follow-up today. Tremor has been worse in the L hand and she is noting some chin/lip tremor.   I have reviewed records available to me since last visit.  She had a low B12 level done in  September. She has been following at [facility] for her cough variant asthma.  She states that she is worried about lung CA.     ALLERGIES:   Allergies  Allergen Reactions  . Ace Inhibitors Cough    Occurred with liinopril  . Sulfonamide Derivatives Hives and Itching    CURRENT MEDICATIONS:  Outpatient Encounter Medications as of 05/28/2017  Medication Sig  . albuterol (VENTOLIN HFA) 108 (90 Base) MCG/ACT inhaler INHALE TWO PUFFS BY MOUTH EVERY 4 HOURS AS NEEDED FOR WHEEZING FOR SHORTNESS OF BREATH  . aspirin EC 81 MG tablet Take 81 mg by mouth daily.  . cetirizine (ZYRTEC) 10 MG tablet TAKE ONE TABLET BY MOUTH ONCE DAILY  . diclofenac sodium (VOLTAREN) 1 % GEL Apply 2 g topically 4 (four) times daily as needed (for pain).  . fluticasone (FLONASE) 50 MCG/ACT nasal spray Place 2 sprays into both nostrils daily.  Marland Kitchen gabapentin (NEURONTIN) 300 MG capsule Take 300 mg by mouth at bedtime.  . hydrOXYzine (ATARAX/VISTARIL) 25 MG tablet Take 25 mg by mouth as needed.  . inFLIXimab (REMICADE) 100 MG injection Inject into the vein.  Marland Kitchen leflunomide (ARAVA) 10 MG tablet Take 10 mg by mouth daily.  Marland Kitchen losartan (COZAAR) 25 MG tablet Take 1 tablet (25 mg total) by mouth daily.  . metFORMIN (GLUCOPHAGE) 1000 MG tablet Take 1 tablet (1,000 mg total) by  mouth 2 (two) times daily with a meal.  . Mometasone Furoate 200 MCG/ACT AERO Inhale 1 puff 2 (two) times daily into the lungs.  . neomycin-bacitracin-polymyxin (NEOSPORIN) ointment Apply topically.  Marland Kitchen omeprazole (PRILOSEC) 40 MG capsule TAKE ONE CAPSULE BY MOUTH TWICE DAILY  . ONGLYZA 5 MG TABS tablet Take 5 mg by mouth daily.  Marland Kitchen oxyCODONE-acetaminophen (PERCOCET) 10-325 MG tablet Take 1 tablet by mouth every 6 (six) hours as needed for pain.  Marland Kitchen sertraline (ZOLOFT) 50 MG tablet Take 1 tablet (50 mg total) by mouth daily.  Marland Kitchen triamcinolone (KENALOG) 0.1 % paste USE AS DIRECTED IN  MOUTH/THROAT  TO  THE  LESION  TWICE  DAILY  . glucose blood (ACCU-CHEK AVIVA PLUS)  test strip Use to test blood sugar four times daily as needed. ICD-10 code: E11.9 (Patient not taking: Reported on 05/28/2017)  . nitroGLYCERIN (NITROSTAT) 0.4 MG SL tablet Place 0.4 mg under the tongue every 5 (five) minutes as needed.    . [DISCONTINUED] canagliflozin (INVOKANA) 100 MG TABS tablet Take 1 tablet (100 mg total) by mouth daily before breakfast.  . [DISCONTINUED] capsicum (ZOSTRIX) 0.075 % topical cream Apply 1 application topically 2 (two) times daily.  . [DISCONTINUED] cephALEXin (KEFLEX) 500 MG capsule Take 1 capsule (500 mg total) by mouth 4 (four) times daily.  . [DISCONTINUED] ferrous gluconate (FERGON) 324 MG tablet Take 1 tablet (324 mg total) by mouth daily with breakfast.  . [DISCONTINUED] linagliptin (TRADJENTA) 5 MG TABS tablet Take 1 tablet (5 mg total) by mouth daily.  . [DISCONTINUED] rosuvastatin (CRESTOR) 10 MG tablet Take 1 tablet (10 mg total) by mouth daily.  . [DISCONTINUED] Triamcinolone Acetonide (TRIAMCINOLONE 0.1 % CREAM : EUCERIN) CREA Apply 1 application topically 2 (two) times daily.   No facility-administered encounter medications on file as of 05/28/2017.     PAST MEDICAL HISTORY:   Past Medical History:  Diagnosis Date  . Allergy   . Anxiety   . Asthma   . Cataract    beginning on left eye  . Diabetes mellitus   . Diverticulosis   . GERD (gastroesophageal reflux disease)   . HLD (hyperlipidemia)   . Lung nodules   . Osteoporosis   . RA (rheumatoid arthritis) ([place])    CCP positive followed by [facility]    PAST SURGICAL HISTORY:   Past Surgical History:  Procedure Laterality Date  . ABDOMINAL HYSTERECTOMY    . APPENDECTOMY    . CARPAL TUNNEL RELEASE    . CATARACT EXTRACTION Left    with lid lift  . CHOLECYSTECTOMY    . HERNIA REPAIR    . KNEE SURGERY     x2  . LEFT AND RIGHT HEART CATHETERIZATION WITH CORONARY ANGIOGRAM N/A 08/17/2013   Procedure: LEFT AND RIGHT HEART CATHETERIZATION WITH CORONARY ANGIOGRAM;  Surgeon: Laverda Page,  MD;  Location: [facility] CATH LAB;  Service: Cardiovascular;  Laterality: N/A;  . right foot bone spurs removed    . SHOULDER SURGERY     x2    SOCIAL HISTORY:   Social History   Socioeconomic History  . Marital status: Married    Spouse name: Not on file  . Number of children: 3  . Years of education: Not on file  . Highest education level: Not on file  Social Needs  . Financial resource strain: Not on file  . Food insecurity - worry: Not on file  . Food insecurity - inability: Not on file  . Transportation needs - medical: Not  on file  . Transportation needs - non-medical: Not on file  Occupational History  . Occupation: disabled    Fish farm manager: UNEMPLOYED  Tobacco Use  . Smoking status: Former Smoker    Packs/day: 1.50    Years: 5.00    Pack years: 7.50    Types: Cigarettes    Last attempt to quit: 05/31/1994    Years since quitting: 23.0  . Smokeless tobacco: Never Used  Substance and Sexual Activity  . Alcohol use: No    Alcohol/week: 0.0 oz  . Drug use: No  . Sexual activity: Not on file  Other Topics Concern  . Not on file  Social History Narrative   Pt is on [employer] program [place]   Lots of family stressors including grandaughter who lives with her          FAMILY HISTORY:   Family Status  Relation Name Status  . Father  Deceased  . Mat Aunt  Deceased  . Mother  Deceased  . Brother  (Not Specified)  . Sister  (Not Specified)  . Daughter  (Not Specified)  . Sister  (Not Specified)  . Sister  (Not Specified)  . Neg Hx  (Not Specified)    ROS:  Chronic SOB.  Chronic cough.  Having urinary frequency.  A complete 10 system review of systems was obtained and was unremarkable apart from what is mentioned above.  PHYSICAL EXAMINATION:    VITALS:   Vitals:   05/28/17 0840  BP: 134/80  Pulse: 90  SpO2: 97%  Weight: 156 lb (70.8 kg)  Height: 5\' 3"  (1.6 m)    GEN:  The patient appears stated age and is in NAD. HEENT:  Normocephalic, atraumatic.  The mucous  membranes are moist. The superficial temporal arteries are without ropiness or tenderness. CV:  RRR Lungs:  She has expiratory wheezes bilaterally Neck/HEME:  There are no carotid bruits bilaterally.  Neurological examination:  Orientation: The patient is alert and oriented x3.  Cranial nerves: There is good facial symmetry.  Extraocular muscles are intact. The visual fields are full to confrontational testing. The speech is fluent and clear. Soft palate rises symmetrically and there is no tongue deviation. Hearing is intact to conversational tone. Sensation: Sensation is intact to light and pinprick throughout (facial, trunk, extremities). Vibration is intact at the bilateral big toe. There is no extinction with double simultaneous stimulation. There is no sensory dermatomal level identified. Motor: Strength is 5/5 in the bilateral upper and lower extremities.   Shoulder shrug is equal and symmetric.  There is no pronator drift.  Movement examination: Tone: There is normal tone in the bilateral upper extremities.  The tone in the lower extremities is normal.  Abnormal movements: There is no rest tremor.  There is tremor of the outstretched hands increases with intention on the left.  She has mild to mod difficulty with Archimedes spirals on the left but it is worse than a year ago Coordination:  There is no decremation with RAM's, with any form of RAMS, including alternating supination and pronation of the forearm, hand opening and closing, finger taps, heel taps and toe taps. Gait and Station: The patient has no difficulty arising out of a deep-seated chair without the use of the hands. The patient's stride length is normal, with normal arm swing.     LABS  Lab Results  Component Value Date   TSH 2.160 11/26/2016   Lab Results  Component Value Date   VITAMINB12 260 03/26/2017  She had lab work at [facility] on 04/03/2016.  I reviewed those.  Her AST was 23 and ALT 44.  Her CBC was  normal.    Chemistry      Component Value Date/Time   NA 138 11/26/2016 0943   K 4.4 11/26/2016 0943   CL 102 11/26/2016 0943   CO2 23 11/26/2016 0943   BUN 18 11/26/2016 0943   CREATININE 0.60 11/26/2016 0943   CREATININE 0.46 (L) 07/22/2016 1026      Component Value Date/Time   CALCIUM 9.1 11/26/2016 0943   ALKPHOS 88 11/26/2016 0943   AST 24 11/26/2016 0943   ALT 24 11/26/2016 0943   BILITOT 0.3 11/26/2016 0943       ASSESSMENT/PLAN:  1.   Essential Tremor.  -I told the patient that this likely represents asymmetric essential tremor, worsened by the medications for her lungs.   She wants medication for this.  Will start primidone, 50 mg daily.  Risks, benefits, side effects and alternative therapies were discussed.  The opportunity to ask questions was given and they were answered to the best of my ability.  The patient expressed understanding and willingness to follow the outlined treatment protocols.   2.  B12 deficiency  -start oral 1086mcg daily.  3. Iron deficiency anemia  -receiving iron infusions 4.  Urinary frequency  -she went two times during the visit.  She has an appointment upcoming with urology 5.  Follow up is anticipated in the next few months, sooner should new neurologic issues arise.  Much greater than 50% of this visit was spent in counseling and coordinating care.  Total face to face time:  25 min     Cc:  Everrett Coombe, MD

## 2017-05-28 ENCOUNTER — Ambulatory Visit (INDEPENDENT_AMBULATORY_CARE_PROVIDER_SITE_OTHER): Payer: Medicare Other | Admitting: Neurology

## 2017-05-28 ENCOUNTER — Encounter: Payer: Self-pay | Admitting: Neurology

## 2017-05-28 VITALS — BP 134/80 | HR 90 | Ht 63.0 in | Wt 156.0 lb

## 2017-05-28 DIAGNOSIS — G250 Essential tremor: Secondary | ICD-10-CM

## 2017-05-28 DIAGNOSIS — R350 Frequency of micturition: Secondary | ICD-10-CM | POA: Diagnosis not present

## 2017-05-28 DIAGNOSIS — E538 Deficiency of other specified B group vitamins: Secondary | ICD-10-CM | POA: Diagnosis not present

## 2017-05-28 MED ORDER — PRIMIDONE 50 MG PO TABS: 50.0000 mg | ORAL_TABLET | Freq: Every day | ORAL | 1 refills | Status: DC

## 2017-05-28 NOTE — Patient Instructions (Addendum)
1.  Start b12 pills - 1034mcg (micrograms) daily 2.  Start primidone - 50 mg - 1/2 tablet at night for 4 nights and then increase to 1 tablet at night

## 2017-06-05 DIAGNOSIS — M65331 Trigger finger, right middle finger: Secondary | ICD-10-CM | POA: Diagnosis not present

## 2017-06-12 DIAGNOSIS — N3941 Urge incontinence: Secondary | ICD-10-CM | POA: Diagnosis not present

## 2017-06-12 DIAGNOSIS — R3915 Urgency of urination: Secondary | ICD-10-CM | POA: Diagnosis not present

## 2017-06-17 DIAGNOSIS — Z79899 Other long term (current) drug therapy: Secondary | ICD-10-CM | POA: Diagnosis not present

## 2017-06-17 DIAGNOSIS — M059 Rheumatoid arthritis with rheumatoid factor, unspecified: Secondary | ICD-10-CM | POA: Diagnosis not present

## 2017-06-18 DIAGNOSIS — E785 Hyperlipidemia, unspecified: Secondary | ICD-10-CM | POA: Diagnosis not present

## 2017-06-18 DIAGNOSIS — Z7982 Long term (current) use of aspirin: Secondary | ICD-10-CM | POA: Diagnosis not present

## 2017-06-18 DIAGNOSIS — I10 Essential (primary) hypertension: Secondary | ICD-10-CM | POA: Diagnosis not present

## 2017-06-18 DIAGNOSIS — K219 Gastro-esophageal reflux disease without esophagitis: Secondary | ICD-10-CM | POA: Diagnosis not present

## 2017-06-18 DIAGNOSIS — Z87891 Personal history of nicotine dependence: Secondary | ICD-10-CM | POA: Diagnosis not present

## 2017-06-18 DIAGNOSIS — Z9229 Personal history of other drug therapy: Secondary | ICD-10-CM | POA: Diagnosis not present

## 2017-06-18 DIAGNOSIS — J45909 Unspecified asthma, uncomplicated: Secondary | ICD-10-CM | POA: Diagnosis not present

## 2017-06-18 DIAGNOSIS — Z79899 Other long term (current) drug therapy: Secondary | ICD-10-CM | POA: Diagnosis not present

## 2017-06-18 DIAGNOSIS — J45991 Cough variant asthma: Secondary | ICD-10-CM | POA: Diagnosis not present

## 2017-06-18 DIAGNOSIS — Z882 Allergy status to sulfonamides status: Secondary | ICD-10-CM | POA: Diagnosis not present

## 2017-06-18 DIAGNOSIS — Z85828 Personal history of other malignant neoplasm of skin: Secondary | ICD-10-CM | POA: Diagnosis not present

## 2017-06-18 DIAGNOSIS — M1990 Unspecified osteoarthritis, unspecified site: Secondary | ICD-10-CM | POA: Diagnosis not present

## 2017-06-18 DIAGNOSIS — F329 Major depressive disorder, single episode, unspecified: Secondary | ICD-10-CM | POA: Diagnosis not present

## 2017-06-18 DIAGNOSIS — Z881 Allergy status to other antibiotic agents status: Secondary | ICD-10-CM | POA: Diagnosis not present

## 2017-06-18 DIAGNOSIS — Z801 Family history of malignant neoplasm of trachea, bronchus and lung: Secondary | ICD-10-CM | POA: Diagnosis not present

## 2017-06-18 DIAGNOSIS — Z7984 Long term (current) use of oral hypoglycemic drugs: Secondary | ICD-10-CM | POA: Diagnosis not present

## 2017-06-18 DIAGNOSIS — Z7951 Long term (current) use of inhaled steroids: Secondary | ICD-10-CM | POA: Diagnosis not present

## 2017-06-18 DIAGNOSIS — E119 Type 2 diabetes mellitus without complications: Secondary | ICD-10-CM | POA: Diagnosis not present

## 2017-06-18 DIAGNOSIS — M069 Rheumatoid arthritis, unspecified: Secondary | ICD-10-CM | POA: Diagnosis not present

## 2017-06-20 ENCOUNTER — Other Ambulatory Visit: Payer: Self-pay

## 2017-06-20 ENCOUNTER — Ambulatory Visit: Payer: Medicare Other | Admitting: Student in an Organized Health Care Education/Training Program

## 2017-06-20 ENCOUNTER — Encounter: Payer: Self-pay | Admitting: Student in an Organized Health Care Education/Training Program

## 2017-06-20 ENCOUNTER — Ambulatory Visit (INDEPENDENT_AMBULATORY_CARE_PROVIDER_SITE_OTHER): Payer: Medicare Other | Admitting: Student in an Organized Health Care Education/Training Program

## 2017-06-20 VITALS — BP 122/60 | HR 95 | Temp 97.9°F | Ht 63.0 in | Wt 155.2 lb

## 2017-06-20 DIAGNOSIS — G894 Chronic pain syndrome: Secondary | ICD-10-CM

## 2017-06-20 DIAGNOSIS — E119 Type 2 diabetes mellitus without complications: Secondary | ICD-10-CM

## 2017-06-20 DIAGNOSIS — G8929 Other chronic pain: Secondary | ICD-10-CM | POA: Diagnosis not present

## 2017-06-20 LAB — POCT GLYCOSYLATED HEMOGLOBIN (HGB A1C): Hemoglobin A1C: 7.2

## 2017-06-20 MED ORDER — OXYCODONE-ACETAMINOPHEN 10-325 MG PO TABS: 1.0000 | ORAL_TABLET | Freq: Four times a day (QID) | ORAL | 0 refills | Status: DC | PRN

## 2017-06-20 NOTE — Patient Instructions (Signed)
It was a pleasure seeing you today in our clinic. Today we discussed your diabetes and pain. Here is the treatment plan we have discussed and agreed upon together:  Diabetes Your diabetes is improved. Your A1c is 7.2 Your goal is to have an A1c < 7.5 Medicine Changes: None Homework: Continue to focus on diet! Good work!   Come back to see [place] in: 3 months   Our clinic's number is [phone]. Please call with questions or concerns about what we discussed today.  Be well, Dr. Burr Medico

## 2017-06-20 NOTE — Assessment & Plan Note (Signed)
A1c is improved from previous visit. Patient reports that she has been focusing on diet. - Continue current medication regimen - Encourage continue focus on diet - Follow-up in 3 months for next A1c

## 2017-06-20 NOTE — Assessment & Plan Note (Signed)
No red flags. Continue current management. Pain meds refilled x3 months with postdated scripts

## 2017-06-20 NOTE — Progress Notes (Signed)
   CC: Chronic illness follow up  HPI: Michelle [name] is a 66 y.o. female with PMH significant for diabetes, restrictive lung disease followed by pulmonary, GERD, chronic pain secondary to rheumatoid arthritis who presents to [facility] today for chronic pain medications and follow-up of diabetes.   DIABETES Disease Monitoring: A1c 7.2 from 8.1 Blood Sugar ranges-109, 130-140  Polyuria/phagia/dipsia- denies       Visual problems- denies Medications: metformin 1000 mg BID, onglyza (saxagliptin) 5 mg Compliance- 100% reported  Hypoglycemic symptoms- none; she has had in the past and knows what to watch out for.  CHRONIC PAIN Indication for chronic opioid: rheumatoid arthritis pain Medication and dose: Percocet 10-325, 1 tablet every 6 hours # pills per month: 120, fill 3 months at a time Patient reports that chronic pain is well controlled on this regimen. She reports that she takes medication for the much scheduled, will sometimes wait more than 6 hours if she has no pain.  Monitoring Labs and Parameters Last A1C:  Lab Results  Component Value Date   HGBA1C 7.2 06/20/2017    Last BPs:  BP Readings from Last 3 Encounters:  06/20/17 122/60  05/28/17 134/80  04/25/17 124/60   Review of Symptoms:  See HPI for ROS.   CC, SH/smoking status, and VS noted.  Objective: BP 122/60   Pulse 95   Temp 97.9 F (36.6 C) (Oral)   Ht 5\' 3"  (1.6 m)   Wt 155 lb 3.2 oz (70.4 kg)   SpO2 94%   BMI 27.49 kg/m  GEN: NAD, alert, cooperative, and pleasant. RESPIRATORY: +soft inspiratory wheeze, rhonchi or rales, good effort, comfortable work of breathing CV: RRR, no m/r/g, no peripheral edema GI: soft, non-tender, non-distended, no hepatosplenomegaly  Assessment and plan:  Diabetes mellitus without complication (HCC) Z3Y is improved from previous visit. Patient reports that she has been focusing on diet. - Continue current medication regimen - Encourage continue focus on diet - Follow-up  in 3 months for next A1c  Encounter for chronic pain management No red flags. Continue current management. Pain meds refilled x3 months with postdated scripts   Orders Placed This Encounter  Procedures  . HgB A1c    Meds ordered this encounter  Medications  . DISCONTD: oxyCODONE-acetaminophen (PERCOCET) 10-325 MG tablet    Sig: Take 1 tablet by mouth every 6 (six) hours as needed for pain.    Dispense:  120 tablet    Refill:  0    Fill on or after 06/25/2017  . DISCONTD: oxyCODONE-acetaminophen (PERCOCET) 10-325 MG tablet    Sig: Take 1 tablet by mouth every 6 (six) hours as needed for pain.    Dispense:  120 tablet    Refill:  0    Fill on or after 07/26/2016  . DISCONTD: oxyCODONE-acetaminophen (PERCOCET) 10-325 MG tablet    Sig: Take 1 tablet by mouth every 6 (six) hours as needed for pain.    Dispense:  120 tablet    Refill:  0    Fill on or after 07/26/2017  . oxyCODONE-acetaminophen (PERCOCET) 10-325 MG tablet    Sig: Take 1 tablet by mouth every 6 (six) hours as needed for pain.    Dispense:  120 tablet    Refill:  0    Fill on or after 08/26/2017     Everrett Coombe, MD,MS,  PGY2 06/20/2017 1:58 PM

## 2017-07-07 ENCOUNTER — Telehealth: Payer: Self-pay | Admitting: Student in an Organized Health Care Education/Training Program

## 2017-07-07 ENCOUNTER — Other Ambulatory Visit: Payer: Self-pay | Admitting: Student in an Organized Health Care Education/Training Program

## 2017-07-07 DIAGNOSIS — Z1231 Encounter for screening mammogram for malignant neoplasm of breast: Secondary | ICD-10-CM

## 2017-07-07 NOTE — Telephone Encounter (Signed)
Pt wants to be taken off losartan and put on something else in the place of it. Pt has seen a commercial saying losartan has cancer causing agents in it.

## 2017-07-09 NOTE — Telephone Encounter (Signed)
Patient should call her pharmacy to get specifics on the recall.  From what I can tell, there was a recall on losartan from one distributor in particular called [employer]. Typically if there is a recall the pharmacy will reach out to patients involved, but she can call and ask her pharmacist.

## 2017-07-09 NOTE — Telephone Encounter (Signed)
LM with female for pt to call back to inform her of below. Katharina Caper, Presly Steinruck D, [place]

## 2017-07-18 DIAGNOSIS — R3915 Urgency of urination: Secondary | ICD-10-CM | POA: Diagnosis not present

## 2017-07-18 DIAGNOSIS — N3941 Urge incontinence: Secondary | ICD-10-CM | POA: Diagnosis not present

## 2017-07-18 DIAGNOSIS — N3000 Acute cystitis without hematuria: Secondary | ICD-10-CM | POA: Diagnosis not present

## 2017-07-22 ENCOUNTER — Telehealth: Payer: Self-pay | Admitting: Student in an Organized Health Care Education/Training Program

## 2017-07-22 NOTE — Telephone Encounter (Signed)
Patient needs an appointment to be seen. Thank you.

## 2017-07-22 NOTE — Telephone Encounter (Signed)
Informed pt of below and scheduled pt in same day slot with PCP for tomorrow, routing to PCP as an FYI. Katharina Caper, April D, [place]

## 2017-07-22 NOTE — Telephone Encounter (Signed)
Pt thinks her iron is down again and thinks she needs another infusion. She is just laying on couch all the time and has no energy to do anything anymore. She is also craving ice all the time again.

## 2017-07-23 ENCOUNTER — Ambulatory Visit (INDEPENDENT_AMBULATORY_CARE_PROVIDER_SITE_OTHER): Payer: Medicare Other | Admitting: Student in an Organized Health Care Education/Training Program

## 2017-07-23 ENCOUNTER — Encounter: Payer: Self-pay | Admitting: Student in an Organized Health Care Education/Training Program

## 2017-07-23 ENCOUNTER — Other Ambulatory Visit: Payer: Self-pay

## 2017-07-23 VITALS — BP 114/64 | HR 90 | Temp 98.0°F | Ht 63.0 in | Wt 156.4 lb

## 2017-07-23 DIAGNOSIS — R42 Dizziness and giddiness: Secondary | ICD-10-CM | POA: Diagnosis present

## 2017-07-23 NOTE — Progress Notes (Signed)
   CC: dizziness  HPI: Michelle [name] is a 67 y.o. female presenting with dizziness.   Patient reports that she was diagnosed with a UTI and urology and started medication yesterday with antibiotics. She reports that over the past 2-3 days if she is walking and turns suddenly or goes from sitting to standing, she will feel a little light headed. She has not fallen down or passed out. She additionally endorses her hands have been cold. She has not had chest pain, palpitations, or dyspnea over the past 2-3 days. She additionally endorses having diarrhea consistent with previous IBS symptoms.    She asks for medication for yeast infection. She has not started to have one yet, but feels confident she will develop one with antibiotics. She agrees to wait until she is symptomatic and call in so that medication may be prescribed at the appropriate time, not preemptively.  Review of Symptoms:  See HPI for ROS.   CC, SH/smoking status, and VS noted.  Objective: BP 114/64   Pulse 90   Temp 98 F (36.7 C) (Oral)   Ht 5\' 3"  (1.6 m)   Wt 156 lb 6.4 oz (70.9 kg)   SpO2 98%   BMI 27.71 kg/m  GEN: NAD, alert, cooperative, and pleasant. EYE: no conjunctival injection, pupils equally round and reactive to light ENMT: normal tympanic light reflex, no nasal polyps,no rhinorrhea, no pharyngeal erythema or exudates NECK: full ROM, no thyromegaly RESPIRATORY: clear to auscultation bilaterally with no wheezes, rhonchi or rales, good effort CV: RRR, no m/r/g, no peripheral edema, cap refill >3s GI: soft, non-tender, non-distended, no hepatosplenomegaly SKIN: warm and dry, no rashes or lesions NEURO: II-XII grossly intact, normal gait, peripheral sensation intact PSYCH: AAOx3, appropriate affect  Assessment and plan:  Dizziness Likely secondary to orthostatic hypotension. Orthostatics in the office were positive. Patient has been having diarrhea with IBS symptoms. UTI may also be contributory. No red  flag symptoms suggesting cardiac etiology at this time. - encourage hydration - patient to keep bedside water to drink before getting out of bed to prevent AM dizziness - if symptoms persist beyond 5-7 days, patient to follow up - return precautions discussed  Everrett Coombe, MD,MS,  PGY2 07/23/2017 3:47 PM

## 2017-07-23 NOTE — Assessment & Plan Note (Addendum)
Likely secondary to orthostatic hypotension. Orthostatics in the office were positive. Patient has been having diarrhea with IBS symptoms. UTI may also be contributory. No red flag symptoms suggesting cardiac etiology at this time. - encourage hydration - patient to keep bedside water to drink before getting out of bed to prevent AM dizziness - if symptoms persist beyond 5-7 days, patient to follow up - return precautions discussed

## 2017-07-23 NOTE — Patient Instructions (Signed)
It was a pleasure seeing you today in our clinic. Today we discussed your dizziness. Here is the treatment plan we have discussed and agreed upon together:  Please continue to stay hydrated at home by drinking water throughout the day. This is particularly important when you have a UTI.  If your symptoms worsen or persist over the next week, please give our office a call for a follow up appointment.  Our clinic's number is [phone]. Please call with questions or concerns about what we discussed today.  Be well, Dr. Burr Medico

## 2017-07-28 ENCOUNTER — Telehealth: Payer: Self-pay | Admitting: Student in an Organized Health Care Education/Training Program

## 2017-07-28 NOTE — Telephone Encounter (Signed)
Did get a yeast infection.  Please call in diflucan to [facility] on [address].

## 2017-07-29 ENCOUNTER — Other Ambulatory Visit: Payer: Self-pay | Admitting: Student in an Organized Health Care Education/Training Program

## 2017-07-29 DIAGNOSIS — B379 Candidiasis, unspecified: Secondary | ICD-10-CM

## 2017-07-29 DIAGNOSIS — R3915 Urgency of urination: Secondary | ICD-10-CM | POA: Diagnosis not present

## 2017-07-29 DIAGNOSIS — B373 Candidiasis of vulva and vagina: Secondary | ICD-10-CM | POA: Diagnosis not present

## 2017-07-29 DIAGNOSIS — N3941 Urge incontinence: Secondary | ICD-10-CM | POA: Diagnosis not present

## 2017-07-29 MED ORDER — FLUCONAZOLE 150 MG PO TABS: 150.0000 mg | ORAL_TABLET | Freq: Once | ORAL | 0 refills | Status: AC

## 2017-07-29 NOTE — Progress Notes (Signed)
Diflucan called into pharmacy ?

## 2017-08-04 ENCOUNTER — Other Ambulatory Visit: Payer: Self-pay | Admitting: Family Medicine

## 2017-08-05 ENCOUNTER — Telehealth: Payer: Self-pay | Admitting: Student in an Organized Health Care Education/Training Program

## 2017-08-05 ENCOUNTER — Other Ambulatory Visit: Payer: Self-pay | Admitting: Family Medicine

## 2017-08-05 DIAGNOSIS — N3941 Urge incontinence: Secondary | ICD-10-CM | POA: Diagnosis not present

## 2017-08-05 DIAGNOSIS — R3915 Urgency of urination: Secondary | ICD-10-CM | POA: Diagnosis not present

## 2017-08-05 NOTE — Telephone Encounter (Signed)
Pt needs refill on her omeprazole, the pharmacy has been sending request to her old PCP. She would like this refill sent to [facility] on [address]. She also wanted me to tell the Burr Medico that the pharmacy is only allowing her to refill her Ventolin for only 1 month at a time instead of a 3 month supply at a time, which is costing a lot more. She doesn't know why they are starting doing this. Please advise

## 2017-08-06 ENCOUNTER — Other Ambulatory Visit: Payer: Self-pay | Admitting: Family Medicine

## 2017-08-06 NOTE — Telephone Encounter (Signed)
Forwarding to PCP.

## 2017-08-07 DIAGNOSIS — M153 Secondary multiple arthritis: Secondary | ICD-10-CM | POA: Diagnosis not present

## 2017-08-07 DIAGNOSIS — Z79899 Other long term (current) drug therapy: Secondary | ICD-10-CM | POA: Diagnosis not present

## 2017-08-07 DIAGNOSIS — M7062 Trochanteric bursitis, left hip: Secondary | ICD-10-CM | POA: Diagnosis not present

## 2017-08-07 DIAGNOSIS — M059 Rheumatoid arthritis with rheumatoid factor, unspecified: Secondary | ICD-10-CM | POA: Diagnosis not present

## 2017-08-07 DIAGNOSIS — M7061 Trochanteric bursitis, right hip: Secondary | ICD-10-CM | POA: Diagnosis not present

## 2017-08-08 ENCOUNTER — Ambulatory Visit
Admission: RE | Admit: 2017-08-08 | Discharge: 2017-08-08 | Disposition: A | Discharge status: Home / Self Care | Payer: Medicare Other | Source: Ambulatory Visit | Attending: *Deleted | Admitting: *Deleted

## 2017-08-08 DIAGNOSIS — Z1231 Encounter for screening mammogram for malignant neoplasm of breast: Secondary | ICD-10-CM | POA: Diagnosis not present

## 2017-08-12 DIAGNOSIS — N3941 Urge incontinence: Secondary | ICD-10-CM | POA: Diagnosis not present

## 2017-08-12 DIAGNOSIS — R3915 Urgency of urination: Secondary | ICD-10-CM | POA: Diagnosis not present

## 2017-08-19 DIAGNOSIS — R3915 Urgency of urination: Secondary | ICD-10-CM | POA: Diagnosis not present

## 2017-08-19 DIAGNOSIS — N3941 Urge incontinence: Secondary | ICD-10-CM | POA: Diagnosis not present

## 2017-08-26 DIAGNOSIS — N3941 Urge incontinence: Secondary | ICD-10-CM | POA: Diagnosis not present

## 2017-08-26 DIAGNOSIS — R3915 Urgency of urination: Secondary | ICD-10-CM | POA: Diagnosis not present

## 2017-08-28 NOTE — Progress Notes (Signed)
Subjective:   Michelle [name] was seen in consultation in the movement disorder clinic at the request of Everrett Coombe, MD.  The evaluation is for tremor.  She is accompanied by her granddaughter who supplements the history.  Pt is a 67 y.o. female with hx of chronic pain, RA, anxiety, DM who presents with c/o tremor.  Tremor started approximately a year ago and involves the L hand.  She notes it when carrying something like a plate.  Pt was on prednisone for RA recently but tremor is independent of the prednisone.     Specific Symptoms:  Tremor: Yes.   Family hx of similar:  Yes.  , both mother and sister had tremor but no known dx Voice: "everyone says I talk too loud" Sleep: multiple awakenings to use the BR; she is sleepy during the day  Vivid Dreams:  No.  Acting out dreams:  rarely Wet Pillows: No. Postural symptoms:  Yes.    Falls?  No. but had near falls Bradykinesia symptoms: slow movements, drooling while awake and difficulty getting out of a chair Loss of smell:  No., better after septoplasty Loss of taste:  No. Urinary Incontinence:  Yes.  , urinary urgency and frequency Difficulty Swallowing:  No. Handwriting, micrographia: No. Trouble with ADL's:  No.  Trouble buttoning clothing: Yes.  , but because of RA Depression:  Yes.   Memory changes:  Yes.   Hallucinations:  No.  visual distortions: Yes.   N/V:  Yes.   (emesis with ice cream but attributes to GERD) Lightheaded:  Yes.    Syncope: No. Diplopia:  No. Dyskinesia:  No.  Neuroimaging has previously been performed.  It is available for my review today.  MRI done in 01/2013 Demonstrated scattered T2 hyperintensities, overall mild, and a developmental venous anomaly in the left frontal operculum.  05/28/17 update:  Pt seen in one year follow-up today. Tremor has been worse in the L hand and she is noting some chin/lip tremor.   I have reviewed records available to me since last visit.  She had a low B12 level done in  September. She has been following at [facility] for her cough variant asthma.  She states that she is worried about lung CA.    08/29/17 update: Patient is seen today in follow-up for essential tremor.  She is now on primidone, 50 mg daily.  She reports that tremor is good in the AM but wears off in the afternoon.  She will nearly throw it off of the plate.  Use of albuterol is variable but she doesn't think that it affects tremor.  She also c/o headache in the bilateral temporal region, pounding in quality.  Comes daily.  Better in the AM, worse in the afternoon.  Going on x 3 weeks.  No new medication in the last month.  Tried ibuprofen, tylenol, OTC "migraine" meds without relief.  Uses percocet every 6 hours from RA.  B12 was a little low and now on 1032mcg daily.  Records have been reviewed since our last visit.  She has been seen at [facility] rheumatology for rheumatoid arthritis.  She has been seen by family medicine.  Her last A1c in December, 2018 was 7.2.   ALLERGIES:   Allergies  Allergen Reactions  . Ace Inhibitors Cough    Occurred with liinopril  . Sulfonamide Derivatives Hives and Itching    CURRENT MEDICATIONS:  Outpatient Encounter Medications as of 08/29/2017  Medication Sig  . albuterol (VENTOLIN HFA)  108 (90 Base) MCG/ACT inhaler INHALE TWO PUFFS BY MOUTH EVERY 4 HOURS AS NEEDED FOR WHEEZING FOR SHORTNESS OF BREATH  . aspirin EC 81 MG tablet Take 81 mg by mouth daily.  . cetirizine (ZYRTEC) 10 MG tablet TAKE ONE TABLET BY MOUTH ONCE DAILY  . diclofenac sodium (VOLTAREN) 1 % GEL Apply 2 g topically 4 (four) times daily as needed (for pain).  . fluconazole (DIFLUCAN) 150 MG tablet   . fluticasone (FLONASE) 50 MCG/ACT nasal spray Place 2 sprays into both nostrils daily.  Marland Kitchen gabapentin (NEURONTIN) 300 MG capsule Take 300 mg by mouth at bedtime.  Marland Kitchen glucose blood (ACCU-CHEK AVIVA PLUS) test strip Use to test blood sugar four times daily as needed. ICD-10 code: E11.9  . hydrOXYzine  (ATARAX/VISTARIL) 25 MG tablet Take 25 mg by mouth as needed.  . inFLIXimab (REMICADE) 100 MG injection Inject into the vein.  Marland Kitchen leflunomide (ARAVA) 10 MG tablet Take 10 mg by mouth daily.  Marland Kitchen losartan (COZAAR) 25 MG tablet Take 1 tablet (25 mg total) by mouth daily.  . metFORMIN (GLUCOPHAGE) 1000 MG tablet Take 1 tablet (1,000 mg total) by mouth 2 (two) times daily with a meal.  . nitroGLYCERIN (NITROSTAT) 0.4 MG SL tablet Place 0.4 mg under the tongue every 5 (five) minutes as needed.    Marland Kitchen omeprazole (PRILOSEC) 40 MG capsule TAKE 1 CAPSULE BY MOUTH TWICE DAILY  . ONGLYZA 5 MG TABS tablet Take 5 mg by mouth daily.  Marland Kitchen oxyCODONE-acetaminophen (PERCOCET) 10-325 MG tablet Take 1 tablet by mouth every 6 (six) hours as needed for pain.  . primidone (MYSOLINE) 50 MG tablet Take 1 tablet (50 mg total) by mouth at bedtime.  . rosuvastatin (CRESTOR) 10 MG tablet TAKE ONE TABLET BY MOUTH ONCE DAILY  . sertraline (ZOLOFT) 50 MG tablet TAKE ONE TABLET BY MOUTH ONCE DAILY  . triamcinolone (KENALOG) 0.1 % paste USE AS DIRECTED IN  MOUTH/THROAT  TO  THE  LESION  TWICE  DAILY  . [DISCONTINUED] Mometasone Furoate 200 MCG/ACT AERO Inhale 1 puff 2 (two) times daily into the lungs.  . [DISCONTINUED] neomycin-bacitracin-polymyxin (NEOSPORIN) ointment Apply topically.   No facility-administered encounter medications on file as of 08/29/2017.     PAST MEDICAL HISTORY:   Past Medical History:  Diagnosis Date  . Allergy   . Anxiety   . Asthma   . Cataract    beginning on left eye  . Diabetes mellitus   . Diverticulosis   . GERD (gastroesophageal reflux disease)   . HLD (hyperlipidemia)   . Lung nodules   . Osteoporosis   . RA (rheumatoid arthritis) ([place])    CCP positive followed by [facility]    PAST SURGICAL HISTORY:   Past Surgical History:  Procedure Laterality Date  . ABDOMINAL HYSTERECTOMY    . APPENDECTOMY    . CARPAL TUNNEL RELEASE    . CATARACT EXTRACTION Left    with lid lift  . CHOLECYSTECTOMY      . HERNIA REPAIR    . KNEE SURGERY     x2  . LEFT AND RIGHT HEART CATHETERIZATION WITH CORONARY ANGIOGRAM N/A 08/17/2013   Procedure: LEFT AND RIGHT HEART CATHETERIZATION WITH CORONARY ANGIOGRAM;  Surgeon: Laverda Page, MD;  Location: [facility] CATH LAB;  Service: Cardiovascular;  Laterality: N/A;  . right foot bone spurs removed    . SHOULDER SURGERY     x2    SOCIAL HISTORY:   Social History   Socioeconomic History  . Marital status:  Married    Spouse name: Not on file  . Number of children: 3  . Years of education: Not on file  . Highest education level: Not on file  Social Needs  . Financial resource strain: Not on file  . Food insecurity - worry: Not on file  . Food insecurity - inability: Not on file  . Transportation needs - medical: Not on file  . Transportation needs - non-medical: Not on file  Occupational History  . Occupation: disabled    Fish farm manager: UNEMPLOYED  Tobacco Use  . Smoking status: Former Smoker    Packs/day: 1.50    Years: 5.00    Pack years: 7.50    Types: Cigarettes    Last attempt to quit: 05/31/1994    Years since quitting: 23.2  . Smokeless tobacco: Never Used  Substance and Sexual Activity  . Alcohol use: No    Alcohol/week: 0.0 oz  . Drug use: No  . Sexual activity: Not on file  Other Topics Concern  . Not on file  Social History Narrative   Pt is on [employer] [place]   Lots of family stressors including grandaughter who lives with her          FAMILY HISTORY:   Family Status  Relation Name Status  . Father  Deceased  . Mat Aunt  Deceased  . Mother  Deceased  . Brother  (Not Specified)  . Sister  (Not Specified)  . Daughter  (Not Specified)  . Sister  (Not Specified)  . Sister  (Not Specified)  . Neg Hx  (Not Specified)    ROS:  Some hip pain after fall in the snow - told due to bursitis.  A complete 10 system review of systems was obtained and was unremarkable apart from what is mentioned above.  PHYSICAL EXAMINATION:     VITALS:   Vitals:   08/29/17 0752  BP: 126/76  Pulse: 82  SpO2: 96%  Weight: 158 lb (71.7 kg)  Height: 5\' 3"  (1.6 m)    GEN:  The patient appears stated age and is in NAD. HEENT:  Normocephalic, atraumatic.  The mucous membranes are moist. No tenderness or ropiness in the STA CV:  RRR Lungs:  She has expiratory wheezes bilaterally Neck/HEME:  There are no carotid bruits bilaterally.  Neurological examination:  Orientation: The patient is alert and oriented x3.  Cranial nerves: There is good facial symmetry.  Extraocular muscles are intact. The visual fields are full to confrontational testing. The speech is fluent and clear. Soft palate rises symmetrically and there is no tongue deviation. Hearing is intact to conversational tone. Sensation: Sensation is intact to light and pinprick throughout (facial, trunk, extremities). Vibration is intact at the bilateral big toe. There is no extinction with double simultaneous stimulation. There is no sensory dermatomal level identified. Motor: Strength is 5/5 in the bilateral upper and lower extremities.   Shoulder shrug is equal and symmetric.  There is no pronator drift.  Movement examination: Tone: There is normal tone in the bilateral upper extremities.  The tone in the lower extremities is normal.  Abnormal movements: There is no rest tremor.  There is mild tremor of the L hand with intention. Coordination:  There is no decremation, with any form of RAMS, including alternating supination and pronation of the forearm, hand opening and closing, finger taps, heel taps and toe taps. Gait and Station: The patient is able to ambulate without trouble.   LABS  Lab Results  Component Value Date   TSH 2.160 11/26/2016   Lab Results  Component Value Date   VITAMINB12 260 03/26/2017    She had lab work at [facility] on 04/03/2016.  I reviewed those.  Her AST was 23 and ALT 44.  Her CBC was normal.    Chemistry      Component Value Date/Time    NA 138 11/26/2016 0943   K 4.4 11/26/2016 0943   CL 102 11/26/2016 0943   CO2 23 11/26/2016 0943   BUN 18 11/26/2016 0943   CREATININE 0.60 11/26/2016 0943   CREATININE 0.46 (L) 07/22/2016 1026      Component Value Date/Time   CALCIUM 9.1 11/26/2016 0943   ALKPHOS 88 11/26/2016 0943   AST 24 11/26/2016 0943   ALT 24 11/26/2016 0943   BILITOT 0.3 11/26/2016 0943       ASSESSMENT/PLAN:  1.   Essential Tremor.  -I told the patient that this likely represents asymmetric essential tremor, worsened by the medications for her lungs.   Increase primidone 50 mg bid since tremor is worse in the afternoon.  Risks, benefits, side effects and alternative therapies were discussed.  The opportunity to ask questions was given and they were answered to the best of my ability.  The patient expressed understanding and willingness to follow the outlined treatment protocols.  2.  B12 deficiency  -on oral supplements  3. Iron deficiency anemia  -received one iron infusion.  4.  Urinary frequency  -she was tried on myrbetriq but didn't help and couldn't take other meds due to RA.  She has been doing accupuncture at urology office and it has helped.  5.  Headache  -sounds tension.  Going on x 3 weeks.  Will try medrol dose pack. Discussed can increase blood sugars, increase risk of yeast infection and can increase tremor but will be short course. She asks me to send diflucan with that and I will.  Risks, benefits, side effects and alternative therapies were discussed.  The opportunity to ask questions was given and they were answered to the best of my ability.  The patient expressed understanding and willingness to follow the outlined treatment protocols.  6. Follow up is anticipated in the next few months, sooner should new neurologic issues arise.  Much greater than 50% of this visit was spent in counseling and coordinating care.  Total face to face time:  25 min     Cc:  Everrett Coombe, MD

## 2017-08-29 ENCOUNTER — Ambulatory Visit (INDEPENDENT_AMBULATORY_CARE_PROVIDER_SITE_OTHER): Payer: Medicare Other | Admitting: Neurology

## 2017-08-29 ENCOUNTER — Encounter: Payer: Self-pay | Admitting: Neurology

## 2017-08-29 VITALS — BP 126/76 | HR 82 | Ht 63.0 in | Wt 158.0 lb

## 2017-08-29 DIAGNOSIS — E538 Deficiency of other specified B group vitamins: Secondary | ICD-10-CM

## 2017-08-29 DIAGNOSIS — G44209 Tension-type headache, unspecified, not intractable: Secondary | ICD-10-CM

## 2017-08-29 DIAGNOSIS — G250 Essential tremor: Secondary | ICD-10-CM | POA: Diagnosis not present

## 2017-08-29 MED ORDER — METHYLPREDNISOLONE 4 MG PO TBPK: ORAL_TABLET | ORAL | 0 refills | Status: DC

## 2017-08-29 MED ORDER — PRIMIDONE 50 MG PO TABS: 50.0000 mg | ORAL_TABLET | Freq: Two times a day (BID) | ORAL | 1 refills | Status: DC

## 2017-08-29 MED ORDER — FLUCONAZOLE 150 MG PO TABS: ORAL_TABLET | ORAL | 0 refills | Status: DC

## 2017-09-02 DIAGNOSIS — R3915 Urgency of urination: Secondary | ICD-10-CM | POA: Diagnosis not present

## 2017-09-02 DIAGNOSIS — N3941 Urge incontinence: Secondary | ICD-10-CM | POA: Diagnosis not present

## 2017-09-09 DIAGNOSIS — R3915 Urgency of urination: Secondary | ICD-10-CM | POA: Diagnosis not present

## 2017-09-09 DIAGNOSIS — N3941 Urge incontinence: Secondary | ICD-10-CM | POA: Diagnosis not present

## 2017-09-10 ENCOUNTER — Encounter: Payer: Self-pay | Admitting: Internal Medicine

## 2017-09-10 ENCOUNTER — Other Ambulatory Visit: Payer: Self-pay

## 2017-09-10 ENCOUNTER — Ambulatory Visit (INDEPENDENT_AMBULATORY_CARE_PROVIDER_SITE_OTHER): Payer: Medicare Other | Admitting: Internal Medicine

## 2017-09-10 VITALS — BP 115/60 | HR 92 | Temp 98.8°F | Ht 63.0 in | Wt 155.2 lb

## 2017-09-10 DIAGNOSIS — R21 Rash and other nonspecific skin eruption: Secondary | ICD-10-CM

## 2017-09-10 DIAGNOSIS — M25552 Pain in left hip: Secondary | ICD-10-CM | POA: Diagnosis present

## 2017-09-10 MED ORDER — HYDROCORTISONE 1 % EX OINT: 1.0000 "application " | TOPICAL_OINTMENT | Freq: Two times a day (BID) | CUTANEOUS | 0 refills | Status: DC

## 2017-09-10 NOTE — Progress Notes (Signed)
Date of Visit: 09/10/2017   [facility] Progress Note  Subjective:  Michelle [name] is a 67 y.o. female with history of RA, T2DM, HTN, asthma, and osteoporosis who presents for possible bite on forehead and ongoing left hip pain.    Skin bump: -Patient first noticed itchy red area on center of forehead on Monday morning (3/11) -She does not recall being bitten by a bug or hitting her head in this area, but she spent most of Sunday helping family clean the garage -She feels like the area of redness has increased slightly over the past 2 days  -Has not tried anything on region -Occasionally has felt wetness ROS: No fevers  Hip pain (left): -Slipped on ice and fell in December 2018; landed on left side -Left-sided hip pain over greater trochanter which has gradually worsened over the past 2-3 months -Pain is exacerbated by standing or walking; at rest, pain severity is 4/10; after 2-3 minutes of standing/walking, it is 8-9/10; patient has been sitting most of the time since injury -Has not had any recent imaging -Pain is not relieved by heat pads, hot baths, OTC pain medication (NSAIDs, Tylenol), or Percocet prescribed for RA -Pain interferes with patient's ability to go to the grocery store and do her usual activities around the house -Recently took steroid course for RA with no improvement of hip pain -Has been told she has right hip bursitis in the past but has not had problems with left hip before -Denies numbness/tingling into left leg ROS: No loss of bowel/bladder incontinence (has long-standing urinary urge incontinence followed by Urology)   Allergies  Allergen Reactions  . Ace Inhibitors Cough    Occurred with liinopril  . Sulfonamide Derivatives Hives and Itching    Social History   Tobacco Use  . Smoking status: Former Smoker    Packs/day: 1.50    Years: 5.00    Pack years: 7.50    Types: Cigarettes    Last attempt to quit: 05/31/1994    Years  since quitting: 23.2  . Smokeless tobacco: Never Used  Substance Use Topics  . Alcohol use: No    Alcohol/week: 0.0 oz    Objective: Blood pressure 115/60, pulse 92, temperature 98.8 F (37.1 C), temperature source Oral, height 5\' 3"  (1.6 m), weight 155 lb 3.2 oz (70.4 kg), SpO2 96 %. Body mass index is 27.49 kg/m. Constitutional: Overweight female in NAD Cardiovascular: RRR, S1, S2, no m/r/g.  Pulmonary/Chest: Effort normal and breath sounds normal.  Musculoskeletal: Somewhat antalgic gait. Pain with internal > external rotation of left hip and TTP over left greater trochanter. No pain with internal or external rotation of right hip. Hip flexion normal bilaterally. Mild TTP over SI joints. LE strength 5-/5 on left limited by pain.  Neurological: Patellar reflexes 1+ bilaterally. Negative straight leg raise. Peripheral sensation intact.  Skin: ~2cmx3cm slightly erythematous patch of mid-forehead into hairline; no scaling; small papule within hairline without drainage Psychiatric: Somewhat anxious.  Vitals reviewed  Assessment/Plan: Left hip pain - Concern for fracture after fall. Could be trochanteric bursitis but would have anticipated some improvement after recent PO steroid burst. Strength of hip flexors intact and walking without assistance, so think unlikely muscle or ligamentous injury. Could be AVN with history of steroid use for RA. - Recommended left hip x-ray. Will determine plan from there based on imaging. If fracture noted or nothing seen and MRI needed, would recommend patient follow-up with [facility] where she has followed for  orthopedic issues in the past.   Rash and nonspecific skin eruption - Suspect contact dermatitis vs mild folliculitis given papule in hairline. Known allergy to sulfa products. No fevers and area of redness has remained relatively stable so unlikely cellulitis. - Recommended hydrocortisone cream for itching. Patient has hydroxyzine at  home, as well.  - Return if no improvement or spreading.   Follow-up pending imaging results.  Olene Floss, MD [facility], PGY-3  Note completed with [facility] Student Michelle [name].

## 2017-09-10 NOTE — Progress Notes (Deleted)
Date of Visit: 09/10/2017   HPI:  Bug bite: -Patient first noticed pruritic, red area on center of forehead on Monday morning (3/11) -She does not recall being bitten by a bug or hitting her head in this area, but she spend most of Sunday helping family clean the garage -She feels like the area of redness has increased slightly over the past 2 days   Hip pain (left): -Slipped on ice and fell in December 2018; landed on left side -Left-sided hip pain over greater trochanter which has gradually worsened over the past 2-3 months -Pain is exacerbated by standing or walking; at rest, pain severity is 4/10; after 2-3 minutes of standing/walking, it is 8-9/10 -Pain is not relieved by heat pads, hot baths, OTC pain medication (NSAIDs, Tylenol), or Percocet prescribed for RA -Pain interferes with patient's ability to go to the grocery store and do her usual activities around the house   ROS: See HPI.  [place]: ***  PHYSICAL EXAM: BP 115/60 (BP Location: Left Arm, Patient Position: Sitting, Cuff Size: Normal)   Pulse 92   Temp 98.8 F (37.1 C) (Oral)   Ht 5\' 3"  (1.6 m)   Wt 155 lb 3.2 oz (70.4 kg)   SpO2 96%   BMI 27.49 kg/m  Gen: *** HEENT: *** Heart: *** Lungs: *** Neuro: *** Ext: ***  ASSESSMENT/PLAN:  Health maintenance:  -***  No problem-specific Assessment & Plan notes found for this encounter.  FOLLOW UP: Follow up in *** for ***

## 2017-09-11 ENCOUNTER — Ambulatory Visit
Admission: RE | Admit: 2017-09-11 | Discharge: 2017-09-11 | Disposition: A | Discharge status: Home / Self Care | Payer: Medicare Other | Source: Ambulatory Visit | Attending: Family Medicine | Admitting: Family Medicine

## 2017-09-11 ENCOUNTER — Encounter: Payer: Self-pay | Admitting: Internal Medicine

## 2017-09-11 DIAGNOSIS — M25552 Pain in left hip: Secondary | ICD-10-CM | POA: Diagnosis not present

## 2017-09-11 NOTE — Assessment & Plan Note (Signed)
-   Concern for fracture after fall. Could be trochanteric bursitis but would have anticipated some improvement after recent PO steroid burst. Strength of hip flexors intact and walking without assistance, so think unlikely muscle or ligamentous injury. Could be AVN with history of steroid use for RA. - Recommended left hip x-ray. Will determine plan from there based on imaging. If fracture noted or nothing seen and MRI needed, would recommend patient follow-up with [facility] where she has followed for orthopedic issues in the past.

## 2017-09-11 NOTE — Assessment & Plan Note (Signed)
-   Suspect contact dermatitis vs mild folliculitis given papule in hairline. Known allergy to sulfa products. No fevers and area of redness has remained relatively stable so unlikely cellulitis. - Recommended hydrocortisone cream for itching. Patient has hydroxyzine at home, as well.  - Return if no improvement or spreading.

## 2017-09-11 NOTE — Patient Instructions (Signed)
Please get hip x-ray done. I will call with results.  Try hydrocortisone for bump on forehead.

## 2017-09-16 ENCOUNTER — Telehealth: Payer: Self-pay

## 2017-09-16 DIAGNOSIS — R3915 Urgency of urination: Secondary | ICD-10-CM | POA: Diagnosis not present

## 2017-09-16 DIAGNOSIS — N3941 Urge incontinence: Secondary | ICD-10-CM | POA: Diagnosis not present

## 2017-09-16 DIAGNOSIS — M7062 Trochanteric bursitis, left hip: Secondary | ICD-10-CM | POA: Diagnosis not present

## 2017-09-16 NOTE — Telephone Encounter (Signed)
Ann from [employer] regarding patients referral- needs Copywriter, advertising. Her call back info [phone] Wallace Cullens, RN

## 2017-09-22 ENCOUNTER — Encounter: Payer: Self-pay | Admitting: Student in an Organized Health Care Education/Training Program

## 2017-09-22 DIAGNOSIS — J45909 Unspecified asthma, uncomplicated: Secondary | ICD-10-CM | POA: Diagnosis not present

## 2017-09-23 DIAGNOSIS — N3941 Urge incontinence: Secondary | ICD-10-CM | POA: Diagnosis not present

## 2017-09-23 DIAGNOSIS — R3915 Urgency of urination: Secondary | ICD-10-CM | POA: Diagnosis not present

## 2017-09-24 ENCOUNTER — Encounter: Payer: Self-pay | Admitting: Student in an Organized Health Care Education/Training Program

## 2017-09-24 ENCOUNTER — Ambulatory Visit (INDEPENDENT_AMBULATORY_CARE_PROVIDER_SITE_OTHER): Payer: Medicare Other | Admitting: Student in an Organized Health Care Education/Training Program

## 2017-09-24 ENCOUNTER — Other Ambulatory Visit: Payer: Self-pay

## 2017-09-24 VITALS — BP 120/72 | HR 84 | Temp 98.8°F | Ht 63.0 in | Wt 156.4 lb

## 2017-09-24 DIAGNOSIS — E119 Type 2 diabetes mellitus without complications: Secondary | ICD-10-CM

## 2017-09-24 DIAGNOSIS — J45909 Unspecified asthma, uncomplicated: Secondary | ICD-10-CM

## 2017-09-24 DIAGNOSIS — G8929 Other chronic pain: Secondary | ICD-10-CM | POA: Diagnosis not present

## 2017-09-24 DIAGNOSIS — G894 Chronic pain syndrome: Secondary | ICD-10-CM

## 2017-09-24 LAB — POCT GLYCOSYLATED HEMOGLOBIN (HGB A1C): Hemoglobin A1C: 7.6

## 2017-09-24 MED ORDER — OXYCODONE-ACETAMINOPHEN 10-325 MG PO TABS: 1.0000 | ORAL_TABLET | Freq: Four times a day (QID) | ORAL | 0 refills | Status: DC | PRN

## 2017-09-24 MED ORDER — GABAPENTIN 300 MG PO CAPS: 300.0000 mg | ORAL_CAPSULE | Freq: Every day | ORAL | 3 refills | Status: DC

## 2017-09-24 MED ORDER — ALBUTEROL SULFATE HFA 108 (90 BASE) MCG/ACT IN AERS: INHALATION_SPRAY | RESPIRATORY_TRACT | 1 refills | Status: DC

## 2017-09-24 MED ORDER — GLUCOSE BLOOD VI STRP: ORAL_STRIP | 2 refills | Status: DC

## 2017-09-24 MED ORDER — ONGLYZA 5 MG PO TABS: 5.0000 mg | ORAL_TABLET | Freq: Every day | ORAL | 0 refills | Status: DC

## 2017-09-24 NOTE — Assessment & Plan Note (Signed)
Current pain management plan is working for the patient. Encouraged trying to space medications. Her pain is from rheumatoid arthritis. We are not currently cutting down on opioids. - oxyCODONE-acetaminophen (PERCOCET) 10-325 MG tablet; Take 1 tablet by mouth every 6 (six) hours as needed for pain.  Dispense: 120 tablet; Refill: 0 - filled x3 months on separate scripts, follow up in 3 months for next refill.

## 2017-09-24 NOTE — Patient Instructions (Signed)
It was a pleasure seeing you today in our clinic. Today we discussed diabetes and chronic pain. Here is the treatment plan we have discussed and agreed upon together:  Diabetes Your diabetes is less controlled than last time. HgbA1c 7.6 Your goal is to have an A1c < 7.0 Medicine Changes: No changes Homework: Work on diet and exercise  Call us if: You experience any low sugars   Come back to see [place] in: 12 weeks Our clinic's number is [phone]. Please call with questions or concerns about what we discussed today.  Be well, Dr. Burr Medico

## 2017-09-24 NOTE — Assessment & Plan Note (Addendum)
Patient's A1c worsened at 7.6 from previous 7.2. She believes this is in the context of worsened dietary habits and also taking a course of steroids.  - plan to continue current med management - recheck A1c in 3 months (~June 27th) - if A1c worsened, she would like to consider trying trulicity because this has worked for a family member - diet/lifestyle discussed - POCT glycosylated hemoglobin (Hb A1C) - ONGLYZA 5 MG TABS tablet; Take 1 tablet (5 mg total) by mouth daily.  Dispense: 90 tablet; Refill: 0 - glucose blood (ACCU-CHEK AVIVA PLUS) test strip; Use to test blood sugar four times daily as needed. ICD-10 code: E11.9  Dispense: 400 each; Refill: 2

## 2017-09-24 NOTE — Progress Notes (Signed)
Subjective:    Michelle [name] - 67 y.o. female MRN [id number]  Date of birth: June 11, 1951  HPI  Michelle [name] is here for diabetes and chronic pain. She also reports that her left hip pain is somewhat improved after receiving a cortisone injection by ortho, where she was diagnosed with bursitis. She has not been taking NSAIDs but is open to trying these in addition to her chronic pain meds.  DIABETES Disease Monitoring: HbA1c 7.6 from 7.2 on last check Blood Sugar ranges - 130, sometimes   Polyuria/phagia/dipsia- +urinary frequency (follows with urology weekly for injections),  +polydipsia which is chronic for her      Visual problems- follows with ophthalmology, next appt in May  Medications: metformin 1000 mg BID, onglyza (saxagliptin) 5 mg Compliance- 100%  Hypoglycemic symptoms- no episodes. Knows when her sugars get low because she starts shaking.  Patient reports that was on a course of steroids for one week which may have affected her HbA1c this month.  CHRONIC PAIN Current regimen:  Indication for chronic opioid: rheumatoid arthritis pain Medication and dose: Percocet 10-325, 1 tablet every 6 hours # pills per month: 120, fill 3 months at a time Pain is for rheumatoid arthritis, pain is mostly in hands and knees, based on activity. She is taking it scheduled every 6 hours because it keeps her pain under control.  Monitoring Labs and Parameters Last A1C:  Lab Results  Component Value Date   HGBA1C 7.6 09/24/2017    Last Lipid:     Component Value Date/Time   CHOL 106 07/22/2016 1026   HDL 44 (L) 07/22/2016 1026   LDLDIRECT  08/14/2009 1225    58 (NOTE) ATP III Classification (LDL):      < 100        mg/dL         Optimal     100 - 129     mg/dL         Near or Above Optimal     130 - 159     mg/dL         Borderline High     160 - 189     mg/dL         High      > 190        mg/dL         Very  High     Health Maintenance:  Health Maintenance Due  Topic Date  Due  . PNA vac Low Risk Adult (1 of 2 - PCV13) 04/06/2015    -  reports that she quit smoking about 23 years ago. Her smoking use included cigarettes. She has a 7.50 pack-year smoking history. She has never used smokeless tobacco. - Review of Systems: Per HPI. - Past Medical History: Patient Active Problem List   Diagnosis Date Noted  . Left hip pain 09/11/2017  . Dizziness 07/23/2017  . Urge incontinence of urine 04/18/2017  . Rash and nonspecific skin eruption 11/14/2016  . Hearing loss 07/19/2015  . UTI symptoms 02/13/2015  . Squamous cell carcinoma in situ of skin 11/25/2014  . Encounter for chronic pain management 08/05/2014  . Prolonged Q-T interval on ECG 07/05/2014  . Pulmonary nodules 03/02/2014  . Essential hypertension 10/30/2012  . Irritable bowel 07/13/2012  . Restless legs 05/14/2012  . Gastroparesis 11/19/2011  . Osteoporosis, unspecified 08/08/2011  . Restrictive lung disease 08/09/2010  . Asthma, chronic 03/14/2010  . Rheumatoid arthritis ([place]) 01/08/2010  .  GERD 08/22/2008  . Diabetes mellitus without complication ([place]) 59/56/3875  . Hyperlipidemia 08/28/2006  . OBESITY, NOS 08/28/2006  . Anxiety and depression 08/28/2006   - Medications: reviewed and updated Current Outpatient Medications  Medication Sig Dispense Refill  . albuterol (VENTOLIN HFA) 108 (90 Base) MCG/ACT inhaler INHALE TWO PUFFS BY MOUTH EVERY 4 HOURS AS NEEDED FOR WHEEZING FOR SHORTNESS OF BREATH 6 Inhaler 1  . aspirin EC 81 MG tablet Take 81 mg by mouth daily.    . cetirizine (ZYRTEC) 10 MG tablet TAKE ONE TABLET BY MOUTH ONCE DAILY 90 tablet 3  . diclofenac sodium (VOLTAREN) 1 % GEL Apply 2 g topically 4 (four) times daily as needed (for pain).    . fluconazole (DIFLUCAN) 150 MG tablet One tablet once 1 tablet 0  . fluticasone (FLONASE) 50 MCG/ACT nasal spray Place 2 sprays into both nostrils daily. 48 g 3  . gabapentin (NEURONTIN) 300 MG capsule Take 1 capsule (300 mg total) by mouth at  bedtime. 90 capsule 3  . glucose blood (ACCU-CHEK AVIVA PLUS) test strip Use to test blood sugar four times daily as needed. ICD-10 code: E11.9 400 each 2  . hydrocortisone 1 % ointment Apply 1 application topically 2 (two) times daily. 30 g 0  . hydrOXYzine (ATARAX/VISTARIL) 25 MG tablet Take 25 mg by mouth as needed.    . inFLIXimab (REMICADE) 100 MG injection Inject into the vein.    Marland Kitchen leflunomide (ARAVA) 10 MG tablet Take 10 mg by mouth daily.    Marland Kitchen losartan (COZAAR) 25 MG tablet Take 1 tablet (25 mg total) by mouth daily. 90 tablet 1  . metFORMIN (GLUCOPHAGE) 1000 MG tablet Take 1 tablet (1,000 mg total) by mouth 2 (two) times daily with a meal. 180 tablet 3  . methylPREDNISolone (MEDROL DOSEPAK) 4 MG TBPK tablet As directed 21 tablet 0  . nitroGLYCERIN (NITROSTAT) 0.4 MG SL tablet Place 0.4 mg under the tongue every 5 (five) minutes as needed.      Marland Kitchen omeprazole (PRILOSEC) 40 MG capsule TAKE 1 CAPSULE BY MOUTH TWICE DAILY 180 capsule 3  . ONGLYZA 5 MG TABS tablet Take 1 tablet (5 mg total) by mouth daily. 90 tablet 0  . oxyCODONE-acetaminophen (PERCOCET) 10-325 MG tablet Take 1 tablet by mouth every 6 (six) hours as needed for pain. 120 tablet 0  . primidone (MYSOLINE) 50 MG tablet Take 1 tablet (50 mg total) by mouth 2 (two) times daily. 180 tablet 1  . rosuvastatin (CRESTOR) 10 MG tablet TAKE ONE TABLET BY MOUTH ONCE DAILY 90 tablet 3  . sertraline (ZOLOFT) 50 MG tablet TAKE ONE TABLET BY MOUTH ONCE DAILY 90 tablet 3  . triamcinolone (KENALOG) 0.1 % paste USE AS DIRECTED IN  MOUTH/THROAT  TO  THE  LESION  TWICE  DAILY 5 g 1   No current facility-administered medications for this visit.     Review of Systems See HPI     Objective:   Physical Exam BP 120/72   Pulse 84   Temp 98.8 F (37.1 C) (Oral)   Ht 5\' 3"  (1.6 m)   Wt 156 lb 6.4 oz (70.9 kg)   SpO2 95%   BMI 27.71 kg/m  Gen: NAD, alert, cooperative with exam, well-appearing  HEENT: NCAT, EOMI CV: RRR, good S1/S2, no  murmur, no edema, capillary refill brisk  Resp: CTABL, no wheezes, non-labored Abd: SNTND Skin: no rashes, normal turgor  Neuro: no gross deficits.  Psych: good insight, alert and oriented  Assessment & Plan:   Diabetes mellitus without complication ([place]) Patient's A1c worsened at 7.6 from previous 7.2. She believes this is in the context of worsened dietary habits and also taking a course of steroids.  - plan to continue current med management - recheck A1c in 3 months (~June 27th) - if A1c worsened, she would like to consider trying trulicity because this has worked for a family member - diet/lifestyle discussed - POCT glycosylated hemoglobin (Hb A1C) - ONGLYZA 5 MG TABS tablet; Take 1 tablet (5 mg total) by mouth daily.  Dispense: 90 tablet; Refill: 0 - glucose blood (ACCU-CHEK AVIVA PLUS) test strip; Use to test blood sugar four times daily as needed. ICD-10 code: E11.9  Dispense: 400 each; Refill: 2   Encounter for chronic pain management Current pain management plan is working for the patient. Encouraged trying to space medications. Her pain is from rheumatoid arthritis. We are not currently cutting down on opioids. - oxyCODONE-acetaminophen (PERCOCET) 10-325 MG tablet; Take 1 tablet by mouth every 6 (six) hours as needed for pain.  Dispense: 120 tablet; Refill: 0 - filled x3 months on separate scripts, follow up in 3 months for next refill.   Orders Placed This Encounter  Procedures  . POCT glycosylated hemoglobin (Hb A1C)    Meds ordered this encounter  Medications  . ONGLYZA 5 MG TABS tablet    Sig: Take 1 tablet (5 mg total) by mouth daily.    Dispense:  90 tablet    Refill:  0  . glucose blood (ACCU-CHEK AVIVA PLUS) test strip    Sig: Use to test blood sugar four times daily as needed. ICD-10 code: E11.9    Dispense:  400 each    Refill:  2  . gabapentin (NEURONTIN) 300 MG capsule    Sig: Take 1 capsule (300 mg total) by mouth at bedtime.    Dispense:  90  capsule    Refill:  3  . albuterol (VENTOLIN HFA) 108 (90 Base) MCG/ACT inhaler    Sig: INHALE TWO PUFFS BY MOUTH EVERY 4 HOURS AS NEEDED FOR WHEEZING FOR SHORTNESS OF BREATH    Dispense:  6 Inhaler    Refill:  1  . DISCONTD: oxyCODONE-acetaminophen (PERCOCET) 10-325 MG tablet    Sig: Take 1 tablet by mouth every 6 (six) hours as needed for pain.    Dispense:  120 tablet    Refill:  0  . DISCONTD: oxyCODONE-acetaminophen (PERCOCET) 10-325 MG tablet    Sig: Take 1 tablet by mouth every 6 (six) hours as needed for pain.    Dispense:  120 tablet    Refill:  0    Fill on or after 4/27  . oxyCODONE-acetaminophen (PERCOCET) 10-325 MG tablet    Sig: Take 1 tablet by mouth every 6 (six) hours as needed for pain.    Dispense:  120 tablet    Refill:  0    Fill on or after 5/27    Everrett Coombe, MD,MS,  PGY2 09/24/2017 9:03 AM

## 2017-09-30 DIAGNOSIS — N3941 Urge incontinence: Secondary | ICD-10-CM | POA: Diagnosis not present

## 2017-10-01 DIAGNOSIS — Z23 Encounter for immunization: Secondary | ICD-10-CM | POA: Diagnosis not present

## 2017-10-02 ENCOUNTER — Telehealth: Payer: Self-pay

## 2017-10-02 DIAGNOSIS — M059 Rheumatoid arthritis with rheumatoid factor, unspecified: Secondary | ICD-10-CM | POA: Diagnosis not present

## 2017-10-02 DIAGNOSIS — E119 Type 2 diabetes mellitus without complications: Secondary | ICD-10-CM

## 2017-10-02 MED ORDER — GLUCOSE BLOOD VI STRP: ORAL_STRIP | 3 refills | Status: DC

## 2017-10-02 NOTE — Telephone Encounter (Signed)
I am happy to fill the prescription, but I did change from testing four times per day to once daily.  Medicare typically pays for once daily testing in non insulin treated DM and 3 x per day testing in insulin treated DM

## 2017-10-02 NOTE — Telephone Encounter (Signed)
[facility] could not accept prescription for test strips from PCP. Has to be a PEC[employer] recognized prescription for Medicare. Will route to preceptor to send.  Danley Danker, RN [facility] RN)

## 2017-10-07 ENCOUNTER — Telehealth: Payer: Self-pay | Admitting: Student in an Organized Health Care Education/Training Program

## 2017-10-07 DIAGNOSIS — N3941 Urge incontinence: Secondary | ICD-10-CM | POA: Diagnosis not present

## 2017-10-07 NOTE — Telephone Encounter (Signed)
Pt is calling because she is unsure why her instructions on how many time she is testing her blood sugars has decreased from 4 times a day to 1 time day. Please call to discuss. jw

## 2017-10-08 NOTE — Telephone Encounter (Signed)
Spoke to pt. Pt informed. Patient is going to call Medicaid and see what she can do. Ottis Stain, CMA

## 2017-10-08 NOTE — Telephone Encounter (Signed)
Based on the last note, when the attending refilled her diabetes supplies, insurance would only cover testing supplies for once per day. Medicare typically only covers this amount for non-insulin management of diabetes. It is safe for her to test once daily rather than four times daily.

## 2017-10-14 DIAGNOSIS — R3915 Urgency of urination: Secondary | ICD-10-CM | POA: Diagnosis not present

## 2017-10-14 DIAGNOSIS — N3941 Urge incontinence: Secondary | ICD-10-CM | POA: Diagnosis not present

## 2017-10-14 DIAGNOSIS — M25552 Pain in left hip: Secondary | ICD-10-CM | POA: Diagnosis not present

## 2017-10-16 DIAGNOSIS — Z85828 Personal history of other malignant neoplasm of skin: Secondary | ICD-10-CM | POA: Diagnosis not present

## 2017-10-16 DIAGNOSIS — L570 Actinic keratosis: Secondary | ICD-10-CM | POA: Diagnosis not present

## 2017-10-16 DIAGNOSIS — L820 Inflamed seborrheic keratosis: Secondary | ICD-10-CM | POA: Diagnosis not present

## 2017-10-16 DIAGNOSIS — L578 Other skin changes due to chronic exposure to nonionizing radiation: Secondary | ICD-10-CM | POA: Diagnosis not present

## 2017-10-16 DIAGNOSIS — L309 Dermatitis, unspecified: Secondary | ICD-10-CM | POA: Diagnosis not present

## 2017-10-16 DIAGNOSIS — D1801 Hemangioma of skin and subcutaneous tissue: Secondary | ICD-10-CM | POA: Diagnosis not present

## 2017-10-16 DIAGNOSIS — Z1283 Encounter for screening for malignant neoplasm of skin: Secondary | ICD-10-CM | POA: Diagnosis not present

## 2017-10-16 DIAGNOSIS — L853 Xerosis cutis: Secondary | ICD-10-CM | POA: Diagnosis not present

## 2017-10-16 DIAGNOSIS — L280 Lichen simplex chronicus: Secondary | ICD-10-CM | POA: Diagnosis not present

## 2017-10-16 DIAGNOSIS — L821 Other seborrheic keratosis: Secondary | ICD-10-CM | POA: Diagnosis not present

## 2017-10-16 DIAGNOSIS — L918 Other hypertrophic disorders of the skin: Secondary | ICD-10-CM | POA: Diagnosis not present

## 2017-10-23 DIAGNOSIS — M25552 Pain in left hip: Secondary | ICD-10-CM | POA: Diagnosis not present

## 2017-10-28 DIAGNOSIS — M25552 Pain in left hip: Secondary | ICD-10-CM | POA: Diagnosis not present

## 2017-10-30 ENCOUNTER — Other Ambulatory Visit: Payer: Self-pay

## 2017-10-30 NOTE — Telephone Encounter (Signed)
Patient left message needing refill on Losartan, states pharmacy has been trying to reach [place] x one week.  Also, patient having terrible time with itching, watery eyes and OTC eye drops not helping. Is there anything you can prescribe for this? States her ears are itchy too but it's her eyes that are most troublesome.  Call back [phone] or [phone].  Danley Danker, RN [facility] RN)

## 2017-10-31 MED ORDER — LOSARTAN POTASSIUM 25 MG PO TABS: 25.0000 mg | ORAL_TABLET | Freq: Every day | ORAL | 0 refills | Status: DC

## 2017-10-31 NOTE — Telephone Encounter (Signed)
Refilled with preceptor approval since patient has been out.  Please address patient concern with eye drops. Danley Danker, RN [facility] RN)

## 2017-11-04 DIAGNOSIS — N3941 Urge incontinence: Secondary | ICD-10-CM | POA: Diagnosis not present

## 2017-11-04 DIAGNOSIS — R3915 Urgency of urination: Secondary | ICD-10-CM | POA: Diagnosis not present

## 2017-11-07 ENCOUNTER — Other Ambulatory Visit: Payer: Self-pay

## 2017-11-07 ENCOUNTER — Encounter: Payer: Self-pay | Admitting: Family Medicine

## 2017-11-07 ENCOUNTER — Ambulatory Visit (INDEPENDENT_AMBULATORY_CARE_PROVIDER_SITE_OTHER): Payer: Medicare Other | Admitting: Family Medicine

## 2017-11-07 VITALS — BP 110/64 | HR 95 | Temp 97.7°F | Wt 154.0 lb

## 2017-11-07 DIAGNOSIS — R109 Unspecified abdominal pain: Secondary | ICD-10-CM

## 2017-11-07 LAB — POCT URINALYSIS DIP (MANUAL ENTRY)
BILIRUBIN UA: NEGATIVE mg/dL
Bilirubin, UA: NEGATIVE
Blood, UA: NEGATIVE
Glucose, UA: 100 mg/dL — AB
LEUKOCYTES UA: NEGATIVE
Nitrite, UA: NEGATIVE
PROTEIN UA: NEGATIVE mg/dL
SPEC GRAV UA: 1.02 (ref 1.010–1.025)
Urobilinogen, UA: 0.2 E.U./dL
pH, UA: 5.5 (ref 5.0–8.0)

## 2017-11-07 NOTE — Patient Instructions (Signed)
We will check stat bloodwork.   If your abdominal pain get worse, vomiting, fever (100.29F or higher), blood in your stool. Please go to the emergency room.  Call me on Monday if your abdominal pain is not improved.

## 2017-11-07 NOTE — Progress Notes (Signed)
    Subjective:  Michelle [name] is a 67 y.o. female who presents to the [facility] today with a chief complaint of L abdominal pain.   HPI:  Woke up this morning on sudden onset L lower abdominal pain that she describes as severe. Does have some discomfort with urination but not really a burning. No urinary urgency or frequency.  Has had some diarrhea today. No hematochezia or melena. No nausea or vomiting. Can feel every step she takes because it exacerbates her abdominal pain Sees a urologist for urinary incontinence. States her urinary incontinence is no longer an issue for her but recently had a procedure.   PSH:  Cholecystectomy, appendectomy, hernia repair, hysterectomy,   ROS: Per HPI  Objective:  Physical Exam: BP 110/64   Pulse 95   Temp 97.7 F (36.5 C) (Oral)   Wt 154 lb (69.9 kg)   SpO2 96%   BMI 27.28 kg/m   Gen: NAD, resting comfortably, well appearing CV: RRR with no murmurs appreciated Pulm: NWOB, CTAB with no crackles, wheezes, or rhonchi GI: exquisitely point TTP in LLQ. No rebound. Normal bowel sounds present. Soft, Nondistended. No CVA tenderness MSK: no edema, cyanosis, or clubbing noted Skin: warm, dry Neuro: grossly normal, moves all extremities Psych: Normal affect and thought content  Results for orders placed or performed in visit on 11/07/17 (from the past 72 hour(s))  POCT urinalysis dipstick     Status: Abnormal   Collection Time: 11/07/17  3:41 PM  Result Value Ref Range   Color, UA yellow yellow   Clarity, UA clear clear   Glucose, UA =100 (A) negative mg/dL   Bilirubin, UA negative negative   Ketones, POC UA negative negative mg/dL   Spec Grav, UA 1.020 1.010 - 1.025   Blood, UA negative negative   pH, UA 5.5 5.0 - 8.0   Protein Ur, POC negative negative mg/dL   Urobilinogen, UA 0.2 0.2 or 1.0 E.U./dL   Nitrite, UA Negative Negative   Leukocytes, UA Negative Negative     Assessment/Plan:  1. Abdominal pain Point tenderness in LLQ  concerning for early partial bowel obstruction given h/o many abdominal surgeries but patient having BMs in form of diarrhea and does not have n/v. Also on DDx is early diverticulitis but she is afebrile and well appearing. Discussed with preceptor who recommended STAT WBC and if elevated send to ED for CT. If WNL then watchful waiting over the weekend will call back on Monday to see if needs CT. Red flags discussed with patient including fever, worsening abdominal pain, nausea, vomiting, blood in stool.  - POCT urinalysis dipstick - CBC with Differential/Platelet Bufford Lope, DO PGY-2, [facility] 11/07/2017 3:30 PM

## 2017-11-08 LAB — CBC WITH DIFFERENTIAL/PLATELET
BASOS ABS: 0 10*3/uL (ref 0.0–0.2)
Basos: 0 %
EOS (ABSOLUTE): 0.3 10*3/uL (ref 0.0–0.4)
EOS: 3 %
HEMATOCRIT: 38.4 % (ref 34.0–46.6)
HEMOGLOBIN: 12.6 g/dL (ref 11.1–15.9)
IMMATURE GRANS (ABS): 0 10*3/uL (ref 0.0–0.1)
Immature Granulocytes: 0 %
LYMPHS: 32 %
Lymphocytes Absolute: 2.5 10*3/uL (ref 0.7–3.1)
MCH: 31.3 pg (ref 26.6–33.0)
MCHC: 32.8 g/dL (ref 31.5–35.7)
MCV: 95 fL (ref 79–97)
MONOCYTES: 11 %
Monocytes Absolute: 0.9 10*3/uL (ref 0.1–0.9)
NEUTROS ABS: 4.2 10*3/uL (ref 1.4–7.0)
Neutrophils: 54 %
Platelets: 226 10*3/uL (ref 150–379)
RBC: 4.03 x10E6/uL (ref 3.77–5.28)
RDW: 13 % (ref 12.3–15.4)
WBC: 7.9 10*3/uL (ref 3.4–10.8)

## 2017-11-10 ENCOUNTER — Other Ambulatory Visit: Payer: Self-pay

## 2017-11-10 ENCOUNTER — Telehealth: Payer: Self-pay | Admitting: Student in an Organized Health Care Education/Training Program

## 2017-11-10 ENCOUNTER — Ambulatory Visit (INDEPENDENT_AMBULATORY_CARE_PROVIDER_SITE_OTHER): Payer: Medicare Other | Admitting: Family Medicine

## 2017-11-10 ENCOUNTER — Encounter: Payer: Self-pay | Admitting: Family Medicine

## 2017-11-10 VITALS — BP 100/62 | HR 94 | Temp 98.1°F | Wt 154.0 lb

## 2017-11-10 DIAGNOSIS — R109 Unspecified abdominal pain: Secondary | ICD-10-CM

## 2017-11-10 NOTE — Telephone Encounter (Signed)
Discussed with Dr. Nori Riis. Since patient pain not improving she needs to be seen today. Discussed with CMA Page who will get patient scheduled.

## 2017-11-10 NOTE — Telephone Encounter (Signed)
Will forward to MD to advise. Jazmin Hartsell,CMA  

## 2017-11-10 NOTE — Telephone Encounter (Signed)
Pt was seen by Dr Shawna Orleans on Friday for abdominal pain. Pt was told by Dr Shawna Orleans to call on Monday if her pain was not any better and she would order a CT Scan for her abdomen. Pt would like this ordered. Please call pt once this has been placed.

## 2017-11-10 NOTE — Telephone Encounter (Signed)
appt made.  Jazmin Hartsell,CMA  

## 2017-11-13 DIAGNOSIS — H25811 Combined forms of age-related cataract, right eye: Secondary | ICD-10-CM | POA: Diagnosis not present

## 2017-11-13 DIAGNOSIS — H02831 Dermatochalasis of right upper eyelid: Secondary | ICD-10-CM | POA: Diagnosis not present

## 2017-11-13 DIAGNOSIS — H40013 Open angle with borderline findings, low risk, bilateral: Secondary | ICD-10-CM | POA: Diagnosis not present

## 2017-11-13 DIAGNOSIS — Z961 Presence of intraocular lens: Secondary | ICD-10-CM | POA: Diagnosis not present

## 2017-11-13 DIAGNOSIS — E119 Type 2 diabetes mellitus without complications: Secondary | ICD-10-CM | POA: Diagnosis not present

## 2017-11-13 DIAGNOSIS — H10413 Chronic giant papillary conjunctivitis, bilateral: Secondary | ICD-10-CM | POA: Diagnosis not present

## 2017-11-13 DIAGNOSIS — H02834 Dermatochalasis of left upper eyelid: Secondary | ICD-10-CM | POA: Diagnosis not present

## 2017-11-13 DIAGNOSIS — H26492 Other secondary cataract, left eye: Secondary | ICD-10-CM | POA: Diagnosis not present

## 2017-11-13 DIAGNOSIS — R109 Unspecified abdominal pain: Secondary | ICD-10-CM | POA: Insufficient documentation

## 2017-11-13 LAB — HM DIABETES EYE EXAM

## 2017-11-13 NOTE — Progress Notes (Signed)
Subjective:    Patient ID: Michelle [name], female    DOB: 10-09-1950, 67 y.o.   MRN: [id number]   CC: Left-sided abdominal pain  HPI: Patient is 67 year old female who presents today complaining of left lower quadrant pain.  Patient reports that she was seen last week on Thursday by PCP for left lower quadrant pain.  Patient reports that she was told to continue monitoring over the weekend for any clinical improvement.  Patient reports that pain is still 7 out of 10.  She denies any fever, chills, nausea, vomiting, diarrhea, dysuria, tarry stool.  Patient mentions has history of IBS with what appears to be intermittent constipation mixed with diarrhea.  Patient is here today because blood work has been unremarkable she continue to have moderate/severe pain.  Patient reports that she was told that we will do imaging if symptoms do not improve over the weekend.  Smoking status reviewed   ROS: all other systems were reviewed and are negative other than in the HPI   Past Medical History:  Diagnosis Date  . Allergy   . Anxiety   . Asthma   . Cataract    beginning on left eye  . Diabetes mellitus   . Diverticulosis   . GERD (gastroesophageal reflux disease)   . HLD (hyperlipidemia)   . Lung nodules   . Osteoporosis   . RA (rheumatoid arthritis) (HCC)    CCP positive followed by [facility]    Past Surgical History:  Procedure Laterality Date  . ABDOMINAL HYSTERECTOMY    . APPENDECTOMY    . CARPAL TUNNEL RELEASE    . CATARACT EXTRACTION Left    with lid lift  . CHOLECYSTECTOMY    . HERNIA REPAIR    . KNEE SURGERY     x2  . LEFT AND RIGHT HEART CATHETERIZATION WITH CORONARY ANGIOGRAM N/A 08/17/2013   Procedure: LEFT AND RIGHT HEART CATHETERIZATION WITH CORONARY ANGIOGRAM;  Surgeon: Laverda Page, MD;  Location: [facility] CATH LAB;  Service: Cardiovascular;  Laterality: N/A;  . right foot bone spurs removed    . SHOULDER SURGERY     x2    Past medical history, surgical, family, and  social history reviewed and updated in the EMR as appropriate.  Objective:  BP 100/62   Pulse 94   Temp 98.1 F (36.7 C) (Oral)   Wt 154 lb (69.9 kg)   SpO2 92%   BMI 27.28 kg/m   Vitals and nursing note reviewed  General: NAD, pleasant, able to participate in exam Cardiac: RRR, normal heart sounds, no murmurs. 2+ radial and PT pulses bilaterally Respiratory: CTAB, normal effort, No wheezes, rales or rhonchi Abdomen: soft, nontender, nondistended, no hepatic or splenomegaly, left lower quadrant pain with palpation +BS left lower quadrant tender to palpation, no masses. Extremities: no edema or cyanosis. WWP. Skin: warm and dry, no rashes noted Neuro: alert and oriented x4, no focal deficits Psych: Normal affect and mood   Assessment & Plan:    Abdominal pain Patient presents with continued left lower quadrant abdominal pain for the past few days.  Previously seen on 5/20 with CBC and BMP results within normal limits.  Patient reports severe diarrhea over the weekend.  She currently denies any fevers, chills, nausea, vomiting.  Left lower quadrant pain is well localized.  Highly suspicious for possible diverticulitis.  However given significant diarrhea over the weekend differential would also include possible gastroenteritis.  Malignancy is always a concern in individual her age with presentation.  Mesenteric ischemia will also be part of her differential although less likely.  Given patient persistent pain, diarrhea with normal CBC and BMP will order an abdominal CT for further visualization of anatomy and possible structural defect.   --Order abdominal CT with contrast, will follow up on results    Marjie Skiff, MD [place] PGY-2

## 2017-11-13 NOTE — Assessment & Plan Note (Addendum)
Patient presents with continued left lower quadrant abdominal pain for the past few days.  Previously seen on 5/20 with CBC and BMP results within normal limits.  Patient reports severe diarrhea over the weekend.  She currently denies any fevers, chills, nausea, vomiting.  Left lower quadrant pain is well localized.  Highly suspicious for possible diverticulitis.  However given significant diarrhea over the weekend differential would also include possible gastroenteritis.  Malignancy is always a concern in individual her age with presentation.  Mesenteric ischemia will also be part of her differential although less likely.  Given patient persistent pain, diarrhea with normal CBC and BMP will order an abdominal CT for further visualization of anatomy and possible structural defect.   --Order abdominal CT with contrast, will follow up on results

## 2017-11-17 ENCOUNTER — Other Ambulatory Visit: Payer: Self-pay | Admitting: Family Medicine

## 2017-11-17 ENCOUNTER — Other Ambulatory Visit: Payer: Self-pay

## 2017-11-17 ENCOUNTER — Ambulatory Visit (HOSPITAL_COMMUNITY)
Admission: RE | Admit: 2017-11-17 | Discharge: 2017-11-17 | Disposition: A | Discharge status: Home / Self Care | Payer: Medicare Other | Source: Ambulatory Visit | Attending: Family Medicine | Admitting: Family Medicine

## 2017-11-17 DIAGNOSIS — I700 Atherosclerosis of aorta: Secondary | ICD-10-CM | POA: Insufficient documentation

## 2017-11-17 DIAGNOSIS — R160 Hepatomegaly, not elsewhere classified: Secondary | ICD-10-CM | POA: Diagnosis not present

## 2017-11-17 DIAGNOSIS — R109 Unspecified abdominal pain: Secondary | ICD-10-CM

## 2017-11-17 DIAGNOSIS — K760 Fatty (change of) liver, not elsewhere classified: Secondary | ICD-10-CM | POA: Diagnosis not present

## 2017-11-17 LAB — POCT I-STAT CREATININE: Creatinine, Ser: 0.6 mg/dL (ref 0.44–1.00)

## 2017-11-17 MED ORDER — IOHEXOL 300 MG/ML  SOLN
100.0000 mL | Freq: Once | INTRAMUSCULAR | Status: AC | PRN
  Administered 2017-11-17: 100 mL via INTRAVENOUS

## 2017-11-17 NOTE — Telephone Encounter (Signed)
FYI: Patient is having a CT abdomen/pelvis today at [facility]. Next appointment with Dr. Burr Medico, 11/28/2017 to go over med refills and A1c.

## 2017-11-17 NOTE — Telephone Encounter (Signed)
LVMOM for pt to either give [place] the PCP name or she can call PCP directly for any refills

## 2017-11-17 NOTE — Telephone Encounter (Signed)
Needs to go to pt's current PCP. Please check this before routing.  Michelle [name]. Jerline Pain, MD 11/17/2017 1:42 PM

## 2017-11-18 MED ORDER — FLUTICASONE PROPIONATE 50 MCG/ACT NA SUSP: 2.0000 | Freq: Every day | NASAL | 3 refills | Status: DC

## 2017-11-19 ENCOUNTER — Telehealth: Payer: Self-pay | Admitting: Student in an Organized Health Care Education/Training Program

## 2017-11-19 NOTE — Telephone Encounter (Signed)
Would like results from CT scan

## 2017-11-20 NOTE — Telephone Encounter (Signed)
Pt informed of below, she asked about the upper scan and I told her that it looked like there was just one scan ordered and it must have covered the areas that they were wanting to check. Katharina Caper, Aaliyah Gavel D, [place]

## 2017-11-20 NOTE — Telephone Encounter (Signed)
White team, please call and let patient know that her CT abdomen was negative for diverticulitis and did not show any other cause for abdominal pain. Forwarding to ordering MD as an FYI.

## 2017-11-27 DIAGNOSIS — M059 Rheumatoid arthritis with rheumatoid factor, unspecified: Secondary | ICD-10-CM | POA: Diagnosis not present

## 2017-11-28 ENCOUNTER — Ambulatory Visit (INDEPENDENT_AMBULATORY_CARE_PROVIDER_SITE_OTHER): Payer: Medicare Other | Admitting: Internal Medicine

## 2017-11-28 ENCOUNTER — Encounter: Payer: Self-pay | Admitting: Internal Medicine

## 2017-11-28 ENCOUNTER — Ambulatory Visit: Payer: Medicare Other | Admitting: Student in an Organized Health Care Education/Training Program

## 2017-11-28 ENCOUNTER — Other Ambulatory Visit: Payer: Self-pay

## 2017-11-28 VITALS — BP 130/74 | HR 84 | Temp 97.7°F | Ht 64.0 in | Wt 154.4 lb

## 2017-11-28 DIAGNOSIS — G894 Chronic pain syndrome: Secondary | ICD-10-CM

## 2017-11-28 DIAGNOSIS — J45909 Unspecified asthma, uncomplicated: Secondary | ICD-10-CM | POA: Diagnosis not present

## 2017-11-28 DIAGNOSIS — I10 Essential (primary) hypertension: Secondary | ICD-10-CM

## 2017-11-28 DIAGNOSIS — D649 Anemia, unspecified: Secondary | ICD-10-CM | POA: Diagnosis not present

## 2017-11-28 DIAGNOSIS — E119 Type 2 diabetes mellitus without complications: Secondary | ICD-10-CM

## 2017-11-28 LAB — POCT GLYCOSYLATED HEMOGLOBIN (HGB A1C): HbA1c, POC (controlled diabetic range): 7.9 % — AB (ref 0.0–7.0)

## 2017-11-28 MED ORDER — ONGLYZA 5 MG PO TABS: 5.0000 mg | ORAL_TABLET | Freq: Every day | ORAL | 2 refills | Status: DC

## 2017-11-28 MED ORDER — ALBUTEROL SULFATE HFA 108 (90 BASE) MCG/ACT IN AERS: INHALATION_SPRAY | RESPIRATORY_TRACT | 1 refills | Status: DC

## 2017-11-28 MED ORDER — OXYCODONE-ACETAMINOPHEN 10-325 MG PO TABS: 1.0000 | ORAL_TABLET | Freq: Four times a day (QID) | ORAL | 0 refills | Status: DC | PRN

## 2017-11-28 MED ORDER — GLUCOSE BLOOD VI STRP: ORAL_STRIP | 12 refills | Status: DC

## 2017-11-28 MED ORDER — SERTRALINE HCL 50 MG PO TABS: 50.0000 mg | ORAL_TABLET | Freq: Every day | ORAL | 3 refills | Status: DC

## 2017-11-28 NOTE — Assessment & Plan Note (Signed)
Concern for Anemia  Hgb wnl  No further work up for now

## 2017-11-28 NOTE — Patient Instructions (Addendum)
I am going to start you on Trulicity at  9.15 mg - please use once weekly. Blood pressure looks good. Your last blood count showed no signs of anemia. Please follow up in 3 months.

## 2017-11-28 NOTE — Assessment & Plan Note (Signed)
Blood pressure well controlled  Continue losartan  Follow up in 3 months

## 2017-11-28 NOTE — Progress Notes (Signed)
   [facility] Kerrin Mo, MD Phone: [phone]  Reason For Visit: Follow up Diabetes   # CHRONIC DM, Type 2: CBGs have been running 130  Meds: ONGLYZA and Metformin  Reports  good compliance. Tolerating well w/o side-effects Currently on ACEi / ARB  Lifestyle: Does not exercise much, Not eating as much, limiting sugar intake - 1 diet drink a day, and sugar tea daily  Any hypoglycemia episodes: Denies palpations, diaphoresis, fatigue, weakness, jittery Denies polyuria, visual changes, numbness or tingling  # Anemia Patient state that she is worried about her iron. She chewies on a lot of ice. Last hemglobin 12.6 on May 10th. She denies any blood in her stools.    CHRONIC HTN: Reports no concerns Current Meds - Losartan  Reports good compliance, took meds today. Tolerating well, w/o complaints. Lifestyle - as above  Denies CP, dyspnea, HA, edema, dizziness / lightheadedness  Past Medical History Reviewed problem list.  Medications- reviewed and updated No additions to family history Social history- patient is a non smoker  Objective: BP 130/74   Pulse 84   Temp 97.7 F (36.5 C) (Oral)   Ht 5\' 4"  (1.626 m)   Wt 154 lb 6.4 oz (70 kg)   SpO2 96%   BMI 26.50 kg/m  Gen: NAD, alert, cooperative with exam Cardio: regular rate and rhythm, S1S2 heard, no murmurs appreciated Pulm: clear to auscultation bilaterally, no wheezes, rhonchi or rales Skin: dry, intact, no rashes or lesions   Assessment/Plan: See problem based a/p  Diabetes mellitus without complication (HCC) B2W 7.9 - increasing from previous  - Discussed diet changes with patient - including stopping the sweet tea  - Continue Onglyza and Metformin  - Will start trucility  At 0.75 mg once weekly  - Follow up in 3 months   Essential hypertension Blood pressure well controlled  Continue losartan  Follow up in 3 months   Anemia Concern for Anemia  Hgb wnl  No further work up for  now

## 2017-11-28 NOTE — Assessment & Plan Note (Signed)
A1C 7.9 - increasing from previous  - Discussed diet changes with patient - including stopping the sweet tea  - Continue Onglyza and Metformin  - Will start trucility  At 0.75 mg once weekly  - Follow up in 3 months

## 2017-12-01 ENCOUNTER — Telehealth: Payer: Self-pay

## 2017-12-01 DIAGNOSIS — E119 Type 2 diabetes mellitus without complications: Secondary | ICD-10-CM

## 2017-12-01 MED ORDER — GLUCOSE BLOOD VI STRP: ORAL_STRIP | 12 refills | Status: DC

## 2017-12-01 MED ORDER — DULAGLUTIDE 0.75 MG/0.5ML ~~LOC~~ SOAJ: 0.7500 mg | SUBCUTANEOUS | 1 refills | Status: DC

## 2017-12-01 NOTE — Telephone Encounter (Signed)
Sent in another prescription for the [employer] Blood Sugar Strips

## 2017-12-01 NOTE — Telephone Encounter (Signed)
Pharmacy Note:  "Please resend new Rx with specific direction, diagnosis code and sign for billing."  Ottis Stain, CMA

## 2017-12-01 NOTE — Telephone Encounter (Signed)
I have placed order for trucility, discuss the amount of test strips with patient already

## 2017-12-01 NOTE — Telephone Encounter (Signed)
Patient left message on nurse line that Trulicity was not sent to pharmacy after last OV on 11/28/17. Also stated pharmacist said test strips need to be re-sent with testing frequency on the prescription.  Call back is [phone].  Danley Danker, RN [facility] RN)

## 2017-12-02 DIAGNOSIS — N3941 Urge incontinence: Secondary | ICD-10-CM | POA: Diagnosis not present

## 2017-12-02 DIAGNOSIS — R3915 Urgency of urination: Secondary | ICD-10-CM | POA: Diagnosis not present

## 2017-12-03 NOTE — Telephone Encounter (Signed)
Rx needs to be specific. Can not say "as needed for low blood sugars." How many times a day should pt check sugar. Ottis Stain, CMA

## 2017-12-03 NOTE — Telephone Encounter (Signed)
Fax from pharmacy that test strips Rx still needs clarification. Please call to find out exactly how needs to be written for insurance purposes.  Danley Danker, RN [facility] RN)

## 2017-12-03 NOTE — Telephone Encounter (Signed)
Covering PCP inbox. Rx clearly states to check once every morning and that patient should also check during symptomatic lows. Does the as needed part need to be deleted from the Rx?

## 2017-12-04 ENCOUNTER — Encounter: Payer: Self-pay | Admitting: Student in an Organized Health Care Education/Training Program

## 2017-12-04 MED ORDER — GLUCOSE BLOOD VI STRP: ORAL_STRIP | 12 refills | Status: DC

## 2017-12-04 NOTE — Telephone Encounter (Addendum)
Changed CBG order prescription.

## 2017-12-04 NOTE — Addendum Note (Signed)
Addended by: Kerrin Mo Z on: 12/04/2017 11:15 AM   Modules accepted: Orders

## 2017-12-08 NOTE — Telephone Encounter (Signed)
Pt has been told by pharmacist the Rx must pt is to test her blood sugar 3 to 4 times per day.  Please revise RX and send to [employer].

## 2017-12-10 MED ORDER — GLUCOSE BLOOD VI STRP: 1.0000 | ORAL_STRIP | Freq: Three times a day (TID) | 12 refills | Status: DC

## 2017-12-10 NOTE — Telephone Encounter (Signed)
Changed rx to TID CBGs.

## 2017-12-10 NOTE — Addendum Note (Signed)
Addended by: Glenis Smoker on: 12/10/2017 12:14 PM   Modules accepted: Orders

## 2017-12-11 ENCOUNTER — Ambulatory Visit (INDEPENDENT_AMBULATORY_CARE_PROVIDER_SITE_OTHER): Payer: Medicare Other | Admitting: Sports Medicine

## 2017-12-11 ENCOUNTER — Encounter: Payer: Self-pay | Admitting: Sports Medicine

## 2017-12-11 ENCOUNTER — Ambulatory Visit
Admission: RE | Admit: 2017-12-11 | Discharge: 2017-12-11 | Disposition: A | Discharge status: Home / Self Care | Payer: Medicare Other | Source: Ambulatory Visit | Attending: Sports Medicine | Admitting: Sports Medicine

## 2017-12-11 VITALS — BP 140/70 | Ht 63.0 in | Wt 153.0 lb

## 2017-12-11 DIAGNOSIS — M25552 Pain in left hip: Secondary | ICD-10-CM | POA: Diagnosis not present

## 2017-12-11 DIAGNOSIS — G8929 Other chronic pain: Secondary | ICD-10-CM

## 2017-12-11 DIAGNOSIS — M47816 Spondylosis without myelopathy or radiculopathy, lumbar region: Secondary | ICD-10-CM | POA: Diagnosis not present

## 2017-12-11 DIAGNOSIS — M5442 Lumbago with sciatica, left side: Secondary | ICD-10-CM

## 2017-12-11 NOTE — Progress Notes (Signed)
Subjective:    Michelle [name] is a 67 y.o. old female here for left hip pain.  HPI Left hip pain: failed while loading a trailer about 6 months ago.  She fell on her left buttock.  She did not seek medical care at that time.  However, pain persisted and she went to PCP office about 3 months later.  She had left hip x-ray which was basically normal.  None she went to [facility] and had a steroid injection to her left hip in April which did not help with her pain.  She says she had an MRI done which showed torn left hamstring and tendinitis in the right hamstring.  She says she was started on diclofenac 50 mg twice daily which was later increased to 75 mg twice daily but without improvement in her pain.  She is also on Percocet.  She went to her granddaughter's graduation about a week ago.  She says she had an excruciating pain in her left hip after standing for few minutes and she had to sit down quickly.  Pain usually sore and achy but Burns at times.  Pain is mainly over the lateral aspect of her hip.  Sometimes radiating down to her knee laterally.  She denies numbness or tingling her legs but felt some crawling sensation over the lateral aspect of the left leg.  She also reports new left-sided back pain.  She denies fever, chills or unintentional weight loss.  She reports intermittent bowel and bladder accidents for the last 3 to 4 months.  She was seen by urology for this. She has history of rheumatoid arthritis.  She is on DMARDs.  PMH/Problem List: has Diabetes mellitus without complication ([place]); Hyperlipidemia; OBESITY, NOS; Anxiety and depression; Asthma, chronic; GERD; Rheumatoid arthritis ([place]); Restrictive lung disease; Osteoporosis, unspecified; Gastroparesis; Restless legs; Irritable bowel; Essential hypertension; Pulmonary nodules; Prolonged Q-T interval on ECG; Encounter for chronic pain management; Squamous cell carcinoma in situ of skin; UTI symptoms; Hearing loss; Rash and nonspecific skin  eruption; Urge incontinence of urine; Dizziness; Left hip pain; Abdominal pain; and Anemia on their problem list.   has a past medical history of Allergy, Anxiety, Asthma, Cataract, Diabetes mellitus, Diverticulosis, GERD (gastroesophageal reflux disease), HLD (hyperlipidemia), Lung nodules, Osteoporosis, and RA (rheumatoid arthritis) ([place]).  FH:  Family History  Problem Relation Age of Onset  . Stroke Father 46       died  . Colon cancer Maternal Aunt   . Lung cancer Brother 71       died  . Rheum arthritis Sister 60  . Colon cancer Daughter   . COPD Sister   . Cancer Sister        in nose  . Esophageal cancer Neg Hx   . Rectal cancer Neg Hx   . Stomach cancer Neg Hx     SH Social History   Tobacco Use  . Smoking status: Former Smoker    Packs/day: 1.50    Years: 5.00    Pack years: 7.50    Types: Cigarettes    Last attempt to quit: 05/31/1994    Years since quitting: 23.5  . Smokeless tobacco: Never Used  Substance Use Topics  . Alcohol use: No    Alcohol/week: 0.0 oz  . Drug use: No    Review of Systems Review of systems negative except for pertinent positives and negatives in history of present illness above.     Objective:     Vitals:   12/11/17 1027  BP: 140/70  Weight: 153 lb (69.4 kg)  Height: 5\' 3"  (1.6 m)   Body mass index is 27.1 kg/m.  Physical Exam  GEN: appears well. No apparent distress. Head: normocephalic and atraumatic  RESP: no IWOB MSK:  Back & LE exam  Normal skin, spine with normal alignment and no deformity.  Lower extremities appear symmetric  No step offs.  Tenderness to palpation over left greater trochanter and left SI joint.  No tenderness over ischial tuberosities or origin of hamstring muscles.  Lying and seated SLR negative  Motor 4-/5 with left knee extension and left hip abduction,  4/5 with right hip abduction, 4+/5 in all other muscle groups.  Light sensation intact in all dermatomes of lower extremity.  Patellar  reflex 2+ bilaterally.  Achille reflex 1+ bilaterally.  Antalgic gait (trying to offload weight from LLE).  Was not able to perform Trendelenburg due to balance issue.  NEURO: As above PSYCH: euthymic mood with congruent affect    Assessment and Plan:  1. Left hip pain: leading differential diagnosis include left gluteus medius tendinitis and left greater trochanteric bursitis.  She has significant left hip abduction weakness favoring left gluteus medius tendinitis.  Patient did not have significant improvement with steroid injection to left greater trochanter about 3 months ago which argues against trochanteric bursitis.  Left hip x-ray did not show fracture, dislocation or significant arthritis about 2 weeks ago.  I doubt her pain is coming from hamstring injury or tendinitis.  We will obtain MRI results from [facility] and go from there.  Meanwhile, we gave her a prescription for walker with a seat.  2. Chronic left-sided low back pain with left-sided sciatica: She has intermittent burning pain from her hip to her knee.  Exam with tenderness to palpation over her left SI joint.  No significant tenderness over lumbar spines.  Neuro exam more or less reassuring except for some weakness is left knee extension and left hip abduction.  She has symmetric patellar and Achilles reflexes bilaterally.  However, she reports 3 to 4 months of intermittent bowel and bladder accidents. She is also on DMARD's for RA.  So it is reasonable to rule out serious conditions such as cauda equina with an MRI of lumbar spine.  - DG Lumbar Spine 2-3 Views; Future - MR Lumbar Spine Wo Contrast; Future  Mercy Riding, MD 12/11/17 Pager: [phone]  Patient seen and evaluated with the resident. I agree with the above plan of care. I will obtain the records from [facility], including the MRI report of the left hip. I question whether or not she has an injury to the gluteus medius tendon. Physical exam does not suggest  hamstring injury. Of note, she was also complaining of some bowel and bladder incontinence which has been worsening over the past 3 months. An x-ray of her lumbar spine shows degenerative changes but nothing acute. We will get an MRI specifically to rule out spinal stenosis which may be causing early cauda equina symptoms. Phone follow-up with the MRI results when available and after I reviewed the records from [facility].

## 2017-12-12 ENCOUNTER — Encounter: Payer: Self-pay | Admitting: Sports Medicine

## 2017-12-15 ENCOUNTER — Ambulatory Visit
Admission: RE | Admit: 2017-12-15 | Discharge: 2017-12-15 | Disposition: A | Discharge status: Home / Self Care | Payer: Medicare Other | Source: Ambulatory Visit | Attending: Sports Medicine | Admitting: Sports Medicine

## 2017-12-15 DIAGNOSIS — M5442 Lumbago with sciatica, left side: Principal | ICD-10-CM

## 2017-12-15 DIAGNOSIS — G8929 Other chronic pain: Secondary | ICD-10-CM

## 2017-12-15 DIAGNOSIS — M48061 Spinal stenosis, lumbar region without neurogenic claudication: Secondary | ICD-10-CM | POA: Diagnosis not present

## 2017-12-18 ENCOUNTER — Telehealth: Payer: Self-pay | Admitting: Sports Medicine

## 2017-12-18 ENCOUNTER — Other Ambulatory Visit: Payer: Self-pay

## 2017-12-18 DIAGNOSIS — G8929 Other chronic pain: Secondary | ICD-10-CM

## 2017-12-18 DIAGNOSIS — M5442 Lumbago with sciatica, left side: Principal | ICD-10-CM

## 2017-12-18 NOTE — Telephone Encounter (Signed)
I spoke with the patient on the phone today after reviewing the records from [facility] including a recent MRI of her left hip. Left hip MRI is fairly unimpressive. She has some tendinopathy of the left hamstring origin but otherwise her hip appears very healthy. I also reviewed an MRI of her lumbar spine which we recently got. No evidence of cauda equina but she does have significant spinal stenosis. This may be the source of her posterior left hip pain. She is also now complaining of burning pain down the left leg. I recommended consultation with [facility] for their input. Patient is in agreement with that plan. Follow-up with me as needed.

## 2017-12-19 ENCOUNTER — Encounter: Payer: Self-pay | Admitting: Sports Medicine

## 2017-12-25 NOTE — Telephone Encounter (Signed)
Pt was told by pharmacist a new Rx for her test strips needs to be sent. This prescription needs to be signed by [facility] enrolled dr.  I did verfy this with pharmacist.

## 2017-12-26 ENCOUNTER — Encounter: Payer: Self-pay | Admitting: Student in an Organized Health Care Education/Training Program

## 2017-12-26 ENCOUNTER — Other Ambulatory Visit: Payer: Self-pay

## 2017-12-26 DIAGNOSIS — E119 Type 2 diabetes mellitus without complications: Secondary | ICD-10-CM

## 2017-12-26 MED ORDER — GLUCOSE BLOOD VI STRP: ORAL_STRIP | 12 refills | Status: DC

## 2017-12-31 ENCOUNTER — Other Ambulatory Visit: Payer: Self-pay | Admitting: Family Medicine

## 2017-12-31 DIAGNOSIS — E119 Type 2 diabetes mellitus without complications: Secondary | ICD-10-CM

## 2017-12-31 MED ORDER — GLUCOSE BLOOD VI STRP: ORAL_STRIP | 12 refills | Status: DC

## 2018-01-12 DIAGNOSIS — Z6826 Body mass index (BMI) 26.0-26.9, adult: Secondary | ICD-10-CM | POA: Diagnosis not present

## 2018-01-12 DIAGNOSIS — M4316 Spondylolisthesis, lumbar region: Secondary | ICD-10-CM | POA: Diagnosis not present

## 2018-01-15 DIAGNOSIS — M25552 Pain in left hip: Secondary | ICD-10-CM | POA: Diagnosis not present

## 2018-01-15 DIAGNOSIS — M545 Low back pain: Secondary | ICD-10-CM | POA: Diagnosis not present

## 2018-01-15 DIAGNOSIS — M25652 Stiffness of left hip, not elsewhere classified: Secondary | ICD-10-CM | POA: Diagnosis not present

## 2018-01-15 DIAGNOSIS — R262 Difficulty in walking, not elsewhere classified: Secondary | ICD-10-CM | POA: Diagnosis not present

## 2018-01-16 DIAGNOSIS — M25552 Pain in left hip: Secondary | ICD-10-CM | POA: Diagnosis not present

## 2018-01-16 DIAGNOSIS — R262 Difficulty in walking, not elsewhere classified: Secondary | ICD-10-CM | POA: Diagnosis not present

## 2018-01-16 DIAGNOSIS — M25652 Stiffness of left hip, not elsewhere classified: Secondary | ICD-10-CM | POA: Diagnosis not present

## 2018-01-16 DIAGNOSIS — M545 Low back pain: Secondary | ICD-10-CM | POA: Diagnosis not present

## 2018-01-20 DIAGNOSIS — R262 Difficulty in walking, not elsewhere classified: Secondary | ICD-10-CM | POA: Diagnosis not present

## 2018-01-20 DIAGNOSIS — M25552 Pain in left hip: Secondary | ICD-10-CM | POA: Diagnosis not present

## 2018-01-20 DIAGNOSIS — M545 Low back pain: Secondary | ICD-10-CM | POA: Diagnosis not present

## 2018-01-20 DIAGNOSIS — M25652 Stiffness of left hip, not elsewhere classified: Secondary | ICD-10-CM | POA: Diagnosis not present

## 2018-01-21 DIAGNOSIS — R262 Difficulty in walking, not elsewhere classified: Secondary | ICD-10-CM | POA: Diagnosis not present

## 2018-01-21 DIAGNOSIS — M545 Low back pain: Secondary | ICD-10-CM | POA: Diagnosis not present

## 2018-01-21 DIAGNOSIS — M25652 Stiffness of left hip, not elsewhere classified: Secondary | ICD-10-CM | POA: Diagnosis not present

## 2018-01-21 DIAGNOSIS — M25552 Pain in left hip: Secondary | ICD-10-CM | POA: Diagnosis not present

## 2018-01-22 DIAGNOSIS — Z79899 Other long term (current) drug therapy: Secondary | ICD-10-CM | POA: Diagnosis not present

## 2018-01-22 DIAGNOSIS — M059 Rheumatoid arthritis with rheumatoid factor, unspecified: Secondary | ICD-10-CM | POA: Diagnosis not present

## 2018-01-23 ENCOUNTER — Ambulatory Visit (INDEPENDENT_AMBULATORY_CARE_PROVIDER_SITE_OTHER): Payer: Medicare Other | Admitting: Student in an Organized Health Care Education/Training Program

## 2018-01-23 ENCOUNTER — Encounter: Payer: Self-pay | Admitting: Student in an Organized Health Care Education/Training Program

## 2018-01-23 ENCOUNTER — Other Ambulatory Visit: Payer: Self-pay

## 2018-01-23 VITALS — BP 116/66 | HR 93 | Temp 98.6°F | Ht 63.0 in | Wt 153.0 lb

## 2018-01-23 DIAGNOSIS — G894 Chronic pain syndrome: Secondary | ICD-10-CM | POA: Diagnosis not present

## 2018-01-23 DIAGNOSIS — G8929 Other chronic pain: Secondary | ICD-10-CM | POA: Diagnosis not present

## 2018-01-23 DIAGNOSIS — I10 Essential (primary) hypertension: Secondary | ICD-10-CM | POA: Diagnosis not present

## 2018-01-23 DIAGNOSIS — E119 Type 2 diabetes mellitus without complications: Secondary | ICD-10-CM | POA: Diagnosis not present

## 2018-01-23 MED ORDER — CETIRIZINE HCL 10 MG PO TABS: 10.0000 mg | ORAL_TABLET | Freq: Every day | ORAL | 3 refills | Status: DC

## 2018-01-23 MED ORDER — LOSARTAN POTASSIUM 25 MG PO TABS: 25.0000 mg | ORAL_TABLET | Freq: Every day | ORAL | 0 refills | Status: DC

## 2018-01-23 MED ORDER — OXYCODONE-ACETAMINOPHEN 10-325 MG PO TABS: 1.0000 | ORAL_TABLET | Freq: Four times a day (QID) | ORAL | 0 refills | Status: DC | PRN

## 2018-01-23 MED ORDER — METFORMIN HCL 1000 MG PO TABS: 1000.0000 mg | ORAL_TABLET | Freq: Two times a day (BID) | ORAL | 3 refills | Status: DC

## 2018-01-23 NOTE — Assessment & Plan Note (Signed)
1. Chronic pain syndrome - since heat and rolling out her hip are helpful at PT, suggested trying these measures at home as well - patient to follow up with neurosurg in mid- august for potential injection - given refills x3 months, paper scripts that were postdated: oxyCODONE-acetaminophen (PERCOCET) 10-325 MG tablet; Take 1 tablet by mouth every 6 (six) hours as needed for pain.  Dispense: 120 tablet; Refill: 0

## 2018-01-23 NOTE — Patient Instructions (Signed)
It was a pleasure seeing you today in our clinic.   Sign up for My Chart to have easy access to your labs results, and communication with your primary care physician.  Our clinic's number is [phone]. Please call with questions or concerns about what we discussed today.  Be well, Dr. Burr Medico

## 2018-01-23 NOTE — Progress Notes (Signed)
Subjective:    Michelle [name] - 67 y.o. female MRN [id number]  Date of birth: 1950-12-16  HPI  Michelle [name] is here for follow-up chronic pain management.  Encounter for Chronic Pain - lumbar back pain and arthritic pain She reports that since her last visit with me she was seen by sports medicine and sent for an MRI.  She was found to have radiculopathy and was sent to neurosurgery, where she will return in mid August for an injection.  She has been following with physical therapy and has found that measures in PT are effective but she feels her pain returns immediately after each physical therapy session.  Patient does endorse notable decrease in function due to hip and back pain over the last 6 months.  She is required to lean forward when she is at the grocery store in order to keep her pain in check.  She has noticed that she can walk less distance.,  And needs to sit down more frequently.  She now needs to sit on a stool in order to make dinner.   -  reports that she quit smoking about 23 years ago. Her smoking use included cigarettes. She has a 7.50 pack-year smoking history. She has never used smokeless tobacco. - Review of Systems: Per HPI. - Past Medical History: Patient Active Problem List   Diagnosis Date Noted  . Left hip pain 09/11/2017  . Urge incontinence of urine 04/18/2017  . Rash and nonspecific skin eruption 11/14/2016  . Hearing loss 07/19/2015  . Squamous cell carcinoma in situ of skin 11/25/2014  . Encounter for chronic pain management 08/05/2014  . Prolonged Q-T interval on ECG 07/05/2014  . Pulmonary nodules 03/02/2014  . Essential hypertension 10/30/2012  . Irritable bowel 07/13/2012  . Restless legs 05/14/2012  . Gastroparesis 11/19/2011  . Osteoporosis, unspecified 08/08/2011  . Restrictive lung disease 08/09/2010  . Asthma, chronic 03/14/2010  . Rheumatoid arthritis ([place]) 01/08/2010  . GERD 08/22/2008  . Diabetes mellitus without complication  ([place]) 24/23/5361  . Hyperlipidemia 08/28/2006  . OBESITY, NOS 08/28/2006  . Anxiety and depression 08/28/2006   - Medications: reviewed and updated Current Outpatient Medications  Medication Sig Dispense Refill  . albuterol (VENTOLIN HFA) 108 (90 Base) MCG/ACT inhaler INHALE TWO PUFFS BY MOUTH EVERY 4 HOURS AS NEEDED FOR WHEEZING FOR SHORTNESS OF BREATH 6 Inhaler 1  . aspirin EC 81 MG tablet Take 81 mg by mouth daily.    . cetirizine (ZYRTEC) 10 MG tablet Take 1 tablet (10 mg total) by mouth daily. 90 tablet 3  . diclofenac sodium (VOLTAREN) 1 % GEL Apply 2 g topically 4 (four) times daily as needed (for pain).    . Dulaglutide (TRULICITY) 4.43 XV/4.0GQ SOPN Inject 0.75 mg into the skin once a week. 12 pen 1  . fluticasone (FLONASE) 50 MCG/ACT nasal spray Place 2 sprays into both nostrils daily. 48 g 3  . gabapentin (NEURONTIN) 300 MG capsule Take 1 capsule (300 mg total) by mouth at bedtime. 90 capsule 3  . glucose blood (ACCU-CHEK AVIVA) test strip Use to check blood sugar three times daily or as directed. 100 each 12  . hydrocortisone 1 % ointment Apply 1 application topically 2 (two) times daily. 30 g 0  . hydrOXYzine (ATARAX/VISTARIL) 25 MG tablet Take 25 mg by mouth as needed.    . inFLIXimab (REMICADE) 100 MG injection Inject into the vein.    Marland Kitchen leflunomide (ARAVA) 10 MG tablet Take 10 mg by  mouth daily.    Marland Kitchen losartan (COZAAR) 25 MG tablet Take 1 tablet (25 mg total) by mouth daily. 90 tablet 0  . metFORMIN (GLUCOPHAGE) 1000 MG tablet Take 1 tablet (1,000 mg total) by mouth 2 (two) times daily with a meal. 180 tablet 3  . nitroGLYCERIN (NITROSTAT) 0.4 MG SL tablet Place 0.4 mg under the tongue every 5 (five) minutes as needed.      Marland Kitchen omeprazole (PRILOSEC) 40 MG capsule TAKE 1 CAPSULE BY MOUTH TWICE DAILY 180 capsule 3  . ONGLYZA 5 MG TABS tablet Take 1 tablet (5 mg total) by mouth daily. 90 tablet 2  . oxyCODONE-acetaminophen (PERCOCET) 10-325 MG tablet Take 1 tablet by mouth every 6  (six) hours as needed for pain. 120 tablet 0  . primidone (MYSOLINE) 50 MG tablet Take 1 tablet (50 mg total) by mouth 2 (two) times daily. 180 tablet 1  . rosuvastatin (CRESTOR) 10 MG tablet TAKE ONE TABLET BY MOUTH ONCE DAILY 90 tablet 3  . sertraline (ZOLOFT) 50 MG tablet Take 1 tablet (50 mg total) by mouth daily. 90 tablet 3  . triamcinolone (KENALOG) 0.1 % paste USE AS DIRECTED IN  MOUTH/THROAT  TO  THE  LESION  TWICE  DAILY 5 g 1   No current facility-administered medications for this visit.     Review of Systems See HPI     Objective:   Physical Exam BP 116/66   Pulse 93   Temp 98.6 F (37 C) (Oral)   Ht 5\' 3"  (1.6 m)   Wt 153 lb (69.4 kg)   SpO2 96%   BMI 27.10 kg/m  Gen: NAD, alert, cooperative with exam, well-appearing  HEENT: NCAT CV: RRR Resp: non-labored/comfortable work of breathing Abd: SNTND Skin: no rashes Neuro: no gross deficits.  Psych: good insight, alert and oriented  12/15/2017 MR Lumbar Spine IMPRESSION: 1. Lower lumbar disc and facet degeneration with grade 1 anterolisthesis at L4-5. 2. L4-5 moderate to advanced spinal stenosis from herniation and facet hypertrophy. Moderate right foraminal narrowing. 3. L5-S1 moderate left foraminal narrowing. A left paracentral protrusion posteriorly displaces the left S1 nerve root.  Assessment & Plan:   Encounter for chronic pain management 1. Chronic pain syndrome - since heat and rolling out her hip are helpful at PT, suggested trying these measures at home as well - patient to follow up with neurosurg in mid- august for potential injection - given refills x3 months, paper scripts that were postdated: oxyCODONE-acetaminophen (PERCOCET) 10-325 MG tablet; Take 1 tablet by mouth every 6 (six) hours as needed for pain.  Dispense: 120 tablet; Refill: 0    No orders of the defined types were placed in this encounter.   Meds ordered this encounter  Medications  . DISCONTD: oxyCODONE-acetaminophen  (PERCOCET) 10-325 MG tablet    Sig: Take 1 tablet by mouth every 6 (six) hours as needed for pain.    Dispense:  120 tablet    Refill:  0  . DISCONTD: oxyCODONE-acetaminophen (PERCOCET) 10-325 MG tablet    Sig: Take 1 tablet by mouth every 6 (six) hours as needed for pain.    Dispense:  120 tablet    Refill:  0    Fill on or after 8/26  . oxyCODONE-acetaminophen (PERCOCET) 10-325 MG tablet    Sig: Take 1 tablet by mouth every 6 (six) hours as needed for pain.    Dispense:  120 tablet    Refill:  0    Fill on or after 9/26  .  metFORMIN (GLUCOPHAGE) 1000 MG tablet    Sig: Take 1 tablet (1,000 mg total) by mouth 2 (two) times daily with a meal.    Dispense:  180 tablet    Refill:  3  . cetirizine (ZYRTEC) 10 MG tablet    Sig: Take 1 tablet (10 mg total) by mouth daily.    Dispense:  90 tablet    Refill:  3  . losartan (COZAAR) 25 MG tablet    Sig: Take 1 tablet (25 mg total) by mouth daily.    Dispense:  90 tablet    Refill:  0    Everrett Coombe, MD,MS,  PGY3 01/23/2018 11:56 AM

## 2018-01-26 DIAGNOSIS — R262 Difficulty in walking, not elsewhere classified: Secondary | ICD-10-CM | POA: Diagnosis not present

## 2018-01-26 DIAGNOSIS — M545 Low back pain: Secondary | ICD-10-CM | POA: Diagnosis not present

## 2018-01-26 DIAGNOSIS — M25652 Stiffness of left hip, not elsewhere classified: Secondary | ICD-10-CM | POA: Diagnosis not present

## 2018-01-26 DIAGNOSIS — M25552 Pain in left hip: Secondary | ICD-10-CM | POA: Diagnosis not present

## 2018-01-28 DIAGNOSIS — R262 Difficulty in walking, not elsewhere classified: Secondary | ICD-10-CM | POA: Diagnosis not present

## 2018-01-28 DIAGNOSIS — M25552 Pain in left hip: Secondary | ICD-10-CM | POA: Diagnosis not present

## 2018-01-28 DIAGNOSIS — M25652 Stiffness of left hip, not elsewhere classified: Secondary | ICD-10-CM | POA: Diagnosis not present

## 2018-01-28 DIAGNOSIS — M545 Low back pain: Secondary | ICD-10-CM | POA: Diagnosis not present

## 2018-01-29 DIAGNOSIS — M48062 Spinal stenosis, lumbar region with neurogenic claudication: Secondary | ICD-10-CM | POA: Diagnosis not present

## 2018-01-29 DIAGNOSIS — M059 Rheumatoid arthritis with rheumatoid factor, unspecified: Secondary | ICD-10-CM | POA: Diagnosis not present

## 2018-01-30 DIAGNOSIS — R262 Difficulty in walking, not elsewhere classified: Secondary | ICD-10-CM | POA: Diagnosis not present

## 2018-01-30 DIAGNOSIS — M25552 Pain in left hip: Secondary | ICD-10-CM | POA: Diagnosis not present

## 2018-01-30 DIAGNOSIS — M545 Low back pain: Secondary | ICD-10-CM | POA: Diagnosis not present

## 2018-01-30 DIAGNOSIS — M25652 Stiffness of left hip, not elsewhere classified: Secondary | ICD-10-CM | POA: Diagnosis not present

## 2018-02-02 NOTE — Progress Notes (Signed)
Subjective:   Michelle [name] was seen in consultation in the movement disorder clinic at the request of Everrett Coombe, MD.  The evaluation is for tremor.  She is accompanied by her granddaughter who supplements the history.  Pt is a 67 y.o. female with hx of chronic pain, RA, anxiety, DM who presents with c/o tremor.  Tremor started approximately a year ago and involves the L hand.  She notes it when carrying something like a plate.  Pt was on prednisone for RA recently but tremor is independent of the prednisone.     Specific Symptoms:  Tremor: Yes.   Family hx of similar:  Yes.  , both mother and sister had tremor but no known dx Voice: "everyone says I talk too loud" Sleep: multiple awakenings to use the BR; she is sleepy during the day  Vivid Dreams:  No.  Acting out dreams:  rarely Wet Pillows: No. Postural symptoms:  Yes.    Falls?  No. but had near falls Bradykinesia symptoms: slow movements, drooling while awake and difficulty getting out of a chair Loss of smell:  No., better after septoplasty Loss of taste:  No. Urinary Incontinence:  Yes.  , urinary urgency and frequency Difficulty Swallowing:  No. Handwriting, micrographia: No. Trouble with ADL's:  No.  Trouble buttoning clothing: Yes.  , but because of RA Depression:  Yes.   Memory changes:  Yes.   Hallucinations:  No.  visual distortions: Yes.   N/V:  Yes.   (emesis with ice cream but attributes to GERD) Lightheaded:  Yes.    Syncope: No. Diplopia:  No. Dyskinesia:  No.  Neuroimaging has previously been performed.  It is available for my review today.  MRI done in 01/2013 Demonstrated scattered T2 hyperintensities, overall mild, and a developmental venous anomaly in the left frontal operculum.  05/28/17 update:  Pt seen in one year follow-up today. Tremor has been worse in the L hand and she is noting some chin/lip tremor.   I have reviewed records available to me since last visit.  She had a low B12 level done in  September. She has been following at [facility] for her cough variant asthma.  She states that she is worried about lung CA.    08/29/17 update: Patient is seen today in follow-up for essential tremor.  She is now on primidone, 50 mg daily.  She reports that tremor is good in the AM but wears off in the afternoon.  She will nearly throw it off of the plate.  Use of albuterol is variable but she doesn't think that it affects tremor.  She also c/o headache in the bilateral temporal region, pounding in quality.  Comes daily.  Better in the AM, worse in the afternoon.  Going on x 3 weeks.  No new medication in the last month.  Tried ibuprofen, tylenol, OTC "migraine" meds without relief.  Uses percocet every 6 hours from RA.  B12 was a little low and now on 1060mcg daily.  Records have been reviewed since our last visit.  She has been seen at [facility] rheumatology for rheumatoid arthritis.  She has been seen by family medicine.  Her last A1c in December, 2018 was 7.2.  02/03/18 update: Patient is seen today in follow-up for essential tremor.  Primidone was increased last visit to 50 mg twice per day.  She reports that she still has tremor but worse mid day.  She does think that the medication helps some.  Has  occasional jaw tremor.  Records have been reviewed since our last visit.  She has been recently seen for chronic pain, including hip and back pain.  Having some bladder issues.  Tried what sounds like mybetriq and it worked but was too expensive so she is trying something else that isn't helping.  [name] one week ago.  She relates it to hip issues/back issues and wonders if leg gave out.  She found that PT caused more pain.  She is having injections next week.  She is seeing Dr. Kathyrn Sheriff and Dr. Maryjean Ka.     ALLERGIES:   Allergies  Allergen Reactions  . Ace Inhibitors Cough    Occurred with liinopril  . Sulfonamide Derivatives Hives and Itching    CURRENT MEDICATIONS:  Outpatient Encounter Medications as of 02/03/2018   Medication Sig  . albuterol (VENTOLIN HFA) 108 (90 Base) MCG/ACT inhaler INHALE TWO PUFFS BY MOUTH EVERY 4 HOURS AS NEEDED FOR WHEEZING FOR SHORTNESS OF BREATH  . aspirin EC 81 MG tablet Take 81 mg by mouth daily.  . cetirizine (ZYRTEC) 10 MG tablet Take 1 tablet (10 mg total) by mouth daily.  . diclofenac sodium (VOLTAREN) 1 % GEL Apply 2 g topically 4 (four) times daily as needed (for pain).  . Dulaglutide (TRULICITY) 2.83 MO/2.9UT SOPN Inject 0.75 mg into the skin once a week.  . fluticasone (FLONASE) 50 MCG/ACT nasal spray Place 2 sprays into both nostrils daily.  Marland Kitchen gabapentin (NEURONTIN) 300 MG capsule Take 1 capsule (300 mg total) by mouth at bedtime.  Marland Kitchen glucose blood (ACCU-CHEK AVIVA) test strip Use to check blood sugar three times daily or as directed.  . hydrocortisone 1 % ointment Apply 1 application topically 2 (two) times daily.  . hydrOXYzine (ATARAX/VISTARIL) 25 MG tablet Take 25 mg by mouth as needed.  . inFLIXimab (REMICADE) 100 MG injection Inject into the vein.  Marland Kitchen leflunomide (ARAVA) 10 MG tablet Take 10 mg by mouth daily.  Marland Kitchen losartan (COZAAR) 25 MG tablet Take 1 tablet (25 mg total) by mouth daily.  . metFORMIN (GLUCOPHAGE) 1000 MG tablet Take 1 tablet (1,000 mg total) by mouth 2 (two) times daily with a meal.  . omeprazole (PRILOSEC) 40 MG capsule TAKE 1 CAPSULE BY MOUTH TWICE DAILY  . ONGLYZA 5 MG TABS tablet Take 1 tablet (5 mg total) by mouth daily.  Marland Kitchen oxyCODONE-acetaminophen (PERCOCET) 10-325 MG tablet Take 1 tablet by mouth every 6 (six) hours as needed for pain.  . primidone (MYSOLINE) 50 MG tablet Take 1 tablet (50 mg total) by mouth 2 (two) times daily.  . rosuvastatin (CRESTOR) 10 MG tablet TAKE ONE TABLET BY MOUTH ONCE DAILY  . sertraline (ZOLOFT) 50 MG tablet Take 1 tablet (50 mg total) by mouth daily.  . solifenacin (VESICARE) 5 MG tablet Take 5 mg by mouth daily.  Marland Kitchen triamcinolone (KENALOG) 0.1 % paste USE AS DIRECTED IN  MOUTH/THROAT  TO  THE  LESION  TWICE   DAILY  . nitroGLYCERIN (NITROSTAT) 0.4 MG SL tablet Place 0.4 mg under the tongue every 5 (five) minutes as needed.     No facility-administered encounter medications on file as of 02/03/2018.     PAST MEDICAL HISTORY:   Past Medical History:  Diagnosis Date  . Allergy   . Anxiety   . Asthma   . Cataract    beginning on left eye  . Diabetes mellitus   . Diverticulosis   . GERD (gastroesophageal reflux disease)   . HLD (hyperlipidemia)   .  Lung nodules   . Osteoporosis   . RA (rheumatoid arthritis) ([place])    CCP positive followed by [facility]    PAST SURGICAL HISTORY:   Past Surgical History:  Procedure Laterality Date  . ABDOMINAL HYSTERECTOMY    . APPENDECTOMY    . CARPAL TUNNEL RELEASE    . CATARACT EXTRACTION Left    with lid lift  . CHOLECYSTECTOMY    . HERNIA REPAIR    . KNEE SURGERY     x2  . LEFT AND RIGHT HEART CATHETERIZATION WITH CORONARY ANGIOGRAM N/A 08/17/2013   Procedure: LEFT AND RIGHT HEART CATHETERIZATION WITH CORONARY ANGIOGRAM;  Surgeon: Laverda Page, MD;  Location: [facility] CATH LAB;  Service: Cardiovascular;  Laterality: N/A;  . right foot bone spurs removed    . SHOULDER SURGERY     x2    SOCIAL HISTORY:   Social History   Socioeconomic History  . Marital status: Married    Spouse name: Not on file  . Number of children: 3  . Years of education: Not on file  . Highest education level: Not on file  Occupational History  . Occupation: disabled    Fish farm manager: UNEMPLOYED  Social Needs  . Financial resource strain: Not on file  . Food insecurity:    Worry: Not on file    Inability: Not on file  . Transportation needs:    Medical: Not on file    Non-medical: Not on file  Tobacco Use  . Smoking status: Former Smoker    Packs/day: 1.50    Years: 5.00    Pack years: 7.50    Types: Cigarettes    Last attempt to quit: 05/31/1994    Years since quitting: 23.6  . Smokeless tobacco: Never Used  Substance and Sexual Activity  . Alcohol use: No     Alcohol/week: 0.0 oz  . Drug use: No  . Sexual activity: Not on file  Lifestyle  . Physical activity:    Days per week: Not on file    Minutes per session: Not on file  . Stress: Not on file  Relationships  . Social connections:    Talks on phone: Not on file    Gets together: Not on file    Attends religious service: Not on file    Active member of club or organization: Not on file    Attends meetings of clubs or organizations: Not on file    Relationship status: Not on file  . Intimate partner violence:    Fear of current or ex partner: Not on file    Emotionally abused: Not on file    Physically abused: Not on file    Forced sexual activity: Not on file  Other Topics Concern  . Not on file  Social History Narrative   Pt is on [employer] [place]   Lots of family stressors including grandaughter who lives with her          FAMILY HISTORY:   Family Status  Relation Name Status  . Father  Deceased  . Mat Aunt  Deceased  . Mother  Deceased  . Brother  (Not Specified)  . Sister  (Not Specified)  . Daughter  (Not Specified)  . Sister  (Not Specified)  . Sister  (Not Specified)  . Neg Hx  (Not Specified)    ROS:  Review of Systems  Constitutional: Negative.   HENT: Negative.   Respiratory: Negative.   Gastrointestinal: Negative.   Genitourinary: Negative.  Musculoskeletal: Positive for back pain and joint pain (hip pain, L.  ankle pain, L).  Skin: Negative.   Neurological: Positive for tremors.     PHYSICAL EXAMINATION:    VITALS:   Vitals:   02/03/18 0802  BP: 110/72  Pulse: 92  SpO2: 96%  Weight: 153 lb (69.4 kg)  Height: 5\' 3"  (1.6 m)    GEN:  The patient appears stated age and is in NAD. HEENT:  Normocephalic, atraumatic.  The mucous membranes are moist. The superficial temporal arteries are without ropiness or tenderness. CV:  RRR Lungs:  CTAB Neck/HEME:  There are no carotid bruits bilaterally.  Neurological examination:  Orientation:  The patient is alert and oriented x3. Cranial nerves: There is good facial symmetry. The speech is fluent and clear. Soft palate rises symmetrically and there is no tongue deviation. Hearing is intact to conversational tone. Sensation: Sensation is intact to light touch throughout Motor: Strength is 5/5 in the bilateral upper and lower extremities.   Shoulder shrug is equal and symmetric.  There is no pronator drift.  Movement examination: Tone: There is normal tone in the upper and lower extremities. Abnormal movements: There is very minimal, low amplitude tremor in the left hand with intention.  She has no trouble with pouring water from one glass to another.  She does this without spilling it, although low amplitude tremor is noted. Coordination:  There is no decremation with RAM's, with any form of RAMS, including alternating supination and pronation of the forearm, hand opening and closing, finger taps, heel taps and toe taps. Gait and Station: The patient walks well down the hall.   LABS  Had lab work at [facility] dated January 22, 2018.  White blood cells were 8.4, hemoglobin 12.7, hematocrit 39.9 and platelets 209.  AST was 28, ALT 22, BUN 13 and creatinine 0.52.  The remainder of the chemistry was not done on that date.  Sedimentation rate was 6.  Lab Results  Component Value Date   TSH 2.160 11/26/2016   Lab Results  Component Value Date   VITAMINB12 260 03/26/2017        Chemistry      Component Value Date/Time   NA 138 11/26/2016 0943   K 4.4 11/26/2016 0943   CL 102 11/26/2016 0943   CO2 23 11/26/2016 0943   BUN 18 11/26/2016 0943   CREATININE 0.60 11/17/2017 1622   CREATININE 0.46 (L) 07/22/2016 1026      Component Value Date/Time   CALCIUM 9.1 11/26/2016 0943   ALKPHOS 88 11/26/2016 0943   AST 24 11/26/2016 0943   ALT 24 11/26/2016 0943   BILITOT 0.3 11/26/2016 0943       ASSESSMENT/PLAN:  1.   Essential Tremor.  -I told the patient that this likely represents  asymmetric essential tremor, worsened by the medications for her lungs.  She has mild tremor but it is bothersome.  She would like to try to increase primidone to 50 mg - 2 in the AM and 1 at night.    2.  B12 deficiency  -on oral supplements  3. Iron deficiency anemia  -Status post iron infusion  4.  Hip and low back pain  -Has seen multiple physicians.  Is now seeing Dr. Maryjean Ka and Dr. Kathyrn Sheriff.  This is by far her biggest issue and complaints.  States that she is considering surgical intervention.  Safety discussed today.  5. Follow up is anticipated in the next few months, sooner should new neurologic  issues arise.  Much greater than 50% of this visit was spent in counseling and coordinating care.  Total face to face time:  25     Cc:  Everrett Coombe, MD

## 2018-02-03 ENCOUNTER — Ambulatory Visit (INDEPENDENT_AMBULATORY_CARE_PROVIDER_SITE_OTHER): Payer: Medicare Other | Admitting: Neurology

## 2018-02-03 ENCOUNTER — Encounter: Payer: Self-pay | Admitting: Neurology

## 2018-02-03 VITALS — BP 110/72 | HR 92 | Ht 63.0 in | Wt 153.0 lb

## 2018-02-03 DIAGNOSIS — G8929 Other chronic pain: Secondary | ICD-10-CM

## 2018-02-03 DIAGNOSIS — M545 Low back pain: Secondary | ICD-10-CM | POA: Diagnosis not present

## 2018-02-03 DIAGNOSIS — M25552 Pain in left hip: Secondary | ICD-10-CM

## 2018-02-03 DIAGNOSIS — G250 Essential tremor: Secondary | ICD-10-CM | POA: Diagnosis not present

## 2018-02-03 MED ORDER — PRIMIDONE 50 MG PO TABS: ORAL_TABLET | ORAL | 1 refills | Status: DC

## 2018-02-03 NOTE — Patient Instructions (Signed)
1.  Increase primidone - 50 mg - 2 tablets in the AM and 1 at night.

## 2018-02-05 DIAGNOSIS — N3000 Acute cystitis without hematuria: Secondary | ICD-10-CM | POA: Diagnosis not present

## 2018-02-05 DIAGNOSIS — N3941 Urge incontinence: Secondary | ICD-10-CM | POA: Diagnosis not present

## 2018-02-05 DIAGNOSIS — R3915 Urgency of urination: Secondary | ICD-10-CM | POA: Diagnosis not present

## 2018-02-10 DIAGNOSIS — M545 Low back pain: Secondary | ICD-10-CM | POA: Diagnosis not present

## 2018-02-20 ENCOUNTER — Encounter: Payer: Self-pay | Admitting: Gastroenterology

## 2018-02-20 ENCOUNTER — Ambulatory Visit (INDEPENDENT_AMBULATORY_CARE_PROVIDER_SITE_OTHER): Payer: Medicare Other | Admitting: Gastroenterology

## 2018-02-20 VITALS — BP 132/72 | HR 63 | Ht 63.0 in | Wt 148.0 lb

## 2018-02-20 DIAGNOSIS — R634 Abnormal weight loss: Secondary | ICD-10-CM

## 2018-02-20 DIAGNOSIS — R194 Change in bowel habit: Secondary | ICD-10-CM

## 2018-02-20 DIAGNOSIS — K5900 Constipation, unspecified: Secondary | ICD-10-CM | POA: Diagnosis not present

## 2018-02-20 DIAGNOSIS — R1310 Dysphagia, unspecified: Secondary | ICD-10-CM | POA: Diagnosis not present

## 2018-02-20 MED ORDER — PEG 3350-KCL-NA BICARB-NACL 420 G PO SOLR: 4000.0000 mL | ORAL | 0 refills | Status: DC

## 2018-02-20 NOTE — Progress Notes (Signed)
Review of pertinent gastrointestinal problems:  1. EGD 2006 with Dr. Cristina Gong: This was done for Hemoccult-positive stool. He wrote about bile reflux into stomach and esophagus, possible minimal Barrett's esophagus. Biopsies of the GE junction showed mild inflammation only without any intestinal metaplasia. EGD Dr. Ardis Hughs (dyspahgia) mild pan gastritis, hpylori ++, treated. 2. Elevated risk for colon cancer (FH +): Colonoscopy January 2012 findings diverticulosis but was otherwise normal. Dr. Cristina Gong recommended she have a repeat colonoscopy at 10 year interval for her routine risk screening. Shortly after that her daughter was diagnosed with colon cancer which puts her at increased risk and recall was adjusted to 5 year interval. Colonoscopy 03/2014 Dr. Ardis Hughs (change in bowels) 2 small TAs, recall recommended at 5 years. 3. dysphagia like symptoms. April 2013 EGD by Dr. Ardis Hughs. Mild gastritis, H. pylori negative on biopsy, retained solid and liquid food without anatomic gastric outlet obstruction, this suggested gastroparesis. DM and daily narcotic medicines likely contribute,  4. abd pain 7/13: cbc, cmet, amylas, lipase were normal: CT scan abd pelvis with IV and PO contrast   HPI: This is a pleasant 67 year old woman whom I last saw about 2 years ago.  She is here today with myriad GI symptoms as well as extreme anxiety about her symptoms.  Weight is down 15 pounds in about 2 years (same scale her in office)  Myriad GI symptoms for 6 weeks: Pains in RUQ and also epigastric. Usually after she eats.  Lasts for an hour. Usually no pain by AM.   Dysphagia with solid food. She always coughs.  She has had a change in her bowels. She has constipation for several weeks now, maybe 2 months. This is a distinct change for her, usually fairly loose stools.  She feels she is loosing weight: she thinks 40-50 pounds. Her kids are very nervous and her husband is as well.  On remicade for RA  Chronic  pain tremor  Old Data Reviewed:     Review of systems: Pertinent positive and negative review of systems were noted in the above HPI section. All other review negative.   Past Medical History:  Diagnosis Date  . Allergy   . Anxiety   . Asthma   . Cataract    beginning on left eye  . Diabetes mellitus   . Diverticulosis   . GERD (gastroesophageal reflux disease)   . HLD (hyperlipidemia)   . Lung nodules   . Osteoporosis   . RA (rheumatoid arthritis) (HCC)    CCP positive followed by [facility]    Past Surgical History:  Procedure Laterality Date  . ABDOMINAL HYSTERECTOMY    . APPENDECTOMY    . CARPAL TUNNEL RELEASE    . CATARACT EXTRACTION Left    with lid lift  . CHOLECYSTECTOMY    . HERNIA REPAIR    . KNEE SURGERY     x2  . LEFT AND RIGHT HEART CATHETERIZATION WITH CORONARY ANGIOGRAM N/A 08/17/2013   Procedure: LEFT AND RIGHT HEART CATHETERIZATION WITH CORONARY ANGIOGRAM;  Surgeon: Laverda Page, MD;  Location: [facility] CATH LAB;  Service: Cardiovascular;  Laterality: N/A;  . right foot bone spurs removed    . SHOULDER SURGERY     x2    Current Outpatient Medications  Medication Sig Dispense Refill  . albuterol (VENTOLIN HFA) 108 (90 Base) MCG/ACT inhaler INHALE TWO PUFFS BY MOUTH EVERY 4 HOURS AS NEEDED FOR WHEEZING FOR SHORTNESS OF BREATH 6 Inhaler 1  . aspirin EC 81 MG tablet Take 81  mg by mouth daily.    . cetirizine (ZYRTEC) 10 MG tablet Take 1 tablet (10 mg total) by mouth daily. 90 tablet 3  . diclofenac sodium (VOLTAREN) 1 % GEL Apply 2 g topically 4 (four) times daily as needed (for pain).    . Dulaglutide (TRULICITY) 8.78 MV/6.7MC SOPN Inject 0.75 mg into the skin once a week. 12 pen 1  . fesoterodine (TOVIAZ) 4 MG TB24 tablet Take 4 mg by mouth daily.    . fluticasone (FLONASE) 50 MCG/ACT nasal spray Place 2 sprays into both nostrils daily. 48 g 3  . gabapentin (NEURONTIN) 300 MG capsule Take 1 capsule (300 mg total) by mouth at bedtime. 90 capsule 3  .  glucose blood (ACCU-CHEK AVIVA) test strip Use to check blood sugar three times daily or as directed. 100 each 12  . hydrocortisone 1 % ointment Apply 1 application topically 2 (two) times daily. 30 g 0  . hydrOXYzine (ATARAX/VISTARIL) 25 MG tablet Take 25 mg by mouth as needed.    . inFLIXimab (REMICADE) 100 MG injection Inject into the vein.    Marland Kitchen leflunomide (ARAVA) 10 MG tablet Take 10 mg by mouth daily.    Marland Kitchen losartan (COZAAR) 25 MG tablet Take 1 tablet (25 mg total) by mouth daily. 90 tablet 0  . metFORMIN (GLUCOPHAGE) 1000 MG tablet Take 1 tablet (1,000 mg total) by mouth 2 (two) times daily with a meal. 180 tablet 3  . nitroGLYCERIN (NITROSTAT) 0.4 MG SL tablet Place 0.4 mg under the tongue every 5 (five) minutes as needed.      Marland Kitchen omeprazole (PRILOSEC) 40 MG capsule TAKE 1 CAPSULE BY MOUTH TWICE DAILY 180 capsule 3  . ONGLYZA 5 MG TABS tablet Take 1 tablet (5 mg total) by mouth daily. 90 tablet 2  . oxyCODONE-acetaminophen (PERCOCET) 10-325 MG tablet Take 1 tablet by mouth every 6 (six) hours as needed for pain. 120 tablet 0  . primidone (MYSOLINE) 50 MG tablet 2 in the AM, 1 at night 270 tablet 1  . rosuvastatin (CRESTOR) 10 MG tablet TAKE ONE TABLET BY MOUTH ONCE DAILY 90 tablet 3  . sertraline (ZOLOFT) 50 MG tablet Take 1 tablet (50 mg total) by mouth daily. 90 tablet 3  . solifenacin (VESICARE) 5 MG tablet Take 5 mg by mouth daily.  0  . triamcinolone (KENALOG) 0.1 % paste USE AS DIRECTED IN  MOUTH/THROAT  TO  THE  LESION  TWICE  DAILY 5 g 1   No current facility-administered medications for this visit.     Allergies as of 02/20/2018 - Review Complete 02/20/2018  Allergen Reaction Noted  . Ace inhibitors Cough 05/31/2014  . Sulfonamide derivatives Hives and Itching     Family History  Problem Relation Age of Onset  . Stroke Father 80       died  . Colon cancer Maternal Aunt   . Lung cancer Brother 32       died  . Rheum arthritis Sister 61  . Colon cancer Daughter   .  COPD Sister   . Cancer Sister        in nose  . Esophageal cancer Neg Hx   . Rectal cancer Neg Hx   . Stomach cancer Neg Hx     Social History   Socioeconomic History  . Marital status: Married    Spouse name: Not on file  . Number of children: 3  . Years of education: Not on file  . Highest education level:  Not on file  Occupational History  . Occupation: disabled    Fish farm manager: UNEMPLOYED  Social Needs  . Financial resource strain: Not on file  . Food insecurity:    Worry: Not on file    Inability: Not on file  . Transportation needs:    Medical: Not on file    Non-medical: Not on file  Tobacco Use  . Smoking status: Former Smoker    Packs/day: 1.50    Years: 5.00    Pack years: 7.50    Types: Cigarettes    Last attempt to quit: 05/31/1994    Years since quitting: 23.7  . Smokeless tobacco: Never Used  Substance and Sexual Activity  . Alcohol use: No    Alcohol/week: 0.0 standard drinks  . Drug use: No  . Sexual activity: Not on file  Lifestyle  . Physical activity:    Days per week: Not on file    Minutes per session: Not on file  . Stress: Not on file  Relationships  . Social connections:    Talks on phone: Not on file    Gets together: Not on file    Attends religious service: Not on file    Active member of club or organization: Not on file    Attends meetings of clubs or organizations: Not on file    Relationship status: Not on file  . Intimate partner violence:    Fear of current or ex partner: Not on file    Emotionally abused: Not on file    Physically abused: Not on file    Forced sexual activity: Not on file  Other Topics Concern  . Not on file  Social History Narrative   Pt is on [employer] [place]   Lots of family stressors including grandaughter who lives with her           Physical Exam: BP 132/72   Pulse 63   Ht 5' 3"  (1.6 m)   Wt 148 lb (67.1 kg)   BMI 26.22 kg/m  Constitutional: generally well-appearing Psychiatric:  alert and oriented x3 Eyes: extraocular movements intact Mouth: oral pharynx moist, no lesions Neck: supple no lymphadenopathy Cardiovascular: heart regular rate and rhythm Lungs: clear to auscultation bilaterally Abdomen: soft, nontender, nondistended, no obvious ascites, no peritoneal signs, normal bowel sounds Extremities: no lower extremity edema bilaterally Skin: no lesions on visible extremities   Assessment and plan: 67 y.o. female with myriad upper and lower GI symptoms, significant anxiety about her symptoms  Objectively she has lost about 15 pounds since she was here 2 months ago.  She has dysphasia and a change in her bowels.  She has a lot of anxiety about her symptoms, she is very concerned that she might have underlying cancer.  I recommended that we proceed with EGD and colonoscopy at her soonest convenience for further testing    Please see the "Patient Instructions" section for addition details about the plan.   Owens Loffler, MD [facility] 02/20/2018, 3:55 PM  Cc: Everrett Coombe, MD

## 2018-02-20 NOTE — Patient Instructions (Addendum)
You will be set up for a colonoscopy for bowel change, constipation. You will be set up for an upper endoscopy for dyshagia, weight loss.  Normal BMI (Body Mass Index- based on height and weight) is between 23 and 30. Your BMI today is Body mass index is 26.22 kg/m. Michelle [name] Please consider follow up  regarding your BMI with your Primary Care Provider.

## 2018-03-03 ENCOUNTER — Other Ambulatory Visit: Payer: Self-pay

## 2018-03-03 ENCOUNTER — Encounter: Payer: Self-pay | Admitting: Student in an Organized Health Care Education/Training Program

## 2018-03-03 ENCOUNTER — Ambulatory Visit (INDEPENDENT_AMBULATORY_CARE_PROVIDER_SITE_OTHER): Payer: Medicare Other | Admitting: Student in an Organized Health Care Education/Training Program

## 2018-03-03 VITALS — BP 142/70 | HR 94 | Temp 98.4°F | Ht 63.0 in | Wt 151.4 lb

## 2018-03-03 DIAGNOSIS — K5900 Constipation, unspecified: Secondary | ICD-10-CM | POA: Insufficient documentation

## 2018-03-03 DIAGNOSIS — E119 Type 2 diabetes mellitus without complications: Secondary | ICD-10-CM

## 2018-03-03 DIAGNOSIS — J45909 Unspecified asthma, uncomplicated: Secondary | ICD-10-CM

## 2018-03-03 DIAGNOSIS — G8929 Other chronic pain: Secondary | ICD-10-CM | POA: Diagnosis not present

## 2018-03-03 DIAGNOSIS — G894 Chronic pain syndrome: Secondary | ICD-10-CM

## 2018-03-03 DIAGNOSIS — I10 Essential (primary) hypertension: Secondary | ICD-10-CM | POA: Diagnosis not present

## 2018-03-03 LAB — POCT GLYCOSYLATED HEMOGLOBIN (HGB A1C): HBA1C, POC (CONTROLLED DIABETIC RANGE): 7.4 % — AB (ref 0.0–7.0)

## 2018-03-03 MED ORDER — GLUCOSE BLOOD VI STRP: ORAL_STRIP | 12 refills | Status: DC

## 2018-03-03 MED ORDER — ALBUTEROL SULFATE HFA 108 (90 BASE) MCG/ACT IN AERS: INHALATION_SPRAY | RESPIRATORY_TRACT | 1 refills | Status: DC

## 2018-03-03 MED ORDER — OXYCODONE-ACETAMINOPHEN 10-325 MG PO TABS: 1.0000 | ORAL_TABLET | Freq: Four times a day (QID) | ORAL | 0 refills | Status: DC | PRN

## 2018-03-03 MED ORDER — ROSUVASTATIN CALCIUM 10 MG PO TABS: 10.0000 mg | ORAL_TABLET | Freq: Every day | ORAL | 3 refills | Status: DC

## 2018-03-03 MED ORDER — METFORMIN HCL 1000 MG PO TABS: 1000.0000 mg | ORAL_TABLET | Freq: Two times a day (BID) | ORAL | 3 refills | Status: DC

## 2018-03-03 MED ORDER — LOSARTAN POTASSIUM 25 MG PO TABS: 25.0000 mg | ORAL_TABLET | Freq: Every day | ORAL | 0 refills | Status: DC

## 2018-03-03 MED ORDER — POLYETHYLENE GLYCOL 3350 17 GM/SCOOP PO POWD: 17.0000 g | Freq: Two times a day (BID) | ORAL | 1 refills | Status: DC | PRN

## 2018-03-03 MED ORDER — NITROGLYCERIN 0.4 MG SL SUBL: 0.4000 mg | SUBLINGUAL_TABLET | SUBLINGUAL | 0 refills | Status: DC | PRN

## 2018-03-03 MED ORDER — OMEPRAZOLE 40 MG PO CPDR: 40.0000 mg | DELAYED_RELEASE_CAPSULE | Freq: Two times a day (BID) | ORAL | 3 refills | Status: DC

## 2018-03-03 MED ORDER — CETIRIZINE HCL 10 MG PO TABS: 10.0000 mg | ORAL_TABLET | Freq: Every day | ORAL | 3 refills | Status: DC

## 2018-03-03 NOTE — Assessment & Plan Note (Signed)
Improvement in A1c from previous.  Patient reports she is unsure just take Onglyza.  Offered to trial off of this medication and if she is able to institute lifestyle changes.  She reports that she would prefer to just stay on her current medications at this time given her ongoing back issues. -Continue current regimen -Up in 3 months for next A1c

## 2018-03-03 NOTE — Assessment & Plan Note (Signed)
Gave 2 months of postdated scripts for her Percocet.

## 2018-03-03 NOTE — Assessment & Plan Note (Signed)
May be related to chronic opioid use.  Prescription for MiraLAX was sent.  Advised patient to titrate up as needed for 1-2 soft bowel movements per day.  Stop for diarrhea.  Handout on high-fiber diet was provided.

## 2018-03-03 NOTE — Patient Instructions (Signed)
It was a pleasure seeing you today in our clinic.   Our clinic's number is [phone]. Please call with questions or concerns about what we discussed today.  Be well, Dr. Jannely Henthorn   

## 2018-03-03 NOTE — Progress Notes (Signed)
Subjective:    Michelle [name] - 67 y.o. female MRN [id number]  Date of birth: 06/13/1951  HPI  Michelle [name] is here for diabetes follow up.  Constipation - taking stool softener -Patient reports she has a history of IBS, however has been constipated more recently -Following with gastroenterology, and notes that she had an upper endoscopy scheduled for October 4 -She reports that she is gone a week without having a bowel movement, has been taking stool softeners pills every day - she started having bowel movements yetserday and had multiple, now feels constipated again - would like script for miralax  DIABETES Disease Monitoring:  HgbA1c 7.4 f rom 7.9 previously  Polyuria/phagia/dipsia- none       Visual problems- none Medications: metformin 1000 mg BID, onglyza, trulicy Compliance- 426% reported  Hypoglycemic symptoms- none She reports she is not sure why she has to take onglyza. Discussed that we could trial off of this medication if she can institute diet and lifestyle changes. She said she will hold off making any medication changes at this time.  Encounter for chronic pain Patient would like script for chronic opioid pain medication sent at today's visit so that her A1c and pain management visits line up every 3 months.   Monitoring Labs and Parameters Last A1C:  Lab Results  Component Value Date   HGBA1C 7.4 (A) 03/03/2018    Last Lipid:     Component Value Date/Time   CHOL 106 07/22/2016 1026   HDL 44 (L) 07/22/2016 1026   LDLDIRECT  08/14/2009 1225    58 (NOTE) ATP III Classification (LDL):      < 100        mg/dL         Optimal     100 - 129     mg/dL         Near or Above Optimal     130 - 159     mg/dL         Borderline High     160 - 189     mg/dL         High      > 190        mg/dL         Very  High     Last Bmet  Potassium  Date Value Ref Range Status  11/26/2016 4.4 3.5 - 5.2 mmol/L Final   Sodium  Date Value Ref Range Status  11/26/2016  138 134 - 144 mmol/L Final   Creat  Date Value Ref Range Status  07/22/2016 0.46 (L) 0.50 - 0.99 mg/dL Final    Comment:      For patients > or = 68 years of age: The upper reference limit for Creatinine is approximately 13% higher for people identified as African-American.      Creatinine, Ser  Date Value Ref Range Status  11/17/2017 0.60 0.44 - 1.00 mg/dL Final      Last BPs:  BP Readings from Last 3 Encounters:  03/03/18 (!) 142/70  02/20/18 132/72  02/03/18 110/72      Health Maintenance:  Health Maintenance Due  Topic Date Due  . FOOT EXAM  12/27/2017  . INFLUENZA VACCINE  01/29/2018    -  reports that she quit smoking about 23 years ago. Her smoking use included cigarettes. She has a 7.50 pack-year smoking history. She has never used smokeless tobacco. - Review of Systems: Per HPI. - Past Medical History:  Patient Active Problem List   Diagnosis Date Noted  . Constipation 03/03/2018  . Left hip pain 09/11/2017  . Urge incontinence of urine 04/18/2017  . Hearing loss 07/19/2015  . Squamous cell carcinoma in situ of skin 11/25/2014  . Encounter for chronic pain management 08/05/2014  . Prolonged Q-T interval on ECG 07/05/2014  . Pulmonary nodules 03/02/2014  . Essential hypertension 10/30/2012  . Irritable bowel 07/13/2012  . Restless legs 05/14/2012  . Gastroparesis 11/19/2011  . Osteoporosis, unspecified 08/08/2011  . Restrictive lung disease 08/09/2010  . Asthma, chronic 03/14/2010  . Rheumatoid arthritis ([place]) 01/08/2010  . GERD 08/22/2008  . Diabetes mellitus without complication ([place]) 12/87/8676  . Hyperlipidemia 08/28/2006  . OBESITY, NOS 08/28/2006  . Anxiety and depression 08/28/2006   - Medications: reviewed and updated Current Outpatient Medications  Medication Sig Dispense Refill  . albuterol (VENTOLIN HFA) 108 (90 Base) MCG/ACT inhaler INHALE TWO PUFFS BY MOUTH EVERY 4 HOURS AS NEEDED FOR WHEEZING FOR SHORTNESS OF BREATH 6 Inhaler 1  .  aspirin EC 81 MG tablet Take 81 mg by mouth daily.    . cetirizine (ZYRTEC) 10 MG tablet Take 1 tablet (10 mg total) by mouth daily. 90 tablet 3  . diclofenac sodium (VOLTAREN) 1 % GEL Apply 2 g topically 4 (four) times daily as needed (for pain).    . Dulaglutide (TRULICITY) 7.20 NO/7.0JG SOPN Inject 0.75 mg into the skin once a week. 12 pen 1  . fesoterodine (TOVIAZ) 4 MG TB24 tablet Take 4 mg by mouth daily.    . fluticasone (FLONASE) 50 MCG/ACT nasal spray Place 2 sprays into both nostrils daily. 48 g 3  . gabapentin (NEURONTIN) 300 MG capsule Take 1 capsule (300 mg total) by mouth at bedtime. 90 capsule 3  . glucose blood (ACCU-CHEK AVIVA) test strip Use to check blood sugar four times daily or as directed. 100 each 12  . hydrocortisone 1 % ointment Apply 1 application topically 2 (two) times daily. 30 g 0  . hydrOXYzine (ATARAX/VISTARIL) 25 MG tablet Take 25 mg by mouth as needed.    . inFLIXimab (REMICADE) 100 MG injection Inject into the vein.    Marland Kitchen leflunomide (ARAVA) 10 MG tablet Take 10 mg by mouth daily.    Marland Kitchen losartan (COZAAR) 25 MG tablet Take 1 tablet (25 mg total) by mouth daily. 90 tablet 0  . metFORMIN (GLUCOPHAGE) 1000 MG tablet Take 1 tablet (1,000 mg total) by mouth 2 (two) times daily with a meal. 180 tablet 3  . nitroGLYCERIN (NITROSTAT) 0.4 MG SL tablet Place 1 tablet (0.4 mg total) under the tongue every 5 (five) minutes as needed. 30 tablet 0  . omeprazole (PRILOSEC) 40 MG capsule Take 1 capsule (40 mg total) by mouth 2 (two) times daily. 180 capsule 3  . ONGLYZA 5 MG TABS tablet Take 1 tablet (5 mg total) by mouth daily. 90 tablet 2  . oxyCODONE-acetaminophen (PERCOCET) 10-325 MG tablet Take 1 tablet by mouth every 6 (six) hours as needed for pain. 120 tablet 0  . polyethylene glycol powder (GLYCOLAX/MIRALAX) powder Take 17 g by mouth 2 (two) times daily as needed. 3350 g 1  . polyethylene glycol-electrolytes (NULYTELY/GOLYTELY) 420 g solution Take 4,000 mLs by mouth as  directed. 4000 mL 0  . primidone (MYSOLINE) 50 MG tablet 2 in the AM, 1 at night 270 tablet 1  . rosuvastatin (CRESTOR) 10 MG tablet Take 1 tablet (10 mg total) by mouth daily. 90 tablet 3  . sertraline (  ZOLOFT) 50 MG tablet Take 1 tablet (50 mg total) by mouth daily. 90 tablet 3  . solifenacin (VESICARE) 5 MG tablet Take 5 mg by mouth daily.  0  . triamcinolone (KENALOG) 0.1 % paste USE AS DIRECTED IN  MOUTH/THROAT  TO  THE  LESION  TWICE  DAILY 5 g 1   No current facility-administered medications for this visit.     Review of Systems See HPI     Objective:   Physical Exam BP (!) 142/70   Pulse 94   Temp 98.4 F (36.9 C) (Oral)   Ht 5\' 3"  (1.6 m)   Wt 151 lb 6.4 oz (68.7 kg)   SpO2 97%   BMI 26.82 kg/m  Gen: NAD, alert, cooperative with exam, well-appearing  CV: RRR, good S1/S2, no murmur, no edema, capillary refill brisk  Resp: CTABL, no wheezes, non-labored Abd: SNTND, BS present, no guarding or organomegaly Skin: no rashes, normal turgor  Neuro: no gross deficits.  Psych: good insight, alert and oriented     Assessment & Plan:   Diabetes mellitus without complication ([place]) Improvement in A1c from previous.  Patient reports she is unsure just take Onglyza.  Offered to trial off of this medication and if she is able to institute lifestyle changes.  She reports that she would prefer to just stay on her current medications at this time given her ongoing back issues. -Continue current regimen -Up in 3 months for next A1c  Encounter for chronic pain management Gave 2 months of postdated scripts for her Percocet.  Constipation May be related to chronic opioid use.  Prescription for MiraLAX was sent.  Advised patient to titrate up as needed for 1-2 soft bowel movements per day.  Stop for diarrhea.  Handout on high-fiber diet was provided.   Orders Placed This Encounter  Procedures  . HgB A1c    Meds ordered this encounter  Medications  . albuterol (VENTOLIN HFA) 108  (90 Base) MCG/ACT inhaler    Sig: INHALE TWO PUFFS BY MOUTH EVERY 4 HOURS AS NEEDED FOR WHEEZING FOR SHORTNESS OF BREATH    Dispense:  6 Inhaler    Refill:  1  . losartan (COZAAR) 25 MG tablet    Sig: Take 1 tablet (25 mg total) by mouth daily.    Dispense:  90 tablet    Refill:  0  . cetirizine (ZYRTEC) 10 MG tablet    Sig: Take 1 tablet (10 mg total) by mouth daily.    Dispense:  90 tablet    Refill:  3  . rosuvastatin (CRESTOR) 10 MG tablet    Sig: Take 1 tablet (10 mg total) by mouth daily.    Dispense:  90 tablet    Refill:  3  . metFORMIN (GLUCOPHAGE) 1000 MG tablet    Sig: Take 1 tablet (1,000 mg total) by mouth 2 (two) times daily with a meal.    Dispense:  180 tablet    Refill:  3  . omeprazole (PRILOSEC) 40 MG capsule    Sig: Take 1 capsule (40 mg total) by mouth 2 (two) times daily.    Dispense:  180 capsule    Refill:  3  . nitroGLYCERIN (NITROSTAT) 0.4 MG SL tablet    Sig: Place 1 tablet (0.4 mg total) under the tongue every 5 (five) minutes as needed.    Dispense:  30 tablet    Refill:  0  . polyethylene glycol powder (GLYCOLAX/MIRALAX) powder    Sig: Take 17  g by mouth 2 (two) times daily as needed.    Dispense:  3350 g    Refill:  1  . glucose blood (ACCU-CHEK AVIVA) test strip    Sig: Use to check blood sugar four times daily or as directed.    Dispense:  100 each    Refill:  12    Dx: E11.9  Represcribing by [facility] provider.  Marland Kitchen DISCONTD: oxyCODONE-acetaminophen (PERCOCET) 10-325 MG tablet    Sig: Take 1 tablet by mouth every 6 (six) hours as needed for pain.    Dispense:  120 tablet    Refill:  0    Fill on or after 10/26  . oxyCODONE-acetaminophen (PERCOCET) 10-325 MG tablet    Sig: Take 1 tablet by mouth every 6 (six) hours as needed for pain.    Dispense:  120 tablet    Refill:  0    Fill on or after 11/26    Everrett Coombe, MD,MS,  PGY3 03/03/2018 9:54 AM

## 2018-03-13 DIAGNOSIS — Z23 Encounter for immunization: Secondary | ICD-10-CM | POA: Diagnosis not present

## 2018-03-16 DIAGNOSIS — M545 Low back pain: Secondary | ICD-10-CM | POA: Diagnosis not present

## 2018-03-16 DIAGNOSIS — M4316 Spondylolisthesis, lumbar region: Secondary | ICD-10-CM | POA: Diagnosis not present

## 2018-03-17 ENCOUNTER — Encounter: Payer: Self-pay | Admitting: Podiatry

## 2018-03-17 ENCOUNTER — Ambulatory Visit (INDEPENDENT_AMBULATORY_CARE_PROVIDER_SITE_OTHER): Payer: Medicare Other | Admitting: Podiatry

## 2018-03-17 DIAGNOSIS — L84 Corns and callosities: Secondary | ICD-10-CM | POA: Diagnosis not present

## 2018-03-17 DIAGNOSIS — M79674 Pain in right toe(s): Secondary | ICD-10-CM

## 2018-03-17 DIAGNOSIS — E119 Type 2 diabetes mellitus without complications: Secondary | ICD-10-CM

## 2018-03-17 DIAGNOSIS — B351 Tinea unguium: Secondary | ICD-10-CM

## 2018-03-17 DIAGNOSIS — M79675 Pain in left toe(s): Secondary | ICD-10-CM

## 2018-03-17 NOTE — Progress Notes (Signed)
The patient presents the office with chief complaint of malformed nails on both feet, especially the big toenail, left foot.  . She states that the nails are painful walking and wearing her shoes.  . She also says that she's had pain on the tip of  her third toe of the left foot walking and wearing her shoes.  .  She says this pain has been present for 2-3 weeks.  Patient presents the office today for an evaluation and treatment of her nails and callus. Patient is diabetic.    General Appearance  Alert, conversant and in no acute stress.  Vascular  Dorsalis pedis and posterior tibial  pulses are palpable  bilaterally.  Capillary return is within normal limits  bilaterally. Temperature is within normal limits  bilaterally.  Neurologic  Senn-Weinstein monofilament wire test within normal limits  bilaterally. Muscle power within normal limits bilaterally.  Nails Thick disfigured discolored nails with subungual debris  from hallux to fifth toes bilaterally. No evidence of bacterial infection or drainage bilaterally.  Orthopedic  No limitations of motion  feet .  No crepitus or effusions noted.  No bony pathology or digital deformities noted.  Skin  normotropic skin with no porokeratosis noted bilaterally.  No signs of infections or ulcers noted. Distal clavi distal aspect third toe left foot.  Onychomycosis  B/L  Clavi 3rd toe left foot.  Debride nails  X 10.  Debride clavi left foot.  Padding dispensed.  RTC prn.   Gardiner Barefoot DPM

## 2018-03-19 DIAGNOSIS — M059 Rheumatoid arthritis with rheumatoid factor, unspecified: Secondary | ICD-10-CM | POA: Diagnosis not present

## 2018-03-30 DIAGNOSIS — K219 Gastro-esophageal reflux disease without esophagitis: Secondary | ICD-10-CM | POA: Diagnosis not present

## 2018-04-01 ENCOUNTER — Other Ambulatory Visit: Payer: Self-pay

## 2018-04-01 DIAGNOSIS — Z6826 Body mass index (BMI) 26.0-26.9, adult: Secondary | ICD-10-CM | POA: Diagnosis not present

## 2018-04-01 DIAGNOSIS — M4316 Spondylolisthesis, lumbar region: Secondary | ICD-10-CM | POA: Diagnosis not present

## 2018-04-01 DIAGNOSIS — I10 Essential (primary) hypertension: Secondary | ICD-10-CM | POA: Diagnosis not present

## 2018-04-02 ENCOUNTER — Other Ambulatory Visit: Payer: Self-pay | Admitting: Neurosurgery

## 2018-04-02 MED ORDER — NITROGLYCERIN 0.4 MG SL SUBL: 0.4000 mg | SUBLINGUAL_TABLET | SUBLINGUAL | 0 refills | Status: DC | PRN

## 2018-04-03 ENCOUNTER — Ambulatory Visit (AMBULATORY_SURGERY_CENTER): Payer: Medicare Other | Admitting: Gastroenterology

## 2018-04-03 ENCOUNTER — Encounter: Payer: Self-pay | Admitting: Gastroenterology

## 2018-04-03 VITALS — BP 113/66 | HR 90 | Temp 97.1°F | Resp 10 | Ht 63.0 in | Wt 151.0 lb

## 2018-04-03 DIAGNOSIS — K219 Gastro-esophageal reflux disease without esophagitis: Secondary | ICD-10-CM | POA: Diagnosis not present

## 2018-04-03 DIAGNOSIS — E119 Type 2 diabetes mellitus without complications: Secondary | ICD-10-CM | POA: Diagnosis not present

## 2018-04-03 DIAGNOSIS — D123 Benign neoplasm of transverse colon: Secondary | ICD-10-CM

## 2018-04-03 DIAGNOSIS — Z1211 Encounter for screening for malignant neoplasm of colon: Secondary | ICD-10-CM | POA: Diagnosis not present

## 2018-04-03 DIAGNOSIS — R194 Change in bowel habit: Secondary | ICD-10-CM | POA: Diagnosis not present

## 2018-04-03 DIAGNOSIS — K2970 Gastritis, unspecified, without bleeding: Secondary | ICD-10-CM | POA: Diagnosis not present

## 2018-04-03 DIAGNOSIS — R1310 Dysphagia, unspecified: Secondary | ICD-10-CM | POA: Diagnosis not present

## 2018-04-03 DIAGNOSIS — K2950 Unspecified chronic gastritis without bleeding: Secondary | ICD-10-CM | POA: Diagnosis not present

## 2018-04-03 DIAGNOSIS — D122 Benign neoplasm of ascending colon: Secondary | ICD-10-CM | POA: Diagnosis not present

## 2018-04-03 DIAGNOSIS — I10 Essential (primary) hypertension: Secondary | ICD-10-CM | POA: Diagnosis not present

## 2018-04-03 DIAGNOSIS — K5730 Diverticulosis of large intestine without perforation or abscess without bleeding: Secondary | ICD-10-CM

## 2018-04-03 MED ORDER — SODIUM CHLORIDE 0.9 % IV SOLN: 500.0000 mL | Freq: Once | INTRAVENOUS | Status: DC

## 2018-04-03 NOTE — Op Note (Signed)
[place] Patient Name: Michelle [name] Procedure Date: 04/03/2018 2:42 PM MRN: [id number] Endoscopist: Milus Banister , MD Age: 67 Referring MD:  Date of Birth: 03-19-1951 Gender: Female Account #: [id number] Procedure:                Colonoscopy Indications:              Change in bowel habits; Colonoscopy January                            2072findings diverticulosis but was otherwise                            normal. Dr. Cristina Gong recommended she have a repeat                            colonoscopy at 10 year interval for her routine                            risk screening. Shortly after that her daughter was                            diagnosed with colon cancer which puts her at                            increased risk and recall was adjusted to 5 year                            interval. Colonoscopy 03/2014 Dr. Ardis Hughs (change                            in bowels) 2 small TAs, recall recommended at 5                            years. Medicines:                Monitored Anesthesia Care Procedure:                Pre-Anesthesia Assessment:                           - Prior to the procedure, a History and Physical                            was performed, and patient medications and                            allergies were reviewed. The patient's tolerance of                            previous anesthesia was also reviewed. The risks                            and benefits of the procedure and the sedation  options and risks were discussed with the patient.                            All questions were answered, and informed consent                            was obtained. Prior Anticoagulants: The patient has                            taken no previous anticoagulant or antiplatelet                            agents. ASA Grade Assessment: II - A patient with                            mild systemic disease. After reviewing the risks                          and benefits, the patient was deemed in                            satisfactory condition to undergo the procedure.                           After obtaining informed consent, the colonoscope                            was passed under direct vision. Throughout the                            procedure, the patient's blood pressure, pulse, and                            oxygen saturations were monitored continuously. The                            Colonoscope was introduced through the anus and                            advanced to the the cecum, identified by                            appendiceal orifice and ileocecal valve. The                            colonoscopy was performed without difficulty. The                            patient tolerated the procedure well. The quality                            of the bowel preparation was good. The ileocecal  valve, appendiceal orifice, and rectum were                            photographed. Scope In: 3:07:01 PM Scope Out: 3:16:24 PM Scope Withdrawal Time: 0 hours 7 minutes 23 seconds  Total Procedure Duration: 0 hours 9 minutes 23 seconds  Findings:                 A 3 mm polyp was found in the ascending colon. The                            polyp was sessile. The polyp was removed with a                            cold snare. Resection and retrieval were complete.                           Multiple small-mouthed diverticula were found in                            the left colon.                           The exam was otherwise without abnormality on                            direct and retroflexion views. Complications:            No immediate complications. Estimated blood loss:                            None. Estimated Blood Loss:     Estimated blood loss: none. Impression:               - One 3 mm polyp in the ascending colon, removed                            with a cold snare.  Resected and retrieved.                           - Diverticulosis in the left colon.                           - The examination was otherwise normal on direct                            and retroflexion views. Recommendation:           - Patient has a contact number available for                            emergencies. The signs and symptoms of potential                            delayed complications were discussed with the  patient. Return to normal activities tomorrow.                            Written discharge instructions were provided to the                            patient.                           - Resume previous diet.                           - Continue present medications.                           You will receive a letter within 2-3 weeks with the                            pathology results and my final recommendations.                           If the polyp(s) is proven to be 'pre-cancerous' on                            pathology, you will need repeat colonoscopy in 5                            years. Milus Banister, MD 04/03/2018 3:22:53 PM This report has been signed electronically.

## 2018-04-03 NOTE — Op Note (Signed)
[place] Patient Name: Michelle [name] Procedure Date: 04/03/2018 2:43 PM MRN: [id number] Endoscopist: Milus Banister , MD Age: 67 Referring MD:  Date of Birth: [birth year] Gender: Female Account #: [id number] Procedure:                Upper GI endoscopy Indications:              Dysphagia Medicines:                Monitored Anesthesia Care Procedure:                Pre-Anesthesia Assessment:                           - Prior to the procedure, a History and Physical                            was performed, and patient medications and                            allergies were reviewed. The patient's tolerance of                            previous anesthesia was also reviewed. The risks                            and benefits of the procedure and the sedation                            options and risks were discussed with the patient.                            All questions were answered, and informed consent                            was obtained. Prior Anticoagulants: The patient has                            taken no previous anticoagulant or antiplatelet                            agents. ASA Grade Assessment: II - A patient with                            mild systemic disease. After reviewing the risks                            and benefits, the patient was deemed in                            satisfactory condition to undergo the procedure.                           - Prior to the procedure, a History and Physical  was performed, and patient medications and                            allergies were reviewed. The patient's tolerance of                            previous anesthesia was also reviewed. The risks                            and benefits of the procedure and the sedation                            options and risks were discussed with the patient.                            All questions were answered, and informed consent                             was obtained. Prior Anticoagulants: The patient has                            taken no previous anticoagulant or antiplatelet                            agents. ASA Grade Assessment: II - A patient with                            mild systemic disease. After reviewing the risks                            and benefits, the patient was deemed in                            satisfactory condition to undergo the procedure.                           After obtaining informed consent, the endoscope was                            passed under direct vision. Throughout the                            procedure, the patient's blood pressure, pulse, and                            oxygen saturations were monitored continuously. The                            Model GIF-HQ190 [id number]) scope was introduced                            through the mouth, and advanced to the second part  of duodenum. The upper GI endoscopy was                            accomplished without difficulty. The patient                            tolerated the procedure well. Scope In: Scope Out: Findings:                 Mild inflammation characterized by erythema was                            found in the gastric antrum. Biopsies were taken                            with a cold forceps for histology.                           A small hiatal hernia was present.                           The exam was otherwise without abnormality. Complications:            No immediate complications. Estimated blood loss:                            None. Estimated Blood Loss:     Estimated blood loss: none. Impression:               - Mild, non-specific gastritis. Biopsied to check                            for recurrent/residual H. pylori.                           - Small hiatal hernia.                           - The examination was otherwise normal. Recommendation:           -  Patient has a contact number available for                            emergencies. The signs and symptoms of potential                            delayed complications were discussed with the                            patient. Return to normal activities tomorrow.                            Written discharge instructions were provided to the                            patient.                           -  Resume previous diet.                           - Continue present medications.                           - Await pathology results. Milus Banister, MD 04/03/2018 3:24:39 PM This report has been signed electronically.

## 2018-04-03 NOTE — Patient Instructions (Signed)
YOU HAD AN ENDOSCOPIC PROCEDURE TODAY AT THE [facility]:   Refer to the procedure report that was given to you for any specific questions about what was found during the examination.  If the procedure report does not answer your questions, please call your gastroenterologist to clarify.  If you requested that your care partner not be given the details of your procedure findings, then the procedure report has been included in a sealed envelope for you to review at your convenience later.  YOU SHOULD EXPECT: Some feelings of bloating in the abdomen. Passage of more gas than usual.  Walking can help get rid of the air that was put into your GI tract during the procedure and reduce the bloating. If you had a lower endoscopy (such as a colonoscopy or flexible sigmoidoscopy) you may notice spotting of blood in your stool or on the toilet paper. If you underwent a bowel prep for your procedure, you may not have a normal bowel movement for a few days.  Please Note:  You might notice some irritation and congestion in your nose or some drainage.  This is from the oxygen used during your procedure.  There is no need for concern and it should clear up in a day or so.  SYMPTOMS TO REPORT IMMEDIATELY:   Following lower endoscopy (colonoscopy or flexible sigmoidoscopy):  Excessive amounts of blood in the stool  Significant tenderness or worsening of abdominal pains  Swelling of the abdomen that is new, acute  Fever of 100F or higher   Following upper endoscopy (EGD)  Vomiting of blood or coffee ground material  New chest pain or pain under the shoulder blades  Painful or persistently difficult swallowing  New shortness of breath  Fever of 100F or higher  Black, tarry-looking stools  For urgent or emergent issues, a gastroenterologist can be reached at any hour by calling [phone].   DIET:  We do recommend a small meal at first, but then you may proceed to your regular diet.  Drink  plenty of fluids but you should avoid alcoholic beverages for 24 hours.  ACTIVITY:  You should plan to take it easy for the rest of today and you should NOT DRIVE or use heavy machinery until tomorrow (because of the sedation medicines used during the test).    FOLLOW UP: Our staff will call the number listed on your records the next business day following your procedure to check on you and address any questions or concerns that you may have regarding the information given to you following your procedure. If we do not reach you, we will leave a message.  However, if you are feeling well and you are not experiencing any problems, there is no need to return our call.  We will assume that you have returned to your regular daily activities without incident.  If any biopsies were taken you will be contacted by phone or by letter within the next 1-3 weeks.  Please call us at [phone] if you have not heard about the biopsies in 3 weeks.    SIGNATURES/CONFIDENTIALITY: You and/or your care partner have signed paperwork which will be entered into your electronic medical record.  These signatures attest to the fact that that the information above on your After Visit Summary has been reviewed and is understood.  Full responsibility of the confidentiality of this discharge information lies with you and/or your care-partner. 

## 2018-04-03 NOTE — Progress Notes (Signed)
Report given to PACU, vss 

## 2018-04-03 NOTE — Progress Notes (Signed)
Called to room to assist during endoscopic procedure.  Patient ID and intended procedure confirmed with present staff. Received instructions for my participation in the procedure from the performing physician.  

## 2018-04-04 HISTORY — PX: OTHER SURGICAL HISTORY: SHX169

## 2018-04-06 ENCOUNTER — Other Ambulatory Visit: Payer: Self-pay

## 2018-04-06 ENCOUNTER — Encounter (HOSPITAL_COMMUNITY): Payer: Self-pay | Admitting: *Deleted

## 2018-04-06 ENCOUNTER — Other Ambulatory Visit: Payer: Self-pay | Admitting: Neurosurgery

## 2018-04-06 ENCOUNTER — Telehealth: Payer: Self-pay

## 2018-04-06 NOTE — Telephone Encounter (Signed)
  Follow up Call-  Call back number 04/03/2018  Post procedure Call Back phone  # [phone]  Permission to leave phone message Yes  Some recent data might be hidden     Patient questions:  Do you have a fever, pain , or abdominal swelling? No. Pain Score  0 *  Have you tolerated food without any problems? Yes.    Have you been able to return to your normal activities? Yes.    Do you have any questions about your discharge instructions: Diet   No. Medications  No. Follow up visit  No.  Do you have questions or concerns about your Care? No.  Actions: * If pain score is 4 or above: No action needed, pain <4.

## 2018-04-06 NOTE — H&P (Signed)
Chief Complaint   Back pain  HPI   HPI: Michelle [name] is a 67 y.o. female with long standing history of back and left greater than right lower extremity pain. She has been treating her symptoms conservatively with po medications, PT an ESI. Unfortunately, despite conservative treatment, her pain has worsened. She presents today for surgery. She is without any concerns.  Patient Active Problem List   Diagnosis Date Noted  . Constipation 03/03/2018  . Left hip pain 09/11/2017  . Urge incontinence of urine 04/18/2017  . Hearing loss 07/19/2015  . Squamous cell carcinoma in situ of skin 11/25/2014  . Encounter for chronic pain management 08/05/2014  . Prolonged Q-T interval on ECG 07/05/2014  . Pulmonary nodules 03/02/2014  . Essential hypertension 10/30/2012  . Irritable bowel 07/13/2012  . Restless legs 05/14/2012  . Gastroparesis 11/19/2011  . Osteoporosis, unspecified 08/08/2011  . Restrictive lung disease 08/09/2010  . Asthma, chronic 03/14/2010  . Rheumatoid arthritis ([place]) 01/08/2010  . GERD 08/22/2008  . Diabetes mellitus without complication ([place]) 18/84/1660  . Hyperlipidemia 08/28/2006  . OBESITY, NOS 08/28/2006  . Anxiety and depression 08/28/2006    PMH: Past Medical History:  Diagnosis Date  . Allergy   . Anxiety   . Asthma   . Cancer ([place])    skin CA  . Cataract    beginning on left eye  . Diabetes mellitus   . Diverticulosis   . GERD (gastroesophageal reflux disease)   . HLD (hyperlipidemia)   . Hypertension   . Lung nodules   . Osteoporosis   . RA (rheumatoid arthritis) ([facility])    CCP positive followed by [facility]    PSH: Past Surgical History:  Procedure Laterality Date  . ABDOMINAL HYSTERECTOMY    . APPENDECTOMY    . CARPAL TUNNEL RELEASE    . CATARACT EXTRACTION Left    with lid lift  . CHOLECYSTECTOMY    . COLONOSCOPY    . HERNIA REPAIR    . KNEE SURGERY     x2  . LEFT AND RIGHT HEART CATHETERIZATION WITH CORONARY ANGIOGRAM N/A 08/17/2013    Procedure: LEFT AND RIGHT HEART CATHETERIZATION WITH CORONARY ANGIOGRAM;  Surgeon: Laverda Page, MD;  Location: [facility] CATH LAB;  Service: Cardiovascular;  Laterality: N/A;  . right foot bone spurs removed    . SHOULDER SURGERY     x2  . UPPER GASTROINTESTINAL ENDOSCOPY      No medications prior to admission.    SH: Social History   Tobacco Use  . Smoking status: Former Smoker    Packs/day: 1.50    Years: 5.00    Pack years: 7.50    Types: Cigarettes    Last attempt to quit: 05/31/1994    Years since quitting: 23.8  . Smokeless tobacco: Never Used  Substance Use Topics  . Alcohol use: No    Alcohol/week: 0.0 standard drinks  . Drug use: No    MEDS: Prior to Admission medications   Medication Sig Start Date End Date Taking? Authorizing Provider  albuterol (VENTOLIN HFA) 108 (90 Base) MCG/ACT inhaler INHALE TWO PUFFS BY MOUTH EVERY 4 HOURS AS NEEDED FOR WHEEZING FOR SHORTNESS OF BREATH 03/03/18  Yes Everrett Coombe, MD  azelastine (OPTIVAR) 0.05 % ophthalmic solution Place 1 drop into both eyes 2 (two) times daily as needed (itchy eyes.).  03/15/18  Yes [provider]  cetirizine (ZYRTEC) 10 MG tablet Take 1 tablet (10 mg total) by mouth daily. Patient taking differently: Take 10  mg by mouth at bedtime.  03/03/18  Yes Everrett Coombe, MD  diclofenac sodium (VOLTAREN) 1 % GEL Apply 2 g topically 4 (four) times daily as needed (for pain).   Yes [provider]  Dulaglutide (TRULICITY) 7.65 YY/5.0PT SOPN Inject 0.75 mg into the skin once a week. Patient taking differently: Inject 0.75 mg into the skin every Tuesday.  12/01/17  Yes Mikell, Jeani Sow, MD  fluticasone (FLONASE) 50 MCG/ACT nasal spray Place 2 sprays into both nostrils daily. Patient taking differently: Place 2 sprays into both nostrils daily as needed for allergies.  11/18/17  Yes Everrett Coombe, MD  hydrocortisone 1 % ointment Apply 1 application topically 2 (two) times daily. Patient taking differently:  Apply 1 application topically 2 (two) times daily as needed (for itching.).  09/10/17  Yes Rogue Bussing, MD  hydrOXYzine (ATARAX/VISTARIL) 25 MG tablet Take 25 mg by mouth every 8 (eight) hours as needed for itching.    Yes [provider]  inFLIXimab (REMICADE) 100 MG injection Inject 100 mg into the vein every 7 (seven) weeks.    Yes [provider]  leflunomide (ARAVA) 10 MG tablet Take 10 mg by mouth daily.   Yes [provider]  losartan (COZAAR) 25 MG tablet Take 1 tablet (25 mg total) by mouth daily. 03/03/18  Yes Everrett Coombe, MD  metFORMIN (GLUCOPHAGE) 1000 MG tablet Take 1 tablet (1,000 mg total) by mouth 2 (two) times daily with a meal. 03/03/18  Yes Everrett Coombe, MD  montelukast (SINGULAIR) 10 MG tablet Take 10 mg by mouth daily. 03/30/18  Yes [provider]  nitroGLYCERIN (NITROSTAT) 0.4 MG SL tablet Place 1 tablet (0.4 mg total) under the tongue every 5 (five) minutes as needed. Patient taking differently: Place 0.4 mg under the tongue every 5 (five) minutes as needed for chest pain.  04/02/18  Yes Everrett Coombe, MD  omeprazole (PRILOSEC) 40 MG capsule Take 1 capsule (40 mg total) by mouth 2 (two) times daily. Patient taking differently: Take 40 mg by mouth daily.  03/03/18  Yes Everrett Coombe, MD  ONGLYZA 5 MG TABS tablet Take 5 mg by mouth daily. 04/02/18  Yes [provider]  oxyCODONE-acetaminophen (PERCOCET) 10-325 MG tablet Take 1 tablet by mouth every 6 (six) hours as needed for pain. 03/03/18  Yes Everrett Coombe, MD  rosuvastatin (CRESTOR) 10 MG tablet Take 1 tablet (10 mg total) by mouth daily. Patient taking differently: Take 10 mg by mouth at bedtime.  03/03/18  Yes Everrett Coombe, MD  sertraline (ZOLOFT) 50 MG tablet Take 1 tablet (50 mg total) by mouth daily. Patient taking differently: Take 50 mg by mouth at bedtime.  11/28/17  Yes Mikell, Jeani Sow, MD  triamcinolone (KENALOG) 0.1 % paste USE AS DIRECTED IN  MOUTH/THROAT  TO  THE   LESION  TWICE  DAILY Patient taking differently: Use as directed 1 application in the mouth or throat 2 (two) times daily as needed (lesion).  01/08/16  Yes Vivi Barrack, MD  aspirin EC 81 MG tablet Take 81 mg by mouth daily.    [provider]  gabapentin (NEURONTIN) 300 MG capsule Take 1 capsule (300 mg total) by mouth at bedtime. Patient not taking: Reported on 04/03/2018 09/24/17   Everrett Coombe, MD  glucose blood (ACCU-CHEK AVIVA) test strip Use to check blood sugar four times daily or as directed. 03/03/18   Zenia Resides, MD  polyethylene glycol powder (GLYCOLAX/MIRALAX) powder Take 17 g by mouth 2 (two)  times daily as needed. Patient not taking: Reported on 04/03/2018 03/03/18   Everrett Coombe, MD  primidone (MYSOLINE) 50 MG tablet 2 in the AM, 1 at night Patient not taking: Reported on 04/03/2018 02/03/18   Tat, Eustace Quail, DO  ranitidine (ZANTAC) 150 MG tablet Take by mouth. 07/03/16   [provider]  triamcinolone cream (KENALOG) 0.1 % Apply 1 application topically 2 (two) times daily as needed (for skin irritation).  08/11/13   [provider]    ALLERGY: Allergies  Allergen Reactions  . Ace Inhibitors Cough    Occurred with liinopril  . Sulfonamide Derivatives Hives and Itching  . Levofloxacin     Felt things on her legs that were not there,made her stomach hurt  . Zofran [Ondansetron Hcl] Other (See Comments)    Prolong QT- pt unsure of this allergy    Social History   Tobacco Use  . Smoking status: Former Smoker    Packs/day: 1.50    Years: 5.00    Pack years: 7.50    Types: Cigarettes    Last attempt to quit: 05/31/1994    Years since quitting: 23.8  . Smokeless tobacco: Never Used  Substance Use Topics  . Alcohol use: No    Alcohol/week: 0.0 standard drinks     Family History  Problem Relation Age of Onset  . Stroke Father 85       died  . Colon cancer Maternal Aunt   . Lung cancer Brother 18       died  . Rheum arthritis Sister 90  .  Colon cancer Daughter   . COPD Sister   . Cancer Sister        in nose  . Esophageal cancer Neg Hx   . Rectal cancer Neg Hx   . Stomach cancer Neg Hx      ROS   ROS  Exam   There were no vitals filed for this visit. General appearance: WDWN, NAD Eyes: PERRL, Fundoscopic: normal Cardiovascular: Regular rate and rhythm without murmurs, rubs, gallops. No edema or variciosities. Distal pulses normal. Pulmonary: Clear to auscultation Musculoskeletal:     Muscle tone upper extremities: Normal    Muscle tone lower extremities: Normal    Motor exam: Upper Extremities Deltoid Bicep Tricep Grip  Right 5/5 5/5 5/5 5/5  Left 5/5 5/5 5/5 5/5   Lower Extremity IP Quad PF DF EHL  Right 5/5 5/5 5/5 5/5 5/5  Left 5/5 5/5 5/5 5/5 5/5   Neurological Awake, alert, oriented Memory and concentration grossly intact Speech fluent, appropriate CNII: Visual fields normal CNIII/IV/VI: EOMI CNV: Facial sensation normal CNVII: Symmetric, normal strength CNVIII: Grossly normal CNIX: Normal palate movement CNXI: Trap and SCM strength normal CN XII: Tongue protrusion normal Sensation grossly intact to LT DTR: Normal Coordination (finger/nose & heel/shin): Normal  Results - Imaging/Labs   No results found for this or any previous visit (from the past 48 hour(s)).  No results found.  IMAGING: Previous MRI dated June 2019 was again reviewed.  This demonstrates primary pathology at L4-5 where there is trace grade 1 anterolisthesis.  There is broad-based disc bulge with severe bilateral facet arthropathy.  This results in moderately severe central and bilateral lateral recess stenosis.  Impression/Plan   67 y.o. female with back and left greater than right leg pain related to multifactorial stenosis with spondylolisthesis L4-5.  She has failed conservative treatment to date. We will plan on proceeding with decompression and fusion at L4-5 including placement  of intervertebral devices, interbody  arthrodesis, and placement of posterior nonsegmental instrumentation using cortical pedicle screws.  While in the office the risks which include but are not limited to nerve root injury leading to leg or foot weakness/numbness and/or bowel and bladder dysfunction, CSF leak, bleeding, and infection.  Possible outcomes of surgery were also discussed including the possibility of persistence or worsening of pain symptoms and the possibility of accelerated adjacent level degeneration. The general risks of anesthesia were also reviewed including heart attack, stroke, and DVT/PE.  The patient understood our discussion and is willing to proceed with surgical decompression and fusion.  All questions were answered.

## 2018-04-06 NOTE — Progress Notes (Signed)
Michelle [name] denies chest pain or shortness of breath.  Patient states that she has not seen Dr Maryjean Ka in a couple years. Michelle [name] reports that CBG's run 115- 130.  I instructed patient to check CBG after awaking and every 2 hours until arrival  to the hospital.  I Instructed patient if CBG is less than 70 to drink Glucose Gel or 1/2 cup of a clear juice. Recheck CBG in 15 minutes then call pre- op desk at [phone] for further instructions.

## 2018-04-06 NOTE — Progress Notes (Signed)
Anesthesia Chart Review: SAME DAY WORKUP   Case:  [id number] Date/Time:  04/07/18 0900   Procedure:  POSTERIOR LUMBAR INTERBODY FUSION LUMBAR 4- LUMBAR 5, INTERBODY DEVICE, INTERBODY ARTHRODESIS, POSTERIOR NON-SGMENTAL INSTRUMENTATION (N/A ) - POSTERIOR LUMBAR INTERBODY FUSION LUMBAR 4- LUMBAR 5, INTERBODY DEVICE, INTERBODY ARTHRODESIS, POSTERIOR NON-SGMENTAL INSTRUMENTATION   Anesthesia type:  General   Pre-op diagnosis:  SPONDYLOLISTHESIS, LUMBAR REGION   Location:  [facility] OR ROOM 20 / [place]   Surgeon:  Consuella Lose, MD      DISCUSSION: 67 yo female former smoker. Pertinent hx includes Rheumatoid arthritis, DMII, HTN, Cough variant asthma, GERD.  In 2015 pt had workup by cardiology for SOB, CP, DOE. Per Dr. Irven Shelling notes from that time she had a low risk Lexiscan in 2013, Echo with EF 45-50% in 2013, Cath with no significant CAD in 2015. Per Dr. Irven Shelling note 09/15/2013 her symptoms were improved and he felt her SOB was due to asthma. She was advised to continue with PCP and f/u with cardiology PRN.    Review of pulmonology notes in care everywhere indicate that around the same time in 2015 the pt was found to have methotrexate induced lung toxicity which completely resolved when mtx was stopped. She continues with pulmonology for management of cough variant asthma and with rheumatology for management of RA.  Anticipate she can proceed as planned barring acute status change and DOS labs acceptable.   VS: There were no vitals taken for this visit.  PROVIDERS: Everrett Coombe, MD is PCP  Blima Dessert, MD is Rheumatologist  Dara Hoyer, MD is Pulmonoligst  LABS: A1c 7.4 on 03/03/2018. Most recent labs from care everywhere displayed below. Will need DOS labs  CBC w/ DifferentialResulted: 03/19/2018 11:31 AM [facility] Component Name Value Ref Range  WBC 8.6 4.5 - 11 10*9/L  RBC 4.43 4 - 5.2 10*12/L  HGB 13.5 12 - 16 g/dL  HCT 42.0 36 - 46 %  MCV 94.8 80 - 100 fL  MCH 30.4 26 -  34 pg  MCHC 32.1 31 - 37 g/dL  RDW 13.9 12 - 15 %  MPV 7.8 7 - 10 fL  Platelet 261 150 - 440 10*9/L  Neutrophils % 58.3 %  Lymphocytes % 32.0 %  Monocytes % 6.2 %  Eosinophils % 0.7 %  Basophils % 0.3 %  Absolute Neutrophils 5.0 2 - 7.5 10*9/L  Absolute Lymphocytes 2.8 1.5 - 5 10*9/L  Absolute Monocytes 0.5 0.2 - 0.8 10*9/L  Absolute Eosinophils 0.1 0 - 0.4 10*9/L  Absolute Basophils 0.0 0 - 0.1 10*9/L  Large Unstained Cells 3 0 - 4 %     IMAGES: CT Abd/pelvis 11/17/2017: IMPRESSION: 1. No acute process in the abdomen or pelvis. No evidence of diverticulitis or explanation for left lower quadrant pain. 2. Hepatic steatosis and hepatomegaly. 3.  Aortic Atherosclerosis (ICD10-I70.0).  CT Chest 12/05/2016 (care everywhere):  FINDINGS: AIRWAYS, LUNGS, PLEURA:  Clear central airways.  Bilateral sub-5 mm pulmonary nodules are unchanged (2:24, 40, 48 and 49). No new nodule.  No pleural effusion.  MEDIASTINUM:  Normal heart size. Coronary atherosclerosis. No pericardial effusion.  Normal caliber thoracic aorta with scattered calcifications.  No lymphadenopathy.  IMAGED ABDOMEN: Cholecystectomy.   SOFT TISSUES: Unremarkable.  BONES: Degenerative disc disease.   EKG: Will need updated EKG DOS  07/05/2014: Normal sinus rhythm. Prolonged QT (QT/QTc 404/483). No significant change since last tracing.  CV: Cath 08/17/2013: IMPRESSIONS:  1. No significant coronary artery disease, mild diffuse luminal irregularity  constituting around 10-20%. right dominant circulation. LVEF: Low normal LVEF, 50% with mild global hypokinesis. 2. Right heart cath revealing: Normal right heart pressure, no evidence of pulmonary hypertension, result cardio output and cardiac index. QP/QS was normal at 0.9 suggesting no significant right to left shunting.   Lexiscan 01/31/2012: Prominent gut uptake artifact.  No definite ischemia.  Low risk.  EF 63%.  Echo 7/58/8325: Mild LV systolic dysfunction  EF 45 to 50%.  Mild diastolic dysfunction.  Mild LA enlargement.  Past Medical History:  Diagnosis Date  . Allergy   . Anxiety   . Asthma   . Cancer ([place])    skin CA  . Cataract    beginning on left eye  . Diabetes mellitus   . Diverticulosis   . GERD (gastroesophageal reflux disease)   . HLD (hyperlipidemia)   . Hypertension   . Lung nodules   . Osteoporosis   . RA (rheumatoid arthritis) ([facility])    CCP positive followed by [facility]    Past Surgical History:  Procedure Laterality Date  . ABDOMINAL HYSTERECTOMY    . APPENDECTOMY    . CARPAL TUNNEL RELEASE    . CATARACT EXTRACTION Left    with lid lift  . CHOLECYSTECTOMY    . COLONOSCOPY    . HERNIA REPAIR    . KNEE SURGERY     x2  . LEFT AND RIGHT HEART CATHETERIZATION WITH CORONARY ANGIOGRAM N/A 08/17/2013   Procedure: LEFT AND RIGHT HEART CATHETERIZATION WITH CORONARY ANGIOGRAM;  Surgeon: Laverda Page, MD;  Location: [facility] CATH LAB;  Service: Cardiovascular;  Laterality: N/A;  . right foot bone spurs removed    . SHOULDER SURGERY     x2  . UPPER GASTROINTESTINAL ENDOSCOPY      MEDICATIONS: No current facility-administered medications for this encounter.    Marland Kitchen albuterol (VENTOLIN HFA) 108 (90 Base) MCG/ACT inhaler  . azelastine (OPTIVAR) 0.05 % ophthalmic solution  . cetirizine (ZYRTEC) 10 MG tablet  . diclofenac sodium (VOLTAREN) 1 % GEL  . Dulaglutide (TRULICITY) 4.98 YM/4.1RA SOPN  . fluticasone (FLONASE) 50 MCG/ACT nasal spray  . hydrocortisone 1 % ointment  . hydrOXYzine (ATARAX/VISTARIL) 25 MG tablet  . inFLIXimab (REMICADE) 100 MG injection  . leflunomide (ARAVA) 10 MG tablet  . losartan (COZAAR) 25 MG tablet  . metFORMIN (GLUCOPHAGE) 1000 MG tablet  . montelukast (SINGULAIR) 10 MG tablet  . nitroGLYCERIN (NITROSTAT) 0.4 MG SL tablet  . omeprazole (PRILOSEC) 40 MG capsule  . ONGLYZA 5 MG TABS tablet  . oxyCODONE-acetaminophen (PERCOCET) 10-325 MG tablet  . rosuvastatin (CRESTOR) 10 MG tablet  .  sertraline (ZOLOFT) 50 MG tablet  . triamcinolone (KENALOG) 0.1 % paste  . aspirin EC 81 MG tablet  . gabapentin (NEURONTIN) 300 MG capsule  . glucose blood (ACCU-CHEK AVIVA) test strip  . polyethylene glycol powder (GLYCOLAX/MIRALAX) powder  . primidone (MYSOLINE) 50 MG tablet  . ranitidine (ZANTAC) 150 MG tablet  . triamcinolone cream (KENALOG) 0.1 %    Wynonia Musty [facility] Short Stay Center/Anesthesiology Phone [phone] 04/06/2018 9:24 AM

## 2018-04-06 NOTE — Progress Notes (Signed)
I called Nikki at Dr Cleotilde Neer office and asked if patient was given any information regarding Leflunomide.  Lexine Baton reports that they only ask patient to stop anticoagulants.

## 2018-04-07 ENCOUNTER — Inpatient Hospital Stay (HOSPITAL_COMMUNITY): Payer: Medicare Other | Admitting: Physician Assistant

## 2018-04-07 ENCOUNTER — Encounter (HOSPITAL_COMMUNITY): Payer: Self-pay

## 2018-04-07 ENCOUNTER — Inpatient Hospital Stay (HOSPITAL_COMMUNITY)
Admission: RE | Disposition: A | Discharge status: Home / Self Care | Payer: Self-pay | Source: Home / Self Care | Attending: Neurosurgery

## 2018-04-07 ENCOUNTER — Inpatient Hospital Stay (HOSPITAL_COMMUNITY): Payer: Medicare Other

## 2018-04-07 ENCOUNTER — Inpatient Hospital Stay (HOSPITAL_COMMUNITY)
Admission: RE | Admit: 2018-04-07 | Discharge: 2018-04-08 | DRG: 460 | Disposition: A | Discharge status: Home / Self Care | Payer: Medicare Other | Attending: Neurosurgery | Admitting: Neurosurgery

## 2018-04-07 DIAGNOSIS — Z79899 Other long term (current) drug therapy: Secondary | ICD-10-CM | POA: Diagnosis not present

## 2018-04-07 DIAGNOSIS — Z882 Allergy status to sulfonamides status: Secondary | ICD-10-CM

## 2018-04-07 DIAGNOSIS — Z801 Family history of malignant neoplasm of trachea, bronchus and lung: Secondary | ICD-10-CM

## 2018-04-07 DIAGNOSIS — M48061 Spinal stenosis, lumbar region without neurogenic claudication: Principal | ICD-10-CM | POA: Diagnosis present

## 2018-04-07 DIAGNOSIS — M069 Rheumatoid arthritis, unspecified: Secondary | ICD-10-CM | POA: Diagnosis present

## 2018-04-07 DIAGNOSIS — E1143 Type 2 diabetes mellitus with diabetic autonomic (poly)neuropathy: Secondary | ICD-10-CM | POA: Diagnosis not present

## 2018-04-07 DIAGNOSIS — K219 Gastro-esophageal reflux disease without esophagitis: Secondary | ICD-10-CM | POA: Diagnosis present

## 2018-04-07 DIAGNOSIS — M5416 Radiculopathy, lumbar region: Secondary | ICD-10-CM | POA: Diagnosis not present

## 2018-04-07 DIAGNOSIS — Z9842 Cataract extraction status, left eye: Secondary | ICD-10-CM

## 2018-04-07 DIAGNOSIS — H9190 Unspecified hearing loss, unspecified ear: Secondary | ICD-10-CM | POA: Diagnosis present

## 2018-04-07 DIAGNOSIS — Z7951 Long term (current) use of inhaled steroids: Secondary | ICD-10-CM | POA: Diagnosis not present

## 2018-04-07 DIAGNOSIS — Z881 Allergy status to other antibiotic agents status: Secondary | ICD-10-CM | POA: Diagnosis not present

## 2018-04-07 DIAGNOSIS — E785 Hyperlipidemia, unspecified: Secondary | ICD-10-CM | POA: Diagnosis present

## 2018-04-07 DIAGNOSIS — Z86007 Personal history of in-situ neoplasm of skin: Secondary | ICD-10-CM

## 2018-04-07 DIAGNOSIS — Z981 Arthrodesis status: Secondary | ICD-10-CM | POA: Diagnosis not present

## 2018-04-07 DIAGNOSIS — F419 Anxiety disorder, unspecified: Secondary | ICD-10-CM | POA: Diagnosis present

## 2018-04-07 DIAGNOSIS — Z419 Encounter for procedure for purposes other than remedying health state, unspecified: Secondary | ICD-10-CM

## 2018-04-07 DIAGNOSIS — J45991 Cough variant asthma: Secondary | ICD-10-CM | POA: Diagnosis present

## 2018-04-07 DIAGNOSIS — G2581 Restless legs syndrome: Secondary | ICD-10-CM | POA: Diagnosis present

## 2018-04-07 DIAGNOSIS — I10 Essential (primary) hypertension: Secondary | ICD-10-CM | POA: Diagnosis not present

## 2018-04-07 DIAGNOSIS — M810 Age-related osteoporosis without current pathological fracture: Secondary | ICD-10-CM | POA: Diagnosis present

## 2018-04-07 DIAGNOSIS — M4316 Spondylolisthesis, lumbar region: Secondary | ICD-10-CM | POA: Diagnosis not present

## 2018-04-07 DIAGNOSIS — J45909 Unspecified asthma, uncomplicated: Secondary | ICD-10-CM | POA: Diagnosis not present

## 2018-04-07 DIAGNOSIS — F329 Major depressive disorder, single episode, unspecified: Secondary | ICD-10-CM | POA: Diagnosis not present

## 2018-04-07 DIAGNOSIS — Z825 Family history of asthma and other chronic lower respiratory diseases: Secondary | ICD-10-CM

## 2018-04-07 DIAGNOSIS — Z888 Allergy status to other drugs, medicaments and biological substances status: Secondary | ICD-10-CM | POA: Diagnosis not present

## 2018-04-07 DIAGNOSIS — K3184 Gastroparesis: Secondary | ICD-10-CM | POA: Diagnosis present

## 2018-04-07 DIAGNOSIS — Z7982 Long term (current) use of aspirin: Secondary | ICD-10-CM

## 2018-04-07 DIAGNOSIS — Z800 Family history of malignant neoplasm of digestive organs: Secondary | ICD-10-CM

## 2018-04-07 DIAGNOSIS — Z8261 Family history of arthritis: Secondary | ICD-10-CM

## 2018-04-07 DIAGNOSIS — Z7984 Long term (current) use of oral hypoglycemic drugs: Secondary | ICD-10-CM | POA: Diagnosis not present

## 2018-04-07 DIAGNOSIS — Z808 Family history of malignant neoplasm of other organs or systems: Secondary | ICD-10-CM

## 2018-04-07 DIAGNOSIS — Z9049 Acquired absence of other specified parts of digestive tract: Secondary | ICD-10-CM

## 2018-04-07 HISTORY — DX: Radiculopathy, lumbar region: M54.16

## 2018-04-07 HISTORY — DX: Major depressive disorder, single episode, unspecified: F32.9

## 2018-04-07 LAB — TYPE AND SCREEN
ABO/RH(D): A POS
Antibody Screen: NEGATIVE

## 2018-04-07 LAB — GLUCOSE, CAPILLARY
Glucose-Capillary: 143 mg/dL — ABNORMAL HIGH (ref 70–99)
Glucose-Capillary: 146 mg/dL — ABNORMAL HIGH (ref 70–99)
Glucose-Capillary: 189 mg/dL — ABNORMAL HIGH (ref 70–99)
Glucose-Capillary: 250 mg/dL — ABNORMAL HIGH (ref 70–99)

## 2018-04-07 LAB — CBC
HCT: 38.1 % (ref 36.0–46.0)
HEMOGLOBIN: 12.4 g/dL (ref 12.0–15.0)
MCH: 31.5 pg (ref 26.0–34.0)
MCHC: 32.5 g/dL (ref 30.0–36.0)
MCV: 96.7 fL (ref 80.0–100.0)
Platelets: 189 10*3/uL (ref 150–400)
RBC: 3.94 MIL/uL (ref 3.87–5.11)
RDW: 13.3 % (ref 11.5–15.5)
WBC: 8.2 10*3/uL (ref 4.0–10.5)

## 2018-04-07 LAB — BASIC METABOLIC PANEL
ANION GAP: 8 (ref 5–15)
BUN: 10 mg/dL (ref 8–23)
CHLORIDE: 108 mmol/L (ref 98–111)
CO2: 24 mmol/L (ref 22–32)
CREATININE: 0.52 mg/dL (ref 0.44–1.00)
Calcium: 8.9 mg/dL (ref 8.9–10.3)
GFR calc Af Amer: >60 mL/min (ref >60)
GFR calc non Af Amer: >60 mL/min (ref >60)
GLUCOSE: 139 mg/dL — AB (ref 70–99)
Potassium: 3.9 mmol/L (ref 3.5–5.1)
Sodium: 140 mmol/L (ref 135–145)

## 2018-04-07 LAB — ABO/RH: ABO/RH(D): A POS

## 2018-04-07 SURGERY — POSTERIOR LUMBAR FUSION 1 LEVEL
Anesthesia: General | Site: Spine Lumbar

## 2018-04-07 MED ORDER — GLYCOPYRROLATE PF 0.2 MG/ML IJ SOSY
PREFILLED_SYRINGE | INTRAMUSCULAR | Status: DC | PRN
  Administered 2018-04-07: .2 mg via INTRAVENOUS

## 2018-04-07 MED ORDER — CHLORHEXIDINE GLUCONATE CLOTH 2 % EX PADS: 6.0000 | MEDICATED_PAD | Freq: Once | CUTANEOUS | Status: DC

## 2018-04-07 MED ORDER — THROMBIN 5000 UNITS EX SOLR
CUTANEOUS | Status: AC
  Filled 2018-04-07: qty 5000

## 2018-04-07 MED ORDER — BISACODYL 10 MG RE SUPP: 10.0000 mg | Freq: Every day | RECTAL | Status: DC | PRN

## 2018-04-07 MED ORDER — SUGAMMADEX SODIUM 200 MG/2ML IV SOLN
INTRAVENOUS | Status: DC | PRN
  Administered 2018-04-07: 200 mg via INTRAVENOUS

## 2018-04-07 MED ORDER — ACETAMINOPHEN 650 MG RE SUPP: 650.0000 mg | RECTAL | Status: DC | PRN

## 2018-04-07 MED ORDER — LIDOCAINE 2% (20 MG/ML) 5 ML SYRINGE
INTRAMUSCULAR | Status: AC
  Filled 2018-04-07: qty 5

## 2018-04-07 MED ORDER — HEMOSTATIC AGENTS (NO CHARGE) OPTIME
TOPICAL | Status: DC | PRN
  Administered 2018-04-07: 1 via TOPICAL

## 2018-04-07 MED ORDER — MIDAZOLAM HCL 2 MG/2ML IJ SOLN
INTRAMUSCULAR | Status: AC
  Filled 2018-04-07: qty 2

## 2018-04-07 MED ORDER — FENTANYL CITRATE (PF) 100 MCG/2ML IJ SOLN
25.0000 ug | INTRAMUSCULAR | Status: DC | PRN
  Administered 2018-04-07 (×3): 25 ug via INTRAVENOUS

## 2018-04-07 MED ORDER — METFORMIN HCL 500 MG PO TABS
1000.0000 mg | ORAL_TABLET | Freq: Two times a day (BID) | ORAL | Status: DC
  Administered 2018-04-07 – 2018-04-08 (×2): 1000 mg via ORAL
  Filled 2018-04-07 (×2): qty 2

## 2018-04-07 MED ORDER — THROMBIN (RECOMBINANT) 20000 UNITS EX SOLR
CUTANEOUS | Status: AC
  Filled 2018-04-07: qty 20000

## 2018-04-07 MED ORDER — LOSARTAN POTASSIUM 50 MG PO TABS
25.0000 mg | ORAL_TABLET | Freq: Every day | ORAL | Status: DC
  Administered 2018-04-07: 25 mg via ORAL
  Filled 2018-04-07: qty 1

## 2018-04-07 MED ORDER — OXYCODONE HCL 5 MG PO TABS
ORAL_TABLET | ORAL | Status: AC
  Filled 2018-04-07: qty 2

## 2018-04-07 MED ORDER — SODIUM CHLORIDE 0.9 % IV SOLN
INTRAVENOUS | Status: DC | PRN
  Administered 2018-04-07: 30 ug/min via INTRAVENOUS

## 2018-04-07 MED ORDER — PHENYLEPHRINE 40 MCG/ML (10ML) SYRINGE FOR IV PUSH (FOR BLOOD PRESSURE SUPPORT)
PREFILLED_SYRINGE | INTRAVENOUS | Status: DC | PRN
  Administered 2018-04-07 (×4): 80 ug via INTRAVENOUS

## 2018-04-07 MED ORDER — GLYCOPYRROLATE PF 0.2 MG/ML IJ SOSY
PREFILLED_SYRINGE | INTRAMUSCULAR | Status: AC
  Filled 2018-04-07: qty 1

## 2018-04-07 MED ORDER — LIDOCAINE 2% (20 MG/ML) 5 ML SYRINGE
INTRAMUSCULAR | Status: DC | PRN
  Administered 2018-04-07: 70 mg via INTRAVENOUS

## 2018-04-07 MED ORDER — LIDOCAINE-EPINEPHRINE 1 %-1:100000 IJ SOLN
INTRAMUSCULAR | Status: DC | PRN
  Administered 2018-04-07: 5 mL

## 2018-04-07 MED ORDER — MENTHOL 3 MG MT LOZG: 1.0000 | LOZENGE | OROMUCOSAL | Status: DC | PRN

## 2018-04-07 MED ORDER — MIDAZOLAM HCL 5 MG/5ML IJ SOLN
INTRAMUSCULAR | Status: DC | PRN
  Administered 2018-04-07 (×2): 1 mg via INTRAVENOUS

## 2018-04-07 MED ORDER — SODIUM CHLORIDE 0.9 % IV SOLN
INTRAVENOUS | Status: DC | PRN
  Administered 2018-04-07: 500 mL

## 2018-04-07 MED ORDER — ESMOLOL HCL 100 MG/10ML IV SOLN
INTRAVENOUS | Status: DC | PRN
  Administered 2018-04-07: 20 mg via INTRAVENOUS
  Administered 2018-04-07: 30 mg via INTRAVENOUS
  Administered 2018-04-07: 20 mg via INTRAVENOUS

## 2018-04-07 MED ORDER — THROMBIN 20000 UNITS EX SOLR
CUTANEOUS | Status: DC | PRN
  Administered 2018-04-07: 20 mL via TOPICAL

## 2018-04-07 MED ORDER — DOCUSATE SODIUM 100 MG PO CAPS
100.0000 mg | ORAL_CAPSULE | Freq: Two times a day (BID) | ORAL | Status: DC
  Administered 2018-04-07 (×2): 100 mg via ORAL
  Filled 2018-04-07 (×2): qty 1

## 2018-04-07 MED ORDER — SODIUM CHLORIDE 0.9 % IV SOLN
INTRAVENOUS | Status: DC
  Administered 2018-04-07: 13:00:00 via INTRAVENOUS

## 2018-04-07 MED ORDER — FENTANYL CITRATE (PF) 250 MCG/5ML IJ SOLN
INTRAMUSCULAR | Status: AC
  Filled 2018-04-07: qty 5

## 2018-04-07 MED ORDER — GABAPENTIN 300 MG PO CAPS
300.0000 mg | ORAL_CAPSULE | Freq: Three times a day (TID) | ORAL | Status: DC
  Administered 2018-04-07 (×2): 300 mg via ORAL
  Filled 2018-04-07 (×2): qty 1

## 2018-04-07 MED ORDER — SENNA 8.6 MG PO TABS
1.0000 | ORAL_TABLET | Freq: Two times a day (BID) | ORAL | Status: DC
  Administered 2018-04-07 (×2): 8.6 mg via ORAL
  Filled 2018-04-07 (×2): qty 1

## 2018-04-07 MED ORDER — SENNOSIDES-DOCUSATE SODIUM 8.6-50 MG PO TABS: 1.0000 | ORAL_TABLET | Freq: Every evening | ORAL | Status: DC | PRN

## 2018-04-07 MED ORDER — SODIUM CHLORIDE 0.9 % IV SOLN: 250.0000 mL | INTRAVENOUS | Status: DC

## 2018-04-07 MED ORDER — ROCURONIUM BROMIDE 10 MG/ML (PF) SYRINGE
PREFILLED_SYRINGE | INTRAVENOUS | Status: DC | PRN
  Administered 2018-04-07: 10 mg via INTRAVENOUS
  Administered 2018-04-07: 50 mg via INTRAVENOUS

## 2018-04-07 MED ORDER — CEFAZOLIN SODIUM-DEXTROSE 2-4 GM/100ML-% IV SOLN
2.0000 g | Freq: Three times a day (TID) | INTRAVENOUS | Status: AC
  Administered 2018-04-07 (×2): 2 g via INTRAVENOUS
  Filled 2018-04-07 (×2): qty 100

## 2018-04-07 MED ORDER — KETAMINE HCL 50 MG/5ML IJ SOSY
PREFILLED_SYRINGE | INTRAMUSCULAR | Status: AC
  Filled 2018-04-07: qty 5

## 2018-04-07 MED ORDER — PROPOFOL 10 MG/ML IV BOLUS
INTRAVENOUS | Status: AC
  Filled 2018-04-07: qty 20

## 2018-04-07 MED ORDER — LACTATED RINGERS IV SOLN
INTRAVENOUS | Status: DC
  Administered 2018-04-07: 07:00:00 via INTRAVENOUS

## 2018-04-07 MED ORDER — PHENOL 1.4 % MT LIQD: 1.0000 | OROMUCOSAL | Status: DC | PRN

## 2018-04-07 MED ORDER — BUPIVACAINE HCL (PF) 0.5 % IJ SOLN
INTRAMUSCULAR | Status: DC | PRN
  Administered 2018-04-07: 5 mL

## 2018-04-07 MED ORDER — FLEET ENEMA 7-19 GM/118ML RE ENEM: 1.0000 | ENEMA | Freq: Once | RECTAL | Status: DC | PRN

## 2018-04-07 MED ORDER — LACTATED RINGERS IV SOLN
INTRAVENOUS | Status: DC | PRN
  Administered 2018-04-07 (×2): via INTRAVENOUS

## 2018-04-07 MED ORDER — BUPIVACAINE HCL (PF) 0.5 % IJ SOLN
INTRAMUSCULAR | Status: AC
  Filled 2018-04-07: qty 30

## 2018-04-07 MED ORDER — METHOCARBAMOL 1000 MG/10ML IJ SOLN
500.0000 mg | Freq: Four times a day (QID) | INTRAVENOUS | Status: DC | PRN
  Filled 2018-04-07: qty 5

## 2018-04-07 MED ORDER — SODIUM CHLORIDE 0.9% FLUSH: 3.0000 mL | INTRAVENOUS | Status: DC | PRN

## 2018-04-07 MED ORDER — FENTANYL CITRATE (PF) 100 MCG/2ML IJ SOLN
INTRAMUSCULAR | Status: AC
  Filled 2018-04-07: qty 2

## 2018-04-07 MED ORDER — INSULIN ASPART 100 UNIT/ML ~~LOC~~ SOLN
0.0000 [IU] | Freq: Every day | SUBCUTANEOUS | Status: DC
  Administered 2018-04-07: 2 [IU] via SUBCUTANEOUS

## 2018-04-07 MED ORDER — ACETAMINOPHEN 325 MG PO TABS
650.0000 mg | ORAL_TABLET | ORAL | Status: DC | PRN
  Administered 2018-04-07: 650 mg via ORAL
  Filled 2018-04-07: qty 2

## 2018-04-07 MED ORDER — THROMBIN 5000 UNITS EX SOLR
OROMUCOSAL | Status: DC | PRN
  Administered 2018-04-07: 5 mL via TOPICAL

## 2018-04-07 MED ORDER — ACETAMINOPHEN 10 MG/ML IV SOLN
INTRAVENOUS | Status: DC | PRN
  Administered 2018-04-07: 1000 mg via INTRAVENOUS

## 2018-04-07 MED ORDER — PANTOPRAZOLE SODIUM 40 MG PO TBEC
40.0000 mg | DELAYED_RELEASE_TABLET | Freq: Every day | ORAL | Status: DC
  Administered 2018-04-07: 40 mg via ORAL
  Filled 2018-04-07: qty 1

## 2018-04-07 MED ORDER — KETAMINE HCL 10 MG/ML IJ SOLN
INTRAMUSCULAR | Status: DC | PRN
  Administered 2018-04-07: 20 mg via INTRAVENOUS
  Administered 2018-04-07: 10 mg via INTRAVENOUS

## 2018-04-07 MED ORDER — ACETAMINOPHEN 10 MG/ML IV SOLN
INTRAVENOUS | Status: AC
  Filled 2018-04-07: qty 100

## 2018-04-07 MED ORDER — HYDROMORPHONE HCL 1 MG/ML IJ SOLN: 0.5000 mg | INTRAMUSCULAR | Status: DC | PRN

## 2018-04-07 MED ORDER — METHOCARBAMOL 500 MG PO TABS
500.0000 mg | ORAL_TABLET | Freq: Four times a day (QID) | ORAL | Status: DC | PRN
  Administered 2018-04-07 – 2018-04-08 (×3): 500 mg via ORAL
  Filled 2018-04-07 (×2): qty 1

## 2018-04-07 MED ORDER — 0.9 % SODIUM CHLORIDE (POUR BTL) OPTIME
TOPICAL | Status: DC | PRN
  Administered 2018-04-07: 1000 mL

## 2018-04-07 MED ORDER — PROPOFOL 10 MG/ML IV BOLUS
INTRAVENOUS | Status: DC | PRN
  Administered 2018-04-07: 20 mg via INTRAVENOUS
  Administered 2018-04-07: 100 mg via INTRAVENOUS
  Administered 2018-04-07: 50 mg via INTRAVENOUS

## 2018-04-07 MED ORDER — METHOCARBAMOL 500 MG PO TABS
ORAL_TABLET | ORAL | Status: AC
  Filled 2018-04-07: qty 1

## 2018-04-07 MED ORDER — ACETAMINOPHEN 500 MG PO TABS
1000.0000 mg | ORAL_TABLET | Freq: Four times a day (QID) | ORAL | Status: DC
  Administered 2018-04-07 (×2): 1000 mg via ORAL
  Filled 2018-04-07 (×3): qty 2

## 2018-04-07 MED ORDER — OXYCODONE HCL 5 MG PO TABS
5.0000 mg | ORAL_TABLET | ORAL | Status: DC | PRN
  Administered 2018-04-07 – 2018-04-08 (×6): 10 mg via ORAL
  Administered 2018-04-08: 5 mg via ORAL
  Filled 2018-04-07 (×6): qty 2

## 2018-04-07 MED ORDER — INSULIN ASPART 100 UNIT/ML ~~LOC~~ SOLN
0.0000 [IU] | Freq: Three times a day (TID) | SUBCUTANEOUS | Status: DC
  Administered 2018-04-08: 3 [IU] via SUBCUTANEOUS

## 2018-04-07 MED ORDER — DEXAMETHASONE SODIUM PHOSPHATE 10 MG/ML IJ SOLN
INTRAMUSCULAR | Status: AC
  Filled 2018-04-07: qty 1

## 2018-04-07 MED ORDER — LIDOCAINE-EPINEPHRINE 1 %-1:100000 IJ SOLN
INTRAMUSCULAR | Status: AC
  Filled 2018-04-07: qty 1

## 2018-04-07 MED ORDER — SERTRALINE HCL 50 MG PO TABS
50.0000 mg | ORAL_TABLET | Freq: Every day | ORAL | Status: DC
  Administered 2018-04-07: 50 mg via ORAL
  Filled 2018-04-07: qty 1

## 2018-04-07 MED ORDER — CEFAZOLIN SODIUM-DEXTROSE 2-4 GM/100ML-% IV SOLN
2.0000 g | INTRAVENOUS | Status: AC
  Administered 2018-04-07: 2 g via INTRAVENOUS
  Filled 2018-04-07: qty 100

## 2018-04-07 MED ORDER — HYDROCODONE-ACETAMINOPHEN 5-325 MG PO TABS: 1.0000 | ORAL_TABLET | ORAL | Status: DC | PRN

## 2018-04-07 MED ORDER — SODIUM CHLORIDE 0.9% FLUSH: 3.0000 mL | Freq: Two times a day (BID) | INTRAVENOUS | Status: DC

## 2018-04-07 MED ORDER — ROCURONIUM BROMIDE 50 MG/5ML IV SOSY
PREFILLED_SYRINGE | INTRAVENOUS | Status: AC
  Filled 2018-04-07: qty 5

## 2018-04-07 MED ORDER — FENTANYL CITRATE (PF) 100 MCG/2ML IJ SOLN
INTRAMUSCULAR | Status: DC | PRN
  Administered 2018-04-07: 75 ug via INTRAVENOUS
  Administered 2018-04-07: 25 ug via INTRAVENOUS

## 2018-04-07 MED ORDER — DEXAMETHASONE SODIUM PHOSPHATE 10 MG/ML IJ SOLN
INTRAMUSCULAR | Status: DC | PRN
  Administered 2018-04-07: 4 mg via INTRAVENOUS

## 2018-04-07 MED ORDER — MONTELUKAST SODIUM 10 MG PO TABS
10.0000 mg | ORAL_TABLET | Freq: Every day | ORAL | Status: DC
  Filled 2018-04-07 (×2): qty 1

## 2018-04-07 SURGICAL SUPPLY — 81 items
ADH SKN CLS APL DERMABOND .7 (GAUZE/BANDAGES/DRESSINGS) ×1
APL SKNCLS STERI-STRIP NONHPOA (GAUZE/BANDAGES/DRESSINGS)
BAG DECANTER FOR FLEXI CONT (MISCELLANEOUS) ×3 IMPLANT
BASKET BONE COLLECTION (BASKET) ×3 IMPLANT
BENZOIN TINCTURE PRP APPL 2/3 (GAUZE/BANDAGES/DRESSINGS) IMPLANT
BIT DRILL 3.5 POWEREASE (BIT) ×1 IMPLANT
BIT DRILL 3.5MM POWEREASE (BIT) ×1
BLADE CLIPPER SURG (BLADE) IMPLANT
BLADE SURG 11 STRL SS (BLADE) ×3 IMPLANT
BUR MATCHSTICK NEURO 3.0 LAGG (BURR) ×3 IMPLANT
BUR PRECISION FLUTE 5.0 (BURR) ×3 IMPLANT
CANISTER SUCT 3000ML PPV (MISCELLANEOUS) ×3 IMPLANT
CARTRIDGE OIL MAESTRO DRILL (MISCELLANEOUS) ×1 IMPLANT
CLOSURE WOUND 1/2 X4 (GAUZE/BANDAGES/DRESSINGS)
CONT SPEC 4OZ CLIKSEAL STRL BL (MISCELLANEOUS) ×3 IMPLANT
COVER BACK TABLE 60X90IN (DRAPES) ×3 IMPLANT
COVER WAND RF STERILE (DRAPES) ×3 IMPLANT
DECANTER SPIKE VIAL GLASS SM (MISCELLANEOUS) ×3 IMPLANT
DERMABOND ADVANCED (GAUZE/BANDAGES/DRESSINGS) ×2
DERMABOND ADVANCED .7 DNX12 (GAUZE/BANDAGES/DRESSINGS) ×1 IMPLANT
DEVICE INTERBODY ELEVATE 23X8 (Cage) ×6 IMPLANT
DIFFUSER DRILL AIR PNEUMATIC (MISCELLANEOUS) ×3 IMPLANT
DRAPE C-ARM 42X72 X-RAY (DRAPES) ×3 IMPLANT
DRAPE C-ARMOR (DRAPES) ×3 IMPLANT
DRAPE LAPAROTOMY 100X72X124 (DRAPES) ×3 IMPLANT
DRAPE SURG 17X23 STRL (DRAPES) ×3 IMPLANT
DRSG OPSITE POSTOP 4X6 (GAUZE/BANDAGES/DRESSINGS) ×2 IMPLANT
DURAPREP 26ML APPLICATOR (WOUND CARE) ×3 IMPLANT
ELECT REM PT RETURN 9FT ADLT (ELECTROSURGICAL) ×3
ELECTRODE REM PT RTRN 9FT ADLT (ELECTROSURGICAL) ×1 IMPLANT
GAUZE 4X4 16PLY RFD (DISPOSABLE) IMPLANT
GAUZE SPONGE 4X4 12PLY STRL (GAUZE/BANDAGES/DRESSINGS) IMPLANT
GLOVE BIO SURGEON STRL SZ7.5 (GLOVE) ×4 IMPLANT
GLOVE BIOGEL PI IND STRL 7.5 (GLOVE) ×2 IMPLANT
GLOVE BIOGEL PI INDICATOR 7.5 (GLOVE) ×12
GLOVE ECLIPSE 7.0 STRL STRAW (GLOVE) ×6 IMPLANT
GLOVE EXAM NITRILE LRG STRL (GLOVE) IMPLANT
GLOVE EXAM NITRILE XL STR (GLOVE) IMPLANT
GLOVE EXAM NITRILE XS STR PU (GLOVE) IMPLANT
GLOVE SURG SS PI 7.5 STRL IVOR (GLOVE) ×6 IMPLANT
GOWN STRL REUS W/ TWL LRG LVL3 (GOWN DISPOSABLE) ×4 IMPLANT
GOWN STRL REUS W/ TWL XL LVL3 (GOWN DISPOSABLE) IMPLANT
GOWN STRL REUS W/TWL 2XL LVL3 (GOWN DISPOSABLE) IMPLANT
GOWN STRL REUS W/TWL LRG LVL3 (GOWN DISPOSABLE) ×18
GOWN STRL REUS W/TWL XL LVL3 (GOWN DISPOSABLE)
GRAFT DURAGEN MATRIX 1WX1L (Tissue) ×2 IMPLANT
HEMOSTAT POWDER KIT SURGIFOAM (HEMOSTASIS) ×3 IMPLANT
KIT BASIN OR (CUSTOM PROCEDURE TRAY) ×3 IMPLANT
KIT INFUSE XX SMALL 0.7CC (Orthopedic Implant) ×2 IMPLANT
KIT POSITION SURG JACKSON T1 (MISCELLANEOUS) ×3 IMPLANT
KIT TURNOVER KIT B (KITS) ×3 IMPLANT
MILL MEDIUM DISP (BLADE) ×3 IMPLANT
NDL HYPO 18GX1.5 BLUNT FILL (NEEDLE) IMPLANT
NDL SPNL 18GX3.5 QUINCKE PK (NEEDLE) IMPLANT
NEEDLE HYPO 18GX1.5 BLUNT FILL (NEEDLE) IMPLANT
NEEDLE HYPO 22GX1.5 SAFETY (NEEDLE) ×3 IMPLANT
NEEDLE SPNL 18GX3.5 QUINCKE PK (NEEDLE) ×3 IMPLANT
NS IRRIG 1000ML POUR BTL (IV SOLUTION) ×3 IMPLANT
OIL CARTRIDGE MAESTRO DRILL (MISCELLANEOUS) ×3
PACK LAMINECTOMY NEURO (CUSTOM PROCEDURE TRAY) ×3 IMPLANT
PAD ARMBOARD 7.5X6 YLW CONV (MISCELLANEOUS) ×9 IMPLANT
PASTE BONE GRAFTON 5CC (Bone Implant) ×2 IMPLANT
ROD COBALT 47.5X35 (Rod) ×4 IMPLANT
SCREW 5.5X35MM (Screw) ×12 IMPLANT
SCREW BN 35X5.5XMA NS SPNE (Screw) IMPLANT
SCREW SET SOLERA (Screw) ×12 IMPLANT
SCREW SET SOLERA TI (Screw) IMPLANT
SEALANT ADHERUS EXTEND TIP (MISCELLANEOUS) ×2 IMPLANT
SPACER SPNL XLORDOTIC 23X8X (Cage) IMPLANT
SPCR SPNL XLORDOTIC 23X8X (Cage) ×2 IMPLANT
SPONGE LAP 4X18 RFD (DISPOSABLE) IMPLANT
SPONGE SURGIFOAM ABS GEL 100 (HEMOSTASIS) IMPLANT
STRIP CLOSURE SKIN 1/2X4 (GAUZE/BANDAGES/DRESSINGS) IMPLANT
SUT VIC AB 0 CT1 18XCR BRD8 (SUTURE) ×1 IMPLANT
SUT VIC AB 0 CT1 8-18 (SUTURE) ×3
SUT VICRYL 3-0 RB1 18 ABS (SUTURE) ×3 IMPLANT
SYR 3ML LL SCALE MARK (SYRINGE) ×4 IMPLANT
TOWEL GREEN STERILE (TOWEL DISPOSABLE) ×3 IMPLANT
TOWEL GREEN STERILE FF (TOWEL DISPOSABLE) ×3 IMPLANT
TRAY FOLEY MTR SLVR 16FR STAT (SET/KITS/TRAYS/PACK) ×3 IMPLANT
WATER STERILE IRR 1000ML POUR (IV SOLUTION) ×3 IMPLANT

## 2018-04-07 NOTE — Anesthesia Procedure Notes (Signed)
Procedure Name: Intubation Performed by: Milford Cage, CRNA Pre-anesthesia Checklist: Patient identified, Emergency Drugs available, Suction available and Patient being monitored Patient Re-evaluated:Patient Re-evaluated prior to induction Oxygen Delivery Method: Circle System Utilized Preoxygenation: Pre-oxygenation with 100% oxygen Induction Type: IV induction Ventilation: Mask ventilation without difficulty Laryngoscope Size: Glidescope and 4 Grade View: Grade I Tube type: Oral Tube size: 7.0 mm Number of attempts: 1 Airway Equipment and Method: Stylet and Video-laryngoscopy Placement Confirmation: ETT inserted through vocal cords under direct vision,  positive ETCO2 and breath sounds checked- equal and bilateral Secured at: 22 cm Tube secured with: Tape Dental Injury: Teeth and Oropharynx as per pre-operative assessment

## 2018-04-07 NOTE — Anesthesia Preprocedure Evaluation (Addendum)
Anesthesia Evaluation  Patient identified by MRN, date of birth, ID band Patient awake    Reviewed: Allergy & Precautions, NPO status , Patient's Chart, lab work & pertinent test results  Airway Mallampati: III  TM Distance: >3 FB Neck ROM: Full    Dental  (+) Chipped, Poor Dentition,    Pulmonary asthma , former smoker,  Dara Hoyer, MD is Pulmonologist   Pulmonary exam normal breath sounds clear to auscultation       Cardiovascular hypertension, Pt. on medications Normal cardiovascular exam Rhythm:Regular Rate:Normal  ECG: NSR, rate 97  Discharged from cardiology Einar Gip)   Neuro/Psych PSYCHIATRIC DISORDERS Anxiety Depression negative neurological ROS     GI/Hepatic Neg liver ROS, GERD  Medicated and Controlled,  Endo/Other  diabetes, Oral Hypoglycemic Agents  Renal/GU negative Renal ROS     Musculoskeletal  (+) Arthritis , Rheumatoid disorders,    Abdominal   Peds  Hematology HLD   Anesthesia Other Findings SPONDYLOLISTHESIS, LUMBAR REGION  Reproductive/Obstetrics                           Anesthesia Physical Anesthesia Plan  ASA: III  Anesthesia Plan: General   Post-op Pain Management:    Induction: Intravenous  PONV Risk Score and Plan: 3 and Midazolam, Dexamethasone, Ondansetron and Treatment may vary due to age or medical condition  Airway Management Planned: Oral ETT  Additional Equipment:   Intra-op Plan:   Post-operative Plan: Extubation in OR  Informed Consent: I have reviewed the patients History and Physical, chart, labs and discussed the procedure including the risks, benefits and alternatives for the proposed anesthesia with the patient or authorized representative who has indicated his/her understanding and acceptance.   Dental advisory given  Plan Discussed with: CRNA  Anesthesia Plan Comments:         Anesthesia Quick Evaluation

## 2018-04-07 NOTE — Anesthesia Postprocedure Evaluation (Signed)
Anesthesia Post Note  Patient: Michelle [name]  Procedure(s) Performed: POSTERIOR LUMBAR INTERBODY FUSION LUMBAR FOUR- LUMBAR FIVE, INTERBODY DEVICE, INTERBODY ARTHRODESIS, POSTERIOR NON-SGMENTAL INSTRUMENTATION (N/A Spine Lumbar)     Patient location during evaluation: PACU Anesthesia Type: General Level of consciousness: awake and alert Pain management: pain level controlled Vital Signs Assessment: post-procedure vital signs reviewed and stable Respiratory status: spontaneous breathing, nonlabored ventilation, respiratory function stable and patient connected to nasal cannula oxygen Cardiovascular status: blood pressure returned to baseline and stable Postop Assessment: no apparent nausea or vomiting Anesthetic complications: no    Last Vitals:  Vitals:   04/07/18 1326 04/07/18 1335  BP: 125/66 139/78  Pulse:  78  Resp: 18 20  Temp:  36.8 C  SpO2: 92% 96%    Last Pain:  Vitals:   04/07/18 1340  TempSrc:   PainSc: 5                  Garland Hincapie P Corey Laski

## 2018-04-07 NOTE — Transfer of Care (Signed)
Immediate Anesthesia Transfer of Care Note  Patient: Michelle [name]  Procedure(s) Performed: POSTERIOR LUMBAR INTERBODY FUSION LUMBAR FOUR- LUMBAR FIVE, INTERBODY DEVICE, INTERBODY ARTHRODESIS, POSTERIOR NON-SGMENTAL INSTRUMENTATION (N/A Spine Lumbar)  Patient Location: PACU  Anesthesia Type:General  Level of Consciousness: awake, alert  and oriented  Airway & Oxygen Therapy: Patient Spontanous Breathing and Patient connected to face mask oxygen  Post-op Assessment: Report given to RN and Post -op Vital signs reviewed and stable  Post vital signs: Reviewed and stable  Last Vitals:  Vitals Value Taken Time  BP 129/54 04/07/2018 12:25 PM  Temp    Pulse 90 04/07/2018 12:27 PM  Resp 15 04/07/2018 12:27 PM  SpO2 93 % 04/07/2018 12:27 PM  Vitals shown include unvalidated device data.  Last Pain:  Vitals:   04/07/18 0715  TempSrc:   PainSc: 8       Patients Stated Pain Goal: 3 (02/40/97 3532)  Complications: No apparent anesthesia complications

## 2018-04-08 LAB — PROTIME-INR
INR: 1.06
Prothrombin Time: 13.7 s (ref 11.4–15.2)

## 2018-04-08 LAB — CBC
HCT: 33.1 % — ABNORMAL LOW (ref 36.0–46.0)
Hemoglobin: 10.4 g/dL — ABNORMAL LOW (ref 12.0–15.0)
MCH: 30.8 pg (ref 26.0–34.0)
MCHC: 31.4 g/dL (ref 30.0–36.0)
MCV: 97.9 fL (ref 80.0–100.0)
Platelets: 184 10*3/uL (ref 150–400)
RBC: 3.38 MIL/uL — ABNORMAL LOW (ref 3.87–5.11)
RDW: 13.4 % (ref 11.5–15.5)
WBC: 11.6 10*3/uL — ABNORMAL HIGH (ref 4.0–10.5)
nRBC: 0 % (ref 0.0–0.2)

## 2018-04-08 LAB — BASIC METABOLIC PANEL
Anion gap: 6 (ref 5–15)
BUN: 8 mg/dL (ref 8–23)
CO2: 25 mmol/L (ref 22–32)
Calcium: 8.7 mg/dL — ABNORMAL LOW (ref 8.9–10.3)
Chloride: 103 mmol/L (ref 98–111)
Creatinine, Ser: 0.57 mg/dL (ref 0.44–1.00)
GFR calc Af Amer: >60 mL/min (ref >60)
GFR calc non Af Amer: >60 mL/min (ref >60)
Glucose, Bld: 202 mg/dL — ABNORMAL HIGH (ref 70–99)
Potassium: 4 mmol/L (ref 3.5–5.1)
Sodium: 134 mmol/L — ABNORMAL LOW (ref 135–145)

## 2018-04-08 LAB — GLUCOSE, CAPILLARY: Glucose-Capillary: 182 mg/dL — ABNORMAL HIGH (ref 70–99)

## 2018-04-08 LAB — APTT: aPTT: 26 seconds (ref 24–36)

## 2018-04-08 MED ORDER — METHOCARBAMOL 750 MG PO TABS: 750.0000 mg | ORAL_TABLET | Freq: Three times a day (TID) | ORAL | 1 refills | Status: DC | PRN

## 2018-04-08 MED ORDER — ASPIRIN EC 81 MG PO TBEC: 81.0000 mg | DELAYED_RELEASE_TABLET | Freq: Every day | ORAL | Status: DC

## 2018-04-08 MED FILL — Thrombin (Recombinant) For Soln 20000 Unit: CUTANEOUS | Qty: 1 | Status: AC

## 2018-04-08 NOTE — Progress Notes (Signed)
  NEUROSURGERY PROGRESS NOTE   No issues overnight.  Pre op pain resolved Complains of appropriate back soreness Tolerating po Voiding normal Eager for d/c  EXAM:  BP 93/62 (BP Location: Right Arm)   Pulse 73   Temp 98.5 F (36.9 C) (Oral)   Resp 18   Ht 5\' 3"  (1.6 m)   Wt 66.2 kg   SpO2 99%   BMI 25.86 kg/m   Awake, alert, oriented  Speech fluent, appropriate  CN grossly intact  5/5 BUE/BLE  Incision c/d/i  PLAN Doing well this am Pre op pain resolved On pain management contract. Will reach out to them for rx Cleared for d/c

## 2018-04-08 NOTE — Evaluation (Signed)
Physical Therapy Evaluation/Discharge  Patient Details Name: Michelle [name] MRN: [id number] DOB: 02-11-1951 Today's Date: 04/08/2018   History of Present Illness  Pt is a 67 y/o female s/p L4-5 PLIF.  PMH: anxiety and depression, skin CA, DM, HTN, HLD, GERD, RA, osteoporosis, asthma and urge incontinence.   Clinical Impression  Pt pleasant on arrival, had just finished OT session. Pt able to demonstrate transfers mod I and ambulation and stair navigation with supervision and use of RW. Pt reports this functioning is baseline. All back precautions reviewed with patient and family including techniques for car transfers at d/c. All education completed with no further acute therapy needs anticipated at this time.     Follow Up Recommendations No PT follow up    Equipment Recommendations  None recommended by PT    Recommendations for Other Services       Precautions / Restrictions Precautions Precautions: Back Precaution Booklet Issued: Yes (comment) Precaution Comments: reviewed precautions with pt, able to recall all at end of session.  Required Braces or Orthoses: Spinal Brace Spinal Brace: Lumbar corset(already on at arrival ) Restrictions Weight Bearing Restrictions: No      Mobility  Bed Mobility               General bed mobility comments: seated EOB upon etry  Transfers Overall transfer level: Modified independent Equipment used: Rolling walker (2 wheeled) Transfers: Sit to/from Stand Sit to Stand: Modified independent (Device/Increase time)         General transfer comment: excellent carryover technique from OT session. no cues required.   Ambulation/Gait Ambulation/Gait assistance: Modified independent (Device/Increase time) Gait Distance (Feet): 400 Feet Assistive device: Rolling walker (2 wheeled) Gait Pattern/deviations: WFL(Within Functional Limits);Step-through pattern;Decreased stride length Gait velocity: slowed  Gait velocity interpretation:  1.31 - 2.62 ft/sec, indicative of limited community ambulator General Gait Details: overall steady with use of RW.   Stairs Stairs: Yes Stairs assistance: Supervision Stair Management: One rail Left Number of Stairs: 3 General stair comments: overall steady, good foot clearance.   Wheelchair Mobility    Modified Rankin (Stroke Patients Only)       Balance Overall balance assessment: Mild deficits observed, not formally tested;Modified Independent                                           Pertinent Vitals/Pain Pain Assessment: 0-10 Pain Score: 5  Faces Pain Scale: Hurts little more Pain Location: back Pain Descriptors / Indicators: Operative site guarding;Discomfort;Aching Pain Intervention(s): Limited activity within patient's tolerance;Monitored during session;Repositioned    Home Living Family/patient expects to be discharged to:: Private residence Living Arrangements: Spouse/significant other;Other (Comment) Available Help at Discharge: Family;Available 24 hours/day Type of Home: House Home Access: Stairs to enter Entrance Stairs-Rails: Left Entrance Stairs-Number of Steps: 3 Home Layout: One level Home Equipment: Walker - 4 wheels Additional Comments: uses RW at baseline    Prior Function Level of Independence: Independent with assistive device(s)         Comments: reports independent with ADLs, used rollator at times for mobility due to pain, limited IADLs     Hand Dominance   Dominant Hand: Right    Extremity/Trunk Assessment   Upper Extremity Assessment Upper Extremity Assessment: Defer to OT evaluation    Lower Extremity Assessment Lower Extremity Assessment: Overall WFL for tasks assessed    Cervical / Trunk Assessment Cervical / Trunk  Assessment: (lumbar post-op incision ) Cervical / Trunk Exceptions: s/p back surgery  Communication   Communication: No difficulties  Cognition Arousal/Alertness: Awake/alert Behavior  During Therapy: WFL for tasks assessed/performed Overall Cognitive Status: Within Functional Limits for tasks assessed                                        General Comments      Exercises     Assessment/Plan    PT Assessment Patent does not need any further PT services  PT Problem List         PT Treatment Interventions      PT Goals (Current goals can be found in the Care Plan section)  Acute Rehab PT Goals Patient Stated Goal: return home  PT Goal Formulation: With patient/family Time For Goal Achievement: 04/22/18 Potential to Achieve Goals: Good    Frequency     Barriers to discharge        Co-evaluation               AM-PAC PT "6 Clicks" Daily Activity  Outcome Measure Difficulty turning over in bed (including adjusting bedclothes, sheets and blankets)?: A Little Difficulty moving from lying on back to sitting on the side of the bed? : A Little Difficulty sitting down on and standing up from a chair with arms (e.g., wheelchair, bedside commode, etc,.)?: A Little Help needed moving to and from a bed to chair (including a wheelchair)?: None Help needed walking in hospital room?: None Help needed climbing 3-5 steps with a railing? : A Little 6 Click Score: 20    End of Session Equipment Utilized During Treatment: Gait belt Activity Tolerance: Patient tolerated treatment well;No increased pain Patient left: with call bell/phone within reach;with family/visitor present;Other (comment)(pt left sitting EOB ) Nurse Communication: Mobility status PT Visit Diagnosis: Unsteadiness on feet (R26.81);Other abnormalities of gait and mobility (R26.89)    Time: 3846-6599 PT Time Calculation (min) (ACUTE ONLY): 19 min   Charges:   PT Evaluation $PT Eval Low Complexity: [place], [place]  Acute Rehab [phone]   Samuella Bruin 04/08/2018, 10:28 AM

## 2018-04-08 NOTE — Evaluation (Signed)
Occupational Therapy Evaluation Patient Details Name: Michelle [name] MRN: [id number] DOB: 02/09/1951 Today's Date: 04/08/2018    History of Present Illness Pt is a 67 y/o female s/p L4-5 PLIF.  PMH: anexiety, skin CA, DM, HTN, RA, osteoporosis.    Clinical Impression   PTA patient independent with ADLs, limited IADLs and used rollator at times due to pain.  Admitted for above and limited by decreased activity tolerance, pain, and precautions. Patient demonstrates ability to complete UB ADLs with setup, LB ADLs with supervision (figure 4 technique), toileting/toilet transfers using RW with supervision, and simulated tub transfers with min guard for safety.  Educated on precautions, brace management and wear schedule, ADL compensatory techniques, safety, mobility and recommendations.  Pt will have 24/7 support at discharge, and has no further questions or concerns.  At this time, no further OT needs identified.  Thank you for this referral!  OT signing off.     Follow Up Recommendations  No OT follow up;Supervision - Intermittent    Equipment Recommendations  3 in 1 bedside commode    Recommendations for Other Services       Precautions / Restrictions Precautions Precautions: Back Precaution Booklet Issued: Yes (comment) Precaution Comments: reviewed back precautions thorougly with patient Required Braces or Orthoses: Spinal Brace Spinal Brace: Applied in sitting position Restrictions Weight Bearing Restrictions: No      Mobility Bed Mobility               General bed mobility comments: seated EOB upon etry  Transfers Overall transfer level: Needs assistance Equipment used: Rolling walker (2 wheeled) Transfers: Sit to/from Stand Sit to Stand: Supervision         General transfer comment: supervision for safety. cueing for hand placement and technique for back precautions     Balance Overall balance assessment: Mild deficits observed, not formally tested                                          ADL either performed or assessed with clinical judgement   ADL Overall ADL's : Needs assistance/impaired     Grooming: Supervision/safety;Standing   Upper Body Bathing: Supervision/ safety;Set up;Sitting   Lower Body Bathing: Set up;Supervison/ safety;Sitting/lateral leans;Cueing for back precautions;Cueing for compensatory techniques Lower Body Bathing Details (indicate cue type and reason): pt demonstrates lateral lean and figure 4 technique for bathing with supervision to adhere to precautions  Upper Body Dressing : Supervision/safety;Set up;Sitting   Lower Body Dressing: Supervision/safety;Set up;Cueing for compensatory techniques;Cueing for back precautions;Sit to/from stand Lower Body Dressing Details (indicate cue type and reason): demonstrates figure 4 techniques with supervision Toilet Transfer: Supervision/safety;Ambulation;BSC;RW   Toileting- Clothing Manipulation and Hygiene: Supervision/safety;Cueing for compensatory techniques;Cueing for back precautions;Sit to/from stand Toileting - Clothing Manipulation Details (indicate cue type and reason): reviewed compensatory techniques for safety and precautions  Tub/ Shower Transfer: Tub transfer;Min guard;Cueing for safety;Adhering to back precautions;Ambulation;3 in 1 Tub/Shower Transfer Details (indicate cue type and reason): reviewed safe technique with reverse step over tub to 3:1, demonstrates good technique and safety Functional mobility during ADLs: Supervision/safety;Rolling walker General ADL Comments: min cueing for safety and precaution adherance, educated on precautions, ADL compensatory techniques and body mechanics     Vision Baseline Vision/History: Wears glasses Wears Glasses: Reading only Patient Visual Report: No change from baseline Vision Assessment?: No apparent visual deficits     Perception  Praxis      Pertinent Vitals/Pain Pain Assessment:  Faces Faces Pain Scale: Hurts little more Pain Location: back Pain Descriptors / Indicators: Discomfort;Operative site guarding Pain Intervention(s): Monitored during session;Repositioned     Hand Dominance Right   Extremity/Trunk Assessment Upper Extremity Assessment Upper Extremity Assessment: Overall WFL for tasks assessed   Lower Extremity Assessment Lower Extremity Assessment: Defer to PT evaluation   Cervical / Trunk Assessment Cervical / Trunk Assessment: Other exceptions Cervical / Trunk Exceptions: s/p back surgery   Communication Communication Communication: No difficulties   Cognition Arousal/Alertness: Awake/alert Behavior During Therapy: WFL for tasks assessed/performed Overall Cognitive Status: Within Functional Limits for tasks assessed                                     General Comments       Exercises     Shoulder Instructions      Home Living Family/patient expects to be discharged to:: Private residence Living Arrangements: Spouse/significant other;Other (Comment)(granddaughter) Available Help at Discharge: Family;Available 24 hours/day Type of Home: House Home Access: Stairs to enter [employer] of Steps: 3 Entrance Stairs-Rails: Left Home Layout: One level     Bathroom Shower/Tub: Teacher, early years/pre: Standard     Home Equipment: Environmental consultant - 4 wheels          Prior Functioning/Environment Level of Independence: Independent with assistive device(s)        Comments: reports independent with ADLs, used rollator at times for mobility due to pain, limited IADLs        OT Problem List: Decreased activity tolerance;Impaired balance (sitting and/or standing);Decreased safety awareness;Decreased knowledge of precautions;Decreased knowledge of use of DME or AE;Pain      OT Treatment/Interventions:      OT Goals(Current goals can be found in the care plan section) Acute Rehab OT Goals Patient  Stated Goal: home today OT Goal Formulation: With patient  OT Frequency:     Barriers to D/C:            Co-evaluation              AM-PAC PT "6 Clicks" Daily Activity     Outcome Measure Help from another person eating meals?: None Help from another person taking care of personal grooming?: None Help from another person toileting, which includes using toliet, bedpan, or urinal?: None Help from another person bathing (including washing, rinsing, drying)?: None Help from another person to put on and taking off regular upper body clothing?: None Help from another person to put on and taking off regular lower body clothing?: None 6 Click Score: 24   End of Session Equipment Utilized During Treatment: Rolling walker;Back brace Nurse Communication: Mobility status  Activity Tolerance: Patient tolerated treatment well Patient left: with call bell/phone within reach;with family/visitor present;Other (comment)(seated EOB)  OT Visit Diagnosis: Unsteadiness on feet (R26.81);Pain Pain - part of body: (back incision)                Time: 0626-9485 OT Time Calculation (min): 20 min Charges:  OT General Charges $OT Visit: 1 Visit OT Evaluation $OT Eval Low Complexity: Wallace, OT Acute Rehabilitation Services Pager [phone] Office [phone]   Delight Stare 04/08/2018, 9:10 AM

## 2018-04-08 NOTE — Progress Notes (Signed)
Pt doing well. Pt and family given D/C instructions with Rx's, verbal understanding was provided. Pt's incision is clean and dry with no sign of infection. Pt's IV was removed prior to D/C. Pt received 3-n-1 from [place] prior to D/C. Pt D/C'd home via wheelchair per MD order. Pt is stable @ D/C and has no other needs at this time. Holli Humbles, RN

## 2018-04-08 NOTE — Discharge Summary (Addendum)
Physician Discharge Summary  Patient ID: Michelle [name] MRN: [id number] DOB/AGE: 1950/07/31 67 y.o.  Admit date: 04/07/2018 Discharge date: 04/08/2018  Admission Diagnoses:  Lumbar radiculopathy  Discharge Diagnoses:  Same Active Problems:   Lumbar radiculopathy   Discharged Condition: Stable  Hospital Course:  Michelle [name] is a 67 y.o. female who was admitted for the below procedure. There were no post operative complications. At time of discharge, pain was well controlled, ambulating with Pt/OT, tolerating po, voiding normal. Ready for discharge.  Treatments: Surgery - L4-5 PLIF  Discharge Exam: Blood pressure 93/62, pulse 73, temperature 98.5 F (36.9 C), temperature source Oral, resp. rate 18, height 5\' 3"  (1.6 m), weight 66.2 kg, SpO2 99 %. Awake, alert, oriented Speech fluent, appropriate CN grossly intact 5/5 BUE/BLE Wound c/d/i  Disposition: Discharge disposition: 01-Home or Self Care       Discharge Instructions    Call MD for:  difficulty breathing, headache or visual disturbances   Complete by:  As directed    Call MD for:  persistant dizziness or light-headedness   Complete by:  As directed    Call MD for:  redness, tenderness, or signs of infection (pain, swelling, redness, odor or green/yellow discharge around incision site)   Complete by:  As directed    Call MD for:  severe uncontrolled pain   Complete by:  As directed    Call MD for:  temperature >100.4   Complete by:  As directed    Diet general   Complete by:  As directed    Driving Restrictions   Complete by:  As directed    Do not drive until given clearance.   Increase activity slowly   Complete by:  As directed    Lifting restrictions   Complete by:  As directed    Do not lift anything >10lbs. Avoid bending and twisting in awkward positions. Avoid bending at the back.   May shower / Bathe   Complete by:  As directed    In 24 hours. Okay to wash wound with warm soapy water.  Avoid scrubbing the wound. Pat dry.   Remove dressing in 24 hours   Complete by:  As directed      Allergies as of 04/08/2018      Reactions   Zofran [ondansetron Hcl] Other (See Comments)   Prolong QT- pt unsure of this allergy   Ace Inhibitors Cough   Occurred with liinopril   Levofloxacin Other (See Comments)   Felt things on her legs that were not there,made her stomach hurt   Lisinopril Cough   Sulfonamide Derivatives Hives, Itching      Medication List    STOP taking these medications   gabapentin 300 MG capsule Commonly known as:  NEURONTIN   polyethylene glycol powder powder Commonly known as:  GLYCOLAX/MIRALAX   primidone 50 MG tablet Commonly known as:  MYSOLINE     TAKE these medications   albuterol 108 (90 Base) MCG/ACT inhaler Commonly known as:  PROVENTIL HFA;VENTOLIN HFA INHALE TWO PUFFS BY MOUTH EVERY 4 HOURS AS NEEDED FOR WHEEZING FOR SHORTNESS OF BREATH   aspirin EC 81 MG tablet Take 1 tablet (81 mg total) by mouth daily. Start taking on:  04/13/2018 What changed:  These instructions start on 04/13/2018. If you are unsure what to do until then, ask your doctor or other care provider.   azelastine 0.05 % ophthalmic solution Commonly known as:  OPTIVAR Place 1 drop into both eyes 2 (two)  times daily as needed (itchy eyes.).   cetirizine 10 MG tablet Commonly known as:  ZYRTEC Take 1 tablet (10 mg total) by mouth daily. What changed:  when to take this   diclofenac sodium 1 % Gel Commonly known as:  VOLTAREN Apply 2 g topically 4 (four) times daily as needed (for pain).   Dulaglutide 0.75 MG/0.5ML Sopn Inject 0.75 mg into the skin once a week. What changed:  when to take this   fluticasone 50 MCG/ACT nasal spray Commonly known as:  FLONASE Place 2 sprays into both nostrils daily. What changed:    when to take this  reasons to take this   glucose blood test strip Use to check blood sugar four times daily or as directed.    hydrocortisone 1 % ointment Apply 1 application topically 2 (two) times daily. What changed:    when to take this  reasons to take this   hydrOXYzine 25 MG tablet Commonly known as:  ATARAX/VISTARIL Take 25 mg by mouth every 8 (eight) hours as needed for itching.   inFLIXimab 100 MG injection Commonly known as:  REMICADE Inject 100 mg into the vein every 7 (seven) weeks.   leflunomide 10 MG tablet Commonly known as:  ARAVA Take 10 mg by mouth daily.   losartan 25 MG tablet Commonly known as:  COZAAR Take 1 tablet (25 mg total) by mouth daily.   metFORMIN 1000 MG tablet Commonly known as:  GLUCOPHAGE Take 1 tablet (1,000 mg total) by mouth 2 (two) times daily with a meal.   methocarbamol 750 MG tablet Commonly known as:  ROBAXIN Take 1 tablet (750 mg total) by mouth 3 (three) times daily as needed for muscle spasms.   montelukast 10 MG tablet Commonly known as:  SINGULAIR Take 10 mg by mouth daily.   nitroGLYCERIN 0.4 MG SL tablet Commonly known as:  NITROSTAT Place 1 tablet (0.4 mg total) under the tongue every 5 (five) minutes as needed. What changed:  reasons to take this   omeprazole 40 MG capsule Commonly known as:  PRILOSEC Take 1 capsule (40 mg total) by mouth 2 (two) times daily. What changed:  when to take this   ONGLYZA 5 MG Tabs tablet Generic drug:  saxagliptin HCl Take 5 mg by mouth daily.   oxyCODONE-acetaminophen 10-325 MG tablet Commonly known as:  PERCOCET Take 1 tablet by mouth every 6 (six) hours as needed for pain.   ranitidine 150 MG tablet Commonly known as:  ZANTAC Take by mouth.   rosuvastatin 10 MG tablet Commonly known as:  CRESTOR Take 1 tablet (10 mg total) by mouth daily. What changed:  when to take this   sertraline 50 MG tablet Commonly known as:  ZOLOFT Take 1 tablet (50 mg total) by mouth daily. What changed:  when to take this   triamcinolone 0.1 % paste Commonly known as:  KENALOG USE AS DIRECTED IN   MOUTH/THROAT  TO  THE  LESION  TWICE  DAILY What changed:  See the new instructions.   triamcinolone cream 0.1 % Commonly known as:  KENALOG Apply 1 application topically 2 (two) times daily as needed (for skin irritation).      Follow-up Information    Consuella Lose, MD. Schedule an appointment as soon as possible for a visit in 3 week(s).   Specialty:  Neurosurgery Contact information: [address] [place] [address] [place] [address] [phone]           Signed: Ferne Reus  J 04/08/2018, 7:53 AM

## 2018-04-09 DIAGNOSIS — Z79899 Other long term (current) drug therapy: Secondary | ICD-10-CM | POA: Diagnosis not present

## 2018-04-09 DIAGNOSIS — Z4789 Encounter for other orthopedic aftercare: Secondary | ICD-10-CM | POA: Diagnosis not present

## 2018-04-09 DIAGNOSIS — M0600 Rheumatoid arthritis without rheumatoid factor, unspecified site: Secondary | ICD-10-CM | POA: Diagnosis not present

## 2018-04-09 DIAGNOSIS — E119 Type 2 diabetes mellitus without complications: Secondary | ICD-10-CM | POA: Diagnosis not present

## 2018-04-09 DIAGNOSIS — Z794 Long term (current) use of insulin: Secondary | ICD-10-CM | POA: Diagnosis not present

## 2018-04-10 ENCOUNTER — Encounter: Payer: Self-pay | Admitting: Gastroenterology

## 2018-04-10 NOTE — Op Note (Signed)
  NEUROSURGERY OPERATIVE NOTE   PREOP DIAGNOSIS:  1. Lumbar stenosis 2. Spondylolisthesis, L4-5  POSTOP DIAGNOSIS: Same  PROCEDURE: 1. L4 laminectomy with facetectomy for decompression of exiting nerve roots, more than would be required for placement of interbody graft 2. Placement of anterior interbody device - Medtronic expandable cage x2 3. Posterior non-segmental instrumentation using cortical pedicle screws at L4 - L5 4. Interbody arthrodesis, L4-5 5. Use of locally harvested bone autograft 6. Use of non-structural bone allograft - BMP-2, [place]  SURGEON: Dr. Consuella Lose, MD  ASSISTANT: Ferne Reus, PA-C  ANESTHESIA: General Endotracheal  EBL: See anesthesia records  SPECIMENS: None  DRAINS: None  COMPLICATIONS: None immediate  CONDITION: Hemodynamically stable to PACU  HISTORY: GAILE ALLMON is a 67 y.o. female who has been followed in the outpatient clinic with back and left greater than right leg pain likely related to stenosis with trace grade 1 spondylolisthesis at L4-5. She attempted multiple conservative treatments and ultimately elected to proceed with surgical decompression and fusion. Risks and benefits were reviewed and consent was obtained.  PROCEDURE IN DETAIL: After informed consent was obtained and witnessed, the patient was brought to the operating room. After induction of general anesthesia, the patient was positioned on the operative table in the prone position. All pressure points were meticulously padded. Incision was then marked out and prepped and draped in the usual sterile fashion.  After timeout was conducted, skin was infiltrated with local anesthetic. Skin incision was then made sharply and Bovie electrocautery was used to dissect the subcutaneous tissue until the lumbodorsal fascia was identified and incised. The muscle was then elevated in the subperiosteal plane and the L4 lamina and L4-5 facet complexes were identified.  Self-retaining retractors were then placed.  Location was confirmed with intraoperative fluoroscopy.  At this point attention was turned to decompression. Complete L4 laminectomy with facetectomy was completed using a combination of Kerrison rongeurs and a high-speed drill.  There was a significant amount of bilateral facet arthropathy with resultant lateral recess stenosis bilaterally.  Disc space was then identified, incised, and using a combination of shavers, curettes and rongeurs, complete discectomy was completed.  Once discectomy was completed, I was able to easily pass a ball-tipped dissector underneath the thecal sac in the ventral epidural space, and out the foramina at L4 and L5 bilaterally.  Endplates were prepared with curettes and rasps, and bone harvested during decompression was mixed with BMP and grafton and packed into the interspace. An expandable cage was tapped into place bilaterally, position was confirmed with intraoperative fluoroscopy.  The cages were then expanded maximally.  Good position was confirmed with fluoroscopy.  At this point, the L4 and L5 entry points for cortical pedicle screws were identified with fluoroscopic guidance, pilot holes were drilled and tapped to 5.5 mm. Screws were then placed in L4 and L5. Rod was then placed, set screws placed and final tightened. Final AP and lateral fluoroscopic images confirmed good position.  The wound was then irrigated with copious amounts of antibiotic saline, then closed in standard fashion using a combination of interrupted 0 and 3-0 Vicryl stitches in the muscular, fascial, and subcutaneous layers. Skin was then closed using standard Dermabond. Sterile dressing was then applied. The patient was then transferred to the stretcher, extubated, and taken to the postanesthesia care unit in stable hemodynamic condition.  At the end of the case all sponge, needle, cottonoid, and instrument counts were correct.

## 2018-04-11 DIAGNOSIS — M0600 Rheumatoid arthritis without rheumatoid factor, unspecified site: Secondary | ICD-10-CM | POA: Diagnosis not present

## 2018-04-11 DIAGNOSIS — E119 Type 2 diabetes mellitus without complications: Secondary | ICD-10-CM | POA: Diagnosis not present

## 2018-04-11 DIAGNOSIS — Z794 Long term (current) use of insulin: Secondary | ICD-10-CM | POA: Diagnosis not present

## 2018-04-11 DIAGNOSIS — Z79899 Other long term (current) drug therapy: Secondary | ICD-10-CM | POA: Diagnosis not present

## 2018-04-11 DIAGNOSIS — Z4789 Encounter for other orthopedic aftercare: Secondary | ICD-10-CM | POA: Diagnosis not present

## 2018-04-13 DIAGNOSIS — Z79899 Other long term (current) drug therapy: Secondary | ICD-10-CM | POA: Diagnosis not present

## 2018-04-13 DIAGNOSIS — Z4789 Encounter for other orthopedic aftercare: Secondary | ICD-10-CM | POA: Diagnosis not present

## 2018-04-13 DIAGNOSIS — Z794 Long term (current) use of insulin: Secondary | ICD-10-CM | POA: Diagnosis not present

## 2018-04-13 DIAGNOSIS — M0600 Rheumatoid arthritis without rheumatoid factor, unspecified site: Secondary | ICD-10-CM | POA: Diagnosis not present

## 2018-04-13 DIAGNOSIS — E119 Type 2 diabetes mellitus without complications: Secondary | ICD-10-CM | POA: Diagnosis not present

## 2018-04-15 DIAGNOSIS — E119 Type 2 diabetes mellitus without complications: Secondary | ICD-10-CM | POA: Diagnosis not present

## 2018-04-15 DIAGNOSIS — Z79899 Other long term (current) drug therapy: Secondary | ICD-10-CM | POA: Diagnosis not present

## 2018-04-15 DIAGNOSIS — M0600 Rheumatoid arthritis without rheumatoid factor, unspecified site: Secondary | ICD-10-CM | POA: Diagnosis not present

## 2018-04-15 DIAGNOSIS — Z4789 Encounter for other orthopedic aftercare: Secondary | ICD-10-CM | POA: Diagnosis not present

## 2018-04-15 DIAGNOSIS — Z794 Long term (current) use of insulin: Secondary | ICD-10-CM | POA: Diagnosis not present

## 2018-04-16 DIAGNOSIS — Z794 Long term (current) use of insulin: Secondary | ICD-10-CM | POA: Diagnosis not present

## 2018-04-16 DIAGNOSIS — E119 Type 2 diabetes mellitus without complications: Secondary | ICD-10-CM | POA: Diagnosis not present

## 2018-04-16 DIAGNOSIS — Z4789 Encounter for other orthopedic aftercare: Secondary | ICD-10-CM | POA: Diagnosis not present

## 2018-04-16 DIAGNOSIS — Z79899 Other long term (current) drug therapy: Secondary | ICD-10-CM | POA: Diagnosis not present

## 2018-04-16 DIAGNOSIS — M0600 Rheumatoid arthritis without rheumatoid factor, unspecified site: Secondary | ICD-10-CM | POA: Diagnosis not present

## 2018-04-20 DIAGNOSIS — Z4789 Encounter for other orthopedic aftercare: Secondary | ICD-10-CM | POA: Diagnosis not present

## 2018-04-20 DIAGNOSIS — Z79899 Other long term (current) drug therapy: Secondary | ICD-10-CM | POA: Diagnosis not present

## 2018-04-20 DIAGNOSIS — Z794 Long term (current) use of insulin: Secondary | ICD-10-CM | POA: Diagnosis not present

## 2018-04-20 DIAGNOSIS — E119 Type 2 diabetes mellitus without complications: Secondary | ICD-10-CM | POA: Diagnosis not present

## 2018-04-20 DIAGNOSIS — M0600 Rheumatoid arthritis without rheumatoid factor, unspecified site: Secondary | ICD-10-CM | POA: Diagnosis not present

## 2018-04-28 ENCOUNTER — Ambulatory Visit: Payer: Medicare Other | Admitting: Family Medicine

## 2018-04-28 DIAGNOSIS — L570 Actinic keratosis: Secondary | ICD-10-CM | POA: Diagnosis not present

## 2018-04-28 DIAGNOSIS — D485 Neoplasm of uncertain behavior of skin: Secondary | ICD-10-CM | POA: Diagnosis not present

## 2018-04-28 DIAGNOSIS — L578 Other skin changes due to chronic exposure to nonionizing radiation: Secondary | ICD-10-CM | POA: Diagnosis not present

## 2018-04-28 DIAGNOSIS — C44729 Squamous cell carcinoma of skin of left lower limb, including hip: Secondary | ICD-10-CM | POA: Diagnosis not present

## 2018-04-29 DIAGNOSIS — N3941 Urge incontinence: Secondary | ICD-10-CM | POA: Diagnosis not present

## 2018-04-29 DIAGNOSIS — Z6826 Body mass index (BMI) 26.0-26.9, adult: Secondary | ICD-10-CM | POA: Diagnosis not present

## 2018-04-29 DIAGNOSIS — M4316 Spondylolisthesis, lumbar region: Secondary | ICD-10-CM | POA: Diagnosis not present

## 2018-04-29 DIAGNOSIS — I10 Essential (primary) hypertension: Secondary | ICD-10-CM | POA: Diagnosis not present

## 2018-04-29 DIAGNOSIS — N3000 Acute cystitis without hematuria: Secondary | ICD-10-CM | POA: Diagnosis not present

## 2018-05-07 ENCOUNTER — Other Ambulatory Visit: Payer: Self-pay

## 2018-05-07 DIAGNOSIS — E119 Type 2 diabetes mellitus without complications: Secondary | ICD-10-CM

## 2018-05-08 MED ORDER — DULAGLUTIDE 0.75 MG/0.5ML ~~LOC~~ SOAJ: 0.7500 mg | SUBCUTANEOUS | 3 refills | Status: DC

## 2018-05-14 DIAGNOSIS — Z79899 Other long term (current) drug therapy: Secondary | ICD-10-CM | POA: Diagnosis not present

## 2018-05-14 DIAGNOSIS — M059 Rheumatoid arthritis with rheumatoid factor, unspecified: Secondary | ICD-10-CM | POA: Diagnosis not present

## 2018-05-18 DIAGNOSIS — H903 Sensorineural hearing loss, bilateral: Secondary | ICD-10-CM | POA: Diagnosis not present

## 2018-06-02 DIAGNOSIS — Z5181 Encounter for therapeutic drug level monitoring: Secondary | ICD-10-CM | POA: Diagnosis not present

## 2018-06-02 DIAGNOSIS — W19XXXA Unspecified fall, initial encounter: Secondary | ICD-10-CM | POA: Diagnosis not present

## 2018-06-02 DIAGNOSIS — M059 Rheumatoid arthritis with rheumatoid factor, unspecified: Secondary | ICD-10-CM | POA: Diagnosis not present

## 2018-06-02 DIAGNOSIS — Z79899 Other long term (current) drug therapy: Secondary | ICD-10-CM | POA: Diagnosis not present

## 2018-06-02 DIAGNOSIS — M48062 Spinal stenosis, lumbar region with neurogenic claudication: Secondary | ICD-10-CM | POA: Diagnosis not present

## 2018-06-02 DIAGNOSIS — R251 Tremor, unspecified: Secondary | ICD-10-CM | POA: Diagnosis not present

## 2018-06-02 DIAGNOSIS — R413 Other amnesia: Secondary | ICD-10-CM | POA: Diagnosis not present

## 2018-06-04 ENCOUNTER — Ambulatory Visit (INDEPENDENT_AMBULATORY_CARE_PROVIDER_SITE_OTHER): Payer: Medicare Other | Admitting: Student in an Organized Health Care Education/Training Program

## 2018-06-04 ENCOUNTER — Encounter: Payer: Self-pay | Admitting: Student in an Organized Health Care Education/Training Program

## 2018-06-04 ENCOUNTER — Other Ambulatory Visit: Payer: Self-pay

## 2018-06-04 VITALS — BP 104/50 | HR 88 | Temp 98.2°F | Wt 151.6 lb

## 2018-06-04 DIAGNOSIS — E119 Type 2 diabetes mellitus without complications: Secondary | ICD-10-CM

## 2018-06-04 DIAGNOSIS — G3184 Mild cognitive impairment, so stated: Secondary | ICD-10-CM | POA: Diagnosis not present

## 2018-06-04 DIAGNOSIS — G894 Chronic pain syndrome: Secondary | ICD-10-CM | POA: Diagnosis not present

## 2018-06-04 LAB — POCT GLYCOSYLATED HEMOGLOBIN (HGB A1C): HBA1C, POC (CONTROLLED DIABETIC RANGE): 6.7 % (ref 0.0–7.0)

## 2018-06-04 MED ORDER — OXYCODONE-ACETAMINOPHEN 10-325 MG PO TABS: 1.0000 | ORAL_TABLET | Freq: Four times a day (QID) | ORAL | 0 refills | Status: DC | PRN

## 2018-06-04 MED ORDER — DONEPEZIL HCL 5 MG PO TABS: 5.0000 mg | ORAL_TABLET | Freq: Every day | ORAL | 0 refills | Status: DC

## 2018-06-04 NOTE — Patient Instructions (Addendum)
It was a pleasure seeing you today in our clinic. Here is the treatment plan we have discussed and agreed upon together:  We started a new medication. Please call me in one month and let me know if you are tolerating this medication so we can adjust the dose if necessary.  Great job with your diabetes. We will keep everything the same for diabetes.  We will STOP your losartan blood pressure medicine. Please schedule a nurse visit to have your blood pressure checked in one week. Please call us if you have headaches or blurry vision or chest pain.  Our clinic's number is [phone]. Please call with questions or concerns about what we discussed today.  Be well, Dr. Burr Medico

## 2018-06-04 NOTE — Progress Notes (Signed)
CC: memory loss  HPI: Michelle [name] is a 67 y.o. female with PMH significant the following: Past Medical History:  Diagnosis Date  . Allergy   . Anxiety   . Asthma   . Cancer ([place])    skin CA  . Cataract    beginning on left eye  . Depression   . Diabetes mellitus    Type II  . Diverticulosis   . GERD (gastroesophageal reflux disease)   . HLD (hyperlipidemia)   . Hypertension   . Lung nodules   . Osteoporosis   . RA (rheumatoid arthritis) ([facility])    CCP positive followed by [facility]   who presents to [facility] today with concerns about memory loss for the last 3-4 months.   Memory Loss No new medicines have been prescribed recently. She reports that she got lost on her way home and went 8-9 miles out of the way. On another occasion she made a mistake in a recipe that she is very familiar with. She is concerned about the possibility that she isdeveloping dementia.   She denies symptoms of depression. No urinary symptoms such as dysuria or urinary frequency. No ataxia. No visual/auditory hallucinations.   Diabetes She reports she has been taking trulicity, metformin, onglyza with no difficulty. Denies episodes of hypoglycemia. No polydipsia. She feels she has urinary frequency at baseline. A1c improved at 6.7 and 7.4.  Review of Symptoms:  See HPI for ROS.   CC, SH/smoking status, and VS noted.  Objective: BP (!) 104/50   Pulse 88   Temp 98.2 F (36.8 C) (Oral)   Wt 151 lb 9.6 oz (68.8 kg)   SpO2 94%   BMI 26.85 kg/m  GEN: NAD, alert, cooperative, and pleasant. RESPIRATORY: Comfortable work of breathing, speaks in full sentences CV: Regular rate noted, distal extremities well perfused and warm without edema GI: Soft, nondistended SKIN: warm and dry, no rashes or lesions NEURO: II-XII grossly intact MSK: Moves 4 extremities equally PSYCH: AAOx3, appropriate affect   MoCA - 26/30 today with points off for Serial subtraction, Language # of words that begin with letter  F, drawing a cube and delayed recall. Scanned into the chart.   Assessment and plan:  Memory Loss Mild cognitive impairment. MOCA score 24.  - doneprazole 5 mg qd - patient to call in 1 month. If she is tolerating 5 mg, would increase dose to 10 mg qd (side effects may include GI upset) - discussed with patient that some level of delayed recall is normal aging and it is impossible to be certain whether her current symptoms are indicative of normal aging or a more rapid cognitive decline such as dementia/alzeheimers. advised patient to consider getting her affairs in order over the next 6 months to a year to be on the safe side in case her memory loss becomes more progressive. She is agreeable and understanding of this plan. - follow up in 6 months for repeat MOCA  HTN - BP on the low side today. Patient does not describe consistent orthostatic symptoms however she does sometimes feel dizzy. No syncope or near-syncope.  - hold losartan - follow up in 1 week for BP check in nurse clinic  Diabetes - improvement in control Continue current regimen. Follow up 3 months.     Orders Placed This Encounter  Procedures  . HgB A1c    Meds ordered this encounter  Medications  . DISCONTD: oxyCODONE-acetaminophen (PERCOCET) 10-325 MG tablet    Sig: Take 1  tablet by mouth every 6 (six) hours as needed for pain.    Dispense:  120 tablet    Refill:  0    Fill on or after 12/20  . DISCONTD: oxyCODONE-acetaminophen (PERCOCET) 10-325 MG tablet    Sig: Take 1 tablet by mouth every 6 (six) hours as needed for pain.    Dispense:  120 tablet    Refill:  0    Fill on or after 07/26/18  . oxyCODONE-acetaminophen (PERCOCET) 10-325 MG tablet    Sig: Take 1 tablet by mouth every 6 (six) hours as needed for pain.    Dispense:  120 tablet    Refill:  0    Fill on or after 08/26/18  . donepezil (ARICEPT) 5 MG tablet    Sig: Take 1 tablet (5 mg total) by mouth at bedtime.    Dispense:  30 tablet    Refill:   0     Everrett Coombe, MD,MS,  PGY3 06/04/2018 3:25 PM

## 2018-06-10 ENCOUNTER — Telehealth: Payer: Self-pay | Admitting: *Deleted

## 2018-06-10 NOTE — Telephone Encounter (Signed)
Pt was supposed to come in for a BP check this week.   She calls because she just went to her back MD today and her BP was 125/77.   Advised that since we had that reading I did not think she would need to come in but I wanted to check with Dr. Burr Medico.  She denies CP, SOB or blurry vision.  Will send to MD to advised if appt is needed.    Ravan Schlemmer, Salome Spotted, CMA

## 2018-06-10 NOTE — Telephone Encounter (Signed)
Looks like her BP was perfect at her other appointment! I think we can use that reading, no need to come back in for BP check. Thank you!

## 2018-06-22 ENCOUNTER — Telehealth: Payer: Self-pay

## 2018-06-22 NOTE — Telephone Encounter (Signed)
Pt called nurse lines stating she has been taking the new medication Aricept as instructed since 12/5. Pt states since starting this medication she has been having more asthma flares, urinary retention and "I have been so ill." As of last night pt is no longer taking this medication. Pt wants to know what the next steps are.

## 2018-06-23 NOTE — Telephone Encounter (Signed)
I am covering Dr. Ernestina Penna inbox. It is fine to stop this medication if she feels it is not agreeing with her. Next step would be to come back in to see Dr. Burr Medico to talk about options and check over meds.

## 2018-06-25 NOTE — Telephone Encounter (Signed)
Spoke to pt and gave her the information below. She has already stopped taking the medication. She will call and schedule an appointment after the holidays. Ottis Stain, CMA

## 2018-07-02 DIAGNOSIS — H01021 Squamous blepharitis right upper eyelid: Secondary | ICD-10-CM | POA: Diagnosis not present

## 2018-07-02 DIAGNOSIS — H01024 Squamous blepharitis left upper eyelid: Secondary | ICD-10-CM | POA: Diagnosis not present

## 2018-07-02 DIAGNOSIS — H01025 Squamous blepharitis left lower eyelid: Secondary | ICD-10-CM | POA: Diagnosis not present

## 2018-07-02 DIAGNOSIS — Z961 Presence of intraocular lens: Secondary | ICD-10-CM | POA: Diagnosis not present

## 2018-07-02 DIAGNOSIS — H04123 Dry eye syndrome of bilateral lacrimal glands: Secondary | ICD-10-CM | POA: Diagnosis not present

## 2018-07-02 DIAGNOSIS — H01022 Squamous blepharitis right lower eyelid: Secondary | ICD-10-CM | POA: Diagnosis not present

## 2018-07-02 DIAGNOSIS — H25011 Cortical age-related cataract, right eye: Secondary | ICD-10-CM | POA: Diagnosis not present

## 2018-07-07 ENCOUNTER — Other Ambulatory Visit: Payer: Self-pay | Admitting: Student in an Organized Health Care Education/Training Program

## 2018-07-07 DIAGNOSIS — Z1231 Encounter for screening mammogram for malignant neoplasm of breast: Secondary | ICD-10-CM

## 2018-07-09 DIAGNOSIS — M059 Rheumatoid arthritis with rheumatoid factor, unspecified: Secondary | ICD-10-CM | POA: Diagnosis not present

## 2018-07-13 NOTE — Addendum Note (Signed)
Addendum  created 07/13/18 2351 by Belinda Block, MD   Intraprocedure Event deleted, Intraprocedure Event edited

## 2018-07-30 DIAGNOSIS — L309 Dermatitis, unspecified: Secondary | ICD-10-CM | POA: Diagnosis not present

## 2018-07-30 DIAGNOSIS — D692 Other nonthrombocytopenic purpura: Secondary | ICD-10-CM | POA: Diagnosis not present

## 2018-07-30 DIAGNOSIS — L2089 Other atopic dermatitis: Secondary | ICD-10-CM | POA: Diagnosis not present

## 2018-07-30 DIAGNOSIS — L299 Pruritus, unspecified: Secondary | ICD-10-CM | POA: Diagnosis not present

## 2018-07-30 DIAGNOSIS — L981 Factitial dermatitis: Secondary | ICD-10-CM | POA: Diagnosis not present

## 2018-08-06 ENCOUNTER — Ambulatory Visit: Payer: Medicare Other | Admitting: Neurology

## 2018-08-10 ENCOUNTER — Ambulatory Visit
Admission: RE | Admit: 2018-08-10 | Discharge: 2018-08-10 | Disposition: A | Discharge status: Home / Self Care | Payer: Medicare Other | Source: Ambulatory Visit | Attending: Hematology | Admitting: Hematology

## 2018-08-10 DIAGNOSIS — Z1231 Encounter for screening mammogram for malignant neoplasm of breast: Secondary | ICD-10-CM

## 2018-08-11 ENCOUNTER — Ambulatory Visit: Payer: Medicare Other | Admitting: Neurology

## 2018-08-20 ENCOUNTER — Other Ambulatory Visit: Payer: Self-pay | Admitting: Neurology

## 2018-08-20 ENCOUNTER — Other Ambulatory Visit: Payer: Self-pay | Admitting: Student in an Organized Health Care Education/Training Program

## 2018-08-20 DIAGNOSIS — E119 Type 2 diabetes mellitus without complications: Secondary | ICD-10-CM

## 2018-08-21 DIAGNOSIS — Z961 Presence of intraocular lens: Secondary | ICD-10-CM | POA: Diagnosis not present

## 2018-08-21 DIAGNOSIS — H02834 Dermatochalasis of left upper eyelid: Secondary | ICD-10-CM | POA: Diagnosis not present

## 2018-08-21 DIAGNOSIS — H40013 Open angle with borderline findings, low risk, bilateral: Secondary | ICD-10-CM | POA: Diagnosis not present

## 2018-08-21 DIAGNOSIS — E119 Type 2 diabetes mellitus without complications: Secondary | ICD-10-CM | POA: Diagnosis not present

## 2018-08-21 DIAGNOSIS — H1045 Other chronic allergic conjunctivitis: Secondary | ICD-10-CM | POA: Diagnosis not present

## 2018-08-21 DIAGNOSIS — H02831 Dermatochalasis of right upper eyelid: Secondary | ICD-10-CM | POA: Diagnosis not present

## 2018-08-21 DIAGNOSIS — H2511 Age-related nuclear cataract, right eye: Secondary | ICD-10-CM | POA: Diagnosis not present

## 2018-08-21 MED ORDER — ONGLYZA 5 MG PO TABS: 5.0000 mg | ORAL_TABLET | Freq: Every day | ORAL | 0 refills | Status: DC

## 2018-09-02 ENCOUNTER — Ambulatory Visit (INDEPENDENT_AMBULATORY_CARE_PROVIDER_SITE_OTHER): Payer: Medicare Other | Admitting: Student in an Organized Health Care Education/Training Program

## 2018-09-02 ENCOUNTER — Other Ambulatory Visit: Payer: Self-pay

## 2018-09-02 ENCOUNTER — Encounter: Payer: Self-pay | Admitting: Student in an Organized Health Care Education/Training Program

## 2018-09-02 VITALS — BP 138/68 | HR 83 | Temp 98.3°F | Ht 62.0 in | Wt 149.4 lb

## 2018-09-02 DIAGNOSIS — I10 Essential (primary) hypertension: Secondary | ICD-10-CM | POA: Diagnosis not present

## 2018-09-02 DIAGNOSIS — E119 Type 2 diabetes mellitus without complications: Secondary | ICD-10-CM

## 2018-09-02 DIAGNOSIS — G3184 Mild cognitive impairment, so stated: Secondary | ICD-10-CM

## 2018-09-02 DIAGNOSIS — G8929 Other chronic pain: Secondary | ICD-10-CM | POA: Diagnosis not present

## 2018-09-02 DIAGNOSIS — G894 Chronic pain syndrome: Secondary | ICD-10-CM | POA: Diagnosis not present

## 2018-09-02 DIAGNOSIS — R413 Other amnesia: Secondary | ICD-10-CM | POA: Diagnosis not present

## 2018-09-02 LAB — POCT GLYCOSYLATED HEMOGLOBIN (HGB A1C): HbA1c, POC (controlled diabetic range): 7.1 % — AB (ref 0.0–7.0)

## 2018-09-02 MED ORDER — OXYCODONE-ACETAMINOPHEN 10-325 MG PO TABS: 1.0000 | ORAL_TABLET | Freq: Four times a day (QID) | ORAL | 0 refills | Status: DC | PRN

## 2018-09-02 MED ORDER — DONEPEZIL HCL 5 MG PO TABS: 5.0000 mg | ORAL_TABLET | Freq: Every day | ORAL | 0 refills | Status: DC

## 2018-09-02 NOTE — Patient Instructions (Signed)
Please schedule a visit with [facility] as you leave the office today.

## 2018-09-02 NOTE — Progress Notes (Signed)
CC: chronic pain med refill and T2DM follow up.  HPI: Michelle [name] is a 68 y.o. female  Presenting for follow up of T2DM with multiple other complaints  HYPERTENSION Disease Monitoring: does not check at home Blood pressure range-normotensive in the office  Chest pain, palpitations- denies       Dyspnea- denies Medications: was previously on ACE but reports this was discontinued due to hypotension Compliance- N/A Lightheadedness,Syncope- denies    Edema- denies  DIABETES Disease Monitoring: A1c 7.1 from 6.7 previously Blood Sugar ranges-uncertain  Polyuria/phagia/dipsia- denies       Visual problems- denies Medications: metformin 4765 mg BID, trulicity, onglyza Compliance- 100% reported  Hypoglycemic symptoms- denies Patient is on ASA.  Concern about memory loss: Patient was recently seen in the office and started on donepezil 5 mg.  Plan was to increase to 10 mg after 1 month if the patient did not experience any significant side effects, over the patient discontinued the medication altogether because she did not feel that it was helping.  Moca was completed at her last visit 12/5 and noted to be 26/30 at that time with points taken off for serial subtraction, language (number of words that begin with the letter F), drawing a cube and delayed recall.  Patient reports that she continues to have concerns about her memory loss.  She feels that she is not acting herself wonders if any of her medications may be contributing to her symptoms.  She is not having symptoms of depression at this time. She is interested in referral to the geriatric clinic.  Monitoring Labs and Parameters Last A1C:  Lab Results  Component Value Date   HGBA1C 7.1 (A) 09/02/2018    Last Lipid:     Component Value Date/Time   CHOL 106 07/22/2016 1026   HDL 44 (L) 07/22/2016 1026   LDLDIRECT  08/14/2009 1225    58 (NOTE) ATP III Classification (LDL):      < 100        mg/dL         Optimal     100 -  129     mg/dL         Near or Above Optimal     130 - 159     mg/dL         Borderline High     160 - 189     mg/dL         High      > 190        mg/dL         Very  High     Last Bmet  Potassium  Date Value Ref Range Status  04/08/2018 4.0 3.5 - 5.1 mmol/L Final   Sodium  Date Value Ref Range Status  04/08/2018 134 (L) 135 - 145 mmol/L Final  11/26/2016 138 134 - 144 mmol/L Final   Creat  Date Value Ref Range Status  07/22/2016 0.46 (L) 0.50 - 0.99 mg/dL Final    Comment:      For patients > or = 69 years of age: The upper reference limit for Creatinine is approximately 13% higher for people identified as African-American.      Creatinine, Ser  Date Value Ref Range Status  04/08/2018 0.57 0.44 - 1.00 mg/dL Final      Last BPs:  BP Readings from Last 3 Encounters:  09/02/18 138/68  06/04/18 (!) 104/50  04/08/18 93/62    Review of Symptoms:  See HPI for ROS.   CC, SH/smoking status, and VS noted.  Objective: BP 138/68   Pulse 83   Temp 98.3 F (36.8 C) (Oral)   Ht 5\' 2"  (1.575 m)   Wt 149 lb 6.4 oz (67.8 kg)   SpO2 93%   BMI 27.33 kg/m  GEN: NAD, alert, cooperative, and pleasant. RESPIRATORY: CTA bil, no W/R/R CV: RRR, no m/r/g, distal extremities well perfused and warm without edema GI: Soft, nondistended SKIN: warm and dry, no rashes or lesions NEURO: II-XII grossly intact MSK: Moves 4 extremities equally PSYCH: AAOx3, appropriate affect   Assessment and plan:  Mild cognitive impairment Restart donepezil 5 mg daily.  Would consider increasing to 10 mg daily in 1 month if this continues to be well-tolerated.   - ambulatory referral to geri clinic  Diabetes mellitus without complication ([place]) Continue current management.  Goal A1c less than 7.5.  Encourage continued healthy diet and lifestyle.  Encounter for chronic pain management Percocet refilled x3 months.  Postdated scripts provided.  Patient reports today that she is concerned that her  provider is graduating because she is worried that her next physician will not continue her chronic pain medications.  Essential hypertension Normotensive at today's visit.  There are multiple hypotensive reads charted from 04/08/2018 and 06/04/2018.  Uncertain whether patient can tolerate an ACE or arm at this time.  Will consider adding this on at her next visit if she continues to be normotensive.   Orders Placed This Encounter  Procedures  . Ambulatory referral to Geriatrics    Referral Priority:   Routine    Referral Type:   Consultation    Referral Reason:   Specialty Services Required    Requested Specialty:   Geriatric Medicine    Number of Visits Requested:   1  . HgB A1c    Meds ordered this encounter  Medications  . DISCONTD: donepezil (ARICEPT) 5 MG tablet    Sig: Take 1 tablet (5 mg total) by mouth at bedtime.    Dispense:  30 tablet    Refill:  0  . DISCONTD: oxyCODONE-acetaminophen (PERCOCET) 10-325 MG tablet    Sig: Take 1 tablet by mouth every 6 (six) hours as needed for pain.    Dispense:  120 tablet    Refill:  0    Fill on or after 09/24/18  . DISCONTD: oxyCODONE-acetaminophen (PERCOCET) 10-325 MG tablet    Sig: Take 1 tablet by mouth every 6 (six) hours as needed for pain.    Dispense:  120 tablet    Refill:  0    Fill on or after 10/25/18  . oxyCODONE-acetaminophen (PERCOCET) 10-325 MG tablet    Sig: Take 1 tablet by mouth every 6 (six) hours as needed for pain.    Dispense:  120 tablet    Refill:  0    Fill on or after 11/24/18  . donepezil (ARICEPT) 5 MG tablet    Sig: Take 1 tablet (5 mg total) by mouth at bedtime.    Dispense:  30 tablet    Refill:  0     Everrett Coombe, MD,MS,  PGY3 09/04/2018 11:13 PM

## 2018-09-03 DIAGNOSIS — M059 Rheumatoid arthritis with rheumatoid factor, unspecified: Secondary | ICD-10-CM | POA: Diagnosis not present

## 2018-09-04 NOTE — Assessment & Plan Note (Addendum)
Continue current management.  Goal A1c less than 7.5.  Encourage continued healthy diet and lifestyle.

## 2018-09-04 NOTE — Assessment & Plan Note (Addendum)
Percocet refilled x3 months.  Postdated scripts provided.  Patient reports today that she is concerned that her provider is graduating because she is worried that her next physician will not continue her chronic pain medications.

## 2018-09-04 NOTE — Assessment & Plan Note (Signed)
Normotensive at today's visit.  There are multiple hypotensive reads charted from 04/08/2018 and 06/04/2018.  Uncertain whether patient can tolerate an ACE or arm at this time.  Will consider adding this on at her next visit if she continues to be normotensive.

## 2018-09-04 NOTE — Assessment & Plan Note (Signed)
Restart donepezil 5 mg daily.  Would consider increasing to 10 mg daily in 1 month if this continues to be well-tolerated.   - ambulatory referral to geri clinic

## 2018-09-09 DIAGNOSIS — Z6826 Body mass index (BMI) 26.0-26.9, adult: Secondary | ICD-10-CM | POA: Diagnosis not present

## 2018-09-09 DIAGNOSIS — I10 Essential (primary) hypertension: Secondary | ICD-10-CM | POA: Diagnosis not present

## 2018-09-09 DIAGNOSIS — M4316 Spondylolisthesis, lumbar region: Secondary | ICD-10-CM | POA: Diagnosis not present

## 2018-09-15 NOTE — Progress Notes (Signed)
Subjective:   Michelle [name] was seen in consultation in the movement disorder clinic at the request of Michelle Coombe, MD.  The evaluation is for tremor.  She is accompanied by her granddaughter who supplements the history.  Pt is a 68 y.o. female with hx of chronic pain, RA, anxiety, DM who presents with c/o tremor.  Tremor started approximately a year ago and involves the L hand.  She notes it when carrying something like a plate.  Pt was on prednisone for RA recently but tremor is independent of the prednisone.     Specific Symptoms:  Tremor: Yes.   Family hx of similar:  Yes.  , both mother and sister had tremor but no known dx Voice: "everyone says I talk too loud" Sleep: multiple awakenings to use the BR; she is sleepy during the day  Vivid Dreams:  No.  Acting out dreams:  rarely Wet Pillows: No. Postural symptoms:  Yes.    Falls?  No. but had near falls Bradykinesia symptoms: slow movements, drooling while awake and difficulty getting out of a chair Loss of smell:  No., better after septoplasty Loss of taste:  No. Urinary Incontinence:  Yes.  , urinary urgency and frequency Difficulty Swallowing:  No. Handwriting, micrographia: No. Trouble with ADL's:  No.  Trouble buttoning clothing: Yes.  , but because of RA Depression:  Yes.   Memory changes:  Yes.   Hallucinations:  No.  visual distortions: Yes.   N/V:  Yes.   (emesis with ice cream but attributes to GERD) Lightheaded:  Yes.    Syncope: No. Diplopia:  No. Dyskinesia:  No.  Neuroimaging has previously been performed.  It is available for my review today.  MRI done in 01/2013 Demonstrated scattered T2 hyperintensities, overall mild, and a developmental venous anomaly in the left frontal operculum.  05/28/17 update:  Pt seen in one year follow-up today. Tremor has been worse in the L hand and she is noting some chin/lip tremor.   I have reviewed records available to me since last visit.  She had a low B12 level done in  September. She has been following at [facility] for her cough variant asthma.  She states that she is worried about lung CA.    08/29/17 update: Patient is seen today in follow-up for essential tremor.  She is now on primidone, 50 mg daily.  She reports that tremor is good in the AM but wears off in the afternoon.  She will nearly throw it off of the plate.  Use of albuterol is variable but she doesn't think that it affects tremor.  She also c/o headache in the bilateral temporal region, pounding in quality.  Comes daily.  Better in the AM, worse in the afternoon.  Going on x 3 weeks.  No new medication in the last month.  Tried ibuprofen, tylenol, OTC "migraine" meds without relief.  Uses percocet every 6 hours from RA.  B12 was a little low and now on 1054mcg daily.  Records have been reviewed since our last visit.  She has been seen at [facility] rheumatology for rheumatoid arthritis.  She has been seen by family medicine.  Her last A1c in December, 2018 was 7.2.  02/03/18 update: Patient is seen today in follow-up for essential tremor.  Primidone was increased last visit to 50 mg twice per day.  She reports that she still has tremor but worse mid day.  She does think that the medication helps some.  Has  occasional jaw tremor.  Records have been reviewed since our last visit.  She has been recently seen for chronic pain, including hip and back pain.  Having some bladder issues.  Tried what sounds like mybetriq and it worked but was too expensive so she is trying something else that isn't helping.  [name] one week ago.  She relates it to hip issues/back issues and wonders if leg gave out.  She found that PT caused more pain.  She is having injections next week.  She is seeing Dr. Kathyrn [name] and Dr. Maryjean [name].    09/16/18 update: Patient is seen today in follow-up for essential tremor.  Primidone was increased last visit to 50 mg, 2 tablets in the morning and 1 at night but patient states that still only on 1 po bid.  Pt states that  she had gone up on it but "I cut it back because my memory is just gone."  She isn't sure it was from that but decided to cut back on that.  Also c/o balance loss.  Records are reviewed since last visit.  She had surgery in October for lumbar radiculopathy.  Patient has been seeing primary care at the family practice residency clinic.  She was started on donepezil.  The patient stopped this because she did not think it was beneficial.  It was restarted on September 04, 2018.  Moca was 26.  She is on narcotic medications (Percocet).  Had back sx in October and it helped but still has some pain/stiffness in the AM and told due to arthritis.     ALLERGIES:   Allergies  Allergen Reactions  . Zofran [Ondansetron Hcl] Other (See Comments)    Prolong QT- pt unsure of this allergy  . Ace Inhibitors Cough    Occurred with liinopril  . Levofloxacin Other (See Comments)    Felt things on her legs that were not there,made her stomach hurt  . Lisinopril Cough  . Sulfonamide Derivatives Hives and Itching    CURRENT MEDICATIONS:  Outpatient Encounter Medications as of 09/16/2018  Medication Sig  . albuterol (VENTOLIN HFA) 108 (90 Base) MCG/ACT inhaler INHALE TWO PUFFS BY MOUTH EVERY 4 HOURS AS NEEDED FOR WHEEZING FOR SHORTNESS OF BREATH  . aspirin EC 81 MG tablet Take 1 tablet (81 mg total) by mouth daily.  Marland Kitchen azelastine (OPTIVAR) 0.05 % ophthalmic solution Place 1 drop into both eyes 2 (two) times daily as needed (itchy eyes.).   Marland Kitchen cetirizine (ZYRTEC) 10 MG tablet Take 1 tablet (10 mg total) by mouth daily. (Patient taking differently: Take 10 mg by mouth at bedtime. )  . diclofenac sodium (VOLTAREN) 1 % GEL Apply 2 g topically 4 (four) times daily as needed (for pain).  Marland Kitchen donepezil (ARICEPT) 5 MG tablet Take 1 tablet (5 mg total) by mouth at bedtime.  . fluticasone (FLONASE) 50 MCG/ACT nasal spray Place 2 sprays into both nostrils daily. (Patient taking differently: Place 2 sprays into both nostrils daily as  needed for allergies. )  . glucose blood (ACCU-CHEK AVIVA) test strip Use to check blood sugar four times daily or as directed.  . hydrOXYzine (ATARAX/VISTARIL) 25 MG tablet Take 25 mg by mouth every 8 (eight) hours as needed for itching.   . inFLIXimab (REMICADE) 100 MG injection Inject 100 mg into the vein every 7 (seven) weeks.   Marland Kitchen leflunomide (ARAVA) 10 MG tablet Take 10 mg by mouth daily.  . metFORMIN (GLUCOPHAGE) 1000 MG tablet Take 1 tablet (1,000 mg total)  by mouth 2 (two) times daily with a meal.  . montelukast (SINGULAIR) 10 MG tablet Take 10 mg by mouth daily.  Marland Kitchen omeprazole (PRILOSEC) 40 MG capsule Take 1 capsule (40 mg total) by mouth 2 (two) times daily. (Patient taking differently: Take 40 mg by mouth daily. )  . ONGLYZA 5 MG TABS tablet Take 1 tablet (5 mg total) by mouth daily.  Marland Kitchen oxyCODONE-acetaminophen (PERCOCET) 10-325 MG tablet Take 1 tablet by mouth every 6 (six) hours as needed for pain.  . primidone (MYSOLINE) 50 MG tablet TAKE 2 TABLETS BY MOUTH ONCE DAILY IN THE MORNING AND 1 ONCE DAILY NIGHT (Patient taking differently: Take 50 mg by mouth 2 (two) times daily. )  . rosuvastatin (CRESTOR) 10 MG tablet Take 1 tablet (10 mg total) by mouth daily. (Patient taking differently: Take 10 mg by mouth at bedtime. )  . sertraline (ZOLOFT) 50 MG tablet Take 1 tablet (50 mg total) by mouth daily. (Patient taking differently: Take 50 mg by mouth at bedtime. )  . TRULICITY 2.69 SW/5.4OE SOPN INJECT 0.75MG  SUBCUTANEOUSLY EVERY TUESDAY  . nitroGLYCERIN (NITROSTAT) 0.4 MG SL tablet Place 1 tablet (0.4 mg total) under the tongue every 5 (five) minutes as needed. (Patient not taking: Reported on 09/16/2018)   No facility-administered encounter medications on file as of 09/16/2018.     PAST MEDICAL HISTORY:   Past Medical History:  Diagnosis Date  . Allergy   . Anxiety   . Asthma   . Cancer ([place])    skin CA  . Cataract    beginning on left eye  . Depression   . Diabetes mellitus     Type II  . Diverticulosis   . GERD (gastroesophageal reflux disease)   . HLD (hyperlipidemia)   . Hypertension   . Lung nodules   . Osteoporosis   . RA (rheumatoid arthritis) ([place])    CCP positive followed by [facility]    PAST SURGICAL HISTORY:   Past Surgical History:  Procedure Laterality Date  . ABDOMINAL HYSTERECTOMY    . APPENDECTOMY    . CARPAL TUNNEL RELEASE Right   . CATARACT EXTRACTION Left    with lid lift  . CHOLECYSTECTOMY    . COLONOSCOPY    . HERNIA REPAIR    . KNEE SURGERY Right    x2- cartilage annd scar tissue  . LEFT AND RIGHT HEART CATHETERIZATION WITH CORONARY ANGIOGRAM N/A 08/17/2013   Procedure: LEFT AND RIGHT HEART CATHETERIZATION WITH CORONARY ANGIOGRAM;  Surgeon: Laverda Page, MD;  Location[facility] CATH LAB;  Service: Cardiovascular;  Laterality: N/A;  . right foot bone spurs removed    . ROTATOR CUFF REPAIR Right   . SHOULDER SURGERY Left    x2- rotatar cuff tear  . UPPER GASTROINTESTINAL ENDOSCOPY      SOCIAL HISTORY:   Social History   Socioeconomic History  . Marital status: Married    Spouse name: Not on file  . Number of children: 3  . Years of education: Not on file  . Highest education level: Not on file  Occupational History  . Occupation: disabled    Fish farm manager: UNEMPLOYED  Social Needs  . Financial resource strain: Not on file  . Food insecurity:    Worry: Not on file    Inability: Not on file  . Transportation needs:    Medical: Not on file    Non-medical: Not on file  Tobacco Use  . Smoking status: Former Smoker    Packs/day: 1.50  Years: 5.00    Pack years: 7.50    Types: Cigarettes    Last attempt to quit: 05/31/1994    Years since quitting: 24.3  . Smokeless tobacco: Never Used  Substance and Sexual Activity  . Alcohol use: No    Alcohol/week: 0.0 standard drinks  . Drug use: No  . Sexual activity: Not on file  Lifestyle  . Physical activity:    Days per week: Not on file    Minutes per session: Not on file  .  Stress: Not on file  Relationships  . Social connections:    Talks on phone: Not on file    Gets together: Not on file    Attends religious service: Not on file    Active member of club or organization: Not on file    Attends meetings of clubs or organizations: Not on file    Relationship status: Not on file  . Intimate partner violence:    Fear of current or ex partner: Not on file    Emotionally abused: Not on file    Physically abused: Not on file    Forced sexual activity: Not on file  Other Topics Concern  . Not on file  Social History Narrative   Pt is on [employer] [place]   Lots of family stressors including grandaughter who lives with her          FAMILY HISTORY:   Family Status  Relation Name Status  . Father  Deceased  . Mat Aunt  Deceased  . Mother  Deceased  . Brother  (Not Specified)  . Sister  (Not Specified)  . Daughter  (Not Specified)  . Sister  (Not Specified)  . Sister  (Not Specified)  . Neg Hx  (Not Specified)    ROS:  Review of Systems  Constitutional:       Cold intolerance  HENT: Negative.   Gastrointestinal: Negative.   Genitourinary: Negative.   Skin: Negative.   Endo/Heme/Allergies: Negative.   Psychiatric/Behavioral: Positive for memory loss.     PHYSICAL EXAMINATION:    VITALS:   Vitals:   09/16/18 0816  BP: (!) 148/84  Pulse: 80  Temp: 97.9 F (36.6 C)  TempSrc: Oral  SpO2: 94%  Weight: 149 lb (67.6 kg)  Height: 5\' 3"  (1.6 m)    GEN:  The patient appears stated age and is in NAD. HEENT:  Normocephalic, atraumatic.  The mucous membranes are moist. The superficial temporal arteries are without ropiness or tenderness. CV:  RRR Lungs:  CTAB Neck/HEME:  There are no carotid bruits bilaterally.  Neurological examination:  Orientation: The patient is alert and oriented x3. Cranial nerves: There is good facial symmetry. The speech is fluent and clear. Soft palate rises symmetrically and there is no tongue deviation.  Hearing is intact to conversational tone. Sensation: Sensation is intact to light touch throughout Motor: Strength is 5/5 in the bilateral upper and lower extremities.   Shoulder shrug is equal and symmetric.  There is no pronator drift.  Movement examination: Tone: There is normal tone in the upper and lower extremities. Abnormal movements: There is no intention or rest tremor.  she has no difficulty with archimedes spirals. Coordination:  There is no decremation with RAM's, with any form of RAMS, including alternating supination and pronation of the forearm, hand opening and closing, finger taps, heel taps and toe taps. Gait and Station: The patient walks well down the hall.   LABS  Had lab work  at [facility] dated January 22, 2018.  White blood cells were 8.4, hemoglobin 12.7, hematocrit 39.9 and platelets 209.  AST was 28, ALT 22, BUN 13 and creatinine 0.52.  The remainder of the chemistry was not done on that date.  Sedimentation rate was 6.  Lab Results  Component Value Date   TSH 2.160 11/26/2016   Lab Results  Component Value Date   VITAMINB12 260 03/26/2017        Chemistry      Component Value Date/Time   NA 134 (L) 04/08/2018 0443   NA 138 11/26/2016 0943   K 4.0 04/08/2018 0443   CL 103 04/08/2018 0443   CO2 25 04/08/2018 0443   BUN 8 04/08/2018 0443   BUN 18 11/26/2016 0943   CREATININE 0.57 04/08/2018 0443   CREATININE 0.46 (L) 07/22/2016 1026      Component Value Date/Time   CALCIUM 8.7 (L) 04/08/2018 0443   ALKPHOS 88 11/26/2016 0943   AST 24 11/26/2016 0943   ALT 24 11/26/2016 0943   BILITOT 0.3 11/26/2016 0943     Lab Results  Component Value Date   TSH 2.160 11/26/2016      ASSESSMENT/PLAN:  1.   Essential Tremor.  -I told the patient that this likely represents asymmetric essential tremor, worsened by the medications for her lungs.  I had increased her primidone but she decreased it as thought it perhaps affected memory.  i'm not convinced but will  keep 50 mg bid.  2.  B12 deficiency  -on oral supplements but just restarted again last week (she stopped it and then restarted and only on 525mcg)  -recheck b12  3. Iron deficiency anemia  -Status post iron infusion  4.  Hip and low back pain  -s/p surgery in 04/2019  5. Memory loss  -Primary care residency started her on donepezil.  I do not think that she has dementia, but she has been referred to the geriatric clinic for further evaluation but eval was cx d/t pandemic.   I think that strong consideration needs to be made to reviewing her medication list, particularly narcotic medications.  She and I discussed this today  -will be recheck b12/TSH, folate, rpr  6.  Cold intolerance  -doubt of neuro origin.    -check TSH  7.  Follow up is anticipated in the next 6-8 months, sooner should new neurologic issues arise.     Cc:  Michelle Coombe, MD

## 2018-09-16 ENCOUNTER — Other Ambulatory Visit (INDEPENDENT_AMBULATORY_CARE_PROVIDER_SITE_OTHER): Payer: Medicare Other

## 2018-09-16 ENCOUNTER — Encounter

## 2018-09-16 ENCOUNTER — Ambulatory Visit (INDEPENDENT_AMBULATORY_CARE_PROVIDER_SITE_OTHER): Payer: Medicare Other | Admitting: Neurology

## 2018-09-16 ENCOUNTER — Encounter: Payer: Self-pay | Admitting: Neurology

## 2018-09-16 ENCOUNTER — Other Ambulatory Visit: Payer: Self-pay

## 2018-09-16 VITALS — BP 148/84 | HR 80 | Temp 97.9°F | Ht 63.0 in | Wt 149.0 lb

## 2018-09-16 DIAGNOSIS — R6889 Other general symptoms and signs: Secondary | ICD-10-CM

## 2018-09-16 DIAGNOSIS — E538 Deficiency of other specified B group vitamins: Secondary | ICD-10-CM | POA: Diagnosis not present

## 2018-09-16 DIAGNOSIS — R413 Other amnesia: Secondary | ICD-10-CM

## 2018-09-16 DIAGNOSIS — R251 Tremor, unspecified: Secondary | ICD-10-CM

## 2018-09-16 DIAGNOSIS — Z5181 Encounter for therapeutic drug level monitoring: Secondary | ICD-10-CM

## 2018-09-16 NOTE — Patient Instructions (Signed)
1. Your provider has requested that you have labwork completed today. Please go to [facility] ([id number]) on the second floor of this building before leaving the office today. You do not need to check in. If you are not called within 15 minutes please check with the front desk.   

## 2018-09-17 ENCOUNTER — Telehealth: Payer: Self-pay | Admitting: Neurology

## 2018-09-17 ENCOUNTER — Ambulatory Visit: Payer: Medicare Other

## 2018-09-17 LAB — VITAMIN B12: Vitamin B-12: 394 pg/mL (ref 200–1100)

## 2018-09-17 LAB — FOLATE: Folate: 11.8 ng/mL

## 2018-09-17 LAB — TSH: TSH: 2.17 mIU/L (ref 0.40–4.50)

## 2018-09-17 LAB — RPR: RPR: NONREACTIVE

## 2018-09-17 NOTE — Telephone Encounter (Signed)
-----   Message from [place], DO sent at 09/17/2018  7:55 AM EDT ----- Let pt know that B12 slightly low.  Take b12 1023mcg daily

## 2018-09-17 NOTE — Telephone Encounter (Signed)
Mychart message sent to patient.

## 2018-09-21 ENCOUNTER — Telehealth: Payer: Self-pay | Admitting: Neurology

## 2018-09-21 NOTE — Telephone Encounter (Signed)
Patient is calling about recent blood work results. Thanks!

## 2018-09-21 NOTE — Telephone Encounter (Signed)
Spoke with patient and made her aware lab results were sent via mychart.  B12 level a little low. Recommend b12 supplement 1000 mcg daily. Patient expressed understanding.

## 2018-09-22 ENCOUNTER — Other Ambulatory Visit: Payer: Self-pay | Admitting: Student in an Organized Health Care Education/Training Program

## 2018-09-22 DIAGNOSIS — E119 Type 2 diabetes mellitus without complications: Secondary | ICD-10-CM

## 2018-10-01 ENCOUNTER — Telehealth: Payer: Self-pay | Admitting: Student in an Organized Health Care Education/Training Program

## 2018-10-01 ENCOUNTER — Other Ambulatory Visit: Payer: Self-pay | Admitting: Student in an Organized Health Care Education/Training Program

## 2018-10-01 DIAGNOSIS — Z79899 Other long term (current) drug therapy: Secondary | ICD-10-CM | POA: Diagnosis not present

## 2018-10-01 DIAGNOSIS — M059 Rheumatoid arthritis with rheumatoid factor, unspecified: Secondary | ICD-10-CM | POA: Diagnosis not present

## 2018-10-01 DIAGNOSIS — M5431 Sciatica, right side: Secondary | ICD-10-CM | POA: Diagnosis not present

## 2018-10-01 NOTE — Telephone Encounter (Signed)
Patient called stating that her donepezil is working well for her and she'll like a refill. Please give patient a call back.

## 2018-10-02 NOTE — Telephone Encounter (Signed)
Donepezil sent to pharmacy

## 2018-10-03 ENCOUNTER — Other Ambulatory Visit: Payer: Self-pay | Admitting: Student in an Organized Health Care Education/Training Program

## 2018-10-06 ENCOUNTER — Other Ambulatory Visit: Payer: Self-pay | Admitting: Student in an Organized Health Care Education/Training Program

## 2018-10-06 ENCOUNTER — Other Ambulatory Visit: Payer: Self-pay | Admitting: Neurology

## 2018-10-06 DIAGNOSIS — E119 Type 2 diabetes mellitus without complications: Secondary | ICD-10-CM

## 2018-10-07 ENCOUNTER — Telehealth: Payer: Self-pay

## 2018-10-07 ENCOUNTER — Other Ambulatory Visit: Payer: Self-pay

## 2018-10-07 DIAGNOSIS — J45909 Unspecified asthma, uncomplicated: Secondary | ICD-10-CM

## 2018-10-07 MED ORDER — ALBUTEROL SULFATE HFA 108 (90 BASE) MCG/ACT IN AERS: INHALATION_SPRAY | RESPIRATORY_TRACT | 1 refills | Status: DC

## 2018-10-07 NOTE — Telephone Encounter (Signed)
Fax received from pharmacy stating patients insurance will no longer cover ACCU-CHEK AVIVA PLUS test strips. Please send in prescription for ACCU CHEK GUIDE.

## 2018-10-08 ENCOUNTER — Other Ambulatory Visit: Payer: Self-pay | Admitting: Student in an Organized Health Care Education/Training Program

## 2018-10-08 MED ORDER — ACCU-CHEK GUIDE W/DEVICE KIT: 1.0000 | PACK | Freq: Once | 0 refills | Status: AC

## 2018-10-08 NOTE — Telephone Encounter (Signed)
Device kit sent to pharmacy. 

## 2018-10-12 ENCOUNTER — Other Ambulatory Visit: Payer: Self-pay | Admitting: Student in an Organized Health Care Education/Training Program

## 2018-10-12 NOTE — Telephone Encounter (Signed)
Pt would like a refill on her oral rinse: Chlorhexidine Gluconate 0.12% (big bottle/pint size). Her [facility] is [facility] [address].

## 2018-10-16 ENCOUNTER — Other Ambulatory Visit: Payer: Self-pay

## 2018-10-16 ENCOUNTER — Telehealth (INDEPENDENT_AMBULATORY_CARE_PROVIDER_SITE_OTHER): Payer: Medicare Other | Admitting: Family Medicine

## 2018-10-16 ENCOUNTER — Other Ambulatory Visit: Payer: Self-pay | Admitting: Student in an Organized Health Care Education/Training Program

## 2018-10-16 DIAGNOSIS — K0530 Chronic periodontitis, unspecified: Secondary | ICD-10-CM | POA: Diagnosis not present

## 2018-10-16 MED ORDER — CHLORHEXIDINE GLUCONATE 0.12 % MT SOLN: OROMUCOSAL | 3 refills | Status: DC

## 2018-10-16 NOTE — Telephone Encounter (Signed)
I do not see where this medicine was previously prescribed at our practice

## 2018-10-16 NOTE — Telephone Encounter (Signed)
Informed pt that oral rinse wasn't prescribed by our practice. She states that she doesn't remember who prescribed it. Says her mouth is really sore but doesn't want to come in for an appt. Offered a telemedicine appt. appt scheduled. Deseree Kennon Holter, CMA

## 2018-10-16 NOTE — Progress Notes (Signed)
[place] Telemedicine Visit  Patient consented to have virtual visit. Method of visit: Telephone Patient does not have smartphone or use e-mail   Encounter participants: Patient: Michelle [name] - located at hoem Provider: Martyn Malay - located at [facility] Others (if applicable): NA   Chief Complaint: mouth issue   HPI:  Michelle [name] is a pleasant 68 year old woman with history of type 2 diabetes, rheumatoid arthritis on chronic prednisone and cognitive impairment presenting via virtual visit for mouth problem.  The patient declines a video visit as she does not have a smart phone and does not like to use email.  She would like to stay at home as much as possible.  The patient reports that she has on and off problems with mouth pain.  Specifically she describes this as an irritated mucosal layer in her mouth.  This typically occurs when she has a flare of her rheumatoid arthritis and takes prednisone for it.  She specifically denies fevers, difficulty swallowing, pain with swallowing, difficulty with chewing, oral lesions, oral ulcers or white plaques on her bugle mucosa or tongue.  She reports she has had this problem previously and has used Peridex with success.  Her dentist has told her she may have a inflammatory component to her peridental disease.    ROS: per HPI  Pertinent PMHx:  RA- on chronic prednisone and TNF inhibitor  Type 2 diabetes Mild cognitive impairment  Exam:  Respiratory: Speaking in full sentences.  No audible increased work of breathing.  Assessment/Plan:  Diagnoses and all orders for this visit:  Periodontitis, recommended using either video or in person to assist in diagnosis.  The patient declines both.  We discussed the limitations of this visit without images/visualization.  Does not sound like mucositis or oral leukoplakia or oral candidiasis in the setting of her steroid use.  This may represent burning mouth syndrome or  Sjogren's syndrome which could be associated with her rheumatoid arthritis.  She does report a history of periodontal disease that when it flares causes this as well.  Refilled medication per her request all questions were answered reviewed reasons to call and return to care -     chlorhexidine (PERIDEX) 0.12 % solution; Swish for 30 seconds with 15 mL (one capful) of undiluted oral rinse after toothbrushing, then expectorate; repeat twice daily   Time spent during visit with patient: 13 minutes

## 2018-10-22 ENCOUNTER — Telehealth (INDEPENDENT_AMBULATORY_CARE_PROVIDER_SITE_OTHER): Payer: Medicare Other | Admitting: Family Medicine

## 2018-10-22 ENCOUNTER — Telehealth: Payer: Self-pay | Admitting: Student in an Organized Health Care Education/Training Program

## 2018-10-22 ENCOUNTER — Other Ambulatory Visit: Payer: Self-pay

## 2018-10-22 DIAGNOSIS — G479 Sleep disorder, unspecified: Secondary | ICD-10-CM | POA: Diagnosis not present

## 2018-10-22 DIAGNOSIS — Z79899 Other long term (current) drug therapy: Secondary | ICD-10-CM

## 2018-10-22 DIAGNOSIS — G3184 Mild cognitive impairment, so stated: Secondary | ICD-10-CM

## 2018-10-22 NOTE — Progress Notes (Signed)
[place] Telemedicine Visit  Patient consented to have virtual visit. Method of visit: Telephone  Encounter participants: Patient: Michelle [name] - located at home Provider: Annabell Sabal - located at office Others (if applicable): n/a   Chief Complaint: wants to stop medicine  HPI:  Patient has been having memory issues over the past several months.  She was started on Aricept 5 mg February March of this year.  She stopped this for a month that she was not experiencing any improvement.  At the beginning of this month she was started on to milligrams.  She states that she is taking at nighttime.  She has vivid dreams and inability to sleep since starting the medicine.  She has been on it since beginning of April now for about 3 weeks.  She is very tired because of difficulty sleeping.  She would like to stop the medicine.  She has not noticed any improvement in her memory.  She is also looking forward to her geriatrics appointment.  However this is been pushed off due to coronavirus concerns.    ROS: per HPI  Pertinent PMHx: History of mild cognitive impairment  Exam: Gen:  Patient awake, alert, fully conversant, oriented x 4 Auditory:  Hearing normal Resp:  Good strong voice.  Speaking in full sentences.  No coughing during exam.  No respiratory distress.  No audible wheezing over the phone  Psych:  Linear and coherent thought process.  No flight of ideas.    Assessment/Plan:  1.  Difficulty sleeping: -Okay to stop Aricept. -She should remain off this medicine. -It was not helping her memory.  Defer to PCP to see if patient would like to try anything else.  2.  Polypharmacy: -I will have pharmacy team give patient a call to see if they can discuss polypharmacy over the phone.  Patient states she would like to see how many of the medications she is on she can stop.  Time spent on phone with patient: 8 minutes

## 2018-10-29 DIAGNOSIS — M059 Rheumatoid arthritis with rheumatoid factor, unspecified: Secondary | ICD-10-CM | POA: Diagnosis not present

## 2018-11-12 ENCOUNTER — Other Ambulatory Visit: Payer: Self-pay | Admitting: Student in an Organized Health Care Education/Training Program

## 2018-11-12 DIAGNOSIS — E119 Type 2 diabetes mellitus without complications: Secondary | ICD-10-CM

## 2018-11-19 ENCOUNTER — Ambulatory Visit: Payer: Medicare Other | Admitting: Neurology

## 2018-12-02 ENCOUNTER — Ambulatory Visit (INDEPENDENT_AMBULATORY_CARE_PROVIDER_SITE_OTHER): Payer: Medicare Other | Admitting: Student in an Organized Health Care Education/Training Program

## 2018-12-02 ENCOUNTER — Other Ambulatory Visit: Payer: Self-pay

## 2018-12-02 ENCOUNTER — Encounter: Payer: Self-pay | Admitting: Student in an Organized Health Care Education/Training Program

## 2018-12-02 VITALS — BP 118/64 | Wt 152.0 lb

## 2018-12-02 DIAGNOSIS — G8929 Other chronic pain: Secondary | ICD-10-CM | POA: Diagnosis not present

## 2018-12-02 DIAGNOSIS — B351 Tinea unguium: Secondary | ICD-10-CM | POA: Diagnosis not present

## 2018-12-02 DIAGNOSIS — Z79899 Other long term (current) drug therapy: Secondary | ICD-10-CM | POA: Diagnosis not present

## 2018-12-02 DIAGNOSIS — E119 Type 2 diabetes mellitus without complications: Secondary | ICD-10-CM | POA: Diagnosis not present

## 2018-12-02 DIAGNOSIS — G894 Chronic pain syndrome: Secondary | ICD-10-CM

## 2018-12-02 DIAGNOSIS — K5900 Constipation, unspecified: Secondary | ICD-10-CM

## 2018-12-02 LAB — POCT GLYCOSYLATED HEMOGLOBIN (HGB A1C): HbA1c, POC (controlled diabetic range): 7.6 % — AB (ref 0.0–7.0)

## 2018-12-02 MED ORDER — ONGLYZA 5 MG PO TABS: 5.0000 mg | ORAL_TABLET | Freq: Every day | ORAL | 3 refills | Status: DC

## 2018-12-02 MED ORDER — DULAGLUTIDE 0.75 MG/0.5ML ~~LOC~~ SOAJ: 1.5000 mg | SUBCUTANEOUS | 2 refills | Status: DC

## 2018-12-02 MED ORDER — OXYCODONE-ACETAMINOPHEN 10-325 MG PO TABS: 1.0000 | ORAL_TABLET | Freq: Four times a day (QID) | ORAL | 0 refills | Status: DC | PRN

## 2018-12-02 MED ORDER — FLUTICASONE PROPIONATE 50 MCG/ACT NA SUSP: 2.0000 | Freq: Every day | NASAL | 3 refills | Status: DC | PRN

## 2018-12-02 MED ORDER — METFORMIN HCL 1000 MG PO TABS: 1000.0000 mg | ORAL_TABLET | Freq: Two times a day (BID) | ORAL | 3 refills | Status: DC

## 2018-12-02 NOTE — Assessment & Plan Note (Signed)
Patient may use OTC antifungal medication daily on her fingernail.

## 2018-12-02 NOTE — Assessment & Plan Note (Signed)
Patient associates with iron pills. Increase miralax to BID to titrate to 1-2 soft stools per day.

## 2018-12-02 NOTE — Progress Notes (Signed)
Subjective:    Michelle [name] - 68 y.o. female MRN [id number]  Date of birth: May 29, 1951  HPI  Michelle [name] is here for diabetes and pain medication. Additional concerns include a fungal nail infection and polypharmacy.  DIABETES Disease Monitoring: Previous A1c was 7.1, today 7.6 Blood Sugar ranges- This AM her CBG was 161 today which was high for her. She does not have a glucose log. Her lowest CBG has been 114.  Polyuria/phagia/dipsia- Denies   Visual problems- denies Medications: onglyza 5 mg daily, trulicity 0.73 mg qweekly Compliance- 100% reported  Hypoglycemic symptoms- denies Patient ison ASA. Diet/lifestyle Goal: avoid klondike bars, try to have fresh fruit. Has been going to a lot of parties lately.  Constipation - patient notes decreased BM since taking iron pills. She takes miralax 1 capful daily. No abdominal pain, no nausea. No hematochezia or melena.    onchomycosis - patient mentions at the end of the visit that her finger nail appears odd to her. She has not had any pain, swelling, redness or warmth. No drainage. She is concerned the nail may need to be removed.  Polypharmacy - patient previously referred to geri clinic prior to [place]. [place] clinic is open again and so she was put on the schedule for polypharmacy and PT eval. See plan below.  Monitoring Labs and Parameters Last A1C:  Lab Results  Component Value Date   HGBA1C 7.6 (A) 12/02/2018    Last Lipid:     Component Value Date/Time   CHOL 106 07/22/2016 1026   HDL 44 (L) 07/22/2016 1026   LDLDIRECT  08/14/2009 1225    58 (NOTE) ATP III Classification (LDL):      < 100        mg/dL         Optimal     100 - 129     mg/dL         Near or Above Optimal     130 - 159     mg/dL         Borderline High     160 - 189     mg/dL         High      > 190        mg/dL         Very  High     Last Bmet  Potassium  Date Value Ref Range Status  04/08/2018 4.0 3.5 - 5.1 mmol/L Final   Sodium    Date Value Ref Range Status  04/08/2018 134 (L) 135 - 145 mmol/L Final  11/26/2016 138 134 - 144 mmol/L Final   Creat  Date Value Ref Range Status  07/22/2016 0.46 (L) 0.50 - 0.99 mg/dL Final    Comment:      For patients > or = 69 years of age: The upper reference limit for Creatinine is approximately 13% higher for people identified as African-American.      Creatinine, Ser  Date Value Ref Range Status  04/08/2018 0.57 0.44 - 1.00 mg/dL Final      Last BPs:  BP Readings from Last 3 Encounters:  12/02/18 118/64  09/16/18 (!) 148/84  09/02/18 138/68      Health Maintenance:  -  Health Maintenance Due  Topic Date Due   FOOT EXAM  12/27/2017   OPHTHALMOLOGY EXAM  11/14/2018    -  reports that she quit smoking about 24 years ago. Her smoking use included cigarettes. She  has a 7.50 pack-year smoking history. She has never used smokeless tobacco. - Review of Systems: Per HPI. - Past Medical History: Patient Active Problem List   Diagnosis Date Noted   Onychomycosis 12/02/2018   Polypharmacy 12/02/2018   Lumbar radiculopathy 04/07/2018   Constipation 03/03/2018   Urge incontinence of urine 04/18/2017   Hearing loss 07/19/2015   Squamous cell carcinoma in situ of skin 11/25/2014   Encounter for chronic pain management 08/05/2014   Prolonged Q-T interval on ECG 07/05/2014   Mild cognitive impairment 07/05/2014   Pulmonary nodules 03/02/2014   Essential hypertension 10/30/2012   Irritable bowel 07/13/2012   Restless legs 05/14/2012   Gastroparesis 11/19/2011   Osteoporosis, unspecified 08/08/2011   Restrictive lung disease 08/09/2010   Asthma, chronic 03/14/2010   Rheumatoid arthritis ([place]) 01/08/2010   GERD 08/22/2008   Diabetes mellitus without complication ([place]) 67/34/1937   Hyperlipidemia 08/28/2006   OBESITY, NOS 08/28/2006   Anxiety and depression 08/28/2006   - Medications: reviewed and updated Current Outpatient  Medications  Medication Sig Dispense Refill   albuterol (VENTOLIN HFA) 108 (90 Base) MCG/ACT inhaler INHALE TWO PUFFS BY MOUTH EVERY 4 HOURS AS NEEDED FOR WHEEZING FOR SHORTNESS OF BREATH 6 Inhaler 1   aspirin EC 81 MG tablet Take 1 tablet (81 mg total) by mouth daily. 1 tablet    azelastine (OPTIVAR) 0.05 % ophthalmic solution Place 1 drop into both eyes 2 (two) times daily as needed (itchy eyes.).      cetirizine (EQ ALLERGY RELIEF, CETIRIZINE,) 10 MG tablet Take 1 tablet (10 mg total) by mouth at bedtime. 90 tablet 0   chlorhexidine (PERIDEX) 0.12 % solution Swish for 30 seconds with 15 mL (one capful) of undiluted oral rinse after toothbrushing, then expectorate; repeat twice daily 473 mL 3   diclofenac sodium (VOLTAREN) 1 % GEL Apply 2 g topically 4 (four) times daily as needed (for pain).     Dulaglutide (TRULICITY) 9.02 IO/9.7DZ SOPN Inject 1.5 mg into the skin once a week. 4 mL 2   fluticasone (FLONASE) 50 MCG/ACT nasal spray Place 2 sprays into both nostrils daily as needed for allergies. 48 g 3   gabapentin (NEURONTIN) 300 MG capsule Take 1 capsule by mouth at bedtime 90 capsule 0   glucose blood (ACCU-CHEK AVIVA) test strip Use to check blood sugar four times daily or as directed. 100 each 12   hydrOXYzine (ATARAX/VISTARIL) 25 MG tablet Take 25 mg by mouth every 8 (eight) hours as needed for itching.      inFLIXimab (REMICADE) 100 MG injection Inject 100 mg into the vein every 7 (seven) weeks.      leflunomide (ARAVA) 10 MG tablet Take 10 mg by mouth daily.     metFORMIN (GLUCOPHAGE) 1000 MG tablet Take 1 tablet (1,000 mg total) by mouth 2 (two) times daily with a meal. 180 tablet 3   montelukast (SINGULAIR) 10 MG tablet Take 10 mg by mouth daily.  2   nitroGLYCERIN (NITROSTAT) 0.4 MG SL tablet Place 1 tablet (0.4 mg total) under the tongue every 5 (five) minutes as needed. (Patient not taking: Reported on 09/16/2018) 30 tablet 0   omeprazole (PRILOSEC) 40 MG capsule Take  1 capsule (40 mg total) by mouth 2 (two) times daily. (Patient taking differently: Take 40 mg by mouth daily. ) 180 capsule 3   ONGLYZA 5 MG TABS tablet Take 1 tablet (5 mg total) by mouth daily. 90 tablet 3   oxyCODONE-acetaminophen (PERCOCET) 10-325 MG tablet Take 1  tablet by mouth every 6 (six) hours as needed for pain. 120 tablet 0   primidone (MYSOLINE) 50 MG tablet TAKE 2 TABLETS BY MOUTH ONCE DAILY IN THE MORNING AND 1 IN THE EVENING 270 tablet 0   rosuvastatin (CRESTOR) 10 MG tablet Take 1 tablet (10 mg total) by mouth daily. (Patient taking differently: Take 10 mg by mouth at bedtime. ) 90 tablet 3   sertraline (ZOLOFT) 50 MG tablet Take 1 tablet (50 mg total) by mouth daily. (Patient taking differently: Take 50 mg by mouth at bedtime. ) 90 tablet 3   No current facility-administered medications for this visit.     Review of Systems See HPI     Objective:   Physical Exam BP 118/64    Wt 152 lb (68.9 kg)    BMI 26.93 kg/m  Gen: NAD, alert, cooperative with exam, well-appearing  HEENT: NCAT, PERRL, clear conjunctiva, oropharynx clear, supple neck CV: RRR, good S1/S2, no murmur, no edema, capillary refill brisk  Resp: CTABL, no wheezes, non-labored Abd: SNTND, BS present, no guarding or organomegaly Skin: no rashes, normal turgor  Neuro: no gross deficits.  Psych: good insight, alert and oriented     Assessment & Plan:   Diabetes mellitus without complication (HCC) NLG9Q 7.6 from 7.1. - inc trulicity to 1.5 mg Lurlean Leyden (previously on 0.75 mg) - continue onglyza and metformin, refilled today - continue checking sugars at home - no episodes of hypoglycemia - specific goals: stop having klondike bars. Substitute fruit such as berries as dessert.  Constipation Patient associates with iron pills. -  Increase miralax to BID to titrate to 1-2 soft stools per day. - Inc fiber intake.  - Inc water intake.  Encounter for chronic pain management Refilled x3 months. Percocet  10-325 q6H PRN #120 pills per month. Indication for chronic opioid: Rheumatoid arthritis.  Onychomycosis Patient may use OTC antifungal medication daily on her fingernail.  Polypharmacy Patient referred to geri clinic for polypharmacy. She additionally complains of memory difficulties and occasional balance issues. I previously completed a MOCA in 05/2018 and she scored 24. We started aricept which she did not tolerate and subsequently discontinued. She will benefit from interdisciplinary geriatric team appointment with pharmacy and PT.   Orders Placed This Encounter  Procedures   HgB A1c   Meds ordered this encounter  Medications   metFORMIN (GLUCOPHAGE) 1000 MG tablet    Sig: Take 1 tablet (1,000 mg total) by mouth 2 (two) times daily with a meal.    Dispense:  180 tablet    Refill:  3   fluticasone (FLONASE) 50 MCG/ACT nasal spray    Sig: Place 2 sprays into both nostrils daily as needed for allergies.    Dispense:  48 g    Refill:  3   ONGLYZA 5 MG TABS tablet    Sig: Take 1 tablet (5 mg total) by mouth daily.    Dispense:  90 tablet    Refill:  3   Dulaglutide (TRULICITY) 1.19 ER/7.4YC SOPN    Sig: Inject 1.5 mg into the skin once a week.    Dispense:  4 mL    Refill:  2   DISCONTD: oxyCODONE-acetaminophen (PERCOCET) 10-325 MG tablet    Sig: Take 1 tablet by mouth every 6 (six) hours as needed for pain.    Dispense:  120 tablet    Refill:  0    Fill on or after 12/02/2018   DISCONTD: oxyCODONE-acetaminophen (PERCOCET) 10-325 MG tablet  Sig: Take 1 tablet by mouth every 6 (six) hours as needed for pain.    Dispense:  120 tablet    Refill:  0    Fill on or after 01/01/2019   oxyCODONE-acetaminophen (PERCOCET) 10-325 MG tablet    Sig: Take 1 tablet by mouth every 6 (six) hours as needed for pain.    Dispense:  120 tablet    Refill:  0    Fill on or after 02/01/2019    Everrett Coombe, MD,MS,  PGY3 12/04/2018 11:59 AM

## 2018-12-02 NOTE — Patient Instructions (Addendum)
It was a pleasure seeing you today in our clinic. Here is the treatment plan we have discussed and agreed upon together:  We increased your Trulicity dose. Please continue to check your blood sugars daily. Please focus on limiting sugary desserts.   Your hemoglobin A1c was 7.6 today.  For your nail infection, please obtain an over-the-counter antifungal nail topical and apply daily.  You were given 3 months of pain medications at today's visit.  Our clinic's number is [phone]. Please call with questions or concerns about what we discussed today.  Be well, Dr. Burr Medico

## 2018-12-02 NOTE — Assessment & Plan Note (Signed)
Patient referred to geri clinic for polypharmacy. She additionally complains of memory difficulties and occasional balance issues. I previously completed a MOCA in 05/2018 and she scored 24. We started aricept which she did not tolerate and subsequently discontinued. She will benefit from interdisciplinary geriatric team appointment with pharmacy and PT.

## 2018-12-02 NOTE — Assessment & Plan Note (Signed)
HbA1c 7.6 from 7.1. - inc trulicity to 1.5 mg Michelle [name] (previously on 0.75 mg) - continue onglyza and metformin, refilled today - continue checking sugars at home - no episodes of hypoglycemia - specific goals: stop having klondike bars. Substitute fruit such as berries as dessert.

## 2018-12-02 NOTE — Assessment & Plan Note (Signed)
Refilled x3 months. Percocet 10-325 q6H PRN #120 pills per month. Indication for chronic opioid: Rheumatoid arthritis.

## 2018-12-03 ENCOUNTER — Telehealth: Payer: Self-pay

## 2018-12-03 NOTE — Telephone Encounter (Signed)
Called the pharmacist and confirmed that the patient should take 1.5 mg once weekly.

## 2018-12-03 NOTE — Telephone Encounter (Signed)
Mary with [facility] calling to verify Rx for Trulicity. Rx is for 0.75 but directions say 1.5 weekly. Please call to verify Rx. [phone]. Ottis Stain, CMA

## 2018-12-04 ENCOUNTER — Encounter: Payer: Self-pay | Admitting: Student in an Organized Health Care Education/Training Program

## 2018-12-10 ENCOUNTER — Other Ambulatory Visit: Payer: Self-pay

## 2018-12-10 ENCOUNTER — Encounter: Payer: Self-pay | Admitting: Family Medicine

## 2018-12-10 ENCOUNTER — Encounter: Payer: Self-pay | Admitting: Licensed Clinical Social Worker

## 2018-12-10 ENCOUNTER — Ambulatory Visit (INDEPENDENT_AMBULATORY_CARE_PROVIDER_SITE_OTHER): Payer: Medicare Other | Admitting: Licensed Clinical Social Worker

## 2018-12-10 ENCOUNTER — Ambulatory Visit (INDEPENDENT_AMBULATORY_CARE_PROVIDER_SITE_OTHER): Payer: Medicare Other | Admitting: Family Medicine

## 2018-12-10 VITALS — BP 128/68 | HR 108

## 2018-12-10 DIAGNOSIS — M5416 Radiculopathy, lumbar region: Secondary | ICD-10-CM

## 2018-12-10 DIAGNOSIS — K219 Gastro-esophageal reflux disease without esophagitis: Secondary | ICD-10-CM

## 2018-12-10 DIAGNOSIS — M810 Age-related osteoporosis without current pathological fracture: Secondary | ICD-10-CM | POA: Diagnosis not present

## 2018-12-10 DIAGNOSIS — Z23 Encounter for immunization: Secondary | ICD-10-CM | POA: Diagnosis not present

## 2018-12-10 DIAGNOSIS — E119 Type 2 diabetes mellitus without complications: Secondary | ICD-10-CM

## 2018-12-10 DIAGNOSIS — N3941 Urge incontinence: Secondary | ICD-10-CM | POA: Diagnosis not present

## 2018-12-10 DIAGNOSIS — M62838 Other muscle spasm: Secondary | ICD-10-CM

## 2018-12-10 DIAGNOSIS — Z139 Encounter for screening, unspecified: Secondary | ICD-10-CM

## 2018-12-10 DIAGNOSIS — Z79899 Other long term (current) drug therapy: Secondary | ICD-10-CM

## 2018-12-10 DIAGNOSIS — R2689 Other abnormalities of gait and mobility: Secondary | ICD-10-CM | POA: Diagnosis not present

## 2018-12-10 DIAGNOSIS — G3184 Mild cognitive impairment, so stated: Secondary | ICD-10-CM | POA: Diagnosis not present

## 2018-12-10 DIAGNOSIS — J309 Allergic rhinitis, unspecified: Secondary | ICD-10-CM

## 2018-12-10 DIAGNOSIS — Z7189 Other specified counseling: Secondary | ICD-10-CM

## 2018-12-10 DIAGNOSIS — L853 Xerosis cutis: Secondary | ICD-10-CM | POA: Diagnosis not present

## 2018-12-10 DIAGNOSIS — G2581 Restless legs syndrome: Secondary | ICD-10-CM | POA: Diagnosis not present

## 2018-12-10 NOTE — Patient Instructions (Addendum)
Michelle [name] - Here are the recommendations from your visit to the [facility].  The recommendations are from the team of doctors and pharmacists at the clinic.   Diabetes Stop the Onglyza (saxagliptin) - It does not add much blood sugar control once you start Trulicity (dulaglutide) injections.   Allergies  You medication list included a medication called Singulair (montelukast).  Since you are taking cetirizine (Zyrtec) to control your allergies, we took this medication off your medication list.   Urinary Incontinence Since the medication Solifenacin (Vesicare) was not helping your incontinence, we recommend you stop it as it can interfere with memory and thinking.  You can discuss other options with your primary care doctor, urologist or gynocologist.   Geriatric doctors try to avoid medications for urinary incontinence if we can, so we usually start with pelvic muscle rehabilitation by  Physical Therapists trained to help women regain control of their bladder.  You can discuss a referral to a pelvic rehabilitation specialist with your primary care doctor.   Itching skin Most of the medications taken by mouth for itching make people sleepy and are really bad for impairing memory and thinking of older adults.  As we get older, our skin does not make as much natural oils as when we were younger.  This makes our skin dry and itchy.  Keeping the skin moisturized by using a moisturizing cream many times a day is the best treatment for itching skin. Moisturizing creams are better than lotions because it is better at capturing water in your skin.   We sent in a prescription for a special cream than contains a topical medication that dulls the feeling of itching.  The medicated cream contains "pramoxine",  If the pharmacist does not have the pramoxine cream behind the counter, ask them for the over-the-counter pramoxine.  Use it as often as you need for itching skin areas.    Try to avoid  scratching because that causes skin damage that just leads to more itching.  Use your creams to it with. This will help avoid damaging your skin.   Muscle Spasms The medication cyclobenzaprine (Flexeril) interferes with memory and thinking in older adults.  We recommend stopping it.  We sent in a prescription for a muscle relaxer call Baclofen (lioresal) that is safe to use for muscle spasms without the bad effects on memory.    Back pain Over-the-Counter Lidocaine patches are available to use for your back pain when it flares up.     Mild Cognitive Impairement Mild neurocognitive disorder (formerly known as mild cognitive impairment) is a disorder in which memory does not work as well as it should. This disorder may also affect other mental functions, including thought, communication, behavior, and completion of tasks. These impairments are noticeable and measurable, but for the most part they do not interfere with daily activities or the ability to live independently. Mild neurocognitive disorder typically occurs in people older than 60 years but can occur earlier. It is not as serious as major neurocognitive disorder (formerly known as dementia), but it may be the first sign of it. Generally, symptoms of this condition get worse over time. In rare cases, symptoms can get better. What are the causes? This condition may be caused by:  Brain disorders associated with abnormal protein deposits in the brain, such as: ? Alzheimer disease. ? Frontotemporal dementia. ? Dementia with Lewy bodies.  Brain disorders associated with abnormal movement, such as Parkinson disease or Huntington disease.  Diseases  that affect blood vessels in the brain and result in small strokes.  Certain infections, such as HIV.  Traumatic brain injury.  Other medical conditions, such as brain tumors, under-active thyroid (hypothyroidism), and vitamin B12 deficiency.  Use of certain drugs or prescription  medicines.  Untreated sleep apnea.  Heart disease, lung disease, liver disease, or kidney disease. What are the signs or symptoms? Symptoms of this condition include:  Difficulty remembering. You may: ? Forget details of recent events, names, or phone numbers. ? Forget social events and appointments. ? Repeatedly forget where you put your car keys or other items.  Difficulty thinking and solving problems. You may have trouble with complex tasks, such as: ? Paying bills. ? Driving in unfamiliar places.  Difficulty communicating. You may have trouble: ? Finding the right word or naming an object. ? Forming a sentence that makes sense, or understanding what you read or hear.  Changes in your behavior or personality. When this happens, you may: ? Lose interest in the things that you used to enjoy. ? Withdraw from social situations. ? Get angry more easily than usual. ? Act before thinking. ? Do things in public that you would not usually do. How is this diagnosed? This condition is diagnosed based on:  Your symptoms. Your health care provider may ask you as well as people you spend time with, such as family and friends, about your symptoms. Questions may include: ? How often symptoms occur. ? How long they have been occurring. ? Whether they are getting worse. ? The effect they are having on your life.  Evaluation of mental functions (neuropsychological testing). Your health care provider may refer you to a neurologist or mental health specialist for a detailed evaluation of your mental functions. To identify the cause of your mild neurocognitive disorder, your health care provider may:  Get a detailed medical history.  Ask about alcohol and drug use, including prescription medicines.  Perform a physical exam.  Order blood tests and brain imaging exams. How is this treated? If mild neurocognitive disorder is caused by medicine, drug use, infection, or another medical  condition, it may improve when the cause is treated, or when medicines or drugs are stopped. Mild neurocognitive disorder resulting from other causes generally does not improve and may worsen. In these cases, the goal of treatment is to help you cope with the loss of mental function. Treatments in these cases include:  Medicine. Medicine helps mainly with memory loss and behavioral symptoms.  Talk therapy. Talk therapy provides education, emotional support, memory aids, and other ways of making up for impairments in mental function.  Lifestyle changes, including: ? Regular exercise. ? A healthy diet that includes omega-3 fatty acids. ? Intellectual stimulation. ? Increased social interaction. Follow these instructions at home: Lifestyle   Exercise regularly as told by your health care provider.  Do not use any products that contain nicotine or tobacco, such as cigarettes and e-cigarettes. If you need help quitting, ask your health care provider.  Practice stress-management techniques when you get stressed. If you need help managing stress, ask your health care provider.  Stay social.  Keep your mind active with stimulating activities you enjoy, such as reading or playing games.  Make sure to get quality sleep. These tips can help you to get a good night's rest: ? Avoid napping during the day. ? Keep your sleeping area dark and cool. ? Avoid exercising during the few hours before you go to bed. ? Avoid  caffeine products in the evening. Eating and drinking  Drink enough fluid to keep your urine clear or pale yellow.  Eat a healthy diet that includes omega-3 fatty acids. These can be found in: ? Fish. ? Nuts. ? Leafy vegetables. ? Vegetable oils. General instructions  Take over-the-counter and prescription medicines only as told by your health care provider. Your health care provider may recommend that you avoid taking medicines that can affect thinking, such as pain or sleeping  medicines.  Work with your health care provider to determine what you need help with and what your safety needs are.  Keep all follow-up visits as told by your health care provider. This is important. Contact a health care provider if:  You have any new symptoms. Get help right away if:  You develop new or worsening confusion.  You have behavioral outbursts that place you or your family in danger. Summary  Mild neurocognitive disorder is a disorder in which memory does not work as well as it should. For the most part, this condition does not interfere with a person's daily activities or ability to live independently.  Mild neurocognitive disorder can have many causes and may be the first stage of Alzheimer disease or other types of dementia.  Exercise, healthy diet, getting quality sleep, and keeping your mind active are very important for brain health. This information is not intended to replace advice given to you by your health care provider. Make sure you discuss any questions you have with your health care provider. Document Released: 02/17/2013 Document Revised: 08/21/2016 Document Reviewed: 08/21/2016 Elsevier Interactive Patient Education  2019 [employer].

## 2018-12-10 NOTE — BH Specialist Note (Signed)
Type of Service: Clinical Social Work Education officer, museum time: 2:30   End time: 2:45 Total time: 15 minutes  Demographics Michelle [name] is a 68 y.o. female  referred by Dr. McDiarmdi for assessing social needs and support.  Patient  Is pleasant and engaged in conversation,  She was alone during the visit. Reports :no concerns with social needs. No barriers to care identified.  Family/Social Information patient lives with husband, has children and grandchildren ,  family members/support persons in your life? Husband Patient drives to all appointments. no Financial concerns as of today. Receives the following services food benefits, SSI and Medicaid.  Patient enjoys reading, watching TV, playing games on her phone and visiting greatgrandchildren . Caregiving Needs Patient is able to meet all personal needs. She completed Caregiver stress assessment with a score of 10 indication of no stress in meeting her needs.  Advance Directives: Living Will or Medical POA? No , . reviewed and provided Legal resources to patient. Durable POA? No. ; Is it on file at [facility] No. . reviewed and provided Legal resources to patient.  Interventions :   Review of patient status, including notes from care team members was performed as part of care coordination as well as SDOH (Social Determinants of Health) screening performed related to challenges with: None Clinical Impressions/Recommendations :No psychosocial stressors or barriers identified today. Assessed for SDOH with no needs. Has a great support system with husband and children.  Educational information reviewed and given to patient  1. Living Will and Medical POA,  2. Resources for [employer], 3. Advance Care Planning  Plan:  Patient will review information with her husband and will consider completing advance directives however she is not interested at this time.  Casimer Lanius, LCSW [facility]   [phone] 2:54  PM

## 2018-12-11 ENCOUNTER — Encounter: Payer: Self-pay | Admitting: Family Medicine

## 2018-12-11 DIAGNOSIS — Z23 Encounter for immunization: Secondary | ICD-10-CM | POA: Insufficient documentation

## 2018-12-11 DIAGNOSIS — R2689 Other abnormalities of gait and mobility: Secondary | ICD-10-CM | POA: Insufficient documentation

## 2018-12-11 DIAGNOSIS — L853 Xerosis cutis: Secondary | ICD-10-CM | POA: Insufficient documentation

## 2018-12-11 MED ORDER — PRAMOXINE HCL 1 % EX CREA: 1.0000 | TOPICAL_CREAM | CUTANEOUS | 1 refills | Status: DC

## 2018-12-11 MED ORDER — BACLOFEN 10 MG PO TABS: 10.0000 mg | ORAL_TABLET | Freq: Three times a day (TID) | ORAL | 5 refills | Status: DC | PRN

## 2018-12-11 NOTE — Assessment & Plan Note (Signed)
Recommend stopping Vesicare (which was not on her [facility] medication list) as it was not helping. Patient had referral to [facility] (pt with history or surgery for incontinence) 05/2017.   Need to request records at some point.

## 2018-12-11 NOTE — Assessment & Plan Note (Signed)
S/P surgical repair in fall 2019. Taking Flexeril - makes her drowsy. Recommended stopping Flexeril Rx Baclofen

## 2018-12-11 NOTE — Assessment & Plan Note (Signed)
Established [place] for patient No further workup recommended Patient meets DSM-V criteria for mild neurocognitive disorder (AKA Mild Cognitive Impariment) as her MoCa score was 2 SD below mean but she is independent in her iADLs and ADLs.  She was able to recall 5/5 words at the end of 10-15 minutes testing.   Some of her decreased score may have been from difficulty understanding/following directions an inherent difficulty with arithmetic.   We discussed the lack of benefit of drug treatments for MCI. We talked about the benefits from Mediterranean Diet (fresh fruits and vegetables, lean meats and fatty fish, and mono- & poly unsaturated oils, the role of exercise and group learning in slowing the progression of MCI to dementia.  She was provided NIH patient education literature about MCI.

## 2018-12-11 NOTE — Assessment & Plan Note (Signed)
Stop Singulair as patient is not taking it.  She is taking cetirizine instead for her Allergic Rhinitis.

## 2018-12-11 NOTE — Progress Notes (Signed)
[facility]:   Patient is alone for visit Primary caregiver: self Patient's lives with their spouse. Patient information was obtained from patient and past medical records. History/Exam limitations: none. Primary Care Provider: Everrett Coombe, MD Referring provider: Everrett Coombe MD Reason for referral:  Chief Complaint  Patient presents with  . Medication Management  . Memory Loss   Previous Report Reviewed: historical medical records    Patient's Care Team No care team member to display  ------------------------------------------------ HPI by problems:  Chief Complaint  Patient presents with  . Medication Management  . Memory Loss    Cognitive impairment concern  Are there problems with thinking?  memory loss and losing things  When were the changes first noticed?  greater than 6 months ago  Did this change occur abruptly or gradually?  gradual   Has there been any tremors or abnormal movements?  yes, essential tremor dx by Neurology, on Primidone  Have they had in hallucinations or delusions:  no  Have they appeared more anxious or sad lately?  no  Do they still have interests or activities they enjor doing?  no  How has their appetite been lately?  show no change  How has their sleep been lately?  generally restful sleep   Compared to 5 to 10 years ago, how is the patient at:  Problems with Judgment, e.g., problem making decisions, bad financial decisions, problems with thinking?  no;    Less interested in hobbies or previously enjoyed activities?  No, has long enjoyed playing games on her smart phone   Problem remembering things about family and friends e.g. names,  occupations, birthdays, addresses?  no  Problem remembering conversations or news events a few days later?  yes; are worsening  Problem remembering what day and month it is? no;   Problem with losing things?  yes; are worsening  Problem learning  to use a new gadget or machine around the house, e.g., cell phones, computer, microwave, remote control?  yes; show no change  Problem with handling money for shopping?  no;   Problem handling financial matters, e.g. their pension, checking, credit cards, dealing with the bank?  no; She manages finances for both herself, her husband and her granddgt  Problem with getting lost in familiar places?    Problem with asking the same questions repeatedly or telling the same story repeatedly to the same person(s)?  no;    Has there been a change in their usual personality?  no    Geriatric Depression Scale:  5 / 15 mostly around fatigue and decrease in activities    Outpatient Encounter Medications as of 12/10/2018  Medication Sig  . albuterol (VENTOLIN HFA) 108 (90 Base) MCG/ACT inhaler INHALE TWO PUFFS BY MOUTH EVERY 4 HOURS AS NEEDED FOR WHEEZING FOR SHORTNESS OF BREATH  . aspirin EC 81 MG tablet Take 1 tablet (81 mg total) by mouth daily.  Marland Kitchen azelastine (OPTIVAR) 0.05 % ophthalmic solution Place 1 drop into both eyes 2 (two) times daily as needed (itchy eyes.).   Marland Kitchen budesonide-formoterol (SYMBICORT) 160-4.5 MCG/ACT inhaler Inhale 2 puffs into the lungs 2 (two) times daily.  . cetirizine (EQ ALLERGY RELIEF, CETIRIZINE,) 10 MG tablet Take 1 tablet (10 mg total) by mouth at bedtime.  . diclofenac sodium (VOLTAREN) 1 % GEL Apply 2 g topically 4 (four) times daily as needed (for pain).  . Dulaglutide (TRULICITY) 5.09 TO/6.7TI SOPN Inject 1.5 mg into the skin once a week.  . fluticasone (  FLONASE) 50 MCG/ACT nasal spray Place 2 sprays into both nostrils daily as needed for allergies.  Marland Kitchen gabapentin (NEURONTIN) 300 MG capsule Take 1 capsule by mouth at bedtime  . inFLIXimab (REMICADE) 100 MG injection Inject 100 mg into the vein every 7 (seven) weeks.   Marland Kitchen leflunomide (ARAVA) 10 MG tablet Take 10 mg by mouth daily.  . metFORMIN (GLUCOPHAGE) 1000 MG tablet Take 1 tablet (1,000 mg total) by mouth 2  (two) times daily with a meal.  . nitroGLYCERIN (NITROSTAT) 0.4 MG SL tablet Place 1 tablet (0.4 mg total) under the tongue every 5 (five) minutes as needed.  Marland Kitchen omeprazole (PRILOSEC) 40 MG capsule Take 1 capsule (40 mg total) by mouth 2 (two) times daily. (Patient taking differently: Take 40 mg by mouth daily. )  . oxyCODONE-acetaminophen (PERCOCET) 10-325 MG tablet Take 1 tablet by mouth every 6 (six) hours as needed for pain.  . primidone (MYSOLINE) 50 MG tablet TAKE 2 TABLETS BY MOUTH ONCE DAILY IN THE MORNING AND 1 IN THE EVENING  . rosuvastatin (CRESTOR) 10 MG tablet Take 1 tablet (10 mg total) by mouth daily. (Patient taking differently: Take 10 mg by mouth at bedtime. )  . sertraline (ZOLOFT) 50 MG tablet Take 1 tablet (50 mg total) by mouth daily. (Patient taking differently: Take 50 mg by mouth at bedtime. )  . [DISCONTINUED] hydrOXYzine (ATARAX/VISTARIL) 25 MG tablet Take 25 mg by mouth every 8 (eight) hours as needed for itching.   . baclofen (LIORESAL) 10 MG tablet Take 1 tablet (10 mg total) by mouth 3 (three) times daily as needed for muscle spasms.  . ferrous sulfate 325 (65 FE) MG tablet Take 1 tablet (325 mg total) by mouth daily with breakfast.  . glucose blood (ACCU-CHEK AVIVA) test strip Use to check blood sugar four times daily or as directed.  . Pramoxine HCl 1 % CREA Apply 1 Dose topically as directed.  . [DISCONTINUED] chlorhexidine (PERIDEX) 0.12 % solution Swish for 30 seconds with 15 mL (one capful) of undiluted oral rinse after toothbrushing, then expectorate; repeat twice daily  . [DISCONTINUED] montelukast (SINGULAIR) 10 MG tablet Take 10 mg by mouth daily.  . [DISCONTINUED] ONGLYZA 5 MG TABS tablet Take 1 tablet (5 mg total) by mouth daily.   No facility-administered encounter medications on file as of 12/10/2018.     History Patient Active Problem List   Diagnosis Date Noted  . Polypharmacy 12/02/2018    Priority: High  . Rheumatoid arthritis ([place]) 01/08/2010     Priority: High  . Diabetes mellitus without complication ([place]) 70/26/3785    Priority: High  . Urge incontinence of urine 04/18/2017    Priority: Medium  . Encounter for chronic pain management 08/05/2014    Priority: Medium  . Essential hypertension 10/30/2012    Priority: Medium  . Restless legs 05/14/2012    Priority: Medium  . Osteoporosis 08/08/2011    Priority: Medium  . Restrictive lung disease 08/09/2010    Priority: Medium  . Asthma, chronic 03/14/2010    Priority: Medium  . GERD 08/22/2008    Priority: Medium  . Hyperlipidemia 08/28/2006    Priority: Medium  . Lumbar radiculopathy 04/07/2018    Priority: Low  . Constipation 03/03/2018    Priority: Low  . Hearing loss 07/19/2015    Priority: Low  . Prolonged Q-T interval on ECG 07/05/2014    Priority: Low  . Mild cognitive impairment 07/05/2014    Priority: Low  . OBESITY, NOS 08/28/2006  Priority: Low  . Balance problem 12/11/2018  . Vaccine for VZV (varicella-zoster virus) 12/11/2018  . Dry skin 12/11/2018  . Onychomycosis 12/02/2018  . Squamous cell carcinoma in situ of skin 11/25/2014  . Pulmonary nodules 03/02/2014  . Irritable bowel 07/13/2012  . Gastroparesis 11/19/2011  . Anxiety and depression 08/28/2006  . Allergic rhinitis 08/28/2006   Past Medical History:  Diagnosis Date  . Allergy   . Anxiety   . Asthma   . Cancer ([place])    skin CA  . Cataract    beginning on left eye  . Depression   . Diabetes mellitus    Type II  . Diverticulosis   . GERD (gastroesophageal reflux disease)   . HLD (hyperlipidemia)   . Hypertension   . Lung nodules   . Osteoporosis   . RA (rheumatoid arthritis) ([facility])    CCP positive [facility]   Past Surgical History:  Procedure Laterality Date  . ABDOMINAL HYSTERECTOMY    . APPENDECTOMY    . CARPAL TUNNEL RELEASE Right   . CATARACT EXTRACTION Left    with lid lift  . CHOLECYSTECTOMY    . COLONOSCOPY    . HERNIA REPAIR    . KNEE SURGERY Right     x2- cartilage annd scar tissue  . LEFT AND RIGHT HEART CATHETERIZATION WITH CORONARY ANGIOGRAM N/A 08/17/2013   Procedure: LEFT AND RIGHT HEART CATHETERIZATION WITH CORONARY ANGIOGRAM;  Surgeon: Laverda Page, MD;  Location: [facility] CATH LAB;  Service: Cardiovascular;  Laterality: N/A;  . right foot bone spurs removed    . ROTATOR CUFF REPAIR Right   . SHOULDER SURGERY Left    x2- rotatar cuff tear  . UPPER GASTROINTESTINAL ENDOSCOPY     Family History  Problem Relation Age of Onset  . Stroke Father 60       died  . Colon cancer Maternal Aunt   . Lung cancer Brother 37       died  . Rheum arthritis Sister 51  . Colon cancer Daughter   . COPD Sister   . Cancer Sister        in nose  . Esophageal cancer Neg Hx   . Rectal cancer Neg Hx   . Stomach cancer Neg Hx    Social History   Socioeconomic History  . Marital status: Married    Spouse name: Programme researcher, broadcasting/film/video  . Number of children: 3  . Years of education: 9  . Highest education level: Not on file  Occupational History  . Occupation: disabled    Fish farm manager: UNEMPLOYED  Social Needs  . Financial resource strain: Not on file  . Food insecurity    Worry: Never true    Inability: Never true  . Transportation needs    Medical: No    Non-medical: No  Tobacco Use  . Smoking status: Former Smoker    Packs/day: 1.50    Years: 5.00    Pack years: 7.50    Types: Cigarettes    Quit date: 05/31/1994    Years since quitting: 24.5  . Smokeless tobacco: Never Used  Substance and Sexual Activity  . Alcohol use: No    Alcohol/week: 0.0 standard drinks  . Drug use: No  . Sexual activity: Not on file  Lifestyle  . Physical activity    Days per week: Not on file    Minutes per session: Not on file  . Stress: Not at all  Relationships  . Social connections  Talks on phone: Not on file    Gets together: Not on file    Attends religious service: Not on file    Active member of club or organization: Not on file    Attends meetings  of clubs or organizations: Not on file    Relationship status: Not on file  Other Topics Concern  . Not on file  Social History Narrative   Pt is on [employer] program [place]   Lives with husband and granddgt.   Lots of family stressors including special-needs grandaughter who lives with them.      Advance Directives: None - Patient's plan is that her husband will make decisions for her if she were not capable of making informed decisions for herself. (Discussion with Sherren Mocha Alfredia Desanctis, MD 12/11/18)   Full Code: Desires CPR/ACLS  (Discussion with Sherren Mocha Haaris Metallo, MD 12/11/18)            Cardiovascular Risk Factors: Hypertension, IDDM Type 2, Lipids and Rheumatoid Arthritis  Educational History: 9 years formal education Personal History of Seizures: No -  Personal History of Stroke: No - Personal History of Head Trauma: No -  Personal History of Psychiatric Disorders: Yes - Anxiety and Depression Family History of Dementia: No -      Basic Activities of Daily Living  Dressing: Self-care Eating: Self-care Ambulation: Self-care Toileting: Self-care Bathing: Self-care  Instrumental Activities of Daily Living Shopping: Self-care House/Yard Work: Self-care Administration of medications: Self-care Finances: Self-care Telephone: Self-care Transportation: Self-care  Fiances stress: Fixed, low income  FALLS in last five office visits:  Fall Risk  12/10/2018 12/02/2018 09/16/2018 09/02/2018 06/04/2018  Falls in the past year? 1 0 1 0 1  Number falls in past yr: 1 - 1 - 0  Injury with Fall? - - 0 - 1  Risk Factor Category  - - - - -  Risk for fall due to : - - History of fall(s);Impaired balance/gait - -  Follow up - - Falls evaluation completed - -    Health Maintenance reviewed: Immunization History  Administered Date(s) Administered  . Influenza Split 03/13/2012  . Influenza Whole 06/12/2007, 04/05/2008, 05/02/2009, 04/06/2010  . Influenza,inj,Quad PF,6+ Mos 03/16/2013,  03/02/2014, 04/12/2015, 04/02/2016, 03/26/2017  . Influenza-Unspecified 03/30/2013, 04/03/2015, 03/01/2017, 03/13/2018  . Meningococcal Polysaccharide 04/03/2015  . PPD Test 03/11/2012  . Pneumococcal Conjugate-13 04/12/2015  . Pneumococcal Polysaccharide-23 07/20/2015, 10/01/2017  . Td 01/03/2005  . Tdap 10/01/2011   Health Maintenance Topics with due status: Overdue     Topic Date Due   FOOT EXAM 12/27/2017   Health Maintenance Topics with due status: Due On     Topic Date Due   OPHTHALMOLOGY EXAM 11/14/2018    Diet: Regular Nutritional supplements: none  Geriatric Syndromes:  Constipation yes   Incontinence yes  Balance impairment:yes    Impaired Memory or Cognition yes   Joint pain: yes Joint stiffness: yes Osteoporosis: yes Hearing loss: yes, wears unilateral intraaural hearing aid  ROS Denies fevers/chills; myalgias Denies changes in vision / hearing Denies sore throat / cough; Denies dysuria (+) constipation; denies melena/hematochezia Denies (+) heart burn;  (+) recent fall (+) tremors that can interfere with carrying lighter objects that need to be balanced  (+) sadness, see GDS  Vital Signs   There is no height or weight on file to calculate BMI. CrCl cannot be calculated (Patient's most recent lab result is older than the maximum 21 days allowed.). There is no height or weight on file to calculate BSA. Vitals:  12/10/18 1337  BP: 128/68  Pulse: (!) 108  SpO2: 94%   Wt Readings from Last 3 Encounters:  12/02/18 152 lb (68.9 kg)  09/16/18 149 lb (67.6 kg)  09/02/18 149 lb 6.4 oz (67.8 kg)    Hearing Screening   125Hz  250Hz  500Hz  1000Hz  2000Hz  3000Hz  4000Hz  6000Hz  8000Hz   Right ear:           Left ear:             Visual Acuity Screening   Right eye Left eye Both eyes  Without correction: 20/50 20/40 20/40   With correction:       Physical Examination:  VS reviewed GEN: Alert, Cooperative, Groomed, NAD HEENT: PERRL; EAC bilaterally  not occluded, TM's translucent with normal LM, (+) LR;   COR: RRR, No M/G/R   EXT: No peripheral leg edema.   Gait: No significant path deviation, Step-through present  Psych: Normal affect/thought/speech/language 4-Stage Stand Test:  Feet Side-by-side: Yes.       Feet Semi-tandem: Yes.       Feet Tandem: No.        Montreal Cognitive Assessment:  Patient did  require additional cues or prompts to complete tasks. Patient was cooperative and attentive to testing tasks Patient did  appear motivated to perform well  No flowsheet data found.      Montreal Cognitive Assessment  12/11/2018  Visuospatial/ Executive (0/5) 2  Naming (0/3) 3  Attention: Read list of digits (0/2) 2  Attention: Read list of letters (0/1) 0  Attention: Serial 7 subtraction starting at 100 (0/3) 1  Language: Repeat phrase (0/2) 1  Language : Fluency (0/1) 1  Abstraction (0/2) 2  Delayed Recall (0/5) 5  Orientation (0/6) 5  Total 22  Adjusted Score (based on education) 23      Labs No components found for: [facility]  Lab Results  Component Value Date   VITAMINB12 394 09/16/2018    Lab Results  Component Value Date   FOLATE 11.8 09/16/2018    Lab Results  Component Value Date   TSH 2.17 09/16/2018    No results found for: RPR  Lab Results  Component Value Date   HIV NONREACTIVE 03/02/2014      Chemistry      Component Value Date/Time   NA 134 (L) 04/08/2018 0443   NA 138 11/26/2016 0943   K 4.0 04/08/2018 0443   CL 103 04/08/2018 0443   CO2 25 04/08/2018 0443   BUN 8 04/08/2018 0443   BUN 18 11/26/2016 0943   CREATININE 0.57 04/08/2018 0443   CREATININE 0.46 (L) 07/22/2016 1026      Component Value Date/Time   CALCIUM 8.7 (L) 04/08/2018 0443   ALKPHOS 88 11/26/2016 0943   AST 24 11/26/2016 0943   ALT 24 11/26/2016 0943   BILITOT 0.3 11/26/2016 0943       Lab Results  Component Value Date   HGBA1C 7.6 (A) 12/02/2018     @10RELATIVEDAYS @  Hearing Screening    125Hz  250Hz  500Hz  1000Hz  2000Hz  3000Hz  4000Hz  6000Hz  8000Hz   Right ear:           Left ear:             Visual Acuity Screening   Right eye Left eye Both eyes  Without correction: 20/50 20/40 20/40   With correction:      Lab Results  Component Value Date   WBC 11.6 (H) 04/08/2018   HGB 10.4 (L) 04/08/2018   HCT 33.1 (L)  04/08/2018   MCV 97.9 04/08/2018   PLT 184 04/08/2018    No results found for this or any previous visit (from the past 24 hour(s)).  Imaging Head CT: None in [facility]  Brain MRI:  None in [facility]    Personal Strengths Active sense of humor Capable of independent living Communication skills Supportive family/friends  Support System Strengths Supportive Relationships   Advanced Directives Code Status: Full, wants to pursue CPR/ACLS Advance Directives: None - Plan is for husband to make decisions for her if she were not capable of  Making informed decisions ------------------------------------------------------------------------------------------------------------------------------------------------------------------------------------------------------------------------------------------------------------------------------------------------------------------------------------------------------------------------------------------------------------------------------------------------------------------------------------------------------------  Assessment and Plan: Please see individual consultation note fromd social work for today.    Problem List Items Addressed This Visit      High   Diabetes mellitus without complication ([place]) (Chronic)    Pharmacy review of medication regiment for Mrs Michelle [name] recommended - Stopping DDP4 inhibitor sixagliptin since patient is on a GLP1R agonist, so does not need medication to increase GLP.     DPP4I without evidence of CV benefit and GLP1R agonist having CV benefit.         Polypharmacy     Medium   GERD - Primary  (Chronic)    Patient would like to stop omeprazole but she has rebound GERD when she stops.  Last EGD 04/04/19 did not show Barrett's esophagitis, only mild antral gastritis.  Recommend discussion with PCP about reduction of omeprazole dose from 40 mg daily to 20 mg daily for 12 weeks, the go to daily H2B, if symptoms then persist  For 3-7 days, and interfere with normal activities, then consider returning to last effective medication and dose.      Osteoporosis   Restless legs (Chronic)    Patient is taking ferrous sulfate as directed by a specialist. This would be reasonable therapy for RLS      Relevant Medications   ferrous sulfate 325 (65 FE) MG tablet   Urge incontinence of urine    Recommend stopping Vesicare (which was not on her [facility] medication list) as it was not helping. Patient had referral to [facility] (pt with history or surgery for incontinence) 05/2017.   Need to request records at some point.         Low   Lumbar radiculopathy    S/P surgical repair in fall 2019. Taking Flexeril - makes her drowsy. Recommended stopping Flexeril Rx Baclofen      Relevant Medications   baclofen (LIORESAL) 10 MG tablet   Mild cognitive impairment    Established [place] for patient No further workup recommended Patient meets DSM-V criteria for mild neurocognitive disorder (AKA Mild Cognitive Impariment) as her MoCa score was 2 SD below mean but she is independent in her iADLs and ADLs.  She was able to recall 5/5 words at the end of 10-15 minutes testing.   Some of her decreased score may have been from difficulty understanding/following directions an inherent difficulty with arithmetic.   We discussed the lack of benefit of drug treatments for MCI. We talked about the benefits from Mediterranean Diet (fresh fruits and vegetables, lean meats and fatty fish, and mono- & poly unsaturated oils, the role of exercise and group learning in slowing the progression of MCI to dementia.   She was provided NIH patient education literature about MCI.            Unprioritized   Allergic rhinitis    Stop Singulair as patient is not taking it.  She is taking cetirizine  instead for her Allergic Rhinitis.       Balance problem   Dry skin    Itching Dry Skin Stop use of hydroxyzine bc very anticholinergic.  Impairs cognition in older adults.  Recommend moisturing and may use  pramoxine 1% cream on areas with itching prn      Relevant Medications   Pramoxine HCl 1 % CREA   Vaccine for VZV (varicella-zoster virus)    Other Visit Diagnoses    Muscle spasms of lower extremity, unspecified laterality       Relevant Medications   baclofen (LIORESAL) 10 MG tablet     Mild cognitive impairment Established [place] for patient No further workup recommended Patient meets DSM-V criteria for mild neurocognitive disorder (AKA Mild Cognitive Impariment) as her MoCa score was 2 SD below mean but she is independent in her iADLs and ADLs.  She was able to recall 5/5 words at the end of 10-15 minutes testing.   Some of her decreased score may have been from difficulty understanding/following directions an inherent difficulty with arithmetic.   We discussed the lack of benefit of drug treatments for MCI. We talked about the benefits from Mediterranean Diet (fresh fruits and vegetables, lean meats and fatty fish, and mono- & poly unsaturated oils, the role of exercise and group learning in slowing the progression of MCI to dementia.  She was provided NIH patient education literature about MCI.      Diabetes mellitus without complication [facility]) Pharmacy review of medication regiment for Mrs Michelle [name] recommended - Stopping DDP4 inhibitor sixagliptin since patient is on a GLP1R agonist, so does not need medication to increase GLP.     DPP4I without evidence of CV benefit and GLP1R agonist having CV benefit.     Allergic rhinitis Stop Singulair as patient is not taking it.  She is taking  cetirizine instead for her Allergic Rhinitis.   Urge incontinence of urine Recommend stopping Vesicare (which was not on her [facility] medication list) as it was not helping. Patient had referral to [facility] (pt with history or surgery for incontinence) 05/2017.   Need to request records at some point.   Lumbar radiculopathy S/P surgical repair in fall 2019. Taking Flexeril - makes her drowsy. Recommended stopping Flexeril Rx Baclofen  Dry skin Itching Dry Skin Stop use of hydroxyzine bc very anticholinergic.  Impairs cognition in older adults.  Recommend moisturing and may use  pramoxine 1% cream on areas with itching prn  GERD Patient would like to stop omeprazole but she has rebound GERD when she stops.  Last EGD 04/04/19 did not show Barrett's esophagitis, only mild antral gastritis.  Recommend discussion with PCP about reduction of omeprazole dose from 40 mg daily to 20 mg daily for 12 weeks, the go to daily H2B, if symptoms then persist  For 3-7 days, and interfere with normal activities, then consider returning to last effective medication and dose.  Restless legs Patient is taking ferrous sulfate as directed by a specialist. This would be reasonable therapy for RLS       Patient to Follow up with  Dr.Feng or [facility]   > 60 minutes face to face were spent in total with interdisciplinary discussion, patient and caretaker counseling and coordination of care took more than 20 minutes. The Geriatric interdisciplinary team meet to discuss the patient's assessment, problem list, and recommendations.  The interdisciplinary team consisted of representatives from medicine, pharmacy, and social work. The interdisciplinary team meet with the  patient and caretakers to review the team's findings, assessments, and recommendations.

## 2018-12-11 NOTE — Assessment & Plan Note (Signed)
Pharmacy review of medication regiment for Mrs Michelle [name] recommended - Stopping DDP4 inhibitor sixagliptin since patient is on a GLP1R agonist, so does not need medication to increase GLP.     DPP4I without evidence of CV benefit and GLP1R agonist having CV benefit.

## 2018-12-11 NOTE — Assessment & Plan Note (Signed)
Patient is taking ferrous sulfate as directed by a specialist. This would be reasonable therapy for RLS

## 2018-12-11 NOTE — Assessment & Plan Note (Addendum)
Itching Dry Skin Stop use of hydroxyzine bc very anticholinergic.  Impairs cognition in older adults.  Recommend moisturing and may use  pramoxine 1% cream on areas with itching prn

## 2018-12-11 NOTE — Assessment & Plan Note (Addendum)
Patient would like to stop omeprazole but she has rebound GERD when she stops.  Last EGD 04/04/19 did not show Barrett's esophagitis, only mild antral gastritis.  Recommend discussion with PCP about reduction of omeprazole dose from 40 mg daily to 20 mg daily for 12 weeks, the go to daily H2B, if symptoms then persist  For 3-7 days, and interfere with normal activities, then consider returning to last effective medication and dose.

## 2018-12-17 DIAGNOSIS — M059 Rheumatoid arthritis with rheumatoid factor, unspecified: Secondary | ICD-10-CM | POA: Diagnosis not present

## 2018-12-22 ENCOUNTER — Other Ambulatory Visit: Payer: Self-pay | Admitting: Student in an Organized Health Care Education/Training Program

## 2018-12-22 DIAGNOSIS — J45909 Unspecified asthma, uncomplicated: Secondary | ICD-10-CM

## 2019-01-04 ENCOUNTER — Other Ambulatory Visit: Payer: Self-pay | Admitting: *Deleted

## 2019-01-04 DIAGNOSIS — E119 Type 2 diabetes mellitus without complications: Secondary | ICD-10-CM

## 2019-01-04 MED ORDER — ACCU-CHEK AVIVA VI STRP: ORAL_STRIP | 12 refills | Status: DC

## 2019-01-04 NOTE — Telephone Encounter (Signed)
Sending to [place] certified provider to fill. Christen Bame, CMA

## 2019-01-18 ENCOUNTER — Other Ambulatory Visit: Payer: Self-pay | Admitting: *Deleted

## 2019-01-18 MED ORDER — CETIRIZINE HCL 10 MG PO TABS: 10.0000 mg | ORAL_TABLET | Freq: Every day | ORAL | 3 refills | Status: DC

## 2019-01-18 MED ORDER — SERTRALINE HCL 50 MG PO TABS: 50.0000 mg | ORAL_TABLET | Freq: Every day | ORAL | 3 refills | Status: DC

## 2019-01-20 ENCOUNTER — Encounter: Payer: Self-pay | Admitting: Podiatry

## 2019-01-20 ENCOUNTER — Other Ambulatory Visit: Payer: Self-pay

## 2019-01-20 ENCOUNTER — Ambulatory Visit (INDEPENDENT_AMBULATORY_CARE_PROVIDER_SITE_OTHER): Payer: Medicare Other | Admitting: Podiatry

## 2019-01-20 VITALS — Temp 98.1°F

## 2019-01-20 DIAGNOSIS — L84 Corns and callosities: Secondary | ICD-10-CM

## 2019-01-20 DIAGNOSIS — E119 Type 2 diabetes mellitus without complications: Secondary | ICD-10-CM | POA: Diagnosis not present

## 2019-01-20 DIAGNOSIS — M79675 Pain in left toe(s): Secondary | ICD-10-CM | POA: Diagnosis not present

## 2019-01-20 DIAGNOSIS — Z79899 Other long term (current) drug therapy: Secondary | ICD-10-CM | POA: Diagnosis not present

## 2019-01-20 DIAGNOSIS — M25552 Pain in left hip: Secondary | ICD-10-CM | POA: Diagnosis not present

## 2019-01-20 DIAGNOSIS — M059 Rheumatoid arthritis with rheumatoid factor, unspecified: Secondary | ICD-10-CM | POA: Diagnosis not present

## 2019-01-20 DIAGNOSIS — M549 Dorsalgia, unspecified: Secondary | ICD-10-CM | POA: Diagnosis not present

## 2019-01-20 DIAGNOSIS — B351 Tinea unguium: Secondary | ICD-10-CM

## 2019-01-20 DIAGNOSIS — M79674 Pain in right toe(s): Secondary | ICD-10-CM

## 2019-01-20 NOTE — Patient Instructions (Signed)
Diabetes Mellitus and Foot Care Foot care is an important part of your health, especially when you have diabetes. Diabetes may cause you to have problems because of poor blood flow (circulation) to your feet and legs, which can cause your skin to:  Become thinner and drier.  Break more easily.  Heal more slowly.  Peel and crack. You may also have nerve damage (neuropathy) in your legs and feet, causing decreased feeling in them. This means that you may not notice minor injuries to your feet that could lead to more serious problems. Noticing and addressing any potential problems early is the best way to prevent future foot problems. How to care for your feet Foot hygiene  Wash your feet daily with warm water and mild soap. Do not use hot water. Then, pat your feet and the areas between your toes until they are completely dry. Do not soak your feet as this can dry your skin.  Trim your toenails straight across. Do not dig under them or around the cuticle. File the edges of your nails with an emery board or nail file.  Apply a moisturizing lotion or petroleum jelly to the skin on your feet and to dry, brittle toenails. Use lotion that does not contain alcohol and is unscented. Do not apply lotion between your toes. Shoes and socks  Wear clean socks or stockings every day. Make sure they are not too tight. Do not wear knee-high stockings since they may decrease blood flow to your legs.  Wear shoes that fit properly and have enough cushioning. Always look in your shoes before you put them on to be sure there are no objects inside.  To break in new shoes, wear them for just a few hours a day. This prevents injuries on your feet. Wounds, scrapes, corns, and calluses  Check your feet daily for blisters, cuts, bruises, sores, and redness. If you cannot see the bottom of your feet, use a mirror or ask someone for help.  Do not cut corns or calluses or try to remove them with medicine.  If you  find a minor scrape, cut, or break in the skin on your feet, keep it and the skin around it clean and dry. You may clean these areas with mild soap and water. Do not clean the area with peroxide, alcohol, or iodine.  If you have a wound, scrape, corn, or callus on your foot, look at it several times a day to make sure it is healing and not infected. Check for: ? Redness, swelling, or pain. ? Fluid or blood. ? Warmth. ? Pus or a bad smell. General instructions  Do not cross your legs. This may decrease blood flow to your feet.  Do not use heating pads or hot water bottles on your feet. They may burn your skin. If you have lost feeling in your feet or legs, you may not know this is happening until it is too late.  Protect your feet from hot and cold by wearing shoes, such as at the beach or on hot pavement.  Schedule a complete foot exam at least once a year (annually) or more often if you have foot problems. If you have foot problems, report any cuts, sores, or bruises to your health care provider immediately. Contact a health care provider if:  You have a medical condition that increases your risk of infection and you have any cuts, sores, or bruises on your feet.  You have an injury that is not   healing.  You have redness on your legs or feet.  You feel burning or tingling in your legs or feet.  You have pain or cramps in your legs and feet.  Your legs or feet are numb.  Your feet always feel cold.  You have pain around a toenail. Get help right away if:  You have a wound, scrape, corn, or callus on your foot and: ? You have pain, swelling, or redness that gets worse. ? You have fluid or blood coming from the wound, scrape, corn, or callus. ? Your wound, scrape, corn, or callus feels warm to the touch. ? You have pus or a bad smell coming from the wound, scrape, corn, or callus. ? You have a fever. ? You have a red line going up your leg. Summary  Check your feet every day  for cuts, sores, red spots, swelling, and blisters.  Moisturize feet and legs daily.  Wear shoes that fit properly and have enough cushioning.  If you have foot problems, report any cuts, sores, or bruises to your health care provider immediately.  Schedule a complete foot exam at least once a year (annually) or more often if you have foot problems. This information is not intended to replace advice given to you by your health care provider. Make sure you discuss any questions you have with your health care provider. Document Released: 06/14/2000 Document Revised: 07/30/2017 Document Reviewed: 07/19/2016 Elsevier Patient Education  2020 [employer]   Onychomycosis/Fungal Toenails  WHAT IS IT? An infection that lies within the keratin of your nail plate that is caused by a fungus.  WHY ME? Fungal infections affect all ages, sexes, races, and creeds.  There may be many factors that predispose you to a fungal infection such as age, coexisting medical conditions such as diabetes, or an autoimmune disease; stress, medications, fatigue, genetics, etc.  Bottom line: fungus thrives in a warm, moist environment and your shoes offer such a location.  IS IT CONTAGIOUS? Theoretically, yes.  You do not want to share shoes, nail clippers or files with someone who has fungal toenails.  Walking around barefoot in the same room or sleeping in the same bed is unlikely to transfer the organism.  It is important to realize, however, that fungus can spread easily from one nail to the next on the same foot.  HOW DO WE TREAT THIS?  There are several ways to treat this condition.  Treatment may depend on many factors such as age, medications, pregnancy, liver and kidney conditions, etc.  It is best to ask your doctor which options are available to you.  1. No treatment.   Unlike many other medical concerns, you can live with this condition.  However for many people this can be a painful condition and may lead to  ingrown toenails or a bacterial infection.  It is recommended that you keep the nails cut short to help reduce the amount of fungal nail. 2. Topical treatment.  These range from herbal remedies to prescription strength nail lacquers.  About 40-50% effective, topicals require twice daily application for approximately 9 to 12 months or until an entirely new nail has grown out.  The most effective topicals are medical grade medications available through physicians offices. 3. Oral antifungal medications.  With an 80-90% cure rate, the most common oral medication requires 3 to 4 months of therapy and stays in your system for a year as the new nail grows out.  Oral antifungal medications do require   blood work to make sure it is a safe drug for you.  A liver function panel will be performed prior to starting the medication and after the first month of treatment.  It is important to have the blood work performed to avoid any harmful side effects.  In general, this medication safe but blood work is required. 4. Laser Therapy.  This treatment is performed by applying a specialized laser to the affected nail plate.  This therapy is noninvasive, fast, and non-painful.  It is not covered by insurance and is therefore, out of pocket.  The results have been very good with a 80-95% cure rate.  The [facility] is the only practice in the area to offer this therapy. 5. Permanent Nail Avulsion.  Removing the entire nail so that a new nail will not grow back. 

## 2019-01-21 ENCOUNTER — Ambulatory Visit (INDEPENDENT_AMBULATORY_CARE_PROVIDER_SITE_OTHER): Payer: Medicare Other | Admitting: Sports Medicine

## 2019-01-21 ENCOUNTER — Other Ambulatory Visit: Payer: Self-pay

## 2019-01-21 ENCOUNTER — Encounter: Payer: Self-pay | Admitting: Sports Medicine

## 2019-01-21 DIAGNOSIS — M67952 Unspecified disorder of synovium and tendon, left thigh: Secondary | ICD-10-CM | POA: Diagnosis not present

## 2019-01-21 MED ORDER — PREDNISONE 10 MG PO TABS: ORAL_TABLET | ORAL | 0 refills | Status: DC

## 2019-01-21 NOTE — Progress Notes (Signed)
   PCP: [name], MD  Subjective:   HPI: Patient is a 68 y.o. female here for left hip pain x 3 months Hurts when she sweeps and lays on that side. She is concerned it may be her back however denies any actual worsening back pain. Denies any groin pain. Does endorse some numbness/tingling that extends down to her knee but not beyond.  She is status post lumbar spine fusion in October of this past year.  Has done well postoperatively.  Review of Systems:  Per HPI.   [place], medications and smoking status reviewed.      Objective:  Physical Exam:  Gen: awake, alert, NAD, comfortable in exam room Pulm: breathing unlabored  Hip:  - Inspection: No gross deformity, no swelling, erythema, or ecchymosis - Palpation: Tender to palpation along posterior aspect of greater trochanter. No tenderness at greater trochanter - ROM: Normal range of motion on Flexion, extension, abduction, internal and external rotation - Strength: General weakness with getting up and down from the exam table. - Neuro/vasc: NV intact distally    Assessment & Plan:   Tendinopathy of left gluteus medius History and PE most consistent with gluteus medius tendonopathy. No grown pain or back pain to indicate other pathology at this time. We will treat with 6 day course of prednisone and home strengthening exercises. She will also benefit from physical therapy as well. She is open to this.  Aleve/Ibuprofen or Tylenol for pain as needed. RTC in 6 weeks for follow up.   No orders of the defined types were placed in this encounter.   Meds ordered this encounter  Medications  . predniSONE (DELTASONE) 10 MG tablet    Sig: Take as directed per MD instructions    Dispense:  21 tablet    Refill:  0    Mina Marble, DO [place], PGY2 01/21/2019 9:12 AM  Patient seen and evaluated with the resident.  I agree with the above plan of care.  Patient is reassured that I do not think her symptoms are  coming from her lumbar spine. Definitely not coming from the hip joint. She is tender to palpation along the course of the gluteus medius tendon and has significant weakness of her hip muscles.  I did discuss formal physical therapy but she would like to hold on that for now.  Instead, we will place her on a 6-day Sterapred Dosepak and she will start a home exercise program consisting of hip abductor strengthening and pelvic stabilizer exercises, specifically standing external rotation exercises of the left leg.  If she continues to have pain we will need to reconsider merits of formal physical therapy.  Follow-up as needed.

## 2019-01-21 NOTE — Patient Instructions (Signed)
Thank you so much for coming in to see [place]. We think you may have tendonopathy of your gluteus medius. We will treat this with a 6 day course of prednisone dose pack and home exercises to strengthen those muscles. It is okay to use Aleve/Ibuprofen or Tylenol to help with pain.  You may use your muscle relaxer as needed at night for comfort.  Please follow up in 6 weeks, sooner if worsening or no improvement.  Thanks again!  Take care, Dr. Tarry Kos and Dr. Micheline Chapman

## 2019-01-21 NOTE — Assessment & Plan Note (Signed)
History and PE most consistent with gluteus medius tendonopathy. No grown pain or back pain to indicate other pathology at this time. We will treat with 6 day course of prednisone and home strengthening exercises. She will also benefit from physical therapy as well. She is open to this.  Aleve/Ibuprofen or Tylenol for pain as needed. RTC in 6 weeks for follow up.

## 2019-01-24 NOTE — Progress Notes (Signed)
Subjective: Michelle [name] presents to clinic for preventative diabetic foot care with cc of painful mycotic toenails both feet and corn distal tip 3rd toe left foot  which are aggravated when weightbearing with and without shoe gear.  This pain limits her daily activities. Pain symptoms resolve with periodic professional debridement.  Michelle Pike, MD is her PCP.    Current Outpatient Medications:  .  aspirin EC 81 MG tablet, Take 1 tablet (81 mg total) by mouth daily., Disp: 1 tablet, Rfl:  .  azelastine (OPTIVAR) 0.05 % ophthalmic solution, Place 1 drop into both eyes 2 (two) times daily as needed (itchy eyes.). , Disp: , Rfl:  .  baclofen (LIORESAL) 10 MG tablet, Take 1 tablet (10 mg total) by mouth 3 (three) times daily as needed for muscle spasms., Disp: 30 each, Rfl: 5 .  budesonide-formoterol (SYMBICORT) 160-4.5 MCG/ACT inhaler, Inhale 2 puffs into the lungs 2 (two) times daily., Disp: , Rfl:  .  cetirizine (EQ ALLERGY RELIEF, CETIRIZINE,) 10 MG tablet, Take 1 tablet (10 mg total) by mouth at bedtime., Disp: 90 tablet, Rfl: 3 .  cyclobenzaprine (FLEXERIL) 10 MG tablet, Take 10 mg by mouth 3 (three) times daily., Disp: , Rfl:  .  diclofenac sodium (VOLTAREN) 1 % GEL, Apply 2 g topically 4 (four) times daily as needed (for pain)., Disp: , Rfl:  .  Dulaglutide (TRULICITY) 0.10 XN/2.3FT SOPN, Inject 1.5 mg into the skin once a week., Disp: 4 mL, Rfl: 2 .  ferrous sulfate 325 (65 FE) MG tablet, Take 1 tablet (325 mg total) by mouth daily with breakfast., Disp: , Rfl:  .  fluconazole (DIFLUCAN) 150 MG tablet, TAKE 1 TABLET BY MOUTH EVERY THIRD DAY. FOR THREE DOSES, Disp: , Rfl:  .  fluticasone (FLONASE) 50 MCG/ACT nasal spray, Place 2 sprays into both nostrils daily as needed for allergies., Disp: 48 g, Rfl: 3 .  gabapentin (NEURONTIN) 300 MG capsule, Take 1 capsule by mouth at bedtime, Disp: 90 capsule, Rfl: 0 .  glucose blood (ACCU-CHEK AVIVA) test strip, Use to check blood sugar three  times daily or as directed. Dx code: E11.9, Disp: 300 each, Rfl: 12 .  inFLIXimab (REMICADE) 100 MG injection, Inject 100 mg into the vein every 7 (seven) weeks. , Disp: , Rfl:  .  leflunomide (ARAVA) 10 MG tablet, Take 10 mg by mouth daily., Disp: , Rfl:  .  metFORMIN (GLUCOPHAGE) 1000 MG tablet, Take 1 tablet (1,000 mg total) by mouth 2 (two) times daily with a meal., Disp: 180 tablet, Rfl: 3 .  mometasone (ELOCON) 0.1 % cream, APPLY CREAM TOPICALLY TO AFFECTED AREA TWICE DAILY. FACE AND ARMS, Disp: , Rfl:  .  nitroGLYCERIN (NITROSTAT) 0.4 MG SL tablet, Place 1 tablet (0.4 mg total) under the tongue every 5 (five) minutes as needed., Disp: 30 tablet, Rfl: 0 .  omeprazole (PRILOSEC) 40 MG capsule, Take 1 capsule (40 mg total) by mouth 2 (two) times daily. (Patient taking differently: Take 40 mg by mouth daily. ), Disp: 180 capsule, Rfl: 3 .  oxyCODONE-acetaminophen (PERCOCET) 10-325 MG tablet, Take 1 tablet by mouth every 6 (six) hours as needed for pain., Disp: 120 tablet, Rfl: 0 .  Pramoxine HCl 1 % CREA, Apply 1 Dose topically as directed., Disp: 340 g, Rfl: 1 .  predniSONE (DELTASONE) 5 MG tablet, TAKE 4 TABLETS BY MOUTH DAILY FOR 1 WEEK THEN TAKE 3 TABLETS BY MOUTH DAILY FOR 1 WEEK THEN 2 TABLETS BY MOUTH DAILY FOR 1  WEEK THEN 1 TABLE, Disp: , Rfl:  .  primidone (MYSOLINE) 50 MG tablet, TAKE 2 TABLETS BY MOUTH ONCE DAILY IN THE MORNING AND 1 IN THE EVENING, Disp: 270 tablet, Rfl: 0 .  rosuvastatin (CRESTOR) 10 MG tablet, Take 1 tablet (10 mg total) by mouth daily. (Patient taking differently: Take 10 mg by mouth at bedtime. ), Disp: 90 tablet, Rfl: 3 .  sertraline (ZOLOFT) 50 MG tablet, Take 1 tablet (50 mg total) by mouth daily., Disp: 90 tablet, Rfl: 3 .  solifenacin (VESICARE) 5 MG tablet, Take 5 mg by mouth daily., Disp: , Rfl:  .  VENTOLIN HFA 108 (90 Base) MCG/ACT inhaler, INHALE 2 PUFFS BY MOUTH EVERY 4 HOURS AS NEEDED FOR WHEEZING FOR SHORTNESS OF BREATH, Disp: 108 g, Rfl: 0 .   predniSONE (DELTASONE) 10 MG tablet, Take as directed per MD instructions, Disp: 21 tablet, Rfl: 0   Allergies  Allergen Reactions  . Zofran [Ondansetron Hcl] Other (See Comments)    Prolong QT- pt unsure of this allergy  . Ace Inhibitors Cough    Occurred with liinopril  . Levofloxacin Other (See Comments)    Felt things on her legs that were not there,made her stomach hurt  . Lisinopril Cough  . Sulfonamide Derivatives Hives and Itching     Objective: Vitals:   01/20/19 1530  Temp: 98.1 F (36.7 C)    Physical Examination:  Vascular  Examination: Capillary refill time immediate x 10 digits.  Palpable DP/PT pulses b/l.  Digital hair sparse b/l.  No edema noted b/l.  Skin temperature gradient WNL b/l.  Dermatological Examination: Skin with normal turgor, texture and tone b/l.  No open wounds b/l.  No interdigital macerations noted b/l.  Elongated, thick, discolored brittle toenails with subungual debris and pain on dorsal palpation of nailbeds 1-5 b/l.  Hyperkeratotic lesion distal tip left 3rd digit. No erythema, no edema, no drainage, no flocculence noted.  Musculoskeletal Examination: Muscle strength 5/5 to all muscle groups b/l.  No pain, crepitus or joint discomfort with active/passive ROM.  Neurological Examination: Sensation intact 5/5 b/l with 10 gram monofilament.  Vibratory sensation intact b/l.  Assessment: 1. Mycotic nail infection with pain 1-5 b/l 2. Corn left 3rd digit 3. NIDDM  Plan: 1. Toenails 1-5 b/l were debrided in length and girth without iatrogenic laceration. 2. Corn(s) pared 3rd digit utilizing sterile scalpel blade without incident. 3. Continue soft, supportive shoe gear daily. 4. Report any pedal injuries to medical professional. 5. Follow up 3 months. 6. Patient/POA to call should there be a question/concern in there interim.

## 2019-01-25 ENCOUNTER — Telehealth: Payer: Self-pay | Admitting: *Deleted

## 2019-01-25 ENCOUNTER — Other Ambulatory Visit: Payer: Self-pay

## 2019-01-25 ENCOUNTER — Ambulatory Visit (INDEPENDENT_AMBULATORY_CARE_PROVIDER_SITE_OTHER): Payer: Medicare Other | Admitting: Family Medicine

## 2019-01-25 DIAGNOSIS — I10 Essential (primary) hypertension: Secondary | ICD-10-CM

## 2019-01-25 MED ORDER — FLUCONAZOLE 150 MG PO TABS: 150.0000 mg | ORAL_TABLET | Freq: Every day | ORAL | 0 refills | Status: DC

## 2019-01-25 MED ORDER — LOSARTAN POTASSIUM 25 MG PO TABS: 12.5000 mg | ORAL_TABLET | Freq: Every day | ORAL | 0 refills | Status: DC

## 2019-01-25 NOTE — Patient Instructions (Addendum)
It was great meeting you today!  Sounds like your blood pressures been a little elevated recently.  We came to the mutual conclusion to start losartan 12.5 mg daily.  This is a tiny dose so you may not get any benefit from it but I would rather you not go too low.  We can see how you do for next month on this dose, when you meet your new PCP in around a month he can check your blood pressure and adjust as needed.  The medication comes in 25 mg tablets.  You can just cut it in half and take one half daily.

## 2019-01-25 NOTE — Assessment & Plan Note (Signed)
BP 140/83, and very similar on recheck.  Had long discussion with patient.  Discussed not starting any medication, following up in 1 month with PCP.  Discussed starting losartan 12.5 mg.  Care to mutual decision to start losartan 12.5 mg daily.  Patient to follow-up in around 1 month with new PCP for recheck.

## 2019-01-25 NOTE — Telephone Encounter (Signed)
Per Micheline Chapman, will call in Diflucan 150mg . This is a one time dose

## 2019-01-25 NOTE — Progress Notes (Signed)
   HPI 68 year old female who presents for hypertension follow-up.  Patient previously on losartan 69 mg  This medication was discontinued as she was persistently normotensive.  Patient states that she was recently seen at her endocrinologist and sports medicine clinic noted to be hypertensive at both visits.  Patient presents with blood pressure around 140/90.  Allowed to rest for 10 minutes, rechecked and was the same.  CC: Blood pressure   ROS:   Review of Systems See HPI for ROS.   CC, SH/smoking status, and VS noted  Objective: BP 140/83   Pulse 84   Temp 98 F (36.7 C) (Oral)   Wt 148 lb (67.1 kg)   SpO2 95%   BMI 26.22 kg/m  Gen: 68 year old Caucasian female, no acute distress, resting comfortably CV: RRR, no murmur, palpable radial pulse bilaterally Resp: CTAB, no wheezes, non-labored Neuro: Alert and oriented, Speech clear, No gross deficits   Assessment and plan:  Essential hypertension BP 140/83, and very similar on recheck.  Had long discussion with patient.  Discussed not starting any medication, following up in 1 month with PCP.  Discussed starting losartan 12.5 mg.  Care to mutual decision to start losartan 12.5 mg daily.  Patient to follow-up in around 1 month with new PCP for recheck.   No orders of the defined types were placed in this encounter.   Meds ordered this encounter  Medications  . losartan (COZAAR) 25 MG tablet    Sig: Take 0.5 tablets (12.5 mg total) by mouth daily.    Dispense:  30 tablet    Refill:  0   Guadalupe Dawn MD PGY-3 Family Medicine Resident  01/25/2019 3:17 PM

## 2019-02-04 DIAGNOSIS — M059 Rheumatoid arthritis with rheumatoid factor, unspecified: Secondary | ICD-10-CM | POA: Diagnosis not present

## 2019-02-11 ENCOUNTER — Other Ambulatory Visit: Payer: Self-pay | Admitting: Student in an Organized Health Care Education/Training Program

## 2019-02-11 DIAGNOSIS — E119 Type 2 diabetes mellitus without complications: Secondary | ICD-10-CM

## 2019-02-12 ENCOUNTER — Telehealth (INDEPENDENT_AMBULATORY_CARE_PROVIDER_SITE_OTHER): Payer: Medicare Other | Admitting: Family Medicine

## 2019-02-12 ENCOUNTER — Other Ambulatory Visit: Payer: Self-pay

## 2019-02-12 DIAGNOSIS — B373 Candidiasis of vulva and vagina: Secondary | ICD-10-CM

## 2019-02-12 DIAGNOSIS — R197 Diarrhea, unspecified: Secondary | ICD-10-CM | POA: Diagnosis not present

## 2019-02-12 DIAGNOSIS — B3731 Acute candidiasis of vulva and vagina: Secondary | ICD-10-CM

## 2019-02-12 MED ORDER — FLUCONAZOLE 150 MG PO TABS: 150.0000 mg | ORAL_TABLET | ORAL | 0 refills | Status: AC

## 2019-02-12 NOTE — Progress Notes (Signed)
Virtual Visit via Telephone  I connected with Michelle [name] on 02/12/19 at  2:45 PM EDT by a telephone and verify that I am speaking with the correct person.  Location: Patient: Home  Provider: [facility]    I discussed the limitations of evaluation and management by telephone and the availability of in person appointments. The patient expressed understanding and agreed to proceed.  History of Present Illness: Patient reports that she has been having foul-smelling diarrhea for 2 weeks.  Patient reports that every time she eats or drinks she has bowel a loose movement, with between 2-15 occurrences of loose stools per day. Her stools are described as "water-like" and "most of the times, it is very soft with brown color". Patient denies bloody or or black appearing stools. [name] states that she has been under increased stress over the last few weeks as she has buried two very close family members within 2 weeks of one another and thought this emotional distress was contributing to this ongoing diarrhea. Patient states that she only experiences abdominal pain just prior to needing to use the restroom but is otherwise comfortable in between episodes. Patient reports history of C. Difficile and being treated with antibiotics for a long period of time. Patient denies any new medications or recent antibiotics. When asked about metformin, reports that she has been taking this for years. Patient denies emesis, consuming undercooked meats, egg or mayonnaise based foods that were improperly refrigerated, denies attendings large gatherings where food was served as well as new dietary changes. She has attempted to eat and drinks less to help decrease the episodes of diarrhea. Only alleviating factor has been taking imodium which decreased her daily episodes to 4.  Denies fevers, chills, does not usually run fever, normal temp is 96, no hematuria, +dysuria with vaginal itching, denies  discharge. Denies any sick contacts or household members with diarrhea.  Denies recent illness,COVID-19 denies contacts Patient reports being diagnosed with IBS years ago but reports having more problems with constipation than diarrhea at that time.  Denies dizziness with standing from sitting   Patient is also expressing concern for elevated CBG of 203 this morning which is abnormal for her. Patient reported repeating her blood glucose with a measurement of 140. Patient reports that she has been taking her medications as prescribed and has been eating less as she has been having increased diarrhea after meals.   She also reports having a  yeast infection with continuous pruritis and no vaginal discharge. Patient requests prescription for Diflucan as she has yeast infections frequently.    Observations/Objective: Physical exam able to be completed due to nature of visit.  Assessment and Plan:  Diarrhea for 2 weeks:  Patient reports watery, foul-smelling, non-bloody diarrhea with 2-15 episodes per day for two weeks as well a history of C. Difficile. Patient denies recent antibiotic use but is noted to be on Remicade and Arava.  Patient counseled that with history of C. Difficile and her current DM and immunosuppressing medications, it is important for her to be evaluated and to have a stool sample collected to test for C.difficile and be subsequently treated if necessary. Patient was scheduled for an appointment on Monday 02/15/19 at 8:30 AM. Patient was in agreement with this plan.  -scheduled in office visit for 02/15/19  -collect stool sample and test for C. Difficile   Vaginal Candidiasis  Patient reports vaginal pruritus with no discharge.  Patient requests prescription for Diflucan -Prescribed Diflucan 500  mg   Follow Up Instructions: Please attend appointment on 02/15/2019 at 8:30 AM at family medicine center   I discussed the assessment and treatment plan with the patient. The  patient was provided an opportunity to ask questions and all were answered. The patient agreed with the plan and demonstrated an understanding of the instructions.   The patient was advised to call back or seek an evaluation in the emergency department if the symptoms worsen.   I provided 35 minutes of non-face-to-face time during this encounter.   Stark Klein, MD

## 2019-02-12 NOTE — Progress Notes (Signed)
145# BP- 150 80 T- [place], [place] - 1050 

## 2019-02-15 ENCOUNTER — Encounter: Payer: Self-pay | Admitting: Family Medicine

## 2019-02-16 ENCOUNTER — Ambulatory Visit (INDEPENDENT_AMBULATORY_CARE_PROVIDER_SITE_OTHER): Payer: Medicare Other | Admitting: Family Medicine

## 2019-02-16 ENCOUNTER — Other Ambulatory Visit: Payer: Self-pay

## 2019-02-16 ENCOUNTER — Encounter: Payer: Self-pay | Admitting: Family Medicine

## 2019-02-16 VITALS — BP 126/62 | HR 91 | Wt 147.6 lb

## 2019-02-16 DIAGNOSIS — E860 Dehydration: Secondary | ICD-10-CM

## 2019-02-16 DIAGNOSIS — K580 Irritable bowel syndrome with diarrhea: Secondary | ICD-10-CM | POA: Diagnosis not present

## 2019-02-16 DIAGNOSIS — R197 Diarrhea, unspecified: Secondary | ICD-10-CM

## 2019-02-16 MED ORDER — CLOTRIMAZOLE 1 % VA CREA: 1.0000 | TOPICAL_CREAM | Freq: Every day | VAGINAL | 0 refills | Status: DC

## 2019-02-16 NOTE — Patient Instructions (Signed)
It was great seeing you again today!  Your been having some trouble with your diarrhea.  We will get a stool sample to check for variety of causes.  There may be some component of stress related diarrhea secondary to grief after losing her family members.  Again I am sorry to hear about all that.  I will get in touch with our behavioral health worker and she will give you call.

## 2019-02-17 DIAGNOSIS — R197 Diarrhea, unspecified: Secondary | ICD-10-CM | POA: Diagnosis not present

## 2019-02-17 LAB — COMPREHENSIVE METABOLIC PANEL
ALT: 26 IU/L (ref 0–32)
AST: 35 IU/L (ref 0–40)
Albumin/Globulin Ratio: 2.1 (ref 1.2–2.2)
Albumin: 4.6 g/dL (ref 3.8–4.8)
Alkaline Phosphatase: 107 IU/L (ref 39–117)
BUN/Creatinine Ratio: 13 (ref 12–28)
BUN: 7 mg/dL — ABNORMAL LOW (ref 8–27)
Bilirubin Total: 0.4 mg/dL (ref 0.0–1.2)
CO2: 23 mmol/L (ref 20–29)
Calcium: 9.5 mg/dL (ref 8.7–10.3)
Chloride: 99 mmol/L (ref 96–106)
Creatinine, Ser: 0.52 mg/dL — ABNORMAL LOW (ref 0.57–1.00)
GFR calc Af Amer: 114 mL/min/{1.73_m2} (ref >59)
GFR calc non Af Amer: 98 mL/min/{1.73_m2} (ref >59)
Globulin, Total: 2.2 g/dL (ref 1.5–4.5)
Glucose: 149 mg/dL — ABNORMAL HIGH (ref 65–99)
Potassium: 4.5 mmol/L (ref 3.5–5.2)
Sodium: 138 mmol/L (ref 134–144)
Total Protein: 6.8 g/dL (ref 6.0–8.5)

## 2019-02-18 DIAGNOSIS — Z23 Encounter for immunization: Secondary | ICD-10-CM | POA: Diagnosis not present

## 2019-02-22 ENCOUNTER — Telehealth: Payer: Self-pay | Admitting: Family Medicine

## 2019-02-22 DIAGNOSIS — L720 Epidermal cyst: Secondary | ICD-10-CM | POA: Diagnosis not present

## 2019-02-22 DIAGNOSIS — H019 Unspecified inflammation of eyelid: Secondary | ICD-10-CM | POA: Diagnosis not present

## 2019-02-22 DIAGNOSIS — L853 Xerosis cutis: Secondary | ICD-10-CM | POA: Diagnosis not present

## 2019-02-22 DIAGNOSIS — Z1283 Encounter for screening for malignant neoplasm of skin: Secondary | ICD-10-CM | POA: Diagnosis not present

## 2019-02-22 DIAGNOSIS — Z85828 Personal history of other malignant neoplasm of skin: Secondary | ICD-10-CM | POA: Diagnosis not present

## 2019-02-22 DIAGNOSIS — L578 Other skin changes due to chronic exposure to nonionizing radiation: Secondary | ICD-10-CM | POA: Diagnosis not present

## 2019-02-22 DIAGNOSIS — L821 Other seborrheic keratosis: Secondary | ICD-10-CM | POA: Diagnosis not present

## 2019-02-22 NOTE — Progress Notes (Signed)
   HPI 68 year old female who presents for diarrhea.  Patient has had roughly 2 to 3-week period of watery brown stools.  States that she has been going from 4-6 times per day.  No associated pain with defecation.  Has a history of blood in the stool.  Patient has had the death of multiple family members the last month.  She states that her symptoms started after this. States that she has a remote history of what she thinks is an H. pylori infection which was treated with antibiotics.  This was diagnosed via biopsies during endoscopy.    CC: diarrhea   ROS:   Review of Systems See HPI for ROS.   CC, SH/smoking status, and VS noted  Objective: BP 126/62   Pulse 91   Wt 147 lb 9.6 oz (67 kg)   SpO2 95%   BMI 26.15 kg/m  Gen: 68 year old female, no acute distress, resting comfortably CV: RRR, no murmur Resp: CTAB, no wheezes, non-labored Abd: SNTND, BS present, no guarding or organomegaly Neuro: Alert and oriented, Speech clear, No gross deficits   Assessment and plan:  Irritable bowel Patient with 2 to 3-week history of diarrhea following the death of multiple family members.  Stressed this likely playing a role.  Testing for H. pylori, GI panel, and C. difficile.  Will refer to behavioral health for further management.  Follow-up with PCP.   Orders Placed This Encounter  Procedures  . H. pylori antigen, stool  . Comprehensive metabolic panel    Order Specific Question:   Has the patient fasted?    Answer:   No  . GI Profile, Stool, PCR  . Specimen status report    Meds ordered this encounter  Medications  . clotrimazole (GYNE-LOTRIMIN) 1 % vaginal cream    Sig: Place 1 Applicatorful vaginally at bedtime.    Dispense:  45 g    Refill:  0     Guadalupe Dawn MD PGY-3 Family Medicine Resident  02/22/2019 11:23 PM

## 2019-02-22 NOTE — Telephone Encounter (Signed)
Called and informed patient that we now have the proper collection container for the GI profile. She said she will come by in the morning to pick it up. Michelle [name] Marva Hendryx

## 2019-02-22 NOTE — Telephone Encounter (Signed)
Please let the patient know that her kidney and liver function look great, without any problems. Her H Pylori came back negative. There appears to have been a lab error with the cdiff and gi panel as these have no resulted and [facility] says that they did not receive the proper sample. If she is still having diarrhea, I recommend follow up with her pcp.   Guadalupe Dawn MD PGY-3 Family Medicine Resident

## 2019-02-22 NOTE — Assessment & Plan Note (Signed)
Patient with 2 to 3-week history of diarrhea following the death of multiple family members.  Stressed this likely playing a role.  Testing for H. pylori, GI panel, and C. difficile.  Will refer to behavioral health for further management.  Follow-up with PCP.

## 2019-02-22 NOTE — Telephone Encounter (Signed)
Called patient with lab results.  Patient is awaiting call from lab stating that the proper materials are available now/  Spoke to [place] and he will call patient to discuss if she wants to come in prior to her next appointment 03/05/2019.  Ozella Almond, [place]

## 2019-02-23 LAB — SPECIMEN STATUS REPORT

## 2019-02-23 LAB — H. PYLORI ANTIGEN, STOOL: H pylori Ag, Stl: NEGATIVE

## 2019-02-24 DIAGNOSIS — R197 Diarrhea, unspecified: Secondary | ICD-10-CM | POA: Diagnosis not present

## 2019-02-24 NOTE — Addendum Note (Signed)
Addended by: Londell Moh T on: 02/24/2019 11:38 AM   Modules accepted: Orders

## 2019-02-27 LAB — GI PROFILE, STOOL, PCR

## 2019-03-01 ENCOUNTER — Telehealth: Payer: Self-pay | Admitting: Family Medicine

## 2019-03-01 NOTE — Telephone Encounter (Signed)
Please let the patient know that her gi stool sample came back negative for any tested bacterial or viral causes of her diarrhea (this includes c diff). Looks like she has a visit with dr. Jeannine Kitten on 9/4. Would encourage her to keep that appointment.  Guadalupe Dawn MD PGY-3 Family Medicine Resident

## 2019-03-01 NOTE — Telephone Encounter (Signed)
Pt informed. Deseree Blount, CMA  

## 2019-03-02 DIAGNOSIS — L7211 Pilar cyst: Secondary | ICD-10-CM | POA: Diagnosis not present

## 2019-03-02 DIAGNOSIS — L720 Epidermal cyst: Secondary | ICD-10-CM | POA: Diagnosis not present

## 2019-03-04 ENCOUNTER — Ambulatory Visit: Payer: Medicare Other | Admitting: Sports Medicine

## 2019-03-04 DIAGNOSIS — L720 Epidermal cyst: Secondary | ICD-10-CM | POA: Diagnosis not present

## 2019-03-04 DIAGNOSIS — Z4802 Encounter for removal of sutures: Secondary | ICD-10-CM | POA: Diagnosis not present

## 2019-03-05 ENCOUNTER — Encounter: Payer: Self-pay | Admitting: Family Medicine

## 2019-03-05 ENCOUNTER — Other Ambulatory Visit: Payer: Self-pay

## 2019-03-05 ENCOUNTER — Ambulatory Visit (INDEPENDENT_AMBULATORY_CARE_PROVIDER_SITE_OTHER): Payer: Medicare Other | Admitting: Family Medicine

## 2019-03-05 VITALS — BP 116/60 | HR 67 | Wt 148.0 lb

## 2019-03-05 DIAGNOSIS — G894 Chronic pain syndrome: Secondary | ICD-10-CM | POA: Diagnosis not present

## 2019-03-05 DIAGNOSIS — E785 Hyperlipidemia, unspecified: Secondary | ICD-10-CM | POA: Diagnosis not present

## 2019-03-05 DIAGNOSIS — K219 Gastro-esophageal reflux disease without esophagitis: Secondary | ICD-10-CM | POA: Diagnosis not present

## 2019-03-05 DIAGNOSIS — E119 Type 2 diabetes mellitus without complications: Secondary | ICD-10-CM

## 2019-03-05 DIAGNOSIS — M069 Rheumatoid arthritis, unspecified: Secondary | ICD-10-CM | POA: Diagnosis not present

## 2019-03-05 LAB — POCT GLYCOSYLATED HEMOGLOBIN (HGB A1C): HbA1c, POC (controlled diabetic range): 8.6 % — AB (ref 0.0–7.0)

## 2019-03-05 MED ORDER — FAMOTIDINE 20 MG PO TABS: 20.0000 mg | ORAL_TABLET | Freq: Two times a day (BID) | ORAL | 5 refills | Status: DC

## 2019-03-05 MED ORDER — FAMOTIDINE 40 MG PO TABS: 40.0000 mg | ORAL_TABLET | Freq: Every day | ORAL | 5 refills | Status: DC

## 2019-03-05 NOTE — Progress Notes (Signed)
[facility] Phone: [phone]     Michelle [name] - 68 y.o. female MRN [id number]  Date of birth: 07-28-1950  Subjective:   cc: GERD, diabetes, RA  HPI:  GERD: has been taking omeprazole. She wants to try a different medication because she is Over 38 and has osteoporosis and is worried about side effects.  Took two days off of her medication and felt terrible.    Diabetes:  Says she knows she has not been eating well lately. Willing to cut down on biscuits at dinner and sweets. She is willing to cut down on her sweet tea intake of two glasses a day.    RA:  She has been taking her percocet as prescribed every six hours. Infusion of remicade every 8 weeks as well as arava daily. Pain in her hands has been worse lately.   Her recent stomach pains/diarrhea have improved.   HLD: patient states she is taking her crestor daily.  Is okay with a lipid panel toay.   ROS: See HPI for pertinent positives and negatives  Past Medical History  Family history reviewed for today's visit. No changes.  Health Maintenance:  -  Health Maintenance Due  Topic Date Due  . FOOT EXAM  12/27/2017  . OPHTHALMOLOGY EXAM  11/14/2018    -  reports that she quit smoking about 24 years ago. Her smoking use included cigarettes. She has a 7.50 pack-year smoking history. She has never used smokeless tobacco.  Objective:   BP 116/60   Pulse 67   Wt 148 lb (67.1 kg)   SpO2 95%   BMI 26.22 kg/m  Gen: NAD, alert and oriented, cooperative with exam CV: normal rate, regular rhythm. No murmurs, no rubs.  Resp: LCTAB, no wheezes, crackles. normal work of breathing Msk: no TTP or swelling of the interphalangeal joints.   Neuro: CN II-XII grossly intact. no gross deficits Skin: No rashes, no lesions Psych: Appropriate behavior  Assessment/Plan:   Diabetes mellitus without complication (HCC) 123456 increased today.  8.6%.  Previously has been between 7-8.  Would need to start  another medication as she has maxed out her metformin and her trulicity.  Patient willing to try improving diet and weight loss and agreed to start a new medication if a1c has not improved in 3 months.    Hyperlipidemia Taking crestor daily.  Will get lipid panel today as she has not had one in two years.   GERD Patient would like to try another medication other than PPI.   - stop omeprazole - start pepcid 40mg  daily  Rheumatoid arthritis [facility]) Patient feels she is doing well on the current dose, although her pain in her hands is slightly worse.  - refilled percocet for 3 29-month prescriptions.    Orders Placed This Encounter  Procedures  . Lipid Panel  . HgB A1c    Meds ordered this encounter  Medications  . DISCONTD: famotidine (PEPCID) 20 MG tablet    Sig: Take 1 tablet (20 mg total) by mouth 2 (two) times daily.    Dispense:  60 tablet    Refill:  5  . famotidine (PEPCID) 40 MG tablet    Sig: Take 1 tablet (40 mg total) by mouth daily.    Dispense:  90 tablet    Refill:  5  . oxyCODONE-acetaminophen (PERCOCET) 10-325 MG tablet    Sig: Take 1 tablet by mouth every 6 (six) hours as needed for pain.  Dispense:  120 tablet    Refill:  0    Fill on or after 05/19/19  . oxyCODONE-acetaminophen (PERCOCET) 10-325 MG tablet    Sig: Take 1 tablet by mouth every 6 (six) hours as needed for pain.    Dispense:  120 tablet    Refill:  0    Fill on or after 04/18/2019  . oxyCODONE-acetaminophen (PERCOCET) 10-325 MG tablet    Sig: Take 1 tablet by mouth every 6 (six) hours as needed for pain.    Dispense:  120 tablet    Refill:  0    Fill on or after 03/19/2019     Clemetine Marker, MD PGY-2 [facility]

## 2019-03-05 NOTE — Patient Instructions (Signed)
I would like to check on your blood sugar again in three months before I start you on another medication.    If your lipid panel test is abnormal I will call you with the results.    Please take the pepcid once a day as prescribed.    I will see you in three months.    Clemetine Marker, MD

## 2019-03-06 LAB — LIPID PANEL
Chol/HDL Ratio: 2.8 ratio (ref 0.0–4.4)
Cholesterol, Total: 100 mg/dL (ref 100–199)
HDL: 36 mg/dL — ABNORMAL LOW (ref >39)
LDL Chol Calc (NIH): 40 mg/dL (ref 0–99)
Triglycerides: 140 mg/dL (ref 0–149)
VLDL Cholesterol Cal: 24 mg/dL (ref 5–40)

## 2019-03-07 MED ORDER — OXYCODONE-ACETAMINOPHEN 10-325 MG PO TABS: 1.0000 | ORAL_TABLET | Freq: Four times a day (QID) | ORAL | 0 refills | Status: DC | PRN

## 2019-03-07 NOTE — Assessment & Plan Note (Addendum)
A1c increased today.  8.6%.  Previously has been between 7-8.  Would need to start another medication as she has maxed out her metformin and her trulicity.  Patient willing to try improving diet and weight loss and agreed to start a new medication if a1c has not improved in 3 months.

## 2019-03-07 NOTE — Assessment & Plan Note (Signed)
Patient would like to try another medication other than PPI.   - stop omeprazole - start pepcid 40mg  daily

## 2019-03-07 NOTE — Assessment & Plan Note (Signed)
Taking crestor daily.  Will get lipid panel today as she has not had one in two years.

## 2019-03-07 NOTE — Assessment & Plan Note (Signed)
Patient feels she is doing well on the current dose, although her pain in her hands is slightly worse.  - refilled percocet for 3 30-month prescriptions.

## 2019-03-09 ENCOUNTER — Encounter: Payer: Self-pay | Admitting: Family Medicine

## 2019-03-12 ENCOUNTER — Other Ambulatory Visit: Payer: Self-pay | Admitting: Family Medicine

## 2019-03-12 DIAGNOSIS — E119 Type 2 diabetes mellitus without complications: Secondary | ICD-10-CM

## 2019-03-15 ENCOUNTER — Other Ambulatory Visit: Payer: Self-pay | Admitting: Family Medicine

## 2019-03-16 ENCOUNTER — Telehealth: Payer: Self-pay | Admitting: *Deleted

## 2019-03-16 NOTE — Telephone Encounter (Signed)
Pt states that the pepcid is not working like the omeprazole did.  States that she is vomiting after every meal. Christen Bame, CMA

## 2019-03-17 ENCOUNTER — Ambulatory Visit (INDEPENDENT_AMBULATORY_CARE_PROVIDER_SITE_OTHER): Payer: Medicare Other | Admitting: Gastroenterology

## 2019-03-17 ENCOUNTER — Encounter: Payer: Self-pay | Admitting: Neurology

## 2019-03-17 ENCOUNTER — Encounter: Payer: Self-pay | Admitting: Gastroenterology

## 2019-03-17 ENCOUNTER — Other Ambulatory Visit: Payer: Self-pay | Admitting: Family Medicine

## 2019-03-17 VITALS — BP 130/70 | HR 88 | Temp 98.4°F | Ht 63.0 in | Wt 144.0 lb

## 2019-03-17 DIAGNOSIS — R197 Diarrhea, unspecified: Secondary | ICD-10-CM

## 2019-03-17 DIAGNOSIS — R1013 Epigastric pain: Secondary | ICD-10-CM

## 2019-03-17 MED ORDER — CHOLESTYRAMINE 4 G PO PACK: 4.0000 g | PACK | Freq: Every day | ORAL | 2 refills | Status: DC

## 2019-03-17 MED ORDER — OMEPRAZOLE 40 MG PO CPDR: 40.0000 mg | DELAYED_RELEASE_CAPSULE | Freq: Two times a day (BID) | ORAL | 3 refills | Status: DC

## 2019-03-17 NOTE — Patient Instructions (Signed)
We have sent the following medications to your pharmacy for you to pick up at your convenience:Questran to take daily at lunch time.   Call in 4 weeks with an update. Please ask for the nurse Koren Shiver.

## 2019-03-17 NOTE — Progress Notes (Signed)
Switched back to omeprazole from prilosec as patient was not working

## 2019-03-17 NOTE — Progress Notes (Signed)
03/17/2019 Michelle [name] [id number] 1950-07-07   HISTORY OF PRESENT ILLNESS: This is a 68 year old female who is a patient of Dr. Ardis Hughs.  She had both EGD and colonoscopy in October 2019.  Colonoscopy revealed one small polyp that was removed, was a tubular adenoma, and diverticulosis.  EGD revealed mild nonspecific gastritis and a small hiatal hernia.  Gastric biopsies showed slight chronic inflammation, negative for H. Pylori.  She is here today with complaints of diarrhea.  She says that this began over 4 weeks ago.  She says that the diarrhea occurs mostly after she eats.  PCP ordered stool GI pathogen panel, which was negative.  TSH earlier this year was within normal limits.  She says that she has been told in the past that she has IBS.  She said that she has had issues over the years previously with some cycles of alternating constipation and diarrhea.  Does not have a gallbladder.  No blood in the stool.  Also reports epigastric abdominal pain, but says that her PCP had stopped her omeprazole since she had been on it for so long and was concerned about it contributing to osteoporosis.  Has been off of if for maybe close to a month.  She has been taking pepcid instead.  She called her PCP and says that they just called her back this AM and said to restart the omeprazole.  Has a lot of healthcare related anxiety and wanted to make sure she does not have any "cancer" anywhere.   Past Medical History:  Diagnosis Date  . Allergy   . Anxiety   . Asthma   . Cancer ([place])    skin CA  . Cataract    beginning on left eye  . Depression   . Diabetes mellitus    Type II  . Diverticulosis   . GERD (gastroesophageal reflux disease)   . HLD (hyperlipidemia)   . Hypertension   . Lung nodules   . Osteoporosis   . RA (rheumatoid arthritis) (HCC)    CCP positive followed by [facility]   Past Surgical History:  Procedure Laterality Date  . ABDOMINAL HYSTERECTOMY    . APPENDECTOMY    .  CARPAL TUNNEL RELEASE Right   . CATARACT EXTRACTION Left    with lid lift  . CHOLECYSTECTOMY    . COLONOSCOPY    . HERNIA REPAIR    . KNEE SURGERY Right    x2- cartilage annd scar tissue  . LEFT AND RIGHT HEART CATHETERIZATION WITH CORONARY ANGIOGRAM N/A 08/17/2013   Procedure: LEFT AND RIGHT HEART CATHETERIZATION WITH CORONARY ANGIOGRAM;  Surgeon: Laverda Page, MD;  Location: [facility] CATH LAB;  Service: Cardiovascular;  Laterality: N/A;  . lumbar back surgery  04/04/2018   done by neurosurgeron  . right foot bone spurs removed    . ROTATOR CUFF REPAIR Right   . SHOULDER SURGERY Left    x2- rotatar cuff tear  . UPPER GASTROINTESTINAL ENDOSCOPY      reports that she quit smoking about 24 years ago. Her smoking use included cigarettes. She has a 7.50 pack-year smoking history. She has never used smokeless tobacco. She reports that she does not drink alcohol or use drugs. family history includes COPD in her sister; Cancer in her sister; Colon cancer in her daughter and maternal aunt; Lung cancer (age of onset: 57) in her brother; Rheum arthritis (age of onset: 37) in her sister; Stroke (age of onset: 70) in her father. Allergies  Allergen Reactions  . Zofran [Ondansetron Hcl] Other (See Comments)    Prolong QT- pt unsure of this allergy  . Ace Inhibitors Cough    Occurred with liinopril  . Levofloxacin Other (See Comments)    Felt things on her legs that were not there,made her stomach hurt  . Lisinopril Cough  . Sulfonamide Derivatives Hives and Itching      Outpatient Encounter Medications as of 03/17/2019  Medication Sig  . aspirin EC 81 MG tablet Take 1 tablet (81 mg total) by mouth daily.  Marland Kitchen azelastine (OPTIVAR) 0.05 % ophthalmic solution Place 1 drop into both eyes 2 (two) times daily as needed (itchy eyes.).   Marland Kitchen baclofen (LIORESAL) 10 MG tablet Take 1 tablet (10 mg total) by mouth 3 (three) times daily as needed for muscle spasms.  . budesonide-formoterol (SYMBICORT) 160-4.5  MCG/ACT inhaler Inhale 2 puffs into the lungs 2 (two) times daily.  . cetirizine (EQ ALLERGY RELIEF, CETIRIZINE,) 10 MG tablet Take 1 tablet (10 mg total) by mouth at bedtime.  . clotrimazole (GYNE-LOTRIMIN) 1 % vaginal cream Place 1 Applicatorful vaginally at bedtime.  . diclofenac sodium (VOLTAREN) 1 % GEL Apply 2 g topically 4 (four) times daily as needed (for pain).  . ferrous sulfate 325 (65 FE) MG tablet Take 1 tablet (325 mg total) by mouth daily with breakfast.  . fluconazole (DIFLUCAN) 150 MG tablet TAKE 1 TABLET BY MOUTH EVERY THIRD DAY. FOR THREE DOSES  . fluticasone (FLONASE) 50 MCG/ACT nasal spray Place 2 sprays into both nostrils daily as needed for allergies.  Marland Kitchen gabapentin (NEURONTIN) 300 MG capsule Take 1 capsule by mouth at bedtime  . glucose blood (ACCU-CHEK AVIVA) test strip Use to check blood sugar three times daily or as directed. Dx code: E11.9  . inFLIXimab (REMICADE) 100 MG injection Inject 100 mg into the vein every 7 (seven) weeks.   Marland Kitchen leflunomide (ARAVA) 10 MG tablet Take 10 mg by mouth daily.  Marland Kitchen losartan (COZAAR) 25 MG tablet Take 1/2 (one-half) tablet by mouth once daily  . metFORMIN (GLUCOPHAGE) 1000 MG tablet Take 1 tablet (1,000 mg total) by mouth 2 (two) times daily with a meal.  . mometasone (ELOCON) 0.1 % cream APPLY CREAM TOPICALLY TO AFFECTED AREA TWICE DAILY. FACE AND ARMS  . nitroGLYCERIN (NITROSTAT) 0.4 MG SL tablet Place 1 tablet (0.4 mg total) under the tongue every 5 (five) minutes as needed.  Marland Kitchen omeprazole (PRILOSEC) 40 MG capsule Take 1 capsule (40 mg total) by mouth 2 (two) times daily.  Derrill Memo ON 05/19/2019] oxyCODONE-acetaminophen (PERCOCET) 10-325 MG tablet Take 1 tablet by mouth every 6 (six) hours as needed for pain.  Derrill Memo ON 04/18/2019] oxyCODONE-acetaminophen (PERCOCET) 10-325 MG tablet Take 1 tablet by mouth every 6 (six) hours as needed for pain.  Derrill Memo ON 03/19/2019] oxyCODONE-acetaminophen (PERCOCET) 10-325 MG tablet Take 1 tablet by  mouth every 6 (six) hours as needed for pain.  . Pramoxine HCl 1 % CREA Apply 1 Dose topically as directed.  . primidone (MYSOLINE) 50 MG tablet TAKE 2 TABLETS BY MOUTH ONCE DAILY IN THE MORNING AND 1 IN THE EVENING  . rosuvastatin (CRESTOR) 10 MG tablet Take 1 tablet (10 mg total) by mouth daily. (Patient taking differently: Take 10 mg by mouth at bedtime. )  . sertraline (ZOLOFT) 50 MG tablet Take 1 tablet (50 mg total) by mouth daily.  . solifenacin (VESICARE) 5 MG tablet Take 5 mg by mouth daily.  . TRULICITY 1.5 0000000 SOPN  INJECT 1.5 MG INTO THE SKIN ONCE A WEEK  . VENTOLIN HFA 108 (90 Base) MCG/ACT inhaler INHALE 2 PUFFS BY MOUTH EVERY 4 HOURS AS NEEDED FOR WHEEZING FOR SHORTNESS OF BREATH  . [DISCONTINUED] cyclobenzaprine (FLEXERIL) 10 MG tablet Take 10 mg by mouth 3 (three) times daily.  . [DISCONTINUED] famotidine (PEPCID) 40 MG tablet Take 1 tablet (40 mg total) by mouth daily.  . [DISCONTINUED] fluconazole (DIFLUCAN) 150 MG tablet Take 1 tablet (150 mg total) by mouth daily. (Patient not taking: Reported on 03/05/2019)  . [DISCONTINUED] omeprazole (PRILOSEC) 40 MG capsule Take 1 capsule (40 mg total) by mouth 2 (two) times daily. (Patient taking differently: Take 40 mg by mouth daily. )  . [DISCONTINUED] predniSONE (DELTASONE) 10 MG tablet Take as directed per MD instructions (Patient not taking: Reported on 03/05/2019)  . [DISCONTINUED] predniSONE (DELTASONE) 5 MG tablet TAKE 4 TABLETS BY MOUTH DAILY FOR 1 WEEK THEN TAKE 3 TABLETS BY MOUTH DAILY FOR 1 WEEK THEN 2 TABLETS BY MOUTH DAILY FOR 1 WEEK THEN 1 TABLE   No facility-administered encounter medications on file as of 03/17/2019.      REVIEW OF SYSTEMS  : All other systems reviewed and negative except where noted in the History of Present Illness.   PHYSICAL EXAM: BP 130/70   Pulse 88   Temp 98.4 F (36.9 C)   Ht 5\' 3"  (1.6 m)   Wt 144 lb (65.3 kg)   BMI 25.51 kg/m  General: Well developed white female in no acute distress  Head: Normocephalic and atraumatic Eyes:  Sclerae anicteric, conjunctiva pink. Ears: Normal auditory acuity Lungs: Clear throughout to auscultation; no increased WOB Heart: Regular rate and rhythm; no M/R/G. Abdomen: Soft, non-distended.  BS present.  Mild epigastric TTP. Musculoskeletal: Symmetrical with no gross deformities  Skin: No lesions on visible extremities Extremities: No edema  Neurological: Alert oriented x 4, grossly non-focal Psychological:  Alert and cooperative. Normal mood and affect  ASSESSMENT AND PLAN: *Diarrhea, mostly post-prandial:  For about 4 weeks at present but has had issues with alternating diarrhea and constipation in the past--was told previously that she probably has IBS.  This diarrhea may be mutifactorial as she is on several medications as well, some of which can have side effects of diarrhea, specifically Metformin and Trulicity.  Also does not have a gallbladder so could be some bile salt related as well since it occurs mostly after eating.  I am going to start her on Questran 1 packet daily, which she will take at lunchtime since she does not take any other medications at that time.  Recent stool studies were negative. *Epigastric abdominal pain: Her omeprazole had been discontinued by her PCP and she was placed on Pepcid instead.  Now with epigastric pain.  Her her PCP contacted her this morning and told her to restart her omeprazole.  We will see how she does with this and hopefully her epigastric pain will improve or resolve with resuming PPI therapy.  **I have asked her to call back here and give [place] an update on her symptoms in about 4 weeks.   CC:  [name], MD

## 2019-03-18 ENCOUNTER — Telehealth: Payer: Self-pay

## 2019-03-18 NOTE — Telephone Encounter (Signed)
There is no other option unless the powder that is not in the packet form is a different flavor.  Is there any way to find out?  The only other thing would be colestid pills but I find that it does not work as well sometimes.  Thank you,  Jess

## 2019-03-18 NOTE — Progress Notes (Signed)
I agree with the above note, plan 

## 2019-03-18 NOTE — Telephone Encounter (Signed)
Received fax from pharmacy stating patient would like a alternative to the cholestyramine packets. Patient states she does not like orange flavoring and thats the only flavor that they have for cholestyramine. Please advise Janett Billow.

## 2019-03-19 NOTE — Telephone Encounter (Signed)
That is fine so just save to chart.  Thank you,  Jess

## 2019-03-19 NOTE — Telephone Encounter (Signed)
Informed patient that we can try to send in the powder version or another similar medication called Colestid but may not work as well. Patient states she actually wanted me to let Alonza Bogus, PA know that she has had no recurrent diarrhea symptoms since starting omeprazole and does not want to pick up any medications right now. She also will contact our office if she decides to would like the prescription. So Michelle [name].

## 2019-03-19 NOTE — Progress Notes (Signed)
Subjective:   Michelle [name] was seen in consultation in the movement disorder clinic at the request of [name], MD.  The evaluation is for tremor.  She is accompanied by her granddaughter who supplements the history.  Pt is a 68 y.o. female with hx of chronic pain, RA, anxiety, DM who presents with c/o tremor.  Tremor started approximately a year ago and involves the L hand.  She notes it when carrying something like a plate.  Pt was on prednisone for RA recently but tremor is independent of the prednisone.     Specific Symptoms:  Tremor: Yes.   Family hx of similar:  Yes.  , both mother and sister had tremor but no known dx Voice: "everyone says I talk too loud" Sleep: multiple awakenings to use the BR; she is sleepy during the day  Vivid Dreams:  No.  Acting out dreams:  rarely Wet Pillows: No. Postural symptoms:  Yes.    Falls?  No. but had near falls Bradykinesia symptoms: slow movements, drooling while awake and difficulty getting out of a chair Loss of smell:  No., better after septoplasty Loss of taste:  No. Urinary Incontinence:  Yes.  , urinary urgency and frequency Difficulty Swallowing:  No. Handwriting, micrographia: No. Trouble with ADL's:  No.  Trouble buttoning clothing: Yes.  , but because of RA Depression:  Yes.   Memory changes:  Yes.   Hallucinations:  No.  visual distortions: Yes.   N/V:  Yes.   (emesis with ice cream but attributes to GERD) Lightheaded:  Yes.    Syncope: No. Diplopia:  No. Dyskinesia:  No.  Neuroimaging has previously been performed.  It is available for my review today.  MRI done in 01/2013 Demonstrated scattered T2 hyperintensities, overall mild, and a developmental venous anomaly in the left frontal operculum.  05/28/17 update:  Pt seen in one year follow-up today. Tremor has been worse in the L hand and she is noting some chin/lip tremor.   I have reviewed records available to me since last visit.  She had a low B12 level done  in September. She has been following at [facility] for her cough variant asthma.  She states that she is worried about lung CA.    08/29/17 update: Patient is seen today in follow-up for essential tremor.  She is now on primidone, 50 mg daily.  She reports that tremor is good in the AM but wears off in the afternoon.  She will nearly throw it off of the plate.  Use of albuterol is variable but she doesn't think that it affects tremor.  She also c/o headache in the bilateral temporal region, pounding in quality.  Comes daily.  Better in the AM, worse in the afternoon.  Going on x 3 weeks.  No new medication in the last month.  Tried ibuprofen, tylenol, OTC "migraine" meds without relief.  Uses percocet every 6 hours from RA.  B12 was a little low and now on 1054mcg daily.  Records have been reviewed since our last visit.  She has been seen at [facility] rheumatology for rheumatoid arthritis.  She has been seen by family medicine.  Her last A1c in December, 2018 was 7.2.  02/03/18 update: Patient is seen today in follow-up for essential tremor.  Primidone was increased last visit to 50 mg twice per day.  She reports that she still has tremor but worse mid day.  She does think that the medication helps some.  Has occasional jaw tremor.  Records have been reviewed since our last visit.  She has been recently seen for chronic pain, including hip and back pain.  Having some bladder issues.  Tried what sounds like mybetriq and it worked but was too expensive so she is trying something else that isn't helping.  [name] one week ago.  She relates it to hip issues/back issues and wonders if leg gave out.  She found that PT caused more pain.  She is having injections next week.  She is seeing Dr. Kathyrn Sheriff and Dr. Maryjean Ka.    09/16/18 update: Patient is seen today in follow-up for essential tremor.  Primidone was increased last visit to 50 mg, 2 tablets in the morning and 1 at night but patient states that still only on 1 po bid.  Pt states  that she had gone up on it but "I cut it back because my memory is just gone."  She isn't sure it was from that but decided to cut back on that.  Also c/o balance loss.  Records are reviewed since last visit.  She had surgery in October for lumbar radiculopathy.  Patient has been seeing primary care at the family practice residency clinic.  She was started on donepezil.  The patient stopped this because she did not think it was beneficial.  It was restarted on September 04, 2018.  Moca was 26.  She is on narcotic medications (Percocet).  Had back sx in October and it helped but still has some pain/stiffness in the AM and told due to arthritis.    03/22/19 update: Patient seen today in follow-up for essential tremor.  She is supposed to be on primidone, 50 mg twice per day  - told me previously that she felt that higher dosages affect memory- but she realized that it didn't and went back to 50mg  - 2 in the AM and 1 at night.  It helped her tremor and memory didn't change. Happy with degree of tremor control.  She did have lab work done last visit.  She had a mild B12 deficiency.  I told her to restart her oral supplements, 1000 mcg daily.  She reports that she has started it.  She is seen at the resident clinic and those notes have been reviewed since our last visit.  She has been seen for hypertension.  She was also seen for diarrhea, which was felt stress-induced after the death of a family member.  However, it persisted and she ended up seeing the gastroenterology physician assistant.  It was felt to be multifactorial, perhaps due to her medications including metformin and Trulicity, as well as the fact that she had no gallbladder.  She was started on Questran.  When I saw her last visit, her primary care resident had started her on donepezil, but the patient has since stopped this.  She cannot state if diarrhea was associated with it but thinks that she went off of it for 5-6 months ago and the diarrhea persisted  after that.  She does state that her BS was really high and once she started controlling that the diarrhea got better.  In fact, she states she had a [employer] bar yesterday, and the diarrhea came back.  I did not think she had dementia anyway, but rather thought she had memory change due to her multiple medications, but especially the narcotic medications.  She is on chronic Percocet.   ALLERGIES:   Allergies  Allergen Reactions   Zofran [  Ondansetron Hcl] Other (See Comments)    Prolong QT- pt unsure of this allergy   Ace Inhibitors Cough    Occurred with liinopril   Levofloxacin Other (See Comments)    Felt things on her legs that were not there,made her stomach hurt   Lisinopril Cough   Sulfonamide Derivatives Hives and Itching    CURRENT MEDICATIONS:  Outpatient Encounter Medications as of 03/22/2019  Medication Sig   aspirin EC 81 MG tablet Take 1 tablet (81 mg total) by mouth daily.   azelastine (OPTIVAR) 0.05 % ophthalmic solution Place 1 drop into both eyes 2 (two) times daily as needed (itchy eyes.).    baclofen (LIORESAL) 10 MG tablet Take 1 tablet (10 mg total) by mouth 3 (three) times daily as needed for muscle spasms.   budesonide-formoterol (SYMBICORT) 160-4.5 MCG/ACT inhaler Inhale 2 puffs into the lungs 2 (two) times daily.   cetirizine (EQ ALLERGY RELIEF, CETIRIZINE,) 10 MG tablet Take 1 tablet (10 mg total) by mouth at bedtime.   clotrimazole (GYNE-LOTRIMIN) 1 % vaginal cream Place 1 Applicatorful vaginally at bedtime.   diclofenac sodium (VOLTAREN) 1 % GEL Apply 2 g topically 4 (four) times daily as needed (for pain).   ferrous sulfate 325 (65 FE) MG tablet Take 1 tablet (325 mg total) by mouth daily with breakfast.   fluticasone (FLONASE) 50 MCG/ACT nasal spray Place 2 sprays into both nostrils daily as needed for allergies.   FLUZONE HIGH-DOSE QUADRIVALENT 0.7 ML SUSY TO BE ADMINISTERED BY PHARMACIST FOR IMMUNIZATION   gabapentin (NEURONTIN) 300 MG  capsule Take 1 capsule by mouth at bedtime   glucose blood (ACCU-CHEK AVIVA) test strip Use to check blood sugar three times daily or as directed. Dx code: E11.9   inFLIXimab (REMICADE) 100 MG injection Inject 100 mg into the vein every 7 (seven) weeks.    leflunomide (ARAVA) 10 MG tablet Take 10 mg by mouth daily.   losartan (COZAAR) 25 MG tablet Take 1/2 (one-half) tablet by mouth once daily   metFORMIN (GLUCOPHAGE) 1000 MG tablet Take 1 tablet (1,000 mg total) by mouth 2 (two) times daily with a meal.   mometasone (ELOCON) 0.1 % cream APPLY CREAM TOPICALLY TO AFFECTED AREA TWICE DAILY. FACE AND ARMS   nitroGLYCERIN (NITROSTAT) 0.4 MG SL tablet Place 1 tablet (0.4 mg total) under the tongue every 5 (five) minutes as needed.   omeprazole (PRILOSEC) 40 MG capsule Take 1 capsule (40 mg total) by mouth 2 (two) times daily.   [START ON 05/19/2019] oxyCODONE-acetaminophen (PERCOCET) 10-325 MG tablet Take 1 tablet by mouth every 6 (six) hours as needed for pain.   [START ON 04/18/2019] oxyCODONE-acetaminophen (PERCOCET) 10-325 MG tablet Take 1 tablet by mouth every 6 (six) hours as needed for pain.   oxyCODONE-acetaminophen (PERCOCET) 10-325 MG tablet Take 1 tablet by mouth every 6 (six) hours as needed for pain.   Pramoxine HCl 1 % CREA Apply 1 Dose topically as directed.   primidone (MYSOLINE) 50 MG tablet TAKE 2 TABLETS BY MOUTH ONCE DAILY IN THE MORNING AND 1 IN THE EVENING   rosuvastatin (CRESTOR) 10 MG tablet Take 1 tablet (10 mg total) by mouth daily. (Patient taking differently: Take 10 mg by mouth at bedtime. )   sertraline (ZOLOFT) 50 MG tablet Take 1 tablet (50 mg total) by mouth daily.   solifenacin (VESICARE) 5 MG tablet Take 5 mg by mouth daily.   TRULICITY 1.5 0000000 SOPN INJECT 1.5 MG INTO THE SKIN ONCE A WEEK  VENTOLIN HFA 108 (90 Base) MCG/ACT inhaler INHALE 2 PUFFS BY MOUTH EVERY 4 HOURS AS NEEDED FOR WHEEZING FOR SHORTNESS OF BREATH   cholestyramine (QUESTRAN)  4 g packet Take 1 packet (4 g total) by mouth daily. (Patient not taking: Reported on 03/22/2019)   fluconazole (DIFLUCAN) 150 MG tablet TAKE 1 TABLET BY MOUTH EVERY THIRD DAY. FOR THREE DOSES   No facility-administered encounter medications on file as of 03/22/2019.     PAST MEDICAL HISTORY:   Past Medical History:  Diagnosis Date   Allergy    Anxiety    Asthma    Cancer ([place])    skin CA   Cataract    beginning on left eye   Depression    Diabetes mellitus    Type II   Diverticulosis    GERD (gastroesophageal reflux disease)    HLD (hyperlipidemia)    Hypertension    Lung nodules    Osteoporosis    RA (rheumatoid arthritis) ([place])    CCP positive followed by [facility]    PAST SURGICAL HISTORY:   Past Surgical History:  Procedure Laterality Date   ABDOMINAL HYSTERECTOMY     APPENDECTOMY     CARPAL TUNNEL RELEASE Right    CATARACT EXTRACTION Left    with lid lift   CHOLECYSTECTOMY     COLONOSCOPY     HERNIA REPAIR     KNEE SURGERY Right    x2- cartilage annd scar tissue   LEFT AND RIGHT HEART CATHETERIZATION WITH CORONARY ANGIOGRAM N/A 08/17/2013   Procedure: LEFT AND RIGHT HEART CATHETERIZATION WITH CORONARY ANGIOGRAM;  Surgeon: Laverda Page, MD;  Location: [facility] CATH LAB;  Service: Cardiovascular;  Laterality: N/A;   lumbar back surgery  04/04/2018   done by neurosurgeron   right foot bone spurs removed     ROTATOR CUFF REPAIR Right    SHOULDER SURGERY Left    x2- rotatar cuff tear   UPPER GASTROINTESTINAL ENDOSCOPY      SOCIAL HISTORY:   Social History   Socioeconomic History   Marital status: Married    Spouse name: Programme researcher, broadcasting/film/video   Number of children: 3   Years of education: 9   Highest education level: 9th grade  Occupational History   Occupation: disabled    Fish farm manager: UNEMPLOYED  Social Designer, fashion/clothing strain: Not on file   Food insecurity    Worry: Never true    Inability: Never true   Transportation  needs    Medical: No    Non-medical: No  Tobacco Use   Smoking status: Former Smoker    Packs/day: 1.50    Years: 5.00    Pack years: 7.50    Types: Cigarettes    Quit date: 05/31/1994    Years since quitting: 24.8   Smokeless tobacco: Never Used  Substance and Sexual Activity   Alcohol use: No    Alcohol/week: 0.0 standard drinks   Drug use: No   Sexual activity: Not on file  Lifestyle   Physical activity    Days per week: Not on file    Minutes per session: Not on file   Stress: Not at all  Relationships   Social connections    Talks on phone: Not on file    Gets together: Not on file    Attends religious service: Not on file    Active member of club or organization: Not on file    Attends meetings of clubs or organizations: Not on file  Relationship status: Not on file   Intimate partner violence    Fear of current or ex partner: Not on file    Emotionally abused: Not on file    Physically abused: Not on file    Forced sexual activity: Not on file  Other Topics Concern   Not on file  Social History Narrative   Pt is on [employer] program [place]   Lives with husband and granddgt.   Lots of family stressors including special-needs grandaughter who lives with them.      Advance Directives: None - Patient's plan is that her husband will make decisions for her if she were not capable of making informed decisions for herself. (Discussion with Sherren Mocha McDiarmid, MD 12/11/18)   Full Code: Desires CPR/ACLS  (Discussion with Sherren Mocha McDiarmid, MD 12/11/18)          FAMILY HISTORY:   Family Status  Relation Name Status   Father  Deceased   Mat Aunt  Deceased   Mother  Deceased   Brother x2 Deceased   Sister  (Not Specified)   Daughter terry Alive   Sister  (Not Specified)   Sister  Deceased   Daughter tammy Alive   Daughter shannon Alive   Neg Hx  (Not Specified)    ROS:  Review of Systems  Constitutional: Negative.   HENT: Negative.   Eyes:  Negative.   Cardiovascular: Negative.   Gastrointestinal: Negative.   Skin: Negative.   Endo/Heme/Allergies: Negative.   Psychiatric/Behavioral: The patient is nervous/anxious (Due to death of family members, as well as fighting adult children).      PHYSICAL EXAMINATION:    VITALS:   Vitals:   03/22/19 0803  BP: (!) 160/85  Pulse: 89  SpO2: 96%  Weight: 148 lb (67.1 kg)  Height: 5\' 3"  (1.6 m)    GEN:  The patient appears stated age and is in NAD. HEENT:  Normocephalic, atraumatic.  The mucous membranes are moist. The superficial temporal arteries are without ropiness or tenderness. CV:  RRR Lungs:  CTAB Neck/HEME:  There are no carotid bruits bilaterally.  Neurological examination:  Orientation: The patient is alert and oriented x3. Cranial nerves: There is good facial symmetry. The speech is fluent and clear. Soft palate rises symmetrically and there is no tongue deviation. Hearing is intact to conversational tone. Sensation: Sensation is intact to light touch throughout Motor: Strength is 5/5 in the bilateral upper and lower extremities.   Shoulder shrug is equal and symmetric.  There is no pronator drift.  Movement examination: Tone: There is normal tone in the upper and lower extremities. Abnormal movements: No rest tremor.  Mild postural tremor on the left.  No trouble with Archimedes spirals Coordination:  There is no decremation with RAM's, with any form of RAMS, including alternating supination and pronation of the forearm, hand opening and closing, finger taps, heel taps and toe taps. Gait and Station: The patient has no difficulty arising out of a deep-seated chair without the use of the hands. The patient's stride length is normal.      LABS  Had lab work at [facility] dated January 22, 2018.  White blood cells were 8.4, hemoglobin 12.7, hematocrit 39.9 and platelets 209.  AST was 28, ALT 22, BUN 13 and creatinine 0.52.  The remainder of the chemistry was not done on  that date.  Sedimentation rate was 6.  Lab Results  Component Value Date   TSH 2.17 09/16/2018   Lab Results  Component  Value Date   VITAMINB12 394 09/16/2018        Chemistry      Component Value Date/Time   NA 138 02/16/2019 0941   K 4.5 02/16/2019 0941   CL 99 02/16/2019 0941   CO2 23 02/16/2019 0941   BUN 7 (L) 02/16/2019 0941   CREATININE 0.52 (L) 02/16/2019 0941   CREATININE 0.46 (L) 07/22/2016 1026      Component Value Date/Time   CALCIUM 9.5 02/16/2019 0941   ALKPHOS 107 02/16/2019 0941   AST 35 02/16/2019 0941   ALT 26 02/16/2019 0941   BILITOT 0.4 02/16/2019 0941     Lab Results  Component Value Date   TSH 2.17 09/16/2018      ASSESSMENT/PLAN:  1.   Essential Tremor.  -I told the patient that this likely represents asymmetric essential tremor, worsened by the medications for her lungs.  The left hand is worse than the right.  She is back on primidone, 50 mg, 2 tablets in the morning and 1 at night.  She is doing well with this.  2.  B12 deficiency  -When she stopped the supplement, the B12 level went down again.  She is back on it.  She should continue on this.  3. Iron deficiency anemia  -Status post iron infusion  4.  Hip and low back pain  -Doing well post surgery in October, 2020.  She has other generalized pains from her rheumatoid and osteoarthritis.  5. Memory loss  -I see no evidence of dementia.  I think that anxiety/depression as well as medications play a role.  6.  Diarrhea  -Now resolved.  -Seems related to blood sugar issues, but I do think that we should consider that perhaps it was related to her donepezil.  I did not give this to her, and do not think she needs it, but she is also off of this.  7. F/u 9 months     Cc:  [name], MD

## 2019-03-22 ENCOUNTER — Encounter: Payer: Self-pay | Admitting: Neurology

## 2019-03-22 ENCOUNTER — Other Ambulatory Visit: Payer: Self-pay

## 2019-03-22 ENCOUNTER — Ambulatory Visit (INDEPENDENT_AMBULATORY_CARE_PROVIDER_SITE_OTHER): Payer: Medicare Other | Admitting: Neurology

## 2019-03-22 VITALS — BP 160/85 | HR 89 | Ht 63.0 in | Wt 148.0 lb

## 2019-03-22 DIAGNOSIS — G250 Essential tremor: Secondary | ICD-10-CM

## 2019-03-22 DIAGNOSIS — R413 Other amnesia: Secondary | ICD-10-CM | POA: Diagnosis not present

## 2019-03-22 DIAGNOSIS — R197 Diarrhea, unspecified: Secondary | ICD-10-CM

## 2019-03-22 DIAGNOSIS — E538 Deficiency of other specified B group vitamins: Secondary | ICD-10-CM

## 2019-03-22 MED ORDER — PRIMIDONE 50 MG PO TABS: ORAL_TABLET | ORAL | 2 refills | Status: DC

## 2019-03-22 NOTE — Patient Instructions (Addendum)
Continue current medication  The physicians and staff at [facility] are committed to providing excellent care. You may receive a survey requesting feedback about your experience at our office. We strive to receive "very good" responses to the survey questions. If you feel that your experience would prevent you from giving the office a "very good " response, please contact our office to try to remedy the situation. We may be reached at [phone]. Thank you for taking the time out of your busy day to complete the survey.

## 2019-03-26 DIAGNOSIS — J45991 Cough variant asthma: Secondary | ICD-10-CM | POA: Diagnosis not present

## 2019-03-26 DIAGNOSIS — M069 Rheumatoid arthritis, unspecified: Secondary | ICD-10-CM | POA: Diagnosis not present

## 2019-03-26 DIAGNOSIS — Z87891 Personal history of nicotine dependence: Secondary | ICD-10-CM | POA: Diagnosis not present

## 2019-03-26 DIAGNOSIS — R918 Other nonspecific abnormal finding of lung field: Secondary | ICD-10-CM | POA: Diagnosis not present

## 2019-04-01 DIAGNOSIS — M059 Rheumatoid arthritis with rheumatoid factor, unspecified: Secondary | ICD-10-CM | POA: Diagnosis not present

## 2019-04-01 NOTE — Telephone Encounter (Signed)
Upon review of chart.  Med was changed.  Christen Bame, CMA

## 2019-04-12 ENCOUNTER — Other Ambulatory Visit: Payer: Self-pay | Admitting: Family Medicine

## 2019-04-12 DIAGNOSIS — E119 Type 2 diabetes mellitus without complications: Secondary | ICD-10-CM

## 2019-04-18 ENCOUNTER — Other Ambulatory Visit: Payer: Self-pay | Admitting: Student in an Organized Health Care Education/Training Program

## 2019-05-19 DIAGNOSIS — M4316 Spondylolisthesis, lumbar region: Secondary | ICD-10-CM | POA: Diagnosis not present

## 2019-05-25 DIAGNOSIS — M059 Rheumatoid arthritis with rheumatoid factor, unspecified: Secondary | ICD-10-CM | POA: Diagnosis not present

## 2019-05-31 ENCOUNTER — Other Ambulatory Visit: Payer: Self-pay | Admitting: Family Medicine

## 2019-06-01 NOTE — Telephone Encounter (Signed)
Can we schedule her an appointment with me for blood pressure.  She reduced her dose recently and most recent BP was 0000000 systolic.  I'll refill this prescription as is for now but would like to see her soon.

## 2019-06-02 ENCOUNTER — Encounter: Payer: Self-pay | Admitting: Family Medicine

## 2019-06-02 ENCOUNTER — Ambulatory Visit (INDEPENDENT_AMBULATORY_CARE_PROVIDER_SITE_OTHER): Payer: Medicare Other | Admitting: Family Medicine

## 2019-06-02 ENCOUNTER — Other Ambulatory Visit: Payer: Self-pay

## 2019-06-02 VITALS — BP 116/62 | HR 67 | Ht 63.0 in | Wt 146.0 lb

## 2019-06-02 DIAGNOSIS — G8929 Other chronic pain: Secondary | ICD-10-CM

## 2019-06-02 DIAGNOSIS — G894 Chronic pain syndrome: Secondary | ICD-10-CM | POA: Diagnosis not present

## 2019-06-02 DIAGNOSIS — E119 Type 2 diabetes mellitus without complications: Secondary | ICD-10-CM | POA: Diagnosis not present

## 2019-06-02 DIAGNOSIS — I10 Essential (primary) hypertension: Secondary | ICD-10-CM | POA: Diagnosis not present

## 2019-06-02 LAB — POCT GLYCOSYLATED HEMOGLOBIN (HGB A1C): HbA1c, POC (controlled diabetic range): 7.1 % — AB (ref 0.0–7.0)

## 2019-06-02 MED ORDER — OXYCODONE-ACETAMINOPHEN 10-325 MG PO TABS: 1.0000 | ORAL_TABLET | Freq: Four times a day (QID) | ORAL | 0 refills | Status: DC | PRN

## 2019-06-02 NOTE — Assessment & Plan Note (Signed)
Blood pressure 116/62 today.  Patient compliant with her half tablet of losartan 25. -Continue losartan 12.5 mg daily

## 2019-06-02 NOTE — Assessment & Plan Note (Signed)
A1c improved to 7.1.  Patient states she has been trying to improve her diet.  Also states that her appetite has waned recently.  Patient's weight is the same as it has been from the last several previous visits. -No changes to medication. -Continue to limit intake of carbohydrates

## 2019-06-02 NOTE — Patient Instructions (Signed)
It was nice to see again today,  You are doing a great job watching your sugar intake and can: Your diabetes with your diet.  Keep up the good work.  We do not need to make any changes to your medications  I will continue using your current blood pressure medicine as it is.  Your blood pressure was great today.  For the pain in your ribs, it is likely you just bruised them.  Please remember it is important to take normal breaths even if it hurts to breathe, because she did not want to risk getting a respiratory infection.  I will refill your pain medication.  I will see you again in 3 months.  Have a great day,  Clemetine Marker, MD

## 2019-06-02 NOTE — Progress Notes (Signed)
   [facility] Phone: [phone]     Michelle [name] - 68 y.o. female MRN [id number]  Date of birth: August 14, 1950  Subjective:   cc: Fall, diabetes, blood pressure  HPI:  [name]: on Friday the 13th.  She slipped on her back deck landed on her side back doctor' checked.  Slipped on back deck. Landed on her left side on the bottom 2 steps.  About 1 week later she got an x-ray that did not show any rib fractures.  She states she has to lay on the side that got injured because if she lays on the other side at night she has trouble breathing.  She has been feeling better the last couple days, and was carrying wood for her wood stove from outside into the house without trouble..  Arthritis: Patient has not taken any extra medications due to the pain from the fall.  States she still has pain at baseline, but it is tolerable on the current medications.  Does not desire to make any adjustments to her medications.  DM: Patient states she is doing well on reducing her sugar intake.  She still eats biscuits but they are the small biscuits and they are not as frequent.  Still has some sweet tea at supper time.  Admits that she a little more than usual at Thanksgiving, but that is expected.  BP: doing well.  Does not remember falling and refill.  Still cutting her losartan in half.  Denies transient side effects.  ROS: See HPI for pertinent positives and negatives  Family history reviewed for today's visit. No changes.  Social history- patient is a non-smoker smoker   Objective:   BP 116/62   Pulse 67   Ht 5\' 3"  (1.6 m)   Wt 146 lb (66.2 kg)   SpO2 97%   BMI 25.86 kg/m  Gen: Alert and oriented, no acute distress. CV: Regular rate and rhythm, no murmurs. Resp: inspiratory crackles heard bilaterally. Msk: Pinpoint tenderness to palpation on the left flank near the T12-L1 level.  No masses palpated.  No bruises appreciated  Assessment/Plan:   Diabetes mellitus without  complication (HCC) 123456 improved to 7.1.  Patient states she has been trying to improve her diet.  Also states that her appetite has waned recently.  Patient's weight is the same as it has been from the last several previous visits. -No changes to medication. -Continue to limit intake of carbohydrates  Essential hypertension Blood pressure 116/62 today.  Patient compliant with her half tablet of losartan 25. -Continue losartan 12.5 mg daily  Encounter for chronic pain management For osteo and rheumatoid arthritis.  Tolerating well.  Still has some pain but is manageable. -Continue Percocet.  Refill for 3 more months.     Clemetine Marker, MD PGY-2 [facility]

## 2019-06-02 NOTE — Assessment & Plan Note (Addendum)
For osteo and rheumatoid arthritis.  Tolerating well.  Still has some pain but is manageable.  Left side ribs still hurt from fall, but has not caused patient to use more of her medication. -Continue Percocet.  Refill for 3 more months.

## 2019-06-09 ENCOUNTER — Other Ambulatory Visit: Payer: Self-pay | Admitting: Student in an Organized Health Care Education/Training Program

## 2019-06-09 DIAGNOSIS — J45909 Unspecified asthma, uncomplicated: Secondary | ICD-10-CM

## 2019-07-12 ENCOUNTER — Other Ambulatory Visit: Payer: Self-pay | Admitting: Family Medicine

## 2019-07-12 DIAGNOSIS — Z1231 Encounter for screening mammogram for malignant neoplasm of breast: Secondary | ICD-10-CM

## 2019-07-13 ENCOUNTER — Ambulatory Visit (INDEPENDENT_AMBULATORY_CARE_PROVIDER_SITE_OTHER): Payer: Medicare Other | Admitting: Family Medicine

## 2019-07-13 ENCOUNTER — Other Ambulatory Visit: Payer: Self-pay

## 2019-07-13 ENCOUNTER — Encounter: Payer: Self-pay | Admitting: Family Medicine

## 2019-07-13 VITALS — BP 112/58 | HR 103 | Wt 141.8 lb

## 2019-07-13 DIAGNOSIS — R21 Rash and other nonspecific skin eruption: Secondary | ICD-10-CM

## 2019-07-13 DIAGNOSIS — R5383 Other fatigue: Secondary | ICD-10-CM

## 2019-07-13 DIAGNOSIS — R519 Headache, unspecified: Secondary | ICD-10-CM

## 2019-07-13 DIAGNOSIS — R630 Anorexia: Secondary | ICD-10-CM | POA: Diagnosis not present

## 2019-07-13 DIAGNOSIS — R040 Epistaxis: Secondary | ICD-10-CM

## 2019-07-13 MED ORDER — TETANUS-DIPHTH-ACELL PERTUSSIS 5-2.5-18.5 LF-MCG/0.5 IM SUSP: 0.5000 mL | Freq: Once | INTRAMUSCULAR | 0 refills | Status: AC

## 2019-07-13 MED ORDER — TETANUS-DIPHTH-ACELL PERTUSSIS 5-2.5-18.5 LF-MCG/0.5 IM SUSP: 0.5000 mL | Freq: Once | INTRAMUSCULAR | 0 refills | Status: DC

## 2019-07-13 MED ORDER — ZOSTER VAC RECOMB ADJUVANTED 50 MCG/0.5ML IM SUSR: 0.5000 mL | Freq: Once | INTRAMUSCULAR | 0 refills | Status: AC

## 2019-07-13 MED ORDER — ZOSTER VAC RECOMB ADJUVANTED 50 MCG/0.5ML IM SUSR: 0.5000 mL | Freq: Once | INTRAMUSCULAR | 0 refills | Status: DC

## 2019-07-13 MED ORDER — HYDROCORTISONE 1 % EX LOTN: 1.0000 "application " | TOPICAL_LOTION | Freq: Two times a day (BID) | CUTANEOUS | 0 refills | Status: DC

## 2019-07-13 NOTE — Progress Notes (Signed)
[facility] Phone: [phone]     Michelle [name] - 69 y.o. female MRN [id number]  Date of birth: 11-07-1950  Subjective:   cc: Facial rash, nosebleeds, poor appetite  HPI:  Rash/itching: Patient has had itchy eyes for approximately 3 months.  She went to an ophthalmologist and was given artificial tears, Pataday.  She still uses it could but states it does not help.  2 weeks ago patient started noticing a rash on her face below her eyes as well as her arms.  She has not used any new detergents or soaps or tried any new medications.  She takes cetirizine nightly and hydroxyzine every once in a while.  She sees a dermatologist occasionally for follow-up of skin cancer and sebaceous cyst removal.   Nose bleeds:  Five nose bleeds in the past two weeks.    Always on the left side.  They last less than ten minutes.  She feels 'very tired' and finds her self dozing 'off and on' through out the day.  Gets dizzy when she stands up and has to wait a few seconds before walking after standing..  Eats ice all the time.   Headaches: Daily headaches for past two weeks.  Takes 200 mg of advil and lays down.  Location is bitemporal.  No nausea vomiting, no changes in vision, no photophobia  Loss of appetite: For the past few weeks has had 'no desire to eat'.  Eats a pack of cracker for lunch, skips breakfast.  Has a normal dinner.  .     ROS: See HPI for pertinent positives and negatives  Family history reviewed for today's visit. No changes.  Social history- patient is a former smoker. qit 25 years ago.     Objective:   BP (!) 112/58   Pulse (!) 103   Wt 141 lb 12.8 oz (64.3 kg)   SpO2 95%   BMI 25.12 kg/m  Gen: Alert and oriented.  No acute distress.  Appears stated age. HEENT: PERRLA.  Slight conjunctival pallor.  Moist oral mucosa.  Septum appears to be deviated to the left.  In the left nares septum is more prominent and erythematous.  no evidence of recent  bleed Neck: No thyromegaly. CV: Regular rate rhythm.  No murmurs. Resp: Crackles heard consistent with previous exams.  No wheezing.  No respiratory distress.  No cough. GI: Soft, nontender.  Normal bowel sounds. Msk: No edema. Neuro: Cranial nerves II through XII grossly intact. Skin: Diffuse pink rash below the eyes and cheeks. Psych: Pleasant affect.      Assessment/Plan:   Facial rash Complains of pruritis of eyes for 3 months and 2 weeks of pruritic facial rash.  On exam there appears to be a mild rash below the cheeks.  Sclera appear normal and noninjected.  Is currently trying multiple treatments including eyedrops and antihistamines with no effect.  No history of atopy and rash does not appear like classic eczema but based on appearance and history it is difficult to say what else this could be.  If rash becomes more prominent and more distinctively malar could consider lupus or other autoimmune disorder.   -Hydrocortisone 1% cream twice daily for 2 weeks. -Continue other medications -If this does not improve symptoms after 2 weeks, patient should contact her dermatologist for an appointment.  Epistaxis 5 occurrences in the past 2 weeks.  Always on the left side.  Physical exam showed enlargement and erythema of the left septum.  Patient also complaining of fatigue during this time and some orthostasis.  Most likely due to dry air causing rupture of blood vessels near the surface. -Will get CBC and TSH given patient's complaint of fatigue -Advised patient to humidify the air with a humidifier or using steam from tea kettle on her wood-burning stove.  Headache Occurring for 2 weeks as with the rest of her current symptoms.  Bitemporal.  No vision changes, no vomiting.  No focal neural deficits.  Responds to low-dose ibuprofen.  Likely a tension headache.  May be associated with anemia given the patient's other symptoms consistent with anemia and her recent episodes of  epistaxis. -Switch from ibuprofen to Tylenol given her complaints of bleeding and possible anemia -Continue to monitor, follow-up in 1 month.  Poor appetite Also occurring for the past 2 weeks.  No changes in taste or smell.  No dysphagia.  patient has lost approximately 4 pounds since her last visit.  Given the sudden onset of this and her multiple other complaints today it is difficult to narrow down the cause, but dysphagia or esophageal abnormalities less likely.  We will need to consider the possibility of malignancy.  We will have her follow-up in a month for further evaluation. -Gave patient samples of nutritional supplements. -Advised patient to drink 3 nutritional shakes a day to get to over 1000 cal in addition to her normal eating.     Clemetine Marker, MD PGY-2 [facility]

## 2019-07-13 NOTE — Patient Instructions (Addendum)
It was nice to see you again today,  I think your nosebleeds are caused by dry air.  1 thing you can do is to get a humidifier or or set a tea kettle on top of your wood-burning stove to humidify the air.  I believe the combination of your nosebleeds and your poor appetite have made you a little anemic, and this is most likely the cause of your headaches and the dizziness you feel when you stand up along with the fatigue.  I will get a CBC to check your blood as well as a TSH.  If any of these come back abnormal, I will call you to let you know what to do next.  For your facial rash, the most likely cause is eczema.  I will prescribe you a steroid cream they can put on your face twice a day for the next 2 weeks.  If it does not get better I think you should call your dermatologist and talk with them about treating it.  If it is caused by eczema, your itchy eyes should resolve as well.  I have given you a written prescription for the Shingrix and Tdap vaccine that you take to your pharmacy.  I have given you a couple samples of nutritional supplements.  I would like you to drink 2-3 of these a day in addition to whatever else you are eating.  They have roughly 350 cal in them so drinking 3 should put you over 1000 cal, which should help you from losing weight.  For your headaches, please take Tylenol if you can instead of Advil.  Taking things such as Advil, aspirin, naproxen, Motrin can cause you to bleed more, which is not good considering you have nosebleeds currently.  If you have to take the Advil or naproxen please take them sparingly.  I would like to see back in 1 month so we can go over all this again and see if it is improved or worsened.  Have a great day,  Clemetine Marker, MD

## 2019-07-14 LAB — CBC WITH DIFFERENTIAL
Basophils Absolute: 0.1 10*3/uL (ref 0.0–0.2)
Basos: 1 %
EOS (ABSOLUTE): 0.3 10*3/uL (ref 0.0–0.4)
Eos: 3 %
Hematocrit: 35.4 % (ref 34.0–46.6)
Hemoglobin: 12 g/dL (ref 11.1–15.9)
Immature Grans (Abs): 0 10*3/uL (ref 0.0–0.1)
Immature Granulocytes: 0 %
Lymphocytes Absolute: 2.7 10*3/uL (ref 0.7–3.1)
Lymphs: 28 %
MCH: 29.9 pg (ref 26.6–33.0)
MCHC: 33.9 g/dL (ref 31.5–35.7)
MCV: 88 fL (ref 79–97)
Monocytes Absolute: 0.8 10*3/uL (ref 0.1–0.9)
Monocytes: 9 %
Neutrophils Absolute: 5.7 10*3/uL (ref 1.4–7.0)
Neutrophils: 59 %
RBC: 4.01 x10E6/uL (ref 3.77–5.28)
RDW: 12.7 % (ref 11.7–15.4)
WBC: 9.6 10*3/uL (ref 3.4–10.8)

## 2019-07-14 LAB — TSH: TSH: 1.32 u[IU]/mL (ref 0.450–4.500)

## 2019-07-17 DIAGNOSIS — R519 Headache, unspecified: Secondary | ICD-10-CM | POA: Insufficient documentation

## 2019-07-17 DIAGNOSIS — R040 Epistaxis: Secondary | ICD-10-CM | POA: Insufficient documentation

## 2019-07-17 DIAGNOSIS — R21 Rash and other nonspecific skin eruption: Secondary | ICD-10-CM | POA: Insufficient documentation

## 2019-07-17 DIAGNOSIS — R630 Anorexia: Secondary | ICD-10-CM | POA: Insufficient documentation

## 2019-07-17 NOTE — Assessment & Plan Note (Signed)
5 occurrences in the past 2 weeks.  Always on the left side.  Physical exam showed enlargement and erythema of the left septum.  Patient also complaining of fatigue during this time and some orthostasis.  Most likely due to dry air causing rupture of blood vessels near the surface. -Will get CBC and TSH given patient's complaint of fatigue -Advised patient to humidify the air with a humidifier or using steam from tea kettle on her wood-burning stove.

## 2019-07-17 NOTE — Assessment & Plan Note (Addendum)
Occurring for 2 weeks as with the rest of her current symptoms.  Bitemporal.  No vision changes, no vomiting.  No focal neural deficits.  Responds to low-dose ibuprofen.  Likely a tension headache.  May be associated with anemia given the patient's other symptoms consistent with anemia and her recent episodes of epistaxis. -Switch from ibuprofen to Tylenol given her complaints of bleeding and possible anemia -Continue to monitor, follow-up in 1 month.

## 2019-07-17 NOTE — Assessment & Plan Note (Signed)
Complains of pruritis of eyes for 3 months and 2 weeks of pruritic facial rash.  On exam there appears to be a mild rash below the cheeks.  Sclera appear normal and noninjected.  Is currently trying multiple treatments including eyedrops and antihistamines with no effect.  No history of atopy and rash does not appear like classic eczema but based on appearance and history it is difficult to say what else this could be.  If rash becomes more prominent and more distinctively malar could consider lupus or other autoimmune disorder.   -Hydrocortisone 1% cream twice daily for 2 weeks. -Continue other medications -If this does not improve symptoms after 2 weeks, patient should contact her dermatologist for an appointment.

## 2019-07-17 NOTE — Assessment & Plan Note (Signed)
Also occurring for the past 2 weeks.  No changes in taste or smell.  No dysphagia.  patient has lost approximately 4 pounds since her last visit.  Given the sudden onset of this and her multiple other complaints today it is difficult to narrow down the cause, but dysphagia or esophageal abnormalities less likely.  We will need to consider the possibility of malignancy.  We will have her follow-up in a month for further evaluation. -Gave patient samples of nutritional supplements. -Advised patient to drink 3 nutritional shakes a day to get to over 1000 cal in addition to her normal eating.

## 2019-07-19 ENCOUNTER — Telehealth: Payer: Self-pay

## 2019-07-19 ENCOUNTER — Ambulatory Visit (INDEPENDENT_AMBULATORY_CARE_PROVIDER_SITE_OTHER): Payer: Medicare Other | Admitting: Podiatry

## 2019-07-19 ENCOUNTER — Encounter: Payer: Self-pay | Admitting: Podiatry

## 2019-07-19 ENCOUNTER — Other Ambulatory Visit: Payer: Self-pay

## 2019-07-19 VITALS — Temp 97.2°F

## 2019-07-19 DIAGNOSIS — B351 Tinea unguium: Secondary | ICD-10-CM

## 2019-07-19 DIAGNOSIS — L84 Corns and callosities: Secondary | ICD-10-CM

## 2019-07-19 DIAGNOSIS — M79674 Pain in right toe(s): Secondary | ICD-10-CM | POA: Diagnosis not present

## 2019-07-19 DIAGNOSIS — M79675 Pain in left toe(s): Secondary | ICD-10-CM

## 2019-07-19 DIAGNOSIS — E119 Type 2 diabetes mellitus without complications: Secondary | ICD-10-CM

## 2019-07-19 NOTE — Telephone Encounter (Signed)
Pt calling to get lab results. Ottis Stain, CMA

## 2019-07-20 DIAGNOSIS — M059 Rheumatoid arthritis with rheumatoid factor, unspecified: Secondary | ICD-10-CM | POA: Diagnosis not present

## 2019-07-23 NOTE — Telephone Encounter (Signed)
LVM telling pt her results were normal and we can re-assess her issues at her followup in a few weeks.

## 2019-07-24 NOTE — Progress Notes (Signed)
Subjective: Michelle [name] presents today for follow up of preventative diabetic foot care: and painful mycotic nails b/l.  Pain interferes with ambulation. Aggravating factors include wearing enclosed shoe gear. Pt also presents with painful corn(s) and callus(es) which interfere with ambulation.   Allergies  Allergen Reactions  . Zofran [Ondansetron Hcl] Other (See Comments)    Prolong QT- pt unsure of this allergy  . Ace Inhibitors Cough    Occurred with liinopril  . Levofloxacin Other (See Comments)    Felt things on her legs that were not there,made her stomach hurt  . Lisinopril Cough  . Sulfonamide Derivatives Hives and Itching     Objective: Vitals:   07/19/19 0910  Temp: (!) 97.2 F (36.2 C)    Vascular Examination:  capillary refill time to digits immediate b/l, palpable DP pulses b/l, palpable PT pulses b/l, pedal hair sparse b/l and skin temperature gradient within normal limits b/l  Dermatological Examination: Pedal skin with normal turgor, texture and tone bilaterally. No open wounds bilaterally. No interdigital macerations bilaterally.   Hyperkeratotic lesion distal tip right 3rd digit with tenderness to palpation. No edema, no erythema, no drainage, no flocculence.  Onychomycosis of the toenails with the following characteristics: elongation, dystrophic, discolored, thickened, subungual debris, pain to palpation of nail beds 1-5 b/l.  Musculoskeletal: normal muscle strength 5/5 to all lower extremity muscle groups bilaterally, no gross bony deformities bilaterally and no pain crepitus or joint limitation noted with ROM  Neurological: sensation intact 5/5 intact bilaterally with 10g monofilament, vibratory sensation intact and proprioception intact bilaterally  Assessment: 1. Pain due to onychomycosis of toenails of both feet   2. Corns   3. Diabetes mellitus without complication (HCC)      Plan: -Toenails 1-5 b/l were debrided in length and girth without  iatrogenic bleeding. -The above corns and calluses were debrided without complication or incident. Total number debrided one to distal aspect right 3rd digit -Patient to continue soft, supportive shoe gear daily. -Patient to report any pedal injuries to medical professional immediately. -Patient/POA to call should there be question/concern in the interim.   Return in about 3 months (around 10/17/2019).

## 2019-08-02 ENCOUNTER — Other Ambulatory Visit: Payer: Self-pay | Admitting: Student in an Organized Health Care Education/Training Program

## 2019-08-06 DIAGNOSIS — F341 Dysthymic disorder: Secondary | ICD-10-CM | POA: Diagnosis not present

## 2019-08-06 DIAGNOSIS — Z882 Allergy status to sulfonamides status: Secondary | ICD-10-CM | POA: Diagnosis not present

## 2019-08-06 DIAGNOSIS — M778 Other enthesopathies, not elsewhere classified: Secondary | ICD-10-CM | POA: Diagnosis not present

## 2019-08-06 DIAGNOSIS — Z981 Arthrodesis status: Secondary | ICD-10-CM | POA: Diagnosis not present

## 2019-08-06 DIAGNOSIS — Z7951 Long term (current) use of inhaled steroids: Secondary | ICD-10-CM | POA: Diagnosis not present

## 2019-08-06 DIAGNOSIS — E119 Type 2 diabetes mellitus without complications: Secondary | ICD-10-CM | POA: Diagnosis not present

## 2019-08-06 DIAGNOSIS — Z79899 Other long term (current) drug therapy: Secondary | ICD-10-CM | POA: Diagnosis not present

## 2019-08-06 DIAGNOSIS — M069 Rheumatoid arthritis, unspecified: Secondary | ICD-10-CM | POA: Diagnosis not present

## 2019-08-06 DIAGNOSIS — Z7984 Long term (current) use of oral hypoglycemic drugs: Secondary | ICD-10-CM | POA: Diagnosis not present

## 2019-08-06 DIAGNOSIS — K219 Gastro-esophageal reflux disease without esophagitis: Secondary | ICD-10-CM | POA: Diagnosis not present

## 2019-08-06 DIAGNOSIS — M059 Rheumatoid arthritis with rheumatoid factor, unspecified: Secondary | ICD-10-CM | POA: Diagnosis not present

## 2019-08-06 DIAGNOSIS — E785 Hyperlipidemia, unspecified: Secondary | ICD-10-CM | POA: Diagnosis not present

## 2019-08-06 DIAGNOSIS — M7989 Other specified soft tissue disorders: Secondary | ICD-10-CM | POA: Diagnosis not present

## 2019-08-06 DIAGNOSIS — M25519 Pain in unspecified shoulder: Secondary | ICD-10-CM | POA: Diagnosis not present

## 2019-08-06 DIAGNOSIS — Z5181 Encounter for therapeutic drug level monitoring: Secondary | ICD-10-CM | POA: Diagnosis not present

## 2019-08-12 DIAGNOSIS — Z85828 Personal history of other malignant neoplasm of skin: Secondary | ICD-10-CM | POA: Diagnosis not present

## 2019-08-12 DIAGNOSIS — C44729 Squamous cell carcinoma of skin of left lower limb, including hip: Secondary | ICD-10-CM | POA: Diagnosis not present

## 2019-08-12 DIAGNOSIS — L578 Other skin changes due to chronic exposure to nonionizing radiation: Secondary | ICD-10-CM | POA: Diagnosis not present

## 2019-08-13 ENCOUNTER — Other Ambulatory Visit: Payer: Self-pay

## 2019-08-13 ENCOUNTER — Ambulatory Visit
Admission: RE | Admit: 2019-08-13 | Discharge: 2019-08-13 | Disposition: A | Discharge status: Home / Self Care | Payer: Medicare Other | Source: Ambulatory Visit | Attending: Family Medicine | Admitting: Family Medicine

## 2019-08-13 DIAGNOSIS — Z1231 Encounter for screening mammogram for malignant neoplasm of breast: Secondary | ICD-10-CM | POA: Diagnosis not present

## 2019-08-31 ENCOUNTER — Ambulatory Visit (INDEPENDENT_AMBULATORY_CARE_PROVIDER_SITE_OTHER): Payer: Medicare Other | Admitting: Family Medicine

## 2019-08-31 ENCOUNTER — Encounter: Payer: Self-pay | Admitting: Family Medicine

## 2019-08-31 ENCOUNTER — Other Ambulatory Visit: Payer: Self-pay

## 2019-08-31 VITALS — BP 124/58 | HR 86 | Ht 63.0 in | Wt 143.2 lb

## 2019-08-31 DIAGNOSIS — M069 Rheumatoid arthritis, unspecified: Secondary | ICD-10-CM | POA: Diagnosis not present

## 2019-08-31 DIAGNOSIS — H1045 Other chronic allergic conjunctivitis: Secondary | ICD-10-CM | POA: Diagnosis not present

## 2019-08-31 DIAGNOSIS — H01134 Eczematous dermatitis of left upper eyelid: Secondary | ICD-10-CM | POA: Diagnosis not present

## 2019-08-31 DIAGNOSIS — R040 Epistaxis: Secondary | ICD-10-CM

## 2019-08-31 DIAGNOSIS — R21 Rash and other nonspecific skin eruption: Secondary | ICD-10-CM

## 2019-08-31 DIAGNOSIS — H02831 Dermatochalasis of right upper eyelid: Secondary | ICD-10-CM | POA: Diagnosis not present

## 2019-08-31 DIAGNOSIS — G894 Chronic pain syndrome: Secondary | ICD-10-CM | POA: Diagnosis not present

## 2019-08-31 DIAGNOSIS — T300 Burn of unspecified body region, unspecified degree: Secondary | ICD-10-CM | POA: Diagnosis not present

## 2019-08-31 DIAGNOSIS — H01131 Eczematous dermatitis of right upper eyelid: Secondary | ICD-10-CM | POA: Diagnosis not present

## 2019-08-31 DIAGNOSIS — H2511 Age-related nuclear cataract, right eye: Secondary | ICD-10-CM | POA: Diagnosis not present

## 2019-08-31 DIAGNOSIS — E119 Type 2 diabetes mellitus without complications: Secondary | ICD-10-CM

## 2019-08-31 DIAGNOSIS — R630 Anorexia: Secondary | ICD-10-CM | POA: Diagnosis not present

## 2019-08-31 DIAGNOSIS — H01135 Eczematous dermatitis of left lower eyelid: Secondary | ICD-10-CM | POA: Diagnosis not present

## 2019-08-31 DIAGNOSIS — H40013 Open angle with borderline findings, low risk, bilateral: Secondary | ICD-10-CM | POA: Diagnosis not present

## 2019-08-31 DIAGNOSIS — H02834 Dermatochalasis of left upper eyelid: Secondary | ICD-10-CM | POA: Diagnosis not present

## 2019-08-31 DIAGNOSIS — H0102B Squamous blepharitis left eye, upper and lower eyelids: Secondary | ICD-10-CM | POA: Diagnosis not present

## 2019-08-31 DIAGNOSIS — H0102A Squamous blepharitis right eye, upper and lower eyelids: Secondary | ICD-10-CM | POA: Diagnosis not present

## 2019-08-31 DIAGNOSIS — H01132 Eczematous dermatitis of right lower eyelid: Secondary | ICD-10-CM | POA: Diagnosis not present

## 2019-08-31 LAB — POCT GLYCOSYLATED HEMOGLOBIN (HGB A1C): HbA1c, POC (controlled diabetic range): 7.2 % — AB (ref 0.0–7.0)

## 2019-08-31 MED ORDER — OXYCODONE-ACETAMINOPHEN 10-325 MG PO TABS: 1.0000 | ORAL_TABLET | Freq: Four times a day (QID) | ORAL | 0 refills | Status: DC | PRN

## 2019-08-31 MED ORDER — LOSARTAN POTASSIUM 25 MG PO TABS: ORAL_TABLET | ORAL | 0 refills | Status: DC
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
## 2019-08-31 NOTE — Patient Instructions (Addendum)
I have refilled your prescription for your pain medication for the next three months.    Your A1c was 7.2 which is great! Keep up the good work.    I have refilled your losartan.  Your blood pressure is well controlled on this dose.    I will see you back in three months.    Have a great day,   Clemetine Marker, MD

## 2019-09-01 DIAGNOSIS — T300 Burn of unspecified body region, unspecified degree: Secondary | ICD-10-CM | POA: Insufficient documentation

## 2019-09-01 NOTE — Assessment & Plan Note (Signed)
Patient remained satisfied with this dose and frequency.  Denies that it resolves the pain completely, but states it makes it tolerable. -Refill three 1 month prescriptions -Follow-up in 3 months
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
## 2019-09-01 NOTE — Assessment & Plan Note (Signed)
Patient states her appetite is improved.  Weight remains consistently between 1 4150 pounds.  No further work-up.

## 2019-09-01 NOTE — Assessment & Plan Note (Signed)
Patient states dermatologist told her was eczema.  Is continuing to use Pataday with an additional unknown spray on her eyes, as well as hydrocortisone cream on the areas surrounding the eye.

## 2019-09-01 NOTE — Assessment & Plan Note (Signed)
No recurrence since last visit.  Patient unconcerned with them currently.

## 2019-09-06 ENCOUNTER — Other Ambulatory Visit: Payer: Self-pay | Admitting: Family Medicine

## 2019-09-06 DIAGNOSIS — J45909 Unspecified asthma, uncomplicated: Secondary | ICD-10-CM

## 2019-09-17 DIAGNOSIS — H25811 Combined forms of age-related cataract, right eye: Secondary | ICD-10-CM | POA: Diagnosis not present

## 2019-09-22 DIAGNOSIS — M059 Rheumatoid arthritis with rheumatoid factor, unspecified: Secondary | ICD-10-CM | POA: Diagnosis not present

## 2019-09-23 DIAGNOSIS — E785 Hyperlipidemia, unspecified: Secondary | ICD-10-CM | POA: Diagnosis not present

## 2019-09-23 DIAGNOSIS — Z79899 Other long term (current) drug therapy: Secondary | ICD-10-CM | POA: Diagnosis not present

## 2019-09-23 DIAGNOSIS — M654 Radial styloid tenosynovitis [de Quervain]: Secondary | ICD-10-CM | POA: Diagnosis not present

## 2019-09-23 DIAGNOSIS — I10 Essential (primary) hypertension: Secondary | ICD-10-CM | POA: Diagnosis not present

## 2019-09-23 DIAGNOSIS — E119 Type 2 diabetes mellitus without complications: Secondary | ICD-10-CM | POA: Diagnosis not present

## 2019-09-23 DIAGNOSIS — M0579 Rheumatoid arthritis with rheumatoid factor of multiple sites without organ or systems involvement: Secondary | ICD-10-CM | POA: Diagnosis not present

## 2019-09-23 DIAGNOSIS — Z7984 Long term (current) use of oral hypoglycemic drugs: Secondary | ICD-10-CM | POA: Diagnosis not present

## 2019-09-28 DIAGNOSIS — H1045 Other chronic allergic conjunctivitis: Secondary | ICD-10-CM | POA: Diagnosis not present

## 2019-09-28 DIAGNOSIS — Z961 Presence of intraocular lens: Secondary | ICD-10-CM | POA: Diagnosis not present

## 2019-09-28 DIAGNOSIS — H59021 Cataract (lens) fragments in eye following cataract surgery, right eye: Secondary | ICD-10-CM | POA: Diagnosis not present

## 2019-10-01 DIAGNOSIS — H59021 Cataract (lens) fragments in eye following cataract surgery, right eye: Secondary | ICD-10-CM | POA: Diagnosis not present

## 2019-10-05 ENCOUNTER — Ambulatory Visit: Payer: Self-pay

## 2019-10-05 ENCOUNTER — Ambulatory Visit (INDEPENDENT_AMBULATORY_CARE_PROVIDER_SITE_OTHER): Payer: Medicare Other | Admitting: Sports Medicine

## 2019-10-05 ENCOUNTER — Other Ambulatory Visit: Payer: Self-pay

## 2019-10-05 VITALS — BP 128/70 | Ht 63.0 in | Wt 141.0 lb

## 2019-10-05 DIAGNOSIS — M778 Other enthesopathies, not elsewhere classified: Secondary | ICD-10-CM | POA: Diagnosis not present

## 2019-10-05 DIAGNOSIS — M25531 Pain in right wrist: Secondary | ICD-10-CM | POA: Diagnosis not present

## 2019-10-05 MED ORDER — PREDNISONE 10 MG (21) PO TBPK: ORAL_TABLET | ORAL | 0 refills | Status: DC

## 2019-10-06 NOTE — Progress Notes (Addendum)
   Subjective:    Patient ID: Michelle [name], female    DOB: 03/27/1951, 69 y.o.   MRN: [id number]  HPI chief complaint: Right wrist pain and swelling  Patient is a 69 year old right-hand-dominant female that comes in today complaining of 3 months of ulnar-sided right wrist pain and swelling.  She denies any known trauma but believes her symptoms may be the result of having to carry a lot of wood for her wood-burning stove throughout the winter.  Despite her pain she has continued to be active.  She has a history of rheumatoid arthritis and sees a rheumatologist for this.  Recent visit to her rheumatologist revealed that she may be dealing with tendinitis.  She was placed into a wrist brace which was ineffective, and in fact rather painful.  She denies fevers or chills.  No numbness or tingling.  No prior occurrence.  No imaging to date.  Interim medical history reviewed Medications reviewed Allergies reviewed    Review of Systems As above    Objective:   Physical Exam  Well-developed, well-nourished.  No acute distress.  Awake alert and oriented x3.  Vital signs reviewed  Right wrist: Patient has full wrist range of motion but has pain both with active extension as well as ulnar deviation.  There is mild swelling noted along the ulnar aspect of the wrist.  Tender to palpation here as well.  There is a small abrasion which the patient states is secondary to a recent injury a few days ago.  There is no erythema.  Area is not warm to touch.  Good pulses.  MSK ultrasound of the right dorsal wrist was performed.  All 6 dorsal compartments were well visualized.  There is swelling with a positive bull's-eye sign in the 6 dorsal compartment consistent with ECU tendinitis.  Swelling is seen both on the long and short axis views.      Assessment & Plan:   Right wrist pain secondary to ECU tendinitis History of rheumatoid arthritis  I've recommended a 6-day Sterapred Dosepak to take as  directed.  Patient does have a history of type 2 diabetes but has done well on steroid tapers in the past.  If symptoms persist then I may need to consider merits of referral to hand surgery for their input.  Follow-up for ongoing or recalcitrant issues.

## 2019-10-15 ENCOUNTER — Telehealth: Payer: Self-pay

## 2019-10-15 NOTE — Telephone Encounter (Signed)
Received the following refill request. I did not see medication on list.   [facility] [address]  Chlorhex  Glu 0.12% Sol Swish for 30 seconds with 15 ml (one cap full) Of undiluted oral rinse after toothbrushing, then spit. Repeat twice daily.  Ottis Stain, CMA

## 2019-10-18 ENCOUNTER — Other Ambulatory Visit: Payer: Self-pay | Admitting: Family Medicine

## 2019-10-18 MED ORDER — CHLORHEXIDINE GLUCONATE 0.12 % MT SOLN: 15.0000 mL | Freq: Two times a day (BID) | OROMUCOSAL | 3 refills | Status: DC

## 2019-10-18 NOTE — Progress Notes (Unsigned)
chlor

## 2019-10-18 NOTE — Telephone Encounter (Signed)
Looks like it was stopped in June 2020, but no reason found in notes. I don't see an issue with restarting this.  Sent her prescription in to [facility] on [address].

## 2019-10-20 ENCOUNTER — Other Ambulatory Visit: Payer: Self-pay

## 2019-10-20 ENCOUNTER — Ambulatory Visit (INDEPENDENT_AMBULATORY_CARE_PROVIDER_SITE_OTHER): Payer: Medicare Other | Admitting: Podiatry

## 2019-10-20 ENCOUNTER — Encounter: Payer: Self-pay | Admitting: Podiatry

## 2019-10-20 DIAGNOSIS — M79675 Pain in left toe(s): Secondary | ICD-10-CM

## 2019-10-20 DIAGNOSIS — M79674 Pain in right toe(s): Secondary | ICD-10-CM | POA: Diagnosis not present

## 2019-10-20 DIAGNOSIS — B351 Tinea unguium: Secondary | ICD-10-CM

## 2019-10-20 NOTE — Patient Instructions (Signed)
Diabetes Mellitus and Foot Care Foot care is an important part of your health, especially when you have diabetes. Diabetes may cause you to have problems because of poor blood flow (circulation) to your feet and legs, which can cause your skin to:  Become thinner and drier.  Break more easily.  Heal more slowly.  Peel and crack. You may also have nerve damage (neuropathy) in your legs and feet, causing decreased feeling in them. This means that you may not notice minor injuries to your feet that could lead to more serious problems. Noticing and addressing any potential problems early is the best way to prevent future foot problems. How to care for your feet Foot hygiene  Wash your feet daily with warm water and mild soap. Do not use hot water. Then, pat your feet and the areas between your toes until they are completely dry. Do not soak your feet as this can dry your skin.  Trim your toenails straight across. Do not dig under them or around the cuticle. File the edges of your nails with an emery board or nail file.  Apply a moisturizing lotion or petroleum jelly to the skin on your feet and to dry, brittle toenails. Use lotion that does not contain alcohol and is unscented. Do not apply lotion between your toes. Shoes and socks  Wear clean socks or stockings every day. Make sure they are not too tight. Do not wear knee-high stockings since they may decrease blood flow to your legs.  Wear shoes that fit properly and have enough cushioning. Always look in your shoes before you put them on to be sure there are no objects inside.  To break in new shoes, wear them for just a few hours a day. This prevents injuries on your feet. Wounds, scrapes, corns, and calluses  Check your feet daily for blisters, cuts, bruises, sores, and redness. If you cannot see the bottom of your feet, use a mirror or ask someone for help.  Do not cut corns or calluses or try to remove them with medicine.  If you  find a minor scrape, cut, or break in the skin on your feet, keep it and the skin around it clean and dry. You may clean these areas with mild soap and water. Do not clean the area with peroxide, alcohol, or iodine.  If you have a wound, scrape, corn, or callus on your foot, look at it several times a day to make sure it is healing and not infected. Check for: ? Redness, swelling, or pain. ? Fluid or blood. ? Warmth. ? Pus or a bad smell. General instructions  Do not cross your legs. This may decrease blood flow to your feet.  Do not use heating pads or hot water bottles on your feet. They may burn your skin. If you have lost feeling in your feet or legs, you may not know this is happening until it is too late.  Protect your feet from hot and cold by wearing shoes, such as at the beach or on hot pavement.  Schedule a complete foot exam at least once a year (annually) or more often if you have foot problems. If you have foot problems, report any cuts, sores, or bruises to your health care provider immediately. Contact a health care provider if:  You have a medical condition that increases your risk of infection and you have any cuts, sores, or bruises on your feet.  You have an injury that is not   healing.  You have redness on your legs or feet.  You feel burning or tingling in your legs or feet.  You have pain or cramps in your legs and feet.  Your legs or feet are numb.  Your feet always feel cold.  You have pain around a toenail. Get help right away if:  You have a wound, scrape, corn, or callus on your foot and: ? You have pain, swelling, or redness that gets worse. ? You have fluid or blood coming from the wound, scrape, corn, or callus. ? Your wound, scrape, corn, or callus feels warm to the touch. ? You have pus or a bad smell coming from the wound, scrape, corn, or callus. ? You have a fever. ? You have a red line going up your leg. Summary  Check your feet every day  for cuts, sores, red spots, swelling, and blisters.  Moisturize feet and legs daily.  Wear shoes that fit properly and have enough cushioning.  If you have foot problems, report any cuts, sores, or bruises to your health care provider immediately.  Schedule a complete foot exam at least once a year (annually) or more often if you have foot problems. This information is not intended to replace advice given to you by your health care provider. Make sure you discuss any questions you have with your health care provider. Document Revised: 03/10/2019 Document Reviewed: 07/19/2016 Elsevier Patient Education  2020 [employer]  Peripheral Neuropathy Peripheral neuropathy is a type of nerve damage. It affects nerves that carry signals between the spinal cord and the arms, legs, and the rest of the body (peripheral nerves). It does not affect nerves in the spinal cord or brain. In peripheral neuropathy, one nerve or a group of nerves may be damaged. Peripheral neuropathy is a broad category that includes many specific nerve disorders, like diabetic neuropathy, hereditary neuropathy, and carpal tunnel syndrome. What are the causes? This condition may be caused by:  Diabetes. This is the most common cause of peripheral neuropathy.  Nerve injury.  Pressure or stress on a nerve that lasts a long time.  Lack (deficiency) of B vitamins. This can result from alcoholism, poor diet, or a restricted diet.  Infections.  Autoimmune diseases, such as rheumatoid arthritis and systemic lupus erythematosus.  Nerve diseases that are passed from parent to child (inherited).  Some medicines, such as cancer medicines (chemotherapy).  Poisonous (toxic) substances, such as lead and mercury.  Too little blood flowing to the legs.  Kidney disease.  Thyroid disease. In some cases, the cause of this condition is not known. What are the signs or symptoms? Symptoms of this condition depend on which of your  nerves is damaged. Common symptoms include:  Loss of feeling (numbness) in the feet, hands, or both.  Tingling in the feet, hands, or both.  Burning pain.  Very sensitive skin.  Weakness.  Not being able to move a part of the body (paralysis).  Muscle twitching.  Clumsiness or poor coordination.  Loss of balance.  Not being able to control your bladder.  Feeling dizzy.  Sexual problems. How is this diagnosed? Diagnosing and finding the cause of peripheral neuropathy can be difficult. Your health care provider will take your medical history and do a physical exam. A neurological exam will also be done. This involves checking things that are affected by your brain, spinal cord, and nerves (nervous system). For example, your health care provider will check your reflexes, how you move, and   what you can feel. You may have other tests, such as:  Blood tests.  Electromyogram (EMG) and nerve conduction tests. These tests check nerve function and how well the nerves are controlling the muscles.  Imaging tests, such as CT scans or MRI to rule out other causes of your symptoms.  Removing a small piece of nerve to be examined in a lab (nerve biopsy). This is rare.  Removing and examining a small amount of the fluid that surrounds the brain and spinal cord (lumbar puncture). This is rare. How is this treated? Treatment for this condition may involve:  Treating the underlying cause of the neuropathy, such as diabetes, kidney disease, or vitamin deficiencies.  Stopping medicines that can cause neuropathy, such as chemotherapy.  Medicine to relieve pain. Medicines may include: ? Prescription or over-the-counter pain medicine. ? Antiseizure medicine. ? Antidepressants. ? Pain-relieving patches that are applied to painful areas of skin.  Surgery to relieve pressure on a nerve or to destroy a nerve that is causing pain.  Physical therapy to help improve movement and  balance.  Devices to help you move around (assistive devices). Follow these instructions at home: Medicines  Take over-the-counter and prescription medicines only as told by your health care provider. Do not take any other medicines without first asking your health care provider.  Do not drive or use heavy machinery while taking prescription pain medicine. Lifestyle   Do not use any products that contain nicotine or tobacco, such as cigarettes and e-cigarettes. Smoking keeps blood from reaching damaged nerves. If you need help quitting, ask your health care provider.  Avoid or limit alcohol. Too much alcohol can cause a vitamin B deficiency, and vitamin B is needed for healthy nerves.  Eat a healthy diet. This includes: ? Eating foods that are high in fiber, such as fresh fruits and vegetables, whole grains, and beans. ? Limiting foods that are high in fat and processed sugars, such as fried or sweet foods. General instructions   If you have diabetes, work closely with your health care provider to keep your blood sugar under control.  If you have numbness in your feet: ? Check every day for signs of injury or infection. Watch for redness, warmth, and swelling. ? Wear padded socks and comfortable shoes. These help protect your feet.  Develop a good support system. Living with peripheral neuropathy can be stressful. Consider talking with a mental health specialist or joining a support group.  Use assistive devices and attend physical therapy as told by your health care provider. This may include using a walker or a cane.  Keep all follow-up visits as told by your health care provider. This is important. Contact a health care provider if:  You have new signs or symptoms of peripheral neuropathy.  You are struggling emotionally from dealing with peripheral neuropathy.  Your pain is not well-controlled. Get help right away if:  You have an injury or infection that is not healing  normally.  You develop new weakness in an arm or leg.  You fall frequently. Summary  Peripheral neuropathy is when the nerves in the arms, or legs are damaged, resulting in numbness, weakness, or pain.  There are many causes of peripheral neuropathy, including diabetes, pinched nerves, vitamin deficiencies, autoimmune disease, and hereditary conditions.  Diagnosing and finding the cause of peripheral neuropathy can be difficult. Your health care provider will take your medical history, do a physical exam, and do tests, including blood tests and nerve function tests.    Treatment involves treating the underlying cause of the neuropathy and taking medicines to help control pain. Physical therapy and assistive devices may also help. This information is not intended to replace advice given to you by your health care provider. Make sure you discuss any questions you have with your health care provider. Document Revised: 05/30/2017 Document Reviewed: 08/26/2016 Elsevier Patient Education  2020 Else[employer] Inc.  Corns and Calluses Corns are small areas of thickened skin that occur on the top, sides, or tip of a toe. They contain a cone-shaped core with a point that can press on a nerve below. This causes pain.  Calluses are areas of thickened skin that can occur anywhere on the body, including the hands, fingers, palms, soles of the feet, and heels. Calluses are usually larger than corns. What are the causes? Corns and calluses are caused by rubbing (friction) or pressure, such as from shoes that are too tight or do not fit properly. What increases the risk? Corns are more likely to develop in people who have misshapen toes (toe deformities), such as hammer toes. Calluses can occur with friction to any area of the skin. They are more likely to develop in people who:  Work with their hands.  Wear shoes that fit poorly, are too tight, or are high-heeled.  Have toe deformities. What are the signs  or symptoms? Symptoms of a corn or callus include:  A hard growth on the skin.  Pain or tenderness under the skin.  Redness and swelling.  Increased discomfort while wearing tight-fitting shoes, if your feet are affected. If a corn or callus becomes infected, symptoms may include:  Redness and swelling that gets worse.  Pain.  Fluid, blood, or pus draining from the corn or callus. How is this diagnosed? Corns and calluses may be diagnosed based on your symptoms, your medical history, and a physical exam. How is this treated? Treatment for corns and calluses may include:  Removing the cause of the friction or pressure. This may involve: ? Changing your shoes. ? Wearing shoe inserts (orthotics) or other protective layers in your shoes, such as a corn pad. ? Wearing gloves.  Applying medicine to the skin (topical medicine) to help soften skin in the hardened, thickened areas.  Removing layers of dead skin with a file to reduce the size of the corn or callus.  Removing the corn or callus with a scalpel or laser.  Taking antibiotic medicines, if your corn or callus is infected.  Having surgery, if a toe deformity is the cause. Follow these instructions at home:   Take over-the-counter and prescription medicines only as told by your health care provider.  If you were prescribed an antibiotic, take it as told by your health care provider. Do not stop taking it even if your condition starts to improve.  Wear shoes that fit well. Avoid wearing high-heeled shoes and shoes that are too tight or too loose.  Wear any padding, protective layers, gloves, or orthotics as told by your health care provider.  Soak your hands or feet and then use a file or pumice stone to soften your corn or callus. Do this as told by your health care provider.  Check your corn or callus every day for symptoms of infection. Contact a health care provider if you:  Notice that your symptoms do not  improve with treatment.  Have redness or swelling that gets worse.  Notice that your corn or callus becomes painful.  Have fluid, blood, or   pus coming from your corn or callus.  Have new symptoms. Summary  Corns are small areas of thickened skin that occur on the top, sides, or tip of a toe.  Calluses are areas of thickened skin that can occur anywhere on the body, including the hands, fingers, palms, and soles of the feet. Calluses are usually larger than corns.  Corns and calluses are caused by rubbing (friction) or pressure, such as from shoes that are too tight or do not fit properly.  Treatment may include wearing any padding, protective layers, gloves, or orthotics as told by your health care provider. This information is not intended to replace advice given to you by your health care provider. Make sure you discuss any questions you have with your health care provider. Document Revised: 10/07/2018 Document Reviewed: 04/30/2017 Elsevier Patient Education  2020 Elsevier Inc.  

## 2019-10-24 NOTE — Progress Notes (Signed)
Subjective: Michelle [name] presents today for follow up of preventative diabetic foot care and painful mycotic nails b/l that are difficult to trim. Pain interferes with ambulation. Aggravating factors include wearing enclosed shoe gear. Pain is relieved with periodic professional debridement. She voices no new pedal problems on today's visit.  Allergies  Allergen Reactions  . Zofran [Ondansetron Hcl] Other (See Comments)    Prolong QT- pt unsure of this allergy  . Ace Inhibitors Cough    Occurred with liinopril  . Levofloxacin Other (See Comments)    Felt things on her legs that were not there,made her stomach hurt  . Lisinopril Cough  . Sulfonamide Derivatives Hives and Itching     Objective: There were no vitals filed for this visit.  Pt is a pleasant 69 y.o. year old Caucasian female WD, WN in NAD. AAO x 3.   Vascular Examination:  Capillary refill time to digits immediate b/l. Palpable DP pulses b/l. Palpable PT pulses b/l. Pedal hair sparse b/l. Skin temperature gradient within normal limits b/l. No edema noted b/l.  Dermatological Examination: Pedal skin with normal turgor, texture and tone bilaterally. No open wounds bilaterally. No interdigital macerations bilaterally. Toenails 1-5 b/l elongated, dystrophic, thickened, crumbly with subungual debris and tenderness to dorsal palpation.  Resolved hyperkeratotic lesion distal tip right 3rd digit.  Musculoskeletal: Normal muscle strength 5/5 to all lower extremity muscle groups bilaterally, no gross bony deformities bilaterally and no pain crepitus or joint limitation noted with ROM b/l.  Neurological: Protective sensation intact 5/5 intact bilaterally with 10g monofilament b/l. Vibratory sensation intact b/l. Proprioception intact bilaterally. Babinski reflex negative b/l. Clonus negative b/l.  Assessment: 1. Pain due to onychomycosis of toenails of both feet    Plan: -Continue diabetic foot care principles. Literature  dispensed on today.  -Toenails 1-5 b/l were debrided in length and girth with sterile nail nippers and dremel without iatrogenic bleeding.  -Patient to continue soft, supportive shoe gear daily. -Patient to report any pedal injuries to medical professional immediately. -Patient/POA to call should there be question/concern in the interim.  Return in about 4 months (around 02/19/2020) for diabetic nail and callus trim.

## 2019-10-29 DIAGNOSIS — N3000 Acute cystitis without hematuria: Secondary | ICD-10-CM | POA: Diagnosis not present

## 2019-10-29 DIAGNOSIS — R8271 Bacteriuria: Secondary | ICD-10-CM | POA: Diagnosis not present

## 2019-11-17 DIAGNOSIS — M059 Rheumatoid arthritis with rheumatoid factor, unspecified: Secondary | ICD-10-CM | POA: Diagnosis not present

## 2019-11-26 DIAGNOSIS — N3000 Acute cystitis without hematuria: Secondary | ICD-10-CM | POA: Diagnosis not present

## 2019-11-26 DIAGNOSIS — N3941 Urge incontinence: Secondary | ICD-10-CM | POA: Diagnosis not present

## 2019-12-01 ENCOUNTER — Ambulatory Visit (INDEPENDENT_AMBULATORY_CARE_PROVIDER_SITE_OTHER): Payer: Medicare Other | Admitting: Family Medicine

## 2019-12-01 ENCOUNTER — Encounter: Payer: Self-pay | Admitting: Family Medicine

## 2019-12-01 ENCOUNTER — Other Ambulatory Visit: Payer: Self-pay

## 2019-12-01 VITALS — BP 120/64 | HR 101 | Ht 63.0 in | Wt 143.0 lb

## 2019-12-01 DIAGNOSIS — J45909 Unspecified asthma, uncomplicated: Secondary | ICD-10-CM | POA: Diagnosis not present

## 2019-12-01 DIAGNOSIS — M25531 Pain in right wrist: Secondary | ICD-10-CM | POA: Diagnosis not present

## 2019-12-01 DIAGNOSIS — M25552 Pain in left hip: Secondary | ICD-10-CM

## 2019-12-01 DIAGNOSIS — M160 Bilateral primary osteoarthritis of hip: Secondary | ICD-10-CM

## 2019-12-01 DIAGNOSIS — E119 Type 2 diabetes mellitus without complications: Secondary | ICD-10-CM

## 2019-12-01 DIAGNOSIS — M069 Rheumatoid arthritis, unspecified: Secondary | ICD-10-CM | POA: Diagnosis not present

## 2019-12-01 DIAGNOSIS — G894 Chronic pain syndrome: Secondary | ICD-10-CM

## 2019-12-01 DIAGNOSIS — N3281 Overactive bladder: Secondary | ICD-10-CM

## 2019-12-01 DIAGNOSIS — N3941 Urge incontinence: Secondary | ICD-10-CM | POA: Diagnosis not present

## 2019-12-01 LAB — POCT GLYCOSYLATED HEMOGLOBIN (HGB A1C): HbA1c, POC (controlled diabetic range): 7.4 % — AB (ref 0.0–7.0)

## 2019-12-01 NOTE — Progress Notes (Signed)
SUBJECTIVE:   CHIEF COMPLAINT / HPI:   Diabetes: She states she has been "trying" to eat well, but there have been a lot of birthday parties recently for her grandchildren and she has been eating a lot of cake.  She has been taking her medication as prescribed.  Her ophthalmologist is [facility] ophthalmology and she says she has recently had a diabetic retinopathy exam within the past year.  arthritis: Patient's pain is well controlled.  She would like to switch pharmacies to [facility] on [address].      Right wrist: Patient continues to have swelling on the lateral side of her right wrist.  It is painful when she lifts objects.  She tried prednisone, and has been to the sports medicine doctor who said it was not a good area for an injection.  She tried a wrist brace for 1 week.  She would take it off to do dishes and other things but or it "most of the time"  Left hip: Patient has pain in her left hip even with taking the pain medication she is currently on.  Pain usually occurs after she has been on her feet for an hour.  Patient has been prescribed a new medication, Myrbetriq, for overactive bladder by her urologist on [facility].  She is on a new medication. Myrbetriq.  Urologist.  On [address].     PERTINENT  PMH / PSH: Rheumatoid, osteoarthritis.  OBJECTIVE:   BP 120/64   Pulse (!) 101   Ht 5\' 3"  (1.6 m)   Wt 143 lb (64.9 kg)   SpO2 96%   BMI 25.33 kg/m   General: Alert and oriented.  No acute distress. CV: Regular rate and rhythm.  No murmurs Pulmonary: Lungs clear to auscultation bilaterally.  No wheezing. MSK: Mild swelling over the ulnar aspect of the right wrist.  No erythema.  Minimal tenderness to palpation.  Negative straight leg test.  Decreased range of motion with left internal rotation of the hip.  ASSESSMENT/PLAN:   Diabetes mellitus without complication (HCC) 123456 7.4.  Is still in an acceptable range for good control despite her admission that she is  eating too many sweets.  We will need to get her records from [facility] ophthalmology for diabetic retinopathy screening.  Foot exam performed today, which was normal. -Continue current regiment.  Continue to advise patient the importance of limiting sugar intake  Rheumatoid arthritis ([place]) Patient continues to have good quality of life with the current dosing regimen, which she has maintained for for the past 8 years according to her health records.  Sees rheumatology for treatment for this.  Currently on Arava and Remicade every 8 weeks.  I suspect her rheumatoid arthritis is playing some role in the right wrist pain that has failed to improve with steroids and splinting. -Refill for next 3 months -Follow-up in 3 months. -Follow-up wrist x-ray  Urge incontinence of urine Patient states that she was prescribed Myrbetriq recently by her urologist.  I do not have records of this. -Obtain records from urologist to verify dosage.  Left hip pain Patient complains of new hip pain that does not respond to her regular pain regimen.  States that this is a new pain, but going through her history appears to have this issue going back several years.  Most likely arthritis, but may be chronic tendinopathy.  Will get x-rays to further evaluate.  Patient limited in how we can treat this pain given that she  is already on a intensive chronic pain regiment.     [name], MD [place]

## 2019-12-01 NOTE — Patient Instructions (Signed)
It was nice to see you again today,  Your A1c was 7.4.  This is still in the good range.  Try to limit your sweets to keep it from getting too high again.  For your right wrist, I will get an x-ray.  You are also getting an x-ray of your hips.  I am going to assume based on your physical exam that your osteoarthritis of your hips has gotten worse.  I think you would benefit from talking with your orthopedist again, but I would like to wait to get the x-ray results first.  At that time if you do see him, you can discuss the wrist pain as well.  I will refill all of the medications you asked for at your new pharmacy.  Please make another appointment for 3 months from now.  Have a great day,  Clemetine Marker, MD

## 2019-12-02 ENCOUNTER — Ambulatory Visit
Admission: RE | Admit: 2019-12-02 | Discharge: 2019-12-02 | Disposition: A | Discharge status: Home / Self Care | Payer: Medicare Other | Source: Ambulatory Visit | Attending: Family Medicine | Admitting: Family Medicine

## 2019-12-02 DIAGNOSIS — M25531 Pain in right wrist: Secondary | ICD-10-CM

## 2019-12-02 DIAGNOSIS — M25552 Pain in left hip: Secondary | ICD-10-CM | POA: Diagnosis not present

## 2019-12-02 DIAGNOSIS — M160 Bilateral primary osteoarthritis of hip: Secondary | ICD-10-CM

## 2019-12-02 MED ORDER — OMEPRAZOLE 40 MG PO CPDR: 40.0000 mg | DELAYED_RELEASE_CAPSULE | Freq: Two times a day (BID) | ORAL | 3 refills | Status: DC

## 2019-12-02 MED ORDER — OXYCODONE-ACETAMINOPHEN 10-325 MG PO TABS: 1.0000 | ORAL_TABLET | Freq: Four times a day (QID) | ORAL | 0 refills | Status: DC | PRN

## 2019-12-02 MED ORDER — VENTOLIN HFA 108 (90 BASE) MCG/ACT IN AERS: INHALATION_SPRAY | RESPIRATORY_TRACT | 11 refills | Status: DC

## 2019-12-02 MED ORDER — FLUTICASONE PROPIONATE 50 MCG/ACT NA SUSP: 2.0000 | Freq: Every day | NASAL | 3 refills | Status: DC | PRN

## 2019-12-02 MED ORDER — SERTRALINE HCL 50 MG PO TABS: 50.0000 mg | ORAL_TABLET | Freq: Every day | ORAL | 3 refills | Status: DC

## 2019-12-02 MED ORDER — GABAPENTIN 300 MG PO CAPS: 300.0000 mg | ORAL_CAPSULE | Freq: Every day | ORAL | 3 refills | Status: DC

## 2019-12-02 MED ORDER — ROSUVASTATIN CALCIUM 10 MG PO TABS: 10.0000 mg | ORAL_TABLET | Freq: Every day | ORAL | 3 refills | Status: DC

## 2019-12-02 MED ORDER — LOSARTAN POTASSIUM 25 MG PO TABS: ORAL_TABLET | ORAL | 3 refills | Status: DC

## 2019-12-02 MED ORDER — METFORMIN HCL 1000 MG PO TABS: 1000.0000 mg | ORAL_TABLET | Freq: Two times a day (BID) | ORAL | 3 refills | Status: DC

## 2019-12-02 MED ORDER — TRULICITY 1.5 MG/0.5ML ~~LOC~~ SOAJ: SUBCUTANEOUS | 11 refills | Status: DC

## 2019-12-05 ENCOUNTER — Telehealth: Payer: Self-pay | Admitting: Family Medicine

## 2019-12-05 DIAGNOSIS — N3281 Overactive bladder: Secondary | ICD-10-CM | POA: Insufficient documentation

## 2019-12-05 NOTE — Assessment & Plan Note (Signed)
A1c 7.4.  Is still in an acceptable range for good control despite her admission that she is eating too many sweets.  We will need to get her records from [facility] ophthalmology for diabetic retinopathy screening.  Foot exam performed today, which was normal. -Continue current regiment.  Continue to advise patient the importance of limiting sugar intake

## 2019-12-05 NOTE — Assessment & Plan Note (Deleted)
Patient states that she was prescribed Myrbetriq recently by her urologist.  I do not have records of this. -Obtain records from urologist to verify dosage.

## 2019-12-05 NOTE — Assessment & Plan Note (Signed)
Patient complains of new hip pain that does not respond to her regular pain regimen.  States that this is a new pain, but going through her history appears to have this issue going back several years.  Most likely arthritis, but may be chronic tendinopathy.  Will get x-rays to further evaluate.  Patient limited in how we can treat this pain given that she is already on a intensive chronic pain regiment.

## 2019-12-05 NOTE — Assessment & Plan Note (Signed)
Patient continues to have good quality of life with the current dosing regimen, which she has maintained for for the past 8 years according to her health records.  Sees rheumatology for treatment for this.  Currently on Arava and Remicade every 8 weeks.  I suspect her rheumatoid arthritis is playing some role in the right wrist pain that has failed to improve with steroids and splinting. -Refill for next 3 months -Follow-up in 3 months. -Follow-up wrist x-ray

## 2019-12-05 NOTE — Telephone Encounter (Signed)
Erroneous encounter

## 2019-12-05 NOTE — Assessment & Plan Note (Signed)
Patient states that she was prescribed Myrbetriq recently by her urologist.  I do not have records of this. -Obtain records from urologist to verify dosage.

## 2019-12-07 ENCOUNTER — Ambulatory Visit (INDEPENDENT_AMBULATORY_CARE_PROVIDER_SITE_OTHER): Payer: Medicare Other | Admitting: Sports Medicine

## 2019-12-07 ENCOUNTER — Other Ambulatory Visit: Payer: Self-pay | Admitting: Sports Medicine

## 2019-12-07 ENCOUNTER — Ambulatory Visit
Admission: RE | Admit: 2019-12-07 | Discharge: 2019-12-07 | Disposition: A | Discharge status: Home / Self Care | Payer: Medicare Other | Source: Ambulatory Visit | Attending: Sports Medicine | Admitting: Sports Medicine

## 2019-12-07 ENCOUNTER — Other Ambulatory Visit: Payer: Self-pay

## 2019-12-07 VITALS — BP 149/81 | Ht 63.0 in | Wt 143.0 lb

## 2019-12-07 DIAGNOSIS — R0781 Pleurodynia: Secondary | ICD-10-CM

## 2019-12-07 NOTE — Progress Notes (Signed)
   Subjective:    Patient ID: Michelle [name], female    DOB: [birth year], 69 y.o.   MRN: [id number]  HPI chief complaint: Rib pain  Michelle [name] comes in today complaining of 1 week of left-sided rib pain.  Pain began acutely while leaning over a washing machine.  She felt a pop and has had pain ever since.  She has pain with direct palpation as well as with deep breathing.  Difficulty sleeping at night as well.  She had a similar episode about 6 months ago which resolved rather quickly on its own without any specific treatment.  She takes oxycodone chronically and has been taking that for her rib pain.  She also tried Flexeril last night but it was not very helpful.    Review of Systems As above    Objective:   Physical Exam  Well-developed, well-nourished.  No acute distress.  Breathing is nonlabored.  Examination of the ribs show tenderness to palpation diffusely along the left lower rib cage.  No bruising.  No swelling.  X-rays of the left rib cage with marker in place show no obvious fracture      Assessment & Plan:   Left-sided rib pain likely secondary to costochondral strain  Reassurance regarding x-ray findings.  Rib belt to be worn for comfort.  Symptoms should subside over the next 2 to 3 weeks.  Follow-up for ongoing or recalcitrant issues.

## 2019-12-13 ENCOUNTER — Telehealth: Payer: Self-pay | Admitting: *Deleted

## 2019-12-13 NOTE — Telephone Encounter (Signed)
Patient calls because she normally gets 6 inhalers / month and this time she only got 2.  Called and spoke with Merrilee Seashore @ [facility], He only filled a one month supply due to the amount that she uses per month.    He wants to make sure that pt should be receiving 6 inhalers for a 3 month supply.  To PCP. Christen Bame, CMA

## 2019-12-14 NOTE — Progress Notes (Signed)
Assessment/Plan:    1.  Essential Tremor  -This is worsened by medications for her lungs.  Her left hand is worse than the right.  -Continue primidone, 50 mg, 2 tablets in the morning and 1 tablet at night  2.  B12 deficiency  -Continue supplementation.  In the past, when she stopped the supplement, her level went back down.  3.  Memory change  -suspect MCI and possibly pseudodementia from mood/depression.  meds may be contributing as well.  -we will schedule for neurocog testing.  4.  We will return her to full care of her primary care physician as long as no dementia on neurocog testing.  If she needs me in the future, she can let us know.  Subjective:   Michelle [name] was seen today in follow up for essential tremor.  My previous records were reviewed prior to todays visit. Pt had a fall - she slipped on a wet leaf a few months ago.  She is doing better with tremor.    Pt denies lightheadedness, near syncope.  No hallucinations.  Mood has been good but "bored."  Doesn't think that memory is as good as used to be.  She writes down things for reminders.  She remembers her medications.  She has word finding troubles.  She isn't exercising.  Current prescribed movement disorder medications: Primidone, 50 mg, 2 tabs in the morning and 1 tablet at night.  ALLERGIES:   Allergies  Allergen Reactions  . Zofran [Ondansetron Hcl] Other (See Comments)    Prolong QT- pt unsure of this allergy  . Ace Inhibitors Cough    Occurred with liinopril  . Levofloxacin Other (See Comments)    Felt things on her legs that were not there,made her stomach hurt  . Lisinopril Cough  . Sulfonamide Derivatives Hives and Itching    CURRENT MEDICATIONS:  Outpatient Encounter Medications as of 12/20/2019  Medication Sig  . budesonide-formoterol (SYMBICORT) 160-4.5 MCG/ACT inhaler Inhale 2 puffs into the lungs 2 (two) times daily.  . cetirizine (EQ ALLERGY RELIEF, CETIRIZINE,) 10 MG tablet Take 1  tablet (10 mg total) by mouth at bedtime.  . chlorhexidine (PERIDEX) 0.12 % solution Use as directed 15 mLs in the mouth or throat 2 (two) times daily.  . cyclobenzaprine (FLEXERIL) 10 MG tablet Take 10 mg by mouth 3 (three) times daily.  . diclofenac sodium (VOLTAREN) 1 % GEL Apply 2 g topically 4 (four) times daily as needed (for pain).  Marland Kitchen donepezil (ARICEPT) 5 MG tablet Take 5 mg by mouth at bedtime.   . Dulaglutide (TRULICITY) 1.5 EG/3.1DV SOPN INJECT 1.5 MG INTO THE SKIN ONCE A WEEK  . ferrous sulfate 325 (65 FE) MG tablet Take 1 tablet (325 mg total) by mouth daily with breakfast.  . fluticasone (FLONASE) 50 MCG/ACT nasal spray Place 2 sprays into both nostrils daily as needed for allergies.  Marland Kitchen gabapentin (NEURONTIN) 300 MG capsule Take 1 capsule (300 mg total) by mouth at bedtime.  Marland Kitchen glucose blood (ACCU-CHEK AVIVA) test strip Use to check blood sugar three times daily or as directed. Dx code: E11.9  . hydrocortisone 1 % lotion Apply 1 application topically 2 (two) times daily.  . hydrOXYzine (ATARAX/VISTARIL) 25 MG tablet Take 25-50 mg by mouth at bedtime as needed.  . inFLIXimab (REMICADE) 100 MG injection Inject 100 mg into the vein every 7 (seven) weeks.   Marland Kitchen ketorolac (ACULAR) 0.5 % ophthalmic solution Place 1 drop into the right eye 4 (four)  times daily.  Marland Kitchen leflunomide (ARAVA) 10 MG tablet Take 10 mg by mouth daily.  Marland Kitchen losartan (COZAAR) 25 MG tablet Take 1/2 (one-half) tablet by mouth once daily  . metFORMIN (GLUCOPHAGE) 1000 MG tablet Take 1 tablet (1,000 mg total) by mouth 2 (two) times daily with a meal.  . mometasone (ELOCON) 0.1 % cream APPLY CREAM TOPICALLY TO AFFECTED AREA TWICE DAILY. FACE AND ARMS  . montelukast (SINGULAIR) 10 MG tablet Take by mouth.  . nitroGLYCERIN (NITROSTAT) 0.4 MG SL tablet Place 1 tablet (0.4 mg total) under the tongue every 5 (five) minutes as needed.  Marland Kitchen omeprazole (PRILOSEC) 40 MG capsule Take 1 capsule (40 mg total) by mouth 2 (two) times daily.  Marland Kitchen  oxyCODONE-acetaminophen (PERCOCET) 10-325 MG tablet Take 1 tablet by mouth every 6 (six) hours as needed for pain.  . Pramoxine HCl 1 % CREA Apply 1 Dose topically as directed.  . primidone (MYSOLINE) 50 MG tablet TAKE 2 TABLETS BY MOUTH ONCE DAILY IN THE MORNING AND 1 IN THE EVENING  . rosuvastatin (CRESTOR) 10 MG tablet Take 1 tablet (10 mg total) by mouth daily.  . sertraline (ZOLOFT) 50 MG tablet Take 1 tablet (50 mg total) by mouth daily.  . VENTOLIN HFA 108 (90 Base) MCG/ACT inhaler INHALE 2 PUFFS BY MOUTH EVERY 4 HOURS AS NEEDED FOR WHEEZING OR SHORTNESS OF BREATH  . baclofen (LIORESAL) 10 MG tablet Take 1 tablet (10 mg total) by mouth 3 (three) times daily as needed for muscle spasms. (Patient not taking: Reported on 12/20/2019)  . [DISCONTINUED] aspirin EC 81 MG tablet Take 1 tablet (81 mg total) by mouth daily. (Patient not taking: Reported on 12/20/2019)  . [DISCONTINUED] azelastine (OPTIVAR) 0.05 % ophthalmic solution Place 1 drop into both eyes 2 (two) times daily as needed (itchy eyes.).  (Patient not taking: Reported on 12/20/2019)  . [DISCONTINUED] famotidine (PEPCID) 20 MG tablet Take 20 mg by mouth 2 (two) times daily. (Patient not taking: Reported on 12/20/2019)  . [DISCONTINUED] FLUZONE HIGH-DOSE QUADRIVALENT 0.7 ML SUSY TO BE ADMINISTERED BY PHARMACIST FOR IMMUNIZATION (Patient not taking: Reported on 12/20/2019)  . [DISCONTINUED] ofloxacin (OCUFLOX) 0.3 % ophthalmic solution Place 1 drop into the right eye 4 (four) times daily. (Patient not taking: Reported on 12/20/2019)  . [DISCONTINUED] ONGLYZA 5 MG TABS tablet Bottle (Patient not taking: Reported on 12/20/2019)  . [DISCONTINUED] oxyCODONE-acetaminophen (PERCOCET) 10-325 MG tablet Take 1 tablet by mouth every 6 (six) hours as needed for pain. (Patient not taking: Reported on 12/20/2019)  . [DISCONTINUED] oxyCODONE-acetaminophen (PERCOCET) 10-325 MG tablet Take 1 tablet by mouth every 6 (six) hours as needed for pain. (Patient not  taking: Reported on 12/20/2019)  . [DISCONTINUED] prednisoLONE acetate (PRED FORTE) 1 % ophthalmic suspension 1 drop Right Eye 4xday 1wk, 3xday 1wk, 2xday 1wk, 1xday til next ap (Patient not taking: Reported on 12/20/2019)  . [DISCONTINUED] predniSONE (STERAPRED UNI-PAK 21 TAB) 10 MG (21) TBPK tablet 6-day dose pak Take as directed (Patient not taking: Reported on 12/20/2019)  . [DISCONTINUED] VENTOLIN HFA 108 (90 Base) MCG/ACT inhaler INHALE 2 PUFFS BY MOUTH EVERY 4 HOURS AS NEEDED FOR WHEEZING OR SHORTNESS OF BREATH   No facility-administered encounter medications on file as of 12/20/2019.     Objective:    PHYSICAL EXAMINATION:    VITALS:   Vitals:   12/20/19 0803  BP: (!) 144/79  Pulse: 88  SpO2: 95%  Weight: 144 lb (65.3 kg)  Height: 5\' 3"  (1.6 m)    GEN:  The patient appears stated age and is in NAD. HEENT:  Normocephalic, atraumatic.  The mucous membranes are moist. The superficial temporal arteries are without ropiness or tenderness. CV:  RRR Lungs:  CTAB Neck/HEME:  There are no carotid bruits bilaterally.  Neurological examination:  Orientation: The patient is alert and oriented x3. Cranial nerves: There is good facial symmetry. The speech is fluent and clear. Soft palate rises symmetrically and there is no tongue deviation. Hearing is intact to conversational tone. Sensation: Sensation is intact to light touch throughout Motor: Strength is at least antigravity x4.  Movement examination: Tone: There is normal tone in the UE/LE Abnormal movements: rare intention tremor on the L.  she has no difficulty with archimedes spirals. Coordination:  There is no decremation with RAM's, with any form of RAMS, including alternating supination and pronation of the forearm, hand opening and closing, finger taps, heel taps and toe taps.  I have reviewed and interpreted the following labs independently   Chemistry      Component Value Date/Time   NA 138 02/16/2019 0941   K 4.5  02/16/2019 0941   CL 99 02/16/2019 0941   CO2 23 02/16/2019 0941   BUN 7 (L) 02/16/2019 0941   CREATININE 0.52 (L) 02/16/2019 0941   CREATININE 0.46 (L) 07/22/2016 1026      Component Value Date/Time   CALCIUM 9.5 02/16/2019 0941   ALKPHOS 107 02/16/2019 0941   AST 35 02/16/2019 0941   ALT 26 02/16/2019 0941   BILITOT 0.4 02/16/2019 0941      Lab Results  Component Value Date   WBC 9.6 07/13/2019   HGB 12.0 07/13/2019   HCT 35.4 07/13/2019   MCV 88 07/13/2019   PLT 184 04/08/2018   Lab Results  Component Value Date   TSH 1.320 07/13/2019     Chemistry      Component Value Date/Time   NA 138 02/16/2019 0941   K 4.5 02/16/2019 0941   CL 99 02/16/2019 0941   CO2 23 02/16/2019 0941   BUN 7 (L) 02/16/2019 0941   CREATININE 0.52 (L) 02/16/2019 0941   CREATININE 0.46 (L) 07/22/2016 1026      Component Value Date/Time   CALCIUM 9.5 02/16/2019 0941   ALKPHOS 107 02/16/2019 0941   AST 35 02/16/2019 0941   ALT 26 02/16/2019 0941   BILITOT 0.4 02/16/2019 0941     Lab Results  Component Value Date   VITAMINB12 394 09/16/2018       Total time spent on today's visit was 30 minutes, including both face-to-face time and nonface-to-face time.  Time included that spent on review of records (prior notes available to me/labs/imaging if pertinent), discussing treatment and goals, answering patient's questions and coordinating care.  Cc:  [name], MD

## 2019-12-16 ENCOUNTER — Other Ambulatory Visit: Payer: Self-pay | Admitting: Family Medicine

## 2019-12-16 DIAGNOSIS — J45909 Unspecified asthma, uncomplicated: Secondary | ICD-10-CM

## 2019-12-16 MED ORDER — VENTOLIN HFA 108 (90 BASE) MCG/ACT IN AERS: INHALATION_SPRAY | RESPIRATORY_TRACT | 11 refills | Status: DC

## 2019-12-20 ENCOUNTER — Encounter: Payer: Self-pay | Admitting: Neurology

## 2019-12-20 ENCOUNTER — Other Ambulatory Visit: Payer: Self-pay

## 2019-12-20 ENCOUNTER — Ambulatory Visit (INDEPENDENT_AMBULATORY_CARE_PROVIDER_SITE_OTHER): Payer: Medicare Other | Admitting: Neurology

## 2019-12-20 VITALS — BP 144/79 | HR 88 | Ht 63.0 in | Wt 144.0 lb

## 2019-12-20 DIAGNOSIS — R413 Other amnesia: Secondary | ICD-10-CM | POA: Diagnosis not present

## 2019-12-20 DIAGNOSIS — G250 Essential tremor: Secondary | ICD-10-CM | POA: Diagnosis not present

## 2019-12-20 MED ORDER — PRIMIDONE 50 MG PO TABS: ORAL_TABLET | ORAL | 2 refills | Status: DC

## 2019-12-20 NOTE — Patient Instructions (Signed)
You have been referred for a neurocognitive evaluation in our office.   The evaluation has two parts.   . The first part of the evaluation is a clinical interview with the neuropsychologist (Dr. Merz or Dr. Stewart). Please bring someone with you to this appointment if possible, as it is helpful for the doctor to hear from both you and another adult who knows you well.   . The second part of the evaluation is testing with the doctor's technician (Dana or Kim). The testing includes a variety of tasks- mostly question-and-answer, some paper-and-pencil. There is nothing you need to do to prepare for this appointment, but having a good night's sleep prior to the testing, taking medications as you normally would, and bringing eyeglasses and hearing aids (if you wear them), is advised. Please make sure that you wear a mask to the appointment.  Please note: We have to reserve several hours of the neuropsychologist's time and the psychometrician's time for your evaluation appointment. As such, please note that there is a No-Show fee of $100. If you are unable to attend any of your appointments, please contact our office as soon as possible to reschedule.  

## 2019-12-23 ENCOUNTER — Other Ambulatory Visit: Payer: Self-pay

## 2019-12-24 ENCOUNTER — Other Ambulatory Visit: Payer: Self-pay | Admitting: *Deleted

## 2019-12-24 MED ORDER — ACCU-CHEK AVIVA PLUS VI STRP: ORAL_STRIP | 12 refills | Status: DC

## 2019-12-24 NOTE — Telephone Encounter (Signed)
Received a refill request for pts ACCU-CHEK plus test strips.  Tried to reorder and it says that that rx is no longer good need to send in a new Rx. To the [facility] [address]. Dajah Fischman Zimmerman Rumple, CMA

## 2020-01-10 MED ORDER — ACCU-CHEK AVIVA PLUS VI STRP: ORAL_STRIP | 3 refills | Status: DC

## 2020-01-10 NOTE — Addendum Note (Signed)
Addended by: Christen Bame D on: 01/10/2020 12:21 PM   Modules accepted: Orders

## 2020-01-10 NOTE — Telephone Encounter (Signed)
Patient calls back. There needs to be specific instructions in order for her to get the strips.  I have changed the order in new request, to PCP to ok and sign.  Christen Bame, CMA

## 2020-01-12 DIAGNOSIS — Z79899 Other long term (current) drug therapy: Secondary | ICD-10-CM | POA: Diagnosis not present

## 2020-01-12 DIAGNOSIS — M059 Rheumatoid arthritis with rheumatoid factor, unspecified: Secondary | ICD-10-CM | POA: Diagnosis not present

## 2020-01-17 DIAGNOSIS — H1045 Other chronic allergic conjunctivitis: Secondary | ICD-10-CM | POA: Diagnosis not present

## 2020-01-17 DIAGNOSIS — Z961 Presence of intraocular lens: Secondary | ICD-10-CM | POA: Diagnosis not present

## 2020-01-17 DIAGNOSIS — H59021 Cataract (lens) fragments in eye following cataract surgery, right eye: Secondary | ICD-10-CM | POA: Diagnosis not present

## 2020-01-18 DIAGNOSIS — R3915 Urgency of urination: Secondary | ICD-10-CM | POA: Diagnosis not present

## 2020-01-18 DIAGNOSIS — N3941 Urge incontinence: Secondary | ICD-10-CM | POA: Diagnosis not present

## 2020-01-26 ENCOUNTER — Other Ambulatory Visit: Payer: Self-pay

## 2020-01-26 ENCOUNTER — Encounter: Payer: Self-pay | Admitting: Psychology

## 2020-01-26 ENCOUNTER — Ambulatory Visit (INDEPENDENT_AMBULATORY_CARE_PROVIDER_SITE_OTHER): Payer: Medicare Other | Admitting: Psychology

## 2020-01-26 ENCOUNTER — Ambulatory Visit: Payer: Medicare Other

## 2020-01-26 DIAGNOSIS — F411 Generalized anxiety disorder: Secondary | ICD-10-CM | POA: Diagnosis not present

## 2020-01-26 DIAGNOSIS — R4189 Other symptoms and signs involving cognitive functions and awareness: Secondary | ICD-10-CM

## 2020-01-26 DIAGNOSIS — R413 Other amnesia: Secondary | ICD-10-CM

## 2020-01-26 NOTE — Progress Notes (Signed)
NEUROPSYCHOLOGICAL EVALUATION [place]. [place] of Neurology  Date of Evaluation: January 26, 2020  Reason for Referral:   Michelle [name] is a 69 y.o. right-handed Caucasian female referred by Alonza Bogus, D.O., to characterize her current cognitive functioning and assist with diagnostic clarity and treatment planning in the context of subjective cognitive decline and history of essential tremor.  Assessment and Plan:   Clinical Impression(s): Overall, Michelle [name]'s pattern of performance is suggestive of neuropsychological functioning largely within appropriate normative ranges. Isolated weaknesses were seen across recognition discriminability of a list learning test, as well as certain aspects of a complex task assessing problem solving and adaptability. Regarding the former, performance across other recognition discriminability measures were strong, as was performance across memory tasks as a whole. Regarding the latter, performances across other aspects of this task were appropriate, as was all other assessments of executive functioning. As such, neither of these relative weaknesses is cause for concern. Overall, I do not feel that these weaknesses arise to where a diagnosis of a mild neurocognitive disorder is warranted at the present time and Michelle [name] denied difficulties completing instrumental activities of daily living (ADLs) independently.  Across mood-related questionnaires, she endorsed mild symptoms of anxiety while symptoms of depression were within normal limits. Factors which can create and maintain cognitive inefficiencies include psychiatric distress (e.g., anxiety and depression), various psychosocial stressors, chronic pain, and medication side effects. It is likely that a combination of these factors are responsible for day-to-day cognitive difficulties which [name] has been experiencing presently. Specific to memory, Michelle [name] was able to  learn novel verbal and visual information efficiently and retain this knowledge after lengthy delays. Overall, memory performance combined with intact performances across other areas of cognitive functioning (e.g., confrontation naming, semantic fluency, visuoperceptual abilities) is not suggestive of an underlying neurodegenerative condition affecting memory processes (e.g., Alzheimer's disease). Likewise, her current cognitive and behavioral profile is not suggestive of any other neurodegenerative illnesses such as Lewy body or frontotemporal dementia. Continued medical monitoring will be important moving forward.  Recommendations: A repeat neuropsychological evaluation in 24 months (or sooner if functional decline is noted) is recommended to assess the trajectory of future cognitive decline should it occur. This will also aid in future efforts towards improved diagnostic clarity.  If ongoing sleep disruptions are brought on solely by her needing to get up in the middle of the night to place her dog back on the bed after he/she has jumped off as were described, Michelle [name] could consider purchasing a small ladder to put at the base of the bed from a local pet store. This would allow her dog to come and go and his/her leisure and not wake her up in the middle of the night.   A combination of medication and psychotherapy has been shown to be most effective at treating symptoms of anxiety and depression. Should she feel that symptoms of anxiety are not optimally managed, she would be encouraged to speak with her prescribing physician regarding medication adjustments to optimally manage these symptoms. Likewise, Michelle [name] could also consider engaging in short-term psychotherapy to address symptoms of psychiatric distress. She would benefit from an active and collaborative therapeutic environment, rather than one purely supportive in nature. Recommended treatment modalities include Cognitive Behavioral Therapy  (CBT) or Acceptance and Commitment Therapy (ACT).  Michelle [name] is encouraged to attend to lifestyle factors for brain health (e.g., regular physical exercise, good nutrition habits, regular participation in cognitively-stimulating activities,  and general stress management techniques), which are likely to have benefits for both emotional adjustment and cognition. In fact, in addition to promoting good general health, regular exercise incorporating aerobic activities (e.g., brisk walking, jogging, cycling, etc.) has been demonstrated to be a very effective treatment for depression and stress, with similar efficacy rates to both antidepressant medication and psychotherapy. Optimal control of vascular risk factors (including safe cardiovascular exercise and adherence to dietary recommendations) is encouraged.   If interested, there are some activities which have therapeutic value and can be useful in keeping her cognitively stimulated. For suggestions, Michelle [name] is encouraged to go to the following website: https://www.[facility].org/get-to-know-barrow/centers-programs/neurorehabilitation-center/neuro-rehab-apps-and-games/ which has options, categorized by level of difficulty. It should be noted that these activities should not be viewed as a substitute for therapy.  When learning new information, she would benefit from information being broken up into small, manageable pieces. She may also find it helpful to articulate the material in her own words and in a context to promote encoding at the onset of a new task. This material may need to be repeated multiple times to promote encoding.  Memory can be improved using internal strategies such as rehearsal, repetition, chunking, mnemonics, association, and imagery. External strategies such as written notes in a consistently used memory journal, visual and nonverbal auditory cues such as a calendar on the refrigerator or appointments with alarm, such as on a cell  phone, can also help maximize recall.    To address problems with attention or executive dysfunction, she may wish to consider:   -Avoiding external distractions when needing to concentrate   -Limiting exposure to fast paced environments with multiple sensory demands   -Writing down complicated information and using checklists   -Attempting and completing one task at a time (i.e., no multi-tasking)   -Verbalizing aloud each step of a task to maintain focus   -Reducing the amount of information considered at one time  Review of Records:   Ms. Lamphear was seen by [facility] Wells Guiles Tat, D.O.) on 12/20/2019 for follow-up of essential tremor. Dr. Carles Collet noted that Ms. Torain had fallen a few months prior due to slipping on wet leaves. Her tremor was said to have been improved (currently taking primidone, 50 mg, 2 tablets in the morning and 1 tablet at night) but that tremulous symptoms were made worse by lung-related medications. Her left hand continues to be worse than her right. Mood was described as good but "bored" and hallucinations were denied. Ms. Heather did report some ongoing short-term memory concerns (has become more reliant on writing things down) and word finding difficulties. As such, she was referred for a comprehensive neuropsychological evaluation to characterize her cognitive abilities and to assist with diagnostic clarity and treatment planning.   Brain MRI on 02/15/2013 revealed mild white matter microvascular ischemia, as well as a developmental venous anomaly in the left posterior frontal operculum. More recent imaging was unavailable for review.   Past Medical History:  Diagnosis Date  . Allergic rhinitis 08/28/2006  . Asthma, chronic 03/14/2010   Does seem to use her albuterol excessively. Consider change to more manageable medicines and treat more like COPD in the future.  . Carpal tunnel syndrome, left 05/29/2009   Last Assessment & Plan:  Advised to wear wrist brace at  night when she is sleeping.  If this does not help will consider having her go to sports medicine.  . Chronic frontal sinusitis 08/28/2016  . Chronic left maxillary sinusitis 08/28/2016  . Diverticulosis   .  Essential hypertension 10/30/2012  . Gastroparesis 11/19/2011   DM and chronic narcotics contribute.  Causes dysphagia like symptoms   . Generalized anxiety disorder 08/28/2006  . GERD (gastroesophageal reflux disease)   . Hearing loss 07/19/2015   Hearing aid in right ear  . History of cataracts, bilateral   . Hyperlipidemia 08/28/2006  . Lumbar radiculopathy 04/07/2018  . Lung nodules   . Osteoporosis 08/08/2011  . Pain in joint, pelvic region and thigh 11/18/2012  . Radial styloid tenosynovitis 08/15/2013  . Restless leg syndrome 05/14/2012  . Seropositive rheumatoid arthritis 01/08/2010   Patient is being followed by rheumatologist at [facility] patient is on Plaquenil and as well as prednisone.   Changed to methotrexate and prednisone in May of 2013  Patient does have a pain contract here with [facility] receives oxycodone 10/325 mg tabs every 6 hours. This has been titrated down from significant doses of methadone previously. Could attempt to tit  . Shoulder joint pain 03/25/2011   The patient is compensating due to her not being able to use her left arm right now rotator cuff impingement if he continues to worsen would not be surprised if she does get a tear on this side as well  . Squamous cell carcinoma in situ of skin 11/25/2014   Referred to dermatology for excision   . Tear of medial meniscus of knee, left 04/27/2009  . Type II diabetes mellitus 08/28/2006   Overview:  Patient has her ups and downs, go in line with flares of her RA and prednisone uses.  Patient does have hx of hypoglycemic events so would not try for perfect control but should have A1c goal around 7.0 Lab Results  Component Value Date   HGBA1C 6.9 12/27/2011   Last Assessment &  Plan:  At goal. Will continue current metformin    Past Surgical History:  Procedure Laterality Date  . ABDOMINAL HYSTERECTOMY    . APPENDECTOMY    . CARPAL TUNNEL RELEASE Right   . CATARACT EXTRACTION Left    with lid lift  . CHOLECYSTECTOMY    . COLONOSCOPY    . HERNIA REPAIR    . KNEE SURGERY Right    x2- cartilage annd scar tissue  . LEFT AND RIGHT HEART CATHETERIZATION WITH CORONARY ANGIOGRAM N/A 08/17/2013   Procedure: LEFT AND RIGHT HEART CATHETERIZATION WITH CORONARY ANGIOGRAM;  Surgeon: Laverda Page, MD;  Location: [facility] C[facility];  Service: Cardiovascular;  Laterality: N/A;  . lumbar back surgery  04/04/2018   done by neurosurgeron  . right foot bone spurs removed    . ROTATOR CUFF REPAIR Right   . SHOULDER SURGERY Left    x2- rotatar cuff tear  . UPPER GASTROINTESTINAL ENDOSCOPY      Current Outpatient Medications:  .  baclofen (LIORESAL) 10 MG tablet, Take 1 tablet (10 mg total) by mouth 3 (three) times daily as needed for muscle spasms. (Patient not taking: Reported on 12/20/2019), Disp: 30 each, Rfl: 5 .  budesonide-formoterol (SYMBICORT) 160-4.5 MCG/ACT inhaler, Inhale 2 puffs into the lungs 2 (two) times daily., Disp: , Rfl:  .  cetirizine (EQ ALLERGY RELIEF, CETIRIZINE,) 10 MG tablet, Take 1 tablet (10 mg total) by mouth at bedtime., Disp: 90 tablet, Rfl: 3 .  chlorhexidine (PERIDEX) 0.12 % solution, Use as directed 15 mLs in the mouth or throat 2 (two) times daily., Disp: 1893 mL, Rfl: 3 .  cyclobenzaprine (FLEXERIL) 10 MG tablet, Take 10 mg by mouth 3 (  three) times daily., Disp: , Rfl:  .  diclofenac sodium (VOLTAREN) 1 % GEL, Apply 2 g topically 4 (four) times daily as needed (for pain)., Disp: , Rfl:  .  donepezil (ARICEPT) 5 MG tablet, Take 5 mg by mouth at bedtime. , Disp: , Rfl:  .  Dulaglutide (TRULICITY) 1.5 DD/2.2GU SOPN, INJECT 1.5 MG INTO THE SKIN ONCE A WEEK, Disp: 4 mL, Rfl: 11 .  ferrous sulfate 325 (65 FE) MG tablet, Take 1 tablet (325 mg total) by  mouth daily with breakfast., Disp: , Rfl:  .  fluticasone (FLONASE) 50 MCG/ACT nasal spray, Place 2 sprays into both nostrils daily as needed for allergies., Disp: 48 g, Rfl: 3 .  gabapentin (NEURONTIN) 300 MG capsule, Take 1 capsule (300 mg total) by mouth at bedtime., Disp: 90 capsule, Rfl: 3 .  glucose blood (ACCU-CHEK AVIVA PLUS) test strip, Use to check blood sugar three times daily or as directed. Dx code: E11.9, Disp: 300 each, Rfl: 3 .  hydrocortisone 1 % lotion, Apply 1 application topically 2 (two) times daily., Disp: 118 mL, Rfl: 0 .  hydrOXYzine (ATARAX/VISTARIL) 25 MG tablet, Take 25-50 mg by mouth at bedtime as needed., Disp: , Rfl:  .  inFLIXimab (REMICADE) 100 MG injection, Inject 100 mg into the vein every 7 (seven) weeks. , Disp: , Rfl:  .  ketorolac (ACULAR) 0.5 % ophthalmic solution, Place 1 drop into the right eye 4 (four) times daily., Disp: , Rfl:  .  leflunomide (ARAVA) 10 MG tablet, Take 10 mg by mouth daily., Disp: , Rfl:  .  losartan (COZAAR) 25 MG tablet, Take 1/2 (one-half) tablet by mouth once daily, Disp: 45 tablet, Rfl: 3 .  metFORMIN (GLUCOPHAGE) 1000 MG tablet, Take 1 tablet (1,000 mg total) by mouth 2 (two) times daily with a meal., Disp: 180 tablet, Rfl: 3 .  mometasone (ELOCON) 0.1 % cream, APPLY CREAM TOPICALLY TO AFFECTED AREA TWICE DAILY. FACE AND ARMS, Disp: , Rfl:  .  montelukast (SINGULAIR) 10 MG tablet, Take by mouth., Disp: , Rfl:  .  nitroGLYCERIN (NITROSTAT) 0.4 MG SL tablet, Place 1 tablet (0.4 mg total) under the tongue every 5 (five) minutes as needed., Disp: 30 tablet, Rfl: 0 .  omeprazole (PRILOSEC) 40 MG capsule, Take 1 capsule (40 mg total) by mouth 2 (two) times daily., Disp: 180 capsule, Rfl: 3 .  oxyCODONE-acetaminophen (PERCOCET) 10-325 MG tablet, Take 1 tablet by mouth every 6 (six) hours as needed for pain., Disp: 120 tablet, Rfl: 0 .  Pramoxine HCl 1 % CREA, Apply 1 Dose topically as directed., Disp: 340 g, Rfl: 1 .  primidone (MYSOLINE)  50 MG tablet, TAKE 2 TABLETS BY MOUTH ONCE DAILY IN THE MORNING AND 1 IN THE EVENING, Disp: 270 tablet, Rfl: 2 .  rosuvastatin (CRESTOR) 10 MG tablet, Take 1 tablet (10 mg total) by mouth daily., Disp: 90 tablet, Rfl: 3 .  sertraline (ZOLOFT) 50 MG tablet, Take 1 tablet (50 mg total) by mouth daily., Disp: 90 tablet, Rfl: 3 .  VENTOLIN HFA 108 (90 Base) MCG/ACT inhaler, INHALE 2 PUFFS BY MOUTH EVERY 4 HOURS AS NEEDED FOR WHEEZING OR SHORTNESS OF BREATH, Disp: 108 g, Rfl: 11  Clinical Interview:   Cognitive Symptoms: Decreased short-term memory: Endorsed. She reported repeating herself often, trouble recalling the details of previous conversations, and trouble remembering upcoming responsibilities without substantial reminders. She also reported recently forgetting the date associated with one of her daughter's birthday. She reported wondering if these deficits  could be medication side-effects. Deficits were said to be noticeable for the past 6-12 months and have seemed largely stable over that time.  Decreased long-term memory: Denied. Decreased attention/concentration: Largely denied. However, she did wonder if she has been more easily distracted lately.  Reduced processing speed: Endorsed ("might be").  Difficulties with executive functions: Denied. She also denied trouble with impulsivity or overt personality changes.  Difficulties with emotion regulation: Denied. Difficulties with receptive language: Denied. Difficulties with word finding: Endorsed. She further noted some difficulties with word pronunciation.  Decreased visuoperceptual ability: Denied.  Difficulties completing ADLs: Denied.  Additional Medical History: History of traumatic brain injury/concussion: Denied. History of stroke: Denied. History of seizure activity: Denied. History of known exposure to toxins: Denied. Symptoms of chronic pain: Endorsed. Diffuse body pain was primarily attributed to ongoing symptoms of  rheumatoid arthritis. She described pain levels as functionally limiting to the point where she cannot stand for long periods of time. She is followed by [facility] for ongoing treatment/management.  Experience of frequent headaches/migraines: Denied. Frequent instances of dizziness/vertigo: Denied.  Sensory changes: She reported having cataracts removed bilaterally and currently uses readers with positive effect. She also wears a hearing aid in her right ear. Other sensory changes/difficulties (e.g., taste or smell) were denied.  Balance/coordination difficulties: Endorsed. Balance difficulties were attributed to chronic pain and weakness stemming from rheumatoid arthritis. She reported a history of a few falls with the most recent occurring this past January where she slipped on some wet leaves and fell down a few stairs on her deck injuring her back.  Other motor difficulties: Endorsed. She has a history of essential tremor. Symptoms were said to predominantly affect the left hand, but also affect the right hand to a mild extent. Symptoms were said to have been present for several years. Medications do have a positive effect on these symptoms; however, they do not eliminate symptoms as mild tremors do persist.  Sleep History: Estimated hours obtained each night: 5 hours. She reported that she has not been sleeping well lately. This was partially attributed to her waking throughout the night to help her small dog onto the bed after he has previously jumped off.  Difficulties falling asleep: Denied. Difficulties staying asleep: Denied outside what is described above. She denied pronounced difficulties falling back asleep after being awoken.  Feels rested and refreshed upon awakening: Variably so. However, she does experience increasing levels of fatigue throughout the later morning/afternoon.   History of snoring: Endorsed. History of waking up gasping for air: Denied. Witnessed breath cessation while  asleep: Denied.  History of vivid dreaming: Denied. Excessive movement while asleep: Endorsed. She reported frequent tossing and turning behaviors attributed to attempts in getting comfortable given ongoing pain symptoms. She denied these behaviors arising to where she will accidentally hit or kick her husband.  Instances of acting out her dreams: Denied.  Psychiatric/Behavioral Health History: Depression: Denied. She described her current mood as "fine" and denied to her knowledge ever being diagnosed with depression in the past. Current or remote suicidal ideation, intent, or plan was denied.  Anxiety: Endorsed. She reported a previous history of becoming very anxious surrounding her children's safety whenever she would hear a siren or ambulance. She was prescribed sertraline for this; she described this as being helpful and that she does not notice these increases in anxiety as much.  Mania: Denied. Trauma History: Denied. Visual/auditory hallucinations: Denied. Delusional thoughts: Denied.  Tobacco: Denied. She quit approximately 26 years prior.  Alcohol: She  denied current alcohol consumption as well as a history of problematic alcohol abuse or dependence.  Recreational drugs: Denied. Caffeine: Two large cups of coffee in the morning.   Family History: Problem Relation Age of Onset  . Stroke Father 4  . Colon cancer Maternal Aunt   . Tremor Mother   . Lung cancer Brother 24  . Rheum arthritis Sister 77  . Colon cancer Daughter   . COPD Sister   . Cancer Sister        in nose  . Diabetes Daughter   . Hypertension Daughter   . Healthy Daughter   . Bell's palsy Maternal Grandmother   . Esophageal cancer Neg Hx   . Rectal cancer Neg Hx   . Stomach cancer Neg Hx    This information was confirmed by Ms. Garrison.  Academic/Vocational History: Highest level of educational attainment: 9 years. She completed the 9th grade. She left school after getting married when she was 69  years old. She described herself as an average (B/C) student in academic settings. Math represented a potential relative weakness.  History of developmental delay: Denied. History of grade repetition: Denied. Enrollment in special education courses: Denied. History of LD/ADHD: Denied.  Employment: Retired. 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   Evaluation Results:   Behavioral Observations: Ms. Meyerhoff was unaccompanied, arrived to her appointment on time, and was appropriately dressed and groomed. She appeared alert and oriented. Observed gait and station were within normal limits. Mild tremulous behaviors were observed in her upper extremities, left worse than right. Her affect was generally relaxed and positive, but did range appropriately given the subject being discussed during the clinical interview or the task at hand during testing procedures. Spontaneous speech was fluent and word finding difficulties were not observed during the clinical interview or testing procedures. Thought processes were coherent, organized, and normal in content. Insight into her cognitive difficulties appeared adequate. During testing, sustained attention was appropriate. Task engagement was adequate and she persisted when challenged. Overall, Ms. Andreasen was cooperative with the clinical interview and subsequent testing procedures.   Adequacy of Effort: The validity of neuropsychological testing is limited by the extent to which the individual being tested may be assumed to have exerted adequate effort during testing. Ms. Roane expressed her intention to perform to the best of her abilities and exhibited adequate task engagement and persistence. Scores across stand-alone and embedded performance validity measures were within expectation. As such, the results of the current evaluation are believed to be a valid representation of Ms. Roggenkamp's current cognitive functioning.  Test Results: Ms. Schnackenberg  was fully oriented at the time of the current evaluation.  Intellectual abilities based upon educational and vocational attainment were estimated to be in the below average to average range. Premorbid abilities were estimated to be within the below average range based upon a single-word reading test.   Processing speed was average. Basic attention was average. More complex attention (e.g., working memory) was above average. Executive functioning was largely below average to average. Performance was variable on a task assessing problem solving and adaptability, with scores ranging from the well below average to average normative ranges.  Assessed receptive language abilities were above average. Likewise, Ms. Kargbo did not exhibit any difficulties comprehending task instructions and answered all questions asked of her appropriately. Assessed expressive language (e.g., verbal fluency and confrontation naming) was average to above average.     Assessed visuospatial/visuoconstructional abilities were average to well above average.  Learning (i.e., encoding) of novel verbal and visual information was average. Spontaneous delayed recall (i.e., retrieval) of previously learned information was commensurate with performance across initial learning trials average to well above average. Retention rates were 81% across a story learning task, 100% across a list learning task, and 67% across a shape learning task. Performance across recognition tasks was largely average, suggesting evidence for information consolidation. A relative weakness was exhibited recognizing a previously learned list of words, partially attributed to difficulty distinguishing between words from lists A and B.   Results of emotional screening instruments suggested that recent symptoms of generalized anxiety were in the mild range, while symptoms of depression were within normal limits. A screening instrument assessing recent sleep quality  suggested the presence of minimal sleep dysfunction. Responses across a Parkinson's disease symptom scale (rated using terminology surrounding essential tremor rather than Parkinson's disease) suggested primary areas of difficulty surrounding bodily discomfort and mobility.   Tables of Scores:   Note: This summary of test scores accompanies the interpretive report and should not be considered in isolation without reference to the appropriate sections in the text. Descriptors are based on appropriate normative data and may be adjusted based on clinical judgment. The terms "impaired" and "within normal limits (WNL)" are used when a more specific level of functioning cannot be determined.       Effort Testing:   DESCRIPTOR       ACS Word Choice: --- --- Within Expectation  NAB EVI: --- --- Within Expectation  D-KEFS Color Word Effort Index: --- --- Within Expectation       Orientation:      Raw Score Percentile   NAB Orientation, Form 1 29/29 --- ---       Intellectual Functioning:           Standard Score Percentile   Test of Premorbid Functioning: 83 13 Below Average       Memory:          NAB Memory Module, Form 2: Standard Score/ T Score Percentile   Total Memory Index 100 50 Average  List Learning       Total Trials 1-3 18/36 (45) 31 Average    List B 4/12 (53) 62 Average    Short Delay Free Recall 8/12 (53) 62 Average    Long Delay Free Recall 8/12 (54) 66 Average    Retention Percentage 100 (51) 54 Average    Recognition Discriminability 4 (35) 7 Well Below Average  Shape Learning       Total Trials 1-3 11/27 (43) 25 Average    Delayed Recall 4/9 (46) 34 Average    Retention Percentage 67 (40) 16 Below Average    Recognition Discriminability 6 (46) 34 Average  Story Learning       Immediate Recall 49/80 (54) 66 Average    Delayed Recall 22/40 (50) 50 Average    Retention Percentage 81 (46) 34 Average  Daily Living Memory       Immediate Recall 39/51 (47) 38 Average     Delayed Recall 17/17 (63) 91 Well Above Average    Retention Percentage 106 (65) 93 Well Above Average    Recognition Hits 8/10 (44) 27 Average       Attention/Executive Function:          Trail Making Test (TMT): Raw Score (T Score) Percentile     Part A 31 secs.,  0 errors (56) 73 Average    Part B 79 secs.,  3 errors (  55) 69 Average        Symbol Digit Modalities Test (SDMT): Raw Score (Z-Score) Percentile     Oral 49 (0.61) 73 Average       NAB Attention Module, Form 1: T Score Percentile     Digits Forward 52 58 Average    Digits Backwards 62 88 Above Average       D-KEFS Color-Word Interference Test: Raw Score (Scaled Score) Percentile     Color Naming 30 secs. (11) 63 Average    Word Reading 22 secs. (11) 63 Average    Inhibition 87 secs. (7) 16 Below Average      Total Errors 4 errors (8) 25 Average    Inhibition/Switching 87 secs. (9) 37 Average      Total Errors 2 errors (11) 63 Average       D-KEFS 20 Questions Test: Scaled Score Percentile     Total Weighted Achievement Score 10 50 Average    Initial Abstraction Score 10 50 Average       [place] Card Sorting Test: Raw Score Percentile     Categories (trials) 2 (64) 11-16 Below Average    Total Errors 35 7 Well Below Average    Perseverative Errors 16 27 Average    Non-Perseverative Errors 19 5 Well Below Average    Failure to Maintain Set 0 --- ---       Language:          Verbal Fluency Test: Raw Score (T Score) Percentile     Phonemic Fluency (FAS) 29 (44) 27 Average    Animal Fluency 17 (50) 50 Average        NAB Language Module, Form 2: T Score Percentile     Auditory Comprehension 59 82 Above Average    Naming 30/31 (57) 75 Above Average       Visuospatial/Visuoconstruction:      Raw Score Percentile   Clock Drawing: 7/10 --- Within Normal Limits       NAB Spatial Module, Form 2: T Score Percentile     Visual Discrimination 63 91 Well Above Average    Design Construction 47 38 Average          Scaled Score Percentile   WAIS-IV Matrix Reasoning: 6 9 Below Average       Mood and Personality:      Raw Score Percentile   Geriatric Depression Scale: 8 --- Within Normal Limits  Geriatric Anxiety Scale: 12 --- Mild    Somatic 8 --- Mild    Cognitive 2 --- Minimal    Affective 2 --- Minimal       Additional Questionnaires:      Raw Score Percentile   PROMIS Sleep Disturbance Questionnaire: 19 --- None to Slight       Parkinson's Disease Questionnaire-39: Raw Score Percentile     Mobility 14 35 Moderately Affected    Activities of Daily Living 4 17 Mildly Affected    Emotional Well-Being 4 17 Mildly Affected    Stigma 1 6 Minimally Affected    Social Support 3 25 Mildly Affected    Cognitions 5 31 Moderately Affected    Communication 1 8 Minimally Affected    Bodily Discomfort 7 58 Moderately Affected   Informed Consent and Coding/Compliance:   Ms. Utecht was provided with a verbal description of the nature and purpose of the present neuropsychological evaluation. Also reviewed were the foreseeable risks and/or discomforts and benefits of the procedure, limits of confidentiality, and mandatory reporting  requirements of this provider. The patient was given the opportunity to ask questions and receive answers about the evaluation. Oral consent to participate was provided by the patient.   This evaluation was conducted by Christia Reading, Ph.D., licensed clinical neuropsychologist. Ms. Coscia completed a comprehensive clinical interview with Dr. Melvyn Novas, billed as one unit [phone], and 150 minutes of cognitive testing and scoring, billed as one unit [phone] and four additional units [id number]. Psychometrist Cruzita Lederer, B.S., assisted Dr. Melvyn Novas with test administration and scoring procedures. As a separate and discrete service, Dr. Melvyn Novas spent a total of 120 minutes in interpretation and report writing billed as one unit [phone] and one unit [phone].

## 2020-01-26 NOTE — Patient Instructions (Signed)
Clinical Impression(s): Overall, Michelle [name]'s pattern of performance is suggestive of neuropsychological functioning largely within appropriate normative ranges. Isolated weaknesses were seen across recognition discriminability of a list learning test, as well as certain aspects of a complex task assessing problem solving and adaptability. Regarding the former, performance across other recognition discriminability measures were strong, as was performance across memory tasks as a whole. Regarding the latter, performances across other aspects of this task were appropriate, as was all other assessments of executive functioning. As such, neither of these relative weaknesses is cause for concern. Overall, I do not feel that these weaknesses arise to where a diagnosis of a mild neurocognitive disorder is warranted at the present time and Michelle [name] denied difficulties completing instrumental activities of daily living (ADLs) independently.  Across mood-related questionnaires, she endorsed mild symptoms of anxiety while symptoms of depression were within normal limits. Factors which can create and maintain cognitive inefficiencies include psychiatric distress (e.g., anxiety and depression), various psychosocial stressors, chronic pain, and medication side effects. It is likely that a combination of these factors are responsible for day-to-day cognitive difficulties which Michelle [name] has been experiencing presently. Specific to memory, Michelle [name] was able to learn novel verbal and visual information efficiently and retain this knowledge after lengthy delays. Overall, memory performance combined with intact performances across other areas of cognitive functioning (e.g., confrontation naming, semantic fluency, visuoperceptual abilities) is not suggestive of an underlying neurodegenerative condition affecting memory processes (e.g., Alzheimer's disease). Likewise, her current cognitive and behavioral profile is not  suggestive of any other neurodegenerative illnesses such as Lewy body or frontotemporal dementia. Continued medical monitoring will be important moving forward.

## 2020-01-26 NOTE — Progress Notes (Signed)
   Psychometrician Note   Cognitive testing was administered to Letta Kocher by Cruzita Lederer, B.S. (psychometrist) under the supervision of Dr. Christia Reading, Ph.D., licensed psychologist on 01/26/2020. Ms. Torain did not appear overtly distressed by the testing session per behavioral observation or responses across self-report questionnaires. Dr. Christia Reading, Ph.D. checked in with Ms. Lunsford as needed to manage any distress related to testing procedures (if applicable). Rest breaks were offered.    The battery of tests administered was selected by Dr. Christia Reading, Ph.D. with consideration to Ms. Tenorio's current level of functioning, the nature of her symptoms, emotional and behavioral responses during interview, level of literacy, observed level of motivation/effort, and the nature of the referral question. This battery was communicated to the psychometrist. Communication between Dr. Christia Reading, Ph.D. and the psychometrist was ongoing throughout the evaluation and Dr. Christia Reading, Ph.D. was immediately accessible at all times. Dr. Christia Reading, Ph.D. provided supervision to the psychometrist on the date of this service to the extent necessary to assure the quality of all services provided.    MEGGEN SPAZIANI will return within approximately 1-2 weeks for an interactive feedback session with Dr. Melvyn Novas at which time her test performances, clinical impressions, and treatment recommendations will be reviewed in detail. Ms. Creed understands she can contact our office should she require our assistance before this time.  A total of 150 minutes of billable time were spent face-to-face with Ms. Mctague by the psychometrist. This includes both test administration and scoring time. Billing for these services is reflected in the clinical report generated by Dr. Christia Reading, Ph.D..  This note reflects time spent with the psychometrician and does not include test scores or any  clinical interpretations made by Dr. Melvyn Novas. The full report will follow in a separate note.

## 2020-01-27 DIAGNOSIS — M25562 Pain in left knee: Secondary | ICD-10-CM | POA: Diagnosis not present

## 2020-01-27 DIAGNOSIS — Z5181 Encounter for therapeutic drug level monitoring: Secondary | ICD-10-CM | POA: Diagnosis not present

## 2020-01-27 DIAGNOSIS — G8929 Other chronic pain: Secondary | ICD-10-CM | POA: Diagnosis not present

## 2020-01-27 DIAGNOSIS — M153 Secondary multiple arthritis: Secondary | ICD-10-CM | POA: Diagnosis not present

## 2020-01-27 DIAGNOSIS — M25552 Pain in left hip: Secondary | ICD-10-CM | POA: Diagnosis not present

## 2020-01-27 DIAGNOSIS — Z79899 Other long term (current) drug therapy: Secondary | ICD-10-CM | POA: Diagnosis not present

## 2020-01-27 DIAGNOSIS — M059 Rheumatoid arthritis with rheumatoid factor, unspecified: Secondary | ICD-10-CM | POA: Diagnosis not present

## 2020-02-08 ENCOUNTER — Ambulatory Visit (INDEPENDENT_AMBULATORY_CARE_PROVIDER_SITE_OTHER): Payer: Medicare Other | Admitting: Psychology

## 2020-02-08 ENCOUNTER — Other Ambulatory Visit: Payer: Self-pay

## 2020-02-08 DIAGNOSIS — R251 Tremor, unspecified: Secondary | ICD-10-CM

## 2020-02-08 DIAGNOSIS — F411 Generalized anxiety disorder: Secondary | ICD-10-CM

## 2020-02-08 NOTE — Patient Instructions (Signed)
Recommendations: A repeat neuropsychological evaluation in 24 months (or sooner if functional decline is noted) is recommended to assess the trajectory of future cognitive decline should it occur. This will also aid in future efforts towards improved diagnostic clarity.  If ongoing sleep disruptions are brought on solely by her needing to get up in the middle of the night to place her dog back on the bed after he/she has jumped off as were described, Ms. Demond could consider purchasing a small ladder to put at the base of the bed from a local pet store. This would allow her dog to come and go and his/her leisure and not wake her up in the middle of the night.   A combination of medication and psychotherapy has been shown to be most effective at treating symptoms of anxiety and depression. Should she feel that symptoms of anxiety are not optimally managed, she would be encouraged to speak with her prescribing physician regarding medication adjustments to optimally manage these symptoms. Likewise, Ms. Ting could also consider engaging in short-term psychotherapy to address symptoms of psychiatric distress. She would benefit from an active and collaborative therapeutic environment, rather than one purely supportive in nature. Recommended treatment modalities include Cognitive Behavioral Therapy (CBT) or Acceptance and Commitment Therapy (ACT).  Ms. Fantini is encouraged to attend to lifestyle factors for brain health (e.g., regular physical exercise, good nutrition habits, regular participation in cognitively-stimulating activities, and general stress management techniques), which are likely to have benefits for both emotional adjustment and cognition. In fact, in addition to promoting good general health, regular exercise incorporating aerobic activities (e.g., brisk walking, jogging, cycling, etc.) has been demonstrated to be a very effective treatment for depression and stress, with similar efficacy  rates to both antidepressant medication and psychotherapy. Optimal control of vascular risk factors (including safe cardiovascular exercise and adherence to dietary recommendations) is encouraged.   If interested, there are some activities which have therapeutic value and can be useful in keeping her cognitively stimulated. For suggestions, Ms. Grondin is encouraged to go to the following website: https://www.[facility].org/get-to-know-barrow/centers-programs/neurorehabilitation-center/neuro-rehab-apps-and-games/ which has options, categorized by level of difficulty. It should be noted that these activities should not be viewed as a substitute for therapy.  When learning new information, she would benefit from information being broken up into small, manageable pieces. She may also find it helpful to articulate the material in her own words and in a context to promote encoding at the onset of a new task. This material may need to be repeated multiple times to promote encoding.  Memory can be improved using internal strategies such as rehearsal, repetition, chunking, mnemonics, association, and imagery. External strategies such as written notes in a consistently used memory journal, visual and nonverbal auditory cues such as a calendar on the refrigerator or appointments with alarm, such as on a cell phone, can also help maximize recall.    To address problems with attention or executive dysfunction, she may wish to consider:   -Avoiding external distractions when needing to concentrate   -Limiting exposure to fast paced environments with multiple sensory demands   -Writing down complicated information and using checklists   -Attempting and completing one task at a time (i.e., no multi-tasking)   -Verbalizing aloud each step of a task to maintain focus   -Reducing the amount of information considered at one time

## 2020-02-08 NOTE — Progress Notes (Signed)
   Neuropsychology Feedback Session Michelle [name]. [facility]  Reason for Referral:   Michelle [name] a 69 y.o. right-handed Caucasian female referred by Alonza Bogus, D.O.,to characterize hercurrent cognitive functioning and assist with diagnostic clarity and treatment planning in the context of subjective cognitive decline and history of essential tremor.  Feedback:   Michelle [name] completed a comprehensive neuropsychological evaluation on 01/26/2020. Please refer to that encounter for the full report and recommendations. Briefly, results suggested neuropsychological functioning largely within appropriate normative ranges. Isolated weaknesses were seen across recognition discriminability of a list learning test, as well as certain aspects of a complex task assessing problem solving and adaptability. Across mood-related questionnaires, she endorsed mild symptoms of anxiety while symptoms of depression were within normal limits. Factors which can create and maintain cognitive inefficiencies include psychiatric distress (e.g., anxiety and depression), various psychosocial stressors, chronic pain, and medication side effects. It is likely that a combination of these factors are responsible for day-to-day cognitive difficulties which Michelle [name] has been experiencing presently.   Michelle [name] was unaccompanied during the current telephone call. She was within her residence while I was within my office. Content of the current session focused on the results of her neuropsychological evaluation. Michelle [name] was given the opportunity to ask questions and her questions were answered. She was encouraged to reach out should additional questions arise. A copy of her report was mailed following its completion.     Less than 16 minutes were spent conducting the current feedback session with Michelle [name].

## 2020-02-15 ENCOUNTER — Encounter: Payer: Self-pay | Admitting: Podiatry

## 2020-02-15 ENCOUNTER — Other Ambulatory Visit: Payer: Self-pay

## 2020-02-15 ENCOUNTER — Ambulatory Visit (INDEPENDENT_AMBULATORY_CARE_PROVIDER_SITE_OTHER): Payer: Medicare Other | Admitting: Podiatry

## 2020-02-15 DIAGNOSIS — M79674 Pain in right toe(s): Secondary | ICD-10-CM | POA: Diagnosis not present

## 2020-02-15 DIAGNOSIS — L84 Corns and callosities: Secondary | ICD-10-CM

## 2020-02-15 DIAGNOSIS — E119 Type 2 diabetes mellitus without complications: Secondary | ICD-10-CM | POA: Diagnosis not present

## 2020-02-15 DIAGNOSIS — M79675 Pain in left toe(s): Secondary | ICD-10-CM | POA: Diagnosis not present

## 2020-02-15 DIAGNOSIS — B351 Tinea unguium: Secondary | ICD-10-CM | POA: Diagnosis not present

## 2020-02-16 ENCOUNTER — Encounter: Payer: Medicare Other | Admitting: Dermatology

## 2020-02-17 NOTE — Progress Notes (Signed)
Subjective: Michelle [name] presents today for follow up of preventative diabetic foot care and painful corn(s) left 3rd digit and painful thick toenails that are difficult to trim. Painful toenails interfere with ambulation. Aggravating factors include wearing enclosed shoe gear. Pain is relieved with periodic professional debridement. Painful corns are aggravated when weightbearing when wearing enclosed shoe gear. Pain is relieved with periodic professional debridement.   Allergies  Allergen Reactions  . Zofran [Ondansetron Hcl] Other (See Comments)    Prolong QT- pt unsure of this allergy  . Ace Inhibitors Cough    Occurred with liinopril  . Levofloxacin Other (See Comments)    Felt things on her legs that were not there,made her stomach hurt  . Lisinopril Cough  . Sulfonamide Derivatives Hives and Itching     Objective: There were no vitals filed for this visit.  Pt is a pleasant 69 y.o. year old Caucasian female WD, WN in NAD. AAO x 3.   Vascular Examination:  Capillary refill time to digits immediate b/l. Palpable DP pulses b/l. Palpable PT pulses b/l. Pedal hair sparse b/l. Skin temperature gradient within normal limits b/l. No edema noted b/l.  Dermatological Examination: Pedal skin with normal turgor, texture and tone bilaterally. No open wounds bilaterally. No interdigital macerations bilaterally. Toenails 1-5 b/l elongated, discolored, dystrophic, thickened, crumbly with subungual debris and tenderness to dorsal palpation. Hyperkeratotic lesion(s) L 3rd toe.  No erythema, no edema, no drainage, no flocculence.    Musculoskeletal: Normal muscle strength 5/5 to all lower extremity muscle groups bilaterally, no gross bony deformities bilaterally and no pain crepitus or joint limitation noted with ROM b/l.  Neurological: Protective sensation intact 5/5 intact bilaterally with 10g monofilament b/l. Vibratory sensation intact b/l. Proprioception intact bilaterally. Babinski reflex  negative b/l. Clonus negative b/l.  Assessment: 1. Pain due to onychomycosis of toenails of both feet   2. Corns   3. Diabetes mellitus without complication ([place])    Plan: -Examined patient. -Continue diabetic foot care principles. -Toenails 1-5 b/l were debrided in length and girth with sterile nail nippers and dremel without iatrogenic bleeding.  -Corn(s) L 3rd toe pared utilizing sterile scalpel blade without complication or incident. Total number debrided=1. -Patient to report any pedal injuries to medical professional immediately. -Patient to continue soft, supportive shoe gear daily. -Patient/POA to call should there be question/concern in the interim.  Return in about 3 months (around 05/17/2020) for nail trim.

## 2020-02-25 ENCOUNTER — Other Ambulatory Visit: Payer: Self-pay | Admitting: *Deleted

## 2020-02-25 MED ORDER — ACCU-CHEK AVIVA PLUS VI STRP: ORAL_STRIP | 3 refills | Status: DC

## 2020-03-02 ENCOUNTER — Encounter: Payer: Self-pay | Admitting: Family Medicine

## 2020-03-02 ENCOUNTER — Ambulatory Visit (INDEPENDENT_AMBULATORY_CARE_PROVIDER_SITE_OTHER): Payer: Medicare Other | Admitting: Family Medicine

## 2020-03-02 ENCOUNTER — Other Ambulatory Visit: Payer: Self-pay

## 2020-03-02 VITALS — BP 130/66 | HR 93 | Ht 63.0 in | Wt 145.0 lb

## 2020-03-02 DIAGNOSIS — E1142 Type 2 diabetes mellitus with diabetic polyneuropathy: Secondary | ICD-10-CM

## 2020-03-02 DIAGNOSIS — Z23 Encounter for immunization: Secondary | ICD-10-CM

## 2020-03-02 DIAGNOSIS — E119 Type 2 diabetes mellitus without complications: Secondary | ICD-10-CM | POA: Diagnosis not present

## 2020-03-02 DIAGNOSIS — R159 Full incontinence of feces: Secondary | ICD-10-CM

## 2020-03-02 DIAGNOSIS — M059 Rheumatoid arthritis with rheumatoid factor, unspecified: Secondary | ICD-10-CM

## 2020-03-02 DIAGNOSIS — R32 Unspecified urinary incontinence: Secondary | ICD-10-CM

## 2020-03-02 LAB — POCT GLYCOSYLATED HEMOGLOBIN (HGB A1C): HbA1c, POC (controlled diabetic range): 6.9 % (ref 0.0–7.0)

## 2020-03-02 NOTE — Patient Instructions (Addendum)
It was nice to see you today,   We will get an MRI on your back due to your incontinence.  If that is normal I think you should schedule an appointment with your gastroenterologist.    I will call you with the results when I get them.  We will decide on a follow up date after your MRI results come back.    Have a great day,   Clemetine Marker, MD

## 2020-03-02 NOTE — Progress Notes (Signed)
° ° °  SUBJECTIVE:   CHIEF COMPLAINT / HPI:   Diabetes: Patient states she has been having a lot of birthday parties for her grandchildren.  She is worried her blood sugar might be higher this time.  She also needs a new glucometer.  She has seen the ophthalmologist, Dr. Leanne Lovely.  Rheumatoid arthritis: Pain remains about the same.  She has an infusion next week.    Bowel incontinence: Patient has been having bowel incontinence in which she cannot get to the bathroom soon enough when she feels the urge.  This has been going on for few months.  She has occasional abdominal pain.  No vomiting.  Bowel movements are not diarrhea.  She sometimes gets a stinging or burning sensation in her groin a few times a week that last 2 to 3 minutes.  No numbness.  No difficulty walking or leg weakness.  She thinks she may have exocrine pancreas insufficiency after seeing it on TV and researching it.  Her urinary incontinence is actually improved with Myrbetriq.  Covid vaccination: Patient received the Pfizer vaccine, second dose on 3/10.  Wants to know if she is eligible to get the booster vaccine due to her immunocompromise state.  She would also like to get the flu vaccine if possible.   PERTINENT  PMH / PSH: Diabetes, rheumatoid arthritis, urinary incontinence  OBJECTIVE:   BP 130/66    Pulse 93    Ht 5\' 3"  (1.6 m)    Wt 145 lb (65.8 kg)    SpO2 93%    BMI 25.69 kg/m   General: Alert, oriented.  No acute distress. CV: Regular rate and rhythm, no murmurs Pulmonary: Lungs clear auscultation bilaterally GI: Patient has decreased rectal tone  ASSESSMENT/PLAN:   Type II diabetes mellitus A1c improved today.  Will put in order for new glucometer and send in records request form to her ophthalmologist.  Bowel and bladder incontinence Patient with a history of bladder incontinence that is somewhat improving with Myrbetriq, but since our last visit she endorses frequent bowel incontinence.  No she needs to go  to the bathroom but is unable to make it to the bathroom in time.  Given this finding with her groin paresthesia and decreased rectal tone we will get a stat MRI of the lumbar spine.  If normal will send to gastroenterology for evaluation for EPI or other causes of bowel incontinence.  Seropositive rheumatoid arthritis Still stable at her regular dose of narcotics.  We will continue for another 3 months.  Patient to get infusion next week.  Encounter for immunization Due to patient's immunocompromise status from her retorted arthritis treatment she is eligible for a booster shot of the Covid vaccine.  We will administer that to her today.  Patient also desiring flu shot.  We will administer that as well.     [name], MD [facility]

## 2020-03-05 ENCOUNTER — Ambulatory Visit
Admission: RE | Admit: 2020-03-05 | Discharge: 2020-03-05 | Disposition: A | Discharge status: Home / Self Care | Payer: Medicare Other | Source: Ambulatory Visit | Attending: Family Medicine | Admitting: Family Medicine

## 2020-03-05 ENCOUNTER — Other Ambulatory Visit: Payer: Self-pay

## 2020-03-05 DIAGNOSIS — M5126 Other intervertebral disc displacement, lumbar region: Secondary | ICD-10-CM | POA: Diagnosis not present

## 2020-03-05 DIAGNOSIS — R32 Unspecified urinary incontinence: Secondary | ICD-10-CM

## 2020-03-05 DIAGNOSIS — Z23 Encounter for immunization: Secondary | ICD-10-CM | POA: Insufficient documentation

## 2020-03-05 DIAGNOSIS — R159 Full incontinence of feces: Secondary | ICD-10-CM | POA: Insufficient documentation

## 2020-03-05 MED ORDER — GADOBENATE DIMEGLUMINE 529 MG/ML IV SOLN
13.0000 mL | Freq: Once | INTRAVENOUS | Status: AC | PRN
  Administered 2020-03-05: 13 mL via INTRAVENOUS

## 2020-03-05 NOTE — Assessment & Plan Note (Signed)
A1c improved today.  Will put in order for new glucometer and send in records request form to her ophthalmologist.

## 2020-03-05 NOTE — Assessment & Plan Note (Signed)
Due to patient's immunocompromise status from her retorted arthritis treatment she is eligible for a booster shot of the Covid vaccine.  We will administer that to her today.  Patient also desiring flu shot.  We will administer that as well.

## 2020-03-05 NOTE — Assessment & Plan Note (Signed)
Still stable at her regular dose of narcotics.  We will continue for another 3 months.  Patient to get infusion next week.

## 2020-03-05 NOTE — Assessment & Plan Note (Signed)
Patient with a history of bladder incontinence that is somewhat improving with Myrbetriq, but since our last visit she endorses frequent bowel incontinence.  No she needs to go to the bathroom but is unable to make it to the bathroom in time.  Given this finding with her groin paresthesia and decreased rectal tone we will get a stat MRI of the lumbar spine.  If normal will send to gastroenterology for evaluation for EPI or other causes of bowel incontinence.

## 2020-03-08 DIAGNOSIS — M059 Rheumatoid arthritis with rheumatoid factor, unspecified: Secondary | ICD-10-CM | POA: Diagnosis not present

## 2020-03-09 ENCOUNTER — Telehealth: Payer: Self-pay

## 2020-03-09 NOTE — Telephone Encounter (Signed)
Patient calling nurse line requesting returned phone call from provider to discuss MRI results from 03/05/20.   To PCP  Talbot Grumbling, RN

## 2020-03-10 ENCOUNTER — Other Ambulatory Visit: Payer: Self-pay | Admitting: Family Medicine

## 2020-03-10 MED ORDER — OXYCODONE-ACETAMINOPHEN 10-325 MG PO TABS
1.0000 | ORAL_TABLET | ORAL | 0 refills | Status: DC | PRN
Start: 2020-05-18 — End: 2020-04-03

## 2020-03-10 MED ORDER — ONETOUCH VERIO W/DEVICE KIT: PACK | 0 refills | Status: DC

## 2020-03-10 MED ORDER — ONETOUCH VERIO VI STRP: ORAL_STRIP | 12 refills | Status: DC

## 2020-03-10 MED ORDER — OXYCODONE-ACETAMINOPHEN 10-325 MG PO TABS: 1.0000 | ORAL_TABLET | ORAL | 0 refills | Status: DC | PRN

## 2020-03-10 MED ORDER — OXYCODONE-ACETAMINOPHEN 10-325 MG PO TABS: 1.0000 | ORAL_TABLET | Freq: Four times a day (QID) | ORAL | 0 refills | Status: DC | PRN

## 2020-03-10 MED ORDER — INCONTINENCE BRIEF MEDIUM MISC: 5 refills | Status: DC

## 2020-03-10 NOTE — Telephone Encounter (Signed)
Returned pt's call and discussed results with her.  Pt will call her neurosurgeon to discuss treatment options.

## 2020-03-13 ENCOUNTER — Telehealth: Payer: Self-pay | Admitting: *Deleted

## 2020-03-13 DIAGNOSIS — E119 Type 2 diabetes mellitus without complications: Secondary | ICD-10-CM

## 2020-03-13 NOTE — Telephone Encounter (Signed)
Received fax stating that for the pts refill on One Touch verio strip they need the Rx sent with exact testing frequency and ICD-10 code for Medicare Part B billing. Marsh Heckler Zimmerman Rumple, CMA

## 2020-03-14 MED ORDER — ONETOUCH VERIO VI STRP: ORAL_STRIP | 12 refills | Status: DC

## 2020-03-14 MED ORDER — ONETOUCH VERIO VI STRP: 1.0000 | ORAL_STRIP | Freq: Every day | 12 refills | Status: DC

## 2020-03-14 NOTE — Telephone Encounter (Signed)
done

## 2020-03-14 NOTE — Telephone Encounter (Signed)
Patient calls nurse line stating the prescription still needs additional information. Patient reports she tests 3 times daily. I have updated prescription and resent to [facility].

## 2020-03-14 NOTE — Addendum Note (Signed)
Addended by: Dorna Bloom on: 03/14/2020 03:25 PM   Modules accepted: Orders

## 2020-03-15 ENCOUNTER — Telehealth: Payer: Self-pay | Admitting: Family Medicine

## 2020-03-15 NOTE — Telephone Encounter (Signed)
Insurance will not pay for testing three times a day in a patient not taking insulin, hence the prescription for once a day.  They've previously sent me faxes asking for documentation of why she needs testing three times a day and I cannot provide it for them, so I changed it to once a day.

## 2020-03-16 NOTE — Telephone Encounter (Signed)
Received 2 more faxes on this persons Rx.  I am placing them in your box so you can fix them and see exactly what they have.  Looks like the kit needs the ICD-10 code on it for Medicare Part B billing and the strips needs frequency and ICD-10 code for billing.  Looks like the last Rx for strips was sent for TID and your note said you changed it to once. Emagene Merfeld Zimmerman Rumple, CMA

## 2020-03-17 ENCOUNTER — Other Ambulatory Visit: Payer: Self-pay | Admitting: Family Medicine

## 2020-03-17 DIAGNOSIS — E119 Type 2 diabetes mellitus without complications: Secondary | ICD-10-CM

## 2020-03-17 MED ORDER — ONETOUCH VERIO VI STRP: ORAL_STRIP | 12 refills | Status: DC

## 2020-03-20 NOTE — Telephone Encounter (Signed)
I re-sent it with all the information they requested.

## 2020-03-29 DIAGNOSIS — Z6825 Body mass index (BMI) 25.0-25.9, adult: Secondary | ICD-10-CM | POA: Diagnosis not present

## 2020-03-29 DIAGNOSIS — I10 Essential (primary) hypertension: Secondary | ICD-10-CM | POA: Diagnosis not present

## 2020-03-29 DIAGNOSIS — M5116 Intervertebral disc disorders with radiculopathy, lumbar region: Secondary | ICD-10-CM | POA: Diagnosis not present

## 2020-03-30 ENCOUNTER — Other Ambulatory Visit: Payer: Self-pay | Admitting: Neurosurgery

## 2020-03-31 ENCOUNTER — Other Ambulatory Visit: Payer: Self-pay | Admitting: Neurosurgery

## 2020-04-10 NOTE — Progress Notes (Signed)
[id number] [address], [place] [address] Phone: [phone] Fax: [phone]      Your procedure is scheduled on October 19  Report to [facility] Main Entrance "A" at 1030 A.M., and check in at the Admitting office.  Call this number if you have problems the morning of surgery:  [phone]  Call [phone] if you have any questions prior to your surgery date Monday-Friday 8am-4pm    Remember:  Do not eat or drink after midnight the night before your surgery    Take these medicines the morning of surgery with A SIP OF WATER  acetaminophen (TYLENOL)  If needed Eye drops if needed budesonide-formoterol (SYMBICORT) chlorhexidine (PERIDEX) fluticasone (FLONASE)  If needed hydrOXYzine (ATARAX/VISTARIL) if needed MYRBETRIQ omeprazole (PRILOSEC) oxyCODONE-acetaminophen (PERCOCET)  If needed for patient but do not combine with Acetaminophen  (Tylenol) primidone (MYSOLINE)  VENTOLIN HFA 108 (90 Base) if needed, Please bring all inhalers with you the day of surgery.    Follow your Physicians instructions on what to do about your leflunomide (ARAVA)   As of today, STOP taking any Aspirin (unless otherwise instructed by your surgeon) Aleve, Naproxen, Ibuprofen, Motrin, Advil, Goody's, BC's, all herbal medications, fish oil, and all vitamins. diclofenac sodium (VOLTAREN)    WHAT DO I DO ABOUT MY DIABETES MEDICATION?   Marland Kitchen Do not take oral diabetes medicines (pills) the morning of surgery. metFORMIN (GLUCOPHAGE) .   Marland Kitchen The day of surgery, do not take other diabetes injectables, including Byetta (exenatide), Bydureon (exenatide ER), Victoza (liraglutide), or Trulicity (dulaglutide).   HOW TO MANAGE YOUR DIABETES BEFORE AND AFTER SURGERY  Why is it important to control my blood sugar before and after surgery? . Improving blood sugar levels before and after surgery helps healing and can limit problems. . A way of  improving blood sugar control is eating a healthy diet by: o  Eating less sugar and carbohydrates o  Increasing activity/exercise o  Talking with your doctor about reaching your blood sugar goals . High blood sugars (greater than 180 mg/dL) can raise your risk of infections and slow your recovery, so you will need to focus on controlling your diabetes during the weeks before surgery. . Make sure that the doctor who takes care of your diabetes knows about your planned surgery including the date and location.  How do I manage my blood sugar before surgery? . Check your blood sugar at least 4 times a day, starting 2 days before surgery, to make sure that the level is not too high or low. . Check your blood sugar the morning of your surgery when you wake up and every 2 hours until you get to the Short Stay unit. o If your blood sugar is less than 70 mg/dL, you will need to treat for low blood sugar: - Do not take insulin. - Treat a low blood sugar (less than 70 mg/dL) with  cup of clear juice (cranberry or apple), 4 glucose tablets, OR glucose gel. - Recheck blood sugar in 15 minutes after treatment (to make sure it is greater than 70 mg/dL). If your blood sugar is not greater than 70 mg/dL on recheck, call [phone] for further instructions. . Report your blood sugar to the short stay nurse when you get to Short Stay.  . If you are admitted to the hospital after surgery: o Your blood sugar will be checked by the staff and you will probably be given insulin after surgery (  instead of oral diabetes medicines) to make sure you have good blood sugar levels. o The goal for blood sugar control after surgery is 80-180 mg/dL.                     Do not wear jewelry, make up, or nail polish            Do not wear lotions, powders, perfumes, or deodorant.            Do not shave 48 hours prior to surgery.              Do not bring valuables to the hospital.            [facility] is not responsible for  any belongings or valuables.  Do NOT Smoke (Tobacco/Vaping) or drink Alcohol 24 hours prior to your procedure If you use a CPAP at night, you may bring all equipment for your overnight stay.   Contacts, glasses, dentures or bridgework may not be worn into surgery.      For patients admitted to the hospital, discharge time will be determined by your treatment team.   Patients discharged the day of surgery will not be allowed to drive home, and someone needs to stay with them for 24 hours.    Special instructions:   [facility]- Preparing For Surgery  Before surgery, you can play an important role. Because skin is not sterile, your skin needs to be as free of germs as possible. You can reduce the number of germs on your skin by washing with CHG (chlorahexidine gluconate) Soap before surgery.  CHG is an antiseptic cleaner which kills germs and bonds with the skin to continue killing germs even after washing.    Oral Hygiene is also important to reduce your risk of infection.  Remember - BRUSH YOUR TEETH THE MORNING OF SURGERY WITH YOUR REGULAR TOOTHPASTE  Please do not use if you have an allergy to CHG or antibacterial soaps. If your skin becomes reddened/irritated stop using the CHG.  Do not shave (including legs and underarms) for at least 48 hours prior to first CHG shower. It is OK to shave your face.  Please follow these instructions carefully.   1. Shower the NIGHT BEFORE SURGERY and the MORNING OF SURGERY with CHG Soap.   2. If you chose to wash your hair, wash your hair first as usual with your normal shampoo.  3. After you shampoo, rinse your hair and body thoroughly to remove the shampoo.  4. Use CHG as you would any other liquid soap. You can apply CHG directly to the skin and wash gently with a scrungie or a clean washcloth.   5. Apply the CHG Soap to your body ONLY FROM THE NECK DOWN.  Do not use on open wounds or open sores. Avoid contact with your eyes, ears, mouth and  genitals (private parts). Wash Face and genitals (private parts)  with your normal soap.   6. Wash thoroughly, paying special attention to the area where your surgery will be performed.  7. Thoroughly rinse your body with warm water from the neck down.  8. DO NOT shower/wash with your normal soap after using and rinsing off the CHG Soap.  9. Pat yourself dry with a CLEAN TOWEL.  10. Wear CLEAN PAJAMAS to bed the night before surgery  11. Place CLEAN SHEETS on your bed the night of your first shower and DO NOT SLEEP WITH PETS.  Day of Surgery: Wear Clean/Comfortable clothing the morning of surgery Do not apply any deodorants/lotions.   Remember to brush your teeth WITH YOUR REGULAR TOOTHPASTE.   Please read over the following fact sheets that you were given.

## 2020-04-11 ENCOUNTER — Inpatient Hospital Stay (HOSPITAL_COMMUNITY)
Admission: RE | Admit: 2020-04-11 | Discharge: 2020-04-11 | Disposition: A | Discharge status: Home / Self Care | Payer: Medicare Other | Source: Ambulatory Visit

## 2020-04-11 ENCOUNTER — Other Ambulatory Visit (HOSPITAL_COMMUNITY): Payer: Medicare Other

## 2020-04-11 NOTE — Progress Notes (Signed)
Called patient at [phone] to find out if she was coming to her 0800 appointment.  She stated that the surgeon's office had rescheduled her to 04/25/20.  I told her I would let my scheduler Baker Janus know and she would probably be reaching back out to reschedule this pre admission appointment.

## 2020-04-14 MED ORDER — VANCOMYCIN HCL IN DEXTROSE 1-5 GM/200ML-% IV SOLN
INTRAVENOUS | Status: AC
  Filled 2020-04-14: qty 200

## 2020-04-14 MED ORDER — MIDAZOLAM HCL 2 MG/2ML IJ SOLN
INTRAMUSCULAR | Status: AC
  Filled 2020-04-14: qty 2

## 2020-04-14 MED ORDER — FENTANYL CITRATE (PF) 100 MCG/2ML IJ SOLN
INTRAMUSCULAR | Status: AC
  Filled 2020-04-14: qty 2

## 2020-04-21 ENCOUNTER — Other Ambulatory Visit: Payer: Self-pay

## 2020-04-21 ENCOUNTER — Encounter (HOSPITAL_COMMUNITY)
Admission: RE | Admit: 2020-04-21 | Discharge: 2020-04-21 | Disposition: A | Discharge status: Home / Self Care | Payer: Medicare Other | Source: Ambulatory Visit | Attending: Neurosurgery | Admitting: Neurosurgery

## 2020-04-21 ENCOUNTER — Other Ambulatory Visit (HOSPITAL_COMMUNITY)
Admission: RE | Admit: 2020-04-21 | Discharge: 2020-04-21 | Disposition: A | Discharge status: Home / Self Care | Payer: Medicare Other | Source: Ambulatory Visit | Attending: Neurosurgery | Admitting: Neurosurgery

## 2020-04-21 ENCOUNTER — Encounter (HOSPITAL_COMMUNITY): Payer: Self-pay

## 2020-04-21 DIAGNOSIS — Z01812 Encounter for preprocedural laboratory examination: Secondary | ICD-10-CM | POA: Insufficient documentation

## 2020-04-21 DIAGNOSIS — I10 Essential (primary) hypertension: Secondary | ICD-10-CM | POA: Diagnosis not present

## 2020-04-21 DIAGNOSIS — Z20822 Contact with and (suspected) exposure to covid-19: Secondary | ICD-10-CM | POA: Diagnosis not present

## 2020-04-21 DIAGNOSIS — Z01818 Encounter for other preprocedural examination: Secondary | ICD-10-CM | POA: Diagnosis not present

## 2020-04-21 HISTORY — DX: Anemia, unspecified: D64.9

## 2020-04-21 LAB — TYPE AND SCREEN
ABO/RH(D): A POS
Antibody Screen: NEGATIVE

## 2020-04-21 LAB — CBC
HCT: 36.1 % (ref 36.0–46.0)
Hemoglobin: 11.2 g/dL — ABNORMAL LOW (ref 12.0–15.0)
MCH: 27.7 pg (ref 26.0–34.0)
MCHC: 31 g/dL (ref 30.0–36.0)
MCV: 89.1 fL (ref 80.0–100.0)
Platelets: 252 10*3/uL (ref 150–400)
RBC: 4.05 MIL/uL (ref 3.87–5.11)
RDW: 14.2 % (ref 11.5–15.5)
WBC: 8 10*3/uL (ref 4.0–10.5)
nRBC: 0 % (ref 0.0–0.2)

## 2020-04-21 LAB — BASIC METABOLIC PANEL
Anion gap: 10 (ref 5–15)
BUN: 9 mg/dL (ref 8–23)
CO2: 22 mmol/L (ref 22–32)
Calcium: 9.2 mg/dL (ref 8.9–10.3)
Chloride: 102 mmol/L (ref 98–111)
Creatinine, Ser: 0.57 mg/dL (ref 0.44–1.00)
GFR, Estimated: >60 mL/min (ref >60)
Glucose, Bld: 196 mg/dL — ABNORMAL HIGH (ref 70–99)
Potassium: 3.8 mmol/L (ref 3.5–5.1)
Sodium: 134 mmol/L — ABNORMAL LOW (ref 135–145)

## 2020-04-21 LAB — SARS CORONAVIRUS 2 (TAT 6-24 HRS): SARS Coronavirus 2: NEGATIVE

## 2020-04-21 LAB — GLUCOSE, CAPILLARY: Glucose-Capillary: 197 mg/dL — ABNORMAL HIGH (ref 70–99)

## 2020-04-21 LAB — SURGICAL PCR SCREEN
MRSA, PCR: NEGATIVE
Staphylococcus aureus: POSITIVE — AB

## 2020-04-21 NOTE — Progress Notes (Signed)
PCP - Dr. Jeannine Kitten Cardiologist - saw Dr. Einar Gip in 2015 but was told she did not need to follow up with him.  EKG - today Stress Test - 01/31/12 ECHO - 01/23/12 Cardiac Cath - 2015  Sleep Study - 2018 - negative  Fasting Blood Sugar - usually around 120's  Checks Blood Sugar ___1__ times a day - meter was broken, got a new one, but Medicare won't send strips until November   COVID TEST- today   Anesthesia review: No  Patient denies shortness of breath, fever, cough and chest pain at PAT appointment   All instructions explained to the patient, with a verbal understanding of the material. Patient agrees to go over the instructions while at home for a better understanding. Patient also instructed to self quarantine after being tested for COVID-19. The opportunity to ask questions was provided.

## 2020-04-21 NOTE — Pre-Procedure Instructions (Signed)
Michelle [name]  04/21/2020    Your procedure is scheduled on Tuesday, April 25, 2020 at 12 noon.   Report to [facility] Entrance "A" Admitting Office at 10:00 AM.   Call this number if you have problems the morning of surgery: [phone]   Questions prior to day of surgery, please call [phone] between 8 & 4 PM.   Remember:  Do not eat or drink after midnight Monday, 04/24/20.  Take these medicines the morning of surgery with A SIP OF WATER: Myrbetriq, Primidone (Mysoline), Omeprazole (Prilosec), Symbicort inhaler,  Acetaminophen (Tylenol) - if needed, Cyclobenzaprine (Flexeril) - if needed, Hydroxyzine (Vistaril) - if needed, Oxycodone (Percocet) - if needed, Nitroglycerin - if needed, Flonase - if needed, Ventolin inhaler - if needed (bring this inhaler with you day of surgery)  Check with your physician to see if you need to hold your Leflunomide Jolee Ewing) prior to surgery. Stop Multivitamins and NSAIDS (Diclofenac, Voltaren, Ibuprofen, Aleve, etc) as of today. Do not use Herbal medications or Fish oil prior to surgery.  Do not take Metformin day of surgery.  HOW TO MANAGE YOUR DIABETES BEFORE AND AFTER SURGERY  Why is it important to control my blood sugar before and after surgery?  Improving blood sugar levels before and after surgery helps healing and can limit problems.  A way of improving blood sugar control is eating a healthy diet by: o  Eating less sugar and carbohydrates o  Increasing activity/exercise o  Talking with your doctor about reaching your blood sugar goals  High blood sugars (greater than 180 mg/dL) can raise your risk of infections and slow your recovery, so you will need to focus on controlling your diabetes during the weeks before surgery.  Make sure that the doctor who takes care of your diabetes knows about your planned surgery including the date and location.  How do I manage my blood sugar before surgery?  Check your blood sugar at  least 4 times a day, starting 2 days before surgery, to make sure that the level is not too high or low.  Check your blood sugar the morning of your surgery when you wake up and every 2 hours until you get to the Short Stay unit. o If your blood sugar is less than 70 mg/dL, you will need to treat for low blood sugar: - Do not take insulin. - Treat a low blood sugar (less than 70 mg/dL) with  cup of clear juice (cranberry or apple), 4 glucose tablets, OR glucose gel. - Recheck blood sugar in 15 minutes after treatment (to make sure it is greater than 70 mg/dL). If your blood sugar is not greater than 70 mg/dL on recheck, call [phone] for further instructions.  Report your blood sugar to the short stay nurse when you get to Short Stay.   If you are admitted to the hospital after surgery: o Your blood sugar will be checked by the staff and you will probably be given insulin after surgery (instead of oral diabetes medicines) to make sure you have good blood sugar levels. o The goal for blood sugar control after surgery is 80-180 mg/dL.    Do not wear jewelry, make-up or nail polish.  Do not wear lotions, powders, perfumes or deodorant.  Men may shave face and neck.  Do not bring valuables to the hospital.  [facility] is not responsible for any belongings or valuables.  Contacts, dentures or bridgework may not be worn into surgery.  Leave your  suitcase in the car.  After surgery it may be brought to your room.  For patients admitted to the hospital, discharge time will be determined by your treatment team.  [facility] - Preparing for Surgery  Before surgery, you can play an important role.  Because skin is not sterile, your skin needs to be as free of germs as possible.  You can reduce the number of germs on you skin by washing with CHG (chlorahexidine gluconate) soap before surgery.  CHG is an antiseptic cleaner which kills germs and bonds with the skin to continue killing germs even  after washing.  Oral Hygiene is also important in reducing the risk of infection.  Remember to brush your teeth with your regular toothpaste the morning of surgery.  Please DO NOT use if you have an allergy to CHG or antibacterial soaps.  If your skin becomes reddened/irritated stop using the CHG and inform your nurse when you arrive at Short Stay.  Do not shave (including legs and underarms) for at least 48 hours prior to the first CHG shower.  You may shave your face.  Please follow these instructions carefully:   1.  Shower with CHG Soap the night before surgery and the morning of Surgery.  2.  If you choose to wash your hair, wash your hair first as usual with your normal shampoo.  3.  After you shampoo, rinse your hair and body thoroughly to remove the shampoo. 4.  Use CHG as you would any other liquid soap.  You can apply chg directly to the skin and wash gently with a      scrungie or washcloth.           5.  Apply the CHG Soap to your body ONLY FROM THE NECK DOWN.   Do not use on open wounds or open sores. Avoid contact with your eyes, ears, mouth and genitals (private parts).  Wash genitals (private parts) with your normal soap - do this prior to using CHG soap.  6.  Wash thoroughly, paying special attention to the area where your surgery will be performed.  7.  Thoroughly rinse your body with warm water from the neck down.  8.  DO NOT shower/wash with your normal soap after using and rinsing off the CHG Soap.  9.  Pat yourself dry with a clean towel.            10.  Wear clean pajamas.            11.  Place clean sheets on your bed the night of your first shower and do not sleep with pets.  Day of Surgery  Shower as above. Do not apply any lotions/deodorants the morning of surgery.   Please wear clean clothes to the hospital. Remember to brush your teeth with toothpaste.  Please read over the fact sheets that you were given.

## 2020-04-22 LAB — HEMOGLOBIN A1C
Hgb A1c MFr Bld: 7.2 % — ABNORMAL HIGH (ref 4.8–5.6)
Mean Plasma Glucose: 160 mg/dL

## 2020-04-24 ENCOUNTER — Telehealth: Payer: Self-pay | Admitting: Family Medicine

## 2020-04-24 NOTE — Telephone Encounter (Signed)
Patient is calling to inform Dr. Jeannine Kitten that she is having her back surgery tomorrow. She did not leave any other detail. She just stated that her surgeons office suggested letting him know.   If Dr. Jeannine Kitten has any questions the best call back number is  [phone]

## 2020-04-24 NOTE — H&P (Signed)
Chief Complaint   No chief complaint on file.   HPI   HPI: Michelle [name] is a 69 y.o. female who previously underwent L4-5 decompression and fusion a little over 2 years ago, who developed recurrent back and left leg pain several months ago. No RLE symptoms or bowel/badder dysfunction. Pain is severe and interferes with ADLs. She underwent MRI lumbar spine which revealed adjacent level disease at L5-S1 with severe left sided stenosis.She has failed reasonable conservative treatment.  She presents today for surgical decompression fusion at L5-S1 with extension of prior L4-5 fusion. She is without any concerns.  Patient Active Problem List   Diagnosis Date Noted  . Bowel and bladder incontinence 03/05/2020  . Encounter for immunization 03/05/2020  . Headache 07/17/2019  . Poor appetite 07/17/2019  . Balance problem 12/11/2018  . Dry skin 12/11/2018  . Onychomycosis 12/02/2018  . Lumbar radiculopathy 04/07/2018  . Left hip pain 09/11/2017  . Urge incontinence of urine 04/18/2017  . Chronic frontal sinusitis 08/28/2016  . Chronic left maxillary sinusitis 08/28/2016  . Hearing loss 07/19/2015  . Squamous cell carcinoma in situ of skin 11/25/2014  . Encounter for chronic pain management 08/05/2014  . Pulmonary nodules 03/02/2014  . Radial styloid tenosynovitis 08/15/2013  . Pain in joint, pelvic region and thigh 11/18/2012  . Essential hypertension 10/30/2012  . Irritable bowel 07/13/2012  . Restless leg syndrome 05/14/2012  . Gastroparesis 11/19/2011  . Dysphagia 09/10/2011  . Osteoporosis 08/08/2011  . Osteopenia 08/08/2011  . Shoulder joint pain 03/25/2011  . Restrictive lung disease 08/09/2010  . Asthma, chronic 03/14/2010  . Seropositive rheumatoid arthritis 01/08/2010  . Carpal tunnel syndrome, left 05/29/2009  . GERD (gastroesophageal reflux disease) 08/22/2008  . Type II diabetes mellitus 08/28/2006  . Hyperlipidemia 08/28/2006  . Generalized anxiety disorder  08/28/2006  . Allergic rhinitis 08/28/2006    PMH: Past Medical History:  Diagnosis Date  . Allergic rhinitis 08/28/2006  . Anemia    low iron  . Asthma, chronic 03/14/2010   Does seem to use her albuterol excessively. Consider change to more manageable medicines and treat more like COPD in the future.  . Carpal tunnel syndrome, left 05/29/2009   Last Assessment & Plan:  Advised to wear wrist brace at night when she is sleeping.  If this does not help will consider having her go to sports medicine.  . Chronic frontal sinusitis 08/28/2016  . Chronic left maxillary sinusitis 08/28/2016  . Diverticulosis   . Essential hypertension 10/30/2012  . Gastroparesis 11/19/2011   DM and chronic narcotics contribute.  Causes dysphagia like symptoms   . Generalized anxiety disorder 08/28/2006  . GERD (gastroesophageal reflux disease)   . Hearing loss 07/19/2015   Hearing aid in right ear  . History of cataracts, bilateral   . Hyperlipidemia 08/28/2006  . Lumbar radiculopathy 04/07/2018  . Lung nodules   . Osteoporosis 08/08/2011  . Pain in joint, pelvic region and thigh 11/18/2012  . Radial styloid tenosynovitis 08/15/2013  . Restless leg syndrome 05/14/2012  . Seropositive rheumatoid arthritis 01/08/2010   Patient is being followed by rheumatologist at [facility] patient is on Plaquenil and as well as prednisone.   Changed to methotrexate and prednisone in May of 2013  Patient does have a pain contract here with [facility] receives oxycodone 10/325 mg tabs every 6 hours. This has been titrated down from significant doses of methadone previously. Could attempt to tit  . Shoulder joint pain 03/25/2011  The patient is compensating due to her not being able to use her left arm right now rotator cuff impingement if he continues to worsen would not be surprised if she does get a tear on this side as well  . Squamous cell carcinoma in situ of skin 11/25/2014   Referred to  dermatology for excision   . Tear of medial meniscus of knee, left 04/27/2009  . Type II diabetes mellitus 08/28/2006   Overview:  Patient has her ups and downs, go in line with flares of her RA and prednisone uses.  Patient does have hx of hypoglycemic events so would not try for perfect control but should have A1c goal around 7.0 Lab Results  Component Value Date   HGBA1C 6.9 12/27/2011   Last Assessment & Plan:  At goal. Will continue current metformin    PSH: Past Surgical History:  Procedure Laterality Date  . ABDOMINAL HYSTERECTOMY    . APPENDECTOMY    . CARPAL TUNNEL RELEASE Right   . CATARACT EXTRACTION Left    with lid lift  . CHOLECYSTECTOMY    . COLONOSCOPY    . HERNIA REPAIR    . KNEE SURGERY Right    x2- cartilage annd scar tissue  . LEFT AND RIGHT HEART CATHETERIZATION WITH CORONARY ANGIOGRAM N/A 08/17/2013   Procedure: LEFT AND RIGHT HEART CATHETERIZATION WITH CORONARY ANGIOGRAM;  Surgeon: Laverda Page, MD;  Location: [facility] CATH LAB;  Service: Cardiovascular;  Laterality: N/A;  . lumbar back surgery  04/04/2018   done by neurosurgeron  . right foot bone spurs removed    . ROTATOR CUFF REPAIR Right   . SHOULDER SURGERY Left    x2- rotatar cuff tear  . UPPER GASTROINTESTINAL ENDOSCOPY      No medications prior to admission.    SH: Social History   Tobacco Use  . Smoking status: Former Smoker    Packs/day: 1.50    Years: 5.00    Pack years: 7.50    Types: Cigarettes    Quit date: 05/31/1994    Years since quitting: 25.9  . Smokeless tobacco: Never Used  Vaping Use  . Vaping Use: Never used  Substance Use Topics  . Alcohol use: No    Alcohol/week: 0.0 standard drinks  . Drug use: No    MEDS: Prior to Admission medications   Medication Sig Start Date End Date Taking? Authorizing Provider  acetaminophen (TYLENOL) 500 MG tablet Take 1,000 mg by mouth every 6 (six) hours as needed for moderate pain or headache.   Yes [provider]  ALREX 0.2 %  SUSP Place 1 drop into both eyes 3 (three) times daily.  10/21/19  Yes [provider]  budesonide-formoterol (SYMBICORT) 160-4.5 MCG/ACT inhaler Inhale 2 puffs into the lungs 2 (two) times daily.   Yes [provider]  cetirizine (EQ ALLERGY RELIEF, CETIRIZINE,) 10 MG tablet Take 1 tablet (10 mg total) by mouth at bedtime. 01/18/19  Yes [name], MD  chlorhexidine (PERIDEX) 0.12 % solution Use as directed 15 mLs in the mouth or throat 2 (two) times daily. Patient taking differently: Use as directed 15 mLs in the mouth or throat 2 (two) times daily as needed (mouth sores).  10/18/19  Yes [name], MD  cyclobenzaprine (FLEXERIL) 10 MG tablet Take 10 mg by mouth at bedtime as needed for muscle spasms.  09/06/19  Yes [provider]  diclofenac sodium (VOLTAREN) 1 % GEL Apply 2 g topically 4 (four) times daily  as needed (for pain).   Yes [provider]  Dulaglutide (TRULICITY) 1.5 AL/9.3XT SOPN INJECT 1.5 MG INTO THE SKIN ONCE A WEEK Patient taking differently: Inject 1.5 mg as directed every Wednesday.  12/02/19  Yes [name], MD  ferrous sulfate 325 (65 FE) MG tablet Take 1 tablet (325 mg total) by mouth daily with breakfast. 12/11/18  Yes McDiarmid, Blane Ohara, MD  fluticasone (FLONASE) 50 MCG/ACT nasal spray Place 2 sprays into both nostrils daily as needed for allergies. 12/02/19  Yes [name], MD  gabapentin (NEURONTIN) 300 MG capsule Take 1 capsule (300 mg total) by mouth at bedtime. 12/02/19  Yes [name], MD  hydrOXYzine (ATARAX/VISTARIL) 25 MG tablet Take 25 mg by mouth daily as needed for itching.  09/14/19  Yes [provider]  inFLIXimab (REMICADE) 100 MG injection Inject 100 mg into the vein every 7 (seven) weeks.    Yes [provider]  leflunomide (ARAVA) 10 MG tablet Take 10 mg by mouth daily.   Yes [provider]  losartan (COZAAR) 25 MG tablet Take 1/2 (one-half) tablet by mouth once daily Patient  taking differently: Take 12.5 mg by mouth daily.  12/02/19  Yes [name], MD  metFORMIN (GLUCOPHAGE) 1000 MG tablet Take 1 tablet (1,000 mg total) by mouth 2 (two) times daily with a meal. 12/02/19  Yes [name], MD  mometasone (ELOCON) 0.1 % cream Apply 1 application topically 2 (two) times daily as needed (sores).  11/12/18  Yes [provider]  Multiple Vitamin (MULTIVITAMIN WITH MINERALS) TABS tablet Take 1 tablet by mouth daily.   Yes [provider]  MYRBETRIQ 25 MG TB24 tablet Take 25 mg by mouth daily. 11/26/19  Yes [provider]  nitroGLYCERIN (NITROSTAT) 0.4 MG SL tablet Place 1 tablet (0.4 mg total) under the tongue every 5 (five) minutes as needed. 04/02/18  Yes Everrett Coombe, MD  omeprazole (PRILOSEC) 40 MG capsule Take 1 capsule (40 mg total) by mouth 2 (two) times daily. Patient taking differently: Take 40 mg by mouth See admin instructions. Take 40 mg daily, may take a second 40 mg dose as needed for heartburn 12/02/19  Yes [name], MD  oxyCODONE-acetaminophen (PERCOCET) 10-325 MG tablet Take 1 tablet by mouth every 6 (six) hours as needed for pain. 03/18/20  Yes [name], MD  primidone (MYSOLINE) 50 MG tablet TAKE 2 TABLETS BY MOUTH ONCE DAILY IN THE MORNING AND 1 IN THE EVENING Patient taking differently: Take 50-100 mg by mouth See admin instructions. Take 100 mg in the morning and 50 mg in the evening 12/20/19  Yes Tat, Wells Guiles S, DO  rosuvastatin (CRESTOR) 10 MG tablet Take 1 tablet (10 mg total) by mouth daily. Patient taking differently: Take 10 mg by mouth at bedtime.  12/02/19  Yes [name], MD  sertraline (ZOLOFT) 50 MG tablet Take 1 tablet (50 mg total) by mouth daily. Patient taking differently: Take 50 mg by mouth at bedtime.  12/02/19  Yes [name], MD  VENTOLIN HFA 108 (90 Base) MCG/ACT inhaler INHALE 2 PUFFS BY MOUTH EVERY 4 HOURS AS NEEDED FOR WHEEZING OR SHORTNESS OF BREATH Patient taking differently: Inhale 2  puffs into the lungs every 4 (four) hours as needed for wheezing or shortness of breath.  12/16/19  Yes [name], MD  vitamin B-12 (CYANOCOBALAMIN) 1000 MCG tablet Take 1,000 mcg by mouth daily.   Yes [provider]  Blood  Glucose Monitoring Suppl [facility] VERIO) w/Device KIT Check blood glucose once a day 03/10/20   [name], MD  glucose blood [facility] VERIO) test strip Use to test blood glucose once daily. Dx Code: e11.9 03/17/20   [name], MD  Incontinence Supply Disposable (INCONTINENCE BRIEF MEDIUM) MISC Use as needed for incontinence 03/10/20   [name], MD  Pramoxine HCl 1 % CREA Apply 1 Dose topically as directed. Patient not taking: Reported on 04/03/2020 12/11/18   McDiarmid, Blane Ohara, MD    ALLERGY: Allergies  Allergen Reactions  . Zofran [Ondansetron Hcl] Other (See Comments)    Prolong QT  . Ace Inhibitors Cough    Occurred with liinopril  . Levofloxacin Other (See Comments)    Felt things on her legs that were not there,made her stomach hurt  . Lisinopril Cough  . Sulfonamide Derivatives Hives and Itching    Social History   Tobacco Use  . Smoking status: Former Smoker    Packs/day: 1.50    Years: 5.00    Pack years: 7.50    Types: Cigarettes    Quit date: 05/31/1994    Years since quitting: 25.9  . Smokeless tobacco: Never Used  Substance Use Topics  . Alcohol use: No    Alcohol/week: 0.0 standard drinks     Family History  Problem Relation Age of Onset  . Stroke Father 79  . Colon cancer Maternal Aunt   . Tremor Mother   . Lung cancer Brother 41  . Rheum arthritis Sister 49  . Colon cancer Daughter   . COPD Sister   . Cancer Sister        in nose  . Diabetes Daughter   . Hypertension Daughter   . Healthy Daughter   . Bell's palsy Maternal Grandmother   . Esophageal cancer Neg Hx   . Rectal cancer Neg Hx   . Stomach cancer Neg Hx      ROS   ROS  Exam   There were no vitals filed for this visit. General  appearance: WDWN, NAD GCS: 15   - Eyes: 1- no eye opening, 2- eyes open to pain, 3-eyes open to command or shout, 4- eyes open spontaneously   - Verbal response: 1- no verbal response, 2- incomprehnsible speech/sound, 3- inappropriate response, 4- confused, but able to answer questions, 5= oriented  - Motor: 1= No motor, 2- extensor response, 3- spastic flexion, 4- withdraws from pain, 5= purposeful movement to pain, 6= obeys command Eyes: No scleral injection Cardiovascular: Regular rate and rhythm without murmurs, rubs, gallops. No edema or variciosities. Distal pulses normal. Pulmonary: Effort normal, non-labored breathing Musculoskeletal:     Muscle tone upper extremities: Normal    Muscle tone lower extremities: Normal    Motor exam: Upper Extremities Deltoid Bicep Tricep Grip  Right 5/5 5/5 5/5 5/5  Left 5/5 5/5 5/5 5/5   Lower Extremity IP Quad PF DF EHL  Right 5/5 5/5 5/5 5/5 5/5  Left 5/5 5/5 5/5 5/5 5/5   Neurological Mental Status:    - Patient is awake, alert, oriented to person, place, month, year, and situation    - Patient is able to give a clear and coherent history.    - No signs of aphasia or neglect Cranial Nerves    - II: Visual Fields are full. PERRL    - III/IV/VI: EOMI without ptosis or diploplia.     - V: Facial sensation is grossly normal    -  VII: Facial movement is symmetric.     - VIII: hearing is intact to voice    - X: Uvula elevates symmetrically    - XI: Shoulder shrug is symmetric.    - XII: tongue is midline without atrophy or fasciculations.  Sensory: Sensation grossly intact to LT  Results - Imaging/Labs   No results found for this or any previous visit (from the past 48 hour(s)).  No results found.  IMAGING: MRI of the lumbar spine dated 03/05/2020 was personally reviewed.  This demonstrates significant artifact at the previously operated L4-5 level, without any obvious stenosis.  Primary findings at L5-S1 where there is a relatively  broad based left eccentric disc protrusion which is slightly worsened since 2019. This results in moderate to severe left-sided lateral recess stenosis and likely compression of the S1 nerve root.    Impression/Plan   69 y.o. female with left-sided leg pain related to adjacent level disease at L5-S1 2 years after L4-5 fusion.  Symptoms are causing limitations in normal daily activities. She has failed  Reasonable conservative treatment. We will proceed with surgical decompression and fusion at L5-S1 including placement of interbody cages, interbody arthrodesis, and extension of instrumentation S1.  We have reviewed the indications for surgery, the associated risks, benefits and alternatives at length in the office.  All questions today were answered and consent was obtained.  Consuella Lose, MD [facility]

## 2020-04-25 ENCOUNTER — Inpatient Hospital Stay (HOSPITAL_COMMUNITY): Payer: Medicare Other | Admitting: Vascular Surgery

## 2020-04-25 ENCOUNTER — Observation Stay (HOSPITAL_COMMUNITY)
Admission: RE | Admit: 2020-04-25 | Discharge: 2020-04-26 | Disposition: A | Discharge status: Home / Self Care | Payer: Medicare Other | Attending: Neurosurgery | Admitting: Neurosurgery

## 2020-04-25 ENCOUNTER — Other Ambulatory Visit: Payer: Self-pay

## 2020-04-25 ENCOUNTER — Inpatient Hospital Stay (HOSPITAL_COMMUNITY): Payer: Medicare Other

## 2020-04-25 ENCOUNTER — Inpatient Hospital Stay (HOSPITAL_COMMUNITY): Payer: Medicare Other | Admitting: Certified Registered"

## 2020-04-25 ENCOUNTER — Encounter (HOSPITAL_COMMUNITY)
Admission: RE | Disposition: A | Discharge status: Home / Self Care | Payer: Self-pay | Source: Home / Self Care | Attending: Neurosurgery

## 2020-04-25 ENCOUNTER — Encounter (HOSPITAL_COMMUNITY): Payer: Self-pay | Admitting: Neurosurgery

## 2020-04-25 DIAGNOSIS — Z79899 Other long term (current) drug therapy: Secondary | ICD-10-CM | POA: Diagnosis not present

## 2020-04-25 DIAGNOSIS — M5416 Radiculopathy, lumbar region: Secondary | ICD-10-CM | POA: Diagnosis present

## 2020-04-25 DIAGNOSIS — M4326 Fusion of spine, lumbar region: Secondary | ICD-10-CM | POA: Diagnosis not present

## 2020-04-25 DIAGNOSIS — Z7984 Long term (current) use of oral hypoglycemic drugs: Secondary | ICD-10-CM | POA: Insufficient documentation

## 2020-04-25 DIAGNOSIS — Z87891 Personal history of nicotine dependence: Secondary | ICD-10-CM | POA: Diagnosis not present

## 2020-04-25 DIAGNOSIS — E119 Type 2 diabetes mellitus without complications: Secondary | ICD-10-CM | POA: Diagnosis not present

## 2020-04-25 DIAGNOSIS — Z981 Arthrodesis status: Secondary | ICD-10-CM | POA: Diagnosis not present

## 2020-04-25 DIAGNOSIS — I10 Essential (primary) hypertension: Secondary | ICD-10-CM | POA: Insufficient documentation

## 2020-04-25 DIAGNOSIS — J45909 Unspecified asthma, uncomplicated: Secondary | ICD-10-CM | POA: Insufficient documentation

## 2020-04-25 DIAGNOSIS — M48061 Spinal stenosis, lumbar region without neurogenic claudication: Secondary | ICD-10-CM | POA: Diagnosis not present

## 2020-04-25 DIAGNOSIS — Z419 Encounter for procedure for purposes other than remedying health state, unspecified: Secondary | ICD-10-CM

## 2020-04-25 DIAGNOSIS — M5116 Intervertebral disc disorders with radiculopathy, lumbar region: Principal | ICD-10-CM | POA: Insufficient documentation

## 2020-04-25 LAB — GLUCOSE, CAPILLARY
Glucose-Capillary: 132 mg/dL — ABNORMAL HIGH (ref 70–99)
Glucose-Capillary: 125 mg/dL — ABNORMAL HIGH (ref 70–99)
Glucose-Capillary: 204 mg/dL — ABNORMAL HIGH (ref 70–99)
Glucose-Capillary: 147 mg/dL — ABNORMAL HIGH (ref 70–99)

## 2020-04-25 SURGERY — POSTERIOR LUMBAR FUSION 1 LEVEL
Anesthesia: General | Site: Back

## 2020-04-25 MED ORDER — ADULT MULTIVITAMIN W/MINERALS CH
1.0000 | ORAL_TABLET | Freq: Every day | ORAL | Status: DC
  Administered 2020-04-25: 1 via ORAL
  Filled 2020-04-25: qty 1

## 2020-04-25 MED ORDER — FLUTICASONE PROPIONATE 50 MCG/ACT NA SUSP
2.0000 | Freq: Every day | NASAL | Status: DC | PRN
  Filled 2020-04-25: qty 16

## 2020-04-25 MED ORDER — BUPIVACAINE HCL (PF) 0.5 % IJ SOLN
INTRAMUSCULAR | Status: AC
  Filled 2020-04-25: qty 30

## 2020-04-25 MED ORDER — METHOCARBAMOL 1000 MG/10ML IJ SOLN
500.0000 mg | Freq: Four times a day (QID) | INTRAVENOUS | Status: DC | PRN
  Filled 2020-04-25: qty 5

## 2020-04-25 MED ORDER — CHLORHEXIDINE GLUCONATE CLOTH 2 % EX PADS: 6.0000 | MEDICATED_PAD | Freq: Once | CUTANEOUS | Status: DC

## 2020-04-25 MED ORDER — PROPOFOL 10 MG/ML IV BOLUS
INTRAVENOUS | Status: DC | PRN
  Administered 2020-04-25: 80 mg via INTRAVENOUS

## 2020-04-25 MED ORDER — SENNA 8.6 MG PO TABS
1.0000 | ORAL_TABLET | Freq: Two times a day (BID) | ORAL | Status: DC
  Administered 2020-04-25: 8.6 mg via ORAL
  Filled 2020-04-25: qty 1

## 2020-04-25 MED ORDER — ROCURONIUM BROMIDE 10 MG/ML (PF) SYRINGE
PREFILLED_SYRINGE | INTRAVENOUS | Status: AC
  Filled 2020-04-25: qty 10

## 2020-04-25 MED ORDER — LIDOCAINE 2% (20 MG/ML) 5 ML SYRINGE
INTRAMUSCULAR | Status: DC | PRN
  Administered 2020-04-25: 100 mg via INTRAVENOUS

## 2020-04-25 MED ORDER — LIDOCAINE-EPINEPHRINE 1 %-1:100000 IJ SOLN
INTRAMUSCULAR | Status: AC
  Filled 2020-04-25: qty 1

## 2020-04-25 MED ORDER — MIDAZOLAM HCL 2 MG/2ML IJ SOLN
INTRAMUSCULAR | Status: DC | PRN
  Administered 2020-04-25: 2 mg via INTRAVENOUS

## 2020-04-25 MED ORDER — HYDROMORPHONE HCL 1 MG/ML IJ SOLN: 0.2500 mg | INTRAMUSCULAR | Status: DC | PRN

## 2020-04-25 MED ORDER — LORATADINE 10 MG PO TABS
10.0000 mg | ORAL_TABLET | Freq: Every day | ORAL | Status: DC
  Administered 2020-04-25: 10 mg via ORAL
  Filled 2020-04-25: qty 1

## 2020-04-25 MED ORDER — DEXAMETHASONE SODIUM PHOSPHATE 10 MG/ML IJ SOLN
INTRAMUSCULAR | Status: AC
  Filled 2020-04-25: qty 1

## 2020-04-25 MED ORDER — ARTIFICIAL TEARS OPHTHALMIC OINT
TOPICAL_OINTMENT | OPHTHALMIC | Status: AC
  Filled 2020-04-25: qty 3.5

## 2020-04-25 MED ORDER — FLEET ENEMA 7-19 GM/118ML RE ENEM: 1.0000 | ENEMA | Freq: Once | RECTAL | Status: DC | PRN

## 2020-04-25 MED ORDER — SODIUM CHLORIDE 0.9% FLUSH
3.0000 mL | Freq: Two times a day (BID) | INTRAVENOUS | Status: DC
  Administered 2020-04-25: 3 mL via INTRAVENOUS

## 2020-04-25 MED ORDER — CEFAZOLIN SODIUM-DEXTROSE 2-4 GM/100ML-% IV SOLN
2.0000 g | INTRAVENOUS | Status: AC
  Administered 2020-04-25: 2 g via INTRAVENOUS
  Filled 2020-04-25: qty 100

## 2020-04-25 MED ORDER — FENTANYL CITRATE (PF) 250 MCG/5ML IJ SOLN
INTRAMUSCULAR | Status: AC
  Filled 2020-04-25: qty 5

## 2020-04-25 MED ORDER — ARTIFICIAL TEARS OPHTHALMIC OINT
TOPICAL_OINTMENT | OPHTHALMIC | Status: DC | PRN
  Administered 2020-04-25: 1 via OPHTHALMIC

## 2020-04-25 MED ORDER — MENTHOL 3 MG MT LOZG: 1.0000 | LOZENGE | OROMUCOSAL | Status: DC | PRN

## 2020-04-25 MED ORDER — PANTOPRAZOLE SODIUM 40 MG PO TBEC: 80.0000 mg | DELAYED_RELEASE_TABLET | Freq: Every day | ORAL | Status: DC

## 2020-04-25 MED ORDER — ROSUVASTATIN CALCIUM 5 MG PO TABS
10.0000 mg | ORAL_TABLET | Freq: Every day | ORAL | Status: DC
  Administered 2020-04-25: 10 mg via ORAL
  Filled 2020-04-25: qty 2

## 2020-04-25 MED ORDER — HYDROXYZINE HCL 50 MG/ML IM SOLN
50.0000 mg | Freq: Four times a day (QID) | INTRAMUSCULAR | Status: DC | PRN
  Administered 2020-04-25 (×2): 50 mg via INTRAMUSCULAR
  Filled 2020-04-25 (×2): qty 1

## 2020-04-25 MED ORDER — SUGAMMADEX SODIUM 200 MG/2ML IV SOLN
INTRAVENOUS | Status: DC | PRN
  Administered 2020-04-25: 130 mg via INTRAVENOUS

## 2020-04-25 MED ORDER — SENNOSIDES-DOCUSATE SODIUM 8.6-50 MG PO TABS: 1.0000 | ORAL_TABLET | Freq: Every evening | ORAL | Status: DC | PRN

## 2020-04-25 MED ORDER — LIDOCAINE-EPINEPHRINE 1 %-1:100000 IJ SOLN
INTRAMUSCULAR | Status: DC | PRN
  Administered 2020-04-25: 4 mL

## 2020-04-25 MED ORDER — PHENOL 1.4 % MT LIQD: 1.0000 | OROMUCOSAL | Status: DC | PRN

## 2020-04-25 MED ORDER — 0.9 % SODIUM CHLORIDE (POUR BTL) OPTIME
TOPICAL | Status: DC | PRN
  Administered 2020-04-25: 1000 mL

## 2020-04-25 MED ORDER — DOCUSATE SODIUM 100 MG PO CAPS
100.0000 mg | ORAL_CAPSULE | Freq: Two times a day (BID) | ORAL | Status: DC
  Administered 2020-04-25: 100 mg via ORAL
  Filled 2020-04-25: qty 1

## 2020-04-25 MED ORDER — KETAMINE HCL 50 MG/5ML IJ SOSY
PREFILLED_SYRINGE | INTRAMUSCULAR | Status: AC
  Filled 2020-04-25: qty 5

## 2020-04-25 MED ORDER — SERTRALINE HCL 50 MG PO TABS
50.0000 mg | ORAL_TABLET | Freq: Every day | ORAL | Status: DC
  Administered 2020-04-25: 50 mg via ORAL
  Filled 2020-04-25: qty 1

## 2020-04-25 MED ORDER — LEFLUNOMIDE 20 MG PO TABS
10.0000 mg | ORAL_TABLET | Freq: Every day | ORAL | Status: DC
  Administered 2020-04-25: 10 mg via ORAL
  Filled 2020-04-25 (×2): qty 0.5

## 2020-04-25 MED ORDER — OXYCODONE HCL 5 MG PO TABS
5.0000 mg | ORAL_TABLET | ORAL | Status: DC | PRN
  Administered 2020-04-25: 10 mg via ORAL
  Administered 2020-04-25: 5 mg via ORAL
  Administered 2020-04-25 – 2020-04-26 (×2): 10 mg via ORAL
  Administered 2020-04-26: 5 mg via ORAL
  Filled 2020-04-25 (×2): qty 1
  Filled 2020-04-25 (×3): qty 2

## 2020-04-25 MED ORDER — PHENYLEPHRINE HCL-NACL 10-0.9 MG/250ML-% IV SOLN
INTRAVENOUS | Status: DC | PRN
  Administered 2020-04-25: 50 ug/min via INTRAVENOUS

## 2020-04-25 MED ORDER — BISACODYL 10 MG RE SUPP: 10.0000 mg | Freq: Every day | RECTAL | Status: DC | PRN

## 2020-04-25 MED ORDER — HYDROMORPHONE HCL 1 MG/ML IJ SOLN
INTRAMUSCULAR | Status: AC
  Administered 2020-04-25: 0.25 mg via INTRAVENOUS
  Filled 2020-04-25: qty 1

## 2020-04-25 MED ORDER — DEXAMETHASONE SODIUM PHOSPHATE 10 MG/ML IJ SOLN
INTRAMUSCULAR | Status: DC | PRN
  Administered 2020-04-25: 10 mg via INTRAVENOUS

## 2020-04-25 MED ORDER — LIDOCAINE 2% (20 MG/ML) 5 ML SYRINGE
INTRAMUSCULAR | Status: AC
  Filled 2020-04-25: qty 5

## 2020-04-25 MED ORDER — SODIUM CHLORIDE 0.9% FLUSH: 3.0000 mL | INTRAVENOUS | Status: DC | PRN

## 2020-04-25 MED ORDER — HEMOSTATIC AGENTS (NO CHARGE) OPTIME
TOPICAL | Status: DC | PRN
  Administered 2020-04-25 (×2): 1 via TOPICAL

## 2020-04-25 MED ORDER — SODIUM CHLORIDE 0.9 % IV SOLN: INTRAVENOUS | Status: DC

## 2020-04-25 MED ORDER — GABAPENTIN 300 MG PO CAPS
300.0000 mg | ORAL_CAPSULE | Freq: Every day | ORAL | Status: DC
  Administered 2020-04-25: 300 mg via ORAL
  Filled 2020-04-25: qty 1

## 2020-04-25 MED ORDER — FENTANYL CITRATE (PF) 250 MCG/5ML IJ SOLN
INTRAMUSCULAR | Status: DC | PRN
  Administered 2020-04-25 (×5): 50 ug via INTRAVENOUS
  Administered 2020-04-25: 100 ug via INTRAVENOUS

## 2020-04-25 MED ORDER — HYDROMORPHONE HCL 1 MG/ML IJ SOLN
0.5000 mg | INTRAMUSCULAR | Status: DC | PRN
  Administered 2020-04-25: 1 mg via INTRAVENOUS
  Filled 2020-04-25: qty 1

## 2020-04-25 MED ORDER — EPINEPHRINE 1 MG/10ML IJ SOSY
PREFILLED_SYRINGE | INTRAMUSCULAR | Status: AC
  Filled 2020-04-25: qty 20

## 2020-04-25 MED ORDER — MIRABEGRON ER 25 MG PO TB24
25.0000 mg | ORAL_TABLET | Freq: Every day | ORAL | Status: DC
  Filled 2020-04-25: qty 1

## 2020-04-25 MED ORDER — ORAL CARE MOUTH RINSE: 15.0000 mL | Freq: Once | OROMUCOSAL | Status: AC

## 2020-04-25 MED ORDER — PROPOFOL 10 MG/ML IV BOLUS
INTRAVENOUS | Status: AC
  Filled 2020-04-25: qty 20

## 2020-04-25 MED ORDER — MIDAZOLAM HCL 2 MG/2ML IJ SOLN
INTRAMUSCULAR | Status: AC
  Filled 2020-04-25: qty 2

## 2020-04-25 MED ORDER — LACTATED RINGERS IV SOLN: INTRAVENOUS | Status: DC

## 2020-04-25 MED ORDER — VITAMIN B-12 1000 MCG PO TABS
1000.0000 ug | ORAL_TABLET | Freq: Every day | ORAL | Status: DC
  Administered 2020-04-25: 1000 ug via ORAL
  Filled 2020-04-25: qty 1

## 2020-04-25 MED ORDER — ACETAMINOPHEN 325 MG PO TABS: 650.0000 mg | ORAL_TABLET | ORAL | Status: DC | PRN

## 2020-04-25 MED ORDER — CHLORHEXIDINE GLUCONATE 0.12 % MT SOLN
15.0000 mL | Freq: Once | OROMUCOSAL | Status: AC
  Administered 2020-04-25: 15 mL via OROMUCOSAL
  Filled 2020-04-25: qty 15

## 2020-04-25 MED ORDER — LOSARTAN POTASSIUM 25 MG PO TABS
12.5000 mg | ORAL_TABLET | Freq: Every day | ORAL | Status: DC
  Administered 2020-04-25: 12.5 mg via ORAL
  Filled 2020-04-25 (×2): qty 0.5

## 2020-04-25 MED ORDER — FERROUS SULFATE 325 (65 FE) MG PO TABS
325.0000 mg | ORAL_TABLET | Freq: Every day | ORAL | Status: DC
  Administered 2020-04-26: 325 mg via ORAL
  Filled 2020-04-25: qty 1

## 2020-04-25 MED ORDER — MEPERIDINE HCL 25 MG/ML IJ SOLN: 6.2500 mg | INTRAMUSCULAR | Status: DC | PRN

## 2020-04-25 MED ORDER — ACETAMINOPHEN 500 MG PO TABS
1000.0000 mg | ORAL_TABLET | Freq: Four times a day (QID) | ORAL | Status: AC
  Administered 2020-04-25 – 2020-04-26 (×4): 1000 mg via ORAL
  Filled 2020-04-25 (×4): qty 2

## 2020-04-25 MED ORDER — HYDROCODONE-ACETAMINOPHEN 5-325 MG PO TABS: 1.0000 | ORAL_TABLET | ORAL | Status: DC | PRN

## 2020-04-25 MED ORDER — FLUTICASONE FUROATE-VILANTEROL 200-25 MCG/INH IN AEPB
1.0000 | INHALATION_SPRAY | Freq: Every day | RESPIRATORY_TRACT | Status: DC
  Administered 2020-04-25: 1 via RESPIRATORY_TRACT
  Filled 2020-04-25: qty 28

## 2020-04-25 MED ORDER — PRIMIDONE 50 MG PO TABS
100.0000 mg | ORAL_TABLET | Freq: Every day | ORAL | Status: DC
  Filled 2020-04-25: qty 2

## 2020-04-25 MED ORDER — ACETAMINOPHEN 650 MG RE SUPP: 650.0000 mg | RECTAL | Status: DC | PRN

## 2020-04-25 MED ORDER — ONDANSETRON HCL 4 MG/2ML IJ SOLN: 4.0000 mg | Freq: Once | INTRAMUSCULAR | Status: DC | PRN

## 2020-04-25 MED ORDER — CYCLOBENZAPRINE HCL 10 MG PO TABS: 10.0000 mg | ORAL_TABLET | Freq: Every evening | ORAL | Status: DC | PRN

## 2020-04-25 MED ORDER — BUPIVACAINE HCL (PF) 0.5 % IJ SOLN
INTRAMUSCULAR | Status: DC | PRN
  Administered 2020-04-25: 4 mL

## 2020-04-25 MED ORDER — METFORMIN HCL 500 MG PO TABS
1000.0000 mg | ORAL_TABLET | Freq: Two times a day (BID) | ORAL | Status: DC
  Administered 2020-04-25 – 2020-04-26 (×2): 1000 mg via ORAL
  Filled 2020-04-25 (×2): qty 2

## 2020-04-25 MED ORDER — METHOCARBAMOL 500 MG PO TABS: 500.0000 mg | ORAL_TABLET | Freq: Four times a day (QID) | ORAL | Status: DC | PRN

## 2020-04-25 MED ORDER — ROCURONIUM BROMIDE 10 MG/ML (PF) SYRINGE
PREFILLED_SYRINGE | INTRAVENOUS | Status: DC | PRN
  Administered 2020-04-25: 70 mg via INTRAVENOUS
  Administered 2020-04-25: 30 mg via INTRAVENOUS

## 2020-04-25 MED ORDER — PRIMIDONE 50 MG PO TABS
50.0000 mg | ORAL_TABLET | Freq: Every day | ORAL | Status: DC
  Administered 2020-04-25: 50 mg via ORAL
  Filled 2020-04-25: qty 1

## 2020-04-25 MED ORDER — HYDROXYZINE HCL 25 MG PO TABS
25.0000 mg | ORAL_TABLET | Freq: Every day | ORAL | Status: DC | PRN
  Administered 2020-04-25: 25 mg via ORAL
  Filled 2020-04-25: qty 1

## 2020-04-25 MED ORDER — INFLIXIMAB 100 MG IV SOLR: 100.0000 mg | INTRAVENOUS | Status: DC

## 2020-04-25 MED ORDER — CEFAZOLIN SODIUM-DEXTROSE 2-4 GM/100ML-% IV SOLN
2.0000 g | Freq: Three times a day (TID) | INTRAVENOUS | Status: AC
  Administered 2020-04-25 (×2): 2 g via INTRAVENOUS
  Filled 2020-04-25 (×2): qty 100

## 2020-04-25 SURGICAL SUPPLY — 78 items
ADH SKN CLS APL DERMABOND .7 (GAUZE/BANDAGES/DRESSINGS) ×1
APL SKNCLS STERI-STRIP NONHPOA (GAUZE/BANDAGES/DRESSINGS)
BASKET BONE COLLECTION (BASKET) ×3 IMPLANT
BENZOIN TINCTURE PRP APPL 2/3 (GAUZE/BANDAGES/DRESSINGS) IMPLANT
BLADE CLIPPER SURG (BLADE) IMPLANT
BLADE SURG 11 STRL SS (BLADE) ×3 IMPLANT
BUR MATCHSTICK NEURO 3.0 LAGG (BURR) ×3 IMPLANT
BUR PRECISION FLUTE 5.0 (BURR) ×5 IMPLANT
CAGE INTERBODY SHORT 7X29X24 (Cage) ×4 IMPLANT
CANISTER SUCT 3000ML PPV (MISCELLANEOUS) ×3 IMPLANT
CARTRIDGE OIL MAESTRO DRILL (MISCELLANEOUS) ×1 IMPLANT
CLOSURE WOUND 1/2 X4 (GAUZE/BANDAGES/DRESSINGS)
CNTNR URN SCR LID CUP LEK RST (MISCELLANEOUS) ×1 IMPLANT
CONT SPEC 4OZ STRL OR WHT (MISCELLANEOUS) ×3
COVER BACK TABLE 60X90IN (DRAPES) ×3 IMPLANT
COVER WAND RF STERILE (DRAPES) ×3 IMPLANT
DECANTER SPIKE VIAL GLASS SM (MISCELLANEOUS) ×3 IMPLANT
DERMABOND ADVANCED (GAUZE/BANDAGES/DRESSINGS) ×2
DERMABOND ADVANCED .7 DNX12 (GAUZE/BANDAGES/DRESSINGS) ×1 IMPLANT
DIFFUSER DRILL AIR PNEUMATIC (MISCELLANEOUS) ×3 IMPLANT
DRAPE C-ARM 42X72 X-RAY (DRAPES) ×3 IMPLANT
DRAPE C-ARMOR (DRAPES) ×3 IMPLANT
DRAPE LAPAROTOMY 100X72X124 (DRAPES) ×3 IMPLANT
DRAPE SURG 17X23 STRL (DRAPES) ×3 IMPLANT
DRSG OPSITE POSTOP 4X6 (GAUZE/BANDAGES/DRESSINGS) ×2 IMPLANT
DRSG OPSITE POSTOP 4X8 (GAUZE/BANDAGES/DRESSINGS) ×2 IMPLANT
DURAPREP 26ML APPLICATOR (WOUND CARE) ×3 IMPLANT
ELECT REM PT RETURN 9FT ADLT (ELECTROSURGICAL) ×3
ELECTRODE REM PT RTRN 9FT ADLT (ELECTROSURGICAL) ×1 IMPLANT
GAUZE 4X4 16PLY RFD (DISPOSABLE) IMPLANT
GAUZE SPONGE 4X4 12PLY STRL (GAUZE/BANDAGES/DRESSINGS) IMPLANT
GLOVE BIO SURGEON STRL SZ7.5 (GLOVE) ×4 IMPLANT
GLOVE BIOGEL PI IND STRL 7.0 (GLOVE) IMPLANT
GLOVE BIOGEL PI IND STRL 7.5 (GLOVE) ×2 IMPLANT
GLOVE BIOGEL PI INDICATOR 7.0 (GLOVE) ×4
GLOVE BIOGEL PI INDICATOR 7.5 (GLOVE) ×10
GLOVE ECLIPSE 7.0 STRL STRAW (GLOVE) ×6 IMPLANT
GLOVE ECLIPSE 7.5 STRL STRAW (GLOVE) ×2 IMPLANT
GLOVE EXAM NITRILE XL STR (GLOVE) IMPLANT
GLOVE SS BIOGEL STRL SZ 7 (GLOVE) IMPLANT
GLOVE SUPERSENSE BIOGEL SZ 7 (GLOVE) ×10
GOWN STRL REUS W/ TWL LRG LVL3 (GOWN DISPOSABLE) ×4 IMPLANT
GOWN STRL REUS W/ TWL XL LVL3 (GOWN DISPOSABLE) IMPLANT
GOWN STRL REUS W/TWL 2XL LVL3 (GOWN DISPOSABLE) IMPLANT
GOWN STRL REUS W/TWL LRG LVL3 (GOWN DISPOSABLE) ×15
GOWN STRL REUS W/TWL XL LVL3 (GOWN DISPOSABLE) ×3
HEMOSTAT POWDER KIT SURGIFOAM (HEMOSTASIS) ×1 IMPLANT
KIT BASIN OR (CUSTOM PROCEDURE TRAY) ×3 IMPLANT
KIT INFUSE XX SMALL 0.7CC (Orthopedic Implant) ×2 IMPLANT
KIT POSITION SURG JACKSON T1 (MISCELLANEOUS) ×3 IMPLANT
KIT TURNOVER KIT B (KITS) ×3 IMPLANT
MILL MEDIUM DISP (BLADE) ×3 IMPLANT
NDL HYPO 18GX1.5 BLUNT FILL (NEEDLE) IMPLANT
NDL SPNL 18GX3.5 QUINCKE PK (NEEDLE) IMPLANT
NEEDLE HYPO 18GX1.5 BLUNT FILL (NEEDLE) IMPLANT
NEEDLE HYPO 22GX1.5 SAFETY (NEEDLE) ×3 IMPLANT
NEEDLE SPNL 18GX3.5 QUINCKE PK (NEEDLE) IMPLANT
NS IRRIG 1000ML POUR BTL (IV SOLUTION) ×3 IMPLANT
OIL CARTRIDGE MAESTRO DRILL (MISCELLANEOUS) ×3
PACK LAMINECTOMY NEURO (CUSTOM PROCEDURE TRAY) ×3 IMPLANT
PAD ARMBOARD 7.5X6 YLW CONV (MISCELLANEOUS) ×9 IMPLANT
PUTTY DBF GRAFTON 3CC W/DELIVE (Putty) ×2 IMPLANT
ROD SOLERA 60MM (Rod) ×4 IMPLANT
SCREW SET SOLERA (Screw) ×18 IMPLANT
SCREW SET SOLERA TI (Screw) IMPLANT
SCREW SOLERA 6.5X35 (Screw) ×4 IMPLANT
SPONGE LAP 4X18 RFD (DISPOSABLE) IMPLANT
SPONGE SURGIFOAM ABS GEL 100 (HEMOSTASIS) IMPLANT
SPONGE SURGIFOAM ABS GEL SZ50 (HEMOSTASIS) ×2 IMPLANT
STRIP CLOSURE SKIN 1/2X4 (GAUZE/BANDAGES/DRESSINGS) IMPLANT
SUT VIC AB 0 CT1 18XCR BRD8 (SUTURE) ×1 IMPLANT
SUT VIC AB 0 CT1 8-18 (SUTURE) ×3
SUT VICRYL 3-0 RB1 18 ABS (SUTURE) ×3 IMPLANT
SYR 3ML LL SCALE MARK (SYRINGE) ×9 IMPLANT
TOWEL GREEN STERILE (TOWEL DISPOSABLE) ×3 IMPLANT
TOWEL GREEN STERILE FF (TOWEL DISPOSABLE) ×3 IMPLANT
TRAY FOLEY MTR SLVR 16FR STAT (SET/KITS/TRAYS/PACK) ×3 IMPLANT
WATER STERILE IRR 1000ML POUR (IV SOLUTION) ×3 IMPLANT

## 2020-04-25 NOTE — Progress Notes (Signed)
Orthopedic Tech Progress Note Patient Details:  Michelle [name] 1950/07/25 [id number] RN said patient has brace Patient ID: Michelle [name], female   DOB: [birth year], 69 y.o.   MRN: [id number]   Janit Pagan 04/25/2020, 1:12 PM

## 2020-04-25 NOTE — Progress Notes (Signed)
Physical Therapy Evaluation Patient Details Name: Michelle [name] MRN: [id number] DOB: Nov 08, 1950 Today's Date: 04/25/2020   History of Present Illness  Pt is a 69 y/o female s/p L5-S1 PLIF. PMH includes asthma, HTN, osteoporosis, RA, and DM.   Clinical Impression  Patient is s/p above surgery resulting in the deficits listed below (see PT Problem List). Pt with increased nausea/vomiting which limited gait. Required min guard A for gait within room using RW. Reviewed back precautions with pt. Pt reports she does not want any HH services.  Patient will benefit from skilled PT to increase their independence and safety with mobility (while adhering to their precautions) to allow discharge to the venue listed below.     Follow Up Recommendations No PT follow up    Equipment Recommendations  None recommended by PT    Recommendations for Other Services       Precautions / Restrictions Precautions Precautions: Back Precaution Booklet Issued: Yes (comment) Precaution Comments: Reviewed back precautions.  Required Braces or Orthoses: Spinal Brace Spinal Brace: Lumbar corset Restrictions Weight Bearing Restrictions: No      Mobility  Bed Mobility Overal bed mobility: Needs Assistance Bed Mobility: Sit to Sidelying;Rolling Rolling: Supervision       Sit to sidelying: Supervision General bed mobility comments: Pt sitting at EOB upon entry. Supervision to return to supine.     Transfers Overall transfer level: Needs assistance Equipment used: Rolling walker (2 wheeled) Transfers: Sit to/from Stand Sit to Stand: Min guard         General transfer comment: Min guard for safety. Cues for safe hand placement.   Ambulation/Gait Ambulation/Gait assistance: Min guard Gait Distance (Feet): 10 Feet Assistive device: Rolling walker (2 wheeled) Gait Pattern/deviations: Step-through pattern;Decreased stride length Gait velocity: Decreased   General Gait Details: Guarded gait.  Upon exiting the room, pt with increased nausea, so returned to bed. Pt with episode of vomiting upon return to sitting. RN aware and gave nausea meds.   Stairs            Wheelchair Mobility    Modified Rankin (Stroke Patients Only)       Balance Overall balance assessment: Needs assistance Sitting-balance support: No upper extremity supported Sitting balance-Leahy Scale: Good     Standing balance support: Bilateral upper extremity supported Standing balance-Leahy Scale: Poor Standing balance comment: Reliant on BUE support                              Pertinent Vitals/Pain Pain Assessment: Faces Faces Pain Scale: Hurts even more Pain Location: back Pain Descriptors / Indicators: Aching;Operative site guarding Pain Intervention(s): Limited activity within patient's tolerance;Monitored during session;Repositioned    Home Living Family/patient expects to be discharged to:: Private residence Living Arrangements: Spouse/significant other;Other relatives (granddaughter) Available Help at Discharge: Family Type of Home: House Home Access: Stairs to enter Entrance Stairs-Rails: Right Entrance Stairs-Number of Steps: 3 Home Layout: One level Home Equipment: Bedside commode;Walker - 4 wheels      Prior Function Level of Independence: Independent               Hand Dominance        Extremity/Trunk Assessment   Upper Extremity Assessment Upper Extremity Assessment: Defer to OT evaluation    Lower Extremity Assessment Lower Extremity Assessment: Generalized weakness    Cervical / Trunk Assessment Cervical / Trunk Assessment: Other exceptions Cervical / Trunk Exceptions: s/p PLIF   Communication  Communication: No difficulties  Cognition Arousal/Alertness: Awake/alert Behavior During Therapy: WFL for tasks assessed/performed Overall Cognitive Status: Within Functional Limits for tasks assessed                                         General Comments      Exercises     Assessment/Plan    PT Assessment Patient needs continued PT services  PT Problem List Decreased strength;Decreased balance;Decreased activity tolerance;Decreased mobility;Decreased knowledge of use of DME;Decreased knowledge of precautions;Pain       PT Treatment Interventions DME instruction;Gait training;Functional mobility training;Therapeutic activities;Therapeutic exercise;Balance training;Stair training;Patient/family education    PT Goals (Current goals can be found in the Care Plan section)  Acute Rehab PT Goals Patient Stated Goal: to feel better PT Goal Formulation: With patient Time For Goal Achievement: 05/09/20 Potential to Achieve Goals: Good    Frequency Min 5X/week   Barriers to discharge        Co-evaluation               AM-PAC PT "6 Clicks" Mobility  Outcome Measure Help needed turning from your back to your side while in a flat bed without using bedrails?: A Little Help needed moving from lying on your back to sitting on the side of a flat bed without using bedrails?: A Little Help needed moving to and from a bed to a chair (including a wheelchair)?: A Little Help needed standing up from a chair using your arms (e.g., wheelchair or bedside chair)?: A Little Help needed to walk in hospital room?: A Little Help needed climbing 3-5 steps with a railing? : A Little 6 Click Score: 18    End of Session Equipment Utilized During Treatment: Gait belt;Back brace Activity Tolerance: Treatment limited secondary to medical complications (Comment) (nausea/vomiting) Patient left: in bed;with call bell/phone within reach Nurse Communication: Mobility status PT Visit Diagnosis: Other abnormalities of gait and mobility (R26.89);Pain Pain - part of body:  (back)    Time: 5188-4166 PT Time Calculation (min) (ACUTE ONLY): 26 min   Charges:   PT Evaluation $PT Eval Low Complexity: 1 Low PT  Treatments $Therapeutic Activity: 8-22 mins        Lou Miner, DPT  Acute Rehabilitation Services  Pager: [phone] Office: [phone]   Rudean Hitt 04/25/2020, 4:45 PM

## 2020-04-25 NOTE — Anesthesia Procedure Notes (Signed)
Procedure Name: Intubation Date/Time: 04/25/2020 8:00 AM Performed by: Lance Coon, CRNA Pre-anesthesia Checklist: Patient identified, Emergency Drugs available, Suction available, Patient being monitored and Timeout performed Patient Re-evaluated:Patient Re-evaluated prior to induction Oxygen Delivery Method: Circle system utilized Preoxygenation: Pre-oxygenation with 100% oxygen Induction Type: IV induction Ventilation: Mask ventilation without difficulty Laryngoscope Size: Miller and 2 Grade View: Grade I Tube type: Oral Tube size: 7.0 mm Number of attempts: 1 Airway Equipment and Method: Stylet Placement Confirmation: ETT inserted through vocal cords under direct vision,  positive ETCO2 and breath sounds checked- equal and bilateral Secured at: 21 cm Tube secured with: Tape Dental Injury: Teeth and Oropharynx as per pre-operative assessment

## 2020-04-25 NOTE — Transfer of Care (Signed)
Immediate Anesthesia Transfer of Care Note  Patient: Michelle [name]  Procedure(s) Performed: POSTERIOR LUMBAR INTERBODY Willette Pa FIVE- SACRAL ONE, EXTENSION OF HARDWARE TO SACRAL ONE (N/A Back)  Patient Location: PACU  Anesthesia Type:General  Level of Consciousness: drowsy and patient cooperative  Airway & Oxygen Therapy: Patient Spontanous Breathing and Patient connected to face mask oxygen  Post-op Assessment: Report given to RN and Post -op Vital signs reviewed and stable  Post vital signs: Reviewed and stable  Last Vitals:  Vitals Value Taken Time  BP    Temp    Pulse    Resp    SpO2      Last Pain:  Vitals:   04/25/20 0557  TempSrc:   PainSc: 5          Complications: No complications documented.

## 2020-04-25 NOTE — Anesthesia Preprocedure Evaluation (Signed)
Anesthesia Evaluation  Patient identified by MRN, date of birth, ID band Patient awake    Reviewed: Allergy & Precautions, NPO status , Patient's Chart, lab work & pertinent test results  Airway Mallampati: I  TM Distance: >3 FB Neck ROM: Full    Dental   Pulmonary former smoker,    Pulmonary exam normal        Cardiovascular hypertension, Pt. on medications Normal cardiovascular exam     Neuro/Psych Anxiety    GI/Hepatic GERD  Medicated and Controlled,  Endo/Other  diabetes, Type 2, Oral Hypoglycemic Agents  Renal/GU      Musculoskeletal   Abdominal   Peds  Hematology   Anesthesia Other Findings   Reproductive/Obstetrics                             Anesthesia Physical Anesthesia Plan  ASA: II  Anesthesia Plan: General   Post-op Pain Management:    Induction: Intravenous  PONV Risk Score and Plan: 3 and Ondansetron and Midazolam  Airway Management Planned: Oral ETT  Additional Equipment:   Intra-op Plan:   Post-operative Plan: Extubation in OR  Informed Consent: I have reviewed the patients History and Physical, chart, labs and discussed the procedure including the risks, benefits and alternatives for the proposed anesthesia with the patient or authorized representative who has indicated his/her understanding and acceptance.       Plan Discussed with: CRNA and Surgeon  Anesthesia Plan Comments:         Anesthesia Quick Evaluation

## 2020-04-25 NOTE — Progress Notes (Signed)
PT Cancellation Note  Patient Details Name: Michelle [name] MRN: [id number] DOB: 10/24/1950   Cancelled Treatment:    Reason Eval/Treat Not Completed: Medical issues which prohibited therapy Pt with nausea and vomiting. Will follow up as pt appropriate and as schedule allows.   Lou Miner, DPT  Acute Rehabilitation Services  Pager: [phone] Office: [phone]    Rudean Hitt 04/25/2020, 1:49 PM

## 2020-04-25 NOTE — Anesthesia Postprocedure Evaluation (Signed)
Anesthesia Post Note  Patient: Michelle [name]  Procedure(s) Performed: POSTERIOR LUMBAR INTERBODY FUSION,LUMBAR FIVE- SACRAL ONE, EXTENSION OF HARDWARE TO SACRAL ONE (N/A Back)     Patient location during evaluation: PACU Anesthesia Type: General Level of consciousness: awake and alert Pain management: pain level controlled Vital Signs Assessment: post-procedure vital signs reviewed and stable Respiratory status: spontaneous breathing, nonlabored ventilation, respiratory function stable and patient connected to nasal cannula oxygen Cardiovascular status: blood pressure returned to baseline and stable Postop Assessment: no apparent nausea or vomiting Anesthetic complications: no   No complications documented.  Last Vitals:  Vitals:   04/25/20 1145 04/25/20 1214  BP: (!) 110/57 105/61  Pulse: 82 84  Resp: 16 14  Temp: (!) 36.3 C 36.6 C  SpO2: 95% 93%    Last Pain:  Vitals:   04/25/20 1230  TempSrc:   PainSc: 8                  [name] DAVID

## 2020-04-25 NOTE — OR Nursing (Addendum)
Pt is awake,alert and oriented.Pt and/or family verbalized understanding of poc and discharge instructions. Reviewed admission and on going care with receiving RN. Pt is in NAD at this time and is ready to be transferred to floor. Belongings on bed with patient Pt ambulated with assist from wheelchair to toilet prior to leaving  Pt has brace at home, educated to bring brace in

## 2020-04-26 DIAGNOSIS — Z87891 Personal history of nicotine dependence: Secondary | ICD-10-CM | POA: Diagnosis not present

## 2020-04-26 DIAGNOSIS — M5116 Intervertebral disc disorders with radiculopathy, lumbar region: Secondary | ICD-10-CM | POA: Diagnosis not present

## 2020-04-26 DIAGNOSIS — J45909 Unspecified asthma, uncomplicated: Secondary | ICD-10-CM | POA: Diagnosis not present

## 2020-04-26 DIAGNOSIS — Z79899 Other long term (current) drug therapy: Secondary | ICD-10-CM | POA: Diagnosis not present

## 2020-04-26 DIAGNOSIS — I10 Essential (primary) hypertension: Secondary | ICD-10-CM | POA: Diagnosis not present

## 2020-04-26 DIAGNOSIS — E119 Type 2 diabetes mellitus without complications: Secondary | ICD-10-CM | POA: Diagnosis not present

## 2020-04-26 LAB — BASIC METABOLIC PANEL
Anion gap: 8 (ref 5–15)
BUN: 9 mg/dL (ref 8–23)
CO2: 22 mmol/L (ref 22–32)
Calcium: 8.5 mg/dL — ABNORMAL LOW (ref 8.9–10.3)
Chloride: 105 mmol/L (ref 98–111)
Creatinine, Ser: 0.5 mg/dL (ref 0.44–1.00)
GFR, Estimated: >60 mL/min (ref >60)
Glucose, Bld: 139 mg/dL — ABNORMAL HIGH (ref 70–99)
Potassium: 4 mmol/L (ref 3.5–5.1)
Sodium: 135 mmol/L (ref 135–145)

## 2020-04-26 LAB — CBC
HCT: 28.2 % — ABNORMAL LOW (ref 36.0–46.0)
Hemoglobin: 8.8 g/dL — ABNORMAL LOW (ref 12.0–15.0)
MCH: 27.5 pg (ref 26.0–34.0)
MCHC: 31.2 g/dL (ref 30.0–36.0)
MCV: 88.1 fL (ref 80.0–100.0)
Platelets: 205 10*3/uL (ref 150–400)
RBC: 3.2 MIL/uL — ABNORMAL LOW (ref 3.87–5.11)
RDW: 14.5 % (ref 11.5–15.5)
WBC: 9.8 10*3/uL (ref 4.0–10.5)
nRBC: 0 % (ref 0.0–0.2)

## 2020-04-26 LAB — APTT: aPTT: 26 seconds (ref 24–36)

## 2020-04-26 LAB — GLUCOSE, CAPILLARY: Glucose-Capillary: 129 mg/dL — ABNORMAL HIGH (ref 70–99)

## 2020-04-26 LAB — PROTIME-INR
INR: 1.1 (ref 0.8–1.2)
Prothrombin Time: 13.7 seconds (ref 11.4–15.2)

## 2020-04-26 NOTE — Progress Notes (Signed)
Physical Therapy Treatment Patient Details Name: Michelle [name] MRN: [id number] DOB: 13-Nov-1950 Today's Date: 04/26/2020    History of Present Illness Pt is a 69 y/o female s/p L5-S1 PLIF. PMH includes asthma, HTN, osteoporosis, RA, and DM.     PT Comments    Pt progressing well with post-op mobility. She was able to demonstrate transfers and ambulation with gross min guard assist and RW for support. Pt was educated on precautions, brace application/wearing schedule, appropriate activity progression, and car transfer in preparation for d/c home today. Will continue to follow until d/c.     Follow Up Recommendations  No PT follow up     Equipment Recommendations  None recommended by PT    Recommendations for Other Services       Precautions / Restrictions Precautions Precautions: Back Precaution Booklet Issued: Yes (comment) Precaution Comments: Reviewed throughout functional mobility Required Braces or Orthoses: Spinal Brace Spinal Brace: Lumbar corset Restrictions Weight Bearing Restrictions: No    Mobility  Bed Mobility               General bed mobility comments: Pt was received sitting up EOB  Transfers Overall transfer level: Needs assistance Equipment used: Rolling walker (2 wheeled) Transfers: Sit to/from Stand Sit to Stand: Min guard         General transfer comment: Min guard for safety. Cues for safe hand placement.   Ambulation/Gait Ambulation/Gait assistance: Min guard Gait Distance (Feet): 200 Feet Assistive device: Rolling walker (2 wheeled) Gait Pattern/deviations: Step-through pattern;Decreased stride length Gait velocity: Decreased Gait velocity interpretation: <1.31 ft/sec, indicative of household ambulator General Gait Details: Slow but generally steady. No assist required however close guard provided as she appeared very guarded.    Stairs Stairs: Yes Stairs assistance: Min guard Stair Management: One rail Left;Step to  pattern;Forwards Number of Stairs: 3 General stair comments: VC's for sequencing and general safety. No assist required however close guard provided for safety.    Wheelchair Mobility    Modified Rankin (Stroke Patients Only)       Balance Overall balance assessment: Needs assistance Sitting-balance support: No upper extremity supported Sitting balance-Leahy Scale: Good     Standing balance support: No upper extremity supported;During functional activity Standing balance-Leahy Scale: Good Standing balance comment: Reliant on BUE support                             Cognition Arousal/Alertness: Awake/alert Behavior During Therapy: WFL for tasks assessed/performed Overall Cognitive Status: Within Functional Limits for tasks assessed                                        Exercises      General Comments        Pertinent Vitals/Pain Pain Assessment: Faces Faces Pain Scale: Hurts a little bit Pain Location: back Pain Descriptors / Indicators: Aching;Operative site guarding Pain Intervention(s): Limited activity within patient's tolerance;Monitored during session;Repositioned    Home Living                      Prior Function            PT Goals (current goals can now be found in the care plan section) Acute Rehab PT Goals Patient Stated Goal: to feel better PT Goal Formulation: With patient Time For Goal Achievement: 05/09/20 Potential to Achieve  Goals: Good Progress towards PT goals: Progressing toward goals    Frequency    Min 5X/week      PT Plan Current plan remains appropriate    Co-evaluation              AM-PAC PT "6 Clicks" Mobility   Outcome Measure  Help needed turning from your back to your side while in a flat bed without using bedrails?: A Little Help needed moving from lying on your back to sitting on the side of a flat bed without using bedrails?: A Little Help needed moving to and from a bed  to a chair (including a wheelchair)?: A Little Help needed standing up from a chair using your arms (e.g., wheelchair or bedside chair)?: A Little Help needed to walk in hospital room?: A Little Help needed climbing 3-5 steps with a railing? : A Little 6 Click Score: 18    End of Session Equipment Utilized During Treatment: Gait belt;Back brace Activity Tolerance: Patient tolerated treatment well Patient left: in bed;with call bell/phone within reach (sitting EOB) Nurse Communication: Mobility status PT Visit Diagnosis: Other abnormalities of gait and mobility (R26.89);Pain Pain - part of body:  (back)     Time: 1594-7076 PT Time Calculation (min) (ACUTE ONLY): 28 min  Charges:  $Gait Training: 23-37 mins                     Rolinda Roan, PT, DPT Acute Rehabilitation Services Pager: [phone] Office: [phone]    Thelma Comp 04/26/2020, 3:11 PM

## 2020-04-26 NOTE — Care Management Obs Status (Signed)
[facility] NOTIFICATION   Patient Details  Name: Michelle [name] MRN: [id number] Date of Birth: 1950-10-12   Medicare Observation Status Notification Given:  Yes    Angelita Ingles, RN 04/26/2020, 8:43 AM

## 2020-04-26 NOTE — Progress Notes (Signed)
  NEUROSURGERY PROGRESS NOTE   PONV overnight, now resolved. Complains of appropriate back soreness. No N/T/W Ambulated with therapy. Voiding normal. Tolerating po.  Eager for d/c  EXAM:  BP 94/60 (BP Location: Right Arm)   Pulse 94   Temp 97.8 F (36.6 C) (Oral)   Resp 18   Ht 5\' 2"  (1.575 m)   Wt 64.4 kg   SpO2 98%   BMI 25.97 kg/m   Awake, alert, oriented  Speech fluent, appropriate  CN grossly intact  5/5 BUE/BLE  Incision: c/d/i  IMPRESSION/PLAN 69 y.o. female POD L5-S1 fusion, with extension of hardware to L4. Doing well. - D/C home

## 2020-04-26 NOTE — Progress Notes (Signed)
Pt doing well. Pt given D/C instructions with verbal understanding. Pt's incision is clean and dry with no sign of infection. Pt's IV was removed prior to D/C. Pt D/C'd home via wheelchair per MD order. Pt is stable @ D/C and has no other needs at this time. Holli Humbles, RN

## 2020-04-26 NOTE — Op Note (Signed)
NEUROSURGERY OPERATIVE NOTE   PREOP DIAGNOSIS:  1. Lumbar disc disease with radiculopathy  POSTOP DIAGNOSIS: Same  PROCEDURE: 1. L5 laminectomy with facetectomy for decompression of exiting nerve roots, more than would be required for placement of interbody graft 2. Placement of anterior interbody device - [employer] expandable 70mm cage x2 3. Posterior segmental instrumentation using cortical pedicle screws at L4 - S1 4. Interbody arthrodesis, L5-S1 5. Removal of previous rod 6. Use of locally harvested bone autograft 7. Use of non-structural bone allograft - XXS InFuse  SURGEON: Dr. Consuella Lose, MD  ASSISTANT: Dr. Duffy Rhody, MD  ANESTHESIA: General Endotracheal  EBL: 200cc  SPECIMENS: None  DRAINS: None  COMPLICATIONS: None immediate  CONDITION: Hemodynamically stable to PACU  HISTORY: Michelle [name] is a 69 year old woman who has been followed in the outpatient Neurosurgery Clinic with recurrent back and primarily left-sided leg pain.  She had previously undergone L4-5 decompression and fusion several years ago.  She attempted multiple conservative treatments however had progression of her pain affecting normal ADLs.  Her MRI demonstrated relatively broad-based disc protrusion at the adjacent L5-S1 level eccentric to the left with compression of the left-sided nerve roots.  She therefore elected to proceed with decompression and extension of fusion to S1.  Risks, benefits, and alternatives to surgery as well as the expected postoperative course were all reviewed in detail with the patient.  After all questions were answered informed consent was obtained and witnessed.  PROCEDURE IN DETAIL: The patient was brought to the operating room via stretcher. After induction of general anesthesia, the patient was positioned on the operative table in the prone position. All pressure points were meticulously padded. Incision was then marked out and prepped and draped in the  usual sterile fashion.  After timeout was conducted, skin was infiltrated with local anesthetic. Previous skin incision was then made sharply and Bovie electrocautery was used to dissect the subcutaneous tissue until the lumbodorsal fascia was identified and incised. Scar tissue from the previous surgery was dissected until the spinous process of L5 was identified.  Subperiosteal dissection was carried out and the previously placed L5 pedicle screws were identified.  Dissection was then carried superiorly along the screw rod construct to identify the L4 pedicle screws.  The entire L5 lamina and medial portion of the L5-S1 facet complex was identified.  Self-retaining retractors were then placed.  At this point attention was turned to decompression. Complete L5 laminectomy was completed with a high-speed drill and Kerrison punches.  Normal dura was identified.  A ball-tipped dissector was then used to identify the foramina bilaterally.  High-speed drill was used to cut across the pars interarticularis and the inferior articulating process of L5 was removed bilaterally.  The exiting L5 nerve roots and the traversing S1 nerve roots were then identified.  After unroofing the entire L5-S1 foramen I was then able to easily pass a ball dissector in the ventral epidural space and around the L5 and S1 nerves indicating good decompression.   Disc space was then identified, incised bilaterally, and using a combination of shavers, curettes and rongeurs, complete discectomy was completed. Endplates were prepared, and bone harvested during decompression was mixed with BMP and packed into the interspace. 18mm expandable cages were tapped into place bilaterally. Cages were expanded to achieve good endplate apposition and good lordosis.  Cages were backfilled with more bone graft. Good position was confirmed with fluoroscopy.  At this point, the entry points for bilateral S1 cortical pedicle screws were identified  using  standard anatomic landmarks and lateral fluoro. Pilot holes were then drilled and tapped to 6.5 x 25mm. Screws were then placed in S1 bilaterally. Previous rod was removed and a new 53mm prebent lordotic rod was then sized and placed into the pedicle screws. Set screws were placed and final tightened. Final AP and lateral fluoroscopic images confirmed good position.  Hemostasis was secured and confirmed with bipolar cautery and morcellized gelfoam with thrombin. The wound was then irrigated with copious amounts of antibiotic saline, then closed in standard fashion using a combination of interrupted 0 and 3-0 Vicryl stitches in the muscular, fascial, and subcutaneous layers. Skin was then closed using standard Dermabond. Sterile dressing was then applied. The patient was then transferred to the stretcher, extubated, and taken to the postanesthesia care unit in stable hemodynamic condition.  At the end of the case all sponge, needle, cottonoid, and instrument counts were correct.

## 2020-04-26 NOTE — Care Management CC44 (Signed)
Condition Code 44 Documentation Completed  Patient Details  Name: SHAKEIA KRUS MRN: [id number] Date of Birth: December 15, 1950   Condition Code 44 given:  Yes Patient signature on Condition Code 44 notice:  Yes Documentation of 2 MD's agreement:  Yes Code 44 added to claim:  Yes    Angelita Ingles, RN 04/26/2020, 8:43 AM

## 2020-04-26 NOTE — Discharge Summary (Signed)
Physician Discharge Summary  Patient ID: Michelle [name] MRN: [id number] DOB/AGE: 02-02-1951 69 y.o.  Admit date: 04/25/2020 Discharge date: 04/26/2020  Admission Diagnoses:  Lumbar radiculopathy  Discharge Diagnoses:  Same Active Problems:   Lumbar radiculopathy   Discharged Condition: Stable  Hospital Course:  Michelle [name] is a 69 y.o. female  who was admitted for the below procedure. Post op complicated by N/V, resolved late evening. At time of discharge, pain was well controlled, ambulating with Pt/OT, tolerating po, voiding normal. Ready for discharge.  Treatments: Surgery - L5-S1 fusion, extension of hardware to L4  Discharge Exam: Blood pressure 94/60, pulse 94, temperature 97.8 F (36.6 C), temperature source Oral, resp. rate 18, height 5' 2"  (1.575 m), weight 64.4 kg, SpO2 98 %. Awake, alert, oriented Speech fluent, appropriate CN grossly intact 5/5 BUE/BLE Wound c/d/i  Disposition:   Discharge Instructions    Call MD for:  difficulty breathing, headache or visual disturbances   Complete by: As directed    Call MD for:  persistant dizziness or light-headedness   Complete by: As directed    Call MD for:  redness, tenderness, or signs of infection (pain, swelling, redness, odor or green/yellow discharge around incision site)   Complete by: As directed    Call MD for:  severe uncontrolled pain   Complete by: As directed    Call MD for:  temperature >100.4   Complete by: As directed    Diet - low sodium heart healthy   Complete by: As directed    Driving Restrictions   Complete by: As directed    Do not drive until given clearance.   Increase activity slowly   Complete by: As directed    Lifting restrictions   Complete by: As directed    Do not lift anything >10lbs. Avoid bending and twisting in awkward positions. Avoid bending at the back.   May shower / Bathe   Complete by: As directed    In 24 hours. Okay to wash wound with warm soapy water.  Avoid scrubbing the wound. Pat dry.   Remove dressing in 48 hours   Complete by: As directed      Allergies as of 04/26/2020      Reactions   Zofran [ondansetron Hcl] Other (See Comments)   Prolong QT   Ace Inhibitors Cough   Occurred with liinopril   Levofloxacin Other (See Comments)   Felt things on her legs that were not there,made her stomach hurt   Lisinopril Cough   Sulfonamide Derivatives Hives, Itching      Medication List    TAKE these medications   acetaminophen 500 MG tablet Commonly known as: TYLENOL Take 1,000 mg by mouth every 6 (six) hours as needed for moderate pain or headache.   Alrex 0.2 % Susp Generic drug: loteprednol Place 1 drop into both eyes 3 (three) times daily.   budesonide-formoterol 160-4.5 MCG/ACT inhaler Commonly known as: SYMBICORT Inhale 2 puffs into the lungs 2 (two) times daily.   cetirizine 10 MG tablet Commonly known as: EQ Allergy Relief (Cetirizine) Take 1 tablet (10 mg total) by mouth at bedtime.   chlorhexidine 0.12 % solution Commonly known as: PERIDEX Use as directed 15 mLs in the mouth or throat 2 (two) times daily. What changed:   when to take this  reasons to take this   cyclobenzaprine 10 MG tablet Commonly known as: FLEXERIL Take 10 mg by mouth at bedtime as needed for muscle spasms.   diclofenac sodium  1 % Gel Commonly known as: VOLTAREN Apply 2 g topically 4 (four) times daily as needed (for pain).   ferrous sulfate 325 (65 FE) MG tablet Take 1 tablet (325 mg total) by mouth daily with breakfast.   fluticasone 50 MCG/ACT nasal spray Commonly known as: FLONASE Place 2 sprays into both nostrils daily as needed for allergies.   gabapentin 300 MG capsule Commonly known as: NEURONTIN Take 1 capsule (300 mg total) by mouth at bedtime.   hydrOXYzine 25 MG tablet Commonly known as: ATARAX/VISTARIL Take 25 mg by mouth daily as needed for itching.   Incontinence Brief Medium Misc Use as needed for  incontinence   inFLIXimab 100 MG injection Commonly known as: REMICADE Inject 100 mg into the vein every 7 (seven) weeks.   leflunomide 10 MG tablet Commonly known as: ARAVA Take 10 mg by mouth daily.   losartan 25 MG tablet Commonly known as: COZAAR Take 1/2 (one-half) tablet by mouth once daily What changed:   how much to take  how to take this  when to take this  additional instructions   metFORMIN 1000 MG tablet Commonly known as: GLUCOPHAGE Take 1 tablet (1,000 mg total) by mouth 2 (two) times daily with a meal.   mometasone 0.1 % cream Commonly known as: ELOCON Apply 1 application topically 2 (two) times daily as needed (sores).   multivitamin with minerals Tabs tablet Take 1 tablet by mouth daily.   Myrbetriq 25 MG Tb24 tablet Generic drug: mirabegron ER Take 25 mg by mouth daily.   nitroGLYCERIN 0.4 MG SL tablet Commonly known as: NITROSTAT Place 1 tablet (0.4 mg total) under the tongue every 5 (five) minutes as needed.   omeprazole 40 MG capsule Commonly known as: PRILOSEC Take 1 capsule (40 mg total) by mouth 2 (two) times daily. What changed:   when to take this  additional instructions   OneTouch Verio test strip Generic drug: glucose blood Use to test blood glucose once daily. Dx Code: e11.9   OneTouch Verio w/Device Kit Check blood glucose once a day   oxyCODONE-acetaminophen 10-325 MG tablet Commonly known as: PERCOCET Take 1 tablet by mouth every 6 (six) hours as needed for pain.   Pramoxine HCl 1 % Crea Apply 1 Dose topically as directed.   primidone 50 MG tablet Commonly known as: MYSOLINE TAKE 2 TABLETS BY MOUTH ONCE DAILY IN THE MORNING AND 1 IN THE EVENING What changed:   how much to take  how to take this  when to take this  additional instructions   rosuvastatin 10 MG tablet Commonly known as: CRESTOR Take 1 tablet (10 mg total) by mouth daily. What changed: when to take this   sertraline 50 MG tablet Commonly  known as: ZOLOFT Take 1 tablet (50 mg total) by mouth daily. What changed: when to take this   Trulicity 1.5 TI/1.4ER Sopn Generic drug: Dulaglutide INJECT 1.5 MG INTO THE SKIN ONCE A WEEK What changed:   how much to take  how to take this  when to take this  additional instructions   Ventolin HFA 108 (90 Base) MCG/ACT inhaler Generic drug: albuterol INHALE 2 PUFFS BY MOUTH EVERY 4 HOURS AS NEEDED FOR WHEEZING OR SHORTNESS OF BREATH What changed:   how much to take  how to take this  when to take this  reasons to take this  additional instructions   vitamin B-12 1000 MCG tablet Commonly known as: CYANOCOBALAMIN Take 1,000 mcg by mouth daily.  Follow-up Information    Consuella Lose, MD Follow up.   Specialty: Neurosurgery Contact information: [address] [place] [address] [phone]               Signed: Traci Sermon 04/26/2020, 7:44 AM

## 2020-04-26 NOTE — Evaluation (Signed)
Occupational Therapy Evaluation Patient Details Name: JERRICKA CARVEY MRN: [id number] DOB: 11-12-1950 Today's Date: 04/26/2020    History of Present Illness Pt is a 69 y/o female s/p L5-S1 PLIF. PMH includes asthma, HTN, osteoporosis, RA, and DM.    Clinical Impression   Pt BWG:[id number] at home with spouse and granddaughter. Pt reports independence with ADL and mobility. Pt able to perform LB dressing, shower transfer simulation, and ambulation for ADL with supervisionA to modified independence. Pt verbalizing and performing correct technique for bed mobility and ADL functional mobility with no AD. Education provided for LB ADL AE as needed. No further continued OT skilled services. OT signing off. Thank you.     Follow Up Recommendations  No OT follow up    Equipment Recommendations  None recommended by OT    Recommendations for Other Services       Precautions / Restrictions Precautions Precautions: Back Precaution Booklet Issued: Yes (comment) Precaution Comments: Verbalizing back precautions. Required Braces or Orthoses: Spinal Brace Spinal Brace: Lumbar corset Restrictions Weight Bearing Restrictions: No      Mobility Bed Mobility Overal bed mobility: Needs Assistance Bed Mobility: Rolling;Sit to Sidelying Rolling: Modified independent (Device/Increase time)       Sit to sidelying: Modified independent (Device/Increase time) General bed mobility comments: No physical assist.    Transfers Overall transfer level: Needs assistance Equipment used: Rolling walker (2 wheeled) Transfers: Sit to/from Stand Sit to Stand: Min guard         General transfer comment: Min guard for safety. Cues for safe hand placement.     Balance Overall balance assessment: Needs assistance Sitting-balance support: No upper extremity supported Sitting balance-Leahy Scale: Good     Standing balance support: No upper extremity supported;During functional activity Standing  balance-Leahy Scale: Good                             ADL either performed or assessed with clinical judgement   ADL Overall ADL's : Modified independent;At baseline                                       General ADL Comments: Pt able to perform LB dressing, shower transfer simulation, and ambulation for ADL. Pt verbalizing correct technique for bed mobility and ADL functional mobility. Education provided for LB ADL AE as needed.      Vision Baseline Vision/History: No visual deficits Patient Visual Report: No change from baseline Vision Assessment?: No apparent visual deficits     Perception     Praxis      Pertinent Vitals/Pain Pain Assessment: Faces Faces Pain Scale: No hurt Pain Location: back Pain Descriptors / Indicators: Aching;Operative site guarding Pain Intervention(s): Monitored during session     Hand Dominance Right   Extremity/Trunk Assessment Upper Extremity Assessment Upper Extremity Assessment: Overall WFL for tasks assessed   Lower Extremity Assessment Lower Extremity Assessment: Overall WFL for tasks assessed   Cervical / Trunk Assessment Cervical / Trunk Assessment: Other exceptions Cervical / Trunk Exceptions: s/p PLIF    Communication Communication Communication: No difficulties   Cognition Arousal/Alertness: Awake/alert Behavior During Therapy: WFL for tasks assessed/performed Overall Cognitive Status: Within Functional Limits for tasks assessed  General Comments  Pt's daughter present    Exercises     Shoulder Instructions      Home Living Family/patient expects to be discharged to:: Private residence Living Arrangements: Spouse/significant other;Other relatives (granddaughter) Available Help at Discharge: Family Type of Home: House Home Access: Stairs to enter Technical brewer of Steps: 3 Entrance Stairs-Rails: Right Home Layout: One level      Bathroom Shower/Tub: Teacher, early years/pre: Standard     Home Equipment: Bedside commode;Walker - 4 wheels;Shower seat          Prior Functioning/Environment Level of Independence: Independent        Comments: Pt reports having a shower chair, supervision from her granddaughter for transfers and assist for any ADL/IADLs.        OT Problem List: Decreased activity tolerance      OT Treatment/Interventions:      OT Goals(Current goals can be found in the care plan section) Acute Rehab OT Goals Patient Stated Goal: to feel better OT Goal Formulation: With patient  OT Frequency:     Barriers to D/C:            Co-evaluation              AM-PAC OT "6 Clicks" Daily Activity     Outcome Measure Help from another person eating meals?: None Help from another person taking care of personal grooming?: None Help from another person toileting, which includes using toliet, bedpan, or urinal?: None Help from another person bathing (including washing, rinsing, drying)?: A Little Help from another person to put on and taking off regular upper body clothing?: None Help from another person to put on and taking off regular lower body clothing?: None 6 Click Score: 23   End of Session Equipment Utilized During Treatment: Back brace Nurse Communication: Mobility status;Precautions  Activity Tolerance: Patient tolerated treatment well Patient left: in bed;with call bell/phone within reach;with family/visitor present  OT Visit Diagnosis: Muscle weakness (generalized) (M62.81)                Time: 2458-0998 OT Time Calculation (min): 22 min Charges:  OT General Charges $OT Visit: 1 Visit OT Evaluation $OT Eval Moderate Complexity: 1 Mod  Jefferey Pica, OTR/L Acute Rehabilitation Services Pager: [phone] Office: [phone]   Orvie Caradine C 04/26/2020, 9:44 AM

## 2020-05-04 DIAGNOSIS — M059 Rheumatoid arthritis with rheumatoid factor, unspecified: Secondary | ICD-10-CM | POA: Diagnosis not present

## 2020-05-04 DIAGNOSIS — Z79899 Other long term (current) drug therapy: Secondary | ICD-10-CM | POA: Diagnosis not present

## 2020-05-11 DIAGNOSIS — M5116 Intervertebral disc disorders with radiculopathy, lumbar region: Secondary | ICD-10-CM | POA: Diagnosis not present

## 2020-05-16 ENCOUNTER — Encounter: Payer: Self-pay | Admitting: Podiatry

## 2020-05-16 ENCOUNTER — Other Ambulatory Visit: Payer: Self-pay

## 2020-05-16 ENCOUNTER — Ambulatory Visit (INDEPENDENT_AMBULATORY_CARE_PROVIDER_SITE_OTHER): Payer: Medicare Other | Admitting: Podiatry

## 2020-05-16 DIAGNOSIS — L84 Corns and callosities: Secondary | ICD-10-CM | POA: Diagnosis not present

## 2020-05-16 DIAGNOSIS — M79674 Pain in right toe(s): Secondary | ICD-10-CM | POA: Diagnosis not present

## 2020-05-16 DIAGNOSIS — B351 Tinea unguium: Secondary | ICD-10-CM

## 2020-05-16 DIAGNOSIS — M79675 Pain in left toe(s): Secondary | ICD-10-CM | POA: Diagnosis not present

## 2020-05-16 DIAGNOSIS — E119 Type 2 diabetes mellitus without complications: Secondary | ICD-10-CM

## 2020-05-18 NOTE — Progress Notes (Signed)
Subjective: Michelle [name] presents today for follow up of preventative diabetic foot care and painful corn(s) left 3rd digit and painful thick toenails that are difficult to trim. Painful toenails interfere with ambulation. Aggravating factors include wearing enclosed shoe gear. Pain is relieved with periodic professional debridement. Painful corns are aggravated when weightbearing when wearing enclosed shoe gear. Pain is relieved with periodic professional debridement.   She states she recently had back surgery and is recovering from this.  Allergies  Allergen Reactions  . Zofran [Ondansetron Hcl] Other (See Comments)    Prolong QT  . Ace Inhibitors Cough    Occurred with liinopril  . Levofloxacin Other (See Comments)    Felt things on her legs that were not there,made her stomach hurt  . Lisinopril Cough  . Sulfonamide Derivatives Hives and Itching     Objective: There were no vitals filed for this visit.  Pt is a pleasant 69 y.o. year old Caucasian female WD, WN in NAD. AAO x 3.   Vascular Examination:  Capillary refill time to digits immediate b/l. Palpable DP pulses b/l. Palpable PT pulses b/l. Pedal hair sparse b/l. Skin temperature gradient within normal limits b/l. No edema noted b/l.  Dermatological Examination: Pedal skin with normal turgor, texture and tone bilaterally. No open wounds bilaterally. No interdigital macerations bilaterally. Toenails 1-5 b/l elongated, discolored, dystrophic, thickened, crumbly with subungual debris and tenderness to dorsal palpation. Hyperkeratotic lesion(s) L 3rd toe.  No erythema, no edema, no drainage, no flocculence.    Musculoskeletal: Normal muscle strength 5/5 to all lower extremity muscle groups bilaterally, no gross bony deformities bilaterally and no pain crepitus or joint limitation noted with ROM b/l.  Neurological: Protective sensation intact 5/5 intact bilaterally with 10g monofilament b/l. Vibratory sensation intact b/l.  Proprioception intact bilaterally. Babinski reflex negative b/l. Clonus negative b/l.  Assessment: 1. Pain due to onychomycosis of toenails of both feet   2. Corns   3. Diabetes mellitus without complication ([place])    Plan: -Examined patient. -No new findings. No new orders. -Continue diabetic foot care principles. -Toenails 1-5 b/l were debrided in length and girth with sterile nail nippers and dremel without iatrogenic bleeding.  -Corn(s) L 3rd toe pared utilizing sterile scalpel blade without complication or incident. Total number debrided=1. -Patient to report any pedal injuries to medical professional immediately. -Patient/POA to call should there be question/concern in the interim.  Return in about 3 months (around 08/16/2020).

## 2020-05-23 ENCOUNTER — Ambulatory Visit: Payer: Medicare Other | Admitting: Podiatry

## 2020-05-23 ENCOUNTER — Other Ambulatory Visit: Payer: Self-pay

## 2020-05-23 ENCOUNTER — Other Ambulatory Visit: Payer: Self-pay | Admitting: Family Medicine

## 2020-05-23 DIAGNOSIS — E119 Type 2 diabetes mellitus without complications: Secondary | ICD-10-CM

## 2020-05-23 MED ORDER — ONETOUCH VERIO VI STRP: ORAL_STRIP | 12 refills | Status: DC

## 2020-05-23 NOTE — Telephone Encounter (Signed)
Patient calls nurse line stating she still has not been able to pick up her new test strips. I called the pharmacy to see what the issue is and what could be done to resolve. Per pharmacy, Dr. Jeannine Kitten is not authorized to send in Medicare Part B prescriptions. Will resend in under preceptor.

## 2020-05-31 NOTE — Progress Notes (Signed)
    SUBJECTIVE:   CHIEF COMPLAINT / HPI:  lumbar fusion: Patient says "the surgery was worse" because she was told she cannot lift anything or do any bending over or twisting motions.  She would like to know when she can resume normal activities.  She has an appointment coming up with the neurosurgeon.  Her pain is slightly worse than baseline but she is managing with current medication regimen  The patient's fecal incontinence has improved after her surgery.  Patient states she then became constipated but now is having small amounts of loose stools.   DM: Patient has not been checking her blood sugar often because she still has not been able to get the strips from the pharmacy..  She has been compliant with her diabetes medications  PERTINENT  PMH / PSH: Diabetes, lumbar stenosis  OBJECTIVE:   BP 130/76   Pulse 99   Ht 5\' 3"  (1.6 m)   Wt 139 lb 12.8 oz (63.4 kg)   SpO2 98%   BMI 24.76 kg/m   General: Alert and oriented.  No acute distress. CV: Regular rate and rhythm. MSK: Patient has a small 6 inch vertical incision in her lumbosacral region that is well-healed.  It is nontender to palpation.  ASSESSMENT/PLAN:   Lumbar radiculopathy Recent lumbar fusion.  Healing well.  Advised patient to follow-up with her neurosurgeon in regards to when she can resume normal activities.  Type II diabetes mellitus Will resend in prescription for glucose test strips.  Advised patient to call us if she has any difficulty filling this prescription.  Will get A1c at next visit in 3 months.  Continue current treatment.  Bowel and bladder incontinence Incontinence has improved status post surgery.  Appears now she may have some constipation causing leakage of loose stools around the obstruction.  Advised patient to take MiraLAX daily for at least 1 week and as needed after that  Encounter for chronic pain management We will continue current plan for pain control.  Refill narcotics for 3 more  months.  Return visit in 3 months.     [name], MD [place]

## 2020-06-01 ENCOUNTER — Other Ambulatory Visit: Payer: Self-pay

## 2020-06-01 ENCOUNTER — Encounter: Payer: Self-pay | Admitting: Family Medicine

## 2020-06-01 ENCOUNTER — Ambulatory Visit (INDEPENDENT_AMBULATORY_CARE_PROVIDER_SITE_OTHER): Payer: Medicare Other | Admitting: Family Medicine

## 2020-06-01 VITALS — BP 130/76 | HR 99 | Ht 63.0 in | Wt 139.8 lb

## 2020-06-01 DIAGNOSIS — G8929 Other chronic pain: Secondary | ICD-10-CM | POA: Diagnosis not present

## 2020-06-01 DIAGNOSIS — M5416 Radiculopathy, lumbar region: Secondary | ICD-10-CM | POA: Diagnosis not present

## 2020-06-01 DIAGNOSIS — E785 Hyperlipidemia, unspecified: Secondary | ICD-10-CM | POA: Diagnosis not present

## 2020-06-01 DIAGNOSIS — E119 Type 2 diabetes mellitus without complications: Secondary | ICD-10-CM | POA: Diagnosis not present

## 2020-06-01 DIAGNOSIS — R32 Unspecified urinary incontinence: Secondary | ICD-10-CM

## 2020-06-01 DIAGNOSIS — E1142 Type 2 diabetes mellitus with diabetic polyneuropathy: Secondary | ICD-10-CM

## 2020-06-01 DIAGNOSIS — R159 Full incontinence of feces: Secondary | ICD-10-CM | POA: Diagnosis not present

## 2020-06-01 MED ORDER — ONETOUCH VERIO VI STRP: ORAL_STRIP | 12 refills | Status: DC

## 2020-06-01 NOTE — Patient Instructions (Signed)
It was nice to see you again today,  I will refill your pain medication for another 3 months.  I have resent the glucose monitoring strips prescription again.  Please let me know if you have issues filling this.  We will recheck your LDL today.  I will talk to you about the results when I get them.  I would like to see you back in 3 months.  I would like you to try taking a capful of MiraLAX every day with your coffee for least a week.  The diarrhea you are currently having could be a result of underlying constipation.  Have a great day,  Clemetine Marker, MD

## 2020-06-02 LAB — LDL CHOLESTEROL, DIRECT: LDL Direct: 43 mg/dL (ref 0–99)

## 2020-06-02 MED ORDER — OXYCODONE-ACETAMINOPHEN 10-325 MG PO TABS: 1.0000 | ORAL_TABLET | Freq: Four times a day (QID) | ORAL | 0 refills | Status: DC | PRN

## 2020-06-02 MED ORDER — OXYCODONE-ACETAMINOPHEN 10-325 MG PO TABS
1.0000 | ORAL_TABLET | Freq: Four times a day (QID) | ORAL | 0 refills | Status: DC | PRN
Start: 2020-08-18 — End: 2020-09-03

## 2020-06-02 NOTE — Assessment & Plan Note (Signed)
We will continue current plan for pain control.  Refill narcotics for 3 more months.  Return visit in 3 months.

## 2020-06-02 NOTE — Assessment & Plan Note (Signed)
Recent lumbar fusion.  Healing well.  Advised patient to follow-up with her neurosurgeon in regards to when she can resume normal activities.

## 2020-06-02 NOTE — Progress Notes (Signed)
    SUBJECTIVE:   CHIEF COMPLAINT / HPI:  lumbar fusion: Patient says "the surgery was worse" than her previous back surgery because she was told she cannot lift anything or do any bending over or twisting motions.  She would like to know when she can resume normal activities.  She has an appointment coming up with the neurosurgeon.  Her pain is slightly worse than baseline but she is managing with current medication regimen  The patient's fecal incontinence has improved after her surgery.  Patient states she then became constipated but now is having small amounts of loose stools.   DM: Patient has not been checking her blood sugar often because she still has not been able to get the strips from the pharmacy..  She has been compliant with her diabetes medications  PERTINENT  PMH / PSH: Diabetes, lumbar stenosis  OBJECTIVE:   BP 130/76   Pulse 99   Ht 5\' 3"  (1.6 m)   Wt 139 lb 12.8 oz (63.4 kg)   SpO2 98%   BMI 24.76 kg/m   General: Alert and oriented.  No acute distress. CV: Regular rate and rhythm. MSK: Patient has a small 6 inch vertical incision in her lumbosacral region that is well-healed.  It is nontender to palpation.  ASSESSMENT/PLAN:   Lumbar radiculopathy Recent lumbar fusion.  Healing well.  Advised patient to follow-up with her neurosurgeon in regards to when she can resume normal activities.  Type II diabetes mellitus Will resend in prescription for glucose test strips.  Advised patient to call us if she has any difficulty filling this prescription.  Will get A1c at next visit in 3 months.  Continue current treatment.  Bowel and bladder incontinence Incontinence has improved status post surgery.  Appears now she may have some constipation causing leakage of loose stools around the obstruction.  Advised patient to take MiraLAX daily for at least 1 week and as needed after that  Encounter for chronic pain management We will continue current plan for pain control.   Refill narcotics for 3 more months.  Return visit in 3 months.     [name], MD [place]

## 2020-06-02 NOTE — Addendum Note (Signed)
Addended by: [name] on: 06/02/2020 08:36 PM   Modules accepted: Orders

## 2020-06-02 NOTE — Assessment & Plan Note (Signed)
Incontinence has improved status post surgery.  Appears now she may have some constipation causing leakage of loose stools around the obstruction.  Advised patient to take MiraLAX daily for at least 1 week and as needed after that

## 2020-06-02 NOTE — Assessment & Plan Note (Signed)
Will resend in prescription for glucose test strips.  Advised patient to call us if she has any difficulty filling this prescription.  Will get A1c at next visit in 3 months.  Continue current treatment.

## 2020-06-08 ENCOUNTER — Ambulatory Visit (INDEPENDENT_AMBULATORY_CARE_PROVIDER_SITE_OTHER): Payer: Medicare Other | Admitting: Dermatology

## 2020-06-08 ENCOUNTER — Other Ambulatory Visit: Payer: Self-pay

## 2020-06-08 DIAGNOSIS — Z85828 Personal history of other malignant neoplasm of skin: Secondary | ICD-10-CM | POA: Diagnosis not present

## 2020-06-08 DIAGNOSIS — Z1283 Encounter for screening for malignant neoplasm of skin: Secondary | ICD-10-CM

## 2020-06-08 DIAGNOSIS — D229 Melanocytic nevi, unspecified: Secondary | ICD-10-CM | POA: Diagnosis not present

## 2020-06-08 DIAGNOSIS — L821 Other seborrheic keratosis: Secondary | ICD-10-CM

## 2020-06-08 DIAGNOSIS — L578 Other skin changes due to chronic exposure to nonionizing radiation: Secondary | ICD-10-CM

## 2020-06-08 DIAGNOSIS — L409 Psoriasis, unspecified: Secondary | ICD-10-CM

## 2020-06-08 DIAGNOSIS — T148XXA Other injury of unspecified body region, initial encounter: Secondary | ICD-10-CM | POA: Diagnosis not present

## 2020-06-08 DIAGNOSIS — L814 Other melanin hyperpigmentation: Secondary | ICD-10-CM

## 2020-06-08 DIAGNOSIS — D1800 Hemangioma unspecified site: Secondary | ICD-10-CM | POA: Diagnosis not present

## 2020-06-08 MED ORDER — CLOBETASOL PROPIONATE 0.05 % EX SOLN: CUTANEOUS | 2 refills | Status: DC

## 2020-06-08 MED ORDER — MOMETASONE FUROATE 0.1 % EX CREA: 1.0000 "application " | TOPICAL_CREAM | Freq: Every day | CUTANEOUS | 11 refills | Status: DC | PRN

## 2020-06-08 NOTE — Progress Notes (Signed)
   Follow-Up Visit   Subjective  Michelle [name] is a 69 y.o. female who presents for the following: Dermatitis (Pt c/o rash on her scalp for several years using Mometasone solution and Mometasone cream for her face with a poor response /) and Annual Exam (Yearly body check, hx of SCC on her legs ). The patient presents for Total-Body Skin Exam (TBSE) for skin cancer screening and mole check.  The following portions of the chart were reviewed this encounter and updated as appropriate:   Tobacco  Allergies  Meds  Problems  Med Hx  Surg Hx  Fam Hx     Review of Systems:  No other skin or systemic complaints except as noted in HPI or Assessment and Plan.  Objective  Well appearing patient in no apparent distress; mood and affect are within normal limits.  A focused examination was performed including face, legs, scalp . Relevant physical exam findings are noted in the Assessment and Plan.  Objective  multiple see history: Well healed scar with no evidence of recurrence, no lymphadenopathy.   Objective  R nose, upper back: Excoriations   Objective  Head - Anterior (Face): Well-demarcated erythematous papules/plaques with silvery scale, guttate pink scaly papules.    Assessment & Plan   History of SCC (squamous cell carcinoma) of skin multiple see history Clear. Observe for recurrence. Call clinic for new or changing lesions.  Recommend regular skin exams, daily broad-spectrum spf 30+ sunscreen use, and photoprotection.     Excoriation R nose, upper back Observe   Psoriasis Head - Anterior (Face); scalp Psoriasis is a chronic non-curable, but treatable genetic/hereditary disease that may have other systemic features affecting other organ systems such as joints (Psoriatic Arthritis). It is associated with an increased risk of inflammatory bowel disease, heart disease non-alcoholic fatty liver disease, and depression.     Start Clobetasol solution apply to scalp daily up  to 5 days for scalp  Cont Mometasone cream apply to face daily up to 5 days for face   Ordered Medications: clobetasol (TEMOVATE) 0.05 % external solution mometasone (ELOCON) 0.1 % cream  Skin cancer screening  Lentigines - Scattered tan macules - Discussed due to sun exposure - Benign, observe - Call for any changes  Seborrheic Keratoses - Stuck-on, waxy, tan-brown papules and plaques  - Discussed benign etiology and prognosis. - Observe - Call for any changes  Melanocytic Nevi - Tan-brown and/or pink-flesh-colored symmetric macules and papules - Benign appearing on exam today - Observation - Call clinic for new or changing moles - Recommend daily use of broad spectrum spf 30+ sunscreen to sun-exposed areas.   Hemangiomas - Red papules - Discussed benign nature - Observe - Call for any changes  Actinic Damage - Chronic, secondary to cumulative UV/sun exposure - diffuse scaly erythematous macules with underlying dyspigmentation - Recommend daily broad spectrum sunscreen SPF 30+ to sun-exposed areas, reapply every 2 hours as needed.  - Call for new or changing lesions.  Skin cancer screening performed today.  Return in about 6 months (around 12/07/2020).  IMarye Round, CMA, am acting as scribe for Sarina Ser, MD .  Documentation: I have reviewed the above documentation for accuracy and completeness, and I agree with the above.  Sarina Ser, MD

## 2020-06-08 NOTE — Patient Instructions (Signed)
Gentle Skin Care Guide  1. Bathe no more than once a day.  2. Avoid bathing in hot water  3. Use a mild soap like Dove, Vanicream, Cetaphil, CeraVe. Can use Lever 2000 or Cetaphil antibacterial soap  4. Use soap only where you need it. On most days, use it under your arms, between your legs, and on your feet. Let the water rinse other areas unless visibly dirty.  5. When you get out of the bath/shower, use a towel to gently blot your skin dry, don't rub it.  6. While your skin is still a little damp, apply a moisturizing cream such as Vanicream, CeraVe, Cetaphil, Eucerin, Sarna lotion or plain Vaseline Jelly. For hands apply Neutrogena [place] Hand Cream or Excipial Hand Cream.  7. Reapply moisturizer any time you start to itch or feel dry.  8. Sometimes using free and clear laundry detergents can be helpful. Fabric softener sheets should be avoided. Downy Free & Gentle liquid, or any liquid fabric softener that is free of dyes and perfumes, it acceptable to use  9. If your doctor has given you prescription creams you may apply moisturizers over them    Amlactin rapid relief for dry skin

## 2020-06-09 ENCOUNTER — Other Ambulatory Visit: Payer: Self-pay

## 2020-06-09 DIAGNOSIS — E119 Type 2 diabetes mellitus without complications: Secondary | ICD-10-CM

## 2020-06-09 MED ORDER — ONETOUCH VERIO VI STRP: ORAL_STRIP | 12 refills | Status: DC

## 2020-06-09 NOTE — Telephone Encounter (Signed)
Received phone call from patient regarding test strips. Patient is still unable to pick up from pharmacy, as PCP is not covered under Medicare. Called and spoke with pharmacist who confirmed that supervising provider needs to send in rx. Resending rx under Dr. Andria Frames, per pharmacist instructions.   Talbot Grumbling, RN

## 2020-06-13 ENCOUNTER — Telehealth: Payer: Self-pay

## 2020-06-13 DIAGNOSIS — J4540 Moderate persistent asthma, uncomplicated: Secondary | ICD-10-CM | POA: Diagnosis not present

## 2020-06-13 NOTE — Telephone Encounter (Signed)
Clobetasol Solution non formulary for patient. Found [employer]RX coupon for $22 through [employer] but patient would like alternative sent in to pharmacy due to both her Medicare and Medicaid insurance.

## 2020-06-13 NOTE — Telephone Encounter (Signed)
Try Lidex solution or Betamethasone lotion.

## 2020-06-15 ENCOUNTER — Telehealth: Payer: Self-pay

## 2020-06-15 ENCOUNTER — Encounter: Payer: Self-pay | Admitting: Dermatology

## 2020-06-15 ENCOUNTER — Other Ambulatory Visit: Payer: Self-pay | Admitting: Family Medicine

## 2020-06-15 MED ORDER — OXYCODONE-ACETAMINOPHEN 10-325 MG PO TABS: 1.0000 | ORAL_TABLET | Freq: Four times a day (QID) | ORAL | 0 refills | Status: DC | PRN

## 2020-06-15 MED ORDER — BETAMETHASONE DIPROPIONATE 0.05 % EX LOTN: TOPICAL_LOTION | CUTANEOUS | 2 refills | Status: DC

## 2020-06-15 NOTE — Telephone Encounter (Signed)
done

## 2020-06-15 NOTE — Telephone Encounter (Signed)
Patient calls nurse line requesting Oxycodone to be called into pharmacy for this month. Patient reports the only Oxycodone they have on file is for January, February, and March of 2022. Please advise on December.

## 2020-06-15 NOTE — Telephone Encounter (Signed)
Patient advised of medication change.

## 2020-06-28 DIAGNOSIS — Z6825 Body mass index (BMI) 25.0-25.9, adult: Secondary | ICD-10-CM | POA: Diagnosis not present

## 2020-06-28 DIAGNOSIS — I10 Essential (primary) hypertension: Secondary | ICD-10-CM | POA: Diagnosis not present

## 2020-06-28 DIAGNOSIS — M4316 Spondylolisthesis, lumbar region: Secondary | ICD-10-CM | POA: Diagnosis not present

## 2020-06-28 DIAGNOSIS — M5442 Lumbago with sciatica, left side: Secondary | ICD-10-CM | POA: Diagnosis not present

## 2020-06-29 DIAGNOSIS — Z79899 Other long term (current) drug therapy: Secondary | ICD-10-CM | POA: Diagnosis not present

## 2020-06-29 DIAGNOSIS — M059 Rheumatoid arthritis with rheumatoid factor, unspecified: Secondary | ICD-10-CM | POA: Diagnosis not present

## 2020-07-03 DIAGNOSIS — M25532 Pain in left wrist: Secondary | ICD-10-CM | POA: Diagnosis not present

## 2020-07-03 DIAGNOSIS — M25531 Pain in right wrist: Secondary | ICD-10-CM | POA: Diagnosis not present

## 2020-07-13 ENCOUNTER — Other Ambulatory Visit: Payer: Self-pay | Admitting: Internal Medicine

## 2020-07-13 DIAGNOSIS — Z1231 Encounter for screening mammogram for malignant neoplasm of breast: Secondary | ICD-10-CM

## 2020-07-15 ENCOUNTER — Other Ambulatory Visit: Payer: Self-pay | Admitting: Physician Assistant

## 2020-07-15 DIAGNOSIS — M5442 Lumbago with sciatica, left side: Secondary | ICD-10-CM

## 2020-07-20 DIAGNOSIS — Z7984 Long term (current) use of oral hypoglycemic drugs: Secondary | ICD-10-CM | POA: Diagnosis not present

## 2020-07-20 DIAGNOSIS — M7061 Trochanteric bursitis, right hip: Secondary | ICD-10-CM | POA: Diagnosis not present

## 2020-07-20 DIAGNOSIS — F341 Dysthymic disorder: Secondary | ICD-10-CM | POA: Diagnosis not present

## 2020-07-20 DIAGNOSIS — Z882 Allergy status to sulfonamides status: Secondary | ICD-10-CM | POA: Diagnosis not present

## 2020-07-20 DIAGNOSIS — E119 Type 2 diabetes mellitus without complications: Secondary | ICD-10-CM | POA: Diagnosis not present

## 2020-07-20 DIAGNOSIS — M059 Rheumatoid arthritis with rheumatoid factor, unspecified: Secondary | ICD-10-CM | POA: Diagnosis not present

## 2020-07-20 DIAGNOSIS — Z7951 Long term (current) use of inhaled steroids: Secondary | ICD-10-CM | POA: Diagnosis not present

## 2020-07-20 DIAGNOSIS — Z79899 Other long term (current) drug therapy: Secondary | ICD-10-CM | POA: Diagnosis not present

## 2020-07-20 DIAGNOSIS — M7630 Iliotibial band syndrome, unspecified leg: Secondary | ICD-10-CM | POA: Diagnosis not present

## 2020-07-20 DIAGNOSIS — E785 Hyperlipidemia, unspecified: Secondary | ICD-10-CM | POA: Diagnosis not present

## 2020-07-20 DIAGNOSIS — M7062 Trochanteric bursitis, left hip: Secondary | ICD-10-CM | POA: Diagnosis not present

## 2020-07-25 ENCOUNTER — Telehealth: Payer: Self-pay

## 2020-07-25 NOTE — Telephone Encounter (Signed)
Patient calls nurse line regarding possible UTI. Patient reports that she is having increased urinary frequency. However, is not having normal flow of urine. Reports urine hesitancy and "dribbling" of urine. Denies abdominal pain, back pain, dysuria, fever. Reports slight odor to urine.   Patient states this is how she typically feels when she is developing UTI. Patient is requesting abx to be sent into [facility] on [address]. Informed patient that typically appointment is needed to provide urine sample prior to initiation of abx. Patient verbalizes understanding, however is hesitant to come into office due to increased [facility] numbers.   Will forward to PCP  Talbot Grumbling, RN

## 2020-07-25 NOTE — Telephone Encounter (Signed)
She can come in just to drop off a urine sample to minimize her exposure and then can schedule a virtual appointment if she likes.  Her symptoms sound like they could be due to obstruction and not UTI.  I am on vacation until the 31st so the appointment would have to be with someone else.

## 2020-07-26 ENCOUNTER — Other Ambulatory Visit: Payer: Self-pay

## 2020-07-26 ENCOUNTER — Other Ambulatory Visit (INDEPENDENT_AMBULATORY_CARE_PROVIDER_SITE_OTHER): Payer: Medicare Other

## 2020-07-26 ENCOUNTER — Telehealth (INDEPENDENT_AMBULATORY_CARE_PROVIDER_SITE_OTHER): Payer: Medicare Other | Admitting: Family Medicine

## 2020-07-26 DIAGNOSIS — H5211 Myopia, right eye: Secondary | ICD-10-CM | POA: Diagnosis not present

## 2020-07-26 DIAGNOSIS — H40013 Open angle with borderline findings, low risk, bilateral: Secondary | ICD-10-CM | POA: Diagnosis not present

## 2020-07-26 DIAGNOSIS — H1045 Other chronic allergic conjunctivitis: Secondary | ICD-10-CM | POA: Diagnosis not present

## 2020-07-26 DIAGNOSIS — R300 Dysuria: Secondary | ICD-10-CM

## 2020-07-26 DIAGNOSIS — H02831 Dermatochalasis of right upper eyelid: Secondary | ICD-10-CM | POA: Diagnosis not present

## 2020-07-26 DIAGNOSIS — H5202 Hypermetropia, left eye: Secondary | ICD-10-CM | POA: Diagnosis not present

## 2020-07-26 DIAGNOSIS — N3001 Acute cystitis with hematuria: Secondary | ICD-10-CM

## 2020-07-26 DIAGNOSIS — H02834 Dermatochalasis of left upper eyelid: Secondary | ICD-10-CM | POA: Diagnosis not present

## 2020-07-26 DIAGNOSIS — H0102A Squamous blepharitis right eye, upper and lower eyelids: Secondary | ICD-10-CM | POA: Diagnosis not present

## 2020-07-26 DIAGNOSIS — H59021 Cataract (lens) fragments in eye following cataract surgery, right eye: Secondary | ICD-10-CM | POA: Diagnosis not present

## 2020-07-26 DIAGNOSIS — H0102B Squamous blepharitis left eye, upper and lower eyelids: Secondary | ICD-10-CM | POA: Diagnosis not present

## 2020-07-26 DIAGNOSIS — Z961 Presence of intraocular lens: Secondary | ICD-10-CM | POA: Diagnosis not present

## 2020-07-26 DIAGNOSIS — H04123 Dry eye syndrome of bilateral lacrimal glands: Secondary | ICD-10-CM | POA: Diagnosis not present

## 2020-07-26 DIAGNOSIS — E119 Type 2 diabetes mellitus without complications: Secondary | ICD-10-CM | POA: Diagnosis not present

## 2020-07-26 LAB — POCT UA - MICROSCOPIC ONLY: WBC, Ur, HPF, POC: >20

## 2020-07-26 LAB — POCT URINALYSIS DIP (CLINITEK)
Bilirubin, UA: NEGATIVE
Glucose, UA: NEGATIVE mg/dL
Ketones, POC UA: NEGATIVE mg/dL
Nitrite, UA: NEGATIVE
POC PROTEIN,UA: NEGATIVE
Spec Grav, UA: 1.015 (ref 1.010–1.025)
Urobilinogen, UA: 0.2 E.U./dL
pH, UA: 6 (ref 5.0–8.0)

## 2020-07-26 MED ORDER — CEPHALEXIN 500 MG PO CAPS: 500.0000 mg | ORAL_CAPSULE | Freq: Four times a day (QID) | ORAL | 0 refills | Status: AC

## 2020-07-26 NOTE — Assessment & Plan Note (Addendum)
UA with positive leuks and few bacteria, 0-2 RBCs on microscopy.  Given her symptoms and UA we will go ahead and treat her for UTI.  Given her prior susceptibilities we will treat her with Keflex 500 mg 4 times daily for 5 days.  Culture has already been ordered.  She is to follow-up if she does not have improvement, develops fever, flank pain, nausea, vomiting.  Advised that she can try over-the-counter Azo for urinary pain relief for pain.

## 2020-07-26 NOTE — Progress Notes (Signed)
Patient will come in this morning for urinalysis, per Dr. Richardson Landry instructions. Scheduled patient for virtual appointment this afternoon to discuss symptoms and results.   Patient verbalizes understanding of current plan.   Talbot Grumbling, RN

## 2020-07-26 NOTE — Progress Notes (Signed)
[facility] Telemedicine Visit  Patient consented to have virtual visit and was identified by name and date of birth. Method of visit: Telephone  Encounter participants: Patient: Michelle [name] - located at home Provider: Cleophas Dunker - located at [facility] Others (if applicable): none  Chief Complaint: dysuria and increased frequency of urination  HPI:  Michelle [name] is a 70 y.o. female who presents to discuss the following:  Dysuria Started 2 days ago Has increased frequency and urgency as well No hematuria, flank pain, fevers, chills, nausea, vomiting  Last Urine Culture in 2018 with Klebsiella but susceptible to cephalosporins, no allergy to keflex Brought urine sample to clinic today, results below Otherwise feeling well, has chronic back pain, but no change  ROS: per HPI  Pertinent PMHx: HTN, asthma, restrictive lung disease, GERD, T2DM, osteoporosis, GAD  Exam:  There were no vitals taken for this visit.  Respiratory: Speaking in complete sentences, no evidence of respiratory distress over the phone  Results for orders placed or performed in visit on 07/26/20 (from the past 24 hour(s))  POCT UA - Microscopic Only     Status: Abnormal   Collection Time: 07/26/20 11:15 AM  Result Value Ref Range   WBC, Ur, HPF, POC >20    RBC, urine, microscopic 0-2    Bacteria, U Microscopic FEW    Epithelial cells, urine per micros 0-3      Assessment/Plan:  Acute cystitis with hematuria UA with positive leuks and few bacteria, 0-2 RBCs on microscopy.  Given her symptoms and UA we will go ahead and treat her for UTI.  Given her prior susceptibilities we will treat her with Keflex 500 mg 4 times daily for 5 days.  Culture has already been ordered.  She is to follow-up if she does not have improvement, develops fever, flank pain, nausea, vomiting.  Advised that she can try over-the-counter Azo for urinary pain relief for pain.    Time spent during  visit with patient: 8 minutes

## 2020-07-29 LAB — URINE CULTURE

## 2020-07-31 ENCOUNTER — Other Ambulatory Visit: Payer: Self-pay

## 2020-07-31 ENCOUNTER — Ambulatory Visit (INDEPENDENT_AMBULATORY_CARE_PROVIDER_SITE_OTHER): Payer: Medicare Other | Admitting: Neurology

## 2020-07-31 ENCOUNTER — Encounter: Payer: Self-pay | Admitting: Neurology

## 2020-07-31 ENCOUNTER — Ambulatory Visit
Admission: RE | Admit: 2020-07-31 | Discharge: 2020-07-31 | Disposition: A | Discharge status: Home / Self Care | Payer: Medicare Other | Source: Ambulatory Visit | Attending: Physician Assistant | Admitting: Physician Assistant

## 2020-07-31 VITALS — BP 116/66 | HR 85 | Ht 62.0 in | Wt 140.0 lb

## 2020-07-31 DIAGNOSIS — M4807 Spinal stenosis, lumbosacral region: Secondary | ICD-10-CM | POA: Diagnosis not present

## 2020-07-31 DIAGNOSIS — M5126 Other intervertebral disc displacement, lumbar region: Secondary | ICD-10-CM | POA: Diagnosis not present

## 2020-07-31 DIAGNOSIS — M5442 Lumbago with sciatica, left side: Secondary | ICD-10-CM

## 2020-07-31 DIAGNOSIS — R251 Tremor, unspecified: Secondary | ICD-10-CM

## 2020-07-31 DIAGNOSIS — M48061 Spinal stenosis, lumbar region without neurogenic claudication: Secondary | ICD-10-CM | POA: Diagnosis not present

## 2020-07-31 DIAGNOSIS — M47816 Spondylosis without myelopathy or radiculopathy, lumbar region: Secondary | ICD-10-CM | POA: Diagnosis not present

## 2020-07-31 MED ORDER — GADOBENATE DIMEGLUMINE 529 MG/ML IV SOLN
13.0000 mL | Freq: Once | INTRAVENOUS | Status: AC | PRN
  Administered 2020-07-31: 13 mL via INTRAVENOUS

## 2020-07-31 MED ORDER — PRIMIDONE 50 MG PO TABS: ORAL_TABLET | ORAL | 2 refills | Status: DC

## 2020-07-31 NOTE — Progress Notes (Signed)
Assessment/Plan:    1.  Essential Tremor  -Continue primidone, 50 mg, 2 tablets in the morning and 1 tablet in the evening  -This is made worse by medication for her lungs.  2.  B12 deficiency  -On oral supplementation.  When she has stopped her supplement in the past, levels went back down.  3.  Memory loss, by history  -Patient had neurocognitive testing in July, 2021 with Dr. Melvyn Novas.  This was normal from a neurologic standpoint.  Dr. Melvyn Novas did mention that there was some evidence that her difficulties were caused by chronic pain, medication side effects and generalized anxiety.  She will need to follow-up with her primary care in that regard.  Subjective:   Michelle [name] was seen today in follow up for essential tremor and memory change.  My previous records were reviewed prior to todays visit. Tremor is overall doing well - "I wait on my husband" and tremor may be worse when carring the plate to him.  Pt denies falls.  Pt denies lightheadedness, near syncope.  No hallucinations.  Patient had neurocognitive testing with Dr. Melvyn Novas in July.  This was normal.  States that she is having more back pain (had surgery in October) and is having repeat mri today.  She is worried about having another back sx.    Current prescribed movement disorder medications: Primidone, 50 mg, 2 tablets in the morning and 1 tablet in the evening B12 supplement    ALLERGIES:   Allergies  Allergen Reactions  . Zofran [Ondansetron Hcl] Other (See Comments)    Prolong QT  . Ace Inhibitors Cough    Occurred with liinopril  . Levofloxacin Other (See Comments)    Felt things on her legs that were not there,made her stomach hurt  . Lisinopril Cough  . Sulfonamide Derivatives Hives and Itching    CURRENT MEDICATIONS:  Outpatient Encounter Medications as of 07/31/2020  Medication Sig  . acetaminophen (TYLENOL) 500 MG tablet Take 1,000 mg by mouth every 6 (six) hours as needed for moderate pain or  headache.  . betamethasone dipropionate 0.05 % lotion Apply topically as directed. Apply to the scalp daily up to 5 days for scalp  . Blood Glucose Monitoring Suppl (ONETOUCH VERIO) w/Device KIT Check blood glucose once a day  . budesonide-formoterol (SYMBICORT) 160-4.5 MCG/ACT inhaler Inhale 2 puffs into the lungs 2 (two) times daily.  . cephALEXin (KEFLEX) 500 MG capsule Take 1 capsule (500 mg total) by mouth 4 (four) times daily for 5 days.  . cetirizine (EQ ALLERGY RELIEF, CETIRIZINE,) 10 MG tablet Take 1 tablet (10 mg total) by mouth at bedtime.  . chlorhexidine (PERIDEX) 0.12 % solution Use as directed 15 mLs in the mouth or throat 2 (two) times daily. (Patient taking differently: Use as directed 15 mLs in the mouth or throat 2 (two) times daily as needed (mouth sores).)  . clobetasol (TEMOVATE) 0.05 % external solution Apply to scalp daily up to 5 days  . cromolyn (OPTICROM) 4 % ophthalmic solution SMARTSIG:In Eye(s)  . cyclobenzaprine (FLEXERIL) 10 MG tablet Take 10 mg by mouth at bedtime as needed for muscle spasms.   . Dulaglutide (TRULICITY) 1.5 MP/5.3IR SOPN INJECT 1.5 MG INTO THE SKIN ONCE A WEEK (Patient taking differently: Inject 1.5 mg as directed every Wednesday.)  . ferrous sulfate 325 (65 FE) MG tablet Take 1 tablet (325 mg total) by mouth daily with breakfast.  . fluticasone (FLONASE) 50 MCG/ACT nasal spray Place 2 sprays into  both nostrils daily as needed for allergies.  Marland Kitchen gabapentin (NEURONTIN) 300 MG capsule Take 1 capsule (300 mg total) by mouth at bedtime.  Marland Kitchen glucose blood (ONETOUCH VERIO) test strip Use to test blood glucose once daily. Dx Code: e11.9  . hydrOXYzine (ATARAX/VISTARIL) 25 MG tablet Take 25 mg by mouth daily as needed for itching.   . inFLIXimab (REMICADE) 100 MG injection Inject 100 mg into the vein every 7 (seven) weeks.   Marland Kitchen leflunomide (ARAVA) 10 MG tablet Take 10 mg by mouth daily.  Marland Kitchen losartan (COZAAR) 25 MG tablet Take 1/2 (one-half) tablet by mouth once  daily (Patient taking differently: Take 12.5 mg by mouth daily.)  . metFORMIN (GLUCOPHAGE) 1000 MG tablet Take 1 tablet (1,000 mg total) by mouth 2 (two) times daily with a meal.  . mometasone (ELOCON) 0.1 % cream Apply 1 application topically 2 (two) times daily as needed (sores).   . Multiple Vitamin (MULTIVITAMIN WITH MINERALS) TABS tablet Take 1 tablet by mouth daily.  Marland Kitchen MYRBETRIQ 25 MG TB24 tablet Take 25 mg by mouth daily.  . nitroGLYCERIN (NITROSTAT) 0.4 MG SL tablet Place 1 tablet (0.4 mg total) under the tongue every 5 (five) minutes as needed.  Marland Kitchen omeprazole (PRILOSEC) 40 MG capsule Take 1 capsule (40 mg total) by mouth 2 (two) times daily. (Patient taking differently: Take 40 mg by mouth See admin instructions. Take 40 mg daily, may take a second 40 mg dose as needed for heartburn)  . [START ON 08/18/2020] oxyCODONE-acetaminophen (PERCOCET) 10-325 MG tablet Take 1 tablet by mouth every 6 (six) hours as needed for pain.  . Pramoxine HCl 1 % CREA Apply 1 Dose topically as directed.  . primidone (MYSOLINE) 50 MG tablet TAKE 2 TABLETS BY MOUTH ONCE DAILY IN THE MORNING AND 1 IN THE EVENING (Patient taking differently: Take 50-100 mg by mouth See admin instructions. Take 100 mg in the morning and 50 mg in the evening)  . rosuvastatin (CRESTOR) 10 MG tablet Take 1 tablet (10 mg total) by mouth daily. (Patient taking differently: Take 10 mg by mouth at bedtime.)  . sertraline (ZOLOFT) 50 MG tablet Take 1 tablet (50 mg total) by mouth daily. (Patient taking differently: Take 50 mg by mouth at bedtime.)  . VENTOLIN HFA 108 (90 Base) MCG/ACT inhaler INHALE 2 PUFFS BY MOUTH EVERY 4 HOURS AS NEEDED FOR WHEEZING OR SHORTNESS OF BREATH (Patient taking differently: Inhale 2 puffs into the lungs every 4 (four) hours as needed for wheezing or shortness of breath.)  . vitamin B-12 (CYANOCOBALAMIN) 1000 MCG tablet Take 1,000 mcg by mouth daily.  . diclofenac Sodium (VOLTAREN) 1 % GEL Apply 1 application  topically 4 (four) times daily. (Patient not taking: Reported on 07/31/2020)  . [DISCONTINUED] ALREX 0.2 % SUSP Place 1 drop into both eyes 3 (three) times daily.  (Patient not taking: Reported on 07/31/2020)  . [DISCONTINUED] diclofenac sodium (VOLTAREN) 1 % GEL Apply 2 g topically 4 (four) times daily as needed (for pain).  . [DISCONTINUED] Incontinence Supply Disposable (INCONTINENCE BRIEF MEDIUM) MISC Use as needed for incontinence (Patient not taking: Reported on 07/31/2020)  . [DISCONTINUED] mometasone (ELOCON) 0.1 % cream Apply 1 application topically daily as needed (Rash). (Patient not taking: Reported on 07/31/2020)  . [DISCONTINUED] oxyCODONE-acetaminophen (PERCOCET) 10-325 MG tablet Take 1 tablet by mouth every 6 (six) hours as needed for pain. (Patient not taking: Reported on 07/31/2020)  . [DISCONTINUED] oxyCODONE-acetaminophen (PERCOCET) 10-325 MG tablet Take 1 tablet by mouth every 6 (six)  hours as needed for pain.   No facility-administered encounter medications on file as of 07/31/2020.     Objective:    PHYSICAL EXAMINATION:    VITALS:   Vitals:   07/31/20 0843  BP: 116/66  Pulse: 85  SpO2: 98%  Weight: 140 lb (63.5 kg)  Height: 5' 2" (1.575 m)    GEN:  The patient appears stated age and is in NAD. HEENT:  Normocephalic, atraumatic.  The mucous membranes are moist. The superficial temporal arteries are without ropiness or tenderness. CV:  RRR Lungs:  CTAB Neck/HEME:  There are no carotid bruits bilaterally.  Neurological examination:  Orientation: The patient is alert and oriented x3. Cranial nerves: There is good facial symmetry. The speech is fluent and clear. Soft palate rises symmetrically and there is no tongue deviation. Hearing is intact to conversational tone. Sensation: Sensation is intact to light touch throughout Motor: Strength is at least antigravity x4.  Movement examination: Tone: There is normal tone in the UE/LE  Abnormal movements: there is no  rest tremor.  There is mild postural tremor on the L only Coordination:  There is no decremation with RAM's Gait and Station: The patients gait is slow and tenuous I have reviewed and interpreted the following labs independently   Chemistry      Component Value Date/Time   NA 135 04/26/2020 0243   NA 138 02/16/2019 0941   K 4.0 04/26/2020 0243   CL 105 04/26/2020 0243   CO2 22 04/26/2020 0243   BUN 9 04/26/2020 0243   BUN 7 (L) 02/16/2019 0941   CREATININE 0.50 04/26/2020 0243   CREATININE 0.46 (L) 07/22/2016 1026      Component Value Date/Time   CALCIUM 8.5 (L) 04/26/2020 0243   ALKPHOS 107 02/16/2019 0941   AST 35 02/16/2019 0941   ALT 26 02/16/2019 0941   BILITOT 0.4 02/16/2019 0941      Lab Results  Component Value Date   WBC 9.8 04/26/2020   HGB 8.8 (L) 04/26/2020   HCT 28.2 (L) 04/26/2020   MCV 88.1 04/26/2020   PLT 205 04/26/2020   Lab Results  Component Value Date   TSH 1.320 07/13/2019     Chemistry      Component Value Date/Time   NA 135 04/26/2020 0243   NA 138 02/16/2019 0941   K 4.0 04/26/2020 0243   CL 105 04/26/2020 0243   CO2 22 04/26/2020 0243   BUN 9 04/26/2020 0243   BUN 7 (L) 02/16/2019 0941   CREATININE 0.50 04/26/2020 0243   CREATININE 0.46 (L) 07/22/2016 1026      Component Value Date/Time   CALCIUM 8.5 (L) 04/26/2020 0243   ALKPHOS 107 02/16/2019 0941   AST 35 02/16/2019 0941   ALT 26 02/16/2019 0941   BILITOT 0.4 02/16/2019 0941         Total time spent on today's visit was 20 minutes, including both face-to-face time and nonface-to-face time.  Time included that spent on review of records (prior notes available to me/labs/imaging if pertinent), discussing treatment and goals, answering patient's questions and coordinating care.  Cc:  [name], MD

## 2020-07-31 NOTE — Patient Instructions (Signed)
1.  I sent your primidone for one year.  After that year, your pcp can prescribe that for you unless you need something else from [place].  The physicians and staff at [facility] are committed to providing excellent care. You may receive a survey requesting feedback about your experience at our office. We strive to receive "very good" responses to the survey questions. If you feel that your experience would prevent you from giving the office a "very good " response, please contact our office to try to remedy the situation. We may be reached at [phone]. Thank you for taking the time out of your busy day to complete the survey.

## 2020-08-09 DIAGNOSIS — M533 Sacrococcygeal disorders, not elsewhere classified: Secondary | ICD-10-CM | POA: Diagnosis not present

## 2020-08-15 ENCOUNTER — Telehealth: Payer: Self-pay

## 2020-08-15 NOTE — Telephone Encounter (Signed)
Patient calls nurse line regarding prior authorization for Trulicity. Patient reports that new insurance, Medicare Part D ([place]) will require PA for Trulicity.   Called and spoke with pharmacist at [facility]. Reports that patient picked up three month supply of Trulicity on 2/8 for $4. Cancelled additional refills, as future refills will need to be sent to [facility].    Called and spoke with insurance company. For additional refills they will need script [facility] and then have PA completed.   Advised patient to return call to office when she opens last box, so that PA process can be started.   Talbot Grumbling, RN

## 2020-08-18 ENCOUNTER — Telehealth: Payer: Self-pay

## 2020-08-18 NOTE — Telephone Encounter (Signed)
Called patient and informed of below. Advised patient that she could go to UC for evaluation of symptoms, as we will be closed for the weekend. Patient reports that she will call us back on Monday if symptoms have not improved.   Talbot Grumbling, RN

## 2020-08-18 NOTE — Telephone Encounter (Signed)
Patient calls nurse line regarding possible UTI. Patient was treated for UTI on 1/26 with Keflex 500 mg four times daily for five days. Patient is now experiencing increased frequency, hesitancy, and "dribbling of urine". Patient states that symptoms are the same as when she was treated a few weeks ago. Clinic has no available appointments for today. Patient is requesting antibiotic to be sent in without having to come into the office.    Denies fever, abdominal pain, back pain.   Please advise.   Talbot Grumbling, RN

## 2020-08-18 NOTE — Telephone Encounter (Signed)
She'll need to be seen in person for evaluation if she is still having symptoms.

## 2020-08-23 ENCOUNTER — Ambulatory Visit
Admission: RE | Admit: 2020-08-23 | Discharge: 2020-08-23 | Disposition: A | Discharge status: Home / Self Care | Payer: Medicare Other | Source: Ambulatory Visit | Attending: Internal Medicine | Admitting: Internal Medicine

## 2020-08-23 ENCOUNTER — Other Ambulatory Visit: Payer: Self-pay

## 2020-08-23 DIAGNOSIS — Z1231 Encounter for screening mammogram for malignant neoplasm of breast: Secondary | ICD-10-CM

## 2020-08-24 DIAGNOSIS — M059 Rheumatoid arthritis with rheumatoid factor, unspecified: Secondary | ICD-10-CM | POA: Diagnosis not present

## 2020-08-25 ENCOUNTER — Ambulatory Visit: Payer: Medicare Other

## 2020-08-25 ENCOUNTER — Other Ambulatory Visit: Payer: Self-pay | Admitting: Family Medicine

## 2020-08-25 DIAGNOSIS — E119 Type 2 diabetes mellitus without complications: Secondary | ICD-10-CM

## 2020-08-28 DIAGNOSIS — M461 Sacroiliitis, not elsewhere classified: Secondary | ICD-10-CM | POA: Diagnosis not present

## 2020-08-29 ENCOUNTER — Ambulatory Visit (INDEPENDENT_AMBULATORY_CARE_PROVIDER_SITE_OTHER): Payer: Medicare Other | Admitting: Podiatry

## 2020-08-29 ENCOUNTER — Other Ambulatory Visit: Payer: Self-pay

## 2020-08-29 ENCOUNTER — Encounter: Payer: Self-pay | Admitting: Podiatry

## 2020-08-29 DIAGNOSIS — B351 Tinea unguium: Secondary | ICD-10-CM | POA: Diagnosis not present

## 2020-08-29 DIAGNOSIS — M79674 Pain in right toe(s): Secondary | ICD-10-CM

## 2020-08-29 DIAGNOSIS — M79675 Pain in left toe(s): Secondary | ICD-10-CM

## 2020-08-29 DIAGNOSIS — L84 Corns and callosities: Secondary | ICD-10-CM | POA: Diagnosis not present

## 2020-08-29 DIAGNOSIS — M461 Sacroiliitis, not elsewhere classified: Secondary | ICD-10-CM | POA: Insufficient documentation

## 2020-08-29 DIAGNOSIS — E119 Type 2 diabetes mellitus without complications: Secondary | ICD-10-CM

## 2020-08-29 NOTE — Progress Notes (Signed)
Subjective: Michelle [name] presents today for follow up of preventative diabetic foot care and painful corn(s) left 3rd digit and painful thick toenails that are difficult to trim. Painful toenails interfere with ambulation. Aggravating factors include wearing enclosed shoe gear. Pain is relieved with periodic professional debridement. Painful corns are aggravated when weightbearing when wearing enclosed shoe gear. Pain is relieved with periodic professional debridement.   She states did not check her blood glucose this morning. She got two back injections on yesterday and hopes this helps relieve her sciatica.  PCP is Dr. Addison Naegeli. Last visit was 06/01/2021.  Allergies  Allergen Reactions  . Zofran [Ondansetron Hcl] Other (See Comments)    Prolong QT  . Ace Inhibitors Cough    Occurred with liinopril  . Levofloxacin Other (See Comments)    Felt things on her legs that were not there,made her stomach hurt  . Lisinopril Cough  . Sulfonamide Derivatives Hives and Itching     Objective: There were no vitals filed for this visit.  Pt is a pleasant 70 y.o. year old Caucasian female WD, WN in NAD. AAO x 3.   Vascular Examination:  Capillary refill time to digits immediate b/l. Palpable DP pulses b/l. Palpable PT pulses b/l. Pedal hair sparse b/l. Skin temperature gradient within normal limits b/l. No edema noted b/l.  Dermatological Examination: Pedal skin with normal turgor, texture and tone bilaterally. No open wounds bilaterally. No interdigital macerations bilaterally. Toenails 1-5 b/l elongated, discolored, dystrophic, thickened, crumbly with subungual debris and tenderness to dorsal palpation. Hyperkeratotic lesion(s) L 3rd toe.  No erythema, no edema, no drainage, no flocculence.    Musculoskeletal: Normal muscle strength 5/5 to all lower extremity muscle groups bilaterally, no gross bony deformities bilaterally and no pain crepitus or joint limitation noted with ROM  b/l.  Neurological: Protective sensation intact 5/5 intact bilaterally with 10g monofilament b/l. Vibratory sensation intact b/l. Proprioception intact bilaterally. Babinski reflex negative b/l. Clonus negative b/l.  Assessment: 1. Pain due to onychomycosis of toenails of both feet   2. Corns   3. Diabetes mellitus without complication ([place])    Plan: -Examined patient. -No new findings. No new orders. -Continue diabetic foot care principles. -Toenails 1-5 b/l were debrided in length and girth with sterile nail nippers and dremel without iatrogenic bleeding.  -Corn(s) L 3rd toe pared utilizing sterile scalpel blade without complication or incident. Total number debrided=1. -Patient to report any pedal injuries to medical professional immediately. -Patient/POA to call should there be question/concern in the interim.  Return in about 3 months (around 11/29/2020).

## 2020-08-31 ENCOUNTER — Encounter: Payer: Self-pay | Admitting: Family Medicine

## 2020-08-31 ENCOUNTER — Other Ambulatory Visit: Payer: Self-pay

## 2020-08-31 ENCOUNTER — Ambulatory Visit (INDEPENDENT_AMBULATORY_CARE_PROVIDER_SITE_OTHER): Payer: Medicare Other | Admitting: Family Medicine

## 2020-08-31 VITALS — BP 138/78 | HR 98 | Ht 62.0 in | Wt 139.0 lb

## 2020-08-31 DIAGNOSIS — K529 Noninfective gastroenteritis and colitis, unspecified: Secondary | ICD-10-CM

## 2020-08-31 DIAGNOSIS — R32 Unspecified urinary incontinence: Secondary | ICD-10-CM

## 2020-08-31 DIAGNOSIS — R159 Full incontinence of feces: Secondary | ICD-10-CM | POA: Diagnosis not present

## 2020-08-31 DIAGNOSIS — E1142 Type 2 diabetes mellitus with diabetic polyneuropathy: Secondary | ICD-10-CM | POA: Diagnosis not present

## 2020-08-31 DIAGNOSIS — M059 Rheumatoid arthritis with rheumatoid factor, unspecified: Secondary | ICD-10-CM | POA: Diagnosis not present

## 2020-08-31 LAB — POCT GLYCOSYLATED HEMOGLOBIN (HGB A1C): Hemoglobin A1C: 6.9 % — AB (ref 4.0–5.6)

## 2020-08-31 NOTE — Patient Instructions (Signed)
It was nice to see you again today,  I would like you to call your gastroenterologist and try to schedule appointment regarding the diarrhea.  I will get some lab test today as well.  I would like you to also schedule appointment with your urologist regarding your issues with urinary frequency and decreased voiding volume.  Specifically, I like them to assess you for possible need for pessary.  I will refill your medications.  I will see you back in 3 months or sooner if needed.  I will call you with the results when I get them.  Have a great day,  Clemetine Marker, MD

## 2020-08-31 NOTE — Progress Notes (Signed)
    SUBJECTIVE:   CHIEF COMPLAINT / HPI:   Lumbar radiculopathy: patient states she still has back pain.  Thinks the surgery may have helped some.  She received some steroid injections in her back Monday which helped somewhat.   DM: patient is surprised her blood glucose was as good as it was given her recent steroid injections. She has been drinking V8 juice and not drinking sodas or diet sodas.  She has been compliant with her medications.    Diarrhea: pt states she has been having diarrhea sometimes up to 15 times a day.  She has had a BM 4 times already today.  This has been an issue for her the past year.  She doesn't believe it's due to her diabetes medications because hs had been on them both for years without diarrhea.  No blood in her stools.    Bladder incontinence: patient is still having the urge to urinate frequently.  Since her surgery is not having episodes of going to the bathroom without realizing it. In the past she has had a hysterectomy and had her 'bladder tacked up' many years ago.    RA: had infusion last Thursday. She needs her opioid prescriptions for April, may, and June.    PERTINENT  PMH / PSH: RA, DM  OBJECTIVE:   BP 138/78   Pulse 98   Ht 5\' 2"  (1.575 m)   Wt 139 lb (63 kg)   SpO2 94%   BMI 25.42 kg/m   Gen: alert, oriented. No acute distrress.  Cv: RRR. No murmurs.   Pulm: lctab  ASSESSMENT/PLAN:   Type II diabetes mellitus Well controlled.  Continue current medications.    Chronic diarrhea Uncertain cause. Nonbloody.  Initially thought to be incontinence due to her spinal disease.  Not improved s/p surgery.  No red flags.  Recent colonoscopy in 2019 showed diverticulosis but otherwise normal.  Will get celiac panel.  Advised pt to schedule appt with her gastroenterologist for possible IBS.    Bowel and bladder incontinence Also thought to be related to spinal issues. Description now sounds more like bladder obstruction due to organ prolapse.   Appears she has had a procedure for this in the past.  Advised pt to schedule appt with her urologist. May need pessary placement.    Seropositive rheumatoid arthritis Refilled percocet for 3 more months.       [name], MD [place]

## 2020-09-02 LAB — CBC
Hematocrit: 33.3 % — ABNORMAL LOW (ref 34.0–46.6)
Hemoglobin: 10.5 g/dL — ABNORMAL LOW (ref 11.1–15.9)
MCH: 26.9 pg (ref 26.6–33.0)
MCHC: 31.5 g/dL (ref 31.5–35.7)
MCV: 85 fL (ref 79–97)
Platelets: 325 10*3/uL (ref 150–450)
RBC: 3.91 x10E6/uL (ref 3.77–5.28)
RDW: 15.7 % — ABNORMAL HIGH (ref 11.7–15.4)
WBC: 11.2 10*3/uL — ABNORMAL HIGH (ref 3.4–10.8)

## 2020-09-02 LAB — MAGNESIUM: Magnesium: 2 mg/dL (ref 1.6–2.3)

## 2020-09-02 LAB — COMPREHENSIVE METABOLIC PANEL
ALT: 16 IU/L (ref 0–32)
AST: 21 IU/L (ref 0–40)
Albumin/Globulin Ratio: 2.1 (ref 1.2–2.2)
Albumin: 4.6 g/dL (ref 3.8–4.8)
Alkaline Phosphatase: 109 IU/L (ref 44–121)
BUN/Creatinine Ratio: 21 (ref 12–28)
BUN: 15 mg/dL (ref 8–27)
Bilirubin Total: 0.2 mg/dL (ref 0.0–1.2)
CO2: 19 mmol/L — ABNORMAL LOW (ref 20–29)
Calcium: 9.6 mg/dL (ref 8.7–10.3)
Chloride: 101 mmol/L (ref 96–106)
Creatinine, Ser: 0.7 mg/dL (ref 0.57–1.00)
Globulin, Total: 2.2 g/dL (ref 1.5–4.5)
Glucose: 180 mg/dL — ABNORMAL HIGH (ref 65–99)
Potassium: 4.1 mmol/L (ref 3.5–5.2)
Sodium: 138 mmol/L (ref 134–144)
Total Protein: 6.8 g/dL (ref 6.0–8.5)
eGFR: 93 mL/min/{1.73_m2} (ref >59)

## 2020-09-02 LAB — CELIAC DISEASE COMPREHENSIVE PANEL WITH REFLEXES
IgA/Immunoglobulin A, Serum: 402 mg/dL — ABNORMAL HIGH (ref 87–352)
Transglutaminase IgA: <2 U/mL (ref 0–3)

## 2020-09-03 DIAGNOSIS — K529 Noninfective gastroenteritis and colitis, unspecified: Secondary | ICD-10-CM | POA: Insufficient documentation

## 2020-09-03 MED ORDER — OXYCODONE-ACETAMINOPHEN 10-325 MG PO TABS: 1.0000 | ORAL_TABLET | Freq: Four times a day (QID) | ORAL | 0 refills | Status: DC | PRN

## 2020-09-03 NOTE — Assessment & Plan Note (Signed)
Uncertain cause. Nonbloody.  Initially thought to be incontinence due to her spinal disease.  Not improved s/p surgery.  No red flags.  Recent colonoscopy in 2019 showed diverticulosis but otherwise normal.  Will get celiac panel.  Advised pt to schedule appt with her gastroenterologist for possible IBS.

## 2020-09-03 NOTE — Assessment & Plan Note (Signed)
>>  ASSESSMENT AND PLAN FOR CHRONIC DIARRHEA WRITTEN ON 09/03/2020  6:57 PM BY [name], MD  Uncertain cause. Nonbloody.  Initially thought to be incontinence due to her spinal disease.  Not improved s/p surgery.  No red flags.  Recent colonoscopy in 2019 showed diverticulosis but otherwise normal.  Will get celiac panel.  Advised pt to schedule appt with her gastroenterologist for possible IBS.

## 2020-09-03 NOTE — Assessment & Plan Note (Signed)
Also thought to be related to spinal issues. Description now sounds more like bladder obstruction due to organ prolapse.  Appears she has had a procedure for this in the past.  Advised pt to schedule appt with her urologist. May need pessary placement.

## 2020-09-03 NOTE — Assessment & Plan Note (Signed)
Well controlled. Continue current medications  

## 2020-09-03 NOTE — Assessment & Plan Note (Signed)
Refilled percocet for 3 more months.

## 2020-09-04 ENCOUNTER — Telehealth: Payer: Self-pay

## 2020-09-04 NOTE — Telephone Encounter (Signed)
Patient calls nurse line requesting lab results from last week. Patient asks we do not notify her in [place] as she does not know how to use it. Please call patient with results.

## 2020-09-05 NOTE — Telephone Encounter (Signed)
Called pt and discussed her labs with her.  She has an appointment tomorrow with her gastroenterologist.

## 2020-09-06 ENCOUNTER — Ambulatory Visit (INDEPENDENT_AMBULATORY_CARE_PROVIDER_SITE_OTHER): Payer: Medicare Other | Admitting: Gastroenterology

## 2020-09-06 ENCOUNTER — Other Ambulatory Visit: Payer: Medicare Other

## 2020-09-06 ENCOUNTER — Ambulatory Visit: Payer: Medicare Other | Admitting: Neurology

## 2020-09-06 ENCOUNTER — Encounter: Payer: Self-pay | Admitting: Gastroenterology

## 2020-09-06 VITALS — BP 130/70 | HR 76 | Ht 62.0 in | Wt 138.4 lb

## 2020-09-06 DIAGNOSIS — K529 Noninfective gastroenteritis and colitis, unspecified: Secondary | ICD-10-CM

## 2020-09-06 NOTE — Patient Instructions (Signed)
If you are age 71 or older, your body mass index should be between 23-30. Your Body mass index is 25.31 kg/m. If this is out of the aforementioned range listed, please consider follow up with your Primary Care Provider.  Your provider has requested that you go to the basement level for lab work before leaving today. Press "B" on the elevator. The lab is located at the first door on the left as you exit the elevator.  Due to recent changes in healthcare laws, you may see the results of your imaging and laboratory studies on MyChart before your provider has had a chance to review them.  We understand that in some cases there may be results that are confusing or concerning to you. Not all laboratory results come back in the same time frame and the provider may be waiting for multiple results in order to interpret others.  Please give [place] 48 hours in order for your provider to thoroughly review all the results before contacting the office for clarification of your results.   Please purchase the following medications over the counter and take as directed:  START: Imodium (over-the-counter) take 2 pills every morning.  You can take a third pill if needed.   Thank you for entrusting me with your care and choosing [facility].  Dr Ardis Hughs

## 2020-09-06 NOTE — Progress Notes (Signed)
Review of pertinent gastrointestinal problems: 1.  History of adenomatous colon polyp.  Colonoscopy October 2019 single subcentimeter adenoma was removed.  Diverticulosis was noted as well.  2.  EGD October 2019 mild gastritis and small hiatal hernia.  Pathology was negative for H. pylori.   HPI: This is a very pleasant 70 year old woman  She was last here in our office September 2020 and saw Janett Billow.  At that time she was dealing with diarrhea for the past 4 weeks.  She was on several medicines which could cause diarrhea specifically Metformin and Trulicity.  Also she was noted to not have a gallbladder and so Questran 1 packet once daily was started.  She was instructed to call back to give an update on her symptoms at about 4-week interval.  We have not heard from her since   Lab work 2022 hemoglobin A1c 6.9, CBC showed white count 11,000, hemoglobin 10.5 with normal MCV, "celiac disease comprehensive panel with reflexes" appears completely normal with included TTG and total IgA level  She is on Remicade for rheumatoid arthritis  She is on chronic narcotic pain medicines.  Her weight is down 6 pounds since her last office visit here about a year and a half ago in 2020  She is still very bothered by watery diarrhea.  She tells me she has 10-15 loose bowel movements daily.  Never bloody.  She does not have any significant abdominal pains.  She takes Pepto-Bismol periodically without any improvement.  She has never tried Imodium.    She either did not try the cholestyramine or it did not work.  She is not sure which  ROS: complete GI ROS as described in HPI, all other review negative.  Constitutional:  No unintentional weight loss   Past Medical History:  Diagnosis Date  . Actinic keratosis   . Allergic rhinitis 08/28/2006  . Anemia    low iron  . Asthma, chronic 03/14/2010   Does seem to use her albuterol excessively. Consider change to more manageable medicines and treat more  like COPD in the future.  . Carpal tunnel syndrome, left 05/29/2009   Last Assessment & Plan:  Advised to wear wrist brace at night when she is sleeping.  If this does not help will consider having her go to sports medicine.  . Chronic frontal sinusitis 08/28/2016  . Chronic left maxillary sinusitis 08/28/2016  . Diverticulosis   . Essential hypertension 10/30/2012  . Gastroparesis 11/19/2011   DM and chronic narcotics contribute.  Causes dysphagia like symptoms   . Generalized anxiety disorder 08/28/2006  . GERD (gastroesophageal reflux disease)   . Hearing loss 07/19/2015   Hearing aid in right ear  . History of cataracts, bilateral   . Hyperlipidemia 08/28/2006  . Lumbar radiculopathy 04/07/2018  . Lung nodules   . Osteoporosis 08/08/2011  . Pain in joint, pelvic region and thigh 11/18/2012  . Radial styloid tenosynovitis 08/15/2013  . Restless leg syndrome 05/14/2012  . Seropositive rheumatoid arthritis 01/08/2010   Patient is being followed by rheumatologist at [facility] patient is on Plaquenil and as well as prednisone.   Changed to methotrexate and prednisone in May of 2013  Patient does have a pain contract here with [facility] receives oxycodone 10/325 mg tabs every 6 hours. This has been titrated down from significant doses of methadone previously. Could attempt to tit  . Shoulder joint pain 03/25/2011   The patient is compensating due to her not being able to  use her left arm right now rotator cuff impingement if he continues to worsen would not be surprised if she does get a tear on this side as well  . Squamous cell carcinoma in situ of skin 11/25/2014   Referred to dermatology for excision   . Squamous cell carcinoma of skin 12/26/2014   Right distal pretibial. SCC-KA pattern.  . Squamous cell carcinoma of skin 03/27/2015   Right inf. lat. knee. SCC-KA pattern.   . Squamous cell carcinoma of skin 10/07/2016   Left lateral calf superior. WD SCC.   Marland Kitchen Squamous cell carcinoma of skin 10/07/2016   Left lateral calf inferior. WD SCC.  Marland Kitchen Squamous cell carcinoma of skin 04/28/2018   Left mid med. pretibial. WD SCC.  Marland Kitchen Squamous cell carcinoma of skin 08/12/2019   Left mid med. pretibial sup. KA type. EDC  . Squamous cell carcinoma of skin 08/12/2019   Left mid med. pretibial inferior. WD. EDC.  . Tear of medial meniscus of knee, left 04/27/2009  . Type II diabetes mellitus 08/28/2006   Overview:  Patient has her ups and downs, go in line with flares of her RA and prednisone uses.  Patient does have hx of hypoglycemic events so would not try for perfect control but should have A1c goal around 7.0 Lab Results  Component Value Date   HGBA1C 6.9 12/27/2011   Last Assessment & Plan:  At goal. Will continue current metformin    Past Surgical History:  Procedure Laterality Date  . ABDOMINAL HYSTERECTOMY    . APPENDECTOMY    . CARPAL TUNNEL RELEASE Right   . CATARACT EXTRACTION Left    with lid lift  . CHOLECYSTECTOMY    . COLONOSCOPY    . HERNIA REPAIR    . KNEE SURGERY Right    x2- cartilage annd scar tissue  . LEFT AND RIGHT HEART CATHETERIZATION WITH CORONARY ANGIOGRAM N/A 08/17/2013   Procedure: LEFT AND RIGHT HEART CATHETERIZATION WITH CORONARY ANGIOGRAM;  Surgeon: Laverda Page, MD;  Location: [facility] CATH LAB;  Service: Cardiovascular;  Laterality: N/A;  . lumbar back surgery  04/04/2018   done by neurosurgeron  . right foot bone spurs removed    . ROTATOR CUFF REPAIR Right   . SHOULDER SURGERY Left    x2- rotatar cuff tear  . UPPER GASTROINTESTINAL ENDOSCOPY      Current Outpatient Medications  Medication Sig Dispense Refill  . acetaminophen (TYLENOL) 500 MG tablet Take 1,000 mg by mouth every 6 (six) hours as needed for moderate pain or headache.    . betamethasone dipropionate 0.05 % lotion Apply topically as directed. Apply to the scalp daily up to 5 days for scalp 60 mL 2  . Blood Glucose Monitoring Suppl (ONETOUCH VERIO)  w/Device KIT Check blood glucose once a day 1 kit 0  . budesonide-formoterol (SYMBICORT) 160-4.5 MCG/ACT inhaler Inhale 2 puffs into the lungs 2 (two) times daily.    . cetirizine (EQ ALLERGY RELIEF, CETIRIZINE,) 10 MG tablet Take 1 tablet (10 mg total) by mouth at bedtime. 90 tablet 3  . chlorhexidine (PERIDEX) 0.12 % solution Use as directed 15 mLs in the mouth or throat 2 (two) times daily. (Patient taking differently: Use as directed 15 mLs in the mouth or throat 2 (two) times daily as needed (mouth sores).) 1893 mL 3  . clobetasol (TEMOVATE) 0.05 % external solution Apply to scalp daily up to 5 days 50 mL 2  . cromolyn (OPTICROM) 4 % ophthalmic solution SMARTSIG:In Eye(s)    .  cyclobenzaprine (FLEXERIL) 10 MG tablet Take 10 mg by mouth at bedtime as needed for muscle spasms.     . diclofenac Sodium (VOLTAREN) 1 % GEL Apply 1 application topically 4 (four) times daily.    . Dulaglutide (TRULICITY) 1.5 FG/1.8EX SOPN INJECT 1.5 MG INTO THE SKIN ONCE A WEEK (Patient taking differently: Inject 1.5 mg as directed every Wednesday.) 4 mL 11  . ferrous sulfate 325 (65 FE) MG tablet Take 1 tablet (325 mg total) by mouth daily with breakfast.    . fluticasone (FLONASE) 50 MCG/ACT nasal spray Place 2 sprays into both nostrils daily as needed for allergies. 48 g 3  . gabapentin (NEURONTIN) 300 MG capsule TAKE 1 CAPSULE BY MOUTH EVERYDAY AT BEDTIME 90 capsule 3  . glucose blood (ONETOUCH VERIO) test strip Use to test blood glucose once daily. Dx Code: e11.9 100 each 12  . hydrOXYzine (ATARAX/VISTARIL) 25 MG tablet Take 25 mg by mouth daily as needed for itching.     . inFLIXimab (REMICADE) 100 MG injection Inject 100 mg into the vein every 7 (seven) weeks.     Marland Kitchen leflunomide (ARAVA) 10 MG tablet Take 10 mg by mouth daily.    Marland Kitchen losartan (COZAAR) 25 MG tablet TAKE 1/2 TABLET BY MOUTH ONCE DAILY 45 tablet 3  . metFORMIN (GLUCOPHAGE) 1000 MG tablet TAKE 1 TABLET (1,000 MG TOTAL) BY MOUTH 2 (TWO) TIMES DAILY WITH  A MEAL. 180 tablet 3  . mometasone (ELOCON) 0.1 % cream Apply 1 application topically 2 (two) times daily as needed (sores).     . montelukast (SINGULAIR) 10 MG tablet     . Multiple Vitamin (MULTIVITAMIN WITH MINERALS) TABS tablet Take 1 tablet by mouth daily.    Marland Kitchen MYRBETRIQ 25 MG TB24 tablet Take 25 mg by mouth daily.    . nitroGLYCERIN (NITROSTAT) 0.4 MG SL tablet Place 1 tablet (0.4 mg total) under the tongue every 5 (five) minutes as needed. 30 tablet 0  . omeprazole (PRILOSEC) 40 MG capsule Take 1 capsule (40 mg total) by mouth 2 (two) times daily. 180 capsule 3  . [START ON 10/14/2020] oxyCODONE-acetaminophen (PERCOCET) 10-325 MG tablet Take 1 tablet by mouth every 6 (six) hours as needed for pain. 120 tablet 0  . Pramoxine HCl 1 % CREA Apply 1 Dose topically as directed. 340 g 1  . primidone (MYSOLINE) 50 MG tablet TAKE 2 TABLETS BY MOUTH ONCE DAILY IN THE MORNING AND 1 IN THE EVENING 270 tablet 2  . rosuvastatin (CRESTOR) 10 MG tablet Take 1 tablet (10 mg total) by mouth daily. 90 tablet 3  . sertraline (ZOLOFT) 50 MG tablet Take 1 tablet (50 mg total) by mouth daily. 90 tablet 3  . VENTOLIN HFA 108 (90 Base) MCG/ACT inhaler INHALE 2 PUFFS BY MOUTH EVERY 4 HOURS AS NEEDED FOR WHEEZING OR SHORTNESS OF BREATH 108 g 11  . vitamin B-12 (CYANOCOBALAMIN) 1000 MCG tablet Take 1,000 mcg by mouth daily.    Derrill Memo ON 11/13/2020] oxyCODONE-acetaminophen (PERCOCET) 10-325 MG tablet Take 1 tablet by mouth every 6 (six) hours as needed for pain. (Patient not taking: Reported on 09/06/2020) 120 tablet 0  . [START ON 12/14/2020] oxyCODONE-acetaminophen (PERCOCET) 10-325 MG tablet Take 1 tablet by mouth every 6 (six) hours as needed for pain. (Patient not taking: Reported on 09/06/2020) 120 tablet 0   No current facility-administered medications for this visit.    Allergies as of 09/06/2020 - Review Complete 09/06/2020  Allergen Reaction Noted  . Zofran Alvis Lemmings  hcl] Other (See Comments) 04/02/2018  .  Ace inhibitors Cough 05/31/2014  . Levofloxacin Other (See Comments) 09/04/2016  . Lisinopril Cough 04/06/2018  . Sulfonamide derivatives Hives and Itching     Family History  Problem Relation Age of Onset  . Stroke Father 84  . Colon cancer Maternal Aunt   . Tremor Mother   . Lung cancer Brother 71  . Rheum arthritis Sister 66  . Colon cancer Daughter   . COPD Sister   . Cancer Sister        in nose  . Diabetes Daughter   . Hypertension Daughter   . Healthy Daughter   . Bell's palsy Maternal Grandmother   . Esophageal cancer Neg Hx   . Rectal cancer Neg Hx   . Stomach cancer Neg Hx     Social History   Socioeconomic History  . Marital status: Married    Spouse name: Programme researcher, broadcasting/film/video  . Number of children: 3  . Years of education: 9  . Highest education level: 9th grade  Occupational History  . Occupation: Retired/disabled    Fish farm manager: UNEMPLOYED  Tobacco Use  . Smoking status: Former Smoker    Packs/day: 1.50    Years: 5.00    Pack years: 7.50    Types: Cigarettes    Quit date: 05/31/1994    Years since quitting: 26.2  . Smokeless tobacco: Never Used  Vaping Use  . Vaping Use: Never used  Substance and Sexual Activity  . Alcohol use: No    Alcohol/week: 0.0 standard drinks  . Drug use: No  . Sexual activity: Not on file  Other Topics Concern  . Not on file  Social History Narrative   Pt is on [employer] program [place]   Lives with husband and granddgt.   Lots of family stressors including special-needs grandaughter who lives with them.      Advance Directives: None - Patient's plan is that her husband will make decisions for her if she were not capable of making informed decisions for herself. (Discussion with Sherren Mocha McDiarmid, MD 12/11/18)   Full Code: Desires CPR/ACLS  (Discussion with Sherren Mocha McDiarmid, MD 12/11/18)         Social Determinants of Health   Financial Resource Strain: Not on file  Food Insecurity: Not on file  Transportation Needs: Not on file   Physical Activity: Not on file  Stress: Not on file  Social Connections: Not on file  Intimate Partner Violence: Not on file     Physical Exam: BP 130/70 (BP Location: Left Arm, Patient Position: Sitting, Cuff Size: Normal)   Pulse 76   Ht 5' 2"  (1.575 m)   Wt 138 lb 6 oz (62.8 kg)   BMI 25.31 kg/m  Constitutional: generally well-appearing Psychiatric: alert and oriented x3 Abdomen: soft, nontender, nondistended, no obvious ascites, no peritoneal signs, normal bowel sounds No peripheral edema noted in lower extremities  Assessment and plan: 70 y.o. female with chronic diarrhea  Some of her meds certainly may be contributing.  She has been on Metformin for many years.  Her current dose is 1 g twice daily which can certainly contribute to diarrhea.  She is also on Trulicity which was started 2 or 3 years ago and a second most common side effect of this is diarrhea.  Certainly this could be medicine related.  I recommended work-up with GI pathogen panel, fecal elastase, fecal leukocytes.  I also recommended that she start taking 2 Imodium pills shortly after waking  every morning and then again a single pill later in the day if needed.  She understands that if the scheduled Imodium is not helpful and if her stool testing is also not helpful then she might need further testing with a repeat colonoscopy, I have particular concern for microscopic colitis.  Please see the "Patient Instructions" section for addition details about the plan.  Owens Loffler, MD [facility] 09/06/2020, 10:01 AM   Total time on date of encounter was 35 minutes (this included time spent preparing to see the patient reviewing records; obtaining and/or reviewing separately obtained history; performing a medically appropriate exam and/or evaluation; counseling and educating the patient and family if present; ordering medications, tests or procedures if applicable; and documenting clinical information in  the health record).

## 2020-09-07 DIAGNOSIS — K529 Noninfective gastroenteritis and colitis, unspecified: Secondary | ICD-10-CM | POA: Diagnosis not present

## 2020-09-09 LAB — GI PROFILE, STOOL, PCR
Adenovirus F 40/41: NOT DETECTED
Astrovirus: NOT DETECTED
C difficile toxin A/B: NOT DETECTED
Campylobacter: DETECTED — AB
Cryptosporidium: NOT DETECTED
Cyclospora cayetanensis: NOT DETECTED
Entamoeba histolytica: NOT DETECTED
Enteroaggregative E coli: NOT DETECTED
Enteropathogenic E coli: NOT DETECTED
Enterotoxigenic E coli: NOT DETECTED
Giardia lamblia: NOT DETECTED
Norovirus GI/GII: NOT DETECTED
Plesiomonas shigelloides: NOT DETECTED
Rotavirus A: NOT DETECTED
Salmonella: NOT DETECTED
Sapovirus: DETECTED — AB
Shiga-toxin-producing E coli: NOT DETECTED
Shigella/Enteroinvasive E coli: NOT DETECTED
Vibrio cholerae: NOT DETECTED
Vibrio: NOT DETECTED
Yersinia enterocolitica: NOT DETECTED

## 2020-09-13 ENCOUNTER — Other Ambulatory Visit: Payer: Self-pay

## 2020-09-13 MED ORDER — AZITHROMYCIN 500 MG PO TABS: 500.0000 mg | ORAL_TABLET | Freq: Every day | ORAL | 0 refills | Status: AC

## 2020-09-14 ENCOUNTER — Other Ambulatory Visit: Payer: Self-pay | Admitting: Family Medicine

## 2020-09-14 ENCOUNTER — Telehealth: Payer: Self-pay | Admitting: Gastroenterology

## 2020-09-14 MED ORDER — FLUCONAZOLE 100 MG PO TABS: 100.0000 mg | ORAL_TABLET | Freq: Once | ORAL | 0 refills | Status: AC

## 2020-09-14 NOTE — Telephone Encounter (Signed)
Patient called and stated she was given an antibiotic and she is requesting a medication to help with yeast infection.

## 2020-09-14 NOTE — Telephone Encounter (Signed)
I sent a prescription for a single tablet of fluconazole to take if she develops a vaginal yeast infection.  - HD

## 2020-09-14 NOTE — Telephone Encounter (Signed)
Thank you I will notify the pt.

## 2020-09-14 NOTE — Telephone Encounter (Signed)
Dr Loletha Carrow you helped me with this patient yesterday. She is calling asking for diflucan due to yeast infection. She has a history of yeast while taking antibiotics.

## 2020-09-16 LAB — PANCREATIC ELASTASE, FECAL: Pancreatic Elastase-1, Stool: >500 mcg/g

## 2020-09-16 LAB — FECAL LACTOFERRIN, QUANT
Fecal Lactoferrin: NEGATIVE
MICRO NUMBER:: 11632204
SPECIMEN QUALITY:: ADEQUATE

## 2020-09-18 ENCOUNTER — Other Ambulatory Visit: Payer: Self-pay | Admitting: Family Medicine

## 2020-09-21 DIAGNOSIS — M461 Sacroiliitis, not elsewhere classified: Secondary | ICD-10-CM | POA: Diagnosis not present

## 2020-09-21 DIAGNOSIS — Z981 Arthrodesis status: Secondary | ICD-10-CM | POA: Diagnosis not present

## 2020-09-21 DIAGNOSIS — M47816 Spondylosis without myelopathy or radiculopathy, lumbar region: Secondary | ICD-10-CM | POA: Diagnosis not present

## 2020-10-10 ENCOUNTER — Other Ambulatory Visit: Payer: Self-pay

## 2020-10-10 ENCOUNTER — Encounter: Payer: Self-pay | Admitting: Pharmacist

## 2020-10-10 ENCOUNTER — Ambulatory Visit (INDEPENDENT_AMBULATORY_CARE_PROVIDER_SITE_OTHER): Payer: Medicare Other | Admitting: Pharmacist

## 2020-10-10 DIAGNOSIS — G2581 Restless legs syndrome: Secondary | ICD-10-CM

## 2020-10-10 DIAGNOSIS — E1142 Type 2 diabetes mellitus with diabetic polyneuropathy: Secondary | ICD-10-CM | POA: Diagnosis not present

## 2020-10-10 DIAGNOSIS — I10 Essential (primary) hypertension: Secondary | ICD-10-CM

## 2020-10-10 MED ORDER — NITROGLYCERIN 0.4 MG SL SUBL: 0.4000 mg | SUBLINGUAL_TABLET | SUBLINGUAL | 1 refills | Status: DC | PRN

## 2020-10-10 MED ORDER — FERROUS SULFATE 325 (65 FE) MG PO TABS: 325.0000 mg | ORAL_TABLET | Freq: Every day | ORAL | 3 refills | Status: DC

## 2020-10-10 NOTE — Progress Notes (Signed)
Reviewed: Agree with Dr. Koval's documentation and management. 

## 2020-10-10 NOTE — Assessment & Plan Note (Signed)
Hypertension longstanding currently controlled.  Blood pressure goal <130/80 mmHg. Medication adherence is good. Patient has not complaints regarding medication therapy and does not endorse signs and symptoms of hypotension.    - Continue losartan 25 MG once daily

## 2020-10-10 NOTE — Progress Notes (Signed)
S:     Chief Complaint  Patient presents with  . Medication Management    Medication management for type 2 diabetes mellitus    Patient arrives today in a pleasant mood.  Presents for diabetes evaluation, education, and management. Patient's most pertinent complaint is continued GI distress and diarrhea. Patient admits that this has been a consistent problem since she had back surgery in Fall 2021.  Patient was referred and last seen by Primary Care Provider on 09/03/2020.   Patient reports Diabetes was diagnosed in 2008.   Family/Social History: Endorses that she stopped smoking >20 years ago and does not currently use tobacco products.   Insurance coverage/medication affordability: Has recently switched Medicare insurance coverage which has affected which medications are covered. Patient is on a fixed income and affordability of medications is a priority.   Medication adherence reported good .   Current diabetes medications include: dulaglutide 1.5 MG weekly (Wed), metformin 1000 MG twice daily  Current hypertension medications include: losartan 25 MG daily Current hyperlipidemia medications include: rosuvastatin 10 MG daily   Patient denies hypoglycemic events.  Patient-reported exercise habits: Does physical work with her husband, no planned excercise   Patient denies nocturia (nighttime urination).  Patient denies neuropathy (nerve pain). Patient denies visual changes. Patient reports self foot exams.     O:  Physical Exam Constitutional:      Appearance: Normal appearance.  Pulmonary:     Effort: Pulmonary effort is normal.  Neurological:     Mental Status: She is alert.  Psychiatric:        Mood and Affect: Mood normal.        Behavior: Behavior normal.        Judgment: Judgment normal.      Review of Systems  Gastrointestinal: Positive for diarrhea.  Genitourinary: Positive for frequency.  Neurological: Positive for tremors. Negative for dizziness and  headaches.     Lab Results  Component Value Date   HGBA1C 6.9 (A) 08/31/2020   There were no vitals filed for this visit.  Lipid Panel     Component Value Date/Time   CHOL 100 03/05/2019 1512   TRIG 140 03/05/2019 1512   HDL 36 (L) 03/05/2019 1512   CHOLHDL 2.8 03/05/2019 1512   CHOLHDL 2.4 07/22/2016 1026   VLDL 18 07/22/2016 1026   LDLCALC 40 03/05/2019 1512   LDLDIRECT 43 06/01/2020 0912   LDLDIRECT  08/14/2009 1225    58 (NOTE) ATP III Classification (LDL):      < 100        mg/dL         Optimal     100 - 129     mg/dL         Near or Above Optimal     130 - 159     mg/dL         Borderline High     160 - 189     mg/dL         High      > 190        mg/dL         Very  High     Home fasting blood sugars: Patient does not like to check blood sugars at home  Clinical Atherosclerotic Cardiovascular Disease (ASCVD): No   A/P: Diabetes longstanding currently controlled. Patient is able to verbalize appropriate hypoglycemia management plan. Medication adherence appears good. Of note, patient has lost approximately 40 pounds from max weight.  Patient's most recent A1c was 6.9 on 08/31/2020. Patient endorses GI upset, but does not attribute this to starting metformin or dulaglutide.  -Extensively discussed pathophysiology of diabetes, recommended lifestyle interventions, dietary effects on blood sugar control -Counseled on s/sx of and management of hypoglycemia -Continue dulaglutide 1.5 MG once weekly  -Reduce or stop metformin 1000 MG twice daily for potential improvement in GI side effects - Begin checking at home fasting blood glucose level when possible to monitor blood sugars while stopping metformin  -Next A1C anticipated 11/2020   ASCVD risk - primary prevention in patient with diabetes. Last LDL is controlled. Patient is currently on a moderate intensity statin  -Continued rosuvastatin 10 mg.   Hypertension longstanding currently controlled.  Blood pressure goal <130/80  mmHg. Medication adherence is good. Patient has not complaints regarding medication therapy and does not endorse signs and symptoms of hypotension.    - Continue losartan 25 MG once daily  - Refilled nitroglycerin prescription as patient's current bottles had expired  Refilled Iron Sulfate - patient request.  This may assist with chronic diarrhea and her history or anemia.  Consider reevaluation of CBC and iron studies at upcoming PCP visit.   Written patient instructions provided.  Total time in face to face counseling 35 minutes.   Follow up PCP Clinic Visit when possible.   Patient seen with Cephus Slater, PharmD, Vernie Ammons, PharmD Candidate

## 2020-10-10 NOTE — Patient Instructions (Addendum)
Please stop taking metformin twice daily to see if this helps with your stomach issues. Continue all other medications. Today we also sent new prescriptions to your preferred pharmacy for new bottles of nitroglycerin and an iron supplement tablet. Please start taking your iron supplement one tablet daily. Remember to replace your current bottles of nitroglycerin with the new prescriptions picked up from your pharmacy.

## 2020-10-10 NOTE — Assessment & Plan Note (Signed)
Diabetes longstanding currently controlled. Patient is able to verbalize appropriate hypoglycemia management plan. Medication adherence appears good. Of note, patient has lost approximately 40 pounds from max weight. Patient's most recent A1c was 6.9 on 08/31/2020. Patient endorses GI upset, but does not attribute this to starting metformin or dulaglutide.  -Extensively discussed pathophysiology of diabetes, recommended lifestyle interventions, dietary effects on blood sugar control -Counseled on s/sx of and management of hypoglycemia -Continue dulaglutide 1.5 MG once weekly  -Reduce or stop metformin 1000 MG twice daily for potential improvement in GI side effects - Begin checking at home fasting blood glucose level when possible to monitor blood sugars while stopping metformin

## 2020-10-12 ENCOUNTER — Encounter: Payer: Self-pay | Admitting: Family Medicine

## 2020-10-12 ENCOUNTER — Other Ambulatory Visit: Payer: Self-pay

## 2020-10-12 ENCOUNTER — Ambulatory Visit (INDEPENDENT_AMBULATORY_CARE_PROVIDER_SITE_OTHER): Payer: Medicare Other | Admitting: Family Medicine

## 2020-10-12 VITALS — BP 144/72 | HR 101 | Ht 62.0 in | Wt 142.0 lb

## 2020-10-12 DIAGNOSIS — R21 Rash and other nonspecific skin eruption: Secondary | ICD-10-CM | POA: Diagnosis not present

## 2020-10-12 MED ORDER — SODIUM CHLORIDE 0.9 % IV SOLN: 62.5000 mg | Freq: Once | INTRAVENOUS | Status: DC

## 2020-10-12 MED ORDER — METHYLPREDNISOLONE SODIUM SUCC 125 MG IJ SOLR
60.0000 mg | Freq: Once | INTRAMUSCULAR | Status: AC
  Administered 2020-10-12: 60 mg via INTRAMUSCULAR

## 2020-10-12 MED ORDER — FLUCONAZOLE 150 MG PO TABS: 150.0000 mg | ORAL_TABLET | Freq: Once | ORAL | 0 refills | Status: AC

## 2020-10-12 MED ORDER — PREDNISONE 20 MG PO TABS: 40.0000 mg | ORAL_TABLET | Freq: Every day | ORAL | 0 refills | Status: DC

## 2020-10-12 NOTE — Progress Notes (Signed)
    SUBJECTIVE:   CHIEF COMPLAINT / HPI:   Facial rash  Patient reports that she has noticed itching around her mouth for approximately 5 days with a red rash which was initially thought to be caused by her mask but the rashes continued to spread throughout her whole face.  Patient reports that she has not changed any shampoos, tried any new detergents, soaps.  She reports that the rash initially started around her mouth and now described all over her face but nowhere else in her body.  She did have a cold sore for the last 2 weeks which has now resolved.  She has never experienced anything like this in the past.  She is having difficulty not scratching.  She is taking Zyrtec daily as well as hydroxyzine at night to help with the itching.  Denies any shortness of breath, changes in vision, chest pain.  OBJECTIVE:   BP (!) 144/72   Pulse (!) 101   Ht 5\' 2"  (1.575 m)   Wt 142 lb (64.4 kg)   SpO2 97%   BMI 25.97 kg/m   General: Well-appearing 70 year old female HEENT: Patient has marked erythema covering her entire face.  No erythema nasal turbinates, no rhinorrhea, no lymphadenopathy or swelling in her neck Cardiac: Regular rate and rhythm, no murmurs appreciated Respiratory: Normal work of breathing, lungs clear to auscultation bilaterally      ASSESSMENT/PLAN:   Facial rash Patient with 5-day history of itchy, painful rash covering her entire face.  None problems with vision, breathing.  Physical exam is consistent with some type of contact dermatitis/allergic reaction but unclear etiology of what is causing this reaction.  Patient is going to continue taking her Zyrtec daily.  We have also given her an injection of Solu-Medrol as well as a prescription of 40 mg prednisone which she will start tomorrow and take for 5 days.  She has scheduled appointment for follow-up in 5 days for further evaluation/to ensure resolution.  Strict ED and return precautions given and the patient is  agreeable to this.     Gifford Shave, MD [facility]

## 2020-10-12 NOTE — Patient Instructions (Signed)
It was a pleasure seeing you today.  I am sorry you are having this facial rash.  We gave you a steroid dose injection today and I would like for you to start taking prednisone 40 mg daily for 5 days starting tomorrow.  I also sent a prescription for Diflucan in case you get a yeast infection from the steroids.  You have been scheduled for a follow-up visit next Thursday at 850 in the morning.  If your symptoms worsen or tenderness in the dermis of breath, swelling in your throat please seek medical attention immediately.  If you have any questions or concerns please feel free to call the clinic.  I hope you have a wonderful afternoon!

## 2020-10-12 NOTE — Assessment & Plan Note (Signed)
Patient with 5-day history of itchy, painful rash covering her entire face.  None problems with vision, breathing.  Physical exam is consistent with some type of contact dermatitis/allergic reaction but unclear etiology of what is causing this reaction.  Patient is going to continue taking her Zyrtec daily.  We have also given her an injection of Solu-Medrol as well as a prescription of 40 mg prednisone which she will start tomorrow and take for 5 days.  She has scheduled appointment for follow-up in 5 days for further evaluation/to ensure resolution.  Strict ED and return precautions given and the patient is agreeable to this.

## 2020-10-13 DIAGNOSIS — M059 Rheumatoid arthritis with rheumatoid factor, unspecified: Secondary | ICD-10-CM | POA: Diagnosis not present

## 2020-10-19 ENCOUNTER — Encounter: Payer: Self-pay | Admitting: Family Medicine

## 2020-10-19 ENCOUNTER — Ambulatory Visit (INDEPENDENT_AMBULATORY_CARE_PROVIDER_SITE_OTHER): Payer: Medicare Other

## 2020-10-19 ENCOUNTER — Telehealth: Payer: Self-pay | Admitting: *Deleted

## 2020-10-19 ENCOUNTER — Ambulatory Visit (INDEPENDENT_AMBULATORY_CARE_PROVIDER_SITE_OTHER): Payer: Medicare Other | Admitting: Family Medicine

## 2020-10-19 ENCOUNTER — Other Ambulatory Visit: Payer: Self-pay

## 2020-10-19 VITALS — BP 128/60 | HR 92 | Ht 62.0 in | Wt 139.4 lb

## 2020-10-19 DIAGNOSIS — N3941 Urge incontinence: Secondary | ICD-10-CM

## 2020-10-19 DIAGNOSIS — E1142 Type 2 diabetes mellitus with diabetic polyneuropathy: Secondary | ICD-10-CM | POA: Diagnosis not present

## 2020-10-19 DIAGNOSIS — N3001 Acute cystitis with hematuria: Secondary | ICD-10-CM | POA: Diagnosis not present

## 2020-10-19 DIAGNOSIS — Z23 Encounter for immunization: Secondary | ICD-10-CM

## 2020-10-19 DIAGNOSIS — R21 Rash and other nonspecific skin eruption: Secondary | ICD-10-CM

## 2020-10-19 DIAGNOSIS — R300 Dysuria: Secondary | ICD-10-CM

## 2020-10-19 DIAGNOSIS — E119 Type 2 diabetes mellitus without complications: Secondary | ICD-10-CM

## 2020-10-19 LAB — POCT URINALYSIS DIP (MANUAL ENTRY)
Bilirubin, UA: NEGATIVE
Blood, UA: NEGATIVE
Glucose, UA: NEGATIVE mg/dL
Ketones, POC UA: NEGATIVE mg/dL
Nitrite, UA: NEGATIVE
Protein Ur, POC: NEGATIVE mg/dL
Spec Grav, UA: 1.025 (ref 1.010–1.025)
Urobilinogen, UA: 0.2 E.U./dL
pH, UA: 5.5 (ref 5.0–8.0)

## 2020-10-19 LAB — POCT UA - MICROSCOPIC ONLY

## 2020-10-19 MED ORDER — ESTROGENS, CONJUGATED 0.625 MG/GM VA CREA: 1.0000 | TOPICAL_CREAM | Freq: Every day | VAGINAL | 12 refills | Status: DC

## 2020-10-19 MED ORDER — FLUCONAZOLE 150 MG PO TABS: 150.0000 mg | ORAL_TABLET | Freq: Once | ORAL | 0 refills | Status: AC

## 2020-10-19 MED ORDER — CEPHALEXIN 500 MG PO CAPS: 500.0000 mg | ORAL_CAPSULE | Freq: Four times a day (QID) | ORAL | 0 refills | Status: AC

## 2020-10-19 NOTE — Assessment & Plan Note (Signed)
Improving.  Avoid high potency steroid creams on face.  Recommended OTC hydrocortisone.

## 2020-10-19 NOTE — Assessment & Plan Note (Signed)
Keflex and culture.

## 2020-10-19 NOTE — Progress Notes (Signed)
    SUBJECTIVE:   CHIEF COMPLAINT / HPI:   Several issues 1. FU facial rash.  Improving after oral pred.  Wonders what she can use long term. 2. Dry sensitive skin.  Asks what she can use regularly.   3. Frequent UTIs and frequency now.  This is a mixed bag picture since also has urge incontinence.  And she gets yeast infections after she is treated with antibiotics.  No fever or flank pain 4. Wants COVID booster today.    OBJECTIVE:   BP 128/60   Pulse 92   Ht 5\' 2"  (1.575 m)   Wt 139 lb 6.4 oz (63.2 kg)   SpO2 97%   BMI 25.50 kg/m   Lungs clear Cardiac RRR without m or g Abd No CVA tenderness  ASSESSMENT/PLAN:   Acute cystitis with hematuria Urine suspicious for UTI.  Will culture and treat with Keflex.  Also given diflucan.    Facial rash Improving.  Avoid high potency steroid creams on face.  Recommended OTC hydrocortisone.    Encounter for immunization Covid booster today.  On remicaid so she is immunocompromised.  UTI (urinary tract infection) Keflex and culture.  Urge incontinence of urine May be playing a role.  No change in myrbetric.  Add vaginal estrogens.     Zenia Resides, MD [place]

## 2020-10-19 NOTE — Patient Instructions (Addendum)
Please call back with the [facility] information.  We will be happy to refill your Trulicity for 3 months through them.   For your face rash, please try over the counter hydrocortisone ointment.  You can use that safely on your face. A very good moisturizing lotion is Eurcerin.  Use twice a day.  Definitely after a bath or shower.   You have room to go up on your Myrbtric.  You could go from 25 to 50 mg.   I sent in a prescription for the estrogen cream.  Start by using it every for a month to see if it helps.  If not, we will stop the medicine.  If it does help, back off to three times per week.

## 2020-10-19 NOTE — Assessment & Plan Note (Addendum)
Covid booster today.  On remicaid so she is immunocompromised.

## 2020-10-19 NOTE — Telephone Encounter (Signed)
Opened in error. Marland Kitchenemmd

## 2020-10-19 NOTE — Assessment & Plan Note (Signed)
Urine suspicious for UTI.  Will culture and treat with Keflex.  Also given diflucan.

## 2020-10-19 NOTE — Assessment & Plan Note (Signed)
May be playing a role.  No change in myrbetric.  Add vaginal estrogens.

## 2020-10-21 LAB — URINE CULTURE

## 2020-10-24 ENCOUNTER — Other Ambulatory Visit: Payer: Self-pay

## 2020-10-24 DIAGNOSIS — E119 Type 2 diabetes mellitus without complications: Secondary | ICD-10-CM

## 2020-10-25 MED ORDER — TRULICITY 1.5 MG/0.5ML ~~LOC~~ SOAJ: SUBCUTANEOUS | 4 refills | Status: DC

## 2020-10-28 ENCOUNTER — Other Ambulatory Visit: Payer: Self-pay | Admitting: Family Medicine

## 2020-10-30 ENCOUNTER — Telehealth: Payer: Self-pay

## 2020-10-30 NOTE — Telephone Encounter (Signed)
Patient called nurse line stating she still has not received Trulicity from mail order pharmacy. Shortly after taking call I received an approval letter from insurance good through 10/27/2021. [facility] and verified they have all they need to process prescription. Per representative the shipment should go out tomorrow.

## 2020-10-31 ENCOUNTER — Other Ambulatory Visit: Payer: Self-pay | Admitting: Family Medicine

## 2020-11-01 ENCOUNTER — Other Ambulatory Visit: Payer: Self-pay | Admitting: Family Medicine

## 2020-11-01 MED ORDER — DULERA 100-5 MCG/ACT IN AERO: 2.0000 | INHALATION_SPRAY | Freq: Two times a day (BID) | RESPIRATORY_TRACT | 11 refills | Status: DC

## 2020-11-01 NOTE — Progress Notes (Signed)
symbicort 160/4.5 not covered by her insurance. Refilled dulera to similar dosage.

## 2020-11-13 ENCOUNTER — Encounter: Payer: Self-pay | Admitting: Gastroenterology

## 2020-11-13 ENCOUNTER — Ambulatory Visit (INDEPENDENT_AMBULATORY_CARE_PROVIDER_SITE_OTHER): Payer: Medicare Other | Admitting: Gastroenterology

## 2020-11-13 VITALS — BP 124/60 | HR 100 | Ht 62.0 in | Wt 140.5 lb

## 2020-11-13 DIAGNOSIS — R197 Diarrhea, unspecified: Secondary | ICD-10-CM

## 2020-11-13 NOTE — Progress Notes (Signed)
Review of pertinent gastrointestinal problems: 1.  History of adenomatous colon polyp.  Colonoscopy October 2019 single subcentimeter adenoma was removed.  Diverticulosis was noted as well. 2.  EGD October 2019 mild gastritis and small hiatal hernia.  Pathology was negative for H. Pylori. 3.  Chronic diarrhea, many of her medicines probably play a role including metformin, Trulicity, arava.  Blood work 2022 celiac sprue serologies negative.  GI pathogen panel 2022 was positive for Campylobacter, treatment with a azithromycin 1 week did not help.   HPI: This is a very pleasant 70 year old woman  I last saw her about 2 months ago.  We discussed her diarrhea again.  She either did not try the cholestyramine which was recommended by extender at her previous office visit or it did not work.  She is not really sure which.  At her office visit she was telling me she had 10-15 loose bowel movements daily, never bloody.  I explained to her at her office visit that some of her medicines could definitely cause or contribute to her diarrhea.  We proceeded with GI pathogen panel, fecal lactase fecal leukocytes.  Pancreatic elastase was normal GI pathogen panel detected Campylobacter in her stool she was given azithromycin 7-day course.  She completed the antibiotic course and noticed no improvement in her chronic diarrhea.  Her weight is up 2 pounds since her last office visit here 2 months ago  She intermittently has dark-colored stools.  She has been on iron for at least a month  ROS: complete GI ROS as described in HPI, all other review negative.  Constitutional:  No unintentional weight loss   Past Medical History:  Diagnosis Date  . Actinic keratosis   . Allergic rhinitis 08/28/2006  . Anemia    low iron  . Asthma, chronic 03/14/2010   Does seem to use her albuterol excessively. Consider change to more manageable medicines and treat more like COPD in the future.  . Carpal tunnel syndrome, left  05/29/2009   Last Assessment & Plan:  Advised to wear wrist brace at night when she is sleeping.  If this does not help will consider having her go to sports medicine.  . Chronic frontal sinusitis 08/28/2016  . Chronic left maxillary sinusitis 08/28/2016  . Diverticulosis   . Essential hypertension 10/30/2012  . Gastroparesis 11/19/2011   DM and chronic narcotics contribute.  Causes dysphagia like symptoms   . Generalized anxiety disorder 08/28/2006  . GERD (gastroesophageal reflux disease)   . Hearing loss 07/19/2015   Hearing aid in right ear  . History of cataracts, bilateral   . Hyperlipidemia 08/28/2006  . Lumbar radiculopathy 04/07/2018  . Lung nodules   . Osteoporosis 08/08/2011  . Pain in joint, pelvic region and thigh 11/18/2012  . Radial styloid tenosynovitis 08/15/2013  . Restless leg syndrome 05/14/2012  . Seropositive rheumatoid arthritis 01/08/2010   Patient is being followed by rheumatologist at [facility] patient is on Plaquenil and as well as prednisone.   Changed to methotrexate and prednisone in May of 2013  Patient does have a pain contract here with [facility] receives oxycodone 10/325 mg tabs every 6 hours. This has been titrated down from significant doses of methadone previously. Could attempt to tit  . Shoulder joint pain 03/25/2011   The patient is compensating due to her not being able to use her left arm right now rotator cuff impingement if he continues to worsen would not be surprised if she does get  a tear on this side as well  . Squamous cell carcinoma in situ of skin 11/25/2014   Referred to dermatology for excision   . Squamous cell carcinoma of skin 12/26/2014   Right distal pretibial. SCC-KA pattern.  . Squamous cell carcinoma of skin 03/27/2015   Right inf. lat. knee. SCC-KA pattern.   . Squamous cell carcinoma of skin 10/07/2016   Left lateral calf superior. WD SCC.  Marland Kitchen Squamous cell carcinoma of skin 10/07/2016   Left  lateral calf inferior. WD SCC.  Marland Kitchen Squamous cell carcinoma of skin 04/28/2018   Left mid med. pretibial. WD SCC.  Marland Kitchen Squamous cell carcinoma of skin 08/12/2019   Left mid med. pretibial sup. KA type. EDC  . Squamous cell carcinoma of skin 08/12/2019   Left mid med. pretibial inferior. WD. EDC.  . Tear of medial meniscus of knee, left 04/27/2009  . Type II diabetes mellitus 08/28/2006   Overview:  Patient has her ups and downs, go in line with flares of her RA and prednisone uses.  Patient does have hx of hypoglycemic events so would not try for perfect control but should have A1c goal around 7.0 Lab Results  Component Value Date   HGBA1C 6.9 12/27/2011   Last Assessment & Plan:  At goal. Will continue current metformin    Past Surgical History:  Procedure Laterality Date  . ABDOMINAL HYSTERECTOMY    . APPENDECTOMY    . CARPAL TUNNEL RELEASE Right   . CATARACT EXTRACTION Left    with lid lift  . CHOLECYSTECTOMY    . COLONOSCOPY    . HERNIA REPAIR    . KNEE SURGERY Right    x2- cartilage annd scar tissue  . LEFT AND RIGHT HEART CATHETERIZATION WITH CORONARY ANGIOGRAM N/A 08/17/2013   Procedure: LEFT AND RIGHT HEART CATHETERIZATION WITH CORONARY ANGIOGRAM;  Surgeon: Laverda Page, MD;  Location: [facility] CATH LAB;  Service: Cardiovascular;  Laterality: N/A;  . lumbar back surgery  04/04/2018   done by neurosurgeron  . right foot bone spurs removed    . ROTATOR CUFF REPAIR Right   . SHOULDER SURGERY Left    x2- rotatar cuff tear  . UPPER GASTROINTESTINAL ENDOSCOPY      Current Outpatient Medications  Medication Sig Dispense Refill  . acetaminophen (TYLENOL) 500 MG tablet Take 1,000 mg by mouth every 6 (six) hours as needed for moderate pain or headache.    . Ascorbic Acid (VITAMIN C) 1000 MG tablet Take 1,000 mg by mouth daily.    . betamethasone dipropionate 0.05 % lotion Apply topically as directed. Apply to the scalp daily up to 5 days for scalp 60 mL 2  . Blood Glucose Monitoring  Suppl (ONETOUCH VERIO) w/Device KIT Check blood glucose once a day 1 kit 0  . cetirizine (EQ ALLERGY RELIEF, CETIRIZINE,) 10 MG tablet Take 1 tablet (10 mg total) by mouth at bedtime. 90 tablet 3  . chlorhexidine (PERIDEX) 0.12 % solution Use as directed 15 mLs in the mouth or throat 2 (two) times daily. (Patient taking differently: Use as directed 15 mLs in the mouth or throat 2 (two) times daily as needed (mouth sores).) 1893 mL 3  . clobetasol (TEMOVATE) 0.05 % external solution Apply to scalp daily up to 5 days 50 mL 2  . conjugated estrogens (PREMARIN) vaginal cream Place 1 Applicatorful vaginally daily. 42.5 g 12  . cromolyn (OPTICROM) 4 % ophthalmic solution SMARTSIG:In Eye(s)    . cyclobenzaprine (FLEXERIL) 10 MG tablet Take  10 mg by mouth at bedtime as needed for muscle spasms.     . diclofenac Sodium (VOLTAREN) 1 % GEL Apply 1 application topically 4 (four) times daily.    . Dulaglutide (TRULICITY) 1.5 MG/0.5ML SOPN INJECT 1.5 MG INTO THE SKIN ONCE A WEEK 6 mL 4  . ferrous sulfate 325 (65 FE) MG tablet Take 1 tablet (325 mg total) by mouth daily with breakfast. 90 tablet 3  . fluticasone (FLONASE) 50 MCG/ACT nasal spray PLACE 2 SPRAYS INTO BOTH NOSTRILS DAILY AS NEEDED FOR ALLERGIES. 144 mL 1  . gabapentin (NEURONTIN) 300 MG capsule TAKE 1 CAPSULE BY MOUTH EVERYDAY AT BEDTIME 90 capsule 3  . glucose blood (ONETOUCH VERIO) test strip Use to test blood glucose once daily. Dx Code: e11.9 100 each 12  . hydrOXYzine (ATARAX/VISTARIL) 25 MG tablet Take 25 mg by mouth daily as needed for itching.     . inFLIXimab (REMICADE) 100 MG injection Inject 100 mg into the vein every 7 (seven) weeks.     . leflunomide (ARAVA) 10 MG tablet Take 10 mg by mouth daily.    . losartan (COZAAR) 25 MG tablet TAKE 1/2 TABLET BY MOUTH ONCE DAILY 45 tablet 3  . metFORMIN (GLUCOPHAGE) 1000 MG tablet TAKE 1 TABLET (1,000 MG TOTAL) BY MOUTH 2 (TWO) TIMES DAILY WITH A MEAL. 180 tablet 3  . mometasone (ELOCON) 0.1 %  cream Apply 1 application topically 2 (two) times daily as needed (sores).     . mometasone-formoterol (DULERA) 100-5 MCG/ACT AERO Inhale 2 puffs into the lungs 2 (two) times daily. 1 each 11  . montelukast (SINGULAIR) 10 MG tablet     . Multiple Vitamin (MULTIVITAMIN WITH MINERALS) TABS tablet Take 1 tablet by mouth daily.    . MYRBETRIQ 25 MG TB24 tablet Take 25 mg by mouth daily.    . omeprazole (PRILOSEC) 40 MG capsule TAKE 1 CAPSULE BY MOUTH TWICE A DAY 180 capsule 3  . oxyCODONE-acetaminophen (PERCOCET) 10-325 MG tablet Take 1 tablet by mouth every 6 (six) hours as needed for pain. 120 tablet 0  . Pramoxine HCl 1 % CREA Apply 1 Dose topically as directed. 340 g 1  . primidone (MYSOLINE) 50 MG tablet TAKE 2 TABLETS BY MOUTH ONCE DAILY IN THE MORNING AND 1 IN THE EVENING 270 tablet 2  . rosuvastatin (CRESTOR) 10 MG tablet TAKE 1 TABLET BY MOUTH EVERY DAY 90 tablet 3  . sertraline (ZOLOFT) 50 MG tablet TAKE 1 TABLET BY MOUTH EVERY DAY 90 tablet 3  . VENTOLIN HFA 108 (90 Base) MCG/ACT inhaler INHALE 2 PUFFS BY MOUTH EVERY 4 HOURS AS NEEDED FOR WHEEZING OR SHORTNESS OF BREATH 108 g 11  . vitamin B-12 (CYANOCOBALAMIN) 1000 MCG tablet Take 1,000 mcg by mouth daily.    . nitroGLYCERIN (NITROSTAT) 0.4 MG SL tablet Place 1 tablet (0.4 mg total) under the tongue every 5 (five) minutes as needed. (Patient not taking: Reported on 11/13/2020) 25 tablet 1  . [START ON 12/14/2020] oxyCODONE-acetaminophen (PERCOCET) 10-325 MG tablet Take 1 tablet by mouth every 6 (six) hours as needed for pain. (Patient not taking: No sig reported) 120 tablet 0   No current facility-administered medications for this visit.    Allergies as of 11/13/2020 - Review Complete 11/13/2020  Allergen Reaction Noted  . Zofran [ondansetron hcl] Other (See Comments) 04/02/2018  . Ace inhibitors Cough 05/31/2014  . Levofloxacin Other (See Comments) 09/04/2016  . Lisinopril Cough 04/06/2018  . Sulfonamide derivatives Hives and Itching        Family History  Problem Relation Age of Onset  . Stroke Father 62  . Colon cancer Maternal Aunt   . Tremor Mother   . Lung cancer Brother 6  . Rheum arthritis Sister 96  . Colon cancer Daughter   . COPD Sister   . Cancer Sister        in nose  . Diabetes Daughter   . Hypertension Daughter   . Healthy Daughter   . Bell's palsy Maternal Grandmother   . Esophageal cancer Neg Hx   . Rectal cancer Neg Hx   . Stomach cancer Neg Hx     Social History   Socioeconomic History  . Marital status: Married    Spouse name: Programme researcher, broadcasting/film/video  . Number of children: 3  . Years of education: 9  . Highest education level: 9th grade  Occupational History  . Occupation: Retired/disabled    Fish farm manager: UNEMPLOYED  Tobacco Use  . Smoking status: Former Smoker    Packs/day: 1.50    Years: 5.00    Pack years: 7.50    Types: Cigarettes    Quit date: 05/31/1994    Years since quitting: 26.4  . Smokeless tobacco: Never Used  Vaping Use  . Vaping Use: Never used  Substance and Sexual Activity  . Alcohol use: No    Alcohol/week: 0.0 standard drinks  . Drug use: No  . Sexual activity: Not on file  Other Topics Concern  . Not on file  Social History Narrative   Pt is on [employer] program [place]   Lives with husband and granddgt.   Lots of family stressors including special-needs grandaughter who lives with them.      Advance Directives: None - Patient's plan is that her husband will make decisions for her if she were not capable of making informed decisions for herself. (Discussion with Sherren Mocha McDiarmid, MD 12/11/18)   Full Code: Desires CPR/ACLS  (Discussion with Sherren Mocha McDiarmid, MD 12/11/18)         Social Determinants of Health   Financial Resource Strain: Not on file  Food Insecurity: Not on file  Transportation Needs: Not on file  Physical Activity: Not on file  Stress: Not on file  Social Connections: Not on file  Intimate Partner Violence: Not on file     Physical Exam: Ht 5' 2"  (1.575 m) Comment: heigth measured without shoes  Wt 140 lb 8 oz (63.7 kg)   BMI 25.70 kg/m  Constitutional: generally well-appearing Psychiatric: alert and oriented x3 Abdomen: soft, nontender, nondistended, no obvious ascites, no peritoneal signs, normal bowel sounds No peripheral edema noted in lower extremities  Assessment and plan: 70 y.o. female with chronic diarrhea, family history of colon cancer  She is on 3 medicines which are well known to cause diarrhea.  Arava, Glucophage, Trulicity all list diarrhea as either #1 or #2 side effects.  It seems like it would actually be a bit surprising if she did not have chronic loose stools in the setting.  She does have a family history of colon cancer but her last colonoscopy was 2-1/2 years ago and I do not think that needs to be repeated just yet.  I recommended she get serious about taking Imodium on a daily scheduled basis starting with 1 pill and doubling it if no improvement after 2 or 3 weeks.  She will return to see me in 2 months.  If she is still bothered and has had no improvement with Imodium then we could consider  further testing with repeat colonoscopy.  Please see the "Patient Instructions" section for addition details about the plan.  Owens Loffler, MD [facility] 11/13/2020, 9:12 AM   Total time on date of encounter was 35 minutes (this included time spent preparing to see the patient reviewing records; obtaining and/or reviewing separately obtained history; performing a medically appropriate exam and/or evaluation; counseling and educating the patient and family if present; ordering medications, tests or procedures if applicable; and documenting clinical information in the health record).

## 2020-11-13 NOTE — Patient Instructions (Signed)
If you are age 71 or older, your body mass index should be between 23-30. Your Body mass index is 25.7 kg/m. If this is out of the aforementioned range listed, please consider follow up with your Primary Care Provider.  Take 1 over the counter Imodium shortly after waking up in the morning. If you do not have any improvement after a few weeks increase to 2 tablets.  You are currently on 3 different medications that are known to cause diarrhea.  Follow up as needed.  Thank you for entrusting me with your care and choosing [facility].  Dr Ardis Hughs

## 2020-11-21 DIAGNOSIS — M153 Secondary multiple arthritis: Secondary | ICD-10-CM | POA: Diagnosis not present

## 2020-11-21 DIAGNOSIS — Z5181 Encounter for therapeutic drug level monitoring: Secondary | ICD-10-CM | POA: Diagnosis not present

## 2020-11-21 DIAGNOSIS — Z79899 Other long term (current) drug therapy: Secondary | ICD-10-CM | POA: Diagnosis not present

## 2020-11-21 DIAGNOSIS — M059 Rheumatoid arthritis with rheumatoid factor, unspecified: Secondary | ICD-10-CM | POA: Diagnosis not present

## 2020-11-28 DIAGNOSIS — M461 Sacroiliitis, not elsewhere classified: Secondary | ICD-10-CM | POA: Diagnosis not present

## 2020-12-05 NOTE — Progress Notes (Signed)
    SUBJECTIVE:   CHIEF COMPLAINT / HPI:   DM:  Patient stopped her metformin for 'a few days' before restarting it due to high blood glucose levels.  Still has some loose stools but taking immodium every day has helped.    Fatigue: Patient said she feels tired during the day.  Goes to bed at 10pm, wakes up at 6am.  Will wake up during the night to urinate.  Patient will then nap again until 8am.  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  Has black stools but takes iron supplements. Recently saw a gastroenterologist.   Chronic pain: patient needs her prescriptions for July, august, and September.  Has not run out of medications early or taken more than prescribed.    PERTINENT  PMH / PSH: chronic back pain, DM  OBJECTIVE:   BP 137/73   Pulse 83   Ht 5\' 2"  (1.575 m)   Wt 138 lb 6.4 oz (62.8 kg)   SpO2 95%   BMI 25.31 kg/m   Gen: alert, oriented. No acute distress.  Cv: rrr Pulm: lctab  ASSESSMENT/PLAN:   Type II diabetes mellitus Stopped her metformin temporarily but restarted it voluntarily.  a1c today is consistent with previous values.  Well controlled.  Will increase trulicity dose and reduce metformin to once a day. Recheck in 3 months.  Foot exam normal today.   Fatigue Likely age related and worsened by nighttime awakenings.  Will get cbc and tsh.    Seropositive rheumatoid arthritis No issues with current medications. Will refill percocet for 3 months.      [name], MD [place]

## 2020-12-06 ENCOUNTER — Other Ambulatory Visit: Payer: Self-pay

## 2020-12-06 ENCOUNTER — Encounter: Payer: Self-pay | Admitting: Family Medicine

## 2020-12-06 ENCOUNTER — Ambulatory Visit (INDEPENDENT_AMBULATORY_CARE_PROVIDER_SITE_OTHER): Payer: Medicare Other | Admitting: Family Medicine

## 2020-12-06 VITALS — BP 137/73 | HR 83 | Ht 62.0 in | Wt 138.4 lb

## 2020-12-06 DIAGNOSIS — R5383 Other fatigue: Secondary | ICD-10-CM

## 2020-12-06 DIAGNOSIS — M059 Rheumatoid arthritis with rheumatoid factor, unspecified: Secondary | ICD-10-CM

## 2020-12-06 DIAGNOSIS — E119 Type 2 diabetes mellitus without complications: Secondary | ICD-10-CM

## 2020-12-06 DIAGNOSIS — E1142 Type 2 diabetes mellitus with diabetic polyneuropathy: Secondary | ICD-10-CM | POA: Diagnosis not present

## 2020-12-06 LAB — POCT GLYCOSYLATED HEMOGLOBIN (HGB A1C): HbA1c, POC (controlled diabetic range): 7.1 % — AB (ref 0.0–7.0)

## 2020-12-06 MED ORDER — ONETOUCH VERIO VI STRP: ORAL_STRIP | 12 refills | Status: DC

## 2020-12-06 NOTE — Patient Instructions (Signed)
It was nice to see you today,  I am increasing your Trulicity to 3.0 mg weekly.  I will send in the new prescription.  While you increase this I would like you to decrease your metformin to once a day.  If you feel like your blood sugar is increasing please let us know before restarting it.  Ideally we will have you decrease the metformin completely if we can control it with just the Trulicity.  I am also getting a CBC and TSH to check for your cause of fatigue.  I will send in your prescription for pain medications.  I will call you with the results of the test when I get them.  Have a great day,  Clemetine Marker, MD

## 2020-12-07 LAB — CBC
Hematocrit: 37.4 % (ref 34.0–46.6)
Hemoglobin: 12.2 g/dL (ref 11.1–15.9)
MCH: 29.2 pg (ref 26.6–33.0)
MCHC: 32.6 g/dL (ref 31.5–35.7)
MCV: 90 fL (ref 79–97)
Platelets: 308 10*3/uL (ref 150–450)
RBC: 4.18 x10E6/uL (ref 3.77–5.28)
RDW: 15.4 % (ref 11.7–15.4)
WBC: 10.8 10*3/uL (ref 3.4–10.8)

## 2020-12-07 LAB — TSH: TSH: 2.36 u[IU]/mL (ref 0.450–4.500)

## 2020-12-08 DIAGNOSIS — Z79899 Other long term (current) drug therapy: Secondary | ICD-10-CM | POA: Diagnosis not present

## 2020-12-08 DIAGNOSIS — M059 Rheumatoid arthritis with rheumatoid factor, unspecified: Secondary | ICD-10-CM | POA: Diagnosis not present

## 2020-12-08 MED ORDER — OXYCODONE-ACETAMINOPHEN 10-325 MG PO TABS: 1.0000 | ORAL_TABLET | Freq: Four times a day (QID) | ORAL | 0 refills | Status: DC | PRN

## 2020-12-08 MED ORDER — DULAGLUTIDE 3 MG/0.5ML ~~LOC~~ SOAJ: 3.0000 mg | SUBCUTANEOUS | 3 refills | Status: DC

## 2020-12-08 MED ORDER — METFORMIN HCL 1000 MG PO TABS: 1000.0000 mg | ORAL_TABLET | Freq: Every day | ORAL | 3 refills | Status: DC

## 2020-12-08 NOTE — Assessment & Plan Note (Signed)
Stopped her metformin temporarily but restarted it voluntarily.  a1c today is consistent with previous values.  Well controlled.  Will increase trulicity dose and reduce metformin to once a day. Recheck in 3 months.  Foot exam normal today.

## 2020-12-08 NOTE — Assessment & Plan Note (Signed)
No issues with current medications. Will refill percocet for 3 months.

## 2020-12-08 NOTE — Assessment & Plan Note (Signed)
Likely age related and worsened by nighttime awakenings.  Will get cbc and tsh.

## 2020-12-11 ENCOUNTER — Other Ambulatory Visit: Payer: Self-pay | Admitting: Family Medicine

## 2020-12-11 ENCOUNTER — Other Ambulatory Visit: Payer: Self-pay

## 2020-12-11 ENCOUNTER — Ambulatory Visit (INDEPENDENT_AMBULATORY_CARE_PROVIDER_SITE_OTHER): Payer: Medicare Other | Admitting: Dermatology

## 2020-12-11 DIAGNOSIS — L570 Actinic keratosis: Secondary | ICD-10-CM | POA: Diagnosis not present

## 2020-12-11 DIAGNOSIS — J45909 Unspecified asthma, uncomplicated: Secondary | ICD-10-CM

## 2020-12-11 DIAGNOSIS — L853 Xerosis cutis: Secondary | ICD-10-CM

## 2020-12-11 DIAGNOSIS — L820 Inflamed seborrheic keratosis: Secondary | ICD-10-CM

## 2020-12-11 DIAGNOSIS — L578 Other skin changes due to chronic exposure to nonionizing radiation: Secondary | ICD-10-CM | POA: Diagnosis not present

## 2020-12-11 DIAGNOSIS — L409 Psoriasis, unspecified: Secondary | ICD-10-CM

## 2020-12-11 MED ORDER — HYDROXYZINE HCL 10 MG PO TABS: 10.0000 mg | ORAL_TABLET | Freq: Every day | ORAL | 0 refills | Status: DC

## 2020-12-11 NOTE — Progress Notes (Signed)
   Follow-Up Visit   Subjective  Michelle [name] is a 70 y.o. female who presents for the following: Psoriasis (6 month follow up - Clobetasol solution prn to scalp, Mometasone cream prn to face, Hydroxyzine ~3 times per week prn itch).  The following portions of the chart were reviewed this encounter and updated as appropriate:   Tobacco  Allergies  Meds  Problems  Med Hx  Surg Hx  Fam Hx     Review of Systems:  No other skin or systemic complaints except as noted in HPI or Assessment and Plan.  Objective  Well appearing patient in no apparent distress; mood and affect are within normal limits.  A focused examination was performed including face, scalp, legs, arms, back. Relevant physical exam findings are noted in the Assessment and Plan.  Xerosis  Left Preauricular Area Erythematous keratotic or waxy stuck-on papule or plaque.   Face (3) Erythematous thin papules/macules with gritty scale.   Scalp, face Few pink plaques of scalp   Assessment & Plan   Actinic Damage - Severe - chronic, secondary to cumulative UV radiation exposure/sun exposure over time - diffuse scaly erythematous macules with underlying dyspigmentation - Recommend daily broad spectrum sunscreen SPF 30+ to sun-exposed areas, reapply every 2 hours as needed.  - Recommend staying in the shade or wearing long sleeves, sun glasses (UVA+UVB protection) and wide brim hats (4-inch brim around the entire circumference of the hat). - Call for new or changing lesions.  Inflamed seborrheic keratosis Left Preauricular Area  Destruction of lesion - Left Preauricular Area Complexity: simple   Destruction method: cryotherapy   Informed consent: discussed and consent obtained   Timeout:  patient name, date of birth, surgical site, and procedure verified Lesion destroyed using liquid nitrogen: Yes   Region frozen until ice ball extended beyond lesion: Yes   Outcome: patient tolerated procedure well with no  complications   Post-procedure details: wound care instructions given    AK (actinic keratosis) (3) Face  Destruction of lesion - Face Complexity: simple   Destruction method: cryotherapy   Informed consent: discussed and consent obtained   Timeout:  patient name, date of birth, surgical site, and procedure verified Lesion destroyed using liquid nitrogen: Yes   Region frozen until ice ball extended beyond lesion: Yes   Outcome: patient tolerated procedure well with no complications   Post-procedure details: wound care instructions given    Psoriasis Scalp, face Psoriasis is a chronic non-curable, but treatable genetic/hereditary disease that may have other systemic features affecting other organ systems such as joints (Psoriatic Arthritis). It is associated with an increased risk of inflammatory bowel disease, heart disease, non-alcoholic fatty liver disease, and depression.    Continue Clobetasol solution qd up to 5 times per week prn, Mometasone cream qd up to 5 times per week prn  Related Medications clobetasol (TEMOVATE) 0.05 % external solution Apply to scalp daily up to 5 days  Xerosis cutis Recommend Cerave cream daily Continue Hydroxyzine 10 mg 1 po qhs prn itch hydrOXYzine (ATARAX/VISTARIL) 10 MG tablet Take 1 tablet (10 mg total) by mouth daily. Prn itch  Return in about 1 year (around 12/11/2021).  I, Ashok Cordia, CMA, am acting as scribe for Sarina Ser, MD .  Documentation: I have reviewed the above documentation for accuracy and completeness, and I agree with the above.  Sarina Ser, MD

## 2020-12-11 NOTE — Patient Instructions (Signed)

## 2020-12-12 ENCOUNTER — Encounter: Payer: Self-pay | Admitting: Dermatology

## 2020-12-12 ENCOUNTER — Ambulatory Visit: Payer: Medicare Other | Admitting: Podiatry

## 2020-12-19 ENCOUNTER — Telehealth: Payer: Self-pay

## 2020-12-19 NOTE — Telephone Encounter (Signed)
Received fax from [facility] for refill request for the following medication:  Trulicity SD Pen 0.5 ML 4's 3 mg. No instructions or doses given  Did not see medication on med list. Ottis Stain, CMA

## 2020-12-21 NOTE — Telephone Encounter (Signed)
It is her dulaglutide.  I sent in a prescriptoin for 3mg  pens qweekly on 6/10.  Are they saying she needs a new prescription?

## 2020-12-25 ENCOUNTER — Other Ambulatory Visit: Payer: Self-pay | Admitting: Dermatology

## 2020-12-25 DIAGNOSIS — L853 Xerosis cutis: Secondary | ICD-10-CM

## 2020-12-26 NOTE — Telephone Encounter (Signed)
Per pharmacy this must have been a duplicate request and disregard. Ottis Stain, CMA

## 2020-12-28 DIAGNOSIS — Z981 Arthrodesis status: Secondary | ICD-10-CM | POA: Diagnosis not present

## 2020-12-28 DIAGNOSIS — M47816 Spondylosis without myelopathy or radiculopathy, lumbar region: Secondary | ICD-10-CM | POA: Diagnosis not present

## 2020-12-28 DIAGNOSIS — R030 Elevated blood-pressure reading, without diagnosis of hypertension: Secondary | ICD-10-CM | POA: Insufficient documentation

## 2020-12-28 DIAGNOSIS — M533 Sacrococcygeal disorders, not elsewhere classified: Secondary | ICD-10-CM | POA: Diagnosis not present

## 2020-12-28 DIAGNOSIS — M4316 Spondylolisthesis, lumbar region: Secondary | ICD-10-CM | POA: Diagnosis not present

## 2020-12-29 MED ORDER — DULAGLUTIDE 3 MG/0.5ML ~~LOC~~ SOAJ: 3.0000 mg | SUBCUTANEOUS | 0 refills | Status: DC

## 2020-12-29 NOTE — Telephone Encounter (Signed)
Patient returns call to nurse line regarding rx. Patient is using mail pharmacy that needs rx to indicate three month supply. Quantity will need to be changed to reflect this.  Talbot Grumbling, RN

## 2020-12-29 NOTE — Addendum Note (Signed)
Addended by: [name] D on: 12/29/2020 02:13 PM   Modules accepted: Orders

## 2021-01-08 DIAGNOSIS — U071 COVID-19: Secondary | ICD-10-CM | POA: Diagnosis not present

## 2021-01-16 ENCOUNTER — Telehealth: Payer: Self-pay

## 2021-01-16 NOTE — Telephone Encounter (Signed)
Patient called regarding Hydroxyzine medication. In [facility] patient is taking 25mg  1-2 tablets PO as needed nightly for itch. Then patient was sent Hydroxyzine 10mg  nightly at her last visit. Patient is unsure how this happened and stats 10mg  does nothing for her and gives no relief. Are you okay for me to send in the 25mg ?  [facility] [address]

## 2021-01-17 MED ORDER — HYDROXYZINE HCL 25 MG PO TABS: 25.0000 mg | ORAL_TABLET | ORAL | 1 refills | Status: DC

## 2021-01-17 NOTE — Telephone Encounter (Signed)
RX sent in

## 2021-01-18 DIAGNOSIS — N3000 Acute cystitis without hematuria: Secondary | ICD-10-CM | POA: Diagnosis not present

## 2021-01-18 DIAGNOSIS — N3941 Urge incontinence: Secondary | ICD-10-CM | POA: Diagnosis not present

## 2021-01-23 ENCOUNTER — Other Ambulatory Visit: Payer: Self-pay | Admitting: Dermatology

## 2021-01-23 DIAGNOSIS — L853 Xerosis cutis: Secondary | ICD-10-CM

## 2021-01-23 DIAGNOSIS — M461 Sacroiliitis, not elsewhere classified: Secondary | ICD-10-CM | POA: Diagnosis not present

## 2021-02-02 DIAGNOSIS — M059 Rheumatoid arthritis with rheumatoid factor, unspecified: Secondary | ICD-10-CM | POA: Diagnosis not present

## 2021-02-02 DIAGNOSIS — Z79899 Other long term (current) drug therapy: Secondary | ICD-10-CM | POA: Diagnosis not present

## 2021-02-09 ENCOUNTER — Encounter: Payer: Self-pay | Admitting: Podiatry

## 2021-02-09 ENCOUNTER — Ambulatory Visit (INDEPENDENT_AMBULATORY_CARE_PROVIDER_SITE_OTHER): Payer: Medicare Other | Admitting: Podiatry

## 2021-02-09 ENCOUNTER — Other Ambulatory Visit: Payer: Self-pay

## 2021-02-09 DIAGNOSIS — B351 Tinea unguium: Secondary | ICD-10-CM

## 2021-02-09 DIAGNOSIS — E119 Type 2 diabetes mellitus without complications: Secondary | ICD-10-CM

## 2021-02-09 DIAGNOSIS — L84 Corns and callosities: Secondary | ICD-10-CM | POA: Diagnosis not present

## 2021-02-09 DIAGNOSIS — M79674 Pain in right toe(s): Secondary | ICD-10-CM | POA: Diagnosis not present

## 2021-02-09 DIAGNOSIS — M79675 Pain in left toe(s): Secondary | ICD-10-CM

## 2021-02-12 NOTE — Progress Notes (Signed)
Subjective: Michelle [name] is a pleasant 70 y.o. female patient seen today painful thick toenails that are difficult to trim. Pain interferes with ambulation. Aggravating factors include wearing enclosed shoe gear. Pain is relieved with periodic professional debridement.  She is diabetic and also has painful corn of left 3rd toe.  She does not monitor her blood glucose daily.  Last A1c was 6.9 %. She voices no new pedal problems on today's visit.  PCP is [name], MD. Last visit was: June, 2022 per patient.  Allergies  Allergen Reactions   Zofran [Ondansetron Hcl] Other (See Comments)    Prolong QT   Ace Inhibitors Cough    Occurred with liinopril   Levofloxacin Other (See Comments)    Felt things on her legs that were not there,made her stomach hurt   Lisinopril Cough   Sulfonamide Derivatives Hives and Itching    Objective: Physical Exam  General: Michelle [name] is a pleasant 70 y.o. Caucasian female, WD, WN in NAD. AAO x 3.   Vascular:  Capillary refill time to digits immediate b/l. Palpable pedal pulses b/l LE. Pedal hair sparse. Lower extremity skin temperature gradient within normal limits. No pain with calf compression b/l. No edema noted b/l lower extremities.  Dermatological:  Pedal skin with normal turgor, texture and tone b/l lower extremities. No open wounds b/l lower extremities. No interdigital macerations b/l lower extremities. Toenails 1-5 b/l elongated, discolored, dystrophic, thickened, crumbly with subungual debris and tenderness to dorsal palpation. Hyperkeratotic lesion(s) L 3rd toe.  No erythema, no edema, no drainage, no fluctuance.  Musculoskeletal:  Normal muscle strength 5/5 to all lower extremity muscle groups bilaterally. No pain crepitus or joint limitation noted with ROM b/l lower extremities. No gross bony deformities b/l lower extremities.  Neurological:  Protective sensation intact 5/5 intact bilaterally with 10g monofilament b/l. Vibratory  sensation intact b/l.  Assessment and Plan:  1. Pain due to onychomycosis of toenails of both feet   2. Corns   3. Diabetes mellitus without complication ([place])      -Examined patient. -No new findings. No new orders. -Continue diabetic foot care principles: inspect feet daily, monitor glucose as recommended by PCP and/or Endocrinologist, and follow prescribed diet per PCP, Endocrinologist and/or dietician. -Patient to continue soft, supportive shoe gear daily. -Toenails 1-5 b/l were debrided in length and girth with sterile nail nippers and dremel without iatrogenic bleeding.  -Corn(s) L 3rd toe pared utilizing sterile scalpel blade without complication or incident. Total number debrided=1. -Patient to report any pedal injuries to medical professional immediately. -Patient/POA to call should there be question/concern in the interim.  Return in about 3 months (around 05/12/2021).  Marzetta Board, DPM

## 2021-02-13 ENCOUNTER — Other Ambulatory Visit: Payer: Self-pay | Admitting: Neurology

## 2021-02-21 ENCOUNTER — Other Ambulatory Visit: Payer: Self-pay | Admitting: Family Medicine

## 2021-02-21 DIAGNOSIS — M47816 Spondylosis without myelopathy or radiculopathy, lumbar region: Secondary | ICD-10-CM | POA: Diagnosis not present

## 2021-02-24 ENCOUNTER — Other Ambulatory Visit: Payer: Self-pay | Admitting: Family Medicine

## 2021-02-24 DIAGNOSIS — E119 Type 2 diabetes mellitus without complications: Secondary | ICD-10-CM

## 2021-02-28 ENCOUNTER — Other Ambulatory Visit: Payer: Self-pay | Admitting: Family Medicine

## 2021-02-28 ENCOUNTER — Other Ambulatory Visit: Payer: Self-pay

## 2021-02-28 ENCOUNTER — Encounter: Payer: Self-pay | Admitting: Family Medicine

## 2021-02-28 ENCOUNTER — Ambulatory Visit (INDEPENDENT_AMBULATORY_CARE_PROVIDER_SITE_OTHER): Payer: Medicare Other | Admitting: Family Medicine

## 2021-02-28 VITALS — BP 124/70 | HR 74 | Ht 62.0 in | Wt 137.0 lb

## 2021-02-28 DIAGNOSIS — D509 Iron deficiency anemia, unspecified: Secondary | ICD-10-CM | POA: Diagnosis not present

## 2021-02-28 DIAGNOSIS — E785 Hyperlipidemia, unspecified: Secondary | ICD-10-CM

## 2021-02-28 DIAGNOSIS — E1142 Type 2 diabetes mellitus with diabetic polyneuropathy: Secondary | ICD-10-CM | POA: Diagnosis not present

## 2021-02-28 DIAGNOSIS — E119 Type 2 diabetes mellitus without complications: Secondary | ICD-10-CM

## 2021-02-28 DIAGNOSIS — G8929 Other chronic pain: Secondary | ICD-10-CM

## 2021-02-28 MED ORDER — OXYCODONE-ACETAMINOPHEN 10-325 MG PO TABS: 1.0000 | ORAL_TABLET | Freq: Four times a day (QID) | ORAL | 0 refills | Status: DC | PRN

## 2021-02-28 MED ORDER — ONETOUCH VERIO VI STRP: ORAL_STRIP | 12 refills | Status: DC

## 2021-02-28 MED ORDER — DULAGLUTIDE 3 MG/0.5ML ~~LOC~~ SOAJ: 3.0000 mg | SUBCUTANEOUS | 0 refills | Status: DC

## 2021-02-28 NOTE — Patient Instructions (Addendum)
It was nice seeing you today!  Follow-up in 6 months if your A1c looks good today. Checking cholesterol, iron and blood counts today. I will update you via Mychart or letter if everything looks normal.  Medications refilled today.  Please arrive at least 15 minutes prior to your scheduled appointments.  Stay well, [name], MD [place] [phone]

## 2021-02-28 NOTE — Assessment & Plan Note (Signed)
On rosuvastatin.  Will obtain lipid panel today.

## 2021-02-28 NOTE — Assessment & Plan Note (Signed)
Well-controlled.  Will obtain A1c today and adjust medications if necessary.  Follow-up in 6 months if still under good control.

## 2021-02-28 NOTE — Assessment & Plan Note (Signed)
Continue current pain control regimen.  Oxycodone-acetaminophen refilled for 3 additional months.

## 2021-02-28 NOTE — Progress Notes (Signed)
    SUBJECTIVE:   CHIEF COMPLAINT / HPI:   T2DM Medications include dulaglutide 3 mg weekly, metformin 1000 mg daily Fasting sugars mostly low 100s.  Usually checks her sugar in the morning.  Chronic pain Patient has a history of chronic pain and RA.  Previous PCP Dr. Jeannine Kitten has been feeling her oxycodone-acetaminophen prescription 3 months at a time with 3 different prescriptions.  Patient states she has taken this every 6 hours for several years and does not take any extra. Needs medications for October, November, December.  Memory concern Reports difficulty with memory for a few years. Has been to neurologist in the past but was told she did not have any cognitive impairment. This was over a year ago. Asking about donepezil She does not want repeat cognitive testing at this time.  PERTINENT  PMH / PSH: T2DM, HTN, RA, chronic pain, HLD, GAD  OBJECTIVE:   BP 124/70   Pulse 74   Ht '5\' 2"'$  (1.575 m)   Wt 137 lb (62.1 kg)   BMI 25.06 kg/m   General: Elderly female, NAD CV: RRR, no murmurs Pulm: CTAB, no wheezes or rales  ASSESSMENT/PLAN:   Encounter for chronic pain management Continue current pain control regimen.  Oxycodone-acetaminophen refilled for 3 additional months.  Hyperlipidemia On rosuvastatin.  Will obtain lipid panel today.  Type II diabetes mellitus Well-controlled.  Will obtain A1c today and adjust medications if necessary.  Follow-up in 6 months if still under good control.   Iron deficiency anemia On ferrous sulfate every other day.  Will obtain CBC and iron studies to see if she needs to continue on iron supplement.  Memory concern Reports normal cognitive testing about 1 year ago.  Offered geriatric clinic for repeat testing, but patient declined.  Can consider referral in the future if patient desires.  [name], MD [place]

## 2021-02-28 NOTE — Telephone Encounter (Signed)
Patient calls nurse line regarding glucose strips needing specific directions for insurance to cover cost.   Patient states that she checks blood glucose once daily. [facility]   Patient also reports [facility]  FYI to PCP.   Talbot Grumbling, RN

## 2021-03-01 LAB — CBC
Hematocrit: 37.7 % (ref 34.0–46.6)
Hemoglobin: 12 g/dL (ref 11.1–15.9)
MCH: 29.3 pg (ref 26.6–33.0)
MCHC: 31.8 g/dL (ref 31.5–35.7)
MCV: 92 fL (ref 79–97)
Platelets: 259 10*3/uL (ref 150–450)
RBC: 4.09 x10E6/uL (ref 3.77–5.28)
RDW: 13.5 % (ref 11.7–15.4)
WBC: 8.7 10*3/uL (ref 3.4–10.8)

## 2021-03-01 LAB — LIPID PANEL
Chol/HDL Ratio: 2.6 ratio (ref 0.0–4.4)
Cholesterol, Total: 110 mg/dL (ref 100–199)
HDL: 42 mg/dL (ref >39)
LDL Chol Calc (NIH): 49 mg/dL (ref 0–99)
Triglycerides: 100 mg/dL (ref 0–149)
VLDL Cholesterol Cal: 19 mg/dL (ref 5–40)

## 2021-03-01 LAB — IRON AND TIBC
Iron Saturation: 15 % (ref 15–55)
Iron: 60 ug/dL (ref 27–139)
Total Iron Binding Capacity: 405 ug/dL (ref 250–450)
UIBC: 345 ug/dL (ref 118–369)

## 2021-03-01 LAB — HEMOGLOBIN A1C
Est. average glucose Bld gHb Est-mCnc: 169 mg/dL
Hgb A1c MFr Bld: 7.5 % — ABNORMAL HIGH (ref 4.8–5.6)

## 2021-03-01 LAB — FERRITIN: Ferritin: 50 ng/mL (ref 15–150)

## 2021-03-02 ENCOUNTER — Telehealth: Payer: Self-pay | Admitting: Family Medicine

## 2021-03-02 DIAGNOSIS — E119 Type 2 diabetes mellitus without complications: Secondary | ICD-10-CM

## 2021-03-02 MED ORDER — METFORMIN HCL 1000 MG PO TABS: 1000.0000 mg | ORAL_TABLET | Freq: Two times a day (BID) | ORAL | 3 refills | Status: DC

## 2021-03-02 NOTE — Telephone Encounter (Signed)
Spoke with patient regarding lab results.  CBC, ferritin, and iron studies were within normal limits.  Discussed that she could stop taking the iron supplements if she wanted and we can recheck levels and the next few months.  A1c is slightly up to 7.5.  Discussed that I want to see her back in 3 months.  Discussed that we could either increase metformin to 1000 mg twice a day or continue her current medications and wait for her next A1c check to decide further management.  She opted to increase her metformin.

## 2021-03-06 ENCOUNTER — Other Ambulatory Visit: Payer: Self-pay | Admitting: Family Medicine

## 2021-03-06 DIAGNOSIS — E119 Type 2 diabetes mellitus without complications: Secondary | ICD-10-CM

## 2021-03-06 NOTE — Telephone Encounter (Signed)
Will send in under an attending.

## 2021-03-20 DIAGNOSIS — M47816 Spondylosis without myelopathy or radiculopathy, lumbar region: Secondary | ICD-10-CM | POA: Diagnosis not present

## 2021-03-22 ENCOUNTER — Telehealth: Payer: Self-pay

## 2021-03-22 ENCOUNTER — Other Ambulatory Visit (HOSPITAL_BASED_OUTPATIENT_CLINIC_OR_DEPARTMENT_OTHER): Payer: Self-pay

## 2021-03-22 MED ORDER — PAXLOVID (300/100) 20 X 150 MG & 10 X 100MG PO TBPK
ORAL_TABLET | ORAL | 0 refills | Status: DC
  Filled 2021-03-22: qty 30, 5d supply, fill #0

## 2021-03-22 NOTE — Telephone Encounter (Signed)
Called patient and provided with below information.   Talbot Grumbling, RN

## 2021-03-22 NOTE — Telephone Encounter (Signed)
I think she would be a good candidate. She will need to call either of these two pharmacies to see if she qualifies for Paxlovid. She should not go to the pharmacy until speaking with the pharmacist. Thank you!  [facility] [address] [place], [place] [address]  [facility] [place], [place] [address]

## 2021-03-22 NOTE — Telephone Encounter (Signed)
Patient calls nurse line regarding faint positive on home COVID test. Reports "feeling bad", body aches, decreased appetite and cough.   Denies fever or SHOB.   Please advise if patient would be a good candidate for antiviral therapy.   Talbot Grumbling, RN

## 2021-03-23 ENCOUNTER — Telehealth: Payer: Self-pay

## 2021-03-23 ENCOUNTER — Other Ambulatory Visit (HOSPITAL_BASED_OUTPATIENT_CLINIC_OR_DEPARTMENT_OTHER): Payer: Self-pay

## 2021-03-23 MED ORDER — ONDANSETRON 4 MG PO TBDP: 4.0000 mg | ORAL_TABLET | Freq: Three times a day (TID) | ORAL | 0 refills | Status: DC | PRN

## 2021-03-23 NOTE — Telephone Encounter (Signed)
Patient calls nurse line stating she tested positive for Covid. Patient reports she stated Paxlovid yesterday, however she has been nauseated. Patient is unsure if the medication is causing the nausea or the virus. Patient is requesting Zofran to her pharmacy. Please advise.

## 2021-03-24 ENCOUNTER — Other Ambulatory Visit: Payer: Self-pay | Admitting: Dermatology

## 2021-03-24 ENCOUNTER — Other Ambulatory Visit: Payer: Self-pay | Admitting: Family Medicine

## 2021-03-26 ENCOUNTER — Other Ambulatory Visit: Payer: Self-pay | Admitting: Family Medicine

## 2021-03-28 DIAGNOSIS — U071 COVID-19: Secondary | ICD-10-CM | POA: Diagnosis not present

## 2021-04-03 ENCOUNTER — Encounter: Payer: Self-pay | Admitting: Gastroenterology

## 2021-04-03 ENCOUNTER — Ambulatory Visit (INDEPENDENT_AMBULATORY_CARE_PROVIDER_SITE_OTHER): Payer: Medicare Other | Admitting: Gastroenterology

## 2021-04-03 DIAGNOSIS — R1310 Dysphagia, unspecified: Secondary | ICD-10-CM | POA: Diagnosis not present

## 2021-04-03 DIAGNOSIS — K529 Noninfective gastroenteritis and colitis, unspecified: Secondary | ICD-10-CM

## 2021-04-03 MED ORDER — CHOLESTYRAMINE 4 G PO PACK: 4.0000 g | PACK | Freq: Two times a day (BID) | ORAL | 11 refills | Status: DC

## 2021-04-03 NOTE — Patient Instructions (Addendum)
If you are age 71 or older, your body mass index should be between 23-30. Your Body mass index is 24.69 kg/m. If this is out of the aforementioned range listed, please consider follow up with your Primary Care Provider. __________________________________________________________  The [facility] providers would like to encourage you to use [facility] to communicate with providers for non-urgent requests or questions.  Due to long hold times on the telephone, sending your provider a message by [facility] may be a faster and more efficient way to get a response.  Please allow 48 business hours for a response.  Please remember that this is for non-urgent requests.   You have been scheduled for an endoscopy and colonoscopy. Please follow the written instructions given to you at your visit today. Please pick up your prep supplies at the pharmacy within the next 1-3 days. If you use inhalers (even only as needed), please bring them with you on the day of your procedure.   Due to recent changes in healthcare laws, you may see the results of your imaging and laboratory studies on MyChart before your provider has had a chance to review them.  We understand that in some cases there may be results that are confusing or concerning to you. Not all laboratory results come back in the same time frame and the provider may be waiting for multiple results in order to interpret others.  Please give [place] 48 hours in order for your provider to thoroughly review all the results before contacting the office for clarification of your results.   STOP: Imodium  We have sent the following medications to your pharmacy for you to pick up at your convenience:  START: cholestyramine 4 grams one dose twice daily with food.  Thank you for entrusting me with your care and choosing [facility].  Dr Ardis Hughs

## 2021-04-03 NOTE — Progress Notes (Signed)
Review of pertinent gastrointestinal problems: 1.  History of adenomatous colon polyp.  Colonoscopy October 2019 single subcentimeter adenoma was removed.  Diverticulosis was noted as well.  2.  EGD October 2019 mild gastritis and small hiatal hernia.  Pathology was negative for H. Pylori. 3.  Chronic diarrhea, many of her medicines probably play a role including metformin, Trulicity, arava.  Blood work 2022 celiac sprue serologies negative.  GI pathogen panel 2022 was positive for Campylobacter, treatment with a azithromycin 1 week did not help.  Repeat GI pathogen panel negative, pancreatic elastase was normal.  Sed rate normal.  CRP normal.    HPI: This is a very pleasant 70 year old woman   I last saw her May 2022 for her chronic diarrhea and family history of colon cancer.  I explained to her again that she was on 3 medicines which are well known to cause diarrhea including her Arava, her Glucophage and her Trulicity.  I recommended she get serious about taking her Imodium on a scheduled basis.  Blood work August 2022 normal CBC, hemoglobin A1c 7.5.  Her weight is down 5 pounds since her last office visit here 5 months ago.  Since her last visit she tells me she has been taking her Imodium 2 to 3 pills daily.  She is still having diarrhea every day.  Nonbloody.  This occurs 7 or 8 times throughout the day and sometimes at night.  She has had fecal incontinence.  She is also starting to have dysphagia for the past several weeks.  Solid food only, also sometimes with pills.  This occurs several times per week.  ROS: complete GI ROS as described in HPI, all other review negative.  Constitutional:  No unintentional weight loss   Past Medical History:  Diagnosis Date   Actinic keratosis    Allergic rhinitis 08/28/2006   Anemia    low iron   Asthma, chronic 03/14/2010   Does seem to use her albuterol excessively. Consider change to more manageable medicines and treat more like COPD in  the future.   Carpal tunnel syndrome, left 05/29/2009   Last Assessment & Plan:  Advised to wear wrist brace at night when she is sleeping.  If this does not help will consider having her go to sports medicine.   Chronic frontal sinusitis 08/28/2016   Chronic left maxillary sinusitis 08/28/2016   Diverticulosis    Essential hypertension 10/30/2012   Gastroparesis 11/19/2011   DM and chronic narcotics contribute.  Causes dysphagia like symptoms    Generalized anxiety disorder 08/28/2006   GERD (gastroesophageal reflux disease)    Hearing loss 07/19/2015   Hearing aid in right ear   History of cataracts, bilateral    Hyperlipidemia 08/28/2006   Lumbar radiculopathy 04/07/2018   Lung nodules    Osteoporosis 08/08/2011   Pain in joint, pelvic region and thigh 11/18/2012   Radial styloid tenosynovitis 08/15/2013   Restless leg syndrome 05/14/2012   Seropositive rheumatoid arthritis 01/08/2010   Patient is being followed by rheumatologist at [facility] patient is on Plaquenil and as well as prednisone.   Changed to methotrexate and prednisone in May of 2013  Patient does have a pain contract here with [facility] receives oxycodone 10/325 mg tabs every 6 hours. This has been titrated down from significant doses of methadone previously. Could attempt to tit   Shoulder joint pain 03/25/2011   The patient is compensating due to her not being able to use her left  arm right now rotator cuff impingement if he continues to worsen would not be surprised if she does get a tear on this side as well   Squamous cell carcinoma in situ of skin 11/25/2014   Referred to dermatology for excision    Squamous cell carcinoma of skin 12/26/2014   Right distal pretibial. SCC-KA pattern.   Squamous cell carcinoma of skin 03/27/2015   Right inf. lat. knee. SCC-KA pattern.    Squamous cell carcinoma of skin 10/07/2016   Left lateral calf superior. WD SCC.   Squamous cell carcinoma of skin  10/07/2016   Left lateral calf inferior. WD SCC.   Squamous cell carcinoma of skin 04/28/2018   Left mid med. pretibial. WD SCC.   Squamous cell carcinoma of skin 08/12/2019   Left mid med. pretibial sup. KA type. EDC   Squamous cell carcinoma of skin 08/12/2019   Left mid med. pretibial inferior. WD. EDC.   Tear of medial meniscus of knee, left 04/27/2009   Type II diabetes mellitus 08/28/2006   Overview:  Patient has her ups and downs, go in line with flares of her RA and prednisone uses.  Patient does have hx of hypoglycemic events so would not try for perfect control but should have A1c goal around 7.0 Lab Results  Component Value Date   HGBA1C 6.9 12/27/2011   Last Assessment & Plan:  At goal. Will continue current metformin    Past Surgical History:  Procedure Laterality Date   ABDOMINAL HYSTERECTOMY     APPENDECTOMY     CARPAL TUNNEL RELEASE Right    CATARACT EXTRACTION Left    with lid lift   CHOLECYSTECTOMY     COLONOSCOPY     HERNIA REPAIR     KNEE SURGERY Right    x2- cartilage annd scar tissue   LEFT AND RIGHT HEART CATHETERIZATION WITH CORONARY ANGIOGRAM N/A 08/17/2013   Procedure: LEFT AND RIGHT HEART CATHETERIZATION WITH CORONARY ANGIOGRAM;  Surgeon: Laverda Page, MD;  Location: [facility] CATH LAB;  Service: Cardiovascular;  Laterality: N/A;   lumbar back surgery  04/04/2018   done by neurosurgeron   right foot bone spurs removed     ROTATOR CUFF REPAIR Right    SHOULDER SURGERY Left    x2- rotatar cuff tear   UPPER GASTROINTESTINAL ENDOSCOPY      Current Outpatient Medications  Medication Sig Dispense Refill   acetaminophen (TYLENOL) 500 MG tablet Take 1,000 mg by mouth every 6 (six) hours as needed for moderate pain or headache.     Ascorbic Acid (VITAMIN C) 1000 MG tablet Take 1,000 mg by mouth daily.     betamethasone dipropionate 0.05 % lotion Apply topically as directed. Apply to the scalp daily up to 5 days for scalp 60 mL 2   Blood Glucose Monitoring Suppl  (ONETOUCH VERIO) w/Device KIT Check blood glucose once a day 1 kit 0   cetirizine (EQ ALLERGY RELIEF, CETIRIZINE,) 10 MG tablet Take 1 tablet (10 mg total) by mouth at bedtime. 90 tablet 3   chlorhexidine (PERIDEX) 0.12 % solution Use as directed 15 mLs in the mouth or throat 2 (two) times daily. (Patient taking differently: Use as directed 15 mLs in the mouth or throat 2 (two) times daily as needed (mouth sores).) 1893 mL 3   clobetasol (TEMOVATE) 0.05 % external solution Apply to scalp daily up to 5 days 50 mL 2   conjugated estrogens (PREMARIN) vaginal cream Place 1 Applicatorful vaginally daily. 42.5 g 12  cromolyn (OPTICROM) 4 % ophthalmic solution SMARTSIG:In Eye(s)     cyclobenzaprine (FLEXERIL) 10 MG tablet Take 10 mg by mouth at bedtime as needed for muscle spasms.      diclofenac Sodium (VOLTAREN) 1 % GEL Apply 1 application topically 4 (four) times daily.     Dulaglutide 3 MG/0.5ML SOPN Inject 3 mg into the skin once a week. 6 mL 0   fluticasone (FLONASE) 50 MCG/ACT nasal spray PLACE 2 SPRAYS INTO BOTH NOSTRILS DAILY AS NEEDED FOR ALLERGIES. 144 mL 1   gabapentin (NEURONTIN) 300 MG capsule TAKE 1 CAPSULE BY MOUTH EVERYDAY AT BEDTIME 90 capsule 3   hydrOXYzine (ATARAX/VISTARIL) 25 MG tablet TAKE 1 OR 2 TABLETS BY MOUTH AT BEDTIME AS NEEDED FOR ITCHING 90 tablet 1   inFLIXimab (REMICADE) 100 MG injection Inject 100 mg into the vein every 7 (seven) weeks.      leflunomide (ARAVA) 10 MG tablet Take 10 mg by mouth daily.     losartan (COZAAR) 25 MG tablet TAKE 1/2 TABLET BY MOUTH ONCE DAILY 45 tablet 3   metFORMIN (GLUCOPHAGE) 1000 MG tablet Take 1 tablet (1,000 mg total) by mouth 2 (two) times daily with a meal. 180 tablet 3   mometasone (ELOCON) 0.1 % cream Apply 1 application topically 2 (two) times daily as needed (sores).      mometasone-formoterol (DULERA) 100-5 MCG/ACT AERO Inhale 2 puffs into the lungs 2 (two) times daily. 1 each 11   montelukast (SINGULAIR) 10 MG tablet       Multiple Vitamin (MULTIVITAMIN WITH MINERALS) TABS tablet Take 1 tablet by mouth daily.     MYRBETRIQ 25 MG TB24 tablet Take 25 mg by mouth daily.     nirmatrelvir & ritonavir (PAXLOVID, 300/100,) 20 x 150 MG & 10 x 100MG TBPK Take 3 tablets by mouth twice daily x 5 days 30 tablet 0   nitroGLYCERIN (NITROSTAT) 0.4 MG SL tablet Place 1 tablet (0.4 mg total) under the tongue every 5 (five) minutes as needed. 25 tablet 1   omeprazole (PRILOSEC) 40 MG capsule TAKE 1 CAPSULE BY MOUTH TWICE A DAY 180 capsule 3   ondansetron (ZOFRAN ODT) 4 MG disintegrating tablet Take 1 tablet (4 mg total) by mouth every 8 (eight) hours as needed for nausea or vomiting. 10 tablet 0   ONETOUCH VERIO test strip PLEASE USE TO CHECK BLOOD GLUCOSE ONCE DAILY. 100 strip 12   oxyCODONE-acetaminophen (PERCOCET) 10-325 MG tablet Take 1 tablet by mouth every 6 (six) hours as needed for pain. 120 tablet 0   [START ON 04/15/2021] oxyCODONE-acetaminophen (PERCOCET) 10-325 MG tablet Take 1 tablet by mouth every 6 (six) hours as needed for pain. 120 tablet 0   [START ON 05/16/2021] oxyCODONE-acetaminophen (PERCOCET) 10-325 MG tablet Take 1 tablet by mouth every 6 (six) hours as needed for pain. 120 tablet 0   Pramoxine HCl 1 % CREA Apply 1 Dose topically as directed. 340 g 1   primidone (MYSOLINE) 50 MG tablet TAKE 2 TABLETS BY MOUTH ONCE DAILY IN THE MORNING AND 1 IN THE EVENING 15 tablet 0   rosuvastatin (CRESTOR) 10 MG tablet TAKE 1 TABLET BY MOUTH EVERY DAY 90 tablet 3   sertraline (ZOLOFT) 50 MG tablet TAKE 1 TABLET BY MOUTH EVERY DAY 90 tablet 3   VENTOLIN HFA 108 (90 Base) MCG/ACT inhaler INHALE 2 PUFFS BY MOUTH EVERY 4 HOURS AS NEEDED FOR WHEEZING OR SHORTNESS OF BREATH 108 each 11   vitamin B-12 (CYANOCOBALAMIN) 1000 MCG tablet Take 1,000  mcg by mouth daily.     No current facility-administered medications for this visit.    Allergies as of 04/03/2021 - Review Complete 04/03/2021  Allergen Reaction Noted   Zofran  [ondansetron hcl] Other (See Comments) 04/02/2018   Ace inhibitors Cough 05/31/2014   Levofloxacin Other (See Comments) 09/04/2016   Lisinopril Cough 04/06/2018   Sulfonamide derivatives Hives and Itching     Family History  Problem Relation Age of Onset   Stroke Father 106   Colon cancer Maternal Aunt    Tremor Mother    Lung cancer Brother 73   Rheum arthritis Sister 22   Colon cancer Daughter    COPD Sister    Cancer Sister        in nose   Diabetes Daughter    Hypertension Daughter    Healthy Daughter    Bell's palsy Maternal Grandmother    Esophageal cancer Neg Hx    Rectal cancer Neg Hx    Stomach cancer Neg Hx     Social History   Socioeconomic History   Marital status: Married    Spouse name: Programme researcher, broadcasting/film/video   Number of children: 3   Years of education: 9   Highest education level: 9th grade  Occupational History   Occupation: Retired/disabled    Fish farm manager: UNEMPLOYED  Tobacco Use   Smoking status: Former    Packs/day: 1.50    Years: 5.00    Pack years: 7.50    Types: Cigarettes    Quit date: 05/31/1994    Years since quitting: 26.8   Smokeless tobacco: Never  Vaping Use   Vaping Use: Never used  Substance and Sexual Activity   Alcohol use: No    Alcohol/week: 0.0 standard drinks   Drug use: No   Sexual activity: Not on file  Other Topics Concern   Not on file  Social History Narrative   Pt is on [employer] program [place]   Lives with husband and granddgt.   Lots of family stressors including special-needs grandaughter who lives with them.      Advance Directives: None - Patient's plan is that her husband will make decisions for her if she were not capable of making informed decisions for herself. (Discussion with Sherren Mocha McDiarmid, MD 12/11/18)   Full Code: Desires CPR/ACLS  (Discussion with Sherren Mocha McDiarmid, MD 12/11/18)         Social Determinants of Health   Financial Resource Strain: Not on file  Food Insecurity: Not on file  Transportation Needs: Not  on file  Physical Activity: Not on file  Stress: Not on file  Social Connections: Not on file  Intimate Partner Violence: Not on file     Physical Exam: BP 120/68   Pulse (!) 105   Ht _0  (1.575 m)   Wt 135 lb (61.2 kg)   BMI 24.69 kg/m  Constitutional: generally well-appearing Psychiatric: alert and oriented x3 Abdomen: soft, nontender, nondistended, no obvious ascites, no peritoneal signs, normal bowel sounds No peripheral edema noted in lower extremities  Assessment and plan: 70 y.o. female with chronic diarrhea, new dysphagia  I explained to her several times that she is on several medicines that are very likely to be causing or contributing to her chronic diarrhea.  She is still on those medicines.  The #1 side effect of overall his diarrhea.  The #1 side effect of Glucophage is diarrhea.  The #2 side effects of Trulicity is diarrhea  Imodium is not helping control her diarrhea.  Instead she is going to try cholestyramine 4 g powder twice daily.  She really seems to be at her wits end with this diarrhea and fecal incontinence.  If none of her medicines are going to be adjusted as I have suggested several times, then I think we should try again to see if something else is going on in her colon with a colonoscopy and look in her terminal ileum.  At the same time I will proceed with EGD given her new dysphagia.  Given her multiple medical comorbid problems I think it is going be safest if this is done in the hospital setting.  Please see the "Patient Instructions" section for addition details about the plan.  Owens Loffler, MD [facility] 04/03/2021, 9:50 AM   Total time on date of encounter was 35 minutes (this included time spent preparing to see the patient reviewing records; obtaining and/or reviewing separately obtained history; performing a medically appropriate exam and/or evaluation; counseling and educating the patient and family if present; ordering  medications, tests or procedures if applicable; and documenting clinical information in the health record).

## 2021-04-04 ENCOUNTER — Telehealth: Payer: Self-pay

## 2021-04-04 NOTE — Telephone Encounter (Signed)
Faxed received from [facility]. Pt requesting a refill of Budesonide-formoterol. Did not see this on current med list. Michelle [name], [place]

## 2021-04-05 DIAGNOSIS — M059 Rheumatoid arthritis with rheumatoid factor, unspecified: Secondary | ICD-10-CM | POA: Diagnosis not present

## 2021-04-05 NOTE — Telephone Encounter (Signed)
Correct, this was switched to mometasone-formoterol [facility]). She should be on one or the other but not both.

## 2021-04-09 ENCOUNTER — Other Ambulatory Visit: Payer: Self-pay

## 2021-04-09 MED ORDER — DULAGLUTIDE 3 MG/0.5ML ~~LOC~~ SOAJ: 3.0000 mg | SUBCUTANEOUS | 0 refills | Status: AC

## 2021-04-11 DIAGNOSIS — M47816 Spondylosis without myelopathy or radiculopathy, lumbar region: Secondary | ICD-10-CM | POA: Diagnosis not present

## 2021-04-11 DIAGNOSIS — Z981 Arthrodesis status: Secondary | ICD-10-CM | POA: Diagnosis not present

## 2021-04-18 DIAGNOSIS — M153 Secondary multiple arthritis: Secondary | ICD-10-CM | POA: Diagnosis not present

## 2021-04-18 DIAGNOSIS — F341 Dysthymic disorder: Secondary | ICD-10-CM | POA: Diagnosis not present

## 2021-04-18 DIAGNOSIS — Z79899 Other long term (current) drug therapy: Secondary | ICD-10-CM | POA: Diagnosis not present

## 2021-04-18 DIAGNOSIS — M1929 Secondary osteoarthritis, other specified site: Secondary | ICD-10-CM | POA: Diagnosis not present

## 2021-04-18 DIAGNOSIS — R197 Diarrhea, unspecified: Secondary | ICD-10-CM | POA: Diagnosis not present

## 2021-04-18 DIAGNOSIS — Z882 Allergy status to sulfonamides status: Secondary | ICD-10-CM | POA: Diagnosis not present

## 2021-04-18 DIAGNOSIS — Z881 Allergy status to other antibiotic agents status: Secondary | ICD-10-CM | POA: Diagnosis not present

## 2021-04-18 DIAGNOSIS — Z7984 Long term (current) use of oral hypoglycemic drugs: Secondary | ICD-10-CM | POA: Diagnosis not present

## 2021-04-18 DIAGNOSIS — Z791 Long term (current) use of non-steroidal anti-inflammatories (NSAID): Secondary | ICD-10-CM | POA: Diagnosis not present

## 2021-04-18 DIAGNOSIS — R413 Other amnesia: Secondary | ICD-10-CM | POA: Diagnosis not present

## 2021-04-18 DIAGNOSIS — Z888 Allergy status to other drugs, medicaments and biological substances status: Secondary | ICD-10-CM | POA: Diagnosis not present

## 2021-04-18 DIAGNOSIS — Z23 Encounter for immunization: Secondary | ICD-10-CM | POA: Diagnosis not present

## 2021-04-18 DIAGNOSIS — M059 Rheumatoid arthritis with rheumatoid factor, unspecified: Secondary | ICD-10-CM | POA: Diagnosis not present

## 2021-04-18 DIAGNOSIS — E119 Type 2 diabetes mellitus without complications: Secondary | ICD-10-CM | POA: Diagnosis not present

## 2021-04-18 DIAGNOSIS — E785 Hyperlipidemia, unspecified: Secondary | ICD-10-CM | POA: Diagnosis not present

## 2021-04-27 ENCOUNTER — Other Ambulatory Visit: Payer: Medicare Other

## 2021-04-27 ENCOUNTER — Other Ambulatory Visit: Payer: Self-pay

## 2021-04-27 ENCOUNTER — Telehealth: Payer: Self-pay

## 2021-04-27 DIAGNOSIS — R399 Unspecified symptoms and signs involving the genitourinary system: Secondary | ICD-10-CM | POA: Diagnosis not present

## 2021-04-27 NOTE — Telephone Encounter (Signed)
Patient calls nurse line regarding possible UTI. Reports increased urinary frequency, "dribbling of urine", malodorous urine.   Denies fever, abdominal or back pain.   Spoke with preceptor, Dr. McDiarmid regarding patient. Received verbal orders for UA and culture. Placed in orders only encounter.   Patient reports having allergies to sulfa drugs and that if she is started on abx, she will also need diflucan sent in.   Will forward to PCP.   Talbot Grumbling, RN

## 2021-04-27 NOTE — Progress Notes (Signed)
Received verbal orders for UA and culture from Dr. McDiarmid.   Patient will come into clinic at around 9:30 this AM to provide sample.   Talbot Grumbling, RN

## 2021-04-30 ENCOUNTER — Other Ambulatory Visit: Payer: Self-pay

## 2021-04-30 ENCOUNTER — Encounter (HOSPITAL_COMMUNITY): Payer: Self-pay | Admitting: Gastroenterology

## 2021-04-30 ENCOUNTER — Ambulatory Visit (INDEPENDENT_AMBULATORY_CARE_PROVIDER_SITE_OTHER): Payer: Medicare Other

## 2021-04-30 ENCOUNTER — Ambulatory Visit (INDEPENDENT_AMBULATORY_CARE_PROVIDER_SITE_OTHER): Payer: Medicare Other | Admitting: Family Medicine

## 2021-04-30 ENCOUNTER — Encounter: Payer: Self-pay | Admitting: Family Medicine

## 2021-04-30 VITALS — BP 131/80 | HR 96 | Ht 62.0 in | Wt 132.8 lb

## 2021-04-30 DIAGNOSIS — Z23 Encounter for immunization: Secondary | ICD-10-CM

## 2021-04-30 DIAGNOSIS — N3001 Acute cystitis with hematuria: Secondary | ICD-10-CM | POA: Diagnosis not present

## 2021-04-30 DIAGNOSIS — R399 Unspecified symptoms and signs involving the genitourinary system: Secondary | ICD-10-CM | POA: Diagnosis not present

## 2021-04-30 LAB — POCT URINALYSIS DIP (MANUAL ENTRY)
Glucose, UA: NEGATIVE mg/dL
Nitrite, UA: NEGATIVE
Protein Ur, POC: 100 mg/dL — AB
Spec Grav, UA: 1.025 (ref 1.010–1.025)
Urobilinogen, UA: 0.2 E.U./dL
pH, UA: 6 (ref 5.0–8.0)

## 2021-04-30 MED ORDER — FLUCONAZOLE 150 MG PO TABS: 150.0000 mg | ORAL_TABLET | Freq: Once | ORAL | 0 refills | Status: AC

## 2021-04-30 MED ORDER — CEPHALEXIN 500 MG PO CAPS: 500.0000 mg | ORAL_CAPSULE | Freq: Two times a day (BID) | ORAL | 0 refills | Status: AC

## 2021-04-30 NOTE — Progress Notes (Signed)
    SUBJECTIVE:   CHIEF COMPLAINT / HPI:   SUBJECTIVE: Michelle [name] is a 70 y.o. female who complains of urinary frequency, urgency, and pelvic pressure x 4 days, without dysuria, flank pain, fever, chills, or abnormal vaginal discharge or bleeding.  She reports coming to the clinic a few days ago to leave a urine sample, but did not hear anything back.  OBJECTIVE:   BP 131/80   Pulse 96   Ht 5\' 2"  (1.575 m)   Wt 132 lb 12.8 oz (60.2 kg)   SpO2 99%   BMI 24.29 kg/m    Physical exam  General: well appearing, NAD Cardiovascular: RRR, no murmurs Lungs: CTAB. Normal WOB Abdomen: soft, non-distended, non-tender. No CVA tenderness  Skin: warm, dry. No edema  ASSESSMENT/PLAN:   No problem-specific Assessment & Plan notes found for this encounter.   UTI uncomplicated without evidence of pyelonephritis 4 days of urinary frequency, urgency, and pelvic pressure. Urine dipstick shows positive for protein, leukocytes, and hematuria. Will await reflex and culture. Prescribed Keflex 500 mg twice daily for 7 days, and per patient's request also sent in Diflucan.  Advised to return if symptoms do not improve despite treatment.  Health maintenance - COVID booster    [name]

## 2021-04-30 NOTE — Patient Instructions (Addendum)
It was great seeing you today!  You came in for concern for UTI and your test did show infection. I have sent in Keflex to take twice a day for 7 days, and also Diflucan for yeast infection after you comblete your antibiotic.   You also received your COVID booster.  Return if symptoms do not improve despite treatment.   Feel free to call with any questions or concerns at any time, at [phone].   Take care,  Dr. Shary Key [facility]

## 2021-05-01 NOTE — Telephone Encounter (Signed)
Original UA did not result or perhaps was not collected. Issue has been addressed at subsequent office visit.

## 2021-05-03 LAB — URINE CULTURE

## 2021-05-04 ENCOUNTER — Telehealth: Payer: Self-pay

## 2021-05-04 NOTE — Telephone Encounter (Signed)
Patient calls nurse line requesting another diflucan for yeast infection. Patient states that she took 1 diflucan on Wednesday for vaginal itching but she needs an additional pill. States "I am about to itch myself to death"   Patient also reports red, itchy rash on face. Denies difficulty breathing, swallowing or swelling in airway.   Patient has been treated with Keflex previously.   Please advise.   Talbot Grumbling, RN

## 2021-05-05 LAB — URINE CULTURE

## 2021-05-05 MED ORDER — FLUCONAZOLE 150 MG PO TABS: 150.0000 mg | ORAL_TABLET | Freq: Once | ORAL | 0 refills | Status: AC

## 2021-05-05 NOTE — Telephone Encounter (Signed)
Agree with Dr. Arby Barrette, I have ordered the second Diflucan

## 2021-05-10 ENCOUNTER — Encounter (HOSPITAL_COMMUNITY)
Admission: RE | Disposition: A | Discharge status: Home / Self Care | Payer: Self-pay | Source: Home / Self Care | Attending: Gastroenterology

## 2021-05-10 ENCOUNTER — Ambulatory Visit (HOSPITAL_COMMUNITY): Payer: Medicare Other | Admitting: Anesthesiology

## 2021-05-10 ENCOUNTER — Encounter (HOSPITAL_COMMUNITY): Payer: Self-pay | Admitting: Gastroenterology

## 2021-05-10 ENCOUNTER — Other Ambulatory Visit: Payer: Self-pay

## 2021-05-10 ENCOUNTER — Ambulatory Visit (HOSPITAL_COMMUNITY)
Admission: RE | Admit: 2021-05-10 | Discharge: 2021-05-10 | Disposition: A | Discharge status: Home / Self Care | Payer: Medicare Other | Attending: Gastroenterology | Admitting: Gastroenterology

## 2021-05-10 DIAGNOSIS — I10 Essential (primary) hypertension: Secondary | ICD-10-CM | POA: Diagnosis not present

## 2021-05-10 DIAGNOSIS — K449 Diaphragmatic hernia without obstruction or gangrene: Secondary | ICD-10-CM | POA: Insufficient documentation

## 2021-05-10 DIAGNOSIS — M069 Rheumatoid arthritis, unspecified: Secondary | ICD-10-CM | POA: Diagnosis not present

## 2021-05-10 DIAGNOSIS — K529 Noninfective gastroenteritis and colitis, unspecified: Secondary | ICD-10-CM | POA: Insufficient documentation

## 2021-05-10 DIAGNOSIS — Z87891 Personal history of nicotine dependence: Secondary | ICD-10-CM | POA: Diagnosis not present

## 2021-05-10 DIAGNOSIS — K5730 Diverticulosis of large intestine without perforation or abscess without bleeding: Secondary | ICD-10-CM | POA: Diagnosis not present

## 2021-05-10 DIAGNOSIS — K52839 Microscopic colitis, unspecified: Secondary | ICD-10-CM | POA: Diagnosis not present

## 2021-05-10 DIAGNOSIS — K31A11 Gastric intestinal metaplasia without dysplasia, involving the antrum: Secondary | ICD-10-CM | POA: Insufficient documentation

## 2021-05-10 DIAGNOSIS — K219 Gastro-esophageal reflux disease without esophagitis: Secondary | ICD-10-CM | POA: Insufficient documentation

## 2021-05-10 DIAGNOSIS — K31A19 Gastric intestinal metaplasia without dysplasia, unspecified site: Secondary | ICD-10-CM | POA: Diagnosis not present

## 2021-05-10 DIAGNOSIS — R1310 Dysphagia, unspecified: Secondary | ICD-10-CM

## 2021-05-10 DIAGNOSIS — J45909 Unspecified asthma, uncomplicated: Secondary | ICD-10-CM | POA: Diagnosis not present

## 2021-05-10 DIAGNOSIS — E119 Type 2 diabetes mellitus without complications: Secondary | ICD-10-CM | POA: Insufficient documentation

## 2021-05-10 DIAGNOSIS — K2970 Gastritis, unspecified, without bleeding: Secondary | ICD-10-CM

## 2021-05-10 DIAGNOSIS — K319 Disease of stomach and duodenum, unspecified: Secondary | ICD-10-CM | POA: Diagnosis not present

## 2021-05-10 DIAGNOSIS — E785 Hyperlipidemia, unspecified: Secondary | ICD-10-CM | POA: Diagnosis not present

## 2021-05-10 HISTORY — PX: BIOPSY: SHX5522

## 2021-05-10 HISTORY — PX: ESOPHAGOGASTRODUODENOSCOPY (EGD) WITH PROPOFOL: SHX5813

## 2021-05-10 HISTORY — PX: COLONOSCOPY WITH PROPOFOL: SHX5780

## 2021-05-10 LAB — GLUCOSE, CAPILLARY: Glucose-Capillary: 118 mg/dL — ABNORMAL HIGH (ref 70–99)

## 2021-05-10 SURGERY — COLONOSCOPY WITH PROPOFOL
Anesthesia: Monitor Anesthesia Care

## 2021-05-10 MED ORDER — PROPOFOL 500 MG/50ML IV EMUL
INTRAVENOUS | Status: DC | PRN
  Administered 2021-05-10: 135 ug/kg/min via INTRAVENOUS

## 2021-05-10 MED ORDER — LACTATED RINGERS IV SOLN: INTRAVENOUS | Status: DC | PRN

## 2021-05-10 MED ORDER — LACTATED RINGERS IV SOLN: Freq: Once | INTRAVENOUS | Status: AC

## 2021-05-10 MED ORDER — PROPOFOL 10 MG/ML IV BOLUS
INTRAVENOUS | Status: DC | PRN
  Administered 2021-05-10 (×3): 10 mg via INTRAVENOUS

## 2021-05-10 MED ORDER — PROPOFOL 1000 MG/100ML IV EMUL
INTRAVENOUS | Status: AC
  Filled 2021-05-10: qty 100

## 2021-05-10 SURGICAL SUPPLY — 25 items

## 2021-05-10 NOTE — Anesthesia Preprocedure Evaluation (Addendum)
Anesthesia Evaluation  Patient identified by MRN, date of birth, ID band Patient awake    Reviewed: Allergy & Precautions, NPO status , Patient's Chart, lab work & pertinent test results  Airway Mallampati: II  TM Distance: >3 FB Neck ROM: Full    Dental no notable dental hx.    Pulmonary asthma , former smoker,    Pulmonary exam normal breath sounds clear to auscultation       Cardiovascular hypertension, Pt. on medications Normal cardiovascular exam Rhythm:Regular Rate:Normal     Neuro/Psych negative neurological ROS  negative psych ROS   GI/Hepatic Neg liver ROS, GERD  Medicated,  Endo/Other  diabetes, Type 2  Renal/GU negative Renal ROS  negative genitourinary   Musculoskeletal  (+) Arthritis , Rheumatoid disorders,    Abdominal   Peds negative pediatric ROS (+)  Hematology negative hematology ROS (+)   Anesthesia Other Findings   Reproductive/Obstetrics negative OB ROS                            Anesthesia Physical Anesthesia Plan  ASA: 3  Anesthesia Plan: MAC   Post-op Pain Management:    Induction: Intravenous  PONV Risk Score and Plan: 2 and Propofol infusion and Treatment may vary due to age or medical condition  Airway Management Planned: Simple Face Mask  Additional Equipment:   Intra-op Plan:   Post-operative Plan:   Informed Consent: I have reviewed the patients History and Physical, chart, labs and discussed the procedure including the risks, benefits and alternatives for the proposed anesthesia with the patient or authorized representative who has indicated his/her understanding and acceptance.     Dental advisory given  Plan Discussed with: CRNA and Surgeon  Anesthesia Plan Comments:        Anesthesia Quick Evaluation

## 2021-05-10 NOTE — Discharge Instructions (Signed)
YOU HAD AN ENDOSCOPIC PROCEDURE TODAY: Refer to the procedure report and other information in the discharge instructions given to you for any specific questions about what was found during the examination. If this information does not answer your questions, please call [place] office at [phone] to clarify.  ° °YOU SHOULD EXPECT: Some feelings of bloating in the abdomen. Passage of more gas than usual. Walking can help get rid of the air that was put into your GI tract during the procedure and reduce the bloating. If you had a lower endoscopy (such as a colonoscopy or flexible sigmoidoscopy) you may notice spotting of blood in your stool or on the toilet paper. Some abdominal soreness may be present for a day or two, also. ° °DIET: Your first meal following the procedure should be a light meal and then it is ok to progress to your normal diet. A half-sandwich or bowl of soup is an example of a good first meal. Heavy or fried foods are harder to digest and may make you feel nauseous or bloated. Drink plenty of fluids but you should avoid alcoholic beverages for 24 hours. If you had a esophageal dilation, please see attached instructions for diet.   ° °ACTIVITY: Your care partner should take you home directly after the procedure. You should plan to take it easy, moving slowly for the rest of the day. You can resume normal activity the day after the procedure however YOU SHOULD NOT DRIVE, use power tools, machinery or perform tasks that involve climbing or major physical exertion for 24 hours (because of the sedation medicines used during the test).  ° °SYMPTOMS TO REPORT IMMEDIATELY: °A gastroenterologist can be reached at any hour. Please call [phone]  for any of the following symptoms:  °Following lower endoscopy (colonoscopy, flexible sigmoidoscopy) °Excessive amounts of blood in the stool  °Significant tenderness, worsening of abdominal pains  °Swelling of the abdomen that is new, acute  °Fever of 100° or  higher  °Following upper endoscopy (EGD, EUS, ERCP, esophageal dilation) °Vomiting of blood or coffee ground material  °New, significant abdominal pain  °New, significant chest pain or pain under the shoulder blades  °Painful or persistently difficult swallowing  °New shortness of breath  °Black, tarry-looking or red, bloody stools ° °FOLLOW UP:  °If any biopsies were taken you will be contacted by phone or by letter within the next 1-3 weeks. Call [phone]  if you have not heard about the biopsies in 3 weeks.  °Please also call with any specific questions about appointments or follow up tests. ° °

## 2021-05-10 NOTE — H&P (Signed)
HPI: This is a woman with chronic diarrhea, dysphagia  ROS: complete GI ROS as described in HPI, all other review negative.  Constitutional:  No unintentional weight loss   Past Medical History:  Diagnosis Date   Actinic keratosis    Allergic rhinitis 08/28/2006   Anemia    low iron   Asthma, chronic 03/14/2010   Does seem to use her albuterol excessively. Consider change to more manageable medicines and treat more like COPD in the future.   Carpal tunnel syndrome, left 05/29/2009   Last Assessment & Plan:  Advised to wear wrist brace at night when she is sleeping.  If this does not help will consider having her go to sports medicine.   Chronic frontal sinusitis 08/28/2016   Chronic left maxillary sinusitis 08/28/2016   Diverticulosis    Essential hypertension 10/30/2012   Gastroparesis 11/19/2011   DM and chronic narcotics contribute.  Causes dysphagia like symptoms    Generalized anxiety disorder 08/28/2006   GERD (gastroesophageal reflux disease)    Hearing loss 07/19/2015   Hearing aid in right ear   History of cataracts, bilateral    Hyperlipidemia 08/28/2006   Lumbar radiculopathy 04/07/2018   Lung nodules    Osteoporosis 08/08/2011   Pain in joint, pelvic region and thigh 11/18/2012   Radial styloid tenosynovitis 08/15/2013   Restless leg syndrome 05/14/2012   Seropositive rheumatoid arthritis 01/08/2010   Patient is being followed by rheumatologist at [facility] patient is on Plaquenil and as well as prednisone.   Changed to methotrexate and prednisone in May of 2013  Patient does have a pain contract here with [facility] receives oxycodone 10/325 mg tabs every 6 hours. This has been titrated down from significant doses of methadone previously. Could attempt to tit   Shoulder joint pain 03/25/2011   The patient is compensating due to her not being able to use her left arm right now rotator cuff impingement if he continues to worsen would not  be surprised if she does get a tear on this side as well   Squamous cell carcinoma in situ of skin 11/25/2014   Referred to dermatology for excision    Squamous cell carcinoma of skin 12/26/2014   Right distal pretibial. SCC-KA pattern.   Squamous cell carcinoma of skin 03/27/2015   Right inf. lat. knee. SCC-KA pattern.    Squamous cell carcinoma of skin 10/07/2016   Left lateral calf superior. WD SCC.   Squamous cell carcinoma of skin 10/07/2016   Left lateral calf inferior. WD SCC.   Squamous cell carcinoma of skin 04/28/2018   Left mid med. pretibial. WD SCC.   Squamous cell carcinoma of skin 08/12/2019   Left mid med. pretibial sup. KA type. EDC   Squamous cell carcinoma of skin 08/12/2019   Left mid med. pretibial inferior. WD. EDC.   Tear of medial meniscus of knee, left 04/27/2009   Type II diabetes mellitus 08/28/2006   Overview:  Patient has her ups and downs, go in line with flares of her RA and prednisone uses.  Patient does have hx of hypoglycemic events so would not try for perfect control but should have A1c goal around 7.0 Lab Results  Component Value Date   HGBA1C 6.9 12/27/2011   Last Assessment & Plan:  At goal. Will continue current metformin    Past Surgical History:  Procedure Laterality Date   ABDOMINAL HYSTERECTOMY     APPENDECTOMY     CARPAL TUNNEL RELEASE  Right    CATARACT EXTRACTION Left    with lid lift   CHOLECYSTECTOMY     COLONOSCOPY     HERNIA REPAIR     KNEE SURGERY Right    x2- cartilage annd scar tissue   LEFT AND RIGHT HEART CATHETERIZATION WITH CORONARY ANGIOGRAM N/A 08/17/2013   Procedure: LEFT AND RIGHT HEART CATHETERIZATION WITH CORONARY ANGIOGRAM;  Surgeon: Laverda Page, MD;  Location: [facility] CATH LAB;  Service: Cardiovascular;  Laterality: N/A;   lumbar back surgery  04/04/2018   done by neurosurgeron   right foot bone spurs removed     ROTATOR CUFF REPAIR Right    SHOULDER SURGERY Left    x2- rotatar cuff tear   UPPER GASTROINTESTINAL  ENDOSCOPY      No current facility-administered medications for this encounter.    Allergies as of 04/03/2021 - Review Complete 04/03/2021  Allergen Reaction Noted   Zofran [ondansetron hcl] Other (See Comments) 04/02/2018   Ace inhibitors Cough 05/31/2014   Levofloxacin Other (See Comments) 09/04/2016   Lisinopril Cough 04/06/2018   Sulfonamide derivatives Hives and Itching     Family History  Problem Relation Age of Onset   Stroke Father 6   Colon cancer Maternal Aunt    Tremor Mother    Lung cancer Brother 62   Rheum arthritis Sister 31   Colon cancer Daughter    COPD Sister    Cancer Sister        in nose   Diabetes Daughter    Hypertension Daughter    Healthy Daughter    Bell's palsy Maternal Grandmother    Esophageal cancer Neg Hx    Rectal cancer Neg Hx    Stomach cancer Neg Hx     Social History   Socioeconomic History   Marital status: Married    Spouse name: Programme researcher, broadcasting/film/video   Number of children: 3   Years of education: 9   Highest education level: 9th grade  Occupational History   Occupation: Retired/disabled    Fish farm manager: UNEMPLOYED  Tobacco Use   Smoking status: Former    Packs/day: 1.50    Years: 5.00    Pack years: 7.50    Types: Cigarettes    Quit date: 05/31/1994    Years since quitting: 26.9   Smokeless tobacco: Never  Vaping Use   Vaping Use: Never used  Substance and Sexual Activity   Alcohol use: No    Alcohol/week: 0.0 standard drinks   Drug use: No   Sexual activity: Not on file  Other Topics Concern   Not on file  Social History Narrative   Pt is on [employer] program [place]   Lives with husband and granddgt.   Lots of family stressors including special-needs grandaughter who lives with them.      Advance Directives: None - Patient's plan is that her husband will make decisions for her if she were not capable of making informed decisions for herself. (Discussion with Sherren Mocha McDiarmid, MD 12/11/18)   Full Code: Desires CPR/ACLS   (Discussion with Sherren Mocha McDiarmid, MD 12/11/18)         Social Determinants of Health   Financial Resource Strain: Not on file  Food Insecurity: Not on file  Transportation Needs: Not on file  Physical Activity: Not on file  Stress: Not on file  Social Connections: Not on file  Intimate Partner Violence: Not on file     Physical Exam:  Constitutional: generally well-appearing Psychiatric: alert and oriented x3 Abdomen: soft,  nontender, nondistended, no obvious ascites, no peritoneal signs, normal bowel sounds No peripheral edema noted in lower extremities  Assessment and plan: 70 y.o. female with chronic diarrhea, dysphagia  Colonoscoy and egd today  Please see the "Patient Instructions" section for addition details about the plan.  Owens Loffler, MD [facility] 05/10/2021, 7:08 AM

## 2021-05-10 NOTE — Op Note (Signed)
[facility] Patient Name: Michelle [name] Procedure Date: 05/10/2021 MRN: [id number] Attending MD: Milus Banister , MD Date of Birth: [birth year] CSN: [id number] Age: 70 Admit Type: Outpatient Procedure:                Colonoscopy Indications:              Chronic diarrhea; this morning she explained that                            cholestyramine was terrible to take and so she                            started taking imodium 1 pill once daily on on this                            regimen her loose stools are quite improved. Providers:                Milus Banister, MD, Glori Bickers, RN, Frazier Richards, Technician Referring MD:              Medicines:                Monitored Anesthesia Care Complications:            No immediate complications. Estimated blood loss:                            None. Estimated Blood Loss:     Estimated blood loss: none. Procedure:                Pre-Anesthesia Assessment:                           - Prior to the procedure, a History and Physical                            was performed, and patient medications and                            allergies were reviewed. The patient's tolerance of                            previous anesthesia was also reviewed. The risks                            and benefits of the procedure and the sedation                            options and risks were discussed with the patient.                            All questions were answered, and informed consent  was obtained. Prior Anticoagulants: The patient has                            taken no previous anticoagulant or antiplatelet                            agents. ASA Grade Assessment: IV - A patient with                            severe systemic disease that is a constant threat                            to life. After reviewing the risks and benefits,                            the patient was  deemed in satisfactory condition to                            undergo the procedure.                           After obtaining informed consent, the colonoscope                            was passed under direct vision. Throughout the                            procedure, the patient's blood pressure, pulse, and                            oxygen saturations were monitored continuously. The                            CF-HQ190L ([id number]) Olympus colonoscope was                            introduced through the anus and advanced to the the                            terminal ileum. The colonoscopy was performed                            without difficulty. The patient tolerated the                            procedure well. The quality of the bowel                            preparation was good. Scope In: 7:52:35 AM Scope Out: 8:04:09 AM Scope Withdrawal Time: 0 hours 8 minutes 54 seconds  Total Procedure Duration: 0 hours 11 minutes 34 seconds  Findings:      The terminal ileum appeared normal.      Multiple small and large-mouthed diverticula were found in the left  colon.      Biopsies for histology were taken with a cold forceps from the entire       colon for evaluation of microscopic colitis.      The exam was otherwise without abnormality on direct and retroflexion       views. Impression:               - The examined portion of the ileum was normal.                           - Diverticulosis in the left colon.                           - The examination was otherwise normal on direct                            and retroflexion views.                           - Biopsies were taken with a cold forceps from the                            entire colon for evaluation of microscopic colitis. Moderate Sedation:      Not Applicable - Patient had care per Anesthesia. Recommendation:           - Await pathology results.                           - EGD now for  dysphagia. Procedure Code(s):        --- Professional ---                           [phone], Colonoscopy, flexible; with biopsy, single                            or multiple Diagnosis Code(s):        --- Professional ---                           K52.9, Noninfective gastroenteritis and colitis,                            unspecified                           K57.30, Diverticulosis of large intestine without                            perforation or abscess without bleeding CPT copyright 2019 American Medical Association. All rights reserved. The codes documented in this report are preliminary and upon coder review may  be revised to meet current compliance requirements. Milus Banister, MD 05/10/2021 8:07:09 AM This report has been signed electronically. Number of Addenda: 0

## 2021-05-10 NOTE — Op Note (Signed)
[facility] Patient Name: Michelle [name] Procedure Date: 05/10/2021 MRN: [id number] Attending MD: Milus Banister , MD Date of Birth: [birth year] CSN: [id number] Age: 70 Admit Type: Outpatient Procedure:                Upper GI endoscopy Indications:              Dysphagia Providers:                Milus Banister, MD, Glori Bickers, RN, Frazier Richards, Technician Referring MD:              Medicines:                Monitored Anesthesia Care Complications:            No immediate complications. Estimated blood loss:                            None. Estimated Blood Loss:     Estimated blood loss: none. Procedure:                Pre-Anesthesia Assessment:                           - Prior to the procedure, a History and Physical                            was performed, and patient medications and                            allergies were reviewed. The patient's tolerance of                            previous anesthesia was also reviewed. The risks                            and benefits of the procedure and the sedation                            options and risks were discussed with the patient.                            All questions were answered, and informed consent                            was obtained. Prior Anticoagulants: The patient has                            taken no previous anticoagulant or antiplatelet                            agents. ASA Grade Assessment: IV - A patient with                            severe systemic disease  that is a constant threat                            to life. After reviewing the risks and benefits,                            the patient was deemed in satisfactory condition to                            undergo the procedure.                           After obtaining informed consent, the endoscope was                            passed under direct vision. Throughout the                             procedure, the patient's blood pressure, pulse, and                            oxygen saturations were monitored continuously. The                            [id number]-[id number] ([id number]) Olympus endoscope was introduced                            through the mouth, and advanced to the second part                            of duodenum. The upper GI endoscopy was                            accomplished without difficulty. The patient                            tolerated the procedure well. Scope In: Scope Out: Findings:      Small hiatal hernia.      The esophagus was normal and was biopsied (distal and proximal in same       jar) and sent to pathology.      Moderate inflammation characterized by erythema, friability and       granularity was found in the gastric antrum. Biopsies were taken with a       cold forceps for histology.      The exam was otherwise without abnormality. Impression:               - Small hiatal hernia.                           - The esophagus was normal and was biopsied (distal                            and proximal in same jar) and sent to pathology.                           -  Non-specific gastritis, biopsied to check for H.                            pylori.                           - The examination was otherwise normal. Moderate Sedation:      Not Applicable - Patient had care per Anesthesia. Recommendation:           - Patient has a contact number available for                            emergencies. The signs and symptoms of potential                            delayed complications were discussed with the                            patient. Return to normal activities tomorrow.                            Written discharge instructions were provided to the                            patient.                           - Resume previous diet.                           - Continue present medications.                           - Await pathology  results. Procedure Code(s):        --- Professional ---                           [phone], Esophagogastroduodenoscopy, flexible,                            transoral; with biopsy, single or multiple Diagnosis Code(s):        --- Professional ---                           K29.70, Gastritis, unspecified, without bleeding                           R13.10, Dysphagia, unspecified CPT copyright 2019 American Medical Association. All rights reserved. The codes documented in this report are preliminary and upon coder review may  be revised to meet current compliance requirements. Milus Banister, MD 05/10/2021 8:20:31 AM This report has been signed electronically. Number of Addenda: 0

## 2021-05-10 NOTE — Transfer of Care (Signed)
Immediate Anesthesia Transfer of Care Note  Patient: Michelle [name]  Procedure(s) Performed: COLONOSCOPY WITH PROPOFOL ESOPHAGOGASTRODUODENOSCOPY (EGD) WITH PROPOFOL BIOPSY  Patient Location: PACU  Anesthesia Type:MAC  Level of Consciousness: awake, alert  and oriented  Airway & Oxygen Therapy: Patient Spontanous Breathing and Patient connected to face mask oxygen  Post-op Assessment: Report given to RN, Post -op Vital signs reviewed and stable and Patient moving all extremities X 4  Post vital signs: Reviewed and stable  Last Vitals:  Vitals Value Taken Time  BP 121/67 05/10/21 0821  Temp    Pulse 95 05/10/21 0821  Resp 16 05/10/21 0821  SpO2 100 % 05/10/21 0821  Vitals shown include unvalidated device data.  Last Pain:  Vitals:   05/10/21 0732  TempSrc: Oral  PainSc: 0-No pain         Complications: No notable events documented.

## 2021-05-10 NOTE — Anesthesia Postprocedure Evaluation (Signed)
Anesthesia Post Note  Patient: Michelle [name]  Procedure(s) Performed: COLONOSCOPY WITH PROPOFOL ESOPHAGOGASTRODUODENOSCOPY (EGD) WITH PROPOFOL BIOPSY     Patient location during evaluation: PACU Anesthesia Type: MAC Level of consciousness: awake and alert Pain management: pain level controlled Vital Signs Assessment: post-procedure vital signs reviewed and stable Respiratory status: spontaneous breathing, nonlabored ventilation, respiratory function stable and patient connected to nasal cannula oxygen Cardiovascular status: stable and blood pressure returned to baseline Postop Assessment: no apparent nausea or vomiting Anesthetic complications: no   No notable events documented.  Last Vitals:  Vitals:   05/10/21 0840 05/10/21 0844  BP: 129/79   Pulse: 85 89  Resp: 17 (!) 23  Temp:    SpO2: 97% 98%    Last Pain:  Vitals:   05/10/21 0732  TempSrc: Oral  PainSc: 0-No pain                 Trevino Wyatt S

## 2021-05-10 NOTE — Anesthesia Procedure Notes (Signed)
Procedure Name: MAC Date/Time: 05/10/2021 7:46 AM Performed by: Niel Hummer, CRNA Pre-anesthesia Checklist: Patient identified, Emergency Drugs available, Suction available and Patient being monitored Oxygen Delivery Method: Simple face mask

## 2021-05-11 ENCOUNTER — Encounter (HOSPITAL_COMMUNITY): Payer: Self-pay | Admitting: Gastroenterology

## 2021-05-11 LAB — SURGICAL PATHOLOGY

## 2021-05-14 ENCOUNTER — Telehealth: Payer: Self-pay

## 2021-05-14 NOTE — Telephone Encounter (Signed)
Pt requesting refill with 90 day supply. Ok to refill?

## 2021-05-15 MED ORDER — HYDROXYZINE HCL 25 MG PO TABS: ORAL_TABLET | ORAL | 1 refills | Status: DC

## 2021-05-15 NOTE — Addendum Note (Signed)
Addended by: Harriett Sine on: 05/15/2021 12:55 PM   Modules accepted: Orders

## 2021-05-17 NOTE — Progress Notes (Signed)
Assessment/Plan:    1.  Essential Tremor  -we discussed taking primidone, 50 mg, 2 in the AM and 1 at noon instead of bed or just increasing 50 mg, 2 in the AM, 1 at noon, 1 in the evening.  She would like to do it this way.  -This is made worse by medication for her lungs.  2.  B12 deficiency  -On oral supplementation.  When she has stopped her supplement in the past, levels went back down.  3.  Memory loss, by history  -Patient had neurocognitive testing in July, 2021 with Dr. Melvyn Novas.  This was normal from a neurologic standpoint.  Dr. Melvyn Novas did mention that there was some evidence that her difficulties were caused by chronic pain, medication side effects (#120 percocet per month per pdmp) and generalized anxiety.  She and I discussed this in detail again today as she was very worried.  4.  LBP  -discussed with her she needs to f/u with Dr. Kathyrn Sheriff  Subjective:   Letta Kocher was seen today in follow up for essential tremor and memory change.  My previous records were reviewed prior to todays visit.  Tremor is a bit worse.  She is taking primidone 100 mg in the morning and 50 mg at bedtime.  She states that she takes 2 primidone in the AM but by noon, she feels that it "wears off."  She states that its not only that "its my memory."  Current prescribed movement disorder medications: Primidone, 50 mg, 2 tablets in the morning and 1 tablet in the evening B12 supplement    ALLERGIES:   Allergies  Allergen Reactions   Zofran [Ondansetron Hcl] Other (See Comments)    Prolong QT   Ace Inhibitors Cough    Occurred with lisinopril   Levofloxacin Other (See Comments)    Felt things on her legs that were not there,made her stomach hurt   Lisinopril Cough   Sulfonamide Derivatives Hives and Itching    CURRENT MEDICATIONS:  Outpatient Encounter Medications as of 05/21/2021  Medication Sig   acetaminophen (TYLENOL) 500 MG tablet Take 1,000 mg by mouth every 6 (six) hours as  needed for moderate pain or headache.   Ascorbic Acid (VITAMIN C) 1000 MG tablet Take 1,000 mg by mouth daily.   betamethasone dipropionate 0.05 % lotion Apply topically as directed. Apply to the scalp daily up to 5 days for scalp (Patient taking differently: Apply 1 application topically daily as needed (scalp irritation). Apply to the scalp daily up to 5 days for scalp)   Blood Glucose Monitoring Suppl (ONETOUCH VERIO) w/Device KIT Check blood glucose once a day   chlorhexidine (PERIDEX) 0.12 % solution Use as directed 15 mLs in the mouth or throat 2 (two) times daily. (Patient taking differently: Use as directed 15 mLs in the mouth or throat 2 (two) times daily as needed (mouth sores).)   conjugated estrogens (PREMARIN) vaginal cream Place 1 Applicatorful vaginally daily. (Patient taking differently: Place 1 Applicatorful vaginally once a week.)   cromolyn (OPTICROM) 4 % ophthalmic solution Place 1 drop into both eyes 3 (three) times daily as needed (itchy eyes).   cyclobenzaprine (FLEXERIL) 10 MG tablet Take 10 mg by mouth at bedtime as needed for muscle spasms.    cyclobenzaprine (FLEXERIL) 10 MG tablet Take 1 tablet by mouth 3 (three) times daily.   diclofenac Sodium (VOLTAREN) 1 % GEL Apply topically.   Dulaglutide 3 MG/0.5ML SOPN Inject 3 mg into the skin  once a week.   fluconazole (DIFLUCAN) 150 MG tablet Take 150 mg by mouth once.   fluticasone (FLONASE) 50 MCG/ACT nasal spray PLACE 2 SPRAYS INTO BOTH NOSTRILS DAILY AS NEEDED FOR ALLERGIES.   gabapentin (NEURONTIN) 300 MG capsule TAKE 1 CAPSULE BY MOUTH EVERYDAY AT BEDTIME   hydrOXYzine (ATARAX/VISTARIL) 25 MG tablet Take 1-2 tablets at bedtime as needed for itch. MAY MAKE DROWSY - DO NOT DRIVE AFTER TAKING   inFLIXimab (REMICADE) 100 MG injection Inject 100 mg into the vein every 7 (seven) weeks.    leflunomide (ARAVA) 10 MG tablet Take 10 mg by mouth daily.   losartan (COZAAR) 25 MG tablet TAKE 1/2 TABLET BY MOUTH ONCE DAILY   metFORMIN  (GLUCOPHAGE) 1000 MG tablet Take 1 tablet (1,000 mg total) by mouth 2 (two) times daily with a meal.   mometasone (ELOCON) 0.1 % cream Apply 1 application topically 2 (two) times daily as needed (sores).    mometasone-formoterol (DULERA) 100-5 MCG/ACT AERO Inhale 2 puffs into the lungs 2 (two) times daily.   montelukast (SINGULAIR) 10 MG tablet Take 10 mg by mouth daily.   MYRBETRIQ 25 MG TB24 tablet Take 25 mg by mouth daily.   nirmatrelvir & ritonavir (PAXLOVID, 300/100,) 20 x 150 MG & 10 x 100MG TBPK Take 3 tablets by mouth twice daily x 5 days   nitroGLYCERIN (NITROSTAT) 0.4 MG SL tablet Place 1 tablet (0.4 mg total) under the tongue every 5 (five) minutes as needed.   omeprazole (PRILOSEC) 40 MG capsule TAKE 1 CAPSULE BY MOUTH TWICE A DAY   ondansetron (ZOFRAN ODT) 4 MG disintegrating tablet Take 1 tablet (4 mg total) by mouth every 8 (eight) hours as needed for nausea or vomiting.   ONETOUCH VERIO test strip PLEASE USE TO CHECK BLOOD GLUCOSE ONCE DAILY.   oxyCODONE-acetaminophen (PERCOCET) 10-325 MG tablet Take 1 tablet by mouth every 6 (six) hours as needed for pain.   primidone (MYSOLINE) 50 MG tablet TAKE 2 TABLETS BY MOUTH ONCE DAILY IN THE MORNING AND 1 IN THE EVENING   rosuvastatin (CRESTOR) 10 MG tablet TAKE 1 TABLET BY MOUTH EVERY DAY   sertraline (ZOLOFT) 50 MG tablet TAKE 1 TABLET BY MOUTH EVERY DAY   VENTOLIN HFA 108 (90 Base) MCG/ACT inhaler INHALE 2 PUFFS BY MOUTH EVERY 4 HOURS AS NEEDED FOR WHEEZING OR SHORTNESS OF BREATH   vitamin B-12 (CYANOCOBALAMIN) 1000 MCG tablet Take 1,000 mcg by mouth daily.   oxyCODONE-acetaminophen (PERCOCET) 10-325 MG tablet Take 1 tablet by mouth every 6 (six) hours as needed for pain. (Patient not taking: No sig reported)   [DISCONTINUED] cetirizine (EQ ALLERGY RELIEF, CETIRIZINE,) 10 MG tablet Take 1 tablet (10 mg total) by mouth at bedtime. (Patient not taking: Reported on 05/21/2021)   [DISCONTINUED] diclofenac Sodium (VOLTAREN) 1 % GEL Apply 1  application topically 4 (four) times daily as needed (pain).   No facility-administered encounter medications on file as of 05/21/2021.     Objective:    PHYSICAL EXAMINATION:    VITALS:   Vitals:   05/21/21 0822  BP: 132/76  Pulse: 86  SpO2: 96%  Weight: 134 lb 12.8 oz (61.1 kg)  Height: 5' 2"  (1.575 m)   Wt Readings from Last 3 Encounters:  05/21/21 134 lb 12.8 oz (61.1 kg)  05/10/21 132 lb (59.9 kg)  04/30/21 132 lb 12.8 oz (60.2 kg)     GEN:  The patient appears stated age and is in NAD. HEENT:  Normocephalic, atraumatic.  The mucous  membranes are moist. The superficial temporal arteries are without ropiness or tenderness. CV:  RRR Lungs:  CTAB Neck/HEME:  There are no carotid bruits bilaterally.  Neurological examination:  Orientation: The patient is alert and oriented x3. Cranial nerves: There is good facial symmetry. The speech is fluent and clear. Soft palate rises symmetrically and there is no tongue deviation. Hearing is intact to conversational tone. Sensation: Sensation is intact to light touch throughout Motor: Strength is at least antigravity x4.  Movement examination: Tone: There is normal tone in the UE/LE  Abnormal movements: there is no rest tremor.  There is mild to mod postural and intention tremor on the L Coordination:  There is no decremation with RAM's  I have reviewed and interpreted the following labs independently   Chemistry      Component Value Date/Time   NA 138 08/31/2020 1033   K 4.1 08/31/2020 1033   CL 101 08/31/2020 1033   CO2 19 (L) 08/31/2020 1033   BUN 15 08/31/2020 1033   CREATININE 0.70 08/31/2020 1033   CREATININE 0.46 (L) 07/22/2016 1026      Component Value Date/Time   CALCIUM 9.6 08/31/2020 1033   ALKPHOS 109 08/31/2020 1033   AST 21 08/31/2020 1033   ALT 16 08/31/2020 1033   BILITOT 0.2 08/31/2020 1033      Lab Results  Component Value Date   WBC 8.7 02/28/2021   HGB 12.0 02/28/2021   HCT 37.7  02/28/2021   MCV 92 02/28/2021   PLT 259 02/28/2021   Lab Results  Component Value Date   TSH 2.360 12/06/2020     Chemistry      Component Value Date/Time   NA 138 08/31/2020 1033   K 4.1 08/31/2020 1033   CL 101 08/31/2020 1033   CO2 19 (L) 08/31/2020 1033   BUN 15 08/31/2020 1033   CREATININE 0.70 08/31/2020 1033   CREATININE 0.46 (L) 07/22/2016 1026      Component Value Date/Time   CALCIUM 9.6 08/31/2020 1033   ALKPHOS 109 08/31/2020 1033   AST 21 08/31/2020 1033   ALT 16 08/31/2020 1033   BILITOT 0.2 08/31/2020 1033         Total time spent on today's visit was 20 minutes, including both face-to-face time and nonface-to-face time.  Time included that spent on review of records (prior notes available to me/labs/imaging if pertinent), discussing treatment and goals, answering patient's questions and coordinating care.  Cc:  [name], MD

## 2021-05-18 ENCOUNTER — Ambulatory Visit (INDEPENDENT_AMBULATORY_CARE_PROVIDER_SITE_OTHER): Payer: Medicare Other | Admitting: Podiatry

## 2021-05-18 ENCOUNTER — Other Ambulatory Visit: Payer: Self-pay

## 2021-05-18 DIAGNOSIS — E119 Type 2 diabetes mellitus without complications: Secondary | ICD-10-CM

## 2021-05-18 DIAGNOSIS — B351 Tinea unguium: Secondary | ICD-10-CM

## 2021-05-18 DIAGNOSIS — M79674 Pain in right toe(s): Secondary | ICD-10-CM | POA: Diagnosis not present

## 2021-05-18 DIAGNOSIS — M79675 Pain in left toe(s): Secondary | ICD-10-CM

## 2021-05-18 DIAGNOSIS — L84 Corns and callosities: Secondary | ICD-10-CM | POA: Diagnosis not present

## 2021-05-18 DIAGNOSIS — M461 Sacroiliitis, not elsewhere classified: Secondary | ICD-10-CM | POA: Insufficient documentation

## 2021-05-18 NOTE — Progress Notes (Signed)
  Subjective:  Patient ID: Michelle [name], female    DOB: [birth year],  MRN: [id number]  Michelle [name] presents to clinic today for at risk foot care with history of diabetic neuropathy and corn(s) left 3rd toe and painful thick toenails that are difficult to trim. Painful toenails interfere with ambulation. Aggravating factors include wearing enclosed shoe gear. Pain is relieved with periodic professional debridement. Painful corns are aggravated when weightbearing when wearing enclosed shoe gear. Pain is relieved with periodic professional debridement.  Patient does not monitor blood glucose on a regular basis.  PCP is [name], MD , and last visit was 02/28/2021.  Allergies  Allergen Reactions   Zofran [Ondansetron Hcl] Other (See Comments)    Prolong QT   Ace Inhibitors Cough    Occurred with lisinopril   Levofloxacin Other (See Comments)    Felt things on her legs that were not there,made her stomach hurt   Lisinopril Cough   Sulfonamide Derivatives Hives and Itching    Review of Systems: Negative except as noted in the HPI. Objective:   Constitutional Michelle [name] is a pleasant 70 y.o. Caucasian female, WD, WN in NAD. AAO x 3.   Vascular CFT immediate b/l LE. Palpable DP/PT pulses b/l LE. Digital hair sparse b/l. Skin temperature gradient WNL b/l. No pain with calf compression b/l. No edema noted b/l. No cyanosis or clubbing noted b/l LE. No cyanosis or clubbing noted.  Neurologic Normal speech. Oriented to person, place, and time. Protective sensation intact 5/5 intact bilaterally with 10g monofilament b/l. Vibratory sensation intact b/l.  Dermatologic Pedal integument with normal turgor, texture and tone b/l LE. No open wounds b/l. No interdigital macerations b/l. Toenails 1-5 b/l elongated, thickened, discolored with subungual debris. +Tenderness with dorsal palpation of nailplates. Hyperkeratotic lesion(s) noted L 3rd toe.  Orthopedic: Muscle strength 5/5 to all lower  extremity muscle groups bilaterally. HAV with bunion deformity noted b/l LE. Hammertoe(s) noted to the L 3rd toe.   Radiographs: None Assessment:   1. Pain due to onychomycosis of toenails of both feet   2. Corns   3. Diabetes mellitus without complication ([place])    Plan:  Patient was evaluated and treated and all questions answered. Consent given for treatment as described below: -Examined patient. -Discussed padding for left 3rd digit. She will purchase toe caps from her Dr. Samul Dada catalog or [employer] Apply to left 3rd digit every morning. Removie every evening. -Continue diabetic foot care principles: inspect feet daily, monitor glucose as recommended by PCP and/or Endocrinologist, and follow prescribed diet per PCP, Endocrinologist and/or dietician. -Mycotic toenails 1-5 bilaterally were debrided in length and girth with sterile nail nippers and dremel without incident. -Corn(s) L 3rd toe pared utilizing sterile scalpel blade without complication or incident. Total number debrided=1. -Patient/POA to call should there be question/concern in the interim.  Return in about 3 months (around 08/18/2021).  Marzetta Board, DPM

## 2021-05-21 ENCOUNTER — Ambulatory Visit (INDEPENDENT_AMBULATORY_CARE_PROVIDER_SITE_OTHER): Payer: Medicare Other | Admitting: Neurology

## 2021-05-21 ENCOUNTER — Other Ambulatory Visit: Payer: Self-pay

## 2021-05-21 ENCOUNTER — Encounter: Payer: Self-pay | Admitting: Neurology

## 2021-05-21 VITALS — BP 132/76 | HR 86 | Ht 62.0 in | Wt 134.8 lb

## 2021-05-21 DIAGNOSIS — R251 Tremor, unspecified: Secondary | ICD-10-CM | POA: Diagnosis not present

## 2021-05-21 DIAGNOSIS — R413 Other amnesia: Secondary | ICD-10-CM | POA: Diagnosis not present

## 2021-05-21 MED ORDER — PRIMIDONE 50 MG PO TABS
ORAL_TABLET | ORAL | 2 refills | Status: DC
Start: 2021-05-21 — End: 2022-02-11

## 2021-05-21 NOTE — Patient Instructions (Signed)
Increase primidone, 50 mg, 2 in the AM, 1 at noon, 1 in the evening.   Make an appointment with Dr. Kathyrn Sheriff to discuss your back pain  Your memory issues are not from a neurodegenerative process like Alzheimers, but rather from your medications.

## 2021-05-22 ENCOUNTER — Encounter: Payer: Self-pay | Admitting: Podiatry

## 2021-05-29 NOTE — Progress Notes (Signed)
    SUBJECTIVE:   CHIEF COMPLAINT / HPI:   T2DM Reports good adherence to medications. Was having a lot of diarrhea but this is improved with loperamide daily as needed.  Chronic pain Secondary to RA. Takes oxycodone-acetaminophen q6h prn, takes most of these doses. Has been on this for several years. Cyclobenzaprine rarely maybe once a week. Gabapentin nightly. Feels she needs all of these medications.  PERTINENT  PMH / PSH: T2DM, RA, chronic pain, HLD, GAD  OBJECTIVE:   BP 139/78   Pulse 90   Ht 5\' 2"  (1.575 m)   Wt 136 lb (61.7 kg)   SpO2 98%   BMI 24.87 kg/m   General: alert elderly female, NAD CV: RRR, no murmurs Pulm: CTAB, no wheezes or rales  Lab Results  Component Value Date   HGBA1C 6.8 05/30/2021     ASSESSMENT/PLAN:   Encounter for chronic pain management Continue pain control regimen. PDMP reviewed. Oxycodone-acetaminophen refilled for additional 3 months. Advised to reduce doses if able.  Type II diabetes mellitus Well-controlled, improving. Will decrease metformin to 1000 mg daily to reduce diarrhea. Continue GLP1. Follow-up 3 months.     [name], MD [place]

## 2021-05-30 ENCOUNTER — Encounter: Payer: Self-pay | Admitting: Family Medicine

## 2021-05-30 ENCOUNTER — Ambulatory Visit (INDEPENDENT_AMBULATORY_CARE_PROVIDER_SITE_OTHER): Payer: Medicare Other | Admitting: Family Medicine

## 2021-05-30 ENCOUNTER — Other Ambulatory Visit: Payer: Self-pay

## 2021-05-30 VITALS — BP 139/78 | HR 90 | Ht 62.0 in | Wt 136.0 lb

## 2021-05-30 DIAGNOSIS — E1142 Type 2 diabetes mellitus with diabetic polyneuropathy: Secondary | ICD-10-CM | POA: Diagnosis not present

## 2021-05-30 DIAGNOSIS — E119 Type 2 diabetes mellitus without complications: Secondary | ICD-10-CM

## 2021-05-30 DIAGNOSIS — G8929 Other chronic pain: Secondary | ICD-10-CM | POA: Diagnosis not present

## 2021-05-30 LAB — POCT GLYCOSYLATED HEMOGLOBIN (HGB A1C): HbA1c, POC (controlled diabetic range): 6.8 % (ref 0.0–7.0)

## 2021-05-30 MED ORDER — OXYCODONE-ACETAMINOPHEN 10-325 MG PO TABS: 1.0000 | ORAL_TABLET | Freq: Four times a day (QID) | ORAL | 0 refills | Status: DC | PRN

## 2021-05-30 MED ORDER — METFORMIN HCL 1000 MG PO TABS
1000.0000 mg | ORAL_TABLET | Freq: Every day | ORAL | 3 refills | Status: DC
Start: 2021-05-30 — End: 2021-08-20

## 2021-05-30 NOTE — Assessment & Plan Note (Addendum)
Continue pain control regimen. PDMP reviewed. Oxycodone-acetaminophen refilled for additional 3 months. Advised to reduce doses if able.

## 2021-05-30 NOTE — Patient Instructions (Addendum)
It was nice seeing you today!  Great job with your diabetes!  Cut back your metformin to 1000 mg a day. Try to take the oxycodone less often if you're able to.  Follow-up in 3 months.  Please arrive at least 15 minutes prior to your scheduled appointments.  Stay well, [name], MD [place] [phone]

## 2021-05-30 NOTE — Assessment & Plan Note (Signed)
Well-controlled, improving. Will decrease metformin to 1000 mg daily to reduce diarrhea. Continue GLP1. Follow-up 3 months.

## 2021-06-08 DIAGNOSIS — M059 Rheumatoid arthritis with rheumatoid factor, unspecified: Secondary | ICD-10-CM | POA: Diagnosis not present

## 2021-06-08 DIAGNOSIS — Z79899 Other long term (current) drug therapy: Secondary | ICD-10-CM | POA: Diagnosis not present

## 2021-06-12 ENCOUNTER — Telehealth: Payer: Self-pay

## 2021-06-12 MED ORDER — OXYCODONE-ACETAMINOPHEN 10-325 MG PO TABS: 1.0000 | ORAL_TABLET | Freq: Four times a day (QID) | ORAL | 0 refills | Status: DC | PRN

## 2021-06-12 NOTE — Telephone Encounter (Signed)
Pharmacy calls nurse line reporting they did not receive December scheduled pain medication. Pharmacy reports they received January and February, however the computer must have "lost" December prescription.   Please resend Oxycodone with December start date of 12/15.

## 2021-06-14 DIAGNOSIS — M47816 Spondylosis without myelopathy or radiculopathy, lumbar region: Secondary | ICD-10-CM | POA: Diagnosis not present

## 2021-07-03 ENCOUNTER — Telehealth: Payer: Self-pay

## 2021-07-03 ENCOUNTER — Telehealth: Payer: Self-pay | Admitting: Family Medicine

## 2021-07-03 NOTE — Telephone Encounter (Signed)
Patient dropped off form at front desk for Michelle [name] .  Verified that patient section of form has been completed.  Last DOS/WCC with PCP was 05/30/21.  Placed form in blue team folder to be completed by clinical staff.  Creig Hines

## 2021-07-03 NOTE — Telephone Encounter (Signed)
Patient calls nurse line requesting an excuse for jury duty. Patient states she is set to report on 07/11/2021. Patient reports she doesn't feel she can sit for a lengthily period of time without bathroom breaks. Patient also reports sitting for too long aggravates her hip pain.   Will forward to PCP for advisement.

## 2021-07-03 NOTE — Telephone Encounter (Signed)
Unfortunately I do not think that is a medical indication to be excused from jury duty. They should be able to accommodate her need to use the restroom frequently and offer standing breaks. If she needs a letter for this, we can write her one.

## 2021-07-04 NOTE — Telephone Encounter (Signed)
Clinical info completed on handicap sticker form.  Place form in Dr. Lynnda Child box for completion.  Tymothy Cass, CMA

## 2021-07-04 NOTE — Telephone Encounter (Signed)
Patient has been contacted and informed. Patient was not pleased, however declined to make an apt to discuss.

## 2021-07-05 NOTE — Telephone Encounter (Signed)
Patient is aware that form is ready for pick up. Merita Hawks,CMA  

## 2021-07-05 NOTE — Telephone Encounter (Signed)
Form completed and placed in RN box

## 2021-07-09 ENCOUNTER — Ambulatory Visit (INDEPENDENT_AMBULATORY_CARE_PROVIDER_SITE_OTHER): Payer: Medicare HMO | Admitting: Podiatry

## 2021-07-09 ENCOUNTER — Ambulatory Visit (INDEPENDENT_AMBULATORY_CARE_PROVIDER_SITE_OTHER): Payer: Medicare HMO

## 2021-07-09 ENCOUNTER — Other Ambulatory Visit: Payer: Self-pay

## 2021-07-09 DIAGNOSIS — M79672 Pain in left foot: Secondary | ICD-10-CM

## 2021-07-09 NOTE — Progress Notes (Signed)
HPI: 71 y.o. female presenting today for new complaint regarding minimal pain and tenderness associated to the left plantar heel only when the patient walks barefoot.  Patient states that when she walks on hardwood floors but shoes she feels a pop in her left plantar heel with some mild pain.  She denies a history of injury.  She presents for further treatment and evaluation  Past Medical History:  Diagnosis Date   Actinic keratosis    Allergic rhinitis 08/28/2006   Anemia    low iron   Asthma, chronic 03/14/2010   Does seem to use her albuterol excessively. Consider change to more manageable medicines and treat more like COPD in the future.   Carpal tunnel syndrome, left 05/29/2009   Last Assessment & Plan:  Advised to wear wrist brace at night when she is sleeping.  If this does not help will consider having her go to sports medicine.   Chronic frontal sinusitis 08/28/2016   Chronic left maxillary sinusitis 08/28/2016   Diverticulosis    Essential hypertension 10/30/2012   Gastroparesis 11/19/2011   DM and chronic narcotics contribute.  Causes dysphagia like symptoms    Generalized anxiety disorder 08/28/2006   GERD (gastroesophageal reflux disease)    Hearing loss 07/19/2015   Hearing aid in right ear   History of cataracts, bilateral    Hyperlipidemia 08/28/2006   Lumbar radiculopathy 04/07/2018   Lung nodules    Osteoporosis 08/08/2011   Pain in joint, pelvic region and thigh 11/18/2012   Radial styloid tenosynovitis 08/15/2013   Restless leg syndrome 05/14/2012   Seropositive rheumatoid arthritis 01/08/2010   Patient is being followed by rheumatologist at [facility] patient is on Plaquenil and as well as prednisone.   Changed to methotrexate and prednisone in May of 2013  Patient does have a pain contract here with [facility] receives oxycodone 10/325 mg tabs every 6 hours. This has been titrated down from significant doses of methadone previously.  Could attempt to tit   Shoulder joint pain 03/25/2011   The patient is compensating due to her not being able to use her left arm right now rotator cuff impingement if he continues to worsen would not be surprised if she does get a tear on this side as well   Squamous cell carcinoma in situ of skin 11/25/2014   Referred to dermatology for excision    Squamous cell carcinoma of skin 12/26/2014   Right distal pretibial. SCC-KA pattern.   Squamous cell carcinoma of skin 03/27/2015   Right inf. lat. knee. SCC-KA pattern.    Squamous cell carcinoma of skin 10/07/2016   Left lateral calf superior. WD SCC.   Squamous cell carcinoma of skin 10/07/2016   Left lateral calf inferior. WD SCC.   Squamous cell carcinoma of skin 04/28/2018   Left mid med. pretibial. WD SCC.   Squamous cell carcinoma of skin 08/12/2019   Left mid med. pretibial sup. KA type. EDC   Squamous cell carcinoma of skin 08/12/2019   Left mid med. pretibial inferior. WD. EDC.   Tear of medial meniscus of knee, left 04/27/2009   Type II diabetes mellitus 08/28/2006   Overview:  Patient has her ups and downs, go in line with flares of her RA and prednisone uses.  Patient does have hx of hypoglycemic events so would not try for perfect control but should have A1c goal around 7.0 Lab Results  Component Value Date   HGBA1C 6.9 12/27/2011  Last Assessment & Plan:  At goal. Will continue current metformin    Past Surgical History:  Procedure Laterality Date   ABDOMINAL HYSTERECTOMY     APPENDECTOMY     BIOPSY  05/10/2021   Procedure: BIOPSY;  Surgeon: Milus Banister, MD;  Location: [facility];  Service: Endoscopy;;  EGD and COLON   CARPAL TUNNEL RELEASE Right    CATARACT EXTRACTION Left    with lid lift   CHOLECYSTECTOMY     COLONOSCOPY     COLONOSCOPY WITH PROPOFOL N/A 05/10/2021   Procedure: COLONOSCOPY WITH PROPOFOL;  Surgeon: Milus Banister, MD;  Location: [facility];  Service: Endoscopy;  Laterality: N/A;    ESOPHAGOGASTRODUODENOSCOPY (EGD) WITH PROPOFOL N/A 05/10/2021   Procedure: ESOPHAGOGASTRODUODENOSCOPY (EGD) WITH PROPOFOL;  Surgeon: Milus Banister, MD;  Location: [facility];  Service: Endoscopy;  Laterality: N/A;   HERNIA REPAIR     KNEE SURGERY Right    x2- cartilage annd scar tissue   LEFT AND RIGHT HEART CATHETERIZATION WITH CORONARY ANGIOGRAM N/A 08/17/2013   Procedure: LEFT AND RIGHT HEART CATHETERIZATION WITH CORONARY ANGIOGRAM;  Surgeon: Laverda Page, MD;  Location: [facility] CATH LAB;  Service: Cardiovascular;  Laterality: N/A;   lumbar back surgery  04/04/2018   done by neurosurgeron   right foot bone spurs removed     ROTATOR CUFF REPAIR Right    SHOULDER SURGERY Left    x2- rotatar cuff tear   UPPER GASTROINTESTINAL ENDOSCOPY      Allergies  Allergen Reactions   Zofran [Ondansetron Hcl] Other (See Comments)    Prolong QT   Ace Inhibitors Cough    Occurred with lisinopril   Levofloxacin Other (See Comments)    Felt things on her legs that were not there,made her stomach hurt   Lisinopril Cough   Sulfonamide Derivatives Hives and Itching     Physical Exam: General: The patient is alert and oriented x3 in no acute distress.  Dermatology: Skin is warm, dry and supple bilateral lower extremities. Negative for open lesions or macerations.  Vascular: Palpable pedal pulses bilaterally. Capillary refill within normal limits.  Negative for any significant edema or erythema  Neurological: Light touch and protective threshold grossly intact  Musculoskeletal Exam: No pedal deformities noted.  There is some very mild tenderness with palpation to the left plantar heel  Radiographic Exam:  Normal osseous mineralization.  Heel spur noted on lateral view to the plantar aspect of the calcaneus.  Diffuse degenerative changes noted with periarticular spurring.  Assessment: 1.  Plantar heel pain left only when the patient is barefoot   Plan of Care:  1. Patient evaluated.  X-Rays reviewed.  2.  Advised against going barefoot.  Recommend good supportive shoes as much as reasonably possible.  Patient states that she has no symptoms or pain when she wear shoes 3.  Continue close management of her diabetes mellitus 4.  Return to clinic.      Edrick Kins, DPM [facility]  Dr. Edrick Kins, DPM    [address] [place],  [address]                Office [phone]  Fax [phone]

## 2021-07-16 ENCOUNTER — Telehealth: Payer: Self-pay

## 2021-07-16 MED ORDER — OXYCODONE-ACETAMINOPHEN 10-325 MG PO TABS: 1.0000 | ORAL_TABLET | Freq: Four times a day (QID) | ORAL | 0 refills | Status: DC | PRN

## 2021-07-16 NOTE — Telephone Encounter (Signed)
[facility] calls nurse line reporting they are having "system" issues with prescriptions. Pharmacy reports they received Oxycodone prescription today, however the "full" prescription did not transmit. Pharmacy is asking for PCP to resend prescription. Pharmacy has cancelled original.

## 2021-07-16 NOTE — Telephone Encounter (Signed)
PDMP reviewed. Rx refilled.   Ante Arredondo, MD  Family Medicine Teaching Service   

## 2021-07-16 NOTE — Telephone Encounter (Signed)
Pharmacy calls nurse line again. The problem is not with their system as previously stated, the problem is PCPs resident status.   Will forward to afternoon preceptor to fill.

## 2021-07-17 ENCOUNTER — Other Ambulatory Visit: Payer: Self-pay

## 2021-07-17 ENCOUNTER — Telehealth: Payer: Self-pay

## 2021-07-17 NOTE — Telephone Encounter (Signed)
Patient approached me in my office stating Oxycodone needs a PA. I was unable to complete this via covermymeds and had to contact her insurance.   PA completed over the phone. PA approved through 06/30/2022.  The pharmacy has been updated.

## 2021-07-20 ENCOUNTER — Other Ambulatory Visit: Payer: Self-pay | Admitting: Family Medicine

## 2021-07-20 DIAGNOSIS — Z1231 Encounter for screening mammogram for malignant neoplasm of breast: Secondary | ICD-10-CM

## 2021-07-30 DIAGNOSIS — M47816 Spondylosis without myelopathy or radiculopathy, lumbar region: Secondary | ICD-10-CM | POA: Diagnosis not present

## 2021-08-03 DIAGNOSIS — M059 Rheumatoid arthritis with rheumatoid factor, unspecified: Secondary | ICD-10-CM | POA: Diagnosis not present

## 2021-08-04 ENCOUNTER — Other Ambulatory Visit: Payer: Self-pay | Admitting: Dermatology

## 2021-08-06 ENCOUNTER — Telehealth: Payer: Self-pay

## 2021-08-06 ENCOUNTER — Other Ambulatory Visit: Payer: Self-pay

## 2021-08-06 MED ORDER — GABAPENTIN 300 MG PO CAPS: ORAL_CAPSULE | ORAL | 3 refills | Status: DC

## 2021-08-06 NOTE — Telephone Encounter (Signed)
Patient calls nurse line reporting possible UTI or yeast symptoms. Patient reports itching and foul smelling urine odor. Patient denies any discharge, dysuria or flank pain. Patient admits not drinking water at all. Patient drinks V8 and propels.   Patient offered an apt, however denied at this time. Patient advised to drink plenty of water and to call back for an apt if symptoms worsen or do not improve.   Red flags discussed with patient.

## 2021-08-09 ENCOUNTER — Telehealth: Payer: Self-pay

## 2021-08-09 MED ORDER — FLUTICASONE FUROATE-VILANTEROL 100-25 MCG/ACT IN AEPB: 1.0000 | INHALATION_SPRAY | Freq: Every day | RESPIRATORY_TRACT | 2 refills | Status: DC

## 2021-08-09 NOTE — Telephone Encounter (Signed)
Will change Dulera to fluticasone furoate-vilanterol (BREO ELLIPTA) which is an equivalent medication. Please let her know, thank you

## 2021-08-09 NOTE — Telephone Encounter (Signed)
Patient calls nurse line requesting PA on [facility]. Attempted to complete via covermymeds, however, was unable to do so, as PA was already in process.   Called [employer]. They report that drug was denied yesterday for not meeting plan required criteria. You can either send in an alternative or call [phone] to start an appeal.   Please see below alternatives.   -Advair HFA 115-21 Mcg/Actuation HFAA -Albuterol Sulfate (HFAA) -Arnuity Ellipta 100 Mcg/Actuation Dsdv  -Combivent Respimat  -Flovent HFA 110 Mcg/Actuation HFAA -Fluticasone Furoate-Vilanterol 100-25 Mcg/Dose Dsdv  -Fluticasone Propion-Salmeterol 250-50 Mcg/Dose Dsdv   Please advise.   Talbot Grumbling, RN

## 2021-08-10 NOTE — Telephone Encounter (Signed)
Pharmacy called and verified that medication went through insurance. Per pharmacist, medication ran through with no issues.   Called patient and provided with update.   Talbot Grumbling, RN

## 2021-08-19 ENCOUNTER — Other Ambulatory Visit: Payer: Self-pay | Admitting: Family Medicine

## 2021-08-20 ENCOUNTER — Other Ambulatory Visit: Payer: Self-pay

## 2021-08-20 DIAGNOSIS — E1142 Type 2 diabetes mellitus with diabetic polyneuropathy: Secondary | ICD-10-CM

## 2021-08-20 MED ORDER — LOSARTAN POTASSIUM 25 MG PO TABS: ORAL_TABLET | ORAL | 3 refills | Status: DC

## 2021-08-20 MED ORDER — SERTRALINE HCL 50 MG PO TABS: 50.0000 mg | ORAL_TABLET | Freq: Every day | ORAL | 3 refills | Status: DC

## 2021-08-20 MED ORDER — METFORMIN HCL 1000 MG PO TABS: 1000.0000 mg | ORAL_TABLET | Freq: Every day | ORAL | 3 refills | Status: DC

## 2021-08-24 ENCOUNTER — Other Ambulatory Visit: Payer: Self-pay

## 2021-08-24 ENCOUNTER — Ambulatory Visit (INDEPENDENT_AMBULATORY_CARE_PROVIDER_SITE_OTHER): Payer: Medicare HMO | Admitting: Podiatry

## 2021-08-24 ENCOUNTER — Encounter: Payer: Self-pay | Admitting: Podiatry

## 2021-08-24 DIAGNOSIS — L84 Corns and callosities: Secondary | ICD-10-CM

## 2021-08-24 DIAGNOSIS — M79674 Pain in right toe(s): Secondary | ICD-10-CM | POA: Diagnosis not present

## 2021-08-24 DIAGNOSIS — B351 Tinea unguium: Secondary | ICD-10-CM | POA: Diagnosis not present

## 2021-08-24 DIAGNOSIS — E119 Type 2 diabetes mellitus without complications: Secondary | ICD-10-CM | POA: Diagnosis not present

## 2021-08-24 DIAGNOSIS — M79675 Pain in left toe(s): Secondary | ICD-10-CM

## 2021-08-26 NOTE — Progress Notes (Signed)
° ° °  SUBJECTIVE:   CHIEF COMPLAINT / HPI:  Chief Complaint  Patient presents with   Follow-up    a1c    Malodorous urine ongoing for a few weeks. No dysuria. Has urinary frequency at baseline which is not worse. Urine is not dark.  T2DM Metformin decreased to 1000 mg daily at last visit. Also on dulaglutide 3 mg weekly.  Needs refill of her Percocet for chronic pain secondary to RA.  She did try to wean but was not able to due to pain.  PERTINENT  PMH / PSH: T2DM, RA, chronic pain, HLD, GAD  Patient Care Team: [name], MD as PCP - General (Family Medicine) Alda Berthold, DO as Consulting Physician (Neurology)   OBJECTIVE:   BP 136/85    Pulse 99    Ht (P) 5\' 2"  (1.575 m)    Wt (P) 136 lb 6 oz (61.9 kg)    SpO2 98%    BMI (P) 24.94 kg/m   Physical Exam Constitutional:      General: She is not in acute distress.    Appearance: Normal appearance. She is normal weight.  HENT:     Head: Normocephalic and atraumatic.  Eyes:     Extraocular Movements: Extraocular movements intact.  Cardiovascular:     Rate and Rhythm: Normal rate.  Pulmonary:     Effort: Pulmonary effort is normal. No respiratory distress.  Musculoskeletal:     Cervical back: Neck supple.  Neurological:     Mental Status: She is alert.     Depression screen [facility] 2/9 08/27/2021  Decreased Interest 0  Down, Depressed, Hopeless 0  PHQ - 2 Score 0  Altered sleeping 0  Tired, decreased energy 1  Change in appetite 0  Feeling bad or failure about yourself  0  Trouble concentrating 0  Moving slowly or fidgety/restless 0  Suicidal thoughts 0  PHQ-9 Score 1  Difficult doing work/chores Not difficult at all  Some recent data might be hidden     {Show previous vital signs (optional):23777}    ASSESSMENT/PLAN:   Type II diabetes mellitus Worse with decrease in metformin.  Per patient preference, will increase metformin back to 1000 mg twice daily.  Change to extended release to reduce GI side  effects.  Dulaglutide refilled.  Encounter for chronic pain management Continue pain control regimen.  PDMP reviewed.  Oxycodone-acetaminophen refilled for an additional 3 months.  Patient was not able to wean.  Malodorous urine No other signs of UTI.  Do not feel testing is necessary and patient in agreement.  HCM - shingles vaccine rx given - DM eye exam upcoming April  Return in about 3 months (around 11/24/2021) for f/u DM.   [name], MD [place]

## 2021-08-27 ENCOUNTER — Encounter: Payer: Self-pay | Admitting: Family Medicine

## 2021-08-27 ENCOUNTER — Other Ambulatory Visit: Payer: Self-pay

## 2021-08-27 ENCOUNTER — Other Ambulatory Visit: Payer: Self-pay | Admitting: Family Medicine

## 2021-08-27 ENCOUNTER — Ambulatory Visit (INDEPENDENT_AMBULATORY_CARE_PROVIDER_SITE_OTHER): Payer: Medicare HMO | Admitting: Family Medicine

## 2021-08-27 ENCOUNTER — Ambulatory Visit
Admission: RE | Admit: 2021-08-27 | Discharge: 2021-08-27 | Disposition: A | Discharge status: Home / Self Care | Payer: Medicare HMO | Source: Ambulatory Visit | Attending: *Deleted | Admitting: *Deleted

## 2021-08-27 VITALS — BP 136/85 | HR 99

## 2021-08-27 DIAGNOSIS — Z1231 Encounter for screening mammogram for malignant neoplasm of breast: Secondary | ICD-10-CM

## 2021-08-27 DIAGNOSIS — G8929 Other chronic pain: Secondary | ICD-10-CM

## 2021-08-27 DIAGNOSIS — E1142 Type 2 diabetes mellitus with diabetic polyneuropathy: Secondary | ICD-10-CM | POA: Diagnosis not present

## 2021-08-27 DIAGNOSIS — Z0000 Encounter for general adult medical examination without abnormal findings: Secondary | ICD-10-CM | POA: Diagnosis not present

## 2021-08-27 LAB — POCT GLYCOSYLATED HEMOGLOBIN (HGB A1C): HbA1c, POC (controlled diabetic range): 7.7 % — AB (ref 0.0–7.0)

## 2021-08-27 MED ORDER — ZOSTER VAC RECOMB ADJUVANTED 50 MCG/0.5ML IM SUSR: 0.5000 mL | Freq: Once | INTRAMUSCULAR | 0 refills | Status: AC

## 2021-08-27 MED ORDER — OXYCODONE-ACETAMINOPHEN 10-325 MG PO TABS: 1.0000 | ORAL_TABLET | Freq: Four times a day (QID) | ORAL | 0 refills | Status: DC | PRN

## 2021-08-27 MED ORDER — METFORMIN HCL ER 500 MG PO TB24: 1000.0000 mg | ORAL_TABLET | Freq: Two times a day (BID) | ORAL | 1 refills | Status: DC

## 2021-08-27 MED ORDER — DULAGLUTIDE 0.75 MG/0.5ML ~~LOC~~ SOAJ: 3.0000 mg | SUBCUTANEOUS | 3 refills | Status: DC

## 2021-08-27 MED ORDER — MONTELUKAST SODIUM 10 MG PO TABS: 10.0000 mg | ORAL_TABLET | Freq: Every day | ORAL | 0 refills | Status: DC

## 2021-08-27 NOTE — Patient Instructions (Addendum)
It was nice seeing you today!  Try this number for the lung doctor: [phone]  Increase your metformin to 1000 mg (2 pills) twice a day.  Continue taking Trulicity.  Get your shingles vaccine at [facility]. 2nd dose after 2-6 months.  Call our office if any issues.  Stay well, [name], MD [place] [phone]  --  Make sure to check out at the front desk before you leave today.  Please arrive at least 15 minutes prior to your scheduled appointments.  If you had blood work today, I will send you a MyChart message or a letter if results are normal. Otherwise, I will give you a call.  If you had a referral placed, they will call you to set up an appointment. Please give [place] a call if you don't hear back in the next 2 weeks.  If you need additional refills before your next appointment, please call your pharmacy first.

## 2021-08-27 NOTE — Assessment & Plan Note (Signed)
Worse with decrease in metformin.  Per patient preference, will increase metformin back to 1000 mg twice daily.  Change to extended release to reduce GI side effects.  Dulaglutide refilled.

## 2021-08-27 NOTE — Assessment & Plan Note (Signed)
Continue pain control regimen.  PDMP reviewed.  Oxycodone-acetaminophen refilled for an additional 3 months.  Patient was not able to wean.

## 2021-08-28 ENCOUNTER — Other Ambulatory Visit: Payer: Self-pay

## 2021-08-28 MED ORDER — FLUTICASONE FUROATE-VILANTEROL 100-25 MCG/ACT IN AEPB: INHALATION_SPRAY | RESPIRATORY_TRACT | 1 refills | Status: DC

## 2021-08-28 NOTE — Telephone Encounter (Signed)
Patient calls nurse line requesting refill on Breo inhalers. Patient is requesting 3 month supply as this is more cost efficient.   Please advise.   Talbot Grumbling, RN

## 2021-08-28 NOTE — Progress Notes (Signed)
°  Subjective:  Patient ID: Michelle [name], female    DOB: 05-15-1951,  MRN: [id number]  Michelle [name] presents to clinic today for at risk foot care with history of diabetic neuropathy and corn(s) left 3rd toe and painful thick toenails that are difficult to trim. Painful toenails interfere with ambulation. Aggravating factors include wearing enclosed shoe gear. Pain is relieved with periodic professional debridement. Painful corns are aggravated when weightbearing when wearing enclosed shoe gear. Pain is relieved with periodic professional debridement.  Patient does not monitor blood glucose on a regular basis.  PCP is [name], MD , and last visit was 05/30/2021  Allergies  Allergen Reactions   Zofran [Ondansetron Hcl] Other (See Comments)    Prolong QT   Ace Inhibitors Cough    Occurred with lisinopril   Levofloxacin Other (See Comments)    Felt things on her legs that were not there,made her stomach hurt   Lisinopril Cough   Sulfonamide Derivatives Hives and Itching    Review of Systems: Negative except as noted in the HPI. Objective:   Constitutional Michelle [name] is a pleasant 71 y.o. Caucasian female, WD, WN in NAD. AAO x 3.   Vascular CFT immediate b/l LE. Palpable DP/PT pulses b/l LE. Digital hair sparse b/l. Skin temperature gradient WNL b/l. No pain with calf compression b/l. No edema noted b/l. No cyanosis or clubbing noted b/l LE. No cyanosis or clubbing noted.  Neurologic Normal speech. Oriented to person, place, and time. Protective sensation intact 5/5 intact bilaterally with 10g monofilament b/l. Vibratory sensation intact b/l.  Dermatologic Pedal integument with normal turgor, texture and tone b/l LE. No open wounds b/l. No interdigital macerations b/l. Toenails 1-5 b/l elongated, thickened, discolored with subungual debris. +Tenderness with dorsal palpation of nailplates. Hyperkeratotic lesion(s) noted L 3rd toe.  Orthopedic: Muscle strength 5/5 to all lower  extremity muscle groups bilaterally. HAV with bunion deformity noted b/l LE. Hammertoe(s) noted to the L 3rd toe.   Radiographs: None Assessment:   1. Pain due to onychomycosis of toenails of both feet   2. Corns   3. Diabetes mellitus without complication ([place])    Plan:  Patient was evaluated and treated and all questions answered. Consent given for treatment as described below: -Toenails 1-5 b/l were debrided in length and girth with sterile nail nippers and dremel without iatrogenic bleeding.  -Corn(s) L 3rd toe pared utilizing sterile scalpel blade without complication or incident. Total number debrided=1. -Patient/POA to call should there be question/concern in the interim.  Return in about 3 months (around 11/21/2021).  Marzetta Board, DPM

## 2021-08-29 ENCOUNTER — Telehealth: Payer: Self-pay

## 2021-08-29 DIAGNOSIS — M25552 Pain in left hip: Secondary | ICD-10-CM | POA: Diagnosis not present

## 2021-08-29 DIAGNOSIS — M5116 Intervertebral disc disorders with radiculopathy, lumbar region: Secondary | ICD-10-CM | POA: Diagnosis not present

## 2021-08-29 DIAGNOSIS — M47816 Spondylosis without myelopathy or radiculopathy, lumbar region: Secondary | ICD-10-CM | POA: Diagnosis not present

## 2021-08-29 DIAGNOSIS — I10 Essential (primary) hypertension: Secondary | ICD-10-CM | POA: Diagnosis not present

## 2021-08-29 DIAGNOSIS — M461 Sacroiliitis, not elsewhere classified: Secondary | ICD-10-CM | POA: Diagnosis not present

## 2021-08-29 MED ORDER — DULAGLUTIDE 3 MG/0.5ML ~~LOC~~ SOAJ: 3.0000 mg | SUBCUTANEOUS | 2 refills | Status: DC

## 2021-08-29 NOTE — Telephone Encounter (Signed)
Patient calls nurse line reporting difficulty picking up Trulicity prescription. ? ?I spoke with the pharmacy and the prescription needs to be either sent in as a 3mg  pen or the directions changed to inject .75mg . ? ?Please advise.  ?

## 2021-08-29 NOTE — Telephone Encounter (Signed)
I have fixed the order and have sent in 3 mg pens. ?

## 2021-08-31 ENCOUNTER — Other Ambulatory Visit: Payer: Self-pay | Admitting: Dermatology

## 2021-08-31 ENCOUNTER — Other Ambulatory Visit: Payer: Self-pay | Admitting: Family Medicine

## 2021-09-05 ENCOUNTER — Other Ambulatory Visit: Payer: Self-pay | Admitting: Family Medicine

## 2021-09-05 DIAGNOSIS — E119 Type 2 diabetes mellitus without complications: Secondary | ICD-10-CM

## 2021-09-05 NOTE — Telephone Encounter (Signed)
Patient calls nurse line regarding request for glucometer. Patient states that she has always checked her blood sugar BID and would like to have meter [facility]. Patient is also requesting three month supply.  ? ?Patient reports that new insurance will only cover Accu-chek supplies. These supplies have been pended to this encounter.  ? ?Please advise.  ? ?Talbot Grumbling, RN ? ?

## 2021-09-06 MED ORDER — ACCU-CHEK GUIDE W/DEVICE KIT: PACK | 0 refills | Status: DC

## 2021-09-06 MED ORDER — ACCU-CHEK SOFTCLIX LANCETS MISC: 4 refills | Status: DC

## 2021-09-18 DIAGNOSIS — Z5181 Encounter for therapeutic drug level monitoring: Secondary | ICD-10-CM | POA: Diagnosis not present

## 2021-09-18 DIAGNOSIS — Z79899 Other long term (current) drug therapy: Secondary | ICD-10-CM | POA: Diagnosis not present

## 2021-09-18 DIAGNOSIS — M153 Secondary multiple arthritis: Secondary | ICD-10-CM | POA: Diagnosis not present

## 2021-09-18 DIAGNOSIS — M7632 Iliotibial band syndrome, left leg: Secondary | ICD-10-CM | POA: Diagnosis not present

## 2021-09-18 DIAGNOSIS — M059 Rheumatoid arthritis with rheumatoid factor, unspecified: Secondary | ICD-10-CM | POA: Diagnosis not present

## 2021-09-24 DIAGNOSIS — M5416 Radiculopathy, lumbar region: Secondary | ICD-10-CM | POA: Diagnosis not present

## 2021-09-26 DIAGNOSIS — H02831 Dermatochalasis of right upper eyelid: Secondary | ICD-10-CM | POA: Diagnosis not present

## 2021-09-26 DIAGNOSIS — E119 Type 2 diabetes mellitus without complications: Secondary | ICD-10-CM | POA: Diagnosis not present

## 2021-09-26 DIAGNOSIS — H02034 Senile entropion of left upper eyelid: Secondary | ICD-10-CM | POA: Diagnosis not present

## 2021-09-26 DIAGNOSIS — H59021 Cataract (lens) fragments in eye following cataract surgery, right eye: Secondary | ICD-10-CM | POA: Diagnosis not present

## 2021-09-26 DIAGNOSIS — H1045 Other chronic allergic conjunctivitis: Secondary | ICD-10-CM | POA: Diagnosis not present

## 2021-09-26 DIAGNOSIS — H04123 Dry eye syndrome of bilateral lacrimal glands: Secondary | ICD-10-CM | POA: Diagnosis not present

## 2021-09-26 DIAGNOSIS — H40013 Open angle with borderline findings, low risk, bilateral: Secondary | ICD-10-CM | POA: Diagnosis not present

## 2021-09-26 DIAGNOSIS — H02834 Dermatochalasis of left upper eyelid: Secondary | ICD-10-CM | POA: Diagnosis not present

## 2021-09-26 DIAGNOSIS — H5211 Myopia, right eye: Secondary | ICD-10-CM | POA: Diagnosis not present

## 2021-10-10 DIAGNOSIS — H02132 Senile ectropion of right lower eyelid: Secondary | ICD-10-CM | POA: Insufficient documentation

## 2021-10-10 DIAGNOSIS — H02011 Cicatricial entropion of right upper eyelid: Secondary | ICD-10-CM | POA: Insufficient documentation

## 2021-10-10 DIAGNOSIS — H16213 Exposure keratoconjunctivitis, bilateral: Secondary | ICD-10-CM | POA: Insufficient documentation

## 2021-10-10 DIAGNOSIS — H02403 Unspecified ptosis of bilateral eyelids: Secondary | ICD-10-CM | POA: Insufficient documentation

## 2021-10-17 ENCOUNTER — Telehealth: Payer: Self-pay

## 2021-10-17 MED ORDER — OXYCODONE-ACETAMINOPHEN 10-325 MG PO TABS: 1.0000 | ORAL_TABLET | Freq: Four times a day (QID) | ORAL | 0 refills | Status: DC | PRN

## 2021-10-17 NOTE — Telephone Encounter (Signed)
Received fax from pharmacy, PA needed on Oxycodone. Clinical questions submitted via Cover My Meds.   ? ?Medication approved through 10/17/2022 and the pharmacy has been updated.  ? ?

## 2021-10-17 NOTE — Telephone Encounter (Signed)
[facility] calls nurse line reporting [facility] no [facility] ? ?[facility] and [facility].  ? ?Please resend to [facility] on [address]. ? ?Will forward to morning preceptor as PCP is post call. ?

## 2021-11-07 ENCOUNTER — Other Ambulatory Visit: Payer: Self-pay

## 2021-11-07 MED ORDER — DULAGLUTIDE 3 MG/0.5ML ~~LOC~~ SOAJ: 3.0000 mg | SUBCUTANEOUS | 2 refills | Status: DC

## 2021-11-07 MED ORDER — ROSUVASTATIN CALCIUM 10 MG PO TABS: 10.0000 mg | ORAL_TABLET | Freq: Every day | ORAL | 3 refills | Status: DC

## 2021-11-07 MED ORDER — METFORMIN HCL ER 500 MG PO TB24: 1000.0000 mg | ORAL_TABLET | Freq: Two times a day (BID) | ORAL | 1 refills | Status: DC

## 2021-11-14 ENCOUNTER — Telehealth: Payer: Self-pay

## 2021-11-14 NOTE — Telephone Encounter (Signed)
Patient LVM on nurse line reporting Trulicity needs a prior authorization. ? ?Patient reports she wants to get this prescription from [facility] as a 90 day supply.  ? ?Will forward to [place].  ?

## 2021-11-15 ENCOUNTER — Other Ambulatory Visit (HOSPITAL_COMMUNITY): Payer: Self-pay

## 2021-11-15 MED ORDER — DULAGLUTIDE 3 MG/0.5ML ~~LOC~~ SOAJ
3.0000 mg | SUBCUTANEOUS | 2 refills | Status: DC
Start: 2021-11-15 — End: 2021-12-27

## 2021-11-15 NOTE — Telephone Encounter (Signed)
A Prior Authorization was initiated for this patients TRULICITY through CoverMyMeds.   Key: [id number]

## 2021-11-15 NOTE — Addendum Note (Signed)
Addended by: [name] D on: 11/15/2021 01:34 PM   Modules accepted: Orders

## 2021-11-15 NOTE — Telephone Encounter (Addendum)
Prior Auth for patients medication TRULICITY approved by [facility] from 11/15/21 to 11/15/22.  Key: [id number]  [facility] notified [facility] will [facility] to have a 3 month supply mailed to her. Can a future RX be sent for 11m (3 boxes) with refills? Pt prefers 90 day supply since it's easier for her.

## 2021-11-19 NOTE — Progress Notes (Signed)
SUBJECTIVE:   CHIEF COMPLAINT / HPI:  No chief complaint on file.   T2DM Takes metformin 1000 mg BID and dulaglutide 3 mg weekly.  Reports worsening chronic left hip pain, now with new tingling in the left foot. She is having difficulty with ambulation as a result. Pain is worse with ambulation, improves with rest. She saw her rheumatologist 2 months ago, was told she was doing well from an arthritis standpoint. She has had two back surgeries in the past, reports she last saw her neurosurgeon about a year ago. She has received injections in her back.  She has tried PT in the past for her back and has not felt that has been helpful.  She has a remote history of smoking, for about 5 years. Denies saddle anesthesia, bowel/bladder incontinence, fever, chills. She needs refill of her Percocet which she takes chronically.  PERTINENT  PMH / PSH: T2DM, RA, chronic pain, HLD, GAD  Patient Care Team: [name], MD as PCP - General (Family Medicine) Alda Berthold, DO as Consulting Physician (Neurology)   OBJECTIVE:   BP 130/77   Pulse 97   Ht '5\' 2"'$  (1.575 m)   Wt 137 lb (62.1 kg)   SpO2 97%   BMI 25.06 kg/m   Physical Exam Constitutional:      General: She is not in acute distress. Cardiovascular:     Rate and Rhythm: Normal rate and regular rhythm.     Pulses:          Dorsalis pedis pulses are 2+ on the right side and 2+ on the left side.  Pulmonary:     Effort: Pulmonary effort is normal. No respiratory distress.     Comments: Diffuse fine rales Musculoskeletal:     Cervical back: Neck supple.     Comments: No midline spinous or paraspinal muscle tenderness.  Negative straight leg raise.  Left hip there is mild limitation in ROM due to pain with external and internal rotation (worse with external rotation).    Skin:    General: Skin is warm and dry.  Neurological:     Mental Status: She is alert.     Comments: Full strength in lower extremities        11/21/2021     1:34 PM  Depression screen PHQ 2/9  Decreased Interest 0  Down, Depressed, Hopeless 0  PHQ - 2 Score 0  Altered sleeping 0  Tired, decreased energy 0  Change in appetite 0  Feeling bad or failure about yourself  0  Trouble concentrating 0  Moving slowly or fidgety/restless 0  Suicidal thoughts 0  PHQ-9 Score 0  Difficult doing work/chores Somewhat difficult     {Show previous vital signs (optional):23777}    ASSESSMENT/PLAN:   Type II diabetes mellitus Check A1c  Encounter for chronic pain management PMP reviewed.  Refilled oxycodone-acetaminophen for additional 3 months.  Left hip pain Chronic left hip pain appears to be worsening with radicular symptoms down the left leg.  Recently seen by rheumatologist felt not to be related to RA.  Could be related to lumbar radiculopathy from spinal stenosis, though exam reveals possible origin of pain in the hip joint.  PAD unlikely with normal ABI checked in office today (see separate note). - advised to follow-up with her neurosurgeon - declined PT - consider repeat hip XR (most recent imaging in 2021 unremarkable) and orthopedic referral at follow-up    Return in about 3 months (around 02/21/2022) for f/u  DM.   [name], MD [place]

## 2021-11-21 ENCOUNTER — Encounter: Payer: Self-pay | Admitting: Family Medicine

## 2021-11-21 ENCOUNTER — Ambulatory Visit (INDEPENDENT_AMBULATORY_CARE_PROVIDER_SITE_OTHER): Payer: Medicare Other | Admitting: Family Medicine

## 2021-11-21 VITALS — BP 130/77 | HR 97 | Ht 62.0 in | Wt 137.0 lb

## 2021-11-21 DIAGNOSIS — G8929 Other chronic pain: Secondary | ICD-10-CM | POA: Diagnosis not present

## 2021-11-21 DIAGNOSIS — M25552 Pain in left hip: Secondary | ICD-10-CM

## 2021-11-21 DIAGNOSIS — E1142 Type 2 diabetes mellitus with diabetic polyneuropathy: Secondary | ICD-10-CM

## 2021-11-21 MED ORDER — OXYCODONE-ACETAMINOPHEN 10-325 MG PO TABS: 1.0000 | ORAL_TABLET | Freq: Four times a day (QID) | ORAL | 0 refills | Status: DC | PRN

## 2021-11-21 NOTE — Patient Instructions (Addendum)
It was nice seeing you today!  Medications have been refilled.  I recommend getting in touch with your neurosurgeon.  Follow-up in 3 months.  Stay well, Michelle Button, MD [place] [phone]  --  Make sure to check out at the front desk before you leave today.  Please arrive at least 15 minutes prior to your scheduled appointments.  If you had blood work today, I will send you a MyChart message or a letter if results are normal. Otherwise, I will give you a call.  If you had a referral placed, they will call you to set up an appointment. Please give [place] a call if you don't hear back in the next 2 weeks.  If you need additional refills before your next appointment, please call your pharmacy first.

## 2021-11-21 NOTE — Progress Notes (Unsigned)
ABI READING  Left ABI 1.20  RIGHT ABI 1.20  SYS 122 DIA 72 HEART RATE 104

## 2021-11-22 LAB — HEMOGLOBIN A1C
Est. average glucose Bld gHb Est-mCnc: 157 mg/dL
Hgb A1c MFr Bld: 7.1 % — ABNORMAL HIGH (ref 4.8–5.6)

## 2021-11-22 NOTE — Assessment & Plan Note (Signed)
Check A1c. 

## 2021-11-22 NOTE — Assessment & Plan Note (Signed)
PMP reviewed.  Refilled oxycodone-acetaminophen for additional 3 months.

## 2021-11-22 NOTE — Assessment & Plan Note (Signed)
Chronic left hip pain appears to be worsening with radicular symptoms down the left leg.  Recently seen by rheumatologist felt not to be related to RA.  Could be related to lumbar radiculopathy from spinal stenosis, though exam reveals possible origin of pain in the hip joint.  PAD unlikely with normal ABI checked in office today (see separate note). - advised to follow-up with her neurosurgeon - declined PT - consider repeat hip XR (most recent imaging in 2021 unremarkable) and orthopedic referral at follow-up

## 2021-11-28 ENCOUNTER — Ambulatory Visit (INDEPENDENT_AMBULATORY_CARE_PROVIDER_SITE_OTHER): Payer: Medicare Other | Admitting: Podiatry

## 2021-11-28 ENCOUNTER — Encounter: Payer: Self-pay | Admitting: Podiatry

## 2021-11-28 DIAGNOSIS — M79674 Pain in right toe(s): Secondary | ICD-10-CM | POA: Diagnosis not present

## 2021-11-28 DIAGNOSIS — M79675 Pain in left toe(s): Secondary | ICD-10-CM

## 2021-11-28 DIAGNOSIS — B351 Tinea unguium: Secondary | ICD-10-CM | POA: Diagnosis not present

## 2021-11-28 DIAGNOSIS — R0989 Other specified symptoms and signs involving the circulatory and respiratory systems: Secondary | ICD-10-CM | POA: Diagnosis not present

## 2021-11-28 DIAGNOSIS — E1151 Type 2 diabetes mellitus with diabetic peripheral angiopathy without gangrene: Secondary | ICD-10-CM | POA: Diagnosis not present

## 2021-11-28 DIAGNOSIS — M47816 Spondylosis without myelopathy or radiculopathy, lumbar region: Secondary | ICD-10-CM | POA: Insufficient documentation

## 2021-12-02 NOTE — Progress Notes (Signed)
  Subjective:  Patient ID: Michelle [name], female    DOB: 04-Sep-1950,  MRN: [id number]  SAPHIRE BARNHART presents to clinic today for preventative diabetic foot care and painful elongated mycotic toenails 1-5 bilaterally which are tender when wearing enclosed shoe gear. Pain is relieved with periodic professional debridement.  Patient states she rarely checks her blood sugar. Last known HgA1c was 6.1%.  New problem(s):  Patient c/o discomfort in left lower extremity. States it feels cold at times. Denies any redness, drainage or swelling. She does have history of RA.  PCP is [name], MD , and last visit was Nov 21, 2021.  Allergies  Allergen Reactions   Zofran [Ondansetron Hcl] Other (See Comments)    Prolong QT   Ace Inhibitors Cough    Occurred with lisinopril   Levofloxacin Other (See Comments)    Felt things on her legs that were not there,made her stomach hurt   Lisinopril Cough   Sulfonamide Derivatives Hives and Itching    Review of Systems: Negative except as noted in the HPI.  Objective: No changes noted in today's physical examination.  Constitutional ROSEANNA KOPLIN is a pleasant 71 y.o. Caucasian female, WD, WN in NAD. AAO x 3.   Vascular CFT < 3 seconds b/l. Palpable DP pulse(s) right lower extremity Palpable PT pulse(s) right lower extremity Diminished DP pulse(s) left lower extremity. Diminished PT pulse(s) left lower extremity. Pedal hair absent. No pain with calf compression b/l. No cyanosis or clubbing noted.  Neurologic Normal speech. Oriented to person, place, and time. Protective sensation intact 5/5 intact bilaterally with 10g monofilament b/l. Vibratory sensation intact b/l.  Dermatologic Pedal integument with normal turgor, texture and tone b/l LE. No open wounds b/l. No interdigital macerations b/l. Toenails 1-5 b/l elongated, thickened, discolored with subungual debris. +Tenderness with dorsal palpation of nailplates. No hyperkeratotic nor porokeratotic  lesions present on today's visit.   Orthopedic: Muscle strength 5/5 to all lower extremity muscle groups bilaterally. HAV with bunion deformity noted b/l LE. Hammertoe(s) noted to the L 3rd toe.   Radiographs: None    Latest Ref Rng & Units 11/21/2021    2:23 PM 08/27/2021    3:05 PM 05/30/2021    8:52 AM 02/28/2021    9:16 AM 12/06/2020    8:30 AM  Hemoglobin A1C  Hemoglobin-A1c 4.8 - 5.6 % 7.1   7.7   6.8   7.5   7.1     Assessment/Plan: 1. Pain due to onychomycosis of toenails of both feet   2. Diminished pulses in lower extremity   3. Diabetes mellitus with peripheral circulatory disorder [facility])     -Patient was evaluated and treated. All patient's and/or POA's questions/concerns answered on today's visit. -Ordered noninvasive arterial studies ABIs with and without TBIs for b/l lower extremities. -Continue diabetic foot care principles: inspect feet daily, monitor glucose as recommended by PCP and/or Endocrinologist, and follow prescribed diet per PCP, Endocrinologist and/or dietician. -Continue diabetic shoes daily. -Mycotic toenails 1-5 bilaterally were debrided in length and girth with sterile nail nippers and dremel without incident. -Patient/POA to call should there be question/concern in the interim.   Return in about 3 months (around 02/28/2022).  Marzetta Board, DPM

## 2021-12-04 ENCOUNTER — Other Ambulatory Visit: Payer: Self-pay | Admitting: Family Medicine

## 2021-12-04 ENCOUNTER — Ambulatory Visit (HOSPITAL_COMMUNITY)
Admission: RE | Admit: 2021-12-04 | Discharge: 2021-12-04 | Disposition: A | Discharge status: Home / Self Care | Payer: Medicare Other | Source: Ambulatory Visit | Attending: Podiatry | Admitting: Podiatry

## 2021-12-04 ENCOUNTER — Encounter: Payer: Self-pay | Admitting: *Deleted

## 2021-12-04 DIAGNOSIS — R0989 Other specified symptoms and signs involving the circulatory and respiratory systems: Secondary | ICD-10-CM | POA: Insufficient documentation

## 2021-12-04 DIAGNOSIS — E1151 Type 2 diabetes mellitus with diabetic peripheral angiopathy without gangrene: Secondary | ICD-10-CM | POA: Diagnosis present

## 2021-12-04 DIAGNOSIS — E119 Type 2 diabetes mellitus without complications: Secondary | ICD-10-CM

## 2021-12-10 ENCOUNTER — Telehealth (INDEPENDENT_AMBULATORY_CARE_PROVIDER_SITE_OTHER): Payer: Medicare Other | Admitting: *Deleted

## 2021-12-10 NOTE — Telephone Encounter (Signed)
Phoned patient to give her results of ABIs which were normal and revealed no evidence of PAD. She is to follow up with her Neurologist or Orthopedist for left limb numbness.

## 2021-12-10 NOTE — Telephone Encounter (Signed)
Patient is calling for Vascular US study results from 11/28/21(results in epic).Please advise.

## 2021-12-11 ENCOUNTER — Other Ambulatory Visit: Payer: Self-pay | Admitting: Family Medicine

## 2021-12-11 DIAGNOSIS — E119 Type 2 diabetes mellitus without complications: Secondary | ICD-10-CM

## 2021-12-11 MED ORDER — ACCU-CHEK GUIDE VI STRP: ORAL_STRIP | 4 refills | Status: DC

## 2021-12-12 ENCOUNTER — Encounter: Payer: Self-pay | Admitting: Dermatology

## 2021-12-12 ENCOUNTER — Ambulatory Visit (INDEPENDENT_AMBULATORY_CARE_PROVIDER_SITE_OTHER): Payer: Medicare Other | Admitting: Dermatology

## 2021-12-12 ENCOUNTER — Telehealth: Payer: Self-pay

## 2021-12-12 DIAGNOSIS — L400 Psoriasis vulgaris: Secondary | ICD-10-CM | POA: Diagnosis not present

## 2021-12-12 DIAGNOSIS — L821 Other seborrheic keratosis: Secondary | ICD-10-CM

## 2021-12-12 DIAGNOSIS — Z1283 Encounter for screening for malignant neoplasm of skin: Secondary | ICD-10-CM

## 2021-12-12 DIAGNOSIS — Z85828 Personal history of other malignant neoplasm of skin: Secondary | ICD-10-CM | POA: Diagnosis not present

## 2021-12-12 DIAGNOSIS — L03818 Cellulitis of other sites: Secondary | ICD-10-CM | POA: Diagnosis not present

## 2021-12-12 DIAGNOSIS — L409 Psoriasis, unspecified: Secondary | ICD-10-CM

## 2021-12-12 DIAGNOSIS — L578 Other skin changes due to chronic exposure to nonionizing radiation: Secondary | ICD-10-CM

## 2021-12-12 MED ORDER — CLOBETASOL PROPIONATE 0.05 % EX SOLN: 1.0000 "application " | Freq: Every day | CUTANEOUS | 3 refills | Status: DC

## 2021-12-12 MED ORDER — MOMETASONE FUROATE 0.1 % EX CREA: TOPICAL_CREAM | CUTANEOUS | 3 refills | Status: DC

## 2021-12-12 MED ORDER — HYDROXYZINE HCL 25 MG PO TABS: ORAL_TABLET | ORAL | 1 refills | Status: DC

## 2021-12-12 MED ORDER — MUPIROCIN 2 % EX OINT: 1.0000 "application " | TOPICAL_OINTMENT | Freq: Two times a day (BID) | CUTANEOUS | 0 refills | Status: DC

## 2021-12-12 MED ORDER — DOXYCYCLINE HYCLATE 100 MG PO TABS: 100.0000 mg | ORAL_TABLET | Freq: Two times a day (BID) | ORAL | 0 refills | Status: AC

## 2021-12-12 NOTE — Patient Instructions (Signed)
Due to recent changes in healthcare laws, you may see results of your pathology and/or laboratory studies on MyChart before the doctors have had a chance to review them. We understand that in some cases there may be results that are confusing or concerning to you. Please understand that not all results are received at the same time and often the doctors may need to interpret multiple results in order to provide you with the best plan of care or course of treatment. Therefore, we ask that you please give us 2 business days to thoroughly review all your results before contacting the office for clarification. Should we see a critical lab result, you will be contacted sooner.   If You Need Anything After Your Visit  If you have any questions or concerns for your doctor, please call our main line at [phone] and press option 4 to reach your doctor's medical assistant. If no one answers, please leave a voicemail as directed and we will return your call as soon as possible. Messages left after 4 pm will be answered the following business day.   You may also send us a message via MyChart. We typically respond to MyChart messages within 1-2 business days.  For prescription refills, please ask your pharmacy to contact our office. Our fax number is [phone].  If you have an urgent issue when the clinic is closed that cannot wait until the next business day, you can page your doctor at the number below.    Please note that while we do our best to be available for urgent issues outside of office hours, we are not available 24/7.   If you have an urgent issue and are unable to reach us, you may choose to seek medical care at your doctor's office, retail clinic, urgent care center, or emergency room.  If you have a medical emergency, please immediately call 911 or go to the emergency department.  Pager Numbers  - Dr. Kowalski: [phone]  - Dr. Moye: [phone]  - Dr. Stewart:  [phone]  In the event of inclement weather, please call our main line at [phone] for an update on the status of any delays or closures.  Dermatology Medication Tips: Please keep the boxes that topical medications come in in order to help keep track of the instructions about where and how to use these. Pharmacies typically print the medication instructions only on the boxes and not directly on the medication tubes.   If your medication is too expensive, please contact our office at [phone] option 4 or send us a message through MyChart.   We are unable to tell what your co-pay for medications will be in advance as this is different depending on your insurance coverage. However, we may be able to find a substitute medication at lower cost or fill out paperwork to get insurance to cover a needed medication.   If a prior authorization is required to get your medication covered by your insurance company, please allow us 1-2 business days to complete this process.  Drug prices often vary depending on where the prescription is filled and some pharmacies may offer cheaper prices.  The website www.goodrx.com contains coupons for medications through different pharmacies. The prices here do not account for what the cost may be with help from insurance (it may be cheaper with your insurance), but the website can give you the price if you did not use any insurance.  - You can print the associated coupon and take it with   your prescription to the pharmacy.  - You may also stop by our office during regular business hours and pick up a GoodRx coupon card.  - If you need your prescription sent electronically to a different pharmacy, notify our office through [place] MyChart or by phone at [phone] option 4.     Si Usted Necesita Algo Despus de Su Visita  Tambin puede enviarnos un mensaje a travs de MyChart. Por lo general respondemos a los mensajes de MyChart en el transcurso de 1 a 2  das hbiles.  Para renovar recetas, por favor pida a su farmacia que se ponga en contacto con nuestra oficina. Nuestro nmero de fax es el [phone].  Si tiene un asunto urgente cuando la clnica est cerrada y que no puede esperar hasta el siguiente da hbil, puede llamar/localizar a su doctor(a) al nmero que aparece a continuacin.   Por favor, tenga en cuenta que aunque hacemos todo lo posible para estar disponibles para asuntos urgentes fuera del horario de oficina, no estamos disponibles las 24 horas del da, los 7 das de la semana.   Si tiene un problema urgente y no puede comunicarse con nosotros, puede optar por buscar atencin mdica  en el consultorio de su doctor(a), en una clnica privada, en un centro de atencin urgente o en una sala de emergencias.  Si tiene una emergencia mdica, por favor llame inmediatamente al 911 o vaya a la sala de emergencias.  Nmeros de bper  - Dr. Kowalski: [phone]  - Dra. Moye: [phone]  - Dra. Stewart: [phone]  En caso de inclemencias del tiempo, por favor llame a nuestra lnea principal al [phone] para una actualizacin sobre el estado de cualquier retraso o cierre.  Consejos para la medicacin en dermatologa: Por favor, guarde las cajas en las que vienen los medicamentos de uso tpico para ayudarle a seguir las instrucciones sobre dnde y cmo usarlos. Las farmacias generalmente imprimen las instrucciones del medicamento slo en las cajas y no directamente en los tubos del medicamento.   Si su medicamento es muy caro, por favor, pngase en contacto con nuestra oficina llamando al [phone] y presione la opcin 4 o envenos un mensaje a travs de MyChart.   No podemos decirle cul ser su copago por los medicamentos por adelantado ya que esto es diferente dependiendo de la cobertura de su seguro. Sin embargo, es posible que podamos encontrar un medicamento sustituto a menor costo o llenar un formulario para que el  seguro cubra el medicamento que se considera necesario.   Si se requiere una autorizacin previa para que su compaa de seguros cubra su medicamento, por favor permtanos de 1 a 2 das hbiles para completar este proceso.  Los precios de los medicamentos varan con frecuencia dependiendo del lugar de dnde se surte la receta y alguna farmacias pueden ofrecer precios ms baratos.  El sitio web www.goodrx.com tiene cupones para medicamentos de diferentes farmacias. Los precios aqu no tienen en cuenta lo que podra costar con la ayuda del seguro (puede ser ms barato con su seguro), pero el sitio web puede darle el precio si no utiliz ningn seguro.  - Puede imprimir el cupn correspondiente y llevarlo con su receta a la farmacia.  - Tambin puede pasar por nuestra oficina durante el horario de atencin regular y recoger una tarjeta de cupones de GoodRx.  - Si necesita que su receta se enve electrnicamente a una farmacia diferente, informe a nuestra oficina a travs de MyChart de [place]   o por telfono llamando al [phone] y presione la opcin 4.  

## 2021-12-12 NOTE — Progress Notes (Signed)
   Follow-Up Visit   Subjective  Michelle [name] is a 71 y.o. female who presents for the following: Annual Exam (History of SCC - The patient presents for Upper Body Skin Exam (UBSE) for skin cancer screening and mole check.  The patient has spots, moles and lesions to be evaluated, some may be new or changing and the patient has concerns that these could be cancer./).  The following portions of the chart were reviewed this encounter and updated as appropriate:   Tobacco  Allergies  Meds  Problems  Med Hx  Surg Hx  Fam Hx     Review of Systems:  No other skin or systemic complaints except as noted in HPI or Assessment and Plan.  Objective  Well appearing patient in no apparent distress; mood and affect are within normal limits.  All skin waist up examined.  Left post scalp, right post scalp, left scapula Crusted papules of right post scalp, left post scalp and left scapula. Crusts and excoriations of trunk and arms   Assessment & Plan  Cellulitis 2ndary to excoriated psoriasis plaques Left post scalp, right post scalp, left scapula  Start Doxycycline 100  mg 1 po bid with food and plenty of fluid, Mupirocin oint bid  doxycycline (VIBRA-TABS) 100 MG tablet - Left post scalp, right post scalp, left scapula Take 1 tablet (100 mg total) by mouth 2 (two) times daily for 10 days. With food and plenty of fluid  mupirocin ointment (BACTROBAN) 2 % - Left post scalp, right post scalp, left scapula Apply 1 application  topically 2 (two) times daily.  Psoriasis Face; Scalp  clobetasol (TEMOVATE) 0.05 % external solution - Scalp Apply 1 application  topically daily. To scalp up to 5 times per week  mometasone (ELOCON) 0.1 % cream - Face APPLY 1 APPLICATION TOPICALLY DAILY AS NEEDED (RASH).  hydrOXYzine (ATARAX) 25 MG tablet - Face, Scalp Take 1-2 tablets qhs prn itch. May make drowsy  Actinic Damage - chronic, secondary to cumulative UV radiation exposure/sun exposure over  time - diffuse scaly erythematous macules with underlying dyspigmentation - Recommend daily broad spectrum sunscreen SPF 30+ to sun-exposed areas, reapply every 2 hours as needed.  - Recommend staying in the shade or wearing long sleeves, sun glasses (UVA+UVB protection) and wide brim hats (4-inch brim around the entire circumference of the hat). - Call for new or changing lesions.  Seborrheic Keratoses - Stuck-on, waxy, tan-brown papules and/or plaques  - Benign-appearing - Discussed benign etiology and prognosis. - Observe - Call for any changes  Skin cancer screening  Return for 6-8 weeks for cellulitis follow up.  I, Ashok Cordia, CMA, am acting as scribe for Sarina Ser, MD . Documentation: I have reviewed the above documentation for accuracy and completeness, and I agree with the above.  Sarina Ser, MD

## 2021-12-12 NOTE — Telephone Encounter (Signed)
Spoke to patient to notify her of Dr. Heber Menard comments. Patient verbalized understanding. All questions answered at this time. Also reminded patient of upcoming appointment date and time with Dr. Elisha Ponder.   Dr. Heber [place] comments: Please contact patient and let her know her circulation test came back normal. I will see her at her next scheduled appointment.    Thanks! Dr. Darnell Level.

## 2021-12-12 NOTE — Telephone Encounter (Signed)
-----   Message from Marzetta Board, DPM sent at 12/04/2021  2:24 PM EDT ----- Please contact patient and let her know her circulation test came back normal. I will see her at her next scheduled appointment.   Thanks! Dr. Darnell Level.

## 2021-12-13 ENCOUNTER — Other Ambulatory Visit: Payer: Self-pay

## 2021-12-13 MED ORDER — FLUCONAZOLE 200 MG PO TABS: 200.0000 mg | ORAL_TABLET | ORAL | 0 refills | Status: DC

## 2021-12-13 MED ORDER — FLUTICASONE PROPIONATE 50 MCG/ACT NA SUSP: 2.0000 | Freq: Every day | NASAL | 1 refills | Status: DC | PRN

## 2021-12-17 ENCOUNTER — Other Ambulatory Visit: Payer: Self-pay

## 2021-12-17 MED ORDER — OMEPRAZOLE 40 MG PO CPDR: 40.0000 mg | DELAYED_RELEASE_CAPSULE | Freq: Two times a day (BID) | ORAL | 0 refills | Status: DC

## 2021-12-18 ENCOUNTER — Other Ambulatory Visit: Payer: Self-pay

## 2021-12-18 DIAGNOSIS — J45909 Unspecified asthma, uncomplicated: Secondary | ICD-10-CM

## 2021-12-18 MED ORDER — VENTOLIN HFA 108 (90 BASE) MCG/ACT IN AERS: INHALATION_SPRAY | RESPIRATORY_TRACT | 11 refills | Status: DC

## 2021-12-20 ENCOUNTER — Other Ambulatory Visit: Payer: Self-pay

## 2021-12-20 ENCOUNTER — Other Ambulatory Visit (HOSPITAL_COMMUNITY): Payer: Self-pay

## 2021-12-21 MED ORDER — FLUTICASONE PROPIONATE 50 MCG/ACT NA SUSP
2.0000 | Freq: Every day | NASAL | 1 refills | Status: DC | PRN
Start: 2021-12-21 — End: 2023-03-04

## 2021-12-21 MED ORDER — FLUTICASONE FUROATE-VILANTEROL 100-25 MCG/ACT IN AEPB: INHALATION_SPRAY | RESPIRATORY_TRACT | 1 refills | Status: DC

## 2021-12-24 ENCOUNTER — Other Ambulatory Visit: Payer: Self-pay | Admitting: Neurosurgery

## 2021-12-24 DIAGNOSIS — M5416 Radiculopathy, lumbar region: Secondary | ICD-10-CM

## 2021-12-25 ENCOUNTER — Telehealth: Payer: Self-pay

## 2021-12-25 DIAGNOSIS — E119 Type 2 diabetes mellitus without complications: Secondary | ICD-10-CM

## 2021-12-25 MED ORDER — ACCU-CHEK GUIDE VI STRP: ORAL_STRIP | 4 refills | Status: DC

## 2021-12-27 ENCOUNTER — Other Ambulatory Visit: Payer: Self-pay

## 2021-12-27 MED ORDER — DULAGLUTIDE 3 MG/0.5ML ~~LOC~~ SOAJ: 3.0000 mg | SUBCUTANEOUS | 2 refills | Status: DC

## 2021-12-31 ENCOUNTER — Ambulatory Visit
Admission: RE | Admit: 2021-12-31 | Discharge: 2021-12-31 | Disposition: A | Discharge status: Home / Self Care | Payer: Medicare Other | Source: Ambulatory Visit | Attending: Neurosurgery | Admitting: Neurosurgery

## 2021-12-31 DIAGNOSIS — M5416 Radiculopathy, lumbar region: Secondary | ICD-10-CM

## 2021-12-31 MED ORDER — IOPAMIDOL (ISOVUE-M 200) INJECTION 41%
15.0000 mL | Freq: Once | INTRAMUSCULAR | Status: AC
  Administered 2021-12-31: 15 mL via INTRATHECAL

## 2021-12-31 MED ORDER — DIAZEPAM 5 MG PO TABS
5.0000 mg | ORAL_TABLET | Freq: Once | ORAL | Status: AC
Start: 2021-12-31 — End: 2021-12-31
  Administered 2021-12-31: 5 mg via ORAL

## 2021-12-31 MED ORDER — MEPERIDINE HCL 50 MG/ML IJ SOLN: 50.0000 mg | Freq: Once | INTRAMUSCULAR | Status: DC | PRN

## 2021-12-31 NOTE — Discharge Instructions (Signed)

## 2022-01-08 ENCOUNTER — Other Ambulatory Visit: Payer: Self-pay | Admitting: Family Medicine

## 2022-01-08 NOTE — Telephone Encounter (Signed)
Patient calling regarding this refill on Trulicity. Patient wants to make sure it gets filled as a 3 month supply through [facility]. Please advise.

## 2022-01-09 ENCOUNTER — Other Ambulatory Visit: Payer: Self-pay | Admitting: Neurosurgery

## 2022-01-14 ENCOUNTER — Other Ambulatory Visit: Payer: Self-pay | Admitting: *Deleted

## 2022-01-14 MED ORDER — OXYCODONE-ACETAMINOPHEN 10-325 MG PO TABS
1.0000 | ORAL_TABLET | Freq: Four times a day (QID) | ORAL | 0 refills | Status: DC | PRN
Start: 2022-01-14 — End: 2022-01-15

## 2022-01-14 MED ORDER — OXYCODONE-ACETAMINOPHEN 10-325 MG PO TABS: 1.0000 | ORAL_TABLET | Freq: Four times a day (QID) | ORAL | 0 refills | Status: DC | PRN

## 2022-01-14 NOTE — Telephone Encounter (Signed)
PDMP reviewed and appropriate.  Refill sent for Percocet 10-325 q6h prn to [facility] [address] per patient request.

## 2022-01-14 NOTE — Telephone Encounter (Signed)
New prescription sent with proper start date (7/17).  Thank you.

## 2022-01-14 NOTE — Telephone Encounter (Signed)
Patient returns call to nurse line to check the status of refill being [facility] to [facility].   Refill was sent with start date of 8/16. Please send new prescription with start date of 7/17.  Talbot Grumbling, RN

## 2022-01-14 NOTE — Telephone Encounter (Signed)
[facility] does not have oxycodone in stock.  She has called [facility] on [address] and currently they have some.  She is requesting to have a script sent there.  To PCP. Christen Bame, CMA

## 2022-01-14 NOTE — Addendum Note (Signed)
Addended by: Alcus Dad on: 01/14/2022 01:58 PM   Modules accepted: Orders

## 2022-01-15 MED ORDER — OXYCODONE-ACETAMINOPHEN 10-325 MG PO TABS: 1.0000 | ORAL_TABLET | Freq: Four times a day (QID) | ORAL | 0 refills | Status: DC | PRN

## 2022-01-15 NOTE — Telephone Encounter (Signed)
Patient returns call to nurse line. She has not been able to pick up medication due to issue with [employer].   Called pharmacy. Pharmacist states that [place] did not capture and they need prescription sent again. She said that this was not an issue with resident and that this was an issue with their system.   Will forward to Dr. Rock Nephew to resend.   Talbot Grumbling, RN

## 2022-01-15 NOTE — Addendum Note (Signed)
Addended by: Talbot Grumbling on: 01/15/2022 09:29 AM   Modules accepted: Orders

## 2022-01-24 ENCOUNTER — Ambulatory Visit (INDEPENDENT_AMBULATORY_CARE_PROVIDER_SITE_OTHER): Payer: Medicare Other | Admitting: Dermatology

## 2022-01-24 DIAGNOSIS — L820 Inflamed seborrheic keratosis: Secondary | ICD-10-CM | POA: Diagnosis not present

## 2022-01-24 DIAGNOSIS — D692 Other nonthrombocytopenic purpura: Secondary | ICD-10-CM

## 2022-01-24 DIAGNOSIS — L409 Psoriasis, unspecified: Secondary | ICD-10-CM

## 2022-01-24 DIAGNOSIS — L03811 Cellulitis of head [any part, except face]: Secondary | ICD-10-CM | POA: Diagnosis not present

## 2022-01-24 DIAGNOSIS — T148XXA Other injury of unspecified body region, initial encounter: Secondary | ICD-10-CM

## 2022-01-24 DIAGNOSIS — L578 Other skin changes due to chronic exposure to nonionizing radiation: Secondary | ICD-10-CM | POA: Diagnosis not present

## 2022-01-24 DIAGNOSIS — L0390 Cellulitis, unspecified: Secondary | ICD-10-CM

## 2022-01-24 MED ORDER — MUPIROCIN 2 % EX OINT: 1.0000 | TOPICAL_OINTMENT | Freq: Every day | CUTANEOUS | 4 refills | Status: DC

## 2022-01-24 NOTE — Patient Instructions (Addendum)
Continue Mometasone cream daily up to 5 days a week to face and toes until clear, then as needed for flares Start Clobetasol solution daily up to 5 days a week to scalp as needed for flares, avoid face, groin, axilla Mupirocin ointment once daily to open sores on arms, legs, toes until healed  Can use OTC arnica containing moisturizer such as Dermend Bruise Formula for bruises on arms      Due to recent changes in healthcare laws, you may see results of your pathology and/or laboratory studies on MyChart before the doctors have had a chance to review them. We understand that in some cases there may be results that are confusing or concerning to you. Please understand that not all results are received at the same time and often the doctors may need to interpret multiple results in order to provide you with the best plan of care or course of treatment. Therefore, we ask that you please give [place] 2 business days to thoroughly review all your results before contacting the office for clarification. Should we see a critical lab result, you will be contacted sooner.   If You Need Anything After Your Visit  If you have any questions or concerns for your doctor, please call our main line at [phone] and press option 4 to reach your doctor's medical assistant. If no one answers, please leave a voicemail as directed and we will return your call as soon as possible. Messages left after 4 pm will be answered the following business day.   You may also send [place] a message via [place]. We typically respond to MyChart messages within 1-2 business days.  For prescription refills, please ask your pharmacy to contact our office. Our fax number is [phone].  If you have an urgent issue when the clinic is closed that cannot wait until the next business day, you can page your doctor at the number below.    Please note that while we do our best to be available for urgent issues outside of office hours, we are not  available 24/7.   If you have an urgent issue and are unable to reach [place], you may choose to seek medical care at your doctor's office, retail clinic, urgent care center, or emergency room.  If you have a medical emergency, please immediately call 911 or go to the emergency department.  Pager Numbers  - Dr. Nehemiah Massed: [phone]  - Dr. Laurence Ferrari: [phone]  - Dr. Nicole Kindred: [phone]  In the event of inclement weather, please call our main line at [phone] for an update on the status of any delays or closures.  Dermatology Medication Tips: Please keep the boxes that topical medications come in in order to help keep track of the instructions about where and how to use these. Pharmacies typically print the medication instructions only on the boxes and not directly on the medication tubes.   If your medication is too expensive, please contact our office at [phone] option 4 or send [place] a message through [place].   We are unable to tell what your co-pay for medications will be in advance as this is different depending on your insurance coverage. However, we may be able to find a substitute medication at lower cost or fill out paperwork to get insurance to cover a needed medication.   If a prior authorization is required to get your medication covered by your insurance company, please allow [place] 1-2 business days to complete this process.  Drug prices often vary  depending on where the prescription is filled and some pharmacies may offer cheaper prices.  The website www.goodrx.com contains coupons for medications through different pharmacies. The prices here do not account for what the cost may be with help from insurance (it may be cheaper with your insurance), but the website can give you the price if you did not use any insurance.  - You can print the associated coupon and take it with your prescription to the pharmacy.  - You may also stop by our office during regular business hours  and pick up a GoodRx coupon card.  - If you need your prescription sent electronically to a different pharmacy, notify our office through [facility] or by phone at [phone] option 4.     Si Usted Necesita Algo Despus de Su Visita  Tambin puede enviarnos un mensaje a travs de Pharmacist, community. Por lo general respondemos a los mensajes de MyChart en el transcurso de 1 a 2 das hbiles.  Para renovar recetas, por favor pida a su farmacia que se ponga en contacto con nuestra oficina. Harland Dingwall de fax es [place] [phone].  Si tiene un asunto urgente cuando la clnica est cerrada y que no puede esperar hasta el siguiente da hbil, puede llamar/localizar a su doctor(a) al nmero que aparece a continuacin.   Por favor, tenga en cuenta que aunque hacemos todo lo posible para estar disponibles para asuntos urgentes fuera del horario de [place], no estamos disponibles las 24 horas del da, los 7 das de la [place].   Si tiene un problema urgente y no puede comunicarse con nosotros, puede optar por buscar atencin mdica  en el consultorio de su doctor(a), en una clnica privada, en un centro de atencin urgente o en una sala de emergencias.  Si tiene Engineering geologist, por favor llame inmediatamente al 911 o vaya a la sala de emergencias.  Nmeros de bper  - Dr. Nehemiah Massed: [phone]  - Dra. Moye: [phone]  - Dra. Nicole Kindred: [phone]  En caso de inclemencias del [place], por favor llame a [place] Kindred principal al [phone] para una actualizacin sobre el [place] de cualquier retraso o cierre.  Consejos para la medicacin en dermatologa: Por favor, guarde las cajas en las que vienen los medicamentos de uso tpico para ayudarle a seguir las instrucciones sobre dnde y cmo usarlos. Las farmacias generalmente imprimen las instrucciones del medicamento slo en las cajas y no directamente en los tubos del [place].   Si su medicamento es muy caro, por favor, pngase  en contacto con Zigmund Daniel llamando al [phone] y presione la opcin 4 o envenos un mensaje a travs de Pharmacist, community.   No podemos decirle cul ser su copago por los medicamentos por adelantado ya que esto es diferente dependiendo de la cobertura de su seguro. Sin embargo, es posible que podamos encontrar un medicamento sustituto a Electrical engineer un formulario para que el seguro cubra el medicamento que se considera necesario.   Si se requiere una autorizacin previa para que su compaa de seguros [place] su medicamento, por favor permtanos de 1 a 2 das hbiles para completar este proceso.  Los precios de los medicamentos varan con frecuencia dependiendo del Environmental consultant de dnde se surte la receta y alguna farmacias pueden ofrecer precios ms baratos.  El sitio web www.goodrx.com tiene cupones para medicamentos de Airline pilot. Los precios aqu no tienen en cuenta lo que podra costar con la ayuda del seguro (puede ser ms barato con su seguro),  pero el sitio web puede darle el precio si no Field seismologist.  - Puede imprimir el cupn correspondiente y llevarlo con su receta a la farmacia.  - Tambin puede pasar por nuestra oficina durante el horario de atencin regular y Charity fundraiser una tarjeta de cupones de GoodRx.  - Si necesita que su receta se enve electrnicamente a una farmacia diferente, informe a nuestra oficina a travs de MyChart de [place] o por telfono llamando al [phone] y presione la opcin 4.

## 2022-01-24 NOTE — Progress Notes (Signed)
Follow-Up Visit   Subjective  Michelle [name] is a 71 y.o. female who presents for the following: Cellulitis 2ndary to excoriated psoriasis plaques (L post scalp, R post scalp, L scapula, 6wk f/ku, Doxycycline '100mg'$  bid x 10 days finished, Mupirocin oint) and check spots (L shoulder, itchy/Toes, spitting/Bruising arms). The patient has spots, moles and lesions to be evaluated, some may be new or changing and the patient has concerns that these could be cancer.  The following portions of the chart were reviewed this encounter and updated as appropriate:   Tobacco  Allergies  Meds  Problems  Med Hx  Surg Hx  Fam Hx     Review of Systems:  No other skin or systemic complaints except as noted in HPI or Assessment and Plan.  Objective  Well appearing patient in no apparent distress; mood and affect are within normal limits.  A focused examination was performed including scalp, L scapula, arms, legs, feet. Relevant physical exam findings are noted in the Assessment and Plan.  L post scalp, R post scalp, L scapula Scalp clear today  face, scalp, L great toe Fissure L great toe, R great toe  bil arms, bil legs Excoriations arms, legs  L post shoulder x 1 Stuck on waxy paps with erythema   Assessment & Plan   Purpura - Chronic; persistent and recurrent.  Treatable, but not curable. - Violaceous macules and patches - Benign - Related to trauma, age, sun damage and/or use of blood thinners, chronic use of topical and/or oral steroids - Observe - Can use OTC arnica containing moisturizer such as Dermend Bruise Formula if desired - Call for worsening or other concerns   Cellulitis, unspecified cellulitis site L post scalp, R post scalp, L scapula 2ndary to excoriated psoriasis plaques Resolved with Doxycycline bid x 10 days and Mupirocin oint  Psoriasis face, scalp, L great toe With fissure formation on feet Chronic and persistent condition with duration or expected  duration over one year. Condition is symptomatic / bothersome to patient. Not to goal.   Psoriasis is a chronic non-curable, but treatable genetic/hereditary disease that may have other systemic features affecting other organ systems such as joints (Psoriatic Arthritis). It is associated with an increased risk of inflammatory bowel disease, heart disease, non-alcoholic fatty liver disease, and depression.     Cont Mometasone cr qd up to 5 days a week to face and toes until clear, then as needed for flares Start Clobetasol solution qd up to 5 days a week to scalp as needed for flares, avoid face, groin, axilla (script sent in last visit)  If Clobetasol not covered will send in Mometasone solution  Topical steroids (such as triamcinolone, fluocinolone, fluocinonide, mometasone, clobetasol, halobetasol, betamethasone, hydrocortisone) can cause thinning and lightening of the skin if they are used for too long in the same area. Your physician has selected the right strength medicine for your problem and area affected on the body. Please use your medication only as directed by your physician to prevent side effects.    Related Medications clobetasol (TEMOVATE) 0.05 % external solution Apply 1 application  topically daily. To scalp up to 5 times per week mometasone (ELOCON) 0.1 % cream APPLY 1 APPLICATION TOPICALLY DAILY AS NEEDED (RASH). hydrOXYzine (ATARAX) 25 MG tablet Take 1-2 tablets qhs prn itch. May make drowsy  Excoriation bil arms, bil legs 2ndary to trauma Start Mupirocin oint qd to excoriations  mupirocin ointment (BACTROBAN) 2 % - bil arms, bil legs Apply 1  Application topically daily. Qd to open sores on arms, legs, toes until healed, then prn flares  Inflamed seborrheic keratosis L post shoulder x 1 Symptomatic, irritating, patient would like treated. Destruction of lesion - L post shoulder x 1 Complexity: simple   Destruction method: cryotherapy   Informed consent: discussed  and consent obtained   Timeout:  patient name, date of birth, surgical site, and procedure verified Lesion destroyed using liquid nitrogen: Yes   Region frozen until ice ball extended beyond lesion: Yes   Outcome: patient tolerated procedure well with no complications   Post-procedure details: wound care instructions given    Actinic Damage - chronic, secondary to cumulative UV radiation exposure/sun exposure over time - diffuse scaly erythematous macules with underlying dyspigmentation - Recommend daily broad spectrum sunscreen SPF 30+ to sun-exposed areas, reapply every 2 hours as needed.  - Recommend staying in the shade or wearing long sleeves, sun glasses (UVA+UVB protection) and wide brim hats (4-inch brim around the entire circumference of the hat). - Call for new or changing lesions.   Return in about 8 months (around 09/25/2022) for Psoriasis f/u.  I, Othelia Pulling, RMA, am acting as scribe for Sarina Ser, MD . Documentation: I have reviewed the above documentation for accuracy and completeness, and I agree with the above.  Sarina Ser, MD

## 2022-01-27 ENCOUNTER — Encounter: Payer: Self-pay | Admitting: Dermatology

## 2022-01-29 ENCOUNTER — Other Ambulatory Visit: Payer: Self-pay | Admitting: Neurosurgery

## 2022-02-06 NOTE — Progress Notes (Signed)
Assessment/Plan:    1.  Essential Tremor  -she wants to get off of primidone as she is trying to find medication regimen and she is not sure if medication has been helpful.  She knows that meds for copd makes tremor worse.  She was given a weaning schedule and will wean off of the primidone over the next 3 weeks.  -On her examination today, there was virtually no tremor.  She will let us know if that changes as primidone is weaned.  2.  B12 deficiency  -On oral supplementation.  When she has stopped her supplement in the past, levels went back down.  3.  Memory loss, by history  -Patient had neurocognitive testing in July, 2021 with Dr. Melvyn Novas.  This was normal from a neurologic standpoint.  Dr. Melvyn Novas did mention that there was some evidence that her difficulties were caused by chronic pain, medication side effects (#120 percocet per month ) and generalized anxiety.  She and I discussed this in detail again today. She states that she doesn't plan to get off of any of Percocet as she uses that for her rheumatoid arthritis.  She and I discussed repeating neurocognitive testing but she declined.  My suspicion for neurodegen d/o is low.    4.  LBP  -Patient scheduled for revision surgery on August 29.  5.  I will have her follow-up with my PA in 6 months.  If she is stable at that time, she will known longer need to follow-up.  If not, I will have my PA restart her back on primidone.  Subjective:   Michelle [name] was seen today in follow up for essential tremor and memory change.  My previous records were reviewed prior to todays visit.  Primidone was increased last visit.  Tremor is a bit worse.  She was complaining about low back pain last visit.  She is scheduled for revision surgery on August 29.  She is hoping it will help the L leg numbness.  She continues to c/o memory change.    Current prescribed movement disorder medications: Primidone, 50 mg, 2/1/1 B12 supplement    ALLERGIES:    Allergies  Allergen Reactions   Zofran [Ondansetron Hcl] Other (See Comments)    Prolong QT   Ace Inhibitors Cough    Occurred with lisinopril   Levofloxacin Other (See Comments)    Felt things on her legs that were not there,made her stomach hurt   Lisinopril Cough   Sulfonamide Derivatives Hives and Itching    CURRENT MEDICATIONS:  Outpatient Encounter Medications as of 02/08/2022  Medication Sig   Accu-Chek Softclix Lancets lancets Please use to check blood sugar up to two times per day. E11.42   acetaminophen (TYLENOL) 500 MG tablet Take 1,000 mg by mouth every 6 (six) hours as needed for moderate pain or headache.   Ascorbic Acid (VITAMIN C) 1000 MG tablet Take 1,000 mg by mouth daily.   betamethasone dipropionate 0.05 % lotion Apply topically as directed. Apply to the scalp daily up to 5 days for scalp (Patient taking differently: Apply 1 application  topically daily as needed (scalp irritation). Apply to the scalp daily up to 5 days for scalp)   Blood Glucose Monitoring Suppl (ACCU-CHEK GUIDE) w/Device KIT Please use to check blood sugar up to two times per day. E11.42   Blood Glucose Monitoring Suppl (ONETOUCH VERIO) w/Device KIT Check blood glucose once a day   chlorhexidine (PERIDEX) 0.12 % solution Use as directed  15 mLs in the mouth or throat 2 (two) times daily. (Patient taking differently: Use as directed 15 mLs in the mouth or throat 2 (two) times daily as needed (mouth sores).)   clobetasol (TEMOVATE) 0.05 % external solution Apply 1 application  topically daily. To scalp up to 5 times per week   cromolyn (OPTICROM) 4 % ophthalmic solution Place 1 drop into both eyes 3 (three) times daily as needed (itchy eyes).   cyclobenzaprine (FLEXERIL) 10 MG tablet Take 1 tablet by mouth 3 (three) times daily as needed for muscle spasms.   diclofenac Sodium (VOLTAREN) 1 % GEL Apply 2 g topically 2 (two) times daily as needed (pain).   fluconazole (DIFLUCAN) 200 MG tablet Take 1 tablet  (200 mg total) by mouth as directed. (Patient taking differently: Take 200 mg by mouth daily as needed (yeast).)   fluticasone (FLONASE) 50 MCG/ACT nasal spray Place 2 sprays into both nostrils daily as needed for allergies.   fluticasone furoate-vilanterol (BREO ELLIPTA) 100-25 MCG/ACT AEPB TAKE 1 PUFF BY MOUTH EVERY DAY (Patient taking differently: Inhale 1 puff into the lungs daily. TAKE 1 PUFF BY MOUTH EVERY DAY)   gabapentin (NEURONTIN) 300 MG capsule TAKE 1 CAPSULE BY MOUTH EVERYDAY AT BEDTIME Strength: 300 mg (Patient taking differently: Take 300 mg by mouth at bedtime.)   glucose blood (ACCU-CHEK GUIDE) test strip Use to check blood sugar 2x per day. E11.42   hydrOXYzine (ATARAX) 25 MG tablet Take 1-2 tablets qhs prn itch. May make drowsy (Patient taking differently: Take 25-50 mg by mouth at bedtime as needed for itching.)   inFLIXimab (REMICADE) 100 MG injection Inject 100 mg into the vein every 7 (seven) weeks.    leflunomide (ARAVA) 10 MG tablet Take 10 mg by mouth daily.   loperamide (IMODIUM) 2 MG capsule Take 2 mg by mouth daily as needed for diarrhea or loose stools.   losartan (COZAAR) 25 MG tablet TAKE 1/2 TABLET BY MOUTH ONCE DAILY (Patient taking differently: Take 12.5 mg by mouth daily.)   metFORMIN (GLUCOPHAGE-XR) 500 MG 24 hr tablet Take 2 tablets (1,000 mg total) by mouth 2 (two) times daily.   mometasone (ELOCON) 0.1 % cream APPLY 1 APPLICATION TOPICALLY DAILY AS NEEDED (RASH). (Patient taking differently: Apply 1 Application topically daily as needed (rash).)   montelukast (SINGULAIR) 10 MG tablet TAKE 1 TABLET BY MOUTH EVERY DAY (Patient taking differently: Take 10 mg by mouth at bedtime.)   mupirocin ointment (BACTROBAN) 2 % Apply 1 Application topically daily. Qd to open sores on arms, legs, toes until healed, then prn flares   MYRBETRIQ 25 MG TB24 tablet Take 50 mg by mouth daily.   nitroGLYCERIN (NITROSTAT) 0.4 MG SL tablet Place 1 tablet (0.4 mg total) under the tongue  every 5 (five) minutes as needed.   omeprazole (PRILOSEC) 40 MG capsule Take 1 capsule (40 mg total) by mouth 2 (two) times daily.   oxyCODONE-acetaminophen (PERCOCET) 10-325 MG tablet Take 1 tablet by mouth every 6 (six) hours as needed for pain.   primidone (MYSOLINE) 50 MG tablet 2 in the AM, 1 at noon, 1 in the evening. (Patient taking differently: Take 50-100 mg by mouth See admin instructions. 2 tabs (100 mg) in the AM, 1 tab  ( 50 mg) at noon, 1 tab ( 50 mg) in the evening.)   rosuvastatin (CRESTOR) 10 MG tablet Take 1 tablet (10 mg total) by mouth daily.   sertraline (ZOLOFT) 50 MG tablet Take 1 tablet (50 mg total)  by mouth daily.   TRULICITY 3 EH/2.0NO SOPN INJECT 3MG UNDER THE SKIN ONCE A WEEK (Patient taking differently: Inject 3 mg into the skin once a week. wednesday)   VENTOLIN HFA 108 (90 Base) MCG/ACT inhaler INHALE 2 PUFFS BY MOUTH EVERY 4 HOURS AS NEEDED FOR WHEEZING OR SHORTNESS OF BREATH   vitamin B-12 (CYANOCOBALAMIN) 1000 MCG tablet Take 1,000 mcg by mouth daily.   [DISCONTINUED] cholestyramine (QUESTRAN) 4 g packet Take by mouth. (Patient not taking: Reported on 02/07/2022)   [DISCONTINUED] conjugated estrogens (PREMARIN) vaginal cream Place 1 Applicatorful vaginally daily. (Patient not taking: Reported on 02/07/2022)   [DISCONTINUED] leflunomide (ARAVA) 10 MG tablet Take 1 tablet by mouth daily. (Patient not taking: Reported on 02/07/2022)   No facility-administered encounter medications on file as of 02/08/2022.     Objective:    PHYSICAL EXAMINATION:    VITALS:   Vitals:   02/08/22 0808  BP: 130/74  Pulse: 88  SpO2: 98%  Weight: 134 lb 3.2 oz (60.9 kg)  Height: 5' 2"  (1.575 m)    Wt Readings from Last 3 Encounters:  02/08/22 134 lb 3.2 oz (60.9 kg)  02/07/22 135 lb (61.2 kg)  11/21/21 137 lb (62.1 kg)     GEN:  The patient appears stated age and is in NAD. HEENT:  Normocephalic, atraumatic.  The mucous membranes are moist. The superficial temporal  arteries are without ropiness or tenderness. CV:  RRR Lungs:  CTAB Neck/HEME:  There are no carotid bruits bilaterally.  Neurological examination:  Orientation: The patient is alert and oriented x3. Cranial nerves: There is good facial symmetry. The speech is fluent and clear. Soft palate rises symmetrically and there is no tongue deviation. Hearing is intact to conversational tone. Sensation: Sensation is intact to light touch throughout Motor: Strength is at least antigravity x4.  Movement examination: Tone: There is normal tone in the UE/LE  Abnormal movements: there is no rest tremor.  There is no postural tremor.  There is very minimal intention tremor on the left.  She has no trouble with Archimedes spirals bilaterally. Coordination:  There is no decremation with RAM's  I have reviewed and interpreted the following labs independently   Chemistry      Component Value Date/Time   NA 137 02/07/2022 0937   NA 138 08/31/2020 1033   K 4.1 02/07/2022 0937   CL 104 02/07/2022 0937   CO2 26 02/07/2022 0937   BUN 10 02/07/2022 0937   BUN 15 08/31/2020 1033   CREATININE 0.42 (L) 02/07/2022 0937   CREATININE 0.46 (L) 07/22/2016 1026      Component Value Date/Time   CALCIUM 9.5 02/07/2022 0937   ALKPHOS 109 08/31/2020 1033   AST 21 08/31/2020 1033   ALT 16 08/31/2020 1033   BILITOT 0.2 08/31/2020 1033      Lab Results  Component Value Date   WBC 9.2 02/07/2022   HGB 11.8 (L) 02/07/2022   HCT 36.7 02/07/2022   MCV 93.1 02/07/2022   PLT 245 02/07/2022   Lab Results  Component Value Date   TSH 2.360 12/06/2020     Chemistry      Component Value Date/Time   NA 137 02/07/2022 0937   NA 138 08/31/2020 1033   K 4.1 02/07/2022 0937   CL 104 02/07/2022 0937   CO2 26 02/07/2022 0937   BUN 10 02/07/2022 0937   BUN 15 08/31/2020 1033   CREATININE 0.42 (L) 02/07/2022 0937   CREATININE 0.46 (L) 07/22/2016 1026  Component Value Date/Time   CALCIUM 9.5 02/07/2022 0937    ALKPHOS 109 08/31/2020 1033   AST 21 08/31/2020 1033   ALT 16 08/31/2020 1033   BILITOT 0.2 08/31/2020 1033         Total time spent on today's visit was 33 minutes, including both face-to-face time and nonface-to-face time.  Time included that spent on review of records (prior notes available to me/labs/imaging if pertinent), discussing treatment and goals, answering patient's questions and coordinating care.  Cc:  [name], MD

## 2022-02-06 NOTE — Progress Notes (Addendum)
Surgical Instructions    Your procedure is scheduled on Tuesday August 29th.  Report to [facility] Main Entrance "A" at 5:30 A.M., then check in with the Admitting office.  Call this number if you have problems the morning of surgery:  [phone]   If you have any questions prior to your surgery date call [phone]: Open Monday-Friday 8am-4pm    Remember:  Do not eat or drink after midnight the night before your surgery     Take these medicines the morning of surgery with A SIP OF WATER: cyclobenzaprine (FLEXERIL) 10 MG tablet fluconazole (DIFLUCAN) 200 MG tablet fluticasone furoate-vilanterol (BREO ELLIPTA) 100-25 MCG/ACT AEPB, please bring with you to the hospital leflunomide (ARAVA) 10 MG tablet montelukast (SINGULAIR) 10 MG tablet MYRBETRIQ 25 MG TB24 tablet omeprazole (PRILOSEC) 40 MG capsule primidone (MYSOLINE) 50 MG tablet rosuvastatin (CRESTOR) 10 MG tablet sertraline (ZOLOFT) 50 MG tablet    IF NEEDED  acetaminophen (TYLENOL) 500 MG tablet cromolyn (OPTICROM) 4 % ophthalmic solution fluticasone (FLONASE) 50 MCG/ACT nasal spray hydrOXYzine (ATARAX) 25 MG tablet nitroGLYCERIN (NITROSTAT) 0.4 MG SL tablet oxyCODONE-acetaminophen (PERCOCET) 10-325 MG tablet VENTOLIN HFA 108 (90 Base) MCG/ACT inhaler  As of today, STOP taking any Aspirin (unless otherwise instructed by your surgeon) VOLTAREN, Aleve, Naproxen, Ibuprofen, Motrin, Advil, Goody's, BC's, all herbal medications, fish oil, and all vitamins.  WHAT DO I DO ABOUT MY DIABETES MEDICATION?   Do not take oral diabetes medicines (Metformin) the morning of surgery.  THE DAY BEFORE SURGERY, take your usual dose of Trulicity insulin.       THE MORNING OF SURGERY, DO NOT take any Trulicity insulin.  The day of surgery, do not take other diabetes injectables, including Byetta (exenatide), Bydureon (exenatide ER), Victoza (liraglutide), or Trulicity (dulaglutide).  If your CBG is greater than 220 mg/dL, you  may take  of your sliding scale (correction) dose of insulin.   HOW TO MANAGE YOUR DIABETES BEFORE AND AFTER SURGERY  Why is it important to control my blood sugar before and after surgery? Improving blood sugar levels before and after surgery helps healing and can limit problems. A way of improving blood sugar control is eating a healthy diet by:  Eating less sugar and carbohydrates  Increasing activity/exercise  Talking with your doctor about reaching your blood sugar goals High blood sugars (greater than 180 mg/dL) can raise your risk of infections and slow your recovery, so you will need to focus on controlling your diabetes during the weeks before surgery. Make sure that the doctor who takes care of your diabetes knows about your planned surgery including the date and location.  How do I manage my blood sugar before surgery? Check your blood sugar at least 4 times a day, starting 2 days before surgery, to make sure that the level is not too high or low.  Check your blood sugar the morning of your surgery when you wake up and every 2 hours until you get to the Short Stay unit.  If your blood sugar is less than 70 mg/dL, you will need to treat for low blood sugar: Do not take insulin. Treat a low blood sugar (less than 70 mg/dL) with  cup of clear juice (cranberry or apple), 4 glucose tablets, OR glucose gel. Recheck blood sugar in 15 minutes after treatment (to make sure it is greater than 70 mg/dL). If your blood sugar is not greater than 70 mg/dL on recheck, call [phone] for further instructions. Report your blood sugar to the short  stay nurse when you get to Short Stay.  If you are admitted to the hospital after surgery: Your blood sugar will be checked by the staff and you will probably be given insulin after surgery (instead of oral diabetes medicines) to make sure you have good blood sugar levels. The goal for blood sugar control after surgery is 80-180 mg/dL.  DAY OF  SURGERY          Do not wear jewelry or makeup. Do not wear lotions, powders, perfumes or deodorant. Do not shave 48 hours prior to surgery.   Do not bring valuables to the hospital. Do not wear nail polish, gel polish, artificial nails, or any other type of covering on natural nails (fingers and toes) If you have artificial nails or gel coating that need to be removed by a nail salon, please have this removed prior to surgery. Artificial nails or gel coating may interfere with anesthesia's ability to adequately monitor your vital signs.  [facility] is not responsible for any belongings or valuables.    Do NOT Smoke (Tobacco/Vaping)  24 hours prior to your procedure  If you use a CPAP at night, you may bring your mask for your overnight stay.   Contacts, glasses, hearing aids, dentures or partials may not be worn into surgery, please bring cases for these belongings   For patients admitted to the hospital, discharge time will be determined by your treatment team.   Patients discharged the day of surgery will not be allowed to drive home, and someone needs to stay with them for 24 hours.   SURGICAL WAITING ROOM VISITATION Patients having surgery or a procedure may have no more than 2 support people in the waiting area - these visitors may rotate.   Children under the age of 93 must have an adult with them who is not the patient. If the patient needs to stay at the hospital during part of their recovery, the visitor guidelines for inpatient rooms apply. Pre-op nurse will coordinate an appropriate time for 1 support person to accompany patient in pre-op.  This support person may not rotate.   Please refer to the [facility] website for the visitor guidelines for Inpatients (after your surgery is over and you are in a regular room).    Special instructions:    Oral Hygiene is also important to reduce your risk of infection.  Remember - BRUSH YOUR TEETH THE MORNING OF SURGERY WITH YOUR  REGULAR TOOTHPASTE   [place]- Preparing For Surgery  Before surgery, you can play an important role. Because skin is not sterile, your skin needs to be as free of germs as possible. You can reduce the number of germs on your skin by washing with CHG (chlorahexidine gluconate) Soap before surgery.  CHG is an antiseptic cleaner which kills germs and bonds with the skin to continue killing germs even after washing.     Please do not use if you have an allergy to CHG or antibacterial soaps. If your skin becomes reddened/irritated stop using the CHG.  Do not shave (including legs and underarms) for at least 48 hours prior to first CHG shower. It is OK to shave your face.  Please follow these instructions carefully.     Shower the NIGHT BEFORE SURGERY and the MORNING OF SURGERY with CHG Soap.   If you chose to wash your hair, wash your hair first as usual with your normal shampoo. After you shampoo, rinse your hair and body thoroughly to  remove the shampoo.  Then [employer] and genitals (private parts) with your normal soap and rinse thoroughly to remove soap.  After that Use CHG Soap as you would any other liquid soap. You can apply CHG directly to the skin and wash gently with a scrungie or a clean washcloth.   Apply the CHG Soap to your body ONLY FROM THE NECK DOWN.  Do not use on open wounds or open sores. Avoid contact with your eyes, ears, mouth and genitals (private parts). Wash Face and genitals (private parts)  with your normal soap.   Wash thoroughly, paying special attention to the area where your surgery will be performed.  Thoroughly rinse your body with warm water from the neck down.  DO NOT shower/wash with your normal soap after using and rinsing off the CHG Soap.  Pat yourself dry with a CLEAN TOWEL.  Wear CLEAN PAJAMAS to bed the night before surgery  Place CLEAN SHEETS on your bed the night before your surgery  DO NOT SLEEP WITH PETS.   Day of Surgery:  Take a  shower with CHG soap. Wear Clean/Comfortable clothing the morning of surgery Do not apply any deodorants/lotions.   Remember to brush your teeth WITH YOUR REGULAR TOOTHPASTE.    If you received a COVID test during your pre-op visit, it is requested that you wear a mask when out in public, stay away from anyone that may not be feeling well, and notify your surgeon if you develop symptoms. If you have been in contact with anyone that has tested positive in the last 10 days, please notify your surgeon.    Please read over the following fact sheets that you were given.

## 2022-02-07 ENCOUNTER — Encounter (HOSPITAL_COMMUNITY): Payer: Self-pay

## 2022-02-07 ENCOUNTER — Other Ambulatory Visit: Payer: Self-pay

## 2022-02-07 ENCOUNTER — Encounter (HOSPITAL_COMMUNITY)
Admission: RE | Admit: 2022-02-07 | Discharge: 2022-02-07 | Disposition: A | Discharge status: Home / Self Care | Payer: Medicare Other | Source: Ambulatory Visit | Attending: Neurosurgery | Admitting: Neurosurgery

## 2022-02-07 VITALS — BP 120/76 | HR 92 | Temp 98.0°F | Resp 18 | Ht 61.0 in | Wt 135.0 lb

## 2022-02-07 DIAGNOSIS — Z01818 Encounter for other preprocedural examination: Secondary | ICD-10-CM | POA: Diagnosis not present

## 2022-02-07 DIAGNOSIS — E119 Type 2 diabetes mellitus without complications: Secondary | ICD-10-CM | POA: Insufficient documentation

## 2022-02-07 DIAGNOSIS — I2510 Atherosclerotic heart disease of native coronary artery without angina pectoris: Secondary | ICD-10-CM | POA: Insufficient documentation

## 2022-02-07 HISTORY — DX: Angina pectoris, unspecified: I20.9

## 2022-02-07 LAB — HEMOGLOBIN A1C
Hgb A1c MFr Bld: 6.6 % — ABNORMAL HIGH (ref 4.8–5.6)
Mean Plasma Glucose: 142.72 mg/dL

## 2022-02-07 LAB — BASIC METABOLIC PANEL
Anion gap: 7 (ref 5–15)
BUN: 10 mg/dL (ref 8–23)
CO2: 26 mmol/L (ref 22–32)
Calcium: 9.5 mg/dL (ref 8.9–10.3)
Chloride: 104 mmol/L (ref 98–111)
Creatinine, Ser: 0.42 mg/dL — ABNORMAL LOW (ref 0.44–1.00)
GFR, Estimated: >60 mL/min (ref >60)
Glucose, Bld: 122 mg/dL — ABNORMAL HIGH (ref 70–99)
Potassium: 4.1 mmol/L (ref 3.5–5.1)
Sodium: 137 mmol/L (ref 135–145)

## 2022-02-07 LAB — TYPE AND SCREEN
ABO/RH(D): A POS
Antibody Screen: NEGATIVE

## 2022-02-07 LAB — CBC
HCT: 36.7 % (ref 36.0–46.0)
Hemoglobin: 11.8 g/dL — ABNORMAL LOW (ref 12.0–15.0)
MCH: 29.9 pg (ref 26.0–34.0)
MCHC: 32.2 g/dL (ref 30.0–36.0)
MCV: 93.1 fL (ref 80.0–100.0)
Platelets: 245 10*3/uL (ref 150–400)
RBC: 3.94 MIL/uL (ref 3.87–5.11)
RDW: 14.4 % (ref 11.5–15.5)
WBC: 9.2 10*3/uL (ref 4.0–10.5)
nRBC: 0 % (ref 0.0–0.2)

## 2022-02-07 LAB — SURGICAL PCR SCREEN
MRSA, PCR: NEGATIVE
Staphylococcus aureus: POSITIVE — AB

## 2022-02-07 LAB — GLUCOSE, CAPILLARY: Glucose-Capillary: 132 mg/dL — ABNORMAL HIGH (ref 70–99)

## 2022-02-07 NOTE — Pre-Procedure Instructions (Signed)
Surgical Instructions    Your procedure is scheduled on February 26, 2022.  Report to [facility] Main Entrance "A" at 5:30 A.M., then check in with the Admitting office.  Call this number if you have problems the morning of surgery:  [phone]   If you have any questions prior to your surgery date call [phone]: Open Monday-Friday 8am-4pm    Remember:  Do not eat or drink after midnight the night before your surgery      Take these medicines the morning of surgery with A SIP OF WATER:  fluticasone furoate-vilanterol (BREO ELLIPTA)   gabapentin (NEURONTIN)   montelukast (SINGULAIR)   MYRBETRIQ   omeprazole (PRILOSEC)   rosuvastatin (CRESTOR)   sertraline (ZOLOFT)  leflunomide (ARAVA) - call Dr. Kathyrn Sheriff for recommendations on this medication   Take these medicines the morning of surgery AS NEEDED:  acetaminophen (TYLENOL)   chlorhexidine (PERIDEX)   cromolyn (OPTICROM)  cyclobenzaprine (FLEXERIL)   fluticasone (FLONASE)   hydrOXYzine (ATARAX)   nitroGLYCERIN (NITROSTAT) - please let hospital staff know if you take this morning of surgery  oxyCODONE-acetaminophen (PERCOCET)   VENTOLIN HFA 108 (90 Base)   As of today, STOP taking any Aspirin (unless otherwise instructed by your surgeon) Aleve, Naproxen, Ibuprofen, Motrin, Advil, Goody's, BC's, all herbal medications, fish oil, and all vitamins. This includes your medication: diclofenac Sodium (VOLTAREN) Gel           WHAT DO I DO ABOUT MY DIABETES MEDICATION?   Do not take metFORMIN (GLUCOPHAGE-XR) the morning of surgery.   The day of surgery, do not take Trulicity (dulaglutide).    HOW TO MANAGE YOUR DIABETES BEFORE AND AFTER SURGERY  Why is it important to control my blood sugar before and after surgery? Improving blood sugar levels before and after surgery helps healing and can limit problems. A way of improving blood sugar control is eating a healthy diet by:  Eating less sugar and carbohydrates   Increasing activity/exercise  Talking with your doctor about reaching your blood sugar goals High blood sugars (greater than 180 mg/dL) can raise your risk of infections and slow your recovery, so you will need to focus on controlling your diabetes during the weeks before surgery. Make sure that the doctor who takes care of your diabetes knows about your planned surgery including the date and location.  How do I manage my blood sugar before surgery? Check your blood sugar at least 4 times a day, starting 2 days before surgery, to make sure that the level is not too high or low.  Check your blood sugar the morning of your surgery when you wake up and every 2 hours until you get to the Short Stay unit.  If your blood sugar is less than 70 mg/dL, you will need to treat for low blood sugar: Do not take insulin. Treat a low blood sugar (less than 70 mg/dL) with  cup of clear juice (cranberry or apple), 4 glucose tablets, OR glucose gel. Recheck blood sugar in 15 minutes after treatment (to make sure it is greater than 70 mg/dL). If your blood sugar is not greater than 70 mg/dL on recheck, call [phone] for further instructions. Report your blood sugar to the short stay nurse when you get to Short Stay.  If you are admitted to the hospital after surgery: Your blood sugar will be checked by the staff and you will probably be given insulin after surgery (instead of oral diabetes medicines) to make sure you have good blood  sugar levels. The goal for blood sugar control after surgery is 80-180 mg/dL.             Do NOT Smoke (Tobacco/Vaping) for 24 hours prior to your procedure.  If you use a CPAP at night, you may bring your mask/headgear for your overnight stay.   Contacts, glasses, piercing's, hearing aid's, dentures or partials may not be worn into surgery, please bring cases for these belongings.    For patients admitted to the hospital, discharge time will be determined by your treatment  team.   Patients discharged the day of surgery will not be allowed to drive home, and someone needs to stay with them for 24 hours.  SURGICAL WAITING ROOM VISITATION Patients having surgery or a procedure may have no more than 2 support people in the waiting area - these visitors may rotate.   Children under the age of 35 must have an adult with them who is not the patient. If the patient needs to stay at the hospital during part of their recovery, the visitor guidelines for inpatient rooms apply. Pre-op nurse will coordinate an appropriate time for 1 support person to accompany patient in pre-op.  This support person may not rotate.   Please refer to the [facility] website for the visitor guidelines for Inpatients (after your surgery is over and you are in a regular room).    Special instructions:   [facility]- Preparing For Surgery  Before surgery, you can play an important role. Because skin is not sterile, your skin needs to be as free of germs as possible. You can reduce the number of germs on your skin by washing with CHG (chlorahexidine gluconate) Soap before surgery.  CHG is an antiseptic cleaner which kills germs and bonds with the skin to continue killing germs even after washing.    Oral Hygiene is also important to reduce your risk of infection.  Remember - BRUSH YOUR TEETH THE MORNING OF SURGERY WITH YOUR REGULAR TOOTHPASTE  Please do not use if you have an allergy to CHG or antibacterial soaps. If your skin becomes reddened/irritated stop using the CHG.  Do not shave (including legs and underarms) for at least 48 hours prior to first CHG shower. It is OK to shave your face.  Please follow these instructions carefully.   Shower the NIGHT BEFORE SURGERY and the MORNING OF SURGERY  If you chose to wash your hair, wash your hair first as usual with your normal shampoo.  After you shampoo, rinse your hair and body thoroughly to remove the shampoo.  Use CHG Soap as you would  any other liquid soap. You can apply CHG directly to the skin and wash gently with a scrungie or a clean washcloth.   Apply the CHG Soap to your body ONLY FROM THE NECK DOWN.  Do not use on open wounds or open sores. Avoid contact with your eyes, ears, mouth and genitals (private parts). Wash Face and genitals (private parts)  with your normal soap.   Wash thoroughly, paying special attention to the area where your surgery will be performed.  Thoroughly rinse your body with warm water from the neck down.  DO NOT shower/wash with your normal soap after using and rinsing off the CHG Soap.  Pat yourself dry with a CLEAN TOWEL.  Wear CLEAN PAJAMAS to bed the night before surgery  Place CLEAN SHEETS on your bed the night before your surgery  DO NOT SLEEP WITH PETS.   Day of  Surgery: Take a shower with CHG soap. Do not wear jewelry or makeup Do not wear lotions, powders, perfumes/colognes, or deodorant. Do not shave 48 hours prior to surgery.  Men may shave face and neck. Do not bring valuables to the hospital.  [facility] is not responsible for any belongings or valuables. Do not wear nail polish, gel polish, artificial nails, or any other type of covering on natural nails (fingers and toes) If you have artificial nails or gel coating that need to be removed by a nail salon, please have this removed prior to surgery. Artificial nails or gel coating may interfere with anesthesia's ability to adequately monitor your vital signs. Wear Clean/Comfortable clothing the morning of surgery Remember to brush your teeth WITH YOUR REGULAR TOOTHPASTE.   Please read over the following fact sheets that you were given.    If you received a COVID test during your pre-op visit  it is requested that you wear a mask when out in public, stay away from anyone that may not be feeling well and notify your surgeon if you develop symptoms. If you have been in contact with anyone that has tested positive in the  last 10 days please notify you surgeon.

## 2022-02-07 NOTE — Progress Notes (Signed)
PCP - Dr. [name] Cardiologist - Dr. Einar Gip  PPM/ICD - Denies Device Orders - n/a Rep Notified - n/a  Chest x-ray -  EKG - 02/07/2022 Stress Test - 01/31/2012 ECHO - 2016 Cardiac Cath - 08/07/2013  Sleep Study - 01/31/2012 - per patient, results were negative CPAP - denies  DM Tyle 2 CBG 132 at PAT appointment. Pt only checks CBG when she feels symptomatic. Usual blood sugars when checked are in 130s.  Blood Thinner Instructions: n/a Aspirin Instructions: Stop taking asa today  NPO after midnight  COVID TEST- n/a   Anesthesia review: Yes. Discussed with Karoline Caldwell, PA-C. Last Cardiology office note and ECHO results requested at PAT appointment for review.  Patient denies shortness of breath, fever, cough and chest pain at PAT appointment   All instructions explained to the patient, with a verbal understanding of the material. Patient agrees to go over the instructions while at home for a better understanding. Patient also instructed to self quarantine after being tested for COVID-19. The opportunity to ask questions was provided.

## 2022-02-08 ENCOUNTER — Ambulatory Visit (INDEPENDENT_AMBULATORY_CARE_PROVIDER_SITE_OTHER): Payer: Medicare Other | Admitting: Neurology

## 2022-02-08 ENCOUNTER — Encounter: Payer: Self-pay | Admitting: Neurology

## 2022-02-08 VITALS — BP 130/74 | HR 88 | Ht 62.0 in | Wt 134.2 lb

## 2022-02-08 DIAGNOSIS — R251 Tremor, unspecified: Secondary | ICD-10-CM

## 2022-02-08 DIAGNOSIS — M5416 Radiculopathy, lumbar region: Secondary | ICD-10-CM | POA: Diagnosis not present

## 2022-02-08 DIAGNOSIS — R413 Other amnesia: Secondary | ICD-10-CM

## 2022-02-08 DIAGNOSIS — I2510 Atherosclerotic heart disease of native coronary artery without angina pectoris: Secondary | ICD-10-CM

## 2022-02-08 NOTE — Patient Instructions (Addendum)
Decrease primidone to 1 tablet three times per day for a week and then 1 twice a day for a week and then 1 tablet once a day for a week and then STOP primidone  The physicians and staff at [facility] are committed to providing excellent care. You may receive a survey requesting feedback about your experience at our office. We strive to receive "very good" responses to the survey questions. If you feel that your experience would prevent you from giving the office a "very good " response, please contact our office to try to remedy the situation. We may be reached at [phone]. Thank you for taking the time out of your busy day to complete the survey.

## 2022-02-10 ENCOUNTER — Other Ambulatory Visit: Payer: Self-pay | Admitting: Neurology

## 2022-02-12 NOTE — Progress Notes (Signed)
Anesthesia Chart Review:  Patient previously evaluated by cardiologist Dr. Einar Gip for reported chest pain.  She ultimately had heart cath 08/17/2013 which showed no significant CAD, mild diffuse luminal irregularities constituting 10 to 20%, right dominant circulation.  EF 50%.  Patient was last seen by Dr. Einar Gip 08/30/2013.  Per note, "Patient presents here for follow-up of chest pain and also shortness of breath.  I reviewed the results of the stress test and also angiography with the patient and reassured her.  She has not had any further chest pain since coronary angiography.  I suspect her shortness of breath is due to bronchial asthma.  With regard to CAD risk factors, patient is presently on appropriate medical therapy.  I advised her to follow-up with her PCP for further management of her medical issues and I will see her back on a when necessary basis."  Follows with pulmonologist Dr. Regan Lemming at [facility] for history of cough variant asthma maintained on Dulera.  She has a history of bilateral very small lung nodules stable by serial CT.  Follows with rheumatology at [facility] for history of seropositive rheumatoid arthritis, maintained on Areva daily and Renflexis infusion once every 8 weeks.  Non-insulin-dependent DM2, well controlled, A1c 6.6 on preop labs.  Remainder of preop labs reviewed, unremarkable.  EKG 02/07/2022: NSR.  Rate 87.  CT chest 12/05/2016 (Care Everywhere): FINDINGS:  AIRWAYS, LUNGS, PLEURA:  Clear central airways.  Bilateral sub-5 mm pulmonary nodules are unchanged (2:24, 40, 48 and 49). No new nodule.  No pleural effusion.   MEDIASTINUM:  Normal heart size. Coronary atherosclerosis. No pericardial effusion.  Normal caliber thoracic aorta with scattered calcifications.  No lymphadenopathy.  IMAGED ABDOMEN: Cholecystectomy.  SOFT TISSUES: Unremarkable.  BONES: Degenerative disc disease.   IMPRESSION:  Bilateral sub-5 mm pulmonary nodules, unchanged dating back to 8 2015. No  new nodule.  Right/left heart cath 08/17/2013: IMPRESSIONS:  1. No significant coronary artery disease, mild diffuse luminal irregularity constituting around 10-20%. right dominant circulation. LVEF: Low normal LVEF, 50% with mild global hypokinesis. 2. Right heart cath revealing: Normal right heart pressure, no evidence of pulmonary hypertension, result cardio output and cardiac index. QP/QS was normal at 0.9 suggesting no significant right to left shunting.   TTE 08/11/2013: Conclusion: 1.  Poor echo window.  Wall motion abnormality may be missed.  Left ventricular cavity is normal in size.  Mild concentric hypertrophy.  Diastolic filling with impaired relaxation pattern and normal to low pressure.  Mild global hypokinesis.  Mildly decreased systolic global function.  Calculated EF 56%.  Visual EF is 40 to 45%.  Doppler evidence of grade 1 diastolic dysfunction. 2.  Trace MR, TR (no pulmonary hypertension) and trace PI.    Wynonia Musty [facility] Short Stay Center/Anesthesiology Phone [phone] 02/12/2022 10:21 AM

## 2022-02-12 NOTE — Anesthesia Preprocedure Evaluation (Addendum)
Anesthesia Evaluation  Patient identified by MRN, date of birth, ID band Patient awake    Reviewed: Allergy & Precautions, NPO status , Patient's Chart, lab work & pertinent test results  Airway Mallampati: II  TM Distance: >3 FB Neck ROM: Full    Dental  (+) Poor Dentition   Pulmonary asthma , former smoker,    Pulmonary exam normal        Cardiovascular hypertension, + angina  Rhythm:Regular Rate:Normal     Neuro/Psych Anxiety    GI/Hepatic Neg liver ROS, GERD  Medicated,  Endo/Other  diabetes, Type 2  Renal/GU negative Renal ROS     Musculoskeletal  (+) Arthritis , Rheumatoid disorders,    Abdominal Normal abdominal exam  (+)   Peds  Hematology  (+) Blood dyscrasia, anemia ,   Anesthesia Other Findings   Reproductive/Obstetrics                           Anesthesia Physical Anesthesia Plan  ASA: 2  Anesthesia Plan: General   Post-op Pain Management: Celebrex PO (pre-op)*, Ketamine IV* and Tylenol PO (pre-op)*   Induction: Intravenous  PONV Risk Score and Plan: 3 and Dexamethasone, Treatment may vary due to age or medical condition and Propofol infusion  Airway Management Planned: Mask and Oral ETT  Additional Equipment: None  Intra-op Plan:   Post-operative Plan: Extubation in OR  Informed Consent: I have reviewed the patients History and Physical, chart, labs and discussed the procedure including the risks, benefits and alternatives for the proposed anesthesia with the patient or authorized representative who has indicated his/her understanding and acceptance.     Dental advisory given  Plan Discussed with: CRNA  Anesthesia Plan Comments: (PAT note by Karoline Caldwell, PA-C: Patient previously evaluated by cardiologist Dr. Einar Gip for reported chest pain.  She ultimately had heart cath 08/17/2013 which showed no significant CAD, mild diffuse luminal irregularities constituting  10 to 20%, right dominant circulation.  EF 50%.  Patient was last seen by Dr. Einar Gip 08/30/2013.  Per note, "Patient presents here for follow-up of chest pain and also shortness of breath.  I reviewed the results of the stress test and also angiography with the patient and reassured her.  She has not had any further chest pain since coronary angiography.  I suspect her shortness of breath is due to bronchial asthma.  With regard to CAD risk factors, patient is presently on appropriate medical therapy.  I advised her to follow-up with her PCP for further management of her medical issues and I will see her back on a when necessary basis."  Follows with pulmonologist Dr. Regan Lemming at [facility] for history of cough variant asthma maintained on Dulera.  She has a history of bilateral very small lung nodules stable by serial CT.  Follows with rheumatology at [facility] for history of seropositive rheumatoid arthritis, maintained on Areva daily and Renflexis infusion once every 8 weeks.  Non-insulin-dependent DM2, well controlled, A1c 6.6 on preop labs.  Remainder of preop labs reviewed, unremarkable.  EKG 02/07/2022: NSR.  Rate 87.  CT chest 12/05/2016 (Care Everywhere): FINDINGS:  AIRWAYS, LUNGS, PLEURA:  Clear central airways.  Bilateral sub-5 mm pulmonary nodules are unchanged (2:24, 40, 48 and 49). No new nodule.  No pleural effusion.   MEDIASTINUM:  Normal heart size. Coronary atherosclerosis. No pericardial effusion.  Normal caliber thoracic aorta with scattered calcifications.  No lymphadenopathy.  IMAGED ABDOMEN: Cholecystectomy.  SOFT TISSUES: Unremarkable.  BONES: Degenerative disc  disease.   IMPRESSION:  Bilateral sub-5 mm pulmonary nodules, unchanged dating back to 8 2015. No new nodule.  Right/left heart cath 08/17/2013: IMPRESSIONS:  1. No significant coronary artery disease, mild diffuse luminal irregularity constituting around 10-20%. right dominant circulation. LVEF: Low normal LVEF, 50% with  mild global hypokinesis. 2. Right heart cath revealing: Normal right heart pressure, no evidence of pulmonary hypertension, result cardio output and cardiac index. QP/QS was normal at 0.9 suggesting no significant right to left shunting.   TTE 08/11/2013: Conclusion: 1.  Poor echo window.  Wall motion abnormality may be missed.  Left ventricular cavity is normal in size.  Mild concentric hypertrophy.  Diastolic filling with impaired relaxation pattern and normal to low pressure.  Mild global hypokinesis.  Mildly decreased systolic global function.  Calculated EF 56%.  Visual EF is 40 to 45%.  Doppler evidence of grade 1 diastolic dysfunction. 2.  Trace MR, TR (no pulmonary hypertension) and trace PI. )      Anesthesia Quick Evaluation

## 2022-02-18 ENCOUNTER — Ambulatory Visit: Payer: Medicare Other | Admitting: Neurology

## 2022-02-18 NOTE — Progress Notes (Unsigned)
    SUBJECTIVE:   CHIEF COMPLAINT / HPI:  No chief complaint on file.   ***  PERTINENT  PMH / PSH: ***  Patient Care Team: [name], MD as PCP - General (Family Medicine) Alda Berthold, DO as Consulting Physician (Neurology)   OBJECTIVE:   There were no vitals taken for this visit.  Physical Exam      11/21/2021    1:34 PM  Depression screen PHQ 2/9  Decreased Interest 0  Down, Depressed, Hopeless 0  PHQ - 2 Score 0  Altered sleeping 0  Tired, decreased energy 0  Change in appetite 0  Feeling bad or failure about yourself  0  Trouble concentrating 0  Moving slowly or fidgety/restless 0  Suicidal thoughts 0  PHQ-9 Score 0  Difficult doing work/chores Somewhat difficult     {Show previous vital signs (optional):23777}  {Labs  Heme  Chem  Endocrine  Serology  Results Review (optional):23779}  ASSESSMENT/PLAN:   No problem-specific Assessment & Plan notes found for this encounter.    No follow-ups on file.   [name], MD [place]

## 2022-02-19 ENCOUNTER — Ambulatory Visit (INDEPENDENT_AMBULATORY_CARE_PROVIDER_SITE_OTHER): Payer: Medicare Other | Admitting: Family Medicine

## 2022-02-19 ENCOUNTER — Encounter: Payer: Self-pay | Admitting: Family Medicine

## 2022-02-19 VITALS — BP 117/78 | HR 101 | Ht 62.0 in | Wt 133.2 lb

## 2022-02-19 DIAGNOSIS — E1142 Type 2 diabetes mellitus with diabetic polyneuropathy: Secondary | ICD-10-CM

## 2022-02-19 DIAGNOSIS — G8929 Other chronic pain: Secondary | ICD-10-CM | POA: Diagnosis not present

## 2022-02-19 DIAGNOSIS — I2510 Atherosclerotic heart disease of native coronary artery without angina pectoris: Secondary | ICD-10-CM | POA: Diagnosis not present

## 2022-02-19 MED ORDER — OXYCODONE-ACETAMINOPHEN 10-325 MG PO TABS: 1.0000 | ORAL_TABLET | Freq: Four times a day (QID) | ORAL | 0 refills | Status: DC | PRN

## 2022-02-19 MED ORDER — OXYCODONE-ACETAMINOPHEN 10-325 MG PO TABS: 1.0000 | ORAL_TABLET | Freq: Three times a day (TID) | ORAL | 0 refills | Status: DC | PRN

## 2022-02-19 NOTE — Assessment & Plan Note (Signed)
PDMP reviewed.  Will give additional 3 months of oxycodone-acetaminophen.

## 2022-02-19 NOTE — Patient Instructions (Addendum)
It was nice seeing you today!  Good luck with your surgery!  Come back to see me in 3 months.  Stay well, [name], MD [facility] [phone]  --  Make sure to check out at the front desk before you leave today.  Please arrive at least 15 minutes prior to your scheduled appointments.  If you had blood work today, I will send you a MyChart message or a letter if results are normal. Otherwise, I will give you a call.  If you had a referral placed, they will call you to set up an appointment. Please give [place] a call if you don't hear back in the next 2 weeks.  If you need additional refills before your next appointment, please call your pharmacy first.

## 2022-02-19 NOTE — Assessment & Plan Note (Signed)
Well-controlled on most recent A1c check preop, no change to current medications

## 2022-02-26 ENCOUNTER — Inpatient Hospital Stay (HOSPITAL_COMMUNITY): Payer: Medicare Other | Admitting: Physician Assistant

## 2022-02-26 ENCOUNTER — Encounter (HOSPITAL_COMMUNITY): Payer: Self-pay | Admitting: Neurosurgery

## 2022-02-26 ENCOUNTER — Encounter (HOSPITAL_COMMUNITY)
Admission: RE | Disposition: A | Discharge status: Home / Self Care | Payer: Self-pay | Source: Home / Self Care | Attending: Neurosurgery

## 2022-02-26 ENCOUNTER — Other Ambulatory Visit: Payer: Self-pay

## 2022-02-26 ENCOUNTER — Inpatient Hospital Stay (HOSPITAL_COMMUNITY): Payer: Medicare Other

## 2022-02-26 ENCOUNTER — Inpatient Hospital Stay (HOSPITAL_COMMUNITY): Payer: Medicare Other | Admitting: Certified Registered"

## 2022-02-26 ENCOUNTER — Inpatient Hospital Stay (HOSPITAL_COMMUNITY)
Admission: RE | Admit: 2022-02-26 | Discharge: 2022-02-27 | DRG: 454 | Disposition: A | Discharge status: Home / Self Care | Payer: Medicare Other | Attending: Neurosurgery | Admitting: Neurosurgery

## 2022-02-26 DIAGNOSIS — Y838 Other surgical procedures as the cause of abnormal reaction of the patient, or of later complication, without mention of misadventure at the time of the procedure: Secondary | ICD-10-CM | POA: Diagnosis present

## 2022-02-26 DIAGNOSIS — I10 Essential (primary) hypertension: Secondary | ICD-10-CM | POA: Diagnosis present

## 2022-02-26 DIAGNOSIS — E1143 Type 2 diabetes mellitus with diabetic autonomic (poly)neuropathy: Secondary | ICD-10-CM | POA: Diagnosis present

## 2022-02-26 DIAGNOSIS — G96198 Other disorders of meninges, not elsewhere classified: Secondary | ICD-10-CM | POA: Diagnosis present

## 2022-02-26 DIAGNOSIS — M4807 Spinal stenosis, lumbosacral region: Secondary | ICD-10-CM | POA: Diagnosis present

## 2022-02-26 DIAGNOSIS — M059 Rheumatoid arthritis with rheumatoid factor, unspecified: Secondary | ICD-10-CM | POA: Diagnosis present

## 2022-02-26 DIAGNOSIS — M810 Age-related osteoporosis without current pathological fracture: Secondary | ICD-10-CM | POA: Diagnosis present

## 2022-02-26 DIAGNOSIS — M960 Pseudarthrosis after fusion or arthrodesis: Secondary | ICD-10-CM

## 2022-02-26 DIAGNOSIS — K3184 Gastroparesis: Secondary | ICD-10-CM | POA: Diagnosis present

## 2022-02-26 DIAGNOSIS — T84098A Other mechanical complication of other internal joint prosthesis, initial encounter: Secondary | ICD-10-CM | POA: Diagnosis present

## 2022-02-26 DIAGNOSIS — Z7984 Long term (current) use of oral hypoglycemic drugs: Secondary | ICD-10-CM

## 2022-02-26 DIAGNOSIS — Z87891 Personal history of nicotine dependence: Secondary | ICD-10-CM

## 2022-02-26 DIAGNOSIS — Z79899 Other long term (current) drug therapy: Secondary | ICD-10-CM

## 2022-02-26 DIAGNOSIS — Z90710 Acquired absence of both cervix and uterus: Secondary | ICD-10-CM

## 2022-02-26 DIAGNOSIS — F411 Generalized anxiety disorder: Secondary | ICD-10-CM | POA: Diagnosis present

## 2022-02-26 DIAGNOSIS — J45909 Unspecified asthma, uncomplicated: Secondary | ICD-10-CM | POA: Diagnosis present

## 2022-02-26 DIAGNOSIS — H9190 Unspecified hearing loss, unspecified ear: Secondary | ICD-10-CM | POA: Diagnosis present

## 2022-02-26 DIAGNOSIS — J320 Chronic maxillary sinusitis: Secondary | ICD-10-CM | POA: Diagnosis present

## 2022-02-26 DIAGNOSIS — G2581 Restless legs syndrome: Secondary | ICD-10-CM | POA: Diagnosis present

## 2022-02-26 DIAGNOSIS — S32009K Unspecified fracture of unspecified lumbar vertebra, subsequent encounter for fracture with nonunion: Secondary | ICD-10-CM | POA: Diagnosis present

## 2022-02-26 DIAGNOSIS — Z9842 Cataract extraction status, left eye: Secondary | ICD-10-CM

## 2022-02-26 DIAGNOSIS — Z9049 Acquired absence of other specified parts of digestive tract: Secondary | ICD-10-CM | POA: Diagnosis not present

## 2022-02-26 DIAGNOSIS — K219 Gastro-esophageal reflux disease without esophagitis: Secondary | ICD-10-CM | POA: Diagnosis present

## 2022-02-26 DIAGNOSIS — J321 Chronic frontal sinusitis: Secondary | ICD-10-CM | POA: Diagnosis present

## 2022-02-26 DIAGNOSIS — Z974 Presence of external hearing-aid: Secondary | ICD-10-CM

## 2022-02-26 DIAGNOSIS — Z882 Allergy status to sulfonamides status: Secondary | ICD-10-CM

## 2022-02-26 DIAGNOSIS — Z86007 Personal history of in-situ neoplasm of skin: Secondary | ICD-10-CM | POA: Diagnosis not present

## 2022-02-26 DIAGNOSIS — E785 Hyperlipidemia, unspecified: Secondary | ICD-10-CM | POA: Diagnosis present

## 2022-02-26 DIAGNOSIS — Z881 Allergy status to other antibiotic agents status: Secondary | ICD-10-CM

## 2022-02-26 DIAGNOSIS — E119 Type 2 diabetes mellitus without complications: Secondary | ICD-10-CM

## 2022-02-26 DIAGNOSIS — Z85828 Personal history of other malignant neoplasm of skin: Secondary | ICD-10-CM

## 2022-02-26 DIAGNOSIS — Z888 Allergy status to other drugs, medicaments and biological substances status: Secondary | ICD-10-CM

## 2022-02-26 LAB — GLUCOSE, CAPILLARY
Glucose-Capillary: 131 mg/dL — ABNORMAL HIGH (ref 70–99)
Glucose-Capillary: 133 mg/dL — ABNORMAL HIGH (ref 70–99)
Glucose-Capillary: 156 mg/dL — ABNORMAL HIGH (ref 70–99)
Glucose-Capillary: 215 mg/dL — ABNORMAL HIGH (ref 70–99)
Glucose-Capillary: 171 mg/dL — ABNORMAL HIGH (ref 70–99)

## 2022-02-26 SURGERY — POSTERIOR LUMBAR FUSION 1 LEVEL
Anesthesia: General | Site: Spine Lumbar

## 2022-02-26 MED ORDER — SODIUM CHLORIDE 0.9% FLUSH: 3.0000 mL | INTRAVENOUS | Status: DC | PRN

## 2022-02-26 MED ORDER — MONTELUKAST SODIUM 10 MG PO TABS
10.0000 mg | ORAL_TABLET | Freq: Every day | ORAL | Status: DC
  Administered 2022-02-26: 10 mg via ORAL
  Filled 2022-02-26: qty 1

## 2022-02-26 MED ORDER — SODIUM CHLORIDE 0.9% FLUSH
3.0000 mL | Freq: Two times a day (BID) | INTRAVENOUS | Status: DC
  Administered 2022-02-26: 3 mL via INTRAVENOUS

## 2022-02-26 MED ORDER — LIDOCAINE-EPINEPHRINE 1 %-1:100000 IJ SOLN
INTRAMUSCULAR | Status: DC | PRN
  Administered 2022-02-26: 8 mL

## 2022-02-26 MED ORDER — PRIMIDONE 50 MG PO TABS
50.0000 mg | ORAL_TABLET | Freq: Every evening | ORAL | Status: DC
  Filled 2022-02-26: qty 1

## 2022-02-26 MED ORDER — OXYCODONE HCL 5 MG PO TABS: 5.0000 mg | ORAL_TABLET | ORAL | Status: DC | PRN

## 2022-02-26 MED ORDER — NITROGLYCERIN 0.4 MG SL SUBL: 0.4000 mg | SUBLINGUAL_TABLET | SUBLINGUAL | Status: DC | PRN

## 2022-02-26 MED ORDER — INSULIN ASPART 100 UNIT/ML IJ SOLN
0.0000 [IU] | Freq: Three times a day (TID) | INTRAMUSCULAR | Status: DC
  Administered 2022-02-26: 5 [IU] via SUBCUTANEOUS

## 2022-02-26 MED ORDER — PROPOFOL 10 MG/ML IV BOLUS
INTRAVENOUS | Status: DC | PRN
  Administered 2022-02-26: 90 mg via INTRAVENOUS

## 2022-02-26 MED ORDER — ONDANSETRON HCL 4 MG PO TABS
4.0000 mg | ORAL_TABLET | Freq: Four times a day (QID) | ORAL | Status: DC | PRN
  Administered 2022-02-26: 4 mg via ORAL
  Filled 2022-02-26: qty 1

## 2022-02-26 MED ORDER — THROMBIN 5000 UNITS EX SOLR
CUTANEOUS | Status: AC
  Filled 2022-02-26: qty 5000

## 2022-02-26 MED ORDER — PHENOL 1.4 % MT LIQD: 1.0000 | OROMUCOSAL | Status: DC | PRN

## 2022-02-26 MED ORDER — METHOCARBAMOL 500 MG PO TABS
500.0000 mg | ORAL_TABLET | Freq: Four times a day (QID) | ORAL | Status: DC | PRN
  Administered 2022-02-26: 500 mg via ORAL
  Filled 2022-02-26: qty 1

## 2022-02-26 MED ORDER — PANTOPRAZOLE SODIUM 40 MG IV SOLR
40.0000 mg | Freq: Every day | INTRAVENOUS | Status: DC
  Administered 2022-02-26: 40 mg via INTRAVENOUS
  Filled 2022-02-26: qty 10

## 2022-02-26 MED ORDER — DEXAMETHASONE SODIUM PHOSPHATE 10 MG/ML IJ SOLN
INTRAMUSCULAR | Status: DC | PRN
  Administered 2022-02-26: 5 mg via INTRAVENOUS

## 2022-02-26 MED ORDER — INSULIN ASPART 100 UNIT/ML IJ SOLN: 0.0000 [IU] | Freq: Three times a day (TID) | INTRAMUSCULAR | Status: DC

## 2022-02-26 MED ORDER — THROMBIN 5000 UNITS EX SOLR: OROMUCOSAL | Status: DC | PRN

## 2022-02-26 MED ORDER — ALBUTEROL SULFATE (2.5 MG/3ML) 0.083% IN NEBU
3.0000 mL | INHALATION_SOLUTION | RESPIRATORY_TRACT | Status: DC
Start: 2022-02-26 — End: 2022-02-26
  Administered 2022-02-26: 3 mL via RESPIRATORY_TRACT
  Filled 2022-02-26: qty 3

## 2022-02-26 MED ORDER — FENTANYL CITRATE (PF) 100 MCG/2ML IJ SOLN
25.0000 ug | INTRAMUSCULAR | Status: DC | PRN
  Administered 2022-02-26: 50 ug via INTRAVENOUS

## 2022-02-26 MED ORDER — MORPHINE SULFATE (PF) 2 MG/ML IV SOLN: 2.0000 mg | INTRAVENOUS | Status: DC | PRN

## 2022-02-26 MED ORDER — ACETAMINOPHEN 500 MG PO TABS
1000.0000 mg | ORAL_TABLET | Freq: Once | ORAL | Status: AC
  Administered 2022-02-26: 1000 mg via ORAL
  Filled 2022-02-26: qty 2

## 2022-02-26 MED ORDER — SENNA 8.6 MG PO TABS
1.0000 | ORAL_TABLET | Freq: Two times a day (BID) | ORAL | Status: DC
Start: 2022-02-26 — End: 2022-02-27

## 2022-02-26 MED ORDER — VITAMIN C 500 MG PO TABS: 1000.0000 mg | ORAL_TABLET | Freq: Every day | ORAL | Status: DC

## 2022-02-26 MED ORDER — KETOROLAC TROMETHAMINE 0.5 % OP SOLN
1.0000 [drp] | Freq: Four times a day (QID) | OPHTHALMIC | Status: DC
  Administered 2022-02-26 (×2): 1 [drp] via OPHTHALMIC
  Filled 2022-02-26: qty 5

## 2022-02-26 MED ORDER — KETAMINE HCL 50 MG/5ML IJ SOSY
PREFILLED_SYRINGE | INTRAMUSCULAR | Status: AC
  Filled 2022-02-26: qty 5

## 2022-02-26 MED ORDER — GABAPENTIN 300 MG PO CAPS
300.0000 mg | ORAL_CAPSULE | Freq: Every day | ORAL | Status: DC
  Administered 2022-02-26: 300 mg via ORAL
  Filled 2022-02-26: qty 1

## 2022-02-26 MED ORDER — SODIUM CHLORIDE 0.9 % IV SOLN: INTRAVENOUS | Status: DC

## 2022-02-26 MED ORDER — PHENYLEPHRINE 80 MCG/ML (10ML) SYRINGE FOR IV PUSH (FOR BLOOD PRESSURE SUPPORT)
PREFILLED_SYRINGE | INTRAVENOUS | Status: DC | PRN
  Administered 2022-02-26 (×2): 80 ug via INTRAVENOUS

## 2022-02-26 MED ORDER — FLUTICASONE FUROATE-VILANTEROL 100-25 MCG/ACT IN AEPB
1.0000 | INHALATION_SPRAY | Freq: Every day | RESPIRATORY_TRACT | Status: DC
  Filled 2022-02-26: qty 28

## 2022-02-26 MED ORDER — SUGAMMADEX SODIUM 200 MG/2ML IV SOLN
INTRAVENOUS | Status: DC | PRN
  Administered 2022-02-26: 150 mg via INTRAVENOUS

## 2022-02-26 MED ORDER — CEFAZOLIN SODIUM-DEXTROSE 2-4 GM/100ML-% IV SOLN
2.0000 g | INTRAVENOUS | Status: AC
  Administered 2022-02-26: 2 g via INTRAVENOUS
  Filled 2022-02-26: qty 100

## 2022-02-26 MED ORDER — FENTANYL CITRATE (PF) 100 MCG/2ML IJ SOLN
INTRAMUSCULAR | Status: AC
  Filled 2022-02-26: qty 2

## 2022-02-26 MED ORDER — CROMOLYN SODIUM 4 % OP SOLN: 1.0000 [drp] | Freq: Three times a day (TID) | OPHTHALMIC | Status: DC | PRN

## 2022-02-26 MED ORDER — LOSARTAN POTASSIUM 25 MG PO TABS
12.5000 mg | ORAL_TABLET | Freq: Every day | ORAL | Status: DC
  Filled 2022-02-26: qty 0.5

## 2022-02-26 MED ORDER — MUPIROCIN 2 % EX OINT: 1.0000 | TOPICAL_OINTMENT | Freq: Every day | CUTANEOUS | Status: DC

## 2022-02-26 MED ORDER — KETAMINE HCL 10 MG/ML IJ SOLN
INTRAMUSCULAR | Status: DC | PRN
  Administered 2022-02-26: 10 mg via INTRAVENOUS
  Administered 2022-02-26: 20 mg via INTRAVENOUS

## 2022-02-26 MED ORDER — FENTANYL CITRATE (PF) 250 MCG/5ML IJ SOLN
INTRAMUSCULAR | Status: AC
  Filled 2022-02-26: qty 5

## 2022-02-26 MED ORDER — PRIMIDONE 50 MG PO TABS
50.0000 mg | ORAL_TABLET | Freq: Every day | ORAL | Status: DC
Start: 2022-02-27 — End: 2022-02-27
  Filled 2022-02-26: qty 1

## 2022-02-26 MED ORDER — ONDANSETRON HCL 4 MG/2ML IJ SOLN: 4.0000 mg | Freq: Four times a day (QID) | INTRAMUSCULAR | Status: DC | PRN

## 2022-02-26 MED ORDER — LEFLUNOMIDE 20 MG PO TABS
10.0000 mg | ORAL_TABLET | Freq: Every day | ORAL | Status: DC
  Filled 2022-02-26: qty 0.5

## 2022-02-26 MED ORDER — CHLORHEXIDINE GLUCONATE CLOTH 2 % EX PADS: 6.0000 | MEDICATED_PAD | Freq: Once | CUTANEOUS | Status: DC

## 2022-02-26 MED ORDER — METFORMIN HCL ER 500 MG PO TB24
1000.0000 mg | ORAL_TABLET | Freq: Two times a day (BID) | ORAL | Status: DC
  Administered 2022-02-26 – 2022-02-27 (×2): 1000 mg via ORAL
  Filled 2022-02-26 (×2): qty 2

## 2022-02-26 MED ORDER — BISACODYL 10 MG RE SUPP: 10.0000 mg | Freq: Every day | RECTAL | Status: DC | PRN

## 2022-02-26 MED ORDER — LIDOCAINE-EPINEPHRINE 1 %-1:100000 IJ SOLN
INTRAMUSCULAR | Status: AC
  Filled 2022-02-26: qty 1

## 2022-02-26 MED ORDER — METHOCARBAMOL 1000 MG/10ML IJ SOLN: 500.0000 mg | Freq: Four times a day (QID) | INTRAVENOUS | Status: DC | PRN

## 2022-02-26 MED ORDER — LIDOCAINE-EPINEPHRINE 1 %-1:100000 IJ SOLN
INTRAMUSCULAR | Status: DC | PRN
  Administered 2022-02-26: 10 mL

## 2022-02-26 MED ORDER — ROCURONIUM BROMIDE 10 MG/ML (PF) SYRINGE
PREFILLED_SYRINGE | INTRAVENOUS | Status: DC | PRN
  Administered 2022-02-26: 10 mg via INTRAVENOUS
  Administered 2022-02-26: 60 mg via INTRAVENOUS

## 2022-02-26 MED ORDER — ACETAMINOPHEN 650 MG RE SUPP: 650.0000 mg | RECTAL | Status: DC | PRN

## 2022-02-26 MED ORDER — PROPOFOL 10 MG/ML IV BOLUS
INTRAVENOUS | Status: AC
  Filled 2022-02-26: qty 20

## 2022-02-26 MED ORDER — PROPOFOL 500 MG/50ML IV EMUL
INTRAVENOUS | Status: DC | PRN
  Administered 2022-02-26: 25 ug/kg/min via INTRAVENOUS

## 2022-02-26 MED ORDER — DOCUSATE SODIUM 100 MG PO CAPS
100.0000 mg | ORAL_CAPSULE | Freq: Two times a day (BID) | ORAL | Status: DC
  Administered 2022-02-26: 100 mg via ORAL
  Filled 2022-02-26: qty 1

## 2022-02-26 MED ORDER — ORAL CARE MOUTH RINSE: 15.0000 mL | Freq: Once | OROMUCOSAL | Status: AC

## 2022-02-26 MED ORDER — INSULIN ASPART 100 UNIT/ML IJ SOLN: 0.0000 [IU] | INTRAMUSCULAR | Status: DC | PRN

## 2022-02-26 MED ORDER — 0.9 % SODIUM CHLORIDE (POUR BTL) OPTIME
TOPICAL | Status: DC | PRN
  Administered 2022-02-26: 1000 mL

## 2022-02-26 MED ORDER — SERTRALINE HCL 50 MG PO TABS
50.0000 mg | ORAL_TABLET | Freq: Every day | ORAL | Status: DC
  Administered 2022-02-26: 50 mg via ORAL
  Filled 2022-02-26: qty 1

## 2022-02-26 MED ORDER — FENTANYL CITRATE (PF) 250 MCG/5ML IJ SOLN
INTRAMUSCULAR | Status: DC | PRN
Start: 2022-02-26 — End: 2022-02-26
  Administered 2022-02-26: 25 ug via INTRAVENOUS
  Administered 2022-02-26: 50 ug via INTRAVENOUS
  Administered 2022-02-26: 25 ug via INTRAVENOUS
  Administered 2022-02-26 (×2): 50 ug via INTRAVENOUS

## 2022-02-26 MED ORDER — PRIMIDONE 50 MG PO TABS
100.0000 mg | ORAL_TABLET | Freq: Every day | ORAL | Status: DC
  Filled 2022-02-26: qty 2

## 2022-02-26 MED ORDER — ACETAMINOPHEN 325 MG PO TABS
650.0000 mg | ORAL_TABLET | ORAL | Status: DC | PRN
  Administered 2022-02-26 – 2022-02-27 (×2): 650 mg via ORAL
  Filled 2022-02-26 (×2): qty 2

## 2022-02-26 MED ORDER — PHENYLEPHRINE HCL-NACL 20-0.9 MG/250ML-% IV SOLN
INTRAVENOUS | Status: DC | PRN
  Administered 2022-02-26: 25 ug/min via INTRAVENOUS

## 2022-02-26 MED ORDER — BUPIVACAINE HCL (PF) 0.5 % IJ SOLN
INTRAMUSCULAR | Status: AC
  Filled 2022-02-26: qty 30

## 2022-02-26 MED ORDER — ALBUTEROL SULFATE (2.5 MG/3ML) 0.083% IN NEBU: 3.0000 mL | INHALATION_SOLUTION | RESPIRATORY_TRACT | Status: DC | PRN

## 2022-02-26 MED ORDER — CLOBETASOL PROPIONATE 0.05 % EX SOLN
1.0000 | Freq: Every day | CUTANEOUS | Status: DC
Start: 2022-02-26 — End: 2022-02-26

## 2022-02-26 MED ORDER — VITAMIN B-12 1000 MCG PO TABS: 1000.0000 ug | ORAL_TABLET | Freq: Every day | ORAL | Status: DC

## 2022-02-26 MED ORDER — SODIUM CHLORIDE 0.9 % IV SOLN: 250.0000 mL | INTRAVENOUS | Status: DC

## 2022-02-26 MED ORDER — FLUTICASONE PROPIONATE 50 MCG/ACT NA SUSP: 2.0000 | Freq: Every day | NASAL | Status: DC | PRN

## 2022-02-26 MED ORDER — OXYCODONE HCL 5 MG PO TABS
10.0000 mg | ORAL_TABLET | ORAL | Status: DC | PRN
  Administered 2022-02-26 – 2022-02-27 (×4): 10 mg via ORAL
  Filled 2022-02-26 (×4): qty 2

## 2022-02-26 MED ORDER — MIRABEGRON ER 50 MG PO TB24
50.0000 mg | ORAL_TABLET | Freq: Every day | ORAL | Status: DC
  Filled 2022-02-26: qty 1

## 2022-02-26 MED ORDER — LIDOCAINE 2% (20 MG/ML) 5 ML SYRINGE
INTRAMUSCULAR | Status: DC | PRN
  Administered 2022-02-26: 40 mg via INTRAVENOUS

## 2022-02-26 MED ORDER — CHLORHEXIDINE GLUCONATE 0.12 % MT SOLN
15.0000 mL | Freq: Once | OROMUCOSAL | Status: AC
  Administered 2022-02-26: 15 mL via OROMUCOSAL
  Filled 2022-02-26: qty 15

## 2022-02-26 MED ORDER — MENTHOL 3 MG MT LOZG: 1.0000 | LOZENGE | OROMUCOSAL | Status: DC | PRN

## 2022-02-26 MED ORDER — ROSUVASTATIN CALCIUM 5 MG PO TABS
10.0000 mg | ORAL_TABLET | Freq: Every day | ORAL | Status: DC
  Administered 2022-02-26: 10 mg via ORAL
  Filled 2022-02-26: qty 2

## 2022-02-26 MED ORDER — LACTATED RINGERS IV SOLN: INTRAVENOUS | Status: DC

## 2022-02-26 MED ORDER — CEFAZOLIN SODIUM-DEXTROSE 2-4 GM/100ML-% IV SOLN
2.0000 g | Freq: Three times a day (TID) | INTRAVENOUS | Status: AC
  Administered 2022-02-26 – 2022-02-27 (×2): 2 g via INTRAVENOUS
  Filled 2022-02-26 (×2): qty 100

## 2022-02-26 MED ORDER — CHLORHEXIDINE GLUCONATE 0.12 % MT SOLN: 15.0000 mL | Freq: Two times a day (BID) | OROMUCOSAL | Status: DC

## 2022-02-26 MED ORDER — LOPERAMIDE HCL 2 MG PO CAPS
2.0000 mg | ORAL_CAPSULE | Freq: Every day | ORAL | Status: DC | PRN
Start: 2022-02-26 — End: 2022-02-26

## 2022-02-26 SURGICAL SUPPLY — 84 items
ADH SKN CLS APL DERMABOND .7 (GAUZE/BANDAGES/DRESSINGS) ×4
APL SKNCLS STERI-STRIP NONHPOA (GAUZE/BANDAGES/DRESSINGS)
BAG COUNTER SPONGE SURGICOUNT (BAG) ×3 IMPLANT
BAG SPNG CNTER NS LX DISP (BAG) ×2
BASKET BONE COLLECTION (BASKET) ×3 IMPLANT
BENZOIN TINCTURE PRP APPL 2/3 (GAUZE/BANDAGES/DRESSINGS) IMPLANT
BIT DRILL TWIST 3X23 (BIT) IMPLANT
BLADE CLIPPER SURG (BLADE) IMPLANT
BLADE SURG 11 STRL SS (BLADE) ×3 IMPLANT
BLADE SURG 21 STRL SS (BLADE) IMPLANT
BUR MATCHSTICK NEURO 3.0 LAGG (BURR) ×3 IMPLANT
BUR PRECISION FLUTE 5.0 (BURR) ×3 IMPLANT
CANISTER SUCT 3000ML PPV (MISCELLANEOUS) ×3 IMPLANT
CARTRIDGE OIL MAESTRO DRILL (MISCELLANEOUS) ×3 IMPLANT
CNTNR URN SCR LID CUP LEK RST (MISCELLANEOUS) ×3 IMPLANT
CONT SPEC 4OZ STRL OR WHT (MISCELLANEOUS) ×2
COVER BACK TABLE 60X90IN (DRAPES) ×12 IMPLANT
DERMABOND ADVANCED (GAUZE/BANDAGES/DRESSINGS) ×4
DERMABOND ADVANCED .7 DNX12 (GAUZE/BANDAGES/DRESSINGS) ×3 IMPLANT
DIFFUSER DRILL AIR PNEUMATIC (MISCELLANEOUS) ×3 IMPLANT
DRAPE C-ARM 42X72 X-RAY (DRAPES) ×3 IMPLANT
DRAPE C-ARMOR (DRAPES) ×3 IMPLANT
DRAPE LAPAROTOMY 100X72X124 (DRAPES) ×3 IMPLANT
DRAPE SCAN PATIENT (DRAPES) ×3 IMPLANT
DRAPE SURG 17X23 STRL (DRAPES) ×3 IMPLANT
DRSG OPSITE POSTOP 4X8 (GAUZE/BANDAGES/DRESSINGS) IMPLANT
DURAPREP 26ML APPLICATOR (WOUND CARE) ×3 IMPLANT
ELECT REM PT RETURN 9FT ADLT (ELECTROSURGICAL) ×2
ELECTRODE REM PT RTRN 9FT ADLT (ELECTROSURGICAL) ×3 IMPLANT
GAUZE 4X4 16PLY ~~LOC~~+RFID DBL (SPONGE) IMPLANT
GAUZE SPONGE 4X4 12PLY STRL (GAUZE/BANDAGES/DRESSINGS) IMPLANT
GLOVE BIO SURGEON STRL SZ7.5 (GLOVE) IMPLANT
GLOVE BIOGEL PI IND STRL 7.0 (GLOVE) IMPLANT
GLOVE BIOGEL PI IND STRL 7.5 (GLOVE) ×6 IMPLANT
GLOVE BIOGEL PI INDICATOR 7.0 (GLOVE) ×8
GLOVE BIOGEL PI INDICATOR 7.5 (GLOVE) ×4
GLOVE ECLIPSE 7.0 STRL STRAW (GLOVE) ×6 IMPLANT
GLOVE EXAM NITRILE XL STR (GLOVE) IMPLANT
GLOVE SURG SS PI 6.5 STRL IVOR (GLOVE) IMPLANT
GOWN STRL REUS W/ TWL LRG LVL3 (GOWN DISPOSABLE) ×12 IMPLANT
GOWN STRL REUS W/ TWL XL LVL3 (GOWN DISPOSABLE) IMPLANT
GOWN STRL REUS W/TWL 2XL LVL3 (GOWN DISPOSABLE) IMPLANT
GOWN STRL REUS W/TWL LRG LVL3 (GOWN DISPOSABLE) ×10
GOWN STRL REUS W/TWL XL LVL3 (GOWN DISPOSABLE)
GRAFT BONE PROTEIOS XS 0.5CC (Orthopedic Implant) IMPLANT
HEMOSTAT POWDER KIT SURGIFOAM (HEMOSTASIS) ×3 IMPLANT
KIT BASIN OR (CUSTOM PROCEDURE TRAY) ×3 IMPLANT
KIT MAZOR X SCAN (KITS) IMPLANT
KIT POSITION SURG JACKSON T1 (MISCELLANEOUS) ×3 IMPLANT
KIT TURNOVER KIT B (KITS) ×3 IMPLANT
MARKER SPHERE PSV REFLC NDI (MISCELLANEOUS) ×15 IMPLANT
MILL BONE PREP (MISCELLANEOUS) ×3 IMPLANT
NDL HYPO 18GX1.5 BLUNT FILL (NEEDLE) IMPLANT
NDL SPNL 18GX3.5 QUINCKE PK (NEEDLE) IMPLANT
NEEDLE HYPO 18GX1.5 BLUNT FILL (NEEDLE) IMPLANT
NEEDLE HYPO 22GX1.5 SAFETY (NEEDLE) ×3 IMPLANT
NEEDLE SPNL 18GX3.5 QUINCKE PK (NEEDLE) IMPLANT
NS IRRIG 1000ML POUR BTL (IV SOLUTION) ×3 IMPLANT
OIL CARTRIDGE MAESTRO DRILL (MISCELLANEOUS) ×2
PACK LAMINECTOMY NEURO (CUSTOM PROCEDURE TRAY) ×3 IMPLANT
PAD ARMBOARD 7.5X6 YLW CONV (MISCELLANEOUS) ×9 IMPLANT
PUTTY GRAFTON DBF 9CC W/DELIVE (Putty) IMPLANT
ROD CVD SOLERA 5.5X90 (Rod) IMPLANT
ROD SOLERA 80MM (Rod) ×2 IMPLANT
ROD SOLERA 80X5.5XNS TI (Rod) IMPLANT
SCREW BS CNCMA S 8.5X80 T/C (Screw) IMPLANT
SCREW CANN SOLERA 8.5X35 F/5.5 (Screw) IMPLANT
SCREW SET SOLERA (Screw) ×16 IMPLANT
SCREW SET SOLERA TI5.5 (Screw) IMPLANT
SCREW SHANZ 4.0X80MM (EXFIX) IMPLANT
SCREW SOLERA 6.5X35MM (Screw) IMPLANT
SPIKE FLUID TRANSFER (MISCELLANEOUS) ×3 IMPLANT
SPONGE SURGIFOAM ABS GEL 100 (HEMOSTASIS) IMPLANT
SPONGE T-LAP 4X18 ~~LOC~~+RFID (SPONGE) IMPLANT
STRIP CLOSURE SKIN 1/2X4 (GAUZE/BANDAGES/DRESSINGS) IMPLANT
SUT VIC AB 0 CT1 18XCR BRD8 (SUTURE) ×3 IMPLANT
SUT VIC AB 0 CT1 8-18 (SUTURE) ×6
SUT VICRYL 3-0 RB1 18 ABS (SUTURE) ×3 IMPLANT
SYR 3ML LL SCALE MARK (SYRINGE) ×9 IMPLANT
TOWEL GREEN STERILE (TOWEL DISPOSABLE) ×3 IMPLANT
TOWEL GREEN STERILE FF (TOWEL DISPOSABLE) ×3 IMPLANT
TRAY FOLEY MTR SLVR 14FR STAT (SET/KITS/TRAYS/PACK) IMPLANT
TRAY FOLEY MTR SLVR 16FR STAT (SET/KITS/TRAYS/PACK) ×3 IMPLANT
WATER STERILE IRR 1000ML POUR (IV SOLUTION) ×3 IMPLANT

## 2022-02-26 NOTE — Anesthesia Postprocedure Evaluation (Signed)
Anesthesia Post Note  Patient: Michelle [name]  Procedure(s) Performed: REVISION SACRAL ONE SCREWS, EXTEND FUSION LUMABR FOUR-ILIUM WITH SACRAL TWO ALAR ILIAC SCREWS, POSTERIORLATERAL ARTHRODESIS LUMBAR FIVE-SACRAL ONE, LEFT LUMBAR FIVE-SACRAL ONE FORAMINOTOMY (Spine Lumbar) Application of O-Arm     Patient location during evaluation: PACU Anesthesia Type: General Level of consciousness: awake and alert Pain management: pain level controlled Vital Signs Assessment: post-procedure vital signs reviewed and stable Respiratory status: spontaneous breathing, nonlabored ventilation, respiratory function stable and patient connected to nasal cannula oxygen Cardiovascular status: blood pressure returned to baseline and stable Postop Assessment: no apparent nausea or vomiting Anesthetic complications: no   No notable events documented.  Last Vitals:  Vitals:   02/26/22 1343 02/26/22 1543  BP: 131/65 130/73  Pulse: 84 92  Resp: 18 16  Temp: 36.7 C (!) 36.4 C  SpO2: 99% 98%    Last Pain:  Vitals:   02/26/22 1343  TempSrc: Oral  PainSc:                  March Rummage Syon Tews

## 2022-02-26 NOTE — Anesthesia Procedure Notes (Signed)
Procedure Name: Intubation Date/Time: 02/26/2022 8:34 AM  Performed by: Gaylene Brooks, CRNAPre-anesthesia Checklist: Patient identified, Emergency Drugs available, Suction available and Patient being monitored Patient Re-evaluated:Patient Re-evaluated prior to induction Oxygen Delivery Method: Circle system utilized Preoxygenation: Pre-oxygenation with 100% oxygen Induction Type: IV induction Ventilation: Mask ventilation without difficulty Laryngoscope Size: Mac and 3 Grade View: Grade I Tube type: Oral Tube size: 7.0 mm Number of attempts: 1 Airway Equipment and Method: Stylet Placement Confirmation: ETT inserted through vocal cords under direct vision, positive ETCO2 and breath sounds checked- equal and bilateral Secured at: 21 cm Tube secured with: Tape Dental Injury: Teeth and Oropharynx as per pre-operative assessment

## 2022-02-26 NOTE — Transfer of Care (Signed)
Immediate Anesthesia Transfer of Care Note  Patient: Michelle [name]  Procedure(s) Performed: REVISION SACRAL ONE SCREWS, EXTEND FUSION LUMABR FOUR-ILIUM WITH SACRAL TWO ALAR ILIAC SCREWS, POSTERIORLATERAL ARTHRODESIS LUMBAR FIVE-SACRAL ONE, LEFT LUMBAR FIVE-SACRAL ONE FORAMINOTOMY (Spine Lumbar) Application of O-Arm  Patient Location: PACU  Anesthesia Type:General  Level of Consciousness: awake, oriented and drowsy  Airway & Oxygen Therapy: Patient Spontanous Breathing and Patient connected to face mask oxygen  Post-op Assessment: Report given to RN, Post -op Vital signs reviewed and stable and Patient moving all extremities X 4  Post vital signs: Reviewed and stable  Last Vitals:  Vitals Value Taken Time  BP 124/60 02/26/22 1245  Temp    Pulse 104 02/26/22 1248  Resp 14 02/26/22 1248  SpO2 95 % 02/26/22 1248  Vitals shown include unvalidated device data.  Last Pain:  Vitals:   02/26/22 0553  TempSrc:   PainSc: 8          Complications: No notable events documented.

## 2022-02-26 NOTE — Progress Notes (Signed)
Orthopedic Tech Progress Note Patient Details:  Michelle [name] [birth year] [id number]  Ortho Devices Type of Ortho Device: Lumbar corsett Ortho Device/Splint Location: BACK Ortho Device/Splint Interventions: Ordered, Adjustment   Post Interventions Patient Tolerated: Well Instructions Provided: [facility] [place] 02/26/2022, 2:21 PM

## 2022-02-26 NOTE — Op Note (Signed)
NEUROSURGERY OPERATIVE NOTE   PREOP DIAGNOSIS:  Pseudoarthrosis, L5-S1   POSTOP DIAGNOSIS: Same  PROCEDURE: Posterior segmental instrumentation, L4-Ilium including S2AI screws bilaterally - Medtronic Solera 8.5 x 39m x2, 8.5 x 35 x2, 6.5 x 35 x4 Removal of previous screw/rod construct Posterolateral arthrodesis, L5-S1 Use of bone allograft, BMP Use of intraoperative stereotactic navigation with robotic assistance  SURGEON: Dr. NConsuella Lose MD  ASSISTANT: Dr. TEmelda Brothers MD  ANESTHESIA: General Endotracheal  EBL: 200cc  SPECIMENS: None  DRAINS: None  COMPLICATIONS: None immediate  CONDITION: Hemodynamically stable to PACU  HISTORY: Michelle JARNAGINis a 71y.o. female with a previous history of L4-5 decompression and fusion followed by adjacent level decompression and fusion at L5-S1 2 years ago.  She did well for a time, but noted progressive onset of recurrent back and primarily left-sided leg pain.  Imaging revealed pseudoarthrosis at L5-S1 with lucency about the S1 screws.  She therefore elected to proceed with revision of hardware, extension of fusion to the ilium, and posterolateral arthrodesis.  The risks, benefits, and alternatives to surgery were all reviewed in detail with the patient.  After all questions were answered informed consent was obtained and witnessed.  PROCEDURE IN DETAIL: The patient was brought to the operating room. After induction of general anesthesia, the patient was positioned on the operative table in the prone position. All pressure points were meticulously padded.  The previous skin incision was then marked out and prepped and draped in the usual sterile fashion.  After timeout was conducted, the previous incision was infiltrated with local anesthetic with epinephrine.  The incision was then made sharply and Bovie electrocautery was used to dissect down through the subcutaneous tissue until the lumbodorsal fascia was identified and  incised.  Dissection was then carried slightly laterally to identify the previously placed screws bilaterally at L4, L5, and S1.  Further dissection was carried laterally in order to identify the lateral aspect of the L5 and S1 facet complex and the transverse processes bilaterally.  The setscrews were then removed with the assistance of a counter torque.  The previous rods were removed.  The S1 screws were noted to be very loose, and easily removed with a Coker.  The previous L4 and L5 screws appear to have good purchase and were removed in order to place new screws which will accommodate a 5.5 mm rod required for iliac screws.  A feeler was then used to check the screw holes bilaterally at L4, L5, and S1.  They all appear to be within bone.  New 6.5 x 35 mm screws were placed bilaterally at L4 and L5.  At this point the high-speed drill was used to decorticate the exposed lateral bone surfaces at the L5-S1 facet complex bilaterally as well as the transverse processes bilaterally in preparation for posterolateral arthrodesis.  4 x 4 sponges were then packed into the lateral gutters.  The posterior superior iliac spine was then identified and a Schanz pin was introduced.  This was connected to the [employer].  The field was then draped and intraoperative CT scan was taken for navigation.  Accuracy of the navigation system was checked and confirmed to be accurate.  The Mazor system was then used to plan out S2 alar iliac screws.  Utilizing the Mazor stereotactic robotic arm, pilot holes were drilled and tapped.  The pilot hole was then checked with a sound and found to have good bone circumferentially.  8.5 x 80 mm screws were then placed  bilaterally.  New 8.5 x 35 mm screws were then placed in the previous S1 pilot holes.  At this point AP, lateral, and oblique x-rays were taken confirming good location of the newly implanted hardware including teardrop views indicating good placement of the iliac screws,  without violation of the sciatic notch nor the hip.  At this point, review of the preoperative imaging revealed some lateral recess stenosis on the left at L5-S1 however there was a significant amount of epidural fibrosis essentially precluding further dissection without significant risk of nerve root injury.  I therefore elected not to attempt to dissect the ventral epidural space.  Bone allograft was mixed with BMP and packed into the decorticated posterior lateral gutters to facilitate posterolateral arthrodesis.  An 80 mm pre-bent lordotic rod was then placed on the right and a 90 mm pre-bent rod placed on the left.  Setscrews were placed and final tightened.  Hemostasis was then easily secured with bipolar electrocautery.  The wound was then closed in multiple layers using a combination of interrupted 0 and 3-0 Vicryl stitches.  Skin was closed with Dermabond.  The Schanz pin was removed and the stab incision closed with 3-0 Vicryl and Dermabond.  At the end of the case all sponge, needle, instrument, and cottonoid counts were correct.  Patient was then transferred to the stretcher, extubated, and taken to the postanesthesia care unit in stable hemodynamic condition.   Consuella Lose, MD [facility]

## 2022-02-26 NOTE — H&P (Signed)
Chief Complaint   Back and leg pain  History of Present Illness  Michelle [name] is a 71 y.o. female with a history of previous L4-5 decompression and fusion about 4 years ago, presenting back again with left-sided leg pain.  She underwent adjacent level decompression and extension of fusion L4-S1 about 2 years ago.  While she did well for short period of time, she began complaining again of similar back and primarily left-sided leg pain.  She attempted epidural steroid injections which she says actually exacerbated her pain.  CT myelogram revealed pseudoarthrosis at L5-S1 with halo around the S1 screws.  She therefore presents for revision of fusion with extension to the ilium.  Past Medical History   Past Medical History:  Diagnosis Date   Actinic keratosis    Allergic rhinitis 08/28/2006   Anemia    low iron   Anginal pain (HCC)    Asthma, chronic 03/14/2010   Does seem to use her albuterol excessively. Consider change to more manageable medicines and treat more like COPD in the future.   Carpal tunnel syndrome, left 05/29/2009   Last Assessment & Plan:  Advised to wear wrist brace at night when she is sleeping.  If this does not help will consider having her go to sports medicine.   Chronic frontal sinusitis 08/28/2016   Chronic left maxillary sinusitis 08/28/2016   Diverticulosis    Essential hypertension 10/30/2012   Gastroparesis 11/19/2011   DM and chronic narcotics contribute.  Causes dysphagia like symptoms    Generalized anxiety disorder 08/28/2006   GERD (gastroesophageal reflux disease)    Hearing loss 07/19/2015   Hearing aid in right ear   History of cataracts, bilateral    Hyperlipidemia 08/28/2006   Lumbar radiculopathy 04/07/2018   Lung nodules    Osteoporosis 08/08/2011   Pain in joint, pelvic region and thigh 11/18/2012   Radial styloid tenosynovitis 08/15/2013   Restless leg syndrome 05/14/2012   Seropositive rheumatoid arthritis 01/08/2010   Patient is  being followed by rheumatologist at [facility] patient is on Plaquenil and as well as prednisone.   Changed to methotrexate and prednisone in May of 2013  Patient does have a pain contract here with [facility] receives oxycodone 10/325 mg tabs every 6 hours. This has been titrated down from significant doses of methadone previously. Could attempt to tit   Shoulder joint pain 03/25/2011   The patient is compensating due to her not being able to use her left arm right now rotator cuff impingement if he continues to worsen would not be surprised if she does get a tear on this side as well   Squamous cell carcinoma in situ of skin 11/25/2014   Referred to dermatology for excision    Squamous cell carcinoma of skin 12/26/2014   Right distal pretibial. SCC-KA pattern.   Squamous cell carcinoma of skin 03/27/2015   Right inf. lat. knee. SCC-KA pattern.    Squamous cell carcinoma of skin 10/07/2016   Left lateral calf superior. WD SCC.   Squamous cell carcinoma of skin 10/07/2016   Left lateral calf inferior. WD SCC.   Squamous cell carcinoma of skin 04/28/2018   Left mid med. pretibial. WD SCC.   Squamous cell carcinoma of skin 08/12/2019   Left mid med. pretibial sup. KA type. EDC   Squamous cell carcinoma of skin 08/12/2019   Left mid med. pretibial inferior. WD. EDC.   Tear of medial meniscus of knee, left 04/27/2009  Type II diabetes mellitus 08/28/2006   Overview:  Patient has her ups and downs, go in line with flares of her RA and prednisone uses.  Patient does have hx of hypoglycemic events so would not try for perfect control but should have A1c goal around 7.0 Lab Results  Component Value Date   HGBA1C 6.9 12/27/2011   Last Assessment & Plan:  At goal. Will continue current metformin    Past Surgical History   Past Surgical History:  Procedure Laterality Date   ABDOMINAL HYSTERECTOMY     APPENDECTOMY     BIOPSY  05/10/2021   Procedure: BIOPSY;   Surgeon: Milus Banister, MD;  Location: [facility];  Service: Endoscopy;;  EGD and COLON   CARPAL TUNNEL RELEASE Right    CATARACT EXTRACTION Left    with lid lift   CHOLECYSTECTOMY     COLONOSCOPY     COLONOSCOPY WITH PROPOFOL N/A 05/10/2021   Procedure: COLONOSCOPY WITH PROPOFOL;  Surgeon: Milus Banister, MD;  Location: [facility];  Service: Endoscopy;  Laterality: N/A;   ESOPHAGOGASTRODUODENOSCOPY (EGD) WITH PROPOFOL N/A 05/10/2021   Procedure: ESOPHAGOGASTRODUODENOSCOPY (EGD) WITH PROPOFOL;  Surgeon: Milus Banister, MD;  Location: [facility];  Service: Endoscopy;  Laterality: N/A;   HERNIA REPAIR     KNEE SURGERY Right    x2- cartilage annd scar tissue   LEFT AND RIGHT HEART CATHETERIZATION WITH CORONARY ANGIOGRAM N/A 08/17/2013   Procedure: LEFT AND RIGHT HEART CATHETERIZATION WITH CORONARY ANGIOGRAM;  Surgeon: Laverda Page, MD;  Location: [facility] CATH LAB;  Service: Cardiovascular;  Laterality: N/A;   lumbar back surgery  04/04/2018   done by neurosurgeron   right foot bone spurs removed     ROTATOR CUFF REPAIR Right    SHOULDER SURGERY Left    x2- rotatar cuff tear   UPPER GASTROINTESTINAL ENDOSCOPY      Social History   Social History   Tobacco Use   Smoking status: Former    Packs/day: 1.50    Years: 5.00    Total pack years: 7.50    Types: Cigarettes    Quit date: 05/31/1994    Years since quitting: 27.7   Smokeless tobacco: Never  Vaping Use   Vaping Use: Never used  Substance Use Topics   Alcohol use: No    Alcohol/week: 0.0 standard drinks of alcohol   Drug use: No    Medications   Prior to Admission medications   Medication Sig Start Date End Date Taking? Authorizing Provider  acetaminophen (TYLENOL) 500 MG tablet Take 1,000 mg by mouth every 6 (six) hours as needed for moderate pain or headache.   Yes [provider]  Ascorbic Acid (VITAMIN C) 1000 MG tablet Take 1,000 mg by mouth daily.   Yes [provider]  betamethasone  dipropionate 0.05 % lotion Apply topically as directed. Apply to the scalp daily up to 5 days for scalp Patient taking differently: Apply 1 application  topically daily as needed (scalp irritation). Apply to the scalp daily up to 5 days for scalp 06/15/20  Yes Ralene Bathe, MD  clobetasol (TEMOVATE) 0.05 % external solution Apply 1 application  topically daily. To scalp up to 5 times per week 12/12/21  Yes Ralene Bathe, MD  cromolyn (OPTICROM) 4 % ophthalmic solution Place 1 drop into both eyes 3 (three) times daily as needed (itchy eyes). 05/05/20  Yes [provider]  cyclobenzaprine (FLEXERIL) 10 MG tablet Take 1 tablet by mouth 3 (three) times  daily as needed for muscle spasms. 05/07/21  Yes [provider]  fluticasone (FLONASE) 50 MCG/ACT nasal spray Place 2 sprays into both nostrils daily as needed for allergies. 12/21/21  Yes [name], MD  fluticasone furoate-vilanterol (BREO ELLIPTA) 100-25 MCG/ACT AEPB TAKE 1 PUFF BY MOUTH EVERY DAY Patient taking differently: Inhale 1 puff into the lungs daily. TAKE 1 PUFF BY MOUTH EVERY DAY 12/21/21  Yes [name], MD  gabapentin (NEURONTIN) 300 MG capsule TAKE 1 CAPSULE BY MOUTH EVERYDAY AT BEDTIME Strength: 300 mg Patient taking differently: Take 300 mg by mouth at bedtime. 08/06/21  Yes [name], MD  hydrOXYzine (ATARAX) 25 MG tablet Take 1-2 tablets qhs prn itch. May make drowsy Patient taking differently: Take 25-50 mg by mouth at bedtime as needed for itching. 12/12/21  Yes Ralene Bathe, MD  leflunomide (ARAVA) 10 MG tablet Take 10 mg by mouth daily.   Yes [provider]  losartan (COZAAR) 25 MG tablet TAKE 1/2 TABLET BY MOUTH ONCE DAILY Patient taking differently: Take 12.5 mg by mouth daily. 08/20/21  Yes [name], MD  metFORMIN (GLUCOPHAGE-XR) 500 MG 24 hr tablet Take 2 tablets (1,000 mg total) by mouth 2 (two) times daily. 11/07/21  Yes [name], MD  montelukast (SINGULAIR) 10 MG tablet TAKE 1  TABLET BY MOUTH EVERY DAY Patient taking differently: Take 10 mg by mouth at bedtime. 08/31/21  Yes [name], MD  mupirocin ointment (BACTROBAN) 2 % Apply 1 Application topically daily. Qd to open sores on arms, legs, toes until healed, then prn flares 01/24/22  Yes Ralene Bathe, MD  MYRBETRIQ 25 MG TB24 tablet Take 50 mg by mouth daily. 11/26/19  Yes [provider]  nitroGLYCERIN (NITROSTAT) 0.4 MG SL tablet Place 1 tablet (0.4 mg total) under the tongue every 5 (five) minutes as needed. 10/10/20  Yes Hensel, Jamal Collin, MD  omeprazole (PRILOSEC) 40 MG capsule Take 1 capsule (40 mg total) by mouth 2 (two) times daily. 12/17/21  Yes [name], MD  oxyCODONE-acetaminophen (PERCOCET) 10-325 MG tablet Take 1 tablet by mouth every 6 (six) hours as needed for pain. 03/16/22 04/15/22 Yes [name], MD  rosuvastatin (CRESTOR) 10 MG tablet Take 1 tablet (10 mg total) by mouth daily. 11/07/21  Yes [name], MD  sertraline (ZOLOFT) 50 MG tablet Take 1 tablet (50 mg total) by mouth daily. 08/20/21  Yes [name], MD  TRULICITY 3 HE/5.2DP SOPN INJECT 3MG UNDER THE SKIN ONCE A WEEK Patient taking differently: Inject 3 mg into the skin once a week. wednesday 01/08/22  Yes [name], MD  VENTOLIN HFA 108 (90 Base) MCG/ACT inhaler INHALE 2 PUFFS BY MOUTH EVERY 4 HOURS AS NEEDED FOR WHEEZING OR SHORTNESS OF BREATH 12/18/21  Yes [name], MD  vitamin B-12 (CYANOCOBALAMIN) 1000 MCG tablet Take 1,000 mcg by mouth daily.   Yes [provider]  Accu-Chek Softclix Lancets lancets Please use to check blood sugar up to two times per day. E11.42 09/06/21   Shary Key, DO  Blood Glucose Monitoring Suppl (ACCU-CHEK GUIDE) w/Device KIT Please use to check blood sugar up to two times per day. E11.42 09/06/21   Shary Key, DO  Blood Glucose Monitoring Suppl [facility] VERIO) w/Device KIT Check blood glucose once a day 03/10/20   [name], MD  chlorhexidine (PERIDEX) 0.12 %  solution Use as directed 15 mLs in the mouth or throat 2 (two) times daily. Patient taking differently: Use as directed 15 mLs  in the mouth or throat 2 (two) times daily as needed (mouth sores). 10/18/19   [name], MD  diclofenac Sodium (VOLTAREN) 1 % GEL Apply 2 g topically 2 (two) times daily as needed (pain). 04/18/21   [provider]  fluconazole (DIFLUCAN) 200 MG tablet Take 1 tablet (200 mg total) by mouth as directed. Patient taking differently: Take 200 mg by mouth daily as needed (yeast). 12/13/21   Ralene Bathe, MD  glucose blood (ACCU-CHEK GUIDE) test strip Use to check blood sugar 2x per day. E11.42 12/25/21   McDiarmid, Blane Ohara, MD  inFLIXimab (REMICADE) 100 MG injection Inject 100 mg into the vein every 7 (seven) weeks.     [provider]  loperamide (IMODIUM) 2 MG capsule Take 2 mg by mouth daily as needed for diarrhea or loose stools.    [provider]  mometasone (ELOCON) 0.1 % cream APPLY 1 APPLICATION TOPICALLY DAILY AS NEEDED (RASH). Patient taking differently: Apply 1 Application topically daily as needed (rash). 12/12/21   Ralene Bathe, MD  oxyCODONE-acetaminophen (PERCOCET) 10-325 MG tablet Take 1 tablet by mouth every 6 (six) hours as needed for pain. 04/15/22 05/15/22  [name], MD  oxyCODONE-acetaminophen (PERCOCET) 10-325 MG tablet Take 1 tablet by mouth every 8 (eight) hours as needed for pain. 05/16/22 06/15/22  [name], MD  primidone (MYSOLINE) 50 MG tablet 2 IN THE AM, 1 AT NOON, 1 IN THE EVENING. 02/11/22   Tat, Eustace Quail, DO    Allergies   Allergies  Allergen Reactions   Zofran [Ondansetron Hcl] Other (See Comments)    Prolong QT   Ace Inhibitors Cough    Occurred with lisinopril   Levofloxacin Other (See Comments)    Felt things on her legs that were not there,made her stomach hurt   Lisinopril Cough   Sulfonamide Derivatives Hives and Itching    Review of Systems  ROS  Neurologic Exam  Awake, alert,  oriented Memory and concentration grossly intact Speech fluent, appropriate CN grossly intact Motor exam: Upper Extremities Deltoid Bicep Tricep Grip  Right 5/5 5/5 5/5 5/5  Left 5/5 5/5 5/5 5/5   Lower Extremities IP Quad PF DF EHL  Right 5/5 5/5 5/5 5/5 5/5  Left 5/5 5/5 5/5 5/5 5/5   Sensation grossly intact to LT  Imaging  CT myelogram dated 12/31/2021 was personally reviewed and demonstrates previous instrumentation L4-S1.  There appears to be good interbody arthrodesis at L4-5.  There does appear to be significant lucency around the S1 screws bilaterally without evidence of interbody arthrodesis at 5 1.  There does also appear to be primarily left-sided osteophytosis and resultant bony foraminal stenosis likely affecting the left L5 nerve root.  Impression  - 71 y.o. female status post L4-S1 fusion with recurrent back and left-sided leg pain likely related to pseudoarthrosis at L5-S1 and continued foraminal stenosis on the left.  Plan  -We will plan on proceeding with extension of fusion to the pelvis and redo left L5-S1 foraminotomy  I have reviewed the details of the procedure at length with the patient and her daughter in the office.  We have discussed the expected postoperative course and recovery.  We have also reviewed the associated risks, benefits, and alternatives to surgery.  We have discussed the goals of the operation and the likelihood of achieving these goals.  All her questions today were answered and she provided informed consent to proceed.   Consuella Lose, MD [facility]

## 2022-02-27 LAB — GLUCOSE, CAPILLARY: Glucose-Capillary: 117 mg/dL — ABNORMAL HIGH (ref 70–99)

## 2022-02-27 MED ORDER — ALBUTEROL SULFATE HFA 108 (90 BASE) MCG/ACT IN AERS
2.0000 | INHALATION_SPRAY | RESPIRATORY_TRACT | Status: DC | PRN
Start: 2022-02-27 — End: 2022-02-27

## 2022-02-27 NOTE — Evaluation (Addendum)
Physical Therapy Evaluation  Patient Details Name: Michelle [name] MRN: [id number] DOB: [birth year] Today's Date: 02/27/2022  History of Present Illness  Pt is a 71 y/o female who presents s/p revision of sacral 1 screws, extend fusion L4-ilium with Sacral 2 alar iliac screws, posteriorlateral arthrodesis L5-S1, L lumbar 5-Sacral foraminotomy. PMH significant for spinal surgery, HTN, osteoporosis, OA, and DMII.   Clinical Impression  Pt admitted with above diagnosis. At the time of PT eval, pt was able to demonstrate transfers and ambulation with gross min guard assist to supervision for safety and RW for support. Pt was educated on precautions, brace application/wearing schedule, appropriate activity progression, and car transfer. Pt currently with functional limitations due to the deficits listed below (see PT Problem List). Pt will benefit from skilled PT to increase their independence and safety with mobility to allow discharge to the venue listed below.         Recommendations for follow up therapy are one component of a multi-disciplinary discharge planning process, led by the attending physician.  Recommendations may be updated based on patient status, additional functional criteria and insurance authorization.  Follow Up Recommendations No PT follow up      Assistance Recommended at Discharge PRN  Patient can return home with the following  A little help with walking and/or transfers;A little help with bathing/dressing/bathroom;Assistance with cooking/housework;Assist for transportation;Help with stairs or ramp for entrance    Equipment Recommendations None recommended by PT  Recommendations for Other Services       Functional Status Assessment Patient has had a recent decline in their functional status and demonstrates the ability to make significant improvements in function in a reasonable and predictable amount of time.     Precautions / Restrictions Precautions Precautions:  Back Precaution Booklet Issued: Yes (comment) Precaution Comments: Reviewed all education Required Braces or Orthoses: Spinal Brace Spinal Brace: Lumbar corset;Applied in sitting position Restrictions Weight Bearing Restrictions: No      Mobility  Bed Mobility               General bed mobility comments: Verbally reviewed log roll technique.    Transfers Overall transfer level: Needs assistance Equipment used: Rolling walker (2 wheels) Transfers: Sit to/from Stand Sit to Stand: Supervision           General transfer comment: VC's for hand placement on seated surface for safety. No assist to power up to full stand but close supervision provided for safety.    Ambulation/Gait Ambulation/Gait assistance: Min guard, Supervision Gait Distance (Feet): 200 Feet Assistive device: Rolling walker (2 wheels) Gait Pattern/deviations: Step-through pattern, Decreased stride length, Trunk flexed, Narrow base of support Gait velocity: Decreased Gait velocity interpretation: 1.31 - 2.62 ft/sec, indicative of limited community ambulator   General Gait Details: Low floor clearance and short step/stride length. No overt LOB noted but close supervision provided for safety.  Stairs Stairs: Yes Stairs assistance: Min guard Stair Management: One rail Left, Step to pattern, Forwards Number of Stairs: 5 General stair comments: VC's for sequencing and general safety. Close guard for safety.  Wheelchair Mobility    Modified Rankin (Stroke Patients Only)       Balance Overall balance assessment: Mild deficits observed, not formally tested                                           Pertinent Vitals/Pain Pain Assessment Pain  Assessment: Faces Faces Pain Scale: Hurts little more Pain Location: operative site Pain Descriptors / Indicators: Aching, Constant, Operative site guarding Pain Intervention(s): Limited activity within patient's tolerance, Monitored during  session, Repositioned    Home Living Family/patient expects to be discharged to:: Private residence Living Arrangements: Spouse/significant other;Other relatives (granddaughter) Available Help at Discharge: Family;Available 24 hours/day Type of Home: Mobile home Home Access: Stairs to enter Entrance Stairs-Rails: Left Entrance Stairs-Number of Steps: 3-4 concrete stairs   Home Layout: One level Home Equipment: Conservation officer, nature (2 wheels);Rollator (4 wheels);Cane - single point;BSC/3in1 Additional Comments: Was not using RW prior to surgery per patient report, but reports she will feel better using it for a while after surgery    Prior Function Prior Level of Function : Needs assist             Mobility Comments: no AD ADLs Comments: Granddaughter assisted with shower transfer     Hand Dominance   Dominant Hand: Right    Extremity/Trunk Assessment   Upper Extremity Assessment Upper Extremity Assessment: Generalized weakness    Lower Extremity Assessment Lower Extremity Assessment: Generalized weakness (Consistent with pre-op diagnosis)    Cervical / Trunk Assessment Cervical / Trunk Assessment: Back Surgery  Communication   Communication: No difficulties  Cognition Arousal/Alertness: Awake/alert Behavior During Therapy: WFL for tasks assessed/performed Overall Cognitive Status: Within Functional Limits for tasks assessed                                          General Comments General comments (skin integrity, edema, etc.): Daughter present    Exercises     Assessment/Plan    PT Assessment Patient needs continued PT services  PT Problem List Decreased strength;Decreased activity tolerance;Decreased balance;Decreased mobility;Decreased knowledge of use of DME;Decreased safety awareness;Decreased knowledge of precautions;Pain       PT Treatment Interventions DME instruction;Gait training;Stair training;Functional mobility  training;Therapeutic activities;Therapeutic exercise;Neuromuscular re-education;Patient/family education    PT Goals (Current goals can be found in the Care Plan section)  Acute Rehab PT Goals Patient Stated Goal: Home today PT Goal Formulation: With patient Time For Goal Achievement: 03/06/22 Potential to Achieve Goals: Good    Frequency Min 5X/week     Co-evaluation               AM-PAC PT "6 Clicks" Mobility  Outcome Measure Help needed turning from your back to your side while in a flat bed without using bedrails?: A Little Help needed moving from lying on your back to sitting on the side of a flat bed without using bedrails?: A Little Help needed moving to and from a bed to a chair (including a wheelchair)?: A Little Help needed standing up from a chair using your arms (e.g., wheelchair or bedside chair)?: A Little Help needed to walk in hospital room?: A Little Help needed climbing 3-5 steps with a railing? : A Little 6 Click Score: 18    End of Session Equipment Utilized During Treatment: Gait belt;Back brace Activity Tolerance: Patient tolerated treatment well Patient left: in chair;with call bell/phone within reach;with family/visitor present Nurse Communication: Mobility status PT Visit Diagnosis: Unsteadiness on feet (R26.81);Pain Pain - part of body:  (back)    Time: 8250-5397 PT Time Calculation (min) (ACUTE ONLY): 15 min   Charges:   PT Evaluation $PT Eval Low Complexity: 1 Low  Rolinda Roan, PT, DPT Acute Rehabilitation Services Secure Chat Preferred Office: [phone]   Thelma Comp 02/27/2022, 10:46 AM

## 2022-02-27 NOTE — Discharge Summary (Signed)
Physician Discharge Summary  Patient ID: Michelle [name] MRN: [id number] DOB/AGE: 03-24-1951 71 y.o.  Admit date: 02/26/2022 Discharge date: 02/27/2022  Admission Diagnoses:  Pseudoarthrosis of L5-S1  Discharge Diagnoses:  Same Principal Problem:   Pseudoarthrosis of lumbar spine   Discharged Condition: Stable  Hospital Course:  Michelle [name] is a 71 y.o. female admitted after hardware revision and extension of fusion to the pelvis. She was at baseline postop with significant improvement in preop back pain and leg paresthesias. She was ambulating well, tolerating diet, voiding normally and requested d/c.  Treatments: Surgery - Extension of instrumentation L4-Pelvis, L5-S1 posterolateral arthrodesis  Discharge Exam: Blood pressure 102/68, pulse 100, temperature 98.4 F (36.9 C), temperature source Oral, resp. rate 18, height 5' 2"  (1.575 m), weight 59.9 kg, SpO2 94 %. Awake, alert, oriented Speech fluent, appropriate CN grossly intact 5/5 BUE/BLE Wound c/d/i  Disposition: Discharge disposition: 01-Home or Self Care       Discharge Instructions     Call MD for:  redness, tenderness, or signs of infection (pain, swelling, redness, odor or green/yellow discharge around incision site)   Complete by: As directed    Call MD for:  temperature >100.4   Complete by: As directed    Diet - low sodium heart healthy   Complete by: As directed    Discharge instructions   Complete by: As directed    Walk at home as much as possible, at least 4 times / day   Incentive spirometry RT   Complete by: As directed    Increase activity slowly   Complete by: As directed    Lifting restrictions   Complete by: As directed    No lifting > 10 lbs   May shower / Bathe   Complete by: As directed    48 hours after surgery   May walk up steps   Complete by: As directed    Other Restrictions   Complete by: As directed    No bending/twisting at waist   Remove dressing in 24 hours    Complete by: As directed       Allergies as of 02/27/2022       Reactions   Zofran [ondansetron Hcl] Other (See Comments)   Prolong QT   Ace Inhibitors Cough   Occurred with lisinopril   Levofloxacin Other (See Comments)   Felt things on her legs that were not there,made her stomach hurt   Lisinopril Cough   Sulfonamide Derivatives Hives, Itching        Medication List     STOP taking these medications    diclofenac Sodium 1 % Gel Commonly known as: VOLTAREN       TAKE these medications    Accu-Chek Guide test strip Generic drug: glucose blood Use to check blood sugar 2x per day. E11.42   Accu-Chek Softclix Lancets lancets Please use to check blood sugar up to two times per day. E11.42   acetaminophen 500 MG tablet Commonly known as: TYLENOL Take 1,000 mg by mouth every 6 (six) hours as needed for moderate pain or headache.   betamethasone dipropionate 0.05 % lotion Apply topically as directed. Apply to the scalp daily up to 5 days for scalp What changed:  how much to take when to take this reasons to take this   chlorhexidine 0.12 % solution Commonly known as: PERIDEX Use as directed 15 mLs in the mouth or throat 2 (two) times daily. What changed:  when to take this reasons to  take this   clobetasol 0.05 % external solution Commonly known as: TEMOVATE Apply 1 application  topically daily. To scalp up to 5 times per week   cromolyn 4 % ophthalmic solution Commonly known as: OPTICROM Place 1 drop into both eyes 3 (three) times daily as needed (itchy eyes).   cyanocobalamin 1000 MCG tablet Commonly known as: VITAMIN B12 Take 1,000 mcg by mouth daily.   cyclobenzaprine 10 MG tablet Commonly known as: FLEXERIL Take 1 tablet by mouth 3 (three) times daily as needed for muscle spasms.   fluconazole 200 MG tablet Commonly known as: DIFLUCAN Take 1 tablet (200 mg total) by mouth as directed. What changed:  when to take this reasons to take this    fluticasone 50 MCG/ACT nasal spray Commonly known as: FLONASE Place 2 sprays into both nostrils daily as needed for allergies.   fluticasone furoate-vilanterol 100-25 MCG/ACT Aepb Commonly known as: Breo Ellipta TAKE 1 PUFF BY MOUTH EVERY DAY What changed:  how much to take how to take this when to take this   gabapentin 300 MG capsule Commonly known as: NEURONTIN TAKE 1 CAPSULE BY MOUTH EVERYDAY AT BEDTIME Strength: 300 mg What changed:  how much to take how to take this when to take this additional instructions   hydrOXYzine 25 MG tablet Commonly known as: ATARAX Take 1-2 tablets qhs prn itch. May make drowsy What changed:  how much to take how to take this when to take this reasons to take this additional instructions   inFLIXimab 100 MG injection Commonly known as: REMICADE Inject 100 mg into the vein every 7 (seven) weeks.   leflunomide 10 MG tablet Commonly known as: ARAVA Take 10 mg by mouth daily.   loperamide 2 MG capsule Commonly known as: IMODIUM Take 2 mg by mouth daily as needed for diarrhea or loose stools.   losartan 25 MG tablet Commonly known as: COZAAR TAKE 1/2 TABLET BY MOUTH ONCE DAILY What changed:  how much to take how to take this when to take this additional instructions   metFORMIN 500 MG 24 hr tablet Commonly known as: GLUCOPHAGE-XR Take 2 tablets (1,000 mg total) by mouth 2 (two) times daily.   mometasone 0.1 % cream Commonly known as: ELOCON APPLY 1 APPLICATION TOPICALLY DAILY AS NEEDED (RASH). What changed:  how much to take how to take this when to take this reasons to take this additional instructions   montelukast 10 MG tablet Commonly known as: SINGULAIR TAKE 1 TABLET BY MOUTH EVERY DAY What changed: when to take this   mupirocin ointment 2 % Commonly known as: BACTROBAN Apply 1 Application topically daily. Qd to open sores on arms, legs, toes until healed, then prn flares   Myrbetriq 25 MG Tb24  tablet Generic drug: mirabegron ER Take 50 mg by mouth daily.   nitroGLYCERIN 0.4 MG SL tablet Commonly known as: NITROSTAT Place 1 tablet (0.4 mg total) under the tongue every 5 (five) minutes as needed.   omeprazole 40 MG capsule Commonly known as: PRILOSEC Take 1 capsule (40 mg total) by mouth 2 (two) times daily.   OneTouch Verio w/Device Kit Check blood glucose once a day   Accu-Chek Guide w/Device Kit Please use to check blood sugar up to two times per day. E11.42   oxyCODONE-acetaminophen 10-325 MG tablet Commonly known as: PERCOCET Take 1 tablet by mouth every 6 (six) hours as needed for pain. Start taking on: March 16, 2022   oxyCODONE-acetaminophen 10-325 MG tablet Commonly known  as: PERCOCET Take 1 tablet by mouth every 6 (six) hours as needed for pain. Start taking on: April 15, 2022   oxyCODONE-acetaminophen 10-325 MG tablet Commonly known as: PERCOCET Take 1 tablet by mouth every 8 (eight) hours as needed for pain. Start taking on: May 16, 2022   primidone 50 MG tablet Commonly known as: MYSOLINE 2 IN THE AM, 1 AT NOON, 1 IN THE EVENING.   rosuvastatin 10 MG tablet Commonly known as: CRESTOR Take 1 tablet (10 mg total) by mouth daily.   sertraline 50 MG tablet Commonly known as: ZOLOFT Take 1 tablet (50 mg total) by mouth daily.   Trulicity 3 OD/2.5HQ Sopn Generic drug: Dulaglutide INJECT 3MG UNDER THE SKIN ONCE A WEEK What changed: See the new instructions.   Ventolin HFA 108 (90 Base) MCG/ACT inhaler Generic drug: albuterol INHALE 2 PUFFS BY MOUTH EVERY 4 HOURS AS NEEDED FOR WHEEZING OR SHORTNESS OF BREATH   vitamin C 1000 MG tablet Take 1,000 mg by mouth daily.        Follow-up Information     Consuella Lose, MD Follow up in 3 week(s).   Specialty: Neurosurgery Contact information: [address] [place] [address] [phone]                 Signed: Jairo Ben 02/27/2022, 7:49  AM

## 2022-02-27 NOTE — Plan of Care (Signed)
Pt doing well. Pt and daughter given D/C instructions with verbal understanding. Pt's incision is clean and dry with no sign of infection. Pt's IV was removed prior to D/C. Pt D/C'd home via wheelchair per MD order. Pt is stable @ D/C and has no other needs at this time. Jory Welke, RN  

## 2022-02-27 NOTE — Evaluation (Signed)
Occupational Therapy Evaluation Patient Details Name: Michelle [name] MRN: [id number] DOB: 08/04/1950 Today's Date: 02/27/2022   History of Present Illness Pt is a 71 y.o. female s/p revision of sacral 1 screws, extend fusion L4-ilium with Sacral 2 alar iliac screws, posteriorlateral arthrodesis L5-S1, L lumbar 5-Sacral foraminotomy. PMH significant for spinal surgery, HTN, osteoporosis, seropositive osteoarthritis, and DMII.   Clinical Impression   PTA, pt was independent in ADL, light IADL, and driving. Upon eval, pt performing UB and LB ADL with supervision for safety and occasional reminders of adherance to spinal precautions. Pt educated and demonstrating use of compensatory techniques for LB dressing, brace application, oral care, tub transfer, and bed mobility within precautions. Pt daughter, husband, and grand daughter available to help as needed upon return home. Recommend discharge home with no OT follow up. OT to sign off. Re-consult if change in status.      Recommendations for follow up therapy are one component of a multi-disciplinary discharge planning process, led by the attending physician.  Recommendations may be updated based on patient status, additional functional criteria and insurance authorization.   Follow Up Recommendations  No OT follow up    Assistance Recommended at Discharge Intermittent Supervision/Assistance  Patient can return home with the following A little help with walking and/or transfers;A little help with bathing/dressing/bathroom;Assistance with cooking/housework;Assist for transportation;Help with stairs or ramp for entrance    Functional Status Assessment  Patient has had a recent decline in their functional status and demonstrates the ability to make significant improvements in function in a reasonable and predictable amount of time.  Equipment Recommendations  None recommended by OT (Pt has all recommended DME)    Recommendations for Other  Services       Precautions / Restrictions Precautions Precautions: Back Precaution Booklet Issued: Yes (comment) Precaution Comments: Reviewed all education Required Braces or Orthoses: Spinal Brace Spinal Brace: Lumbar corset Restrictions Weight Bearing Restrictions: No      Mobility Bed Mobility               General bed mobility comments: Pt verbalizing log roll technique taught by nursing staff prior to OT arrival    Transfers Overall transfer level: Needs assistance Equipment used: Rolling walker (2 wheels) Transfers: Sit to/from Stand Sit to Stand: Supervision           General transfer comment: Supervision for safety. Initially 1-2 cues for hand placement      Balance Overall balance assessment: Mild deficits observed, not formally tested                                         ADL either performed or assessed with clinical judgement   ADL Overall ADL's : Needs assistance/impaired Eating/Feeding: Modified independent   Grooming: Supervision/safety;Standing   Upper Body Bathing: Set up;Sitting   Lower Body Bathing: Supervison/ safety;Sit to/from stand   Upper Body Dressing : Set up;Sitting   Lower Body Dressing: Supervision/safety;Sit to/from stand Lower Body Dressing Details (indicate cue type and reason): educated and demonstrating use of compensatory techniques Toilet Transfer: Supervision/safety;Rolling walker (2 wheels);Ambulation Toilet Transfer Details (indicate cue type and reason): Perfroming during session Toileting- Clothing Manipulation and Hygiene: Supervision/safety;Sit to/from stand Toileting - Clothing Manipulation Details (indicate cue type and reason): Performing during session with supervision for safety and adherance to precautions Tub/ Shower Transfer: Tub transfer;Min guard;Rolling walker (2 wheels);BSC/3in1 Tub/Shower Transfer Details (indicate cue  type and reason): Pt educated and demonstrating with min  guard A during session. Min cues for new learning Functional mobility during ADLs: Supervision/safety;Rolling walker (2 wheels) General ADL Comments: All education provided.     Vision Patient Visual Report: No change from baseline Vision Assessment?: No apparent visual deficits Additional Comments: Pt scanning to locate items for ADL     Perception     Praxis      Pertinent Vitals/Pain Pain Assessment Pain Assessment: Faces Faces Pain Scale: Hurts little more Pain Location: operative site Pain Descriptors / Indicators: Aching, Constant, Operative site guarding Pain Intervention(s): Limited activity within patient's tolerance     Hand Dominance Right   Extremity/Trunk Assessment Upper Extremity Assessment Upper Extremity Assessment: Overall WFL for tasks assessed   Lower Extremity Assessment Lower Extremity Assessment: Defer to PT evaluation   Cervical / Trunk Assessment Cervical / Trunk Assessment: Back Surgery   Communication Communication Communication: No difficulties   Cognition Arousal/Alertness: Awake/alert Behavior During Therapy: WFL for tasks assessed/performed Overall Cognitive Status: Within Functional Limits for tasks assessed                                 General Comments: Pt unaware of precautions on OT arrival, but verbalizing understanding following initial education. Pt requiring min cues for adherance throughout. Daughter present and reinforcing     General Comments  Daughter present    Exercises     Shoulder Instructions      Home Living Family/patient expects to be discharged to:: Private residence Living Arrangements: Spouse/significant other;Other relatives (grand daughter) Available Help at Discharge: Family;Available 24 hours/day Type of Home: Mobile home Home Access: Stairs to enter Entrance Stairs-Number of Steps: 3-4 concrete stairs Entrance Stairs-Rails: Left Home Layout: One level     Bathroom Shower/Tub:  Teacher, early years/pre: Standard     Home Equipment: Conservation officer, nature (2 wheels);Rollator (4 wheels);Cane - single point;BSC/3in1   Additional Comments: Was not using RW prior to surgery per patient report, but reports she will feel better using it for a while after surgery      Prior Functioning/Environment Prior Level of Function : Needs assist             Mobility Comments: no AD ADLs Comments: Grand daughter assisted with shower transfer        OT Problem List: Decreased strength;Decreased activity tolerance;Impaired balance (sitting and/or standing);Decreased knowledge of precautions;Decreased knowledge of use of DME or AE;Pain      OT Treatment/Interventions:      OT Goals(Current goals can be found in the care plan section) Acute Rehab OT Goals Patient Stated Goal: Go home OT Goal Formulation: With patient/family  OT Frequency:      Co-evaluation              AM-PAC OT "6 Clicks" Daily Activity     Outcome Measure Help from another person eating meals?: None Help from another person taking care of personal grooming?: A Little Help from another person toileting, which includes using toliet, bedpan, or urinal?: A Little Help from another person bathing (including washing, rinsing, drying)?: A Little Help from another person to put on and taking off regular upper body clothing?: A Little Help from another person to put on and taking off regular lower body clothing?: A Little 6 Click Score: 19   End of Session Equipment Utilized During Treatment: Gait belt;Rolling walker (2 wheels) Nurse Communication:  Mobility status  Activity Tolerance: Patient tolerated treatment well Patient left: in bed;with call bell/phone within reach;with family/visitor present  OT Visit Diagnosis: Unsteadiness on feet (R26.81);Muscle weakness (generalized) (M62.81);Pain Pain - part of body:  (operative site)                Time: 0802-0824 OT Time Calculation (min): 22  min Charges:  OT General Charges $OT Visit: 1 Visit OT Evaluation $OT Eval Low Complexity: 1 Low  Michelle [name], OTR/L [facility] Acute Rehabilitation Office: [phone]   Michelle [name] 02/27/2022, 9:59 AM

## 2022-03-01 ENCOUNTER — Ambulatory Visit: Payer: Medicare Other | Admitting: Podiatry

## 2022-03-16 ENCOUNTER — Other Ambulatory Visit: Payer: Self-pay | Admitting: Family Medicine

## 2022-03-18 ENCOUNTER — Telehealth: Payer: Self-pay | Admitting: *Deleted

## 2022-03-18 ENCOUNTER — Other Ambulatory Visit: Payer: Self-pay | Admitting: Dermatology

## 2022-03-18 DIAGNOSIS — L409 Psoriasis, unspecified: Secondary | ICD-10-CM

## 2022-03-18 NOTE — Telephone Encounter (Signed)
Patient called and states that her oxycodone scripts for October and November are only written for 30 tablets each and she gets 120 pills/month.  Will forward to MD to address.  Hoang Pettingill,CMA

## 2022-03-19 MED ORDER — OXYCODONE-ACETAMINOPHEN 10-325 MG PO TABS: 1.0000 | ORAL_TABLET | Freq: Four times a day (QID) | ORAL | 0 refills | Status: DC | PRN

## 2022-03-19 NOTE — Telephone Encounter (Signed)
Prescriptions have been corrected

## 2022-03-20 ENCOUNTER — Other Ambulatory Visit: Payer: Self-pay

## 2022-03-20 MED ORDER — ROSUVASTATIN CALCIUM 10 MG PO TABS: 10.0000 mg | ORAL_TABLET | Freq: Every day | ORAL | 3 refills | Status: DC

## 2022-03-22 ENCOUNTER — Encounter: Payer: Self-pay | Admitting: Podiatry

## 2022-03-22 ENCOUNTER — Ambulatory Visit (INDEPENDENT_AMBULATORY_CARE_PROVIDER_SITE_OTHER): Payer: Medicare Other | Admitting: Podiatry

## 2022-03-22 DIAGNOSIS — M79675 Pain in left toe(s): Secondary | ICD-10-CM

## 2022-03-22 DIAGNOSIS — E1151 Type 2 diabetes mellitus with diabetic peripheral angiopathy without gangrene: Secondary | ICD-10-CM | POA: Diagnosis not present

## 2022-03-22 DIAGNOSIS — B351 Tinea unguium: Secondary | ICD-10-CM

## 2022-03-22 DIAGNOSIS — M79674 Pain in right toe(s): Secondary | ICD-10-CM | POA: Diagnosis not present

## 2022-03-29 NOTE — Progress Notes (Signed)
  Subjective:  Patient ID: Michelle [name], female    DOB: 24-Sep-1950,  MRN: [id number]  Michelle [name] presents to clinic today for at risk foot care. Pt has h/o NIDDM with PAD and painful elongated mycotic toenails 1-5 bilaterally which are tender when wearing enclosed shoe gear. Pain is relieved with periodic professional debridement.  Last known  HgA1c was 6.6%.  Patient does not monitor blood glucose daily.  Patient states she is recovering from back surgery.  New problem(s): None.   PCP is [name], MD , and last visit was  February 19, 2022.  Allergies  Allergen Reactions   Zofran [Ondansetron Hcl] Other (See Comments)    Prolong QT   Ace Inhibitors Cough    Occurred with lisinopril   Levofloxacin Other (See Comments)    Felt things on her legs that were not there,made her stomach hurt   Lisinopril Cough   Sulfonamide Derivatives Hives and Itching   Sulfa Antibiotics     Reaction:  Other reaction(s): ITCHING    Review of Systems: Negative except as noted in the HPI.  Objective: No changes noted in today's physical examination.  Michelle [name] is a pleasant 71 y.o. female WD, WN in NAD. AAO x 3. Vascular CFT < 3 seconds b/l. Palpable DP pulse(s) right lower extremity Palpable PT pulse(s) right lower extremity Diminished DP pulse(s) left lower extremity. Diminished PT pulse(s) left lower extremity. Pedal hair absent. No pain with calf compression b/l. No cyanosis or clubbing noted.  Neurologic Normal speech. Oriented to person, place, and time. Protective sensation intact 5/5 intact bilaterally with 10g monofilament b/l. Vibratory sensation intact b/l.  Dermatologic Pedal integument with normal turgor, texture and tone b/l LE. No open wounds b/l. No interdigital macerations b/l. Toenails 1-5 b/l elongated, thickened, discolored with subungual debris. +Tenderness with dorsal palpation of nailplates. No hyperkeratotic nor porokeratotic lesions present on today's visit.    Orthopedic: Muscle strength 5/5 to all lower extremity muscle groups bilaterally. HAV with bunion deformity noted b/l LE. Hammertoe(s) noted to the L 3rd toe.   Radiographs: None  Assessment/Plan: 1. Pain due to onychomycosis of toenails of both feet   2. Diabetes mellitus with peripheral circulatory disorder (HCC)     No orders of the defined types were placed in this encounter. -Consent given for treatment as described below: -Examined patient. -Patient to continue soft, supportive shoe gear daily. -Toenails 1-5 b/l were debrided in length and girth with sterile nail nippers and dremel without iatrogenic bleeding.  -Patient/POA to call should there be question/concern in the interim.   Return in about 3 months (around 06/21/2022).  Marzetta Board, DPM

## 2022-04-01 ENCOUNTER — Other Ambulatory Visit: Payer: Self-pay

## 2022-04-01 DIAGNOSIS — E119 Type 2 diabetes mellitus without complications: Secondary | ICD-10-CM

## 2022-04-01 MED ORDER — ACCU-CHEK GUIDE VI STRP: ORAL_STRIP | 4 refills | Status: DC

## 2022-04-10 NOTE — Telephone Encounter (Signed)
Note not needed 

## 2022-04-18 IMAGING — MR MR LUMBAR SPINE WO/W CM
4 of 7 series · 24 of 48 positions shown · IV contrast (13ml multihance)
Comparison: 03/05/2020

CLINICAL DATA: Low back pain radiating down both hips and legs,
prior surgery

EXAM:
MRI LUMBAR SPINE WITHOUT AND WITH CONTRAST
TECHNIQUE: Multiplanar and multiecho pulse sequences of the lumbar spine were
obtained without and with intravenous contrast.
CONTRAST:  13mL MULTIHANCE GADOBENATE DIMEGLUMINE 529 MG/ML IV SOLN

[Series 3: T1 · sagittal · 4.0mm · 0.88mm/px · 4 of 21 slices shown (1 of 2)]
[im 1/21]
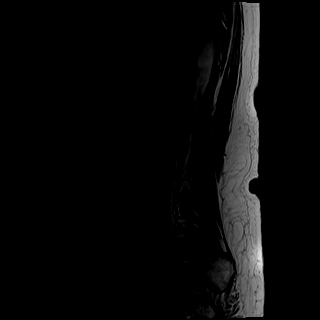
[im 7/21]
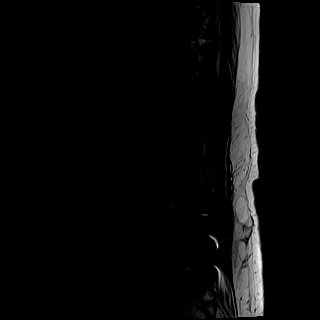
[im 14/21]
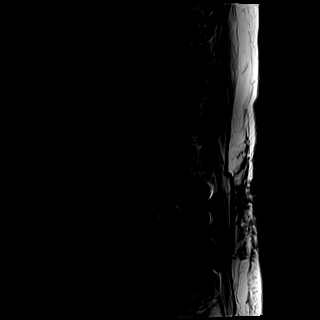
[im 21/21]
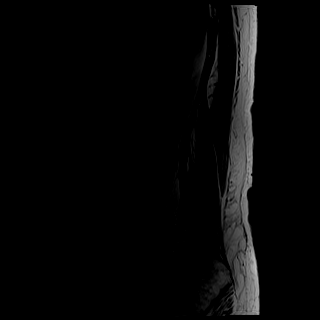

[Series 5: T2 · axial · 4.0mm · 0.39mm/px · z∈[-72,+147]mm · 8 of 44 slices shown (1 of 2)]
[im 1/44]
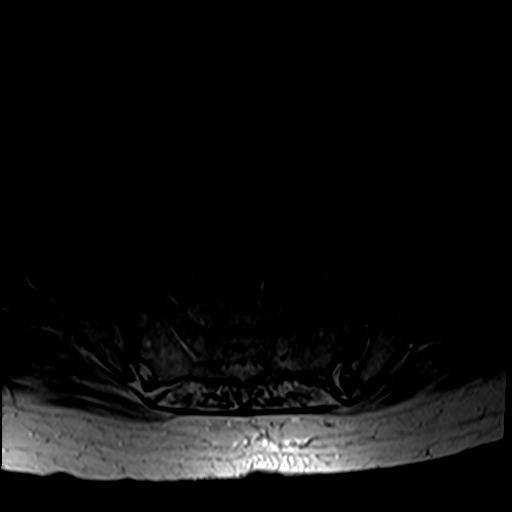
[im 5/44]
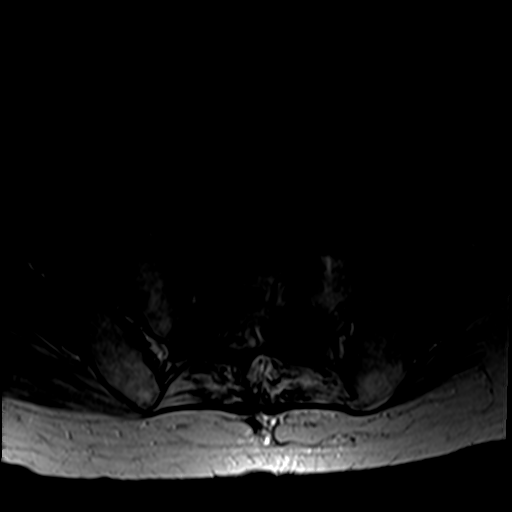
[im 15/44]
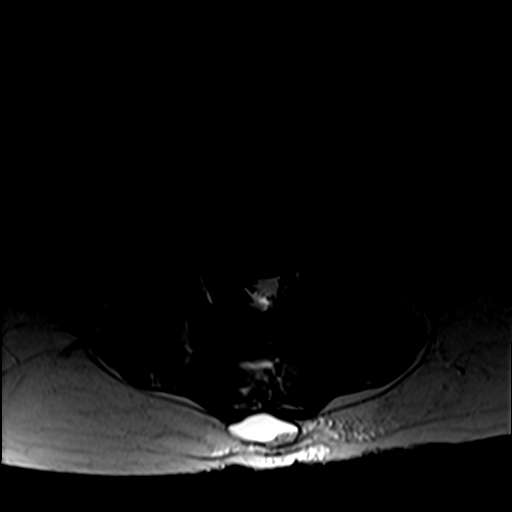
[im 20/44]
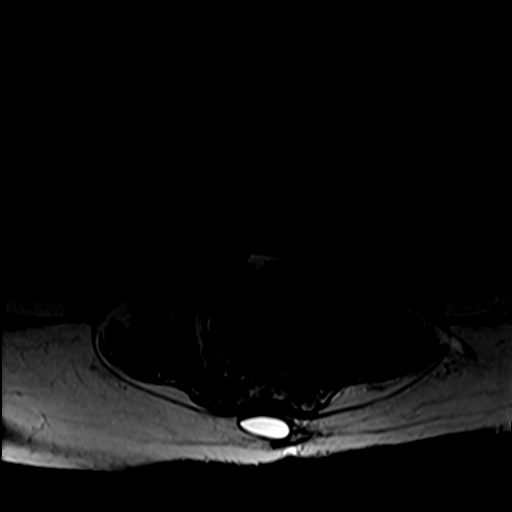
[im 24/44]
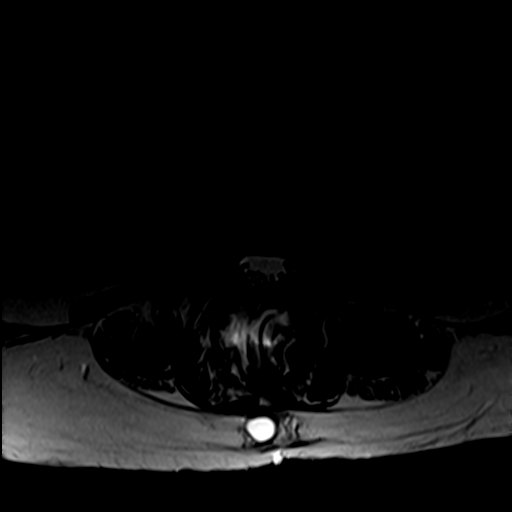
[im 29/44]
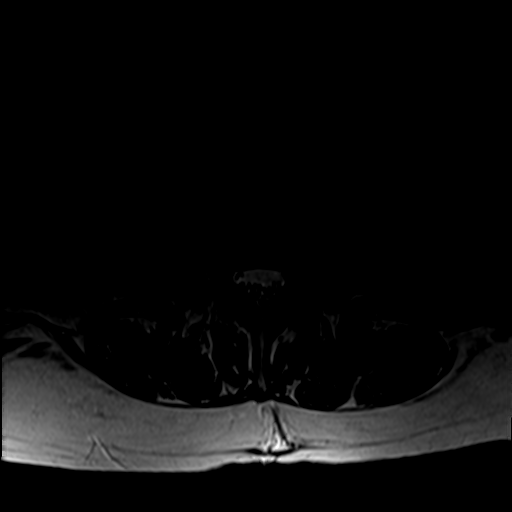
[im 39/44]
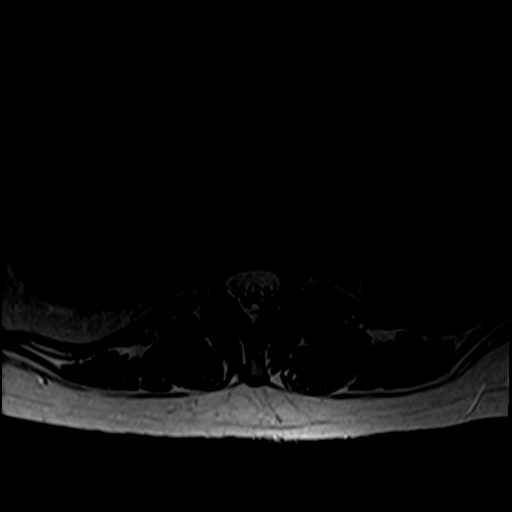
[im 44/44]
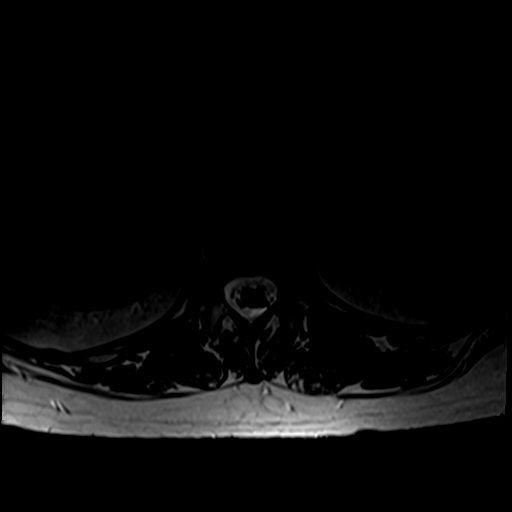

[Series 6: T1 · axial · 4.0mm · 0.39mm/px · z∈[-72,+123]mm · 7 of 44 slices shown (2 of 2)]
[im 1/44]
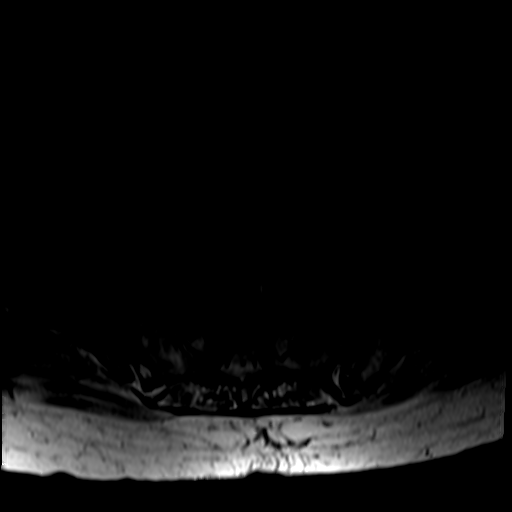
[im 5/44]
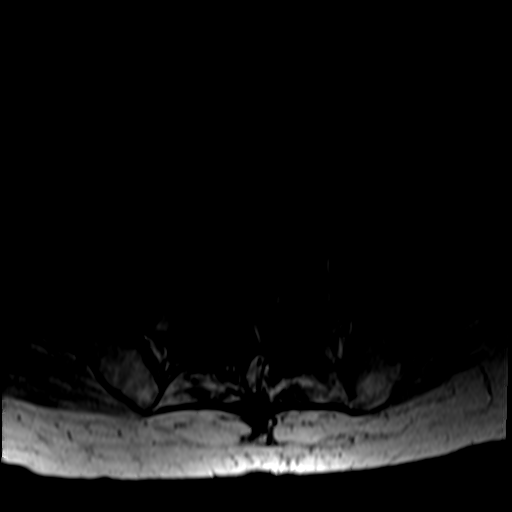
[im 15/44]
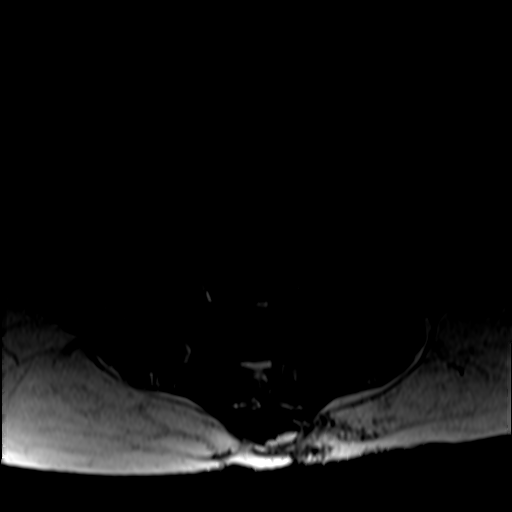
[im 20/44]
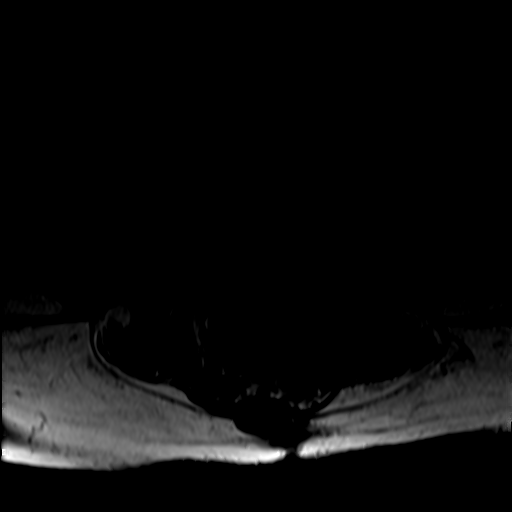
[im 24/44]
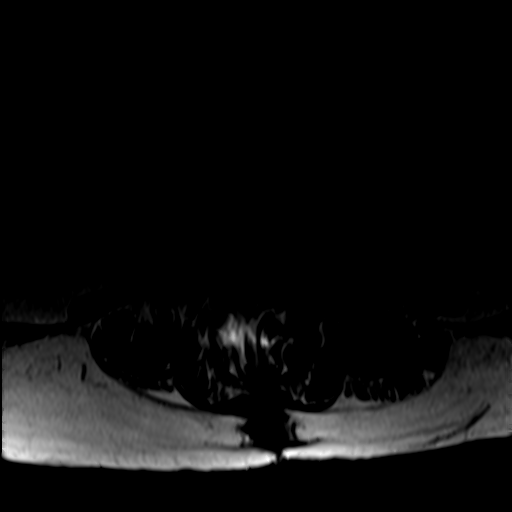
[im 29/44]
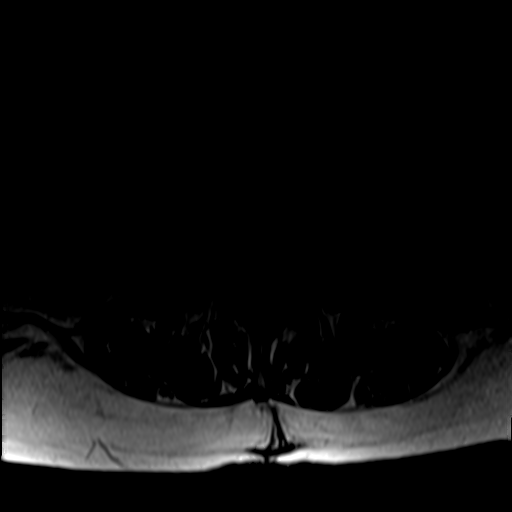
[im 39/44]
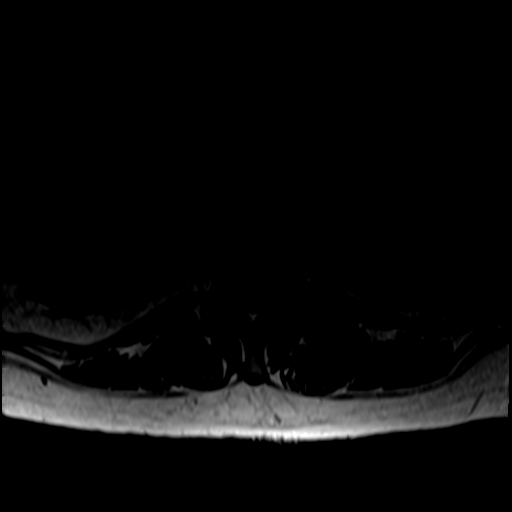

[Series 7: T2 · sagittal · 4.0mm · 1.09mm/px · 5 of 21 slices shown (2 of 2)]
[im 1/21]
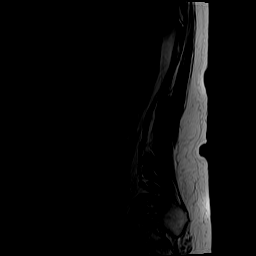
[im 6/21]
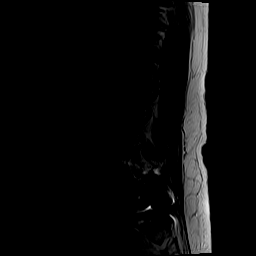
[im 11/21]
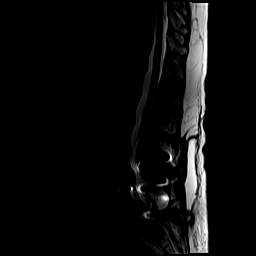
[im 16/21]
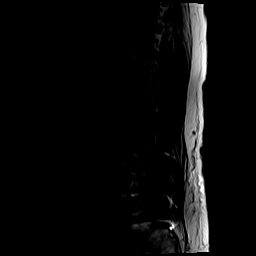
[im 21/21]
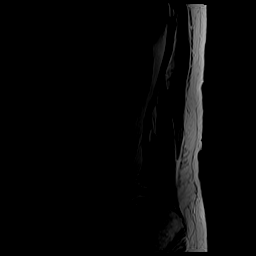

[24 of 48 positions shown; findings below may reference images not displayed]

FINDINGS: Segmentation:  Standard.

Alignment:  Stable.

Vertebrae: Postoperative changes are now present at L4-S1 with rods
and pedicle screws and interbody spacers (previously at L4-L5). The
hardware is not well evaluated on this study and there is associated
susceptibility artifact. Probable marrow edema at L5-S1.

Conus medullaris and cauda equina: Conus extends to the L1 level.
Conus and cauda equina appear normal. No evidence of arachnoiditis.

Paraspinal and other soft tissues: Chronic postoperative changes.
Small incisional and laminectomy bed fluid collections.

Disc levels:

L1-L2:  No canal or foraminal stenosis.

L2-L3:  No canal or foraminal stenosis.

L3-L4: Disc bulge. Facet arthropathy with ligamentum flavum
infolding. Minor canal stenosis. No significant foraminal stenosis.
Appearance is similar.

L4-L5: Fusion level. Enhancing epidural postoperative soft tissue.
Canal remains decompressed. Narrowing of the right foramen by
endplate osteophytes. No left foraminal stenosis.

L5-S1: Fusion level. Fluid collection in the laminectomy bed does
not exert any significant mass effect. Enhancing epidural
postoperative soft tissue. Endplate osteophytes. Persistent but
decreased narrowing of the left subarticular recess. Foramina are
distorted by artifact and there may be at least mild stenosis
bilaterally.
IMPRESSION: Degenerative and postoperative changes as detailed above. There is
decreased narrowing of the left subarticular recess at L5-S1. There
is no significant canal narrowing at any level. No definite
high-grade foraminal narrowing. The L5-S1 foramina are distorted by
artifact.

## 2022-04-25 ENCOUNTER — Other Ambulatory Visit: Payer: Self-pay | Admitting: Family Medicine

## 2022-05-03 ENCOUNTER — Other Ambulatory Visit: Payer: Self-pay

## 2022-05-03 MED ORDER — TRULICITY 3 MG/0.5ML ~~LOC~~ SOAJ: SUBCUTANEOUS | 1 refills | Status: DC

## 2022-05-16 ENCOUNTER — Ambulatory Visit (INDEPENDENT_AMBULATORY_CARE_PROVIDER_SITE_OTHER): Payer: Medicare Other | Admitting: Sports Medicine

## 2022-05-16 VITALS — BP 124/82 | Ht 62.0 in | Wt 122.0 lb

## 2022-05-16 DIAGNOSIS — M21371 Foot drop, right foot: Secondary | ICD-10-CM

## 2022-05-16 DIAGNOSIS — I2510 Atherosclerotic heart disease of native coronary artery without angina pectoris: Secondary | ICD-10-CM

## 2022-05-16 NOTE — Progress Notes (Signed)
   Subjective:    Patient ID: Michelle [name], female    DOB: Jul 16, 1950, 71 y.o.   MRN: [id number]  HPI chief complaint: Right leg pain and foot weakness  Michelle [name] presents today with several weeks of right leg pain and right foot/ankle weakness.  This has caused her to fall several times.  She recently had back surgery (August 2023).  Her symptoms seem to have started after that.  She has not yet notified her neurosurgeon.  She endorses a sharp pain in the right leg into the right great toe.    Review of Systems As above    Objective:   Physical Exam  Well-developed, well-nourished.  No acute distress  Right leg: Patient has 4/5 strength with resisted great toe extension on the right compared to 5/5 on the left.  Ankle dorsiflexion and plantarflexion are 5/5.  No noticeable atrophy.      Assessment & Plan:   Right foot/ankle weakness status post recent lumbar spine surgery  I explained to Khari the importance of notifying her neurosurgery of this change.  She reassures me that she will do so.  I would also like to order an EMG/nerve conduction study to evaluate for possible foot drop which may be responsible for her findings.  Phone follow-up with those findings when available.  In the meantime, I recommended she get an AFO to help prevent future falls while we await the results of her EMG/nerve conduction study.  This note was dictated using Dragon naturally speaking software and may contain errors in syntax, spelling, or content which have not been identified prior to signing this note.

## 2022-05-21 MED ORDER — TRULICITY 3 MG/0.5ML ~~LOC~~ SOAJ: SUBCUTANEOUS | 1 refills | Status: DC

## 2022-05-21 NOTE — Addendum Note (Signed)
Addended by: Talbot Grumbling on: 05/21/2022 01:48 PM   Modules accepted: Orders

## 2022-05-21 NOTE — Telephone Encounter (Signed)
Patient returns call to nurse line regarding Trulicity prescription.   Prescription was set to print, therefore, [facility] never received.   Resent electronically.   Called patient and provided with update.   Talbot Grumbling, RN

## 2022-05-22 ENCOUNTER — Encounter: Payer: Self-pay | Admitting: Neurology

## 2022-05-27 NOTE — Progress Notes (Unsigned)
    SUBJECTIVE:   CHIEF COMPLAINT / HPI:  No chief complaint on file.   T2DM Takes metformin 1000 mg BID and dulaglutide 3 mg weekly.  Since our last visit, she had back surgery in August.  Appears she has been having some right leg pain and right foot/ankle weakness following the surgery and was recently seen by sports medicine.  They have ordered EMG, recommended AFO, and patient was advised to notify her neurosurgeon.  PERTINENT  PMH / PSH: T2DM, RA, chronic pain, HLD, GAD  Patient Care Team: [name], MD as PCP - General (Family Medicine) Alda Berthold, DO as Consulting Physician (Neurology)   OBJECTIVE:   There were no vitals taken for this visit.  Physical Exam      02/19/2022    1:43 PM  Depression screen PHQ 2/9  Decreased Interest 0  Down, Depressed, Hopeless 0  PHQ - 2 Score 0  Altered sleeping 1  Tired, decreased energy 1  Change in appetite 0  Feeling bad or failure about yourself  1  Trouble concentrating 0  Moving slowly or fidgety/restless 0  Suicidal thoughts 0  PHQ-9 Score 3  Difficult doing work/chores Not difficult at all     {Show previous vital signs (optional):23777}  {Labs  Heme  Chem  Endocrine  Serology  Results Review (optional):23779}  ASSESSMENT/PLAN:   No problem-specific Assessment & Plan notes found for this encounter.    No follow-ups on file.   [name], MD [place]

## 2022-05-27 NOTE — Patient Instructions (Incomplete)
It was nice seeing you today!  Stop the losartan and Trulicity for now.  I would recommend Zyrtec instead of hydroxyzine for itching.  Get your X-ray at: [facility] Address: [address], [place], [place] [address] Phone: [phone]   Come back and see me in 1 month.  Stay well, Littie Deeds, MD [facility] [phone]  --  Make sure to check out at the front desk before you leave today.  Please arrive at least 15 minutes prior to your scheduled appointments.  If you had blood work today, I will send you a MyChart message or a letter if results are normal. Otherwise, I will give you a call.  If you had a referral placed, they will call you to set up an appointment. Please give [place] a call if you don't hear back in the next 2 weeks.  If you need additional refills before your next appointment, please call your pharmacy first.

## 2022-05-28 ENCOUNTER — Telehealth: Payer: Self-pay

## 2022-05-28 ENCOUNTER — Encounter: Payer: Self-pay | Admitting: Family Medicine

## 2022-05-28 ENCOUNTER — Ambulatory Visit (INDEPENDENT_AMBULATORY_CARE_PROVIDER_SITE_OTHER): Payer: Medicare Other | Admitting: Family Medicine

## 2022-05-28 VITALS — BP 108/62 | HR 110 | Wt 120.0 lb

## 2022-05-28 DIAGNOSIS — I152 Hypertension secondary to endocrine disorders: Secondary | ICD-10-CM | POA: Diagnosis not present

## 2022-05-28 DIAGNOSIS — M5416 Radiculopathy, lumbar region: Secondary | ICD-10-CM | POA: Diagnosis not present

## 2022-05-28 DIAGNOSIS — R051 Acute cough: Secondary | ICD-10-CM

## 2022-05-28 DIAGNOSIS — E1142 Type 2 diabetes mellitus with diabetic polyneuropathy: Secondary | ICD-10-CM | POA: Diagnosis present

## 2022-05-28 DIAGNOSIS — R634 Abnormal weight loss: Secondary | ICD-10-CM | POA: Diagnosis not present

## 2022-05-28 DIAGNOSIS — M542 Cervicalgia: Secondary | ICD-10-CM

## 2022-05-28 DIAGNOSIS — E1159 Type 2 diabetes mellitus with other circulatory complications: Secondary | ICD-10-CM

## 2022-05-28 DIAGNOSIS — I2510 Atherosclerotic heart disease of native coronary artery without angina pectoris: Secondary | ICD-10-CM

## 2022-05-28 MED ORDER — OXYCODONE-ACETAMINOPHEN 10-325 MG PO TABS: 1.0000 | ORAL_TABLET | Freq: Four times a day (QID) | ORAL | 0 refills | Status: DC | PRN

## 2022-05-28 NOTE — Assessment & Plan Note (Addendum)
Significant weight loss 17 pounds over 3 months due to decreased appetite over the past few months.  Unsure if related to recent spine surgery or current upper respiratory illness which has been ongoing for 3 weeks with cough and congestion.  Due to high fever (now resolved) and subjective shortness of breath, will check CXR to evaluate for pneumonia.  Recent outside labs from 1 month prior reviewed, unremarkable. - CXR - d/c Trulicity - f/u 1 month

## 2022-05-28 NOTE — Assessment & Plan Note (Addendum)
She is status post recent lumbar surgery 3 months ago and has developed right-sided radicular symptoms and what appears to be intermittent foot drop which is currently being worked up by sports medicine.  I advised her to reach out to her neurosurgeon regarding these issues which I think are likely related to her surgery. - advised neurosurgery follow-up - offered PT, patient declined

## 2022-05-28 NOTE — Assessment & Plan Note (Signed)
Has been well-controlled.  With weight loss and decreased appetite, will stop GLP-1 for now. - continue metformin - d/c dulaglutide - check A1c

## 2022-05-28 NOTE — Telephone Encounter (Signed)
Called patient and informed. Patient appreciative.   Talbot Grumbling, RN

## 2022-05-28 NOTE — Assessment & Plan Note (Signed)
With borderline low normal BP and general malaise, will discontinue losartan for now.

## 2022-05-28 NOTE — Telephone Encounter (Signed)
Cervical spine x-ray order placed

## 2022-05-28 NOTE — Telephone Encounter (Signed)
Patient calls nurse line regarding imaging. She states that she is going to get chest x-ray tomorrow morning. She is asking if Dr. Nancy Fetter can also place an xray order for her neck. She states that she discussed neck pain with provider and would like to get imaging of neck while there.   Will forward to PCP.   Talbot Grumbling, RN

## 2022-05-29 ENCOUNTER — Ambulatory Visit
Admission: RE | Admit: 2022-05-29 | Discharge: 2022-05-29 | Disposition: A | Discharge status: Home / Self Care | Payer: Medicare Other | Source: Ambulatory Visit | Attending: Family Medicine | Admitting: Family Medicine

## 2022-05-29 DIAGNOSIS — M542 Cervicalgia: Secondary | ICD-10-CM

## 2022-05-29 DIAGNOSIS — R051 Acute cough: Secondary | ICD-10-CM

## 2022-05-29 LAB — HEMOGLOBIN A1C
Est. average glucose Bld gHb Est-mCnc: 128 mg/dL
Hgb A1c MFr Bld: 6.1 % — ABNORMAL HIGH (ref 4.8–5.6)

## 2022-05-30 ENCOUNTER — Encounter: Payer: Self-pay | Admitting: Family Medicine

## 2022-06-13 ENCOUNTER — Other Ambulatory Visit: Payer: Self-pay | Admitting: Family Medicine

## 2022-06-14 MED ORDER — OMEPRAZOLE 40 MG PO CPDR: 40.0000 mg | DELAYED_RELEASE_CAPSULE | Freq: Two times a day (BID) | ORAL | 0 refills | Status: DC

## 2022-06-14 NOTE — Addendum Note (Signed)
Addended by: Talbot Grumbling on: 06/14/2022 10:48 AM   Modules accepted: Orders

## 2022-06-14 NOTE — Telephone Encounter (Signed)
Patient calls nurse line stating that she no longer uses [facility] for prescriptions. She is requesting that rx is transferred to [facility].   Called and canceled at [facility]. Resent to [facility].   Talbot Grumbling, RN

## 2022-06-15 ENCOUNTER — Other Ambulatory Visit: Payer: Self-pay | Admitting: Dermatology

## 2022-06-15 DIAGNOSIS — T148XXA Other injury of unspecified body region, initial encounter: Secondary | ICD-10-CM

## 2022-06-17 ENCOUNTER — Other Ambulatory Visit: Payer: Self-pay

## 2022-06-17 DIAGNOSIS — M21372 Foot drop, left foot: Secondary | ICD-10-CM

## 2022-06-18 ENCOUNTER — Ambulatory Visit (INDEPENDENT_AMBULATORY_CARE_PROVIDER_SITE_OTHER): Payer: Medicare Other | Admitting: Neurology

## 2022-06-18 DIAGNOSIS — M21372 Foot drop, left foot: Secondary | ICD-10-CM | POA: Diagnosis not present

## 2022-06-18 DIAGNOSIS — M5416 Radiculopathy, lumbar region: Secondary | ICD-10-CM

## 2022-06-18 NOTE — Procedures (Signed)
  [facility]  [address] [place]  [place], [place] [address] Tel: [phone] Fax: [phone] Test Date:  06/18/2022  Patient: Emmali Karow DOB: 07-Aug-1950 Physician: Narda Amber, DO  Sex: Female Height: '5\' 2"'$  Ref Phys: Lilia Argue, DO  ID#: [id number]   Technician:    History: This is a 71 year old female referred for evaluation of right leg weakness.  NCV & EMG Findings: Extensive electrodiagnostic testing of the right lower extremity shows:  Right sural and superficial peroneal sensory responses are within normal limits. Right peroneal motor response is reduced at the extensor digitorum brevis (1.2 mV), and normal at the tibialis anterior.  Right tibial motor response is within normal limits.  Right tibial H reflex study is within normal limits. Chronic motor axonal loss changes are seen affecting the right ankle 3-4 myotome, without accompanying active denervation.   Impression: Chronic lumbar radiculopathy affecting the right L3-4 nerve root/segment, mild. There is no evidence of a right common peroneal neuropathy or large fiber sensorimotor neuropathy.   ___________________________ Narda Amber, DO    Nerve Conduction Studies   Stim Site NR Peak (ms) Norm Peak (ms) O-P Amp (V) Norm O-P Amp  Right Sup Peroneal Anti Sensory (Ant Lat Mall)  32 C  12 cm    2.6 <4.6 4.8 >3  Right Sural Anti Sensory (Lat Mall)  32 C  Calf    3.0 <4.6 10.7 >3     Stim Site NR Onset (ms) Norm Onset (ms) O-P Amp (mV) Norm O-P Amp Site1 Site2 Delta-0 (ms) Dist (cm) Vel (m/s) Norm Vel (m/s)  Right Peroneal Motor (Ext Dig Brev)  32 C  Ankle    4.2 <6.0 *1.2 >2.5 B Fib Ankle 7.8 36.0 46 >40  B Fib    12.0  1.1  Poplt B Fib 1.9 8.0 42 >40  Poplt    13.9  1.0         Right Peroneal TA Motor (Tib Ant)  32 C  Fib Head    3.4 <4.5 3.5 >3 Poplit Fib Head 1.4 8.0 57 >40  Poplit    4.8 <5.7 3.3         Right Tibial Motor (Abd Hall Brev)  32 C  Ankle    5.2 <6.0  7.2 >4 Knee Ankle 7.5 39.0 52 >40  Knee    12.7  5.4          Electromyography   Side Muscle Ins.Act Fibs Fasc Recrt Amp Dur Poly Activation Comment  Right AntTibialis Nml Nml Nml Nml Nml Nml Nml Nml N/A  Right Gastroc Nml Nml Nml Nml Nml Nml Nml Nml N/A  Right Flex Dig Long Nml Nml Nml Nml Nml Nml Nml Nml N/A  Right RectFemoris Nml Nml Nml *1- *1+ *1+ *1+ Nml N/A  Right BicepsFemS Nml Nml Nml Nml Nml Nml Nml Nml N/A  Right GluteusMed Nml Nml Nml Nml Nml Nml Nml Nml N/A  Right AdductorLong Nml Nml Nml *1- *1+ *1+ *1+ Nml N/A      Waveforms:

## 2022-06-21 ENCOUNTER — Ambulatory Visit (INDEPENDENT_AMBULATORY_CARE_PROVIDER_SITE_OTHER): Payer: Medicare Other | Admitting: Family Medicine

## 2022-06-21 ENCOUNTER — Encounter: Payer: Self-pay | Admitting: Family Medicine

## 2022-06-21 VITALS — BP 135/72 | HR 97 | Ht 62.0 in | Wt 120.1 lb

## 2022-06-21 DIAGNOSIS — M67432 Ganglion, left wrist: Secondary | ICD-10-CM | POA: Diagnosis not present

## 2022-06-21 DIAGNOSIS — M5416 Radiculopathy, lumbar region: Secondary | ICD-10-CM

## 2022-06-21 DIAGNOSIS — I152 Hypertension secondary to endocrine disorders: Secondary | ICD-10-CM | POA: Diagnosis not present

## 2022-06-21 DIAGNOSIS — I2510 Atherosclerotic heart disease of native coronary artery without angina pectoris: Secondary | ICD-10-CM

## 2022-06-21 DIAGNOSIS — R634 Abnormal weight loss: Secondary | ICD-10-CM

## 2022-06-21 DIAGNOSIS — E1159 Type 2 diabetes mellitus with other circulatory complications: Secondary | ICD-10-CM

## 2022-06-21 NOTE — Assessment & Plan Note (Signed)
Clinically she has recurrence of her ganglion cyst that was ?aspirated in the remote past.  Advised that she follow-up with sports medicine to discuss possible aspiration if it becomes bothersome or increasing in size.

## 2022-06-21 NOTE — Assessment & Plan Note (Signed)
She is still having decreased appetite but fortunately weight has remained stable in the past month.  Will continue to monitor.  She will stay off her Trulicity for the time being.

## 2022-06-21 NOTE — Patient Instructions (Addendum)
It was nice seeing you today!  Try to check your blood pressure at least a few times a week and write them down below.  Stay well, [name], MD [place] [phone]  --  Make sure to check out at the front desk before you leave today.  Please arrive at least 15 minutes prior to your scheduled appointments.  If you had blood work today, I will send you a MyChart message or a letter if results are normal. Otherwise, I will give you a call.  If you had a referral placed, they will call you to set up an appointment. Please give [place] a call if you don't hear back in the next 2 weeks.  If you need additional refills before your next appointment, please call your pharmacy first.  -- Blood Pressure Record Sheet To take your blood pressure, you will need a blood pressure machine. You can buy a blood pressure machine (blood pressure monitor) at your clinic, drug store, or online. When choosing one, consider: An automatic monitor that has an arm cuff. A cuff that wraps snugly around your upper arm. You should be able to fit only one finger between your arm and the cuff. A device that stores blood pressure reading results. Do not choose a monitor that measures your blood pressure from your wrist or finger. Follow your health care provider's instructions for how to take your blood pressure. To use this form: Take your blood pressure medications every day These measurements should be taken when you have been at rest for at least 10-15 min Take at least 2 readings with each blood pressure check. This makes sure the results are correct. Wait 1-2 minutes between measurements. Write down the results in the spaces on this form. Keep in mind it should always be recorded systolic over diastolic. Both numbers are important.  Repeat this every day for 2-3 weeks, or as told by your health care provider.  Make a follow-up appointment with your health care provider to discuss  the results.  Blood Pressure Log Date Medications taken? (Y/N) Blood Pressure Time of Day

## 2022-06-21 NOTE — Assessment & Plan Note (Signed)
Again advised patient to follow-up with her neurosurgeon regarding her right-sided radicular symptoms after recent lumbar surgery

## 2022-06-21 NOTE — Progress Notes (Signed)
SUBJECTIVE:   CHIEF COMPLAINT / HPI:  Chief Complaint  Patient presents with   Follow-up    At last visit about 1 month prior I saw this patient for routine follow-up.  At that time she was having some significant weight loss of 17 pounds over 3 months but was also having some upper respiratory symptoms for 3 weeks.  CXR obtained which was unremarkable.  I stopped her Trulicity and losartan for the time being.  Today, patient reports that she is still not having very much appetite.  Weight has gone down as low as 118 pounds.  She is wondering when she can restart her Trulicity and losartan.  She is still having some cough which she thought maybe is due to asthma so she has been taking her inhalers more often.  She does use a wood stove which she has always had.  Regarding her right foot drop, she is not yet reached out to her neurosurgeon. She did have a EMG recently which showed evidence of chronic lumbar radiculopathy affecting the right L3-L4 nerve roots, no evidence of right common peroneal neuropathy or large fiber sensorimotor neuropathy.  She has been fitted for AFO but has not received it yet.  She reports she has been walking carefully and has not had any recent falls.  She also reports she has had recurrence of a ganglion cyst of her left wrist.  She reports she had this aspirated many years ago by a Copy.  It has been increasing in size and if it continues to grow, she would like to have this removed.  PERTINENT  PMH / PSH: T2DM, RA, chronic pain, HLD, GAD  Patient Care Team: [name], MD as PCP - General (Family Medicine) Alda Berthold, DO as Consulting Physician (Neurology)   OBJECTIVE:   BP 135/72   Pulse 97   Ht '5\' 2"'$  (1.575 m)   Wt 120 lb 2 oz (54.5 kg)   SpO2 99%   BMI 21.97 kg/m   Physical Exam Constitutional:      General: She is not in acute distress.    Appearance: She is not ill-appearing or toxic-appearing.  HENT:     Head: Normocephalic  and atraumatic.  Cardiovascular:     Rate and Rhythm: Normal rate and regular rhythm.  Pulmonary:     Effort: Pulmonary effort is normal. No respiratory distress.     Comments: Fine diffuse crackles throughout Musculoskeletal:     Cervical back: Neck supple.  Skin:    Comments: Mass noted on dorsum of left wrist  Neurological:     Mental Status: She is alert.         06/21/2022    8:51 AM  Depression screen PHQ 2/9  Decreased Interest 0  Down, Depressed, Hopeless 0  PHQ - 2 Score 0  Altered sleeping 0  Tired, decreased energy 1  Change in appetite 1  Feeling bad or failure about yourself  0  Trouble concentrating 0  Moving slowly or fidgety/restless 0  Suicidal thoughts 0  PHQ-9 Score 2     Wt Readings from Last 3 Encounters:  06/21/22 120 lb 2 oz (54.5 kg)  05/28/22 120 lb (54.4 kg)  05/16/22 122 lb (55.3 kg)        ASSESSMENT/PLAN:   Weight loss She is still having decreased appetite but fortunately weight has remained stable in the past month.  Will continue to monitor.  She will stay off her Trulicity for the  time being.  Hypertension associated with diabetes ([place]) BP adequately controlled.  She will stay off losartan for now and advised patient to monitor BP at home.  She will call me if she has persistently elevated readings greater than 140/90 and could consider restarting losartan.  Lumbar radiculopathy Again advised patient to follow-up with her neurosurgeon regarding her right-sided radicular symptoms after recent lumbar surgery  Ganglion cyst of dorsum of left wrist Clinically she has recurrence of her ganglion cyst that was ?aspirated in the remote past.  Advised that she follow-up with sports medicine to discuss possible aspiration if it becomes bothersome or increasing in size.    Return in about 2 months (around 08/22/2022) for f/u diabetes.   [name], MD [place]

## 2022-06-21 NOTE — Assessment & Plan Note (Signed)
BP adequately controlled.  She will stay off losartan for now and advised patient to monitor BP at home.  She will call me if she has persistently elevated readings greater than 140/90 and could consider restarting losartan.

## 2022-06-26 ENCOUNTER — Ambulatory Visit (INDEPENDENT_AMBULATORY_CARE_PROVIDER_SITE_OTHER): Payer: Medicare Other | Admitting: Family Medicine

## 2022-06-26 ENCOUNTER — Encounter: Payer: Self-pay | Admitting: Family Medicine

## 2022-06-26 VITALS — BP 136/78 | HR 116 | Ht 62.0 in | Wt 121.0 lb

## 2022-06-26 DIAGNOSIS — I2510 Atherosclerotic heart disease of native coronary artery without angina pectoris: Secondary | ICD-10-CM | POA: Diagnosis not present

## 2022-06-26 DIAGNOSIS — J45909 Unspecified asthma, uncomplicated: Secondary | ICD-10-CM | POA: Diagnosis not present

## 2022-06-26 DIAGNOSIS — R040 Epistaxis: Secondary | ICD-10-CM | POA: Diagnosis present

## 2022-06-26 DIAGNOSIS — M5416 Radiculopathy, lumbar region: Secondary | ICD-10-CM

## 2022-06-26 NOTE — Assessment & Plan Note (Signed)
Again stressed the importance of following with her neurosurgeon regarding this issue

## 2022-06-26 NOTE — Patient Instructions (Addendum)
It was nice seeing you today!  For your nosebleeds, I recommend using a humidifier.  You can also try some over-the-counter saline nasal gel or use plain Vaseline for your nostrils.  Make sure to take your Breo every day.  Make sure to get in touch with your neurosurgeon.  Stay well, [name], MD [place] [phone]  --  Make sure to check out at the front desk before you leave today.  Please arrive at least 15 minutes prior to your scheduled appointments.  If you had blood work today, I will send you a MyChart message or a letter if results are normal. Otherwise, I will give you a call.  If you had a referral placed, they will call you to set up an appointment. Please give [place] a call if you don't hear back in the next 2 weeks.  If you need additional refills before your next appointment, please call your pharmacy first.

## 2022-06-26 NOTE — Assessment & Plan Note (Signed)
She has lingering cough after a recent viral illness.  Cough could also be related to uncontrolled asthma as she is not using her controller inhaler. - advised to take Adair Patter daily

## 2022-06-26 NOTE — Progress Notes (Signed)
    SUBJECTIVE:   CHIEF COMPLAINT / HPI:  Chief Complaint  Patient presents with   Epistaxis    Patient reports that she has been having nosebleeds for the past 3 days.  She has had 2 packed her nose of tissues which does resolve her nosebleed.  She reports she had a deviated septum repair many years ago.  She also reports that she has continues to have an intermittent cough that occurs a few times a day.  She is frequently using her albuterol inhaler but has not been using her Breo elliptica.  Denies any congestion, sinus pressure at this time.  She is still having radicular pain going down the right side of her leg and having a bit of a foot drop as well.  She has not gone in touch with her neurosurgeon.  No recent falls.  PERTINENT  PMH / PSH: T2DM, RA, chronic pain, HLD, GAD  Patient Care Team: [name], MD as PCP - General (Family Medicine) Alda Berthold, DO as Consulting Physician (Neurology)   OBJECTIVE:   BP 136/78   Pulse (!) 116   Ht '5\' 2"'$  (1.575 m)   Wt 121 lb (54.9 kg)   SpO2 94%   BMI 22.13 kg/m   Physical Exam Constitutional:      General: She is not in acute distress. HENT:     Nose: Nose normal.     Comments: No active bleeding Cardiovascular:     Rate and Rhythm: Normal rate and regular rhythm.  Pulmonary:     Effort: Pulmonary effort is normal. No respiratory distress.     Breath sounds: Rales present.     Comments: Fine diffuse rales Musculoskeletal:     Cervical back: Neck supple.  Neurological:     Mental Status: She is alert.     Comments: 4+/5 strength with dorsiflexion, plantarflexion, and extension/flexion at the knee.  Full strength in the right lower extremity.         06/26/2022   10:39 AM  Depression screen PHQ 2/9  Decreased Interest 0  Down, Depressed, Hopeless 0  PHQ - 2 Score 0  Altered sleeping 0  Tired, decreased energy 0  Change in appetite 0  Feeling bad or failure about yourself  0  Trouble concentrating 0   Moving slowly or fidgety/restless 0  Suicidal thoughts 0  PHQ-9 Score 0     Wt Readings from Last 3 Encounters:  06/26/22 121 lb (54.9 kg)  06/21/22 120 lb 2 oz (54.5 kg)  05/28/22 120 lb (54.4 kg)        ASSESSMENT/PLAN:   Nosebleeds No active bleeding currently.  Advised to obtain humidifier and use either nasal saline ointment or Vaseline.  Lumbar radiculopathy Again stressed the importance of following with her neurosurgeon regarding this issue  Asthma, chronic She has lingering cough after a recent viral illness.  Cough could also be related to uncontrolled asthma as she is not using her controller inhaler. - advised to take Adair Patter daily    Return if symptoms worsen or fail to improve.   [name], MD [place]

## 2022-07-01 DIAGNOSIS — I509 Heart failure, unspecified: Secondary | ICD-10-CM

## 2022-07-01 HISTORY — DX: Heart failure, unspecified: I50.9

## 2022-07-03 DIAGNOSIS — M21371 Foot drop, right foot: Secondary | ICD-10-CM | POA: Diagnosis not present

## 2022-07-12 ENCOUNTER — Ambulatory Visit (INDEPENDENT_AMBULATORY_CARE_PROVIDER_SITE_OTHER): Payer: Medicare Other | Admitting: Podiatry

## 2022-07-12 ENCOUNTER — Encounter: Payer: Self-pay | Admitting: Podiatry

## 2022-07-12 VITALS — BP 144/76

## 2022-07-12 DIAGNOSIS — M2042 Other hammer toe(s) (acquired), left foot: Secondary | ICD-10-CM | POA: Diagnosis not present

## 2022-07-12 DIAGNOSIS — E119 Type 2 diabetes mellitus without complications: Secondary | ICD-10-CM | POA: Diagnosis not present

## 2022-07-12 DIAGNOSIS — M79675 Pain in left toe(s): Secondary | ICD-10-CM | POA: Diagnosis not present

## 2022-07-12 DIAGNOSIS — M79674 Pain in right toe(s): Secondary | ICD-10-CM

## 2022-07-12 DIAGNOSIS — G629 Polyneuropathy, unspecified: Secondary | ICD-10-CM

## 2022-07-12 DIAGNOSIS — B351 Tinea unguium: Secondary | ICD-10-CM | POA: Diagnosis not present

## 2022-07-12 DIAGNOSIS — E1151 Type 2 diabetes mellitus with diabetic peripheral angiopathy without gangrene: Secondary | ICD-10-CM | POA: Diagnosis not present

## 2022-07-12 DIAGNOSIS — M21371 Foot drop, right foot: Secondary | ICD-10-CM

## 2022-07-12 DIAGNOSIS — M2041 Other hammer toe(s) (acquired), right foot: Secondary | ICD-10-CM

## 2022-07-12 NOTE — Progress Notes (Unsigned)
ANNUAL DIABETIC FOOT EXAM  Subjective: Michelle [name] presents today {jgcomplaint:23593}.  Chief Complaint  Patient presents with   Nail Problem    DFC BS-did not check today A1C-6.? PCP-Sun, Richard PCP VST-"Couple weeks ago"    Patient confirms h/o diabetes.  Patient relates {Numbers; 0-100:15068} year h/o diabetes.  Patient denies any h/o foot wounds.  Patient has h/o foot ulcer of {jgPodToeLocator:23637}, which healed via help of ***.  Patient has h/o amputation(s): {jgamp:23617}.  Patient admits symptoms of foot numbness.   Patient admits symptoms of foot tingling.  Patient admits symptoms of burning in feet.  Patient admits symptoms of pins/needles sensation in feet.  Patient denies any numbness, tingling, burning, or pins/needle sensation in feet.  Patient has been diagnosed with neuropathy and it is managed with {JGNEUROPATHYMEDS:27053}.  Patient's blood sugar was *** mg/dl {Time; today/yesterday/ 2 days ago:19188}. Last known  HgA1c was ***%   Patient did not check blood glucose this morning.  Patient does not monitor blood glucose daily.  Risk factors: {jgriskfactors:24044}.  [name], MD is patient's PCP. Last visit was {Time; dates multiple:15870}***.  Past Medical History:  Diagnosis Date   Actinic keratosis    Allergic rhinitis 08/28/2006   Anemia    low iron   Anginal pain (HCC)    Asthma, chronic 03/14/2010   Does seem to use her albuterol excessively. Consider change to more manageable medicines and treat more like COPD in the future.   Carpal tunnel syndrome, left 05/29/2009   Last Assessment & Plan:  Advised to wear wrist brace at night when she is sleeping.  If this does not help will consider having her go to sports medicine.   Chronic frontal sinusitis 08/28/2016   Chronic left maxillary sinusitis 08/28/2016   Diverticulosis    Essential hypertension 10/30/2012   Gastroparesis 11/19/2011   DM and chronic narcotics contribute.   Causes dysphagia like symptoms    Generalized anxiety disorder 08/28/2006   GERD (gastroesophageal reflux disease)    Hearing loss 07/19/2015   Hearing aid in right ear   History of cataracts, bilateral    Hyperlipidemia 08/28/2006   Lumbar radiculopathy 04/07/2018   Lung nodules    Osteoporosis 08/08/2011   Pain in joint, pelvic region and thigh 11/18/2012   Radial styloid tenosynovitis 08/15/2013   Restless leg syndrome 05/14/2012   Seropositive rheumatoid arthritis 01/08/2010   Patient is being followed by rheumatologist at [facility] patient is on Plaquenil and as well as prednisone.   Changed to methotrexate and prednisone in May of 2013  Patient does have a pain contract here with [facility] receives oxycodone 10/325 mg tabs every 6 hours. This has been titrated down from significant doses of methadone previously. Could attempt to tit   Shoulder joint pain 03/25/2011   The patient is compensating due to her not being able to use her left arm right now rotator cuff impingement if he continues to worsen would not be surprised if she does get a tear on this side as well   Squamous cell carcinoma in situ of skin 11/25/2014   Referred to dermatology for excision    Squamous cell carcinoma of skin 12/26/2014   Right distal pretibial. SCC-KA pattern.   Squamous cell carcinoma of skin 03/27/2015   Right inf. lat. knee. SCC-KA pattern.    Squamous cell carcinoma of skin 10/07/2016   Left lateral calf superior. WD SCC.   Squamous cell carcinoma of skin 10/07/2016   Left  lateral calf inferior. WD SCC.   Squamous cell carcinoma of skin 04/28/2018   Left mid med. pretibial. WD SCC.   Squamous cell carcinoma of skin 08/12/2019   Left mid med. pretibial sup. KA type. EDC   Squamous cell carcinoma of skin 08/12/2019   Left mid med. pretibial inferior. WD. EDC.   Tear of medial meniscus of knee, left 04/27/2009   Type II diabetes mellitus 08/28/2006    Overview:  Patient has her ups and downs, go in line with flares of her RA and prednisone uses.  Patient does have hx of hypoglycemic events so would not try for perfect control but should have A1c goal around 7.0 Lab Results  Component Value Date   HGBA1C 6.9 12/27/2011   Last Assessment & Plan:  At goal. Will continue current metformin   Patient Active Problem List   Diagnosis Date Noted   Ganglion cyst of dorsum of left wrist 06/21/2022   Pseudoarthrosis of lumbar spine 02/26/2022   Lumbar spondylosis 11/28/2021   Cicatricial entropion of both upper eyelids 10/10/2021   Exposure keratopathy, bilateral 10/10/2021   Ptosis of both eyelids 10/10/2021   Senile ectropion of both lower eyelids 10/10/2021   Bilateral sacroiliitis ([place]) 05/18/2021   Status post lumbar spinal fusion 09/21/2020   Bowel and bladder incontinence 03/05/2020   Balance problem 12/11/2018   Onychomycosis 12/02/2018   Lumbar radiculopathy 04/07/2018   Left hip pain 09/11/2017   Urge incontinence of urine 04/18/2017   Chronic frontal sinusitis 08/28/2016   Chronic left maxillary sinusitis 08/28/2016   Hearing loss 07/19/2015   Squamous cell carcinoma in situ of skin 11/25/2014   Encounter for chronic pain management 08/05/2014   Mild cognitive impairment 07/05/2014   Pulmonary nodules 03/02/2014   Weight loss 03/02/2014   Hypertension associated with diabetes ([place]) 10/30/2012   Irritable bowel 07/13/2012   Restless leg syndrome 05/14/2012   Gastroparesis 11/19/2011   Dysphagia 09/10/2011   Osteoporosis 08/08/2011   Osteopenia 08/08/2011   Shoulder joint pain 03/25/2011   Restrictive lung disease 08/09/2010   Asthma, chronic 03/14/2010   Seropositive rheumatoid arthritis 01/08/2010   Carpal tunnel syndrome, left 05/29/2009   GERD (gastroesophageal reflux disease) 08/22/2008   Type II diabetes mellitus ([place]) 08/28/2006   Hyperlipidemia associated with type 2 diabetes mellitus ([place]) 08/28/2006   Generalized  anxiety disorder 08/28/2006   Allergic rhinitis 08/28/2006   Past Surgical History:  Procedure Laterality Date   ABDOMINAL HYSTERECTOMY     APPENDECTOMY     BIOPSY  05/10/2021   Procedure: BIOPSY;  Surgeon: Milus Banister, MD;  Location: [facility];  Service: Endoscopy;;  EGD and COLON   CARPAL TUNNEL RELEASE Right    CATARACT EXTRACTION Left    with lid lift   CHOLECYSTECTOMY     COLONOSCOPY     COLONOSCOPY WITH PROPOFOL N/A 05/10/2021   Procedure: COLONOSCOPY WITH PROPOFOL;  Surgeon: Milus Banister, MD;  Location: [facility];  Service: Endoscopy;  Laterality: N/A;   ESOPHAGOGASTRODUODENOSCOPY (EGD) WITH PROPOFOL N/A 05/10/2021   Procedure: ESOPHAGOGASTRODUODENOSCOPY (EGD) WITH PROPOFOL;  Surgeon: Milus Banister, MD;  Location: [facility];  Service: Endoscopy;  Laterality: N/A;   HERNIA REPAIR     KNEE SURGERY Right    x2- cartilage annd scar tissue   LEFT AND RIGHT HEART CATHETERIZATION WITH CORONARY ANGIOGRAM N/A 08/17/2013   Procedure: LEFT AND RIGHT HEART CATHETERIZATION WITH CORONARY ANGIOGRAM;  Surgeon: Laverda Page, MD;  Location: [facility] LAB;  Service: Cardiovascular;  Laterality:  N/A;   lumbar back surgery  04/04/2018   done by neurosurgeron   right foot bone spurs removed     ROTATOR CUFF REPAIR Right    SHOULDER SURGERY Left    x2- rotatar cuff tear   UPPER GASTROINTESTINAL ENDOSCOPY     Current Outpatient Medications on File Prior to Visit  Medication Sig Dispense Refill   Accu-Chek Softclix Lancets lancets Please use to check blood sugar up to two times per day. E11.42 200 each 4   acetaminophen (TYLENOL) 500 MG tablet Take 1,000 mg by mouth every 6 (six) hours as needed for moderate pain or headache.     Ascorbic Acid (VITAMIN C) 1000 MG tablet Take 1,000 mg by mouth daily.     betamethasone dipropionate 0.05 % lotion Apply topically as directed. Apply to the scalp daily up to 5 days for scalp (Patient taking differently: Apply 1 application   topically daily as needed (scalp irritation). Apply to the scalp daily up to 5 days for scalp) 60 mL 2   Blood Glucose Monitoring Suppl (ACCU-CHEK GUIDE) w/Device KIT Please use to check blood sugar up to two times per day. E11.42 1 kit 0   Blood Glucose Monitoring Suppl (ONETOUCH VERIO) w/Device KIT Check blood glucose once a day 1 kit 0   chlorhexidine (PERIDEX) 0.12 % solution Use as directed 15 mLs in the mouth or throat 2 (two) times daily. (Patient taking differently: Use as directed 15 mLs in the mouth or throat 2 (two) times daily as needed (mouth sores).) 1893 mL 3   clobetasol (TEMOVATE) 0.05 % external solution Apply 1 application  topically daily. To scalp up to 5 times per week 50 mL 3   cromolyn (OPTICROM) 4 % ophthalmic solution Place 1 drop into both eyes 3 (three) times daily as needed (itchy eyes).     cyclobenzaprine (FLEXERIL) 10 MG tablet Take 1 tablet by mouth 3 (three) times daily as needed for muscle spasms.     Dulaglutide (TRULICITY) 3 LF/8.1OF SOPN INJECT '3MG'$  UNDER THE SKIN ONCE A WEEK Strength: 3 MG/0.5ML 6 mL 1   fluticasone (FLONASE) 50 MCG/ACT nasal spray Place 2 sprays into both nostrils daily as needed for allergies. 144 mL 1   fluticasone furoate-vilanterol (BREO ELLIPTA) 100-25 MCG/ACT AEPB TAKE 1 PUFF BY MOUTH EVERY DAY (Patient taking differently: Inhale 1 puff into the lungs daily. TAKE 1 PUFF BY MOUTH EVERY DAY) 180 each 1   gabapentin (NEURONTIN) 300 MG capsule TAKE 1 CAPSULE BY MOUTH EVERYDAY AT BEDTIME Strength: 300 mg (Patient taking differently: Take 300 mg by mouth at bedtime.) 90 capsule 3   glucose blood (ACCU-CHEK GUIDE) test strip Use to check blood sugar 2x per day. E11.42 200 strip 4   hydrOXYzine (ATARAX) 25 MG tablet TAKE 1-2 TABLETS AT BEDTIME AS NEEDED FOR ITCH. MAY MAKE DROWSY - DO NOT DRIVE AFTER TAKING 751 tablet 1   inFLIXimab (REMICADE) 100 MG injection Inject 100 mg into the vein every 7 (seven) weeks.      leflunomide (ARAVA) 10 MG tablet  Take 10 mg by mouth daily.     loperamide (IMODIUM) 2 MG capsule Take 2 mg by mouth daily as needed for diarrhea or loose stools.     metFORMIN (GLUCOPHAGE-XR) 500 MG 24 hr tablet TAKE 2 TABLETS BY MOUTH TWICE A DAY 360 tablet 1   mometasone (ELOCON) 0.1 % cream APPLY 1 APPLICATION TOPICALLY DAILY AS NEEDED (RASH). (Patient taking differently: Apply 1 Application topically daily as needed (  rash).) 45 g 3   montelukast (SINGULAIR) 10 MG tablet TAKE 1 TABLET BY MOUTH EVERY DAY (Patient taking differently: Take 10 mg by mouth at bedtime.) 30 tablet 0   mupirocin ointment (BACTROBAN) 2 % APPLY TOPICALLY ONCE DAILY TO OPEN SORES ON ARMS, LEGS AND TOES UNTIL HEALED. THEN APPLY TOPICALLY TO AFFECTED AREAS AS NEEDED FOR FLARES 22 g 0   MYRBETRIQ 25 MG TB24 tablet Take 50 mg by mouth daily.     nitroGLYCERIN (NITROSTAT) 0.4 MG SL tablet Place 1 tablet (0.4 mg total) under the tongue every 5 (five) minutes as needed. 25 tablet 1   omeprazole (PRILOSEC) 40 MG capsule Take 1 capsule (40 mg total) by mouth 2 (two) times daily. 180 capsule 0   oxyCODONE-acetaminophen (PERCOCET) 10-325 MG tablet Take 1 tablet by mouth every 6 (six) hours as needed for pain. 120 tablet 0   [START ON 07/16/2022] oxyCODONE-acetaminophen (PERCOCET) 10-325 MG tablet Take 1 tablet by mouth every 6 (six) hours as needed for pain. 120 tablet 0   [START ON 08/16/2022] oxyCODONE-acetaminophen (PERCOCET) 10-325 MG tablet Take 1 tablet by mouth every 6 (six) hours as needed for pain. 120 tablet 0   rosuvastatin (CRESTOR) 10 MG tablet Take 1 tablet (10 mg total) by mouth daily. 90 tablet 3   sertraline (ZOLOFT) 50 MG tablet Take 1 tablet (50 mg total) by mouth daily. 90 tablet 3   VENTOLIN HFA 108 (90 Base) MCG/ACT inhaler INHALE 2 PUFFS BY MOUTH EVERY 4 HOURS AS NEEDED FOR WHEEZING OR SHORTNESS OF BREATH 108 each 11   vitamin B-12 (CYANOCOBALAMIN) 1000 MCG tablet Take 1,000 mcg by mouth daily.     No current facility-administered medications on  file prior to visit.    Allergies  Allergen Reactions   Zofran [Ondansetron Hcl] Other (See Comments)    Prolong QT   Ace Inhibitors Cough    Occurred with lisinopril   Levofloxacin Other (See Comments)    Felt things on her legs that were not there,made her stomach hurt   Lisinopril Cough   Sulfonamide Derivatives Hives and Itching   Sulfa Antibiotics     Reaction:  Other reaction(s): ITCHING   Social History   Occupational History   Occupation: Retired/disabled    Fish farm manager: UNEMPLOYED  Tobacco Use   Smoking status: Former    Packs/day: 1.50    Years: 5.00    Total pack years: 7.50    Types: Cigarettes    Quit date: 05/31/1994    Years since quitting: 28.1    Passive exposure: Past   Smokeless tobacco: Never  Vaping Use   Vaping Use: Never used  Substance and Sexual Activity   Alcohol use: No    Alcohol/week: 0.0 standard drinks of alcohol   Drug use: No   Sexual activity: Not on file   Family History  Problem Relation Age of Onset   Stroke Father 78   Colon cancer Maternal Aunt    Tremor Mother    Lung cancer Brother 45   Rheum arthritis Sister 50   Colon cancer Daughter    COPD Sister    Cancer Sister        in nose   Diabetes Daughter    Hypertension Daughter    Healthy Daughter    Bell's palsy Maternal Grandmother    Esophageal cancer Neg Hx    Rectal cancer Neg Hx    Stomach cancer Neg Hx    Immunization History  Administered Date(s) Administered  COVID-19, mRNA, vaccine(Comirnaty)12 years and older 04/16/2022   Fluad Quad(high Dose 65+) 03/02/2020, 04/18/2021   Influenza Split 03/13/2012, 04/16/2022   Influenza Whole 06/12/2007, 04/05/2008, 05/02/2009, 04/06/2010   Influenza, High Dose Seasonal PF 03/13/2018, 02/18/2019   Influenza,inj,Quad PF,6+ Mos 03/16/2013, 03/02/2014, 04/12/2015, 04/02/2016, 03/26/2017   Influenza-Unspecified 03/30/2013, 04/03/2015, 03/01/2017, 03/13/2018, 02/27/2019   Meningococcal Polysaccharide 04/03/2015   PFIZER  Comirnaty(Gray Top)Covid-19 Tri-Sucrose Vaccine 10/19/2020   PFIZER(Purple Top)SARS-COV-2 Vaccination 08/14/2019, 09/08/2019, 03/02/2020   PPD Test 03/11/2012   Pfizer Covid-19 Vaccine Bivalent Booster 48yr & up 04/30/2021, 04/30/2021   Pneumococcal Conjugate-13 04/12/2015, 09/19/2016   Pneumococcal Polysaccharide-23 07/20/2015, 10/01/2017   Rsv, Bivalent, Protein Subunit Rsvpref,pf ([place] 04/16/2022   Td 01/03/2005   Tdap 10/01/2011   Unspecified SARS-COV-2 Vaccination 04/16/2022   Zoster Recombinat (Shingrix) 04/16/2022     Review of Systems: Negative except as noted in the HPI.   Objective: Vitals:   07/12/22 0758  BP: (!) 144/76    RDAPHYNE MIGUEZis a pleasant 72y.o. female in NAD. AAO X 3.  Vascular Examination: {jgvascular:23595}  Dermatological Examination: {jgderm:23598}  Neurological Examination: {jgneuro:23601::"Protective sensation intact 5/5 intact bilaterally with 10g monofilament b/l.","Vibratory sensation intact b/l.","Proprioception intact bilaterally."}  Musculoskeletal Examination: {jgmsk:23600}  Footwear Assessment: Does the patient wear appropriate shoes? {Yes,No}. Does the patient need inserts/orthotics? {Yes,No}.  ADA Risk Categorization: Low Risk :  Patient has all of the following: Intact protective sensation No prior foot ulcer  No severe deformity Pedal pulses present  High Risk  Patient has one or more of the following: Loss of protective sensation Absent pedal pulses Severe Foot deformity History of foot ulcer  Assessment: No diagnosis found.   Plan: No orders of the defined types were placed in this encounter.   No orders of the defined types were placed in this encounter.   None  {jgplan:23602::"-Patient/POA to call should there be question/concern in the interim."} Return in about 3 months (around 10/11/2022).  JMarzetta Board DPM

## 2022-07-16 ENCOUNTER — Ambulatory Visit (INDEPENDENT_AMBULATORY_CARE_PROVIDER_SITE_OTHER): Payer: 59 | Admitting: Student

## 2022-07-16 ENCOUNTER — Other Ambulatory Visit: Payer: Self-pay

## 2022-07-16 ENCOUNTER — Telehealth: Payer: Self-pay

## 2022-07-16 VITALS — BP 137/79 | HR 93 | Wt 124.6 lb

## 2022-07-16 DIAGNOSIS — N3000 Acute cystitis without hematuria: Secondary | ICD-10-CM

## 2022-07-16 DIAGNOSIS — I5042 Chronic combined systolic (congestive) and diastolic (congestive) heart failure: Secondary | ICD-10-CM

## 2022-07-16 DIAGNOSIS — R011 Cardiac murmur, unspecified: Secondary | ICD-10-CM | POA: Diagnosis not present

## 2022-07-16 DIAGNOSIS — E1142 Type 2 diabetes mellitus with diabetic polyneuropathy: Secondary | ICD-10-CM

## 2022-07-16 LAB — POCT URINALYSIS DIP (MANUAL ENTRY)
Bilirubin, UA: NEGATIVE
Glucose, UA: NEGATIVE mg/dL
Ketones, POC UA: NEGATIVE mg/dL
Nitrite, UA: NEGATIVE
Protein Ur, POC: 100 mg/dL — AB
Spec Grav, UA: 1.025 (ref 1.010–1.025)
Urobilinogen, UA: 0.2 E.U./dL
pH, UA: 6 (ref 5.0–8.0)

## 2022-07-16 MED ORDER — CEPHALEXIN 500 MG PO CAPS: 500.0000 mg | ORAL_CAPSULE | Freq: Two times a day (BID) | ORAL | 0 refills | Status: DC

## 2022-07-16 MED ORDER — GLUCOSE BLOOD VI STRP: ORAL_STRIP | 12 refills | Status: DC

## 2022-07-16 MED ORDER — FLUCONAZOLE 150 MG PO TABS: 150.0000 mg | ORAL_TABLET | Freq: Once | ORAL | 0 refills | Status: AC

## 2022-07-16 MED ORDER — ONETOUCH ULTRA 2 W/DEVICE KIT: 1.0000 | PACK | Freq: Once | 0 refills | Status: AC

## 2022-07-16 NOTE — Telephone Encounter (Signed)
A Prior Authorization was initiated for this patients OXYCODONE/ACETAMINOPHEN through CoverMyMeds.   Key: [id number]

## 2022-07-16 NOTE — Progress Notes (Signed)
    SUBJECTIVE:   CHIEF COMPLAINT / HPI:   UTI Suspects UTI: urgency, frequency, no pain or fevers. No dysuria. History of the same though not terribly frequently (Last in October of 2022). Hx of growing pan-sensitive E coli per chart review. However, does have RA and gets infliximab injections q7 weeks.   DM2 Requesting Rx for a OneTouch Ultra device. Has been using Accucheck but does not like the device. Tried her daughter's OneTouch and much preferred the interface. Explained that insurance may or may not cover this, but she is willing to entertain cash-pay prices at the pharmacy.   Murmur 2/6 systolic murmur incidental finding on exam today. Per chart review has a hx of an echo in 2013 with mid-range EF but then had normal stress test. Trace mitral, tricuspid, and pulmonic regurgitation at that time. Saw Dr. Einar Gip briefly but lost to cardiology follow-up. No swelling/DOE/CP.   OBJECTIVE:   BP 137/79   Pulse 93   Wt 124 lb 9.6 oz (56.5 kg)   SpO2 99%   BMI 22.79 kg/m   Physical Exam Vitals reviewed.  Constitutional:      General: She is not in acute distress. Cardiovascular:     Pulses: Normal pulses.     Comments: Regular rate and rhythm, 2/6 systolic murmur Pulmonary:     Effort: Pulmonary effort is normal. No respiratory distress.     Breath sounds: No wheezing or rales.  Abdominal:     Comments: Mild suprapublic tenderness. No CVA tenderness on left or right.   Musculoskeletal:        General: No swelling.  Skin:    Capillary Refill: Capillary refill takes less than 2 seconds.  Neurological:     General: No focal deficit present.     Mental Status: She is alert.      ASSESSMENT/PLAN:   Acute cystitis without hematuria UA significant just for small leukocytes, but story is convincing for UTI. Hx of pan-sensitive E coli. Will treat with BID Keflex x1 week. Given age and immunocompromised status on infliximab, will culture urine and follow-up as needed. Will send  in Diflucan in case of yeast infection symptoms s/p abx.  - Keflex '500mg'$  BID x7 days - Urine culture - Diflucan '150mg'$     Murmur, cardiac New finding per chart review. Given abnormal echo 10 years ago, will repeat echo. Likely needs to get plugged back in with cardiology. - Echocardiogram complete  Type II diabetes mellitus ([place]) Will order one touch device per patient preference. However, expect that this may be cost-prohibitive.      [name], MD [place]

## 2022-07-16 NOTE — Patient Instructions (Signed)
Michelle [name],  It is great to meet you! I'm sorry about your UTI symptoms. Let's treat you with an antibiotic called Keflex which you will take twice daily for a week. I am also sending in some diflucan for you to have on hand as-needed if you develop yeast infection. We will culture your urine to make sure that we are treating the right bacteria.  We will get an ultrasound of your heart tomorrow at 8am at [facility].   Be well,  [name], MD

## 2022-07-16 NOTE — Assessment & Plan Note (Signed)
Will order one touch device per patient preference. However, expect that this may be cost-prohibitive.

## 2022-07-16 NOTE — Assessment & Plan Note (Signed)
UA significant just for small leukocytes, but story is convincing for UTI. Hx of pan-sensitive E coli. Will treat with BID Keflex x1 week. Given age and immunocompromised status on infliximab, will culture urine and follow-up as needed. Will send in Diflucan in case of yeast infection symptoms s/p abx.  - Keflex '500mg'$  BID x7 days - Urine culture - Diflucan '150mg'$ 

## 2022-07-16 NOTE — Assessment & Plan Note (Signed)
New finding per chart review. Given abnormal echo 10 years ago, will repeat echo. Likely needs to get plugged back in with cardiology. - Echocardiogram complete

## 2022-07-17 ENCOUNTER — Ambulatory Visit (HOSPITAL_COMMUNITY)
Admission: RE | Admit: 2022-07-17 | Discharge: 2022-07-17 | Disposition: A | Discharge status: Home / Self Care | Payer: 59 | Source: Ambulatory Visit | Attending: Family Medicine | Admitting: Family Medicine

## 2022-07-17 ENCOUNTER — Other Ambulatory Visit: Payer: Self-pay | Admitting: Family Medicine

## 2022-07-17 DIAGNOSIS — E785 Hyperlipidemia, unspecified: Secondary | ICD-10-CM | POA: Diagnosis not present

## 2022-07-17 DIAGNOSIS — I10 Essential (primary) hypertension: Secondary | ICD-10-CM | POA: Insufficient documentation

## 2022-07-17 DIAGNOSIS — R011 Cardiac murmur, unspecified: Secondary | ICD-10-CM | POA: Insufficient documentation

## 2022-07-17 DIAGNOSIS — K219 Gastro-esophageal reflux disease without esophagitis: Secondary | ICD-10-CM | POA: Insufficient documentation

## 2022-07-17 DIAGNOSIS — E119 Type 2 diabetes mellitus without complications: Secondary | ICD-10-CM | POA: Insufficient documentation

## 2022-07-17 DIAGNOSIS — E1142 Type 2 diabetes mellitus with diabetic polyneuropathy: Secondary | ICD-10-CM

## 2022-07-17 LAB — ECHOCARDIOGRAM COMPLETE
Area-P 1/2: 3.27 cm2
Calc EF: 40.5 %
S' Lateral: 3.5 cm
Single Plane A2C EF: 45.1 %
Single Plane A4C EF: 35.1 %

## 2022-07-17 MED ORDER — GLUCOSE BLOOD VI STRP: ORAL_STRIP | 12 refills | Status: DC

## 2022-07-17 NOTE — Addendum Note (Signed)
Addended by: Jim Like B on: 07/17/2022 01:35 PM   Modules accepted: Orders

## 2022-07-17 NOTE — Telephone Encounter (Signed)
Prior Auth for patients medication OXYCOD/APAP TAB 10-'325MG'$  approved by [facility] MEDICARE from 07/16/22 to 08/15/22.  Key: [id number]

## 2022-07-17 NOTE — Progress Notes (Signed)
Echocardiogram 2D Echocardiogram has been performed.  Michelle [name] 07/17/2022, 8:52 AM

## 2022-07-19 ENCOUNTER — Ambulatory Visit (INDEPENDENT_AMBULATORY_CARE_PROVIDER_SITE_OTHER): Payer: 59 | Admitting: Family Medicine

## 2022-07-19 ENCOUNTER — Encounter: Payer: Self-pay | Admitting: Family Medicine

## 2022-07-19 VITALS — BP 123/72 | HR 98 | Wt 123.5 lb

## 2022-07-19 DIAGNOSIS — E1142 Type 2 diabetes mellitus with diabetic polyneuropathy: Secondary | ICD-10-CM | POA: Diagnosis not present

## 2022-07-19 DIAGNOSIS — I5020 Unspecified systolic (congestive) heart failure: Secondary | ICD-10-CM | POA: Diagnosis not present

## 2022-07-19 MED ORDER — DULAGLUTIDE 0.75 MG/0.5ML ~~LOC~~ SOAJ: 0.7500 mg | SUBCUTANEOUS | 0 refills | Status: DC

## 2022-07-19 MED ORDER — GLUCOSE BLOOD VI STRP: ORAL_STRIP | 12 refills | Status: DC

## 2022-07-19 MED ORDER — EMPAGLIFLOZIN 10 MG PO TABS: 10.0000 mg | ORAL_TABLET | Freq: Every day | ORAL | 0 refills | Status: DC

## 2022-07-19 MED ORDER — METOPROLOL SUCCINATE ER 25 MG PO TB24: 25.0000 mg | ORAL_TABLET | Freq: Every day | ORAL | 0 refills | Status: DC

## 2022-07-19 NOTE — Assessment & Plan Note (Addendum)
New diagnosis EF 35 to 40% on recent TTE, patient appears to be well compensated no signs of hypervolemia. Etiology is unclear. Does have some PND but seems to be more related to chronic asthma.  Diffuse rales on exam has been chronic.  She has upcoming follow-up with cardiology but will go ahead and initiate GDMT. - start metoprolol succinate 25 mg - start empagliflozin 10 mg (comorbid T2DM) - defer loop diuretic as she seems euvolemic - additional workup per cardiology

## 2022-07-19 NOTE — Assessment & Plan Note (Signed)
Has been well-controlled. - resume dulaglutide at starting dose 0.75 mg weekly per patient preference - continue metformin - start empagliflozin as above

## 2022-07-19 NOTE — Progress Notes (Signed)
    SUBJECTIVE:   CHIEF COMPLAINT / HPI:  Chief Complaint  Patient presents with   Follow-up    Heart doctor    At recent visit with Dr. Carmina Miller, she had an echocardiogram ordered due to systolic murmur heard on exam noted to have EF 35 to 40% with global hypokinesis of the left ventricle and grade 1 diastolic dysfunction.  Today, patient is still suffering from ongoing fatigue which has been present for several months.  She denies any leg swelling.  She has had some paroxysmal nocturnal dyspnea but reports that her symptoms resolve after taking her inhalers.  She is feeling generally weak and her physical activity is limited by fatigue, denies any dyspnea with exertion.  She has not seen cardiology since 2013 but does have an upcoming appointment next week.  She wants to resume her Trulicity which we had held previously due to weight loss and decreased appetite.  PERTINENT  PMH / PSH: HFrEF, asthma, T2DM, RA, chronic pain, HLD, GAD  Patient Care Team: [name], MD as PCP - General (Family Medicine) Alda Berthold, DO as Consulting Physician (Neurology)   OBJECTIVE:   BP 123/72   Pulse 98   Wt 123 lb 8 oz (56 kg)   SpO2 96%   BMI 22.59 kg/m   Physical Exam Constitutional:      General: She is not in acute distress. HENT:     Head: Normocephalic and atraumatic.  Cardiovascular:     Rate and Rhythm: Normal rate and regular rhythm.  Pulmonary:     Effort: Pulmonary effort is normal. No respiratory distress.     Breath sounds: Rales present.     Comments: Faint diffuse rales appreciated throughout all lung fields Musculoskeletal:     Cervical back: Neck supple.     Right lower leg: No edema.     Left lower leg: No edema.  Neurological:     Mental Status: She is alert.         07/19/2022   11:42 AM  Depression screen PHQ 2/9  Decreased Interest 0  Down, Depressed, Hopeless 0  PHQ - 2 Score 0  Altered sleeping 0  Tired, decreased energy 0  Change in appetite  0  Feeling bad or failure about yourself  0  Trouble concentrating 0  Moving slowly or fidgety/restless 0  Suicidal thoughts 0  PHQ-9 Score 0     {Show previous vital signs (optional):23777}    ASSESSMENT/PLAN:   HFrEF (heart failure with reduced ejection fraction) (HCC) New diagnosis EF 35 to 40% on recent TTE, patient appears to be well compensated no signs of hypervolemia. Etiology is unclear. Does have some PND but seems to be more related to chronic asthma.  Diffuse rales on exam has been chronic.  She has upcoming follow-up with cardiology but will go ahead and initiate GDMT. - start metoprolol succinate 25 mg - start empagliflozin 10 mg (comorbid T2DM) - defer loop diuretic as she seems euvolemic - additional workup per cardiology  Type II diabetes mellitus ([place]) Has been well-controlled. - resume dulaglutide at starting dose 0.75 mg weekly per patient preference - continue metformin - start empagliflozin as above    Return in 5 weeks (on 08/26/2022) for f/u diabetes.   [name], MD [facility] [facility]

## 2022-07-19 NOTE — Patient Instructions (Addendum)
It was nice seeing you today!  Restart Trulicity 8.54 mg once a week.  Start new medications once a day: metoprolol succinate, Jardiance.  Stay well, [name], MD [facility] [phone]  --  Make sure to check out at the front desk before you leave today.  Please arrive at least 15 minutes prior to your scheduled appointments.  If you had blood work today, I will send you a MyChart message or a letter if results are normal. Otherwise, I will give you a call.  If you had a referral placed, they will call you to set up an appointment. Please give [place] a call if you don't hear back in the next 2 weeks.  If you need additional refills before your next appointment, please call your pharmacy first.

## 2022-07-22 ENCOUNTER — Ambulatory Visit: Payer: 59 | Admitting: Cardiology

## 2022-07-22 ENCOUNTER — Encounter: Payer: Self-pay | Admitting: Cardiology

## 2022-07-22 VITALS — BP 148/81 | HR 92 | Resp 16 | Ht 62.0 in | Wt 124.0 lb

## 2022-07-22 DIAGNOSIS — I2510 Atherosclerotic heart disease of native coronary artery without angina pectoris: Secondary | ICD-10-CM

## 2022-07-22 DIAGNOSIS — E119 Type 2 diabetes mellitus without complications: Secondary | ICD-10-CM | POA: Diagnosis not present

## 2022-07-22 DIAGNOSIS — E1169 Type 2 diabetes mellitus with other specified complication: Secondary | ICD-10-CM | POA: Diagnosis not present

## 2022-07-22 DIAGNOSIS — Z87891 Personal history of nicotine dependence: Secondary | ICD-10-CM

## 2022-07-22 DIAGNOSIS — I5022 Chronic systolic (congestive) heart failure: Secondary | ICD-10-CM | POA: Diagnosis not present

## 2022-07-22 DIAGNOSIS — I10 Essential (primary) hypertension: Secondary | ICD-10-CM | POA: Diagnosis not present

## 2022-07-22 DIAGNOSIS — I429 Cardiomyopathy, unspecified: Secondary | ICD-10-CM

## 2022-07-22 DIAGNOSIS — M069 Rheumatoid arthritis, unspecified: Secondary | ICD-10-CM | POA: Diagnosis not present

## 2022-07-22 DIAGNOSIS — I700 Atherosclerosis of aorta: Secondary | ICD-10-CM

## 2022-07-22 DIAGNOSIS — E785 Hyperlipidemia, unspecified: Secondary | ICD-10-CM | POA: Diagnosis not present

## 2022-07-22 MED ORDER — ENTRESTO 24-26 MG PO TABS: 1.0000 | ORAL_TABLET | Freq: Two times a day (BID) | ORAL | 0 refills | Status: DC

## 2022-07-22 MED ORDER — ASPIRIN 81 MG PO TBEC: 81.0000 mg | DELAYED_RELEASE_TABLET | Freq: Every day | ORAL | 0 refills | Status: AC

## 2022-07-22 NOTE — Progress Notes (Signed)
ID:  NAZ DENUNZIO, DOB [birth year], MRN [id number]  PCP:  [name], MD  Cardiologist:  Rex Kras, DO, [facility] (established care 07/22/2022) Former Cardiology Providers: Dr. Einar Gip  REASON FOR CONSULT: Chronic HFrEF  REQUESTING PHYSICIAN:  [name], MD [address] [place] [id number]  Chief Complaint  Patient presents with   chronic heart failure   New Patient (Initial Visit)    HPI  Michelle [name] is a 72 y.o. Caucasian female who presents to the clinic for evaluation of HFrEF at the request of [name], MD. Her past medical history and cardiovascular risk factors include: Cardiomyopathy/chronic HFrEF, coronary artery calcification (CT scan care everywhere), aortic atherosclerosis, former smoker, non-insulin-dependent diabetes mellitus type 2, hyperlipidemia, hypertension.   Patient presents today for evaluation of HFrEF.  She was recently seen by PCP and was noted to have a cardiac murmur and echocardiogram was performed which notes moderately reduced LVEF with global hypokinesis and grade 1 diastolic impairment.  Her medications were uptitrated and referred to cardiology for further evaluation and management.  Patient denies anginal discomfort, orthopnea, PND, or lower extremity swelling.  She feels tired fatigued joint pain has been ongoing since her third surgery in August 2023.  At the last office visit she was prescribed Jardiance as well as metoprolol.  She has not taken Jardiance given her history of yeast infections.  She is currently battling yeast infection as we speak.  Denies any use of alcohol.  No recent viral infections.  FUNCTIONAL STATUS: No structured exercise program or daily routine.   ALLERGIES: Allergies  Allergen Reactions   Zofran [Ondansetron Hcl] Other (See Comments)    Prolong QT   Ace Inhibitors Cough    Occurred with lisinopril   Levofloxacin Other (See Comments)    Felt things on her legs that were not there,made her  stomach hurt   Lisinopril Cough   Sulfonamide Derivatives Hives and Itching   Sulfa Antibiotics     Reaction:  Other reaction(s): ITCHING    MEDICATION LIST PRIOR TO VISIT: Current Meds  Medication Sig   Accu-Chek Softclix Lancets lancets Please use to check blood sugar up to two times per day. E11.42   acetaminophen (TYLENOL) 500 MG tablet Take 1,000 mg by mouth every 6 (six) hours as needed for moderate pain or headache.   Ascorbic Acid (VITAMIN C) 1000 MG tablet Take 1,000 mg by mouth daily.   aspirin EC 81 MG tablet Take 1 tablet (81 mg total) by mouth daily. Swallow whole.   betamethasone dipropionate 0.05 % lotion Apply topically as directed. Apply to the scalp daily up to 5 days for scalp (Patient taking differently: Apply 1 application  topically daily as needed (scalp irritation). Apply to the scalp daily up to 5 days for scalp)   Blood Glucose Monitoring Suppl (ACCU-CHEK GUIDE) w/Device KIT Please use to check blood sugar up to two times per day. E11.42   cephALEXin (KEFLEX) 500 MG capsule Take 1 capsule (500 mg total) by mouth 2 (two) times daily.   chlorhexidine (PERIDEX) 0.12 % solution Use as directed 15 mLs in the mouth or throat 2 (two) times daily. (Patient taking differently: Use as directed 15 mLs in the mouth or throat 2 (two) times daily as needed (mouth sores).)   clobetasol (TEMOVATE) 0.05 % external solution Apply 1 application  topically daily. To scalp up to 5 times per week   cromolyn (OPTICROM) 4 % ophthalmic solution Place 1 drop into both eyes  3 (three) times daily as needed (itchy eyes).   cyclobenzaprine (FLEXERIL) 10 MG tablet Take 1 tablet by mouth 3 (three) times daily as needed for muscle spasms.   Dulaglutide 0.75 MG/0.5ML SOPN Inject 0.75 mg into the skin once a week.   fluticasone (FLONASE) 50 MCG/ACT nasal spray Place 2 sprays into both nostrils daily as needed for allergies.   fluticasone furoate-vilanterol (BREO ELLIPTA) 100-25 MCG/ACT AEPB TAKE 1 PUFF  BY MOUTH EVERY DAY (Patient taking differently: Inhale 1 puff into the lungs daily. TAKE 1 PUFF BY MOUTH EVERY DAY)   gabapentin (NEURONTIN) 300 MG capsule TAKE 1 CAPSULE BY MOUTH EVERYDAY AT BEDTIME Strength: 300 mg (Patient taking differently: Take 300 mg by mouth at bedtime.)   glucose blood test strip Use to check blood glucose 2 times a day as needed   glucose blood test strip Use as instructed a few times a week as needed   hydrOXYzine (ATARAX) 25 MG tablet TAKE 1-2 TABLETS AT BEDTIME AS NEEDED FOR ITCH. MAY MAKE DROWSY - DO NOT DRIVE AFTER TAKING   inFLIXimab (REMICADE) 100 MG injection Inject 100 mg into the vein every 7 (seven) weeks.    leflunomide (ARAVA) 10 MG tablet Take 10 mg by mouth daily.   loperamide (IMODIUM) 2 MG capsule Take 2 mg by mouth daily as needed for diarrhea or loose stools.   metFORMIN (GLUCOPHAGE-XR) 500 MG 24 hr tablet TAKE 2 TABLETS BY MOUTH TWICE A DAY   metoprolol succinate (TOPROL-XL) 25 MG 24 hr tablet Take 1 tablet (25 mg total) by mouth at bedtime.   mometasone (ELOCON) 0.1 % cream APPLY 1 APPLICATION TOPICALLY DAILY AS NEEDED (RASH). (Patient taking differently: Apply 1 Application topically daily as needed (rash).)   montelukast (SINGULAIR) 10 MG tablet TAKE 1 TABLET BY MOUTH EVERY DAY (Patient taking differently: Take 10 mg by mouth at bedtime.)   mupirocin ointment (BACTROBAN) 2 % APPLY TOPICALLY ONCE DAILY TO OPEN SORES ON ARMS, LEGS AND TOES UNTIL HEALED. THEN APPLY TOPICALLY TO AFFECTED AREAS AS NEEDED FOR FLARES   MYRBETRIQ 25 MG TB24 tablet Take 50 mg by mouth daily.   nitroGLYCERIN (NITROSTAT) 0.4 MG SL tablet Place 1 tablet (0.4 mg total) under the tongue every 5 (five) minutes as needed.   omeprazole (PRILOSEC) 40 MG capsule Take 1 capsule (40 mg total) by mouth 2 (two) times daily.   oxyCODONE-acetaminophen (PERCOCET) 10-325 MG tablet Take 1 tablet by mouth every 6 (six) hours as needed for pain.   rosuvastatin (CRESTOR) 10 MG tablet Take 1 tablet  (10 mg total) by mouth daily.   sacubitril-valsartan (ENTRESTO) 24-26 MG Take 1 tablet by mouth 2 (two) times daily.   sertraline (ZOLOFT) 50 MG tablet Take 1 tablet (50 mg total) by mouth daily.   VENTOLIN HFA 108 (90 Base) MCG/ACT inhaler INHALE 2 PUFFS BY MOUTH EVERY 4 HOURS AS NEEDED FOR WHEEZING OR SHORTNESS OF BREATH   vitamin B-12 (CYANOCOBALAMIN) 1000 MCG tablet Take 1,000 mcg by mouth daily.     PAST MEDICAL HISTORY: Past Medical History:  Diagnosis Date   Actinic keratosis    Allergic rhinitis 08/28/2006   Anemia    low iron   Anginal pain ([place])    Asthma, chronic 03/14/2010   Does seem to use her albuterol excessively. Consider change to more manageable medicines and treat more like COPD in the future.   Carpal tunnel syndrome, left 05/29/2009   Last Assessment & Plan:  Advised to wear wrist brace at night  when she is sleeping.  If this does not help will consider having her go to sports medicine.   Chronic frontal sinusitis 08/28/2016   Chronic left maxillary sinusitis 08/28/2016   Diverticulosis    Essential hypertension 10/30/2012   Gastroparesis 11/19/2011   DM and chronic narcotics contribute.  Causes dysphagia like symptoms    Generalized anxiety disorder 08/28/2006   GERD (gastroesophageal reflux disease)    Hearing loss 07/19/2015   Hearing aid in right ear   History of cataracts, bilateral    Hyperlipidemia 08/28/2006   Lumbar radiculopathy 04/07/2018   Lung nodules    Osteoporosis 08/08/2011   Pain in joint, pelvic region and thigh 11/18/2012   Radial styloid tenosynovitis 08/15/2013   Restless leg syndrome 05/14/2012   Seropositive rheumatoid arthritis 01/08/2010   Patient is being followed by [facility]ologist at [facility] patient is on Plaquenil and as well as prednisone.   Changed to methotrexate and prednisone in May of 2013  Patient does have a pain contract here with [facility] receives oxycodone 10/325 mg tabs every 6  hours. This has been titrated down from significant doses of methadone previously. Could attempt to tit   Shoulder joint pain 03/25/2011   The patient is compensating due to her not being able to use her left arm right now rotator cuff impingement if he continues to worsen would not be surprised if she does get a tear on this side as well   Squamous cell carcinoma in situ of skin 11/25/2014   Referred to dermatology for excision    Squamous cell carcinoma of skin 12/26/2014   Right distal pretibial. SCC-KA pattern.   Squamous cell carcinoma of skin 03/27/2015   Right inf. lat. knee. SCC-KA pattern.    Squamous cell carcinoma of skin 10/07/2016   Left lateral calf superior. WD SCC.   Squamous cell carcinoma of skin 10/07/2016   Left lateral calf inferior. WD SCC.   Squamous cell carcinoma of skin 04/28/2018   Left mid med. pretibial. WD SCC.   Squamous cell carcinoma of skin 08/12/2019   Left mid med. pretibial sup. KA type. EDC   Squamous cell carcinoma of skin 08/12/2019   Left mid med. pretibial inferior. WD. EDC.   Tear of medial meniscus of knee, left 04/27/2009   Type II diabetes mellitus 08/28/2006   Overview:  Patient has her ups and downs, go in line with flares of her RA and prednisone uses.  Patient does have hx of hypoglycemic events so would not try for perfect control but should have A1c goal around 7.0 Lab Results  Component Value Date   HGBA1C 6.9 12/27/2011   Last Assessment & Plan:  At goal. Will continue current metformin    PAST SURGICAL HISTORY: Past Surgical History:  Procedure Laterality Date   ABDOMINAL HYSTERECTOMY     APPENDECTOMY     BACK SURGERY     BIOPSY  05/10/2021   Procedure: BIOPSY;  Surgeon: Milus Banister, MD;  Location: [facility] ENDOSCOPY;  Service: Endoscopy;;  EGD and COLON   CARPAL TUNNEL RELEASE Right    CATARACT EXTRACTION Left    with lid lift   CHOLECYSTECTOMY     COLONOSCOPY     COLONOSCOPY WITH PROPOFOL N/A 05/10/2021   Procedure:  COLONOSCOPY WITH PROPOFOL;  Surgeon: Milus Banister, MD;  Location: [facility] ENDOSCOPY;  Service: Endoscopy;  Laterality: N/A;   ESOPHAGOGASTRODUODENOSCOPY (EGD) WITH PROPOFOL N/A 05/10/2021   Procedure: ESOPHAGOGASTRODUODENOSCOPY (EGD) WITH PROPOFOL;  Surgeon: Milus Banister, MD;  Location: [facility];  Service: Endoscopy;  Laterality: N/A;   HERNIA REPAIR     KNEE SURGERY Right    x2- cartilage annd scar tissue   LEFT AND RIGHT HEART CATHETERIZATION WITH CORONARY ANGIOGRAM N/A 08/17/2013   Procedure: LEFT AND RIGHT HEART CATHETERIZATION WITH CORONARY ANGIOGRAM;  Surgeon: Laverda Page, MD;  Location: [facility] CATH LAB;  Service: Cardiovascular;  Laterality: N/A;   lumbar back surgery  04/04/2018   done by neurosurgeron   right foot bone spurs removed     ROTATOR CUFF REPAIR Right    SHOULDER SURGERY Left    x2- rotatar cuff tear   UPPER GASTROINTESTINAL ENDOSCOPY      FAMILY HISTORY: The patient family history includes Bell's palsy in her maternal grandmother; COPD in her sister; Cancer in her sister; Colon cancer in her daughter and maternal aunt; Diabetes in her daughter; Healthy in her daughter; Hypertension in her daughter; Lung cancer (age of onset: 31) in her brother; Rheum arthritis (age of onset: 81) in her sister; Stroke (age of onset: 61) in her father; Tremor in her mother.  SOCIAL HISTORY:  The patient  reports that she quit smoking about 28 years ago. Her smoking use included cigarettes. She has a 7.50 pack-year smoking history. She has been exposed to tobacco smoke. She has never used smokeless tobacco. She reports that she does not drink alcohol and does not use drugs.  REVIEW OF SYSTEMS: Review of Systems  Constitutional: Positive for malaise/fatigue.  Cardiovascular:  Negative for chest pain, claudication, dyspnea on exertion, irregular heartbeat, leg swelling, near-syncope, orthopnea, palpitations, paroxysmal nocturnal dyspnea and syncope.  Respiratory:  Negative for  shortness of breath.   Hematologic/Lymphatic: Negative for bleeding problem.  Musculoskeletal:  Positive for back pain and joint pain. Negative for muscle cramps and myalgias.  Neurological:  Negative for dizziness and light-headedness.    PHYSICAL EXAM:    07/22/2022    9:10 AM 07/19/2022   11:44 AM 07/16/2022   10:11 AM  Vitals with BMI  Height '5\' 2"'$     Weight 124 lbs 123 lbs 8 oz 124 lbs 10 oz  BMI 22.67 47.82 95.62  Systolic 130 865 784  Diastolic 81 72 79  Pulse 92 98 93    Physical Exam  Constitutional: No distress.  Age appropriate, hemodynamically stable.   HENT:  Poor oral hygiene   Neck: No JVD present.  Cardiovascular: Normal rate, regular rhythm, S1 normal, S2 normal, intact distal pulses and normal pulses. Exam reveals no gallop, no S3 and no S4.  No murmur heard. Pulmonary/Chest: Effort normal and breath sounds normal. No stridor. She has no wheezes. She has no rales.  Abdominal: Soft. Bowel sounds are normal. She exhibits no distension. There is no abdominal tenderness.  Musculoskeletal:        General: No edema.     Cervical back: Neck supple.  Neurological: She is alert and oriented to person, place, and time. She has intact cranial nerves (2-12).  Skin: Skin is warm and moist.   CARDIAC DATABASE: EKG: 07/22/2022: Sinus rhythm, 97 bpm, normal axis, without underlying injury pattern.  Echocardiogram: 07/17/2022: LVEF 35-40%, global hypokinesis, grade 1 diastolic impairment, mild MR, estimated RAP 3 mmHg.   Stress Testing: No results found for this or any previous visit from the past 1095 days.   Heart Catheterization: Feb 2015: no records available for review  ABI 12/04/2021 Right: Resting right ankle-brachial index is within normal range. No evidence  of significant right lower extremity arterial disease. The right toe-brachial index is normal.   Left: Resting left ankle-brachial index is within normal range. No evidence of significant left lower  extremity arterial disease. The left toe-brachial index is normal.   LABORATORY DATA:    Latest Ref Rng & Units 07/22/2022   10:54 AM 02/07/2022    9:37 AM 02/28/2021    9:16 AM  CBC  WBC 3.4 - 10.8 x10E3/uL 8.7  9.2  8.7   Hemoglobin 11.1 - 15.9 g/dL 10.6  11.8  12.0   Hematocrit 34.0 - 46.6 % 34.8  36.7  37.7   Platelets 150 - 450 x10E3/uL 314  245  259        Latest Ref Rng & Units 07/22/2022   10:54 AM 02/07/2022    9:37 AM 08/31/2020   10:33 AM  CMP  Glucose 70 - 99 mg/dL 132  122  180   BUN 8 - 27 mg/dL '15  10  15   '$ Creatinine 0.57 - 1.00 mg/dL 0.46  0.42  0.70   Sodium 134 - 144 mmol/L 139  137  138   Potassium 3.5 - 5.2 mmol/L 4.2  4.1  4.1   Chloride 96 - 106 mmol/L 102  104  101   CO2 20 - 29 mmol/L '20  26  19   '$ Calcium 8.7 - 10.3 mg/dL 9.2  9.5  9.6   Total Protein 6.0 - 8.5 g/dL 6.5   6.8   Total Bilirubin 0.0 - 1.2 mg/dL <0.2   0.2   Alkaline Phos 44 - 121 IU/L 101   109   AST 0 - 40 IU/L 23   21   ALT 0 - 32 IU/L 12   16     Lipid Panel  Lab Results  Component Value Date   CHOL 110 02/28/2021   HDL 42 02/28/2021   LDLCALC 49 02/28/2021   LDLDIRECT 43 06/01/2020   TRIG 100 02/28/2021   CHOLHDL 2.6 02/28/2021     No components found for: "NTPROBNP" Recent Labs    07/22/22 1054  PROBNP 80   No results for input(s): "TSH" in the last 8760 hours.  BMP Recent Labs    02/07/22 0937 07/22/22 1054  NA 137 139  K 4.1 4.2  CL 104 102  CO2 26 20  GLUCOSE 122* 132*  BUN 10 15  CREATININE 0.42* 0.46*  CALCIUM 9.5 9.2  GFRNONAA >60  --     HEMOGLOBIN A1C Lab Results  Component Value Date   HGBA1C 6.1 (H) 05/28/2022   MPG 142.72 02/07/2022    IMPRESSION:    ICD-10-CM   1. Chronic HFrEF (heart failure with reduced ejection fraction) (HCC)  I50.22 EKG 12-Lead    Pro b natriuretic peptide (BNP)    CMP14+EGFR    Magnesium    CBC    sacubitril-valsartan (ENTRESTO) 24-26 MG    2. Cardiomyopathy, unspecified type ([place])  I42.9     3. Coronary  artery calcification seen on computed tomography  I25.10 aspirin EC 81 MG tablet    4. Aortic atherosclerosis (HCC)  I70.0 aspirin EC 81 MG tablet    5. Non-insulin dependent type 2 diabetes mellitus ([place])  E11.9     6. Type 2 diabetes mellitus with hyperlipidemia (HCC)  E11.69    E78.5     7. Benign hypertension  I10     8. Former smoker  Z87.891     71. Rheumatoid arthritis, involving unspecified site,  unspecified whether rheumatoid factor present ([place])  M06.9        RECOMMENDATIONS: Michelle [name] is a 73 y.o. Caucasian female whose past medical history and cardiac risk factors include: Coronary artery calcification, aortic atherosclerosis, former smoker, non-insulin-dependent diabetes mellitus type 2, hyperlipidemia, hypertension.   Chronic HFrEF (heart failure with reduced ejection fraction) (HCC) Cardiomyopathy, unspecified type ([place]) Newly discovered. Denies anginal discomfort or heart failure symptoms. Euvolemic on physical examination. Discussed undergoing left and right heart catheterization to rule out obstructive CAD and to evaluate hemodynamics given the newly discovered cardiomyopathy and multiple cardiovascular risk factors including diabetes and former smoker.  Discussed the risks, benefits, and alternatives to left and right heart catheterization.  Patient verbalizes understanding and wishes to proceed forward. Was started on Jardiance by PCP but stopped taking it due to history of yeast infections. Will start Entresto 24/26 mg p.o. twice daily Will order baseline labs for her heart catheterization will need repeat BMP a week later after starting Entresto. Currently on aspirin, Toprol-XL, rosuvastatin.  Coronary artery calcification seen on computed tomography Aortic atherosclerosis (HCC) Continue aspirin and statin therapy  Non-insulin dependent type 2 diabetes mellitus ([place]) Reemphasized importance of glycemic control. Currently on statin therapy, will start  ARNI.  Unable to do SGLT2 inhibitors due to a prior history of yeast infections.  Type 2 diabetes mellitus with hyperlipidemia ([place]) Recommend a goal LDL of <55 mg/dL. Lipids currently being managed by PCP  Benign hypertension Office blood pressures are not well-controlled. Medication changes as discussed above. Advised to keep a log of her blood pressure stable.  FINAL MEDICATION LIST END OF ENCOUNTER: Meds ordered this encounter  Medications   sacubitril-valsartan (ENTRESTO) 24-26 MG    Sig: Take 1 tablet by mouth 2 (two) times daily.    Dispense:  60 tablet    Refill:  0   aspirin EC 81 MG tablet    Sig: Take 1 tablet (81 mg total) by mouth daily. Swallow whole.    Dispense:  90 tablet    Refill:  0    Medications Discontinued During This Encounter  Medication Reason   oxyCODONE-acetaminophen (PERCOCET) 10-325 MG tablet    oxyCODONE-acetaminophen (PERCOCET) 10-325 MG tablet      Current Outpatient Medications:    Accu-Chek Softclix Lancets lancets, Please use to check blood sugar up to two times per day. E11.42, Disp: 200 each, Rfl: 4   acetaminophen (TYLENOL) 500 MG tablet, Take 1,000 mg by mouth every 6 (six) hours as needed for moderate pain or headache., Disp: , Rfl:    Ascorbic Acid (VITAMIN C) 1000 MG tablet, Take 1,000 mg by mouth daily., Disp: , Rfl:    aspirin EC 81 MG tablet, Take 1 tablet (81 mg total) by mouth daily. Swallow whole., Disp: 90 tablet, Rfl: 0   betamethasone dipropionate 0.05 % lotion, Apply topically as directed. Apply to the scalp daily up to 5 days for scalp (Patient taking differently: Apply 1 application  topically daily as needed (scalp irritation). Apply to the scalp daily up to 5 days for scalp), Disp: 60 mL, Rfl: 2   Blood Glucose Monitoring Suppl (ACCU-CHEK GUIDE) w/Device KIT, Please use to check blood sugar up to two times per day. E11.42, Disp: 1 kit, Rfl: 0   cephALEXin (KEFLEX) 500 MG capsule, Take 1 capsule (500 mg total) by mouth 2  (two) times daily., Disp: 14 capsule, Rfl: 0   chlorhexidine (PERIDEX) 0.12 % solution, Use as directed 15 mLs in the mouth or  throat 2 (two) times daily. (Patient taking differently: Use as directed 15 mLs in the mouth or throat 2 (two) times daily as needed (mouth sores).), Disp: 1893 mL, Rfl: 3   clobetasol (TEMOVATE) 0.05 % external solution, Apply 1 application  topically daily. To scalp up to 5 times per week, Disp: 50 mL, Rfl: 3   cromolyn (OPTICROM) 4 % ophthalmic solution, Place 1 drop into both eyes 3 (three) times daily as needed (itchy eyes)., Disp: , Rfl:    cyclobenzaprine (FLEXERIL) 10 MG tablet, Take 1 tablet by mouth 3 (three) times daily as needed for muscle spasms., Disp: , Rfl:    Dulaglutide 0.75 MG/0.5ML SOPN, Inject 0.75 mg into the skin once a week., Disp: 6 mL, Rfl: 0   fluticasone (FLONASE) 50 MCG/ACT nasal spray, Place 2 sprays into both nostrils daily as needed for allergies., Disp: 144 mL, Rfl: 1   fluticasone furoate-vilanterol (BREO ELLIPTA) 100-25 MCG/ACT AEPB, TAKE 1 PUFF BY MOUTH EVERY DAY (Patient taking differently: Inhale 1 puff into the lungs daily. TAKE 1 PUFF BY MOUTH EVERY DAY), Disp: 180 each, Rfl: 1   gabapentin (NEURONTIN) 300 MG capsule, TAKE 1 CAPSULE BY MOUTH EVERYDAY AT BEDTIME Strength: 300 mg (Patient taking differently: Take 300 mg by mouth at bedtime.), Disp: 90 capsule, Rfl: 3   glucose blood test strip, Use to check blood glucose 2 times a day as needed, Disp: 100 each, Rfl: 12   glucose blood test strip, Use as instructed a few times a week as needed, Disp: 100 each, Rfl: 12   hydrOXYzine (ATARAX) 25 MG tablet, TAKE 1-2 TABLETS AT BEDTIME AS NEEDED FOR ITCH. MAY MAKE DROWSY - DO NOT DRIVE AFTER TAKING, Disp: 180 tablet, Rfl: 1   inFLIXimab (REMICADE) 100 MG injection, Inject 100 mg into the vein every 7 (seven) weeks. , Disp: , Rfl:    leflunomide (ARAVA) 10 MG tablet, Take 10 mg by mouth daily., Disp: , Rfl:    loperamide (IMODIUM) 2 MG capsule,  Take 2 mg by mouth daily as needed for diarrhea or loose stools., Disp: , Rfl:    metFORMIN (GLUCOPHAGE-XR) 500 MG 24 hr tablet, TAKE 2 TABLETS BY MOUTH TWICE A DAY, Disp: 360 tablet, Rfl: 1   metoprolol succinate (TOPROL-XL) 25 MG 24 hr tablet, Take 1 tablet (25 mg total) by mouth at bedtime., Disp: 90 tablet, Rfl: 0   mometasone (ELOCON) 0.1 % cream, APPLY 1 APPLICATION TOPICALLY DAILY AS NEEDED (RASH). (Patient taking differently: Apply 1 Application topically daily as needed (rash).), Disp: 45 g, Rfl: 3   montelukast (SINGULAIR) 10 MG tablet, TAKE 1 TABLET BY MOUTH EVERY DAY (Patient taking differently: Take 10 mg by mouth at bedtime.), Disp: 30 tablet, Rfl: 0   mupirocin ointment (BACTROBAN) 2 %, APPLY TOPICALLY ONCE DAILY TO OPEN SORES ON ARMS, LEGS AND TOES UNTIL HEALED. THEN APPLY TOPICALLY TO AFFECTED AREAS AS NEEDED FOR FLARES, Disp: 22 g, Rfl: 0   MYRBETRIQ 25 MG TB24 tablet, Take 50 mg by mouth daily., Disp: , Rfl:    nitroGLYCERIN (NITROSTAT) 0.4 MG SL tablet, Place 1 tablet (0.4 mg total) under the tongue every 5 (five) minutes as needed., Disp: 25 tablet, Rfl: 1   omeprazole (PRILOSEC) 40 MG capsule, Take 1 capsule (40 mg total) by mouth 2 (two) times daily., Disp: 180 capsule, Rfl: 0   oxyCODONE-acetaminophen (PERCOCET) 10-325 MG tablet, Take 1 tablet by mouth every 6 (six) hours as needed for pain., Disp: 120 tablet, Rfl: 0  rosuvastatin (CRESTOR) 10 MG tablet, Take 1 tablet (10 mg total) by mouth daily., Disp: 90 tablet, Rfl: 3   sacubitril-valsartan (ENTRESTO) 24-26 MG, Take 1 tablet by mouth 2 (two) times daily., Disp: 60 tablet, Rfl: 0   sertraline (ZOLOFT) 50 MG tablet, Take 1 tablet (50 mg total) by mouth daily., Disp: 90 tablet, Rfl: 3   VENTOLIN HFA 108 (90 Base) MCG/ACT inhaler, INHALE 2 PUFFS BY MOUTH EVERY 4 HOURS AS NEEDED FOR WHEEZING OR SHORTNESS OF BREATH, Disp: 108 each, Rfl: 11   vitamin B-12 (CYANOCOBALAMIN) 1000 MCG tablet, Take 1,000 mcg by mouth daily., Disp: ,  Rfl:    empagliflozin (JARDIANCE) 10 MG TABS tablet, Take 1 tablet (10 mg total) by mouth daily. (Patient not taking: Reported on 07/22/2022), Disp: 90 tablet, Rfl: 0  Orders Placed This Encounter  Procedures   Pro b natriuretic peptide (BNP)   CMP14+EGFR   Magnesium   CBC   EKG 12-Lead    There are no Patient Instructions on file for this visit.   --Continue cardiac medications as reconciled in final medication list. --Return in about 4 weeks (around 08/19/2022) for Follow up, heart failure management,s/p heart cath. or sooner if needed. --Continue follow-up with your primary care physician regarding the management of your other chronic comorbid conditions.  Patient's questions and concerns were addressed to her satisfaction. She voices understanding of the instructions provided during this encounter.   This note was created using a voice recognition software as a result there may be grammatical errors inadvertently enclosed that do not reflect the nature of this encounter. Every attempt is made to correct such errors.  Rex Kras, [place], [facility]  Pager: [phone] Office: [phone]

## 2022-07-23 LAB — CMP14+EGFR
ALT: 12 IU/L (ref 0–32)
AST: 23 IU/L (ref 0–40)
Albumin/Globulin Ratio: 2.1 (ref 1.2–2.2)
Albumin: 4.4 g/dL (ref 3.8–4.8)
Alkaline Phosphatase: 101 IU/L (ref 44–121)
BUN/Creatinine Ratio: 33 — ABNORMAL HIGH (ref 12–28)
BUN: 15 mg/dL (ref 8–27)
Bilirubin Total: <0.2 mg/dL (ref 0.0–1.2)
CO2: 20 mmol/L (ref 20–29)
Calcium: 9.2 mg/dL (ref 8.7–10.3)
Chloride: 102 mmol/L (ref 96–106)
Creatinine, Ser: 0.46 mg/dL — ABNORMAL LOW (ref 0.57–1.00)
Globulin, Total: 2.1 g/dL (ref 1.5–4.5)
Glucose: 132 mg/dL — ABNORMAL HIGH (ref 70–99)
Potassium: 4.2 mmol/L (ref 3.5–5.2)
Sodium: 139 mmol/L (ref 134–144)
Total Protein: 6.5 g/dL (ref 6.0–8.5)
eGFR: 102 mL/min/{1.73_m2} (ref >59)

## 2022-07-23 LAB — CBC
Hematocrit: 34.8 % (ref 34.0–46.6)
Hemoglobin: 10.6 g/dL — ABNORMAL LOW (ref 11.1–15.9)
MCH: 27.1 pg (ref 26.6–33.0)
MCHC: 30.5 g/dL — ABNORMAL LOW (ref 31.5–35.7)
MCV: 89 fL (ref 79–97)
Platelets: 314 10*3/uL (ref 150–450)
RBC: 3.91 x10E6/uL (ref 3.77–5.28)
RDW: 14.5 % (ref 11.7–15.4)
WBC: 8.7 10*3/uL (ref 3.4–10.8)

## 2022-07-23 LAB — PRO B NATRIURETIC PEPTIDE: NT-Pro BNP: 80 pg/mL (ref 0–301)

## 2022-07-23 LAB — MAGNESIUM: Magnesium: 1.9 mg/dL (ref 1.6–2.3)

## 2022-07-26 NOTE — Progress Notes (Signed)
Called patient to inform her about her lab results. Patient understood

## 2022-07-30 ENCOUNTER — Ambulatory Visit (HOSPITAL_COMMUNITY)
Admission: RE | Admit: 2022-07-30 | Discharge: 2022-07-30 | Disposition: A | Discharge status: Home / Self Care | Payer: 59 | Attending: Cardiology | Admitting: Cardiology

## 2022-07-30 ENCOUNTER — Other Ambulatory Visit: Payer: Self-pay

## 2022-07-30 ENCOUNTER — Encounter (HOSPITAL_COMMUNITY): Payer: Self-pay | Admitting: Cardiology

## 2022-07-30 ENCOUNTER — Encounter (HOSPITAL_COMMUNITY)
Admission: RE | Disposition: A | Discharge status: Home / Self Care | Payer: Self-pay | Source: Home / Self Care | Attending: Cardiology

## 2022-07-30 DIAGNOSIS — I5022 Chronic systolic (congestive) heart failure: Secondary | ICD-10-CM | POA: Diagnosis not present

## 2022-07-30 DIAGNOSIS — Z79899 Other long term (current) drug therapy: Secondary | ICD-10-CM | POA: Diagnosis not present

## 2022-07-30 DIAGNOSIS — I110 Hypertensive heart disease with heart failure: Secondary | ICD-10-CM | POA: Diagnosis not present

## 2022-07-30 DIAGNOSIS — E785 Hyperlipidemia, unspecified: Secondary | ICD-10-CM | POA: Diagnosis not present

## 2022-07-30 DIAGNOSIS — E119 Type 2 diabetes mellitus without complications: Secondary | ICD-10-CM | POA: Diagnosis not present

## 2022-07-30 DIAGNOSIS — Z7984 Long term (current) use of oral hypoglycemic drugs: Secondary | ICD-10-CM | POA: Diagnosis not present

## 2022-07-30 DIAGNOSIS — M069 Rheumatoid arthritis, unspecified: Secondary | ICD-10-CM | POA: Insufficient documentation

## 2022-07-30 DIAGNOSIS — Z7982 Long term (current) use of aspirin: Secondary | ICD-10-CM | POA: Diagnosis not present

## 2022-07-30 DIAGNOSIS — I428 Other cardiomyopathies: Secondary | ICD-10-CM | POA: Diagnosis not present

## 2022-07-30 DIAGNOSIS — I2510 Atherosclerotic heart disease of native coronary artery without angina pectoris: Secondary | ICD-10-CM | POA: Diagnosis not present

## 2022-07-30 DIAGNOSIS — Z87891 Personal history of nicotine dependence: Secondary | ICD-10-CM | POA: Diagnosis not present

## 2022-07-30 DIAGNOSIS — I700 Atherosclerosis of aorta: Secondary | ICD-10-CM | POA: Insufficient documentation

## 2022-07-30 DIAGNOSIS — I5020 Unspecified systolic (congestive) heart failure: Secondary | ICD-10-CM | POA: Diagnosis not present

## 2022-07-30 HISTORY — PX: RIGHT/LEFT HEART CATH AND CORONARY ANGIOGRAPHY: CATH118266

## 2022-07-30 LAB — POCT I-STAT EG7
Acid-Base Excess: 1 mmol/L (ref 0.0–2.0)
Acid-base deficit: 1 mmol/L (ref 0.0–2.0)
Acid-base deficit: 2 mmol/L (ref 0.0–2.0)
Acid-base deficit: 2 mmol/L (ref 0.0–2.0)
Acid-base deficit: 3 mmol/L — ABNORMAL HIGH (ref 0.0–2.0)
Acid-base deficit: 3 mmol/L — ABNORMAL HIGH (ref 0.0–2.0)
Acid-base deficit: 3 mmol/L — ABNORMAL HIGH (ref 0.0–2.0)
Acid-base deficit: 3 mmol/L — ABNORMAL HIGH (ref 0.0–2.0)
Bicarbonate: 22.1 mmol/L (ref 20.0–28.0)
Bicarbonate: 22.4 mmol/L (ref 20.0–28.0)
Bicarbonate: 23.5 mmol/L (ref 20.0–28.0)
Bicarbonate: 23.5 mmol/L (ref 20.0–28.0)
Bicarbonate: 23.9 mmol/L (ref 20.0–28.0)
Bicarbonate: 24.8 mmol/L (ref 20.0–28.0)
Bicarbonate: 26.1 mmol/L (ref 20.0–28.0)
Bicarbonate: 23.3 mmol/L (ref 20.0–28.0)
Calcium, Ion: 1.02 mmol/L — ABNORMAL LOW (ref 1.15–1.40)
Calcium, Ion: 1.03 mmol/L — ABNORMAL LOW (ref 1.15–1.40)
Calcium, Ion: 1.06 mmol/L — ABNORMAL LOW (ref 1.15–1.40)
Calcium, Ion: 1.06 mmol/L — ABNORMAL LOW (ref 1.15–1.40)
Calcium, Ion: 1.08 mmol/L — ABNORMAL LOW (ref 1.15–1.40)
Calcium, Ion: 1.09 mmol/L — ABNORMAL LOW (ref 1.15–1.40)
Calcium, Ion: 1.11 mmol/L — ABNORMAL LOW (ref 1.15–1.40)
Calcium, Ion: 1.01 mmol/L — ABNORMAL LOW (ref 1.15–1.40)
HCT: 31 % — ABNORMAL LOW (ref 36.0–46.0)
HCT: 31 % — ABNORMAL LOW (ref 36.0–46.0)
HCT: 31 % — ABNORMAL LOW (ref 36.0–46.0)
HCT: 31 % — ABNORMAL LOW (ref 36.0–46.0)
HCT: 32 % — ABNORMAL LOW (ref 36.0–46.0)
HCT: 32 % — ABNORMAL LOW (ref 36.0–46.0)
HCT: 32 % — ABNORMAL LOW (ref 36.0–46.0)
HCT: 31 % — ABNORMAL LOW (ref 36.0–46.0)
Hemoglobin: 10.5 g/dL — ABNORMAL LOW (ref 12.0–15.0)
Hemoglobin: 10.5 g/dL — ABNORMAL LOW (ref 12.0–15.0)
Hemoglobin: 10.5 g/dL — ABNORMAL LOW (ref 12.0–15.0)
Hemoglobin: 10.5 g/dL — ABNORMAL LOW (ref 12.0–15.0)
Hemoglobin: 10.9 g/dL — ABNORMAL LOW (ref 12.0–15.0)
Hemoglobin: 10.9 g/dL — ABNORMAL LOW (ref 12.0–15.0)
Hemoglobin: 10.9 g/dL — ABNORMAL LOW (ref 12.0–15.0)
Hemoglobin: 10.5 g/dL — ABNORMAL LOW (ref 12.0–15.0)
O2 Saturation: 100 %
O2 Saturation: 71 %
O2 Saturation: 71 %
O2 Saturation: 72 %
O2 Saturation: 75 %
O2 Saturation: 75 %
O2 Saturation: 78 %
O2 Saturation: 73 %
Potassium: 3.7 mmol/L (ref 3.5–5.1)
Potassium: 3.8 mmol/L (ref 3.5–5.1)
Potassium: 3.8 mmol/L (ref 3.5–5.1)
Potassium: 3.9 mmol/L (ref 3.5–5.1)
Potassium: 3.9 mmol/L (ref 3.5–5.1)
Potassium: 3.9 mmol/L (ref 3.5–5.1)
Potassium: 4 mmol/L (ref 3.5–5.1)
Potassium: 3.7 mmol/L (ref 3.5–5.1)
Sodium: 137 mmol/L (ref 135–145)
Sodium: 138 mmol/L (ref 135–145)
Sodium: 138 mmol/L (ref 135–145)
Sodium: 138 mmol/L (ref 135–145)
Sodium: 138 mmol/L (ref 135–145)
Sodium: 139 mmol/L (ref 135–145)
Sodium: 140 mmol/L (ref 135–145)
Sodium: 140 mmol/L (ref 135–145)
TCO2: 23 mmol/L (ref 22–32)
TCO2: 24 mmol/L (ref 22–32)
TCO2: 25 mmol/L (ref 22–32)
TCO2: 25 mmol/L (ref 22–32)
TCO2: 25 mmol/L (ref 22–32)
TCO2: 26 mmol/L (ref 22–32)
TCO2: 27 mmol/L (ref 22–32)
TCO2: 25 mmol/L (ref 22–32)
pCO2, Ven: 39.3 mmHg — ABNORMAL LOW (ref 44–60)
pCO2, Ven: 42.7 mmHg — ABNORMAL LOW (ref 44–60)
pCO2, Ven: 44.3 mmHg (ref 44–60)
pCO2, Ven: 44.7 mmHg (ref 44–60)
pCO2, Ven: 45 mmHg (ref 44–60)
pCO2, Ven: 46.2 mmHg (ref 44–60)
pCO2, Ven: 47.6 mmHg (ref 44–60)
pCO2, Ven: 46.3 mmHg (ref 44–60)
pH, Ven: 7.322 (ref 7.25–7.43)
pH, Ven: 7.325 (ref 7.25–7.43)
pH, Ven: 7.327 (ref 7.25–7.43)
pH, Ven: 7.329 (ref 7.25–7.43)
pH, Ven: 7.331 (ref 7.25–7.43)
pH, Ven: 7.358 (ref 7.25–7.43)
pH, Ven: 7.374 (ref 7.25–7.43)
pH, Ven: 7.309 (ref 7.25–7.43)
pO2, Ven: 186 mmHg — ABNORMAL HIGH (ref 32–45)
pO2, Ven: 40 mmHg (ref 32–45)
pO2, Ven: 41 mmHg (ref 32–45)
pO2, Ven: 42 mmHg (ref 32–45)
pO2, Ven: 43 mmHg (ref 32–45)
pO2, Ven: 43 mmHg (ref 32–45)
pO2, Ven: 44 mmHg (ref 32–45)
pO2, Ven: 42 mmHg (ref 32–45)

## 2022-07-30 LAB — GLUCOSE, CAPILLARY: Glucose-Capillary: 206 mg/dL — ABNORMAL HIGH (ref 70–99)

## 2022-07-30 SURGERY — RIGHT/LEFT HEART CATH AND CORONARY ANGIOGRAPHY
Anesthesia: LOCAL

## 2022-07-30 MED ORDER — FENTANYL CITRATE (PF) 100 MCG/2ML IJ SOLN
INTRAMUSCULAR | Status: DC | PRN
  Administered 2022-07-30: 25 ug via INTRAVENOUS

## 2022-07-30 MED ORDER — SODIUM CHLORIDE 0.9% FLUSH: 3.0000 mL | Freq: Two times a day (BID) | INTRAVENOUS | Status: DC

## 2022-07-30 MED ORDER — ASPIRIN 81 MG PO CHEW: 81.0000 mg | CHEWABLE_TABLET | ORAL | Status: DC

## 2022-07-30 MED ORDER — HEPARIN SODIUM (PORCINE) 1000 UNIT/ML IJ SOLN
INTRAMUSCULAR | Status: AC
  Filled 2022-07-30: qty 10

## 2022-07-30 MED ORDER — SODIUM CHLORIDE 0.9% FLUSH: 3.0000 mL | INTRAVENOUS | Status: DC | PRN

## 2022-07-30 MED ORDER — MIDAZOLAM HCL 2 MG/2ML IJ SOLN
INTRAMUSCULAR | Status: AC
  Filled 2022-07-30: qty 2

## 2022-07-30 MED ORDER — SODIUM CHLORIDE 0.9 % IV SOLN: 250.0000 mL | INTRAVENOUS | Status: DC | PRN

## 2022-07-30 MED ORDER — MIDAZOLAM HCL 2 MG/2ML IJ SOLN
INTRAMUSCULAR | Status: DC | PRN
  Administered 2022-07-30: 1 mg via INTRAVENOUS

## 2022-07-30 MED ORDER — HEPARIN (PORCINE) IN NACL 1000-0.9 UT/500ML-% IV SOLN
INTRAVENOUS | Status: DC | PRN
  Administered 2022-07-30 (×2): 500 mL

## 2022-07-30 MED ORDER — SODIUM CHLORIDE 0.9 % IV SOLN: INTRAVENOUS | Status: DC

## 2022-07-30 MED ORDER — FENTANYL CITRATE (PF) 100 MCG/2ML IJ SOLN
INTRAMUSCULAR | Status: AC
  Filled 2022-07-30: qty 2

## 2022-07-30 MED ORDER — LIDOCAINE HCL (PF) 1 % IJ SOLN
INTRAMUSCULAR | Status: DC | PRN
  Administered 2022-07-30: 2 mL
  Administered 2022-07-30: 5 mL
  Administered 2022-07-30: 2 mL

## 2022-07-30 MED ORDER — LABETALOL HCL 5 MG/ML IV SOLN: 10.0000 mg | INTRAVENOUS | Status: DC | PRN

## 2022-07-30 MED ORDER — VERAPAMIL HCL 2.5 MG/ML IV SOLN
INTRAVENOUS | Status: AC
  Filled 2022-07-30: qty 2

## 2022-07-30 MED ORDER — HYDRALAZINE HCL 20 MG/ML IJ SOLN: 10.0000 mg | INTRAMUSCULAR | Status: DC | PRN

## 2022-07-30 MED ORDER — VERAPAMIL HCL 2.5 MG/ML IV SOLN
INTRAVENOUS | Status: DC | PRN
  Administered 2022-07-30: 10 mL via INTRA_ARTERIAL

## 2022-07-30 MED ORDER — HEPARIN (PORCINE) IN NACL 1000-0.9 UT/500ML-% IV SOLN
INTRAVENOUS | Status: AC
  Filled 2022-07-30: qty 500

## 2022-07-30 MED ORDER — ACETAMINOPHEN 325 MG PO TABS: 650.0000 mg | ORAL_TABLET | ORAL | Status: DC | PRN

## 2022-07-30 MED ORDER — IOHEXOL 350 MG/ML SOLN
INTRAVENOUS | Status: DC | PRN
  Administered 2022-07-30: 20 mL

## 2022-07-30 MED ORDER — LIDOCAINE HCL (PF) 1 % IJ SOLN
INTRAMUSCULAR | Status: AC
  Filled 2022-07-30: qty 30

## 2022-07-30 MED ORDER — HEPARIN SODIUM (PORCINE) 1000 UNIT/ML IJ SOLN
INTRAMUSCULAR | Status: DC | PRN
  Administered 2022-07-30: 3000 [IU] via INTRAVENOUS

## 2022-07-30 MED ORDER — ONDANSETRON HCL 4 MG/2ML IJ SOLN: 4.0000 mg | Freq: Four times a day (QID) | INTRAMUSCULAR | Status: DC | PRN

## 2022-07-30 SURGICAL SUPPLY — 16 items
CATH 5FR JL3.5 JR4 ANG PIG MP (CATHETERS) IMPLANT
CATH BALLN WEDGE 5F 110CM (CATHETERS) IMPLANT
DEVICE RAD COMP TR BAND LRG (VASCULAR PRODUCTS) IMPLANT
GLIDESHEATH SLEND A-KIT 6F 22G (SHEATH) IMPLANT
GUIDEWIRE .025 260CM (WIRE) IMPLANT
GUIDEWIRE INQWIRE 1.5J.035X260 (WIRE) IMPLANT
INQWIRE 1.5J .035X260CM (WIRE) ×1
KIT HEART LEFT (KITS) ×2 IMPLANT
PACK CARDIAC CATHETERIZATION (CUSTOM PROCEDURE TRAY) ×2 IMPLANT
PROTECTION STATION PRESSURIZED (MISCELLANEOUS) ×1
SHEATH GLIDE SLENDER 4/5FR (SHEATH) IMPLANT
SHEATH PROBE COVER 6X72 (BAG) IMPLANT
STATION PROTECTION PRESSURIZED (MISCELLANEOUS) IMPLANT
TRANSDUCER W/STOPCOCK (MISCELLANEOUS) ×2 IMPLANT
TUBING CIL FLEX 10 FLL-RA (TUBING) ×2 IMPLANT
WIRE HI TORQ VERSACORE-J 145CM (WIRE) IMPLANT

## 2022-07-30 NOTE — Discharge Instructions (Signed)
HOLD [facility] FOR A FULL 48 HOURS AFTER DISCHARGE.

## 2022-07-30 NOTE — H&P (Signed)
OV 07/22/2022 copied for documentation    ID:  Michelle [name], DOB [birth year], MRN [id number]  PCP:  [name], MD  Cardiologist:  Rex Kras, DO, [facility] (established care 07/22/2022) Former Cardiology Providers: Dr. Einar Gip  REASON FOR CONSULT: Chronic HFrEF  REQUESTING PHYSICIAN:  [name], MD [address] [place],  [place] [address]  Chief Complaint  Patient presents with   chronic heart failure   New Patient (Initial Visit)    HPI  Michelle [name] is a 72 y.o. Caucasian female who presents to the clinic for evaluation of HFrEF at the request of [name], MD. Her past medical history and cardiovascular risk factors include: Cardiomyopathy/chronic HFrEF, coronary artery calcification (CT scan care everywhere), aortic atherosclerosis, former smoker, non-insulin-dependent diabetes mellitus type 2, hyperlipidemia, hypertension.   Patient presents today for evaluation of HFrEF.  She was recently seen by PCP and was noted to have a cardiac murmur and echocardiogram was performed which notes moderately reduced LVEF with global hypokinesis and grade 1 diastolic impairment.  Her medications were uptitrated and referred to cardiology for further evaluation and management.  Patient denies anginal discomfort, orthopnea, PND, or lower extremity swelling.  She feels tired fatigued joint pain has been ongoing since her third surgery in August 2023.  At the last office visit she was prescribed Jardiance as well as metoprolol.  She has not taken Jardiance given her history of yeast infections.  She is currently battling yeast infection as we speak.  Denies any use of alcohol.  No recent viral infections.  FUNCTIONAL STATUS: No structured exercise program or daily routine.   ALLERGIES: Allergies  Allergen Reactions   Zofran [Ondansetron Hcl] Other (See Comments)    Prolong QT   Ace Inhibitors Cough    Occurred with lisinopril   Levofloxacin Other (See Comments)    Felt things  on her legs that were not there,made her stomach hurt   Lisinopril Cough   Sulfonamide Derivatives Hives and Itching   Sulfa Antibiotics     Reaction:  Other reaction(s): ITCHING    MEDICATION LIST PRIOR TO VISIT: Current Meds  Medication Sig   Accu-Chek Softclix Lancets lancets Please use to check blood sugar up to two times per day. E11.42   acetaminophen (TYLENOL) 500 MG tablet Take 1,000 mg by mouth every 6 (six) hours as needed for moderate pain or headache.   Ascorbic Acid (VITAMIN C) 1000 MG tablet Take 1,000 mg by mouth daily.   aspirin EC 81 MG tablet Take 1 tablet (81 mg total) by mouth daily. Swallow whole.   betamethasone dipropionate 0.05 % lotion Apply topically as directed. Apply to the scalp daily up to 5 days for scalp (Patient taking differently: Apply 1 application  topically daily as needed (scalp irritation). Apply to the scalp daily up to 5 days for scalp)   Blood Glucose Monitoring Suppl (ACCU-CHEK GUIDE) w/Device KIT Please use to check blood sugar up to two times per day. E11.42   cephALEXin (KEFLEX) 500 MG capsule Take 1 capsule (500 mg total) by mouth 2 (two) times daily.   chlorhexidine (PERIDEX) 0.12 % solution Use as directed 15 mLs in the mouth or throat 2 (two) times daily. (Patient taking differently: Use as directed 15 mLs in the mouth or throat 2 (two) times daily as needed (mouth sores).)   clobetasol (TEMOVATE) 0.05 % external solution Apply 1 application  topically daily. To scalp up to 5 times per week   cromolyn (OPTICROM) 4 % ophthalmic  solution Place 1 drop into both eyes 3 (three) times daily as needed (itchy eyes).   cyclobenzaprine (FLEXERIL) 10 MG tablet Take 1 tablet by mouth 3 (three) times daily as needed for muscle spasms.   Dulaglutide 0.75 MG/0.5ML SOPN Inject 0.75 mg into the skin once a week.   fluticasone (FLONASE) 50 MCG/ACT nasal spray Place 2 sprays into both nostrils daily as needed for allergies.   fluticasone furoate-vilanterol (BREO  ELLIPTA) 100-25 MCG/ACT AEPB TAKE 1 PUFF BY MOUTH EVERY DAY (Patient taking differently: Inhale 1 puff into the lungs daily. TAKE 1 PUFF BY MOUTH EVERY DAY)   gabapentin (NEURONTIN) 300 MG capsule TAKE 1 CAPSULE BY MOUTH EVERYDAY AT BEDTIME Strength: 300 mg (Patient taking differently: Take 300 mg by mouth at bedtime.)   glucose blood test strip Use to check blood glucose 2 times a day as needed   glucose blood test strip Use as instructed a few times a week as needed   hydrOXYzine (ATARAX) 25 MG tablet TAKE 1-2 TABLETS AT BEDTIME AS NEEDED FOR ITCH. MAY MAKE DROWSY - DO NOT DRIVE AFTER TAKING   inFLIXimab (REMICADE) 100 MG injection Inject 100 mg into the vein every 7 (seven) weeks.    leflunomide (ARAVA) 10 MG tablet Take 10 mg by mouth daily.   loperamide (IMODIUM) 2 MG capsule Take 2 mg by mouth daily as needed for diarrhea or loose stools.   metFORMIN (GLUCOPHAGE-XR) 500 MG 24 hr tablet TAKE 2 TABLETS BY MOUTH TWICE A DAY   metoprolol succinate (TOPROL-XL) 25 MG 24 hr tablet Take 1 tablet (25 mg total) by mouth at bedtime.   mometasone (ELOCON) 0.1 % cream APPLY 1 APPLICATION TOPICALLY DAILY AS NEEDED (RASH). (Patient taking differently: Apply 1 Application topically daily as needed (rash).)   montelukast (SINGULAIR) 10 MG tablet TAKE 1 TABLET BY MOUTH EVERY DAY (Patient taking differently: Take 10 mg by mouth at bedtime.)   mupirocin ointment (BACTROBAN) 2 % APPLY TOPICALLY ONCE DAILY TO OPEN SORES ON ARMS, LEGS AND TOES UNTIL HEALED. THEN APPLY TOPICALLY TO AFFECTED AREAS AS NEEDED FOR FLARES   MYRBETRIQ 25 MG TB24 tablet Take 50 mg by mouth daily.   nitroGLYCERIN (NITROSTAT) 0.4 MG SL tablet Place 1 tablet (0.4 mg total) under the tongue every 5 (five) minutes as needed.   omeprazole (PRILOSEC) 40 MG capsule Take 1 capsule (40 mg total) by mouth 2 (two) times daily.   oxyCODONE-acetaminophen (PERCOCET) 10-325 MG tablet Take 1 tablet by mouth every 6 (six) hours as needed for pain.    rosuvastatin (CRESTOR) 10 MG tablet Take 1 tablet (10 mg total) by mouth daily.   sacubitril-valsartan (ENTRESTO) 24-26 MG Take 1 tablet by mouth 2 (two) times daily.   sertraline (ZOLOFT) 50 MG tablet Take 1 tablet (50 mg total) by mouth daily.   VENTOLIN HFA 108 (90 Base) MCG/ACT inhaler INHALE 2 PUFFS BY MOUTH EVERY 4 HOURS AS NEEDED FOR WHEEZING OR SHORTNESS OF BREATH   vitamin B-12 (CYANOCOBALAMIN) 1000 MCG tablet Take 1,000 mcg by mouth daily.     PAST MEDICAL HISTORY: Past Medical History:  Diagnosis Date   Actinic keratosis    Allergic rhinitis 08/28/2006   Anemia    low iron   Anginal pain ([place])    Asthma, chronic 03/14/2010   Does seem to use her albuterol excessively. Consider change to more manageable medicines and treat more like COPD in the future.   Carpal tunnel syndrome, left 05/29/2009   Last Assessment & Plan:  Advised to wear wrist brace at night when she is sleeping.  If this does not help will consider having her go to sports medicine.   Chronic frontal sinusitis 08/28/2016   Chronic left maxillary sinusitis 08/28/2016   Diverticulosis    Essential hypertension 10/30/2012   Gastroparesis 11/19/2011   DM and chronic narcotics contribute.  Causes dysphagia like symptoms    Generalized anxiety disorder 08/28/2006   GERD (gastroesophageal reflux disease)    Hearing loss 07/19/2015   Hearing aid in right ear   History of cataracts, bilateral    Hyperlipidemia 08/28/2006   Lumbar radiculopathy 04/07/2018   Lung nodules    Osteoporosis 08/08/2011   Pain in joint, pelvic region and thigh 11/18/2012   Radial styloid tenosynovitis 08/15/2013   Restless leg syndrome 05/14/2012   Seropositive rheumatoid arthritis 01/08/2010   Patient is being followed by rheumatologist at [facility] patient is on Plaquenil and as well as prednisone.   Changed to methotrexate and prednisone in May of 2013  Patient does have a pain contract here with [facility] receives oxycodone 10/325 mg tabs every 6 hours. This has been titrated down from significant doses of methadone previously. Could attempt to tit   Shoulder joint pain 03/25/2011   The patient is compensating due to her not being able to use her left arm right now rotator cuff impingement if he continues to worsen would not be surprised if she does get a tear on this side as well   Squamous cell carcinoma in situ of skin 11/25/2014   Referred to dermatology for excision    Squamous cell carcinoma of skin 12/26/2014   Right distal pretibial. SCC-KA pattern.   Squamous cell carcinoma of skin 03/27/2015   Right inf. lat. knee. SCC-KA pattern.    Squamous cell carcinoma of skin 10/07/2016   Left lateral calf superior. WD SCC.   Squamous cell carcinoma of skin 10/07/2016   Left lateral calf inferior. WD SCC.   Squamous cell carcinoma of skin 04/28/2018   Left mid med. pretibial. WD SCC.   Squamous cell carcinoma of skin 08/12/2019   Left mid med. pretibial sup. KA type. EDC   Squamous cell carcinoma of skin 08/12/2019   Left mid med. pretibial inferior. WD. EDC.   Tear of medial meniscus of knee, left 04/27/2009   Type II diabetes mellitus 08/28/2006   Overview:  Patient has her ups and downs, go in line with flares of her RA and prednisone uses.  Patient does have hx of hypoglycemic events so would not try for perfect control but should have A1c goal around 7.0 Lab Results  Component Value Date   HGBA1C 6.9 12/27/2011   Last Assessment & Plan:  At goal. Will continue current metformin    PAST SURGICAL HISTORY: Past Surgical History:  Procedure Laterality Date   ABDOMINAL HYSTERECTOMY     APPENDECTOMY     BACK SURGERY     BIOPSY  05/10/2021   Procedure: BIOPSY;  Surgeon: Milus Banister, MD;  Location: [facility] END[facility];  Service: Endoscopy;;  EGD and COLON   CARPAL TUNNEL RELEASE Right    CATARACT EXTRACTION Left    with lid lift   CHOLECYSTECTOMY     COLONOSCOPY     COLONOSCOPY  WITH PROPOFOL N/A 05/10/2021   Procedure: COLONOSCOPY WITH PROPOFOL;  Surgeon: Milus Banister, MD;  Location: [facility] END[facility];  Service: Endoscopy;  Laterality: N/A;   ESOPHAGOGASTRODUODENOSCOPY (EGD) WITH PROPOFOL N/A 05/10/2021  Procedure: ESOPHAGOGASTRODUODENOSCOPY (EGD) WITH PROPOFOL;  Surgeon: Milus Banister, MD;  Location: [facility];  Service: Endoscopy;  Laterality: N/A;   HERNIA REPAIR     KNEE SURGERY Right    x2- cartilage annd scar tissue   LEFT AND RIGHT HEART CATHETERIZATION WITH CORONARY ANGIOGRAM N/A 08/17/2013   Procedure: LEFT AND RIGHT HEART CATHETERIZATION WITH CORONARY ANGIOGRAM;  Surgeon: Laverda Page, MD;  Location: [facility] CATH LAB;  Service: Cardiovascular;  Laterality: N/A;   lumbar back surgery  04/04/2018   done by neurosurgeron   right foot bone spurs removed     ROTATOR CUFF REPAIR Right    SHOULDER SURGERY Left    x2- rotatar cuff tear   UPPER GASTROINTESTINAL ENDOSCOPY      FAMILY HISTORY: The patient family history includes Bell's palsy in her maternal grandmother; COPD in her sister; Cancer in her sister; Colon cancer in her daughter and maternal aunt; Diabetes in her daughter; Healthy in her daughter; Hypertension in her daughter; Lung cancer (age of onset: 53) in her brother; Rheum arthritis (age of onset: 25) in her sister; Stroke (age of onset: 59) in her father; Tremor in her mother.  SOCIAL HISTORY:  The patient  reports that she quit smoking about 28 years ago. Her smoking use included cigarettes. She has a 7.50 pack-year smoking history. She has been exposed to tobacco smoke. She has never used smokeless tobacco. She reports that she does not drink alcohol and does not use drugs.  REVIEW OF SYSTEMS: Review of Systems  Constitutional: Positive for malaise/fatigue.  Cardiovascular:  Negative for chest pain, claudication, dyspnea on exertion, irregular heartbeat, leg swelling, near-syncope, orthopnea, palpitations, paroxysmal nocturnal dyspnea and  syncope.  Respiratory:  Negative for shortness of breath.   Hematologic/Lymphatic: Negative for bleeding problem.  Musculoskeletal:  Positive for back pain and joint pain. Negative for muscle cramps and myalgias.  Neurological:  Negative for dizziness and light-headedness.    PHYSICAL EXAM:    07/22/2022    9:10 AM 07/19/2022   11:44 AM 07/16/2022   10:11 AM  Vitals with BMI  Height '5\' 2"'$     Weight 124 lbs 123 lbs 8 oz 124 lbs 10 oz  BMI 22.67 86.57 84.69  Systolic 629 528 413  Diastolic 81 72 79  Pulse 92 98 93    Physical Exam  Constitutional: No distress.  Age appropriate, hemodynamically stable.   HENT:  Poor oral hygiene   Neck: No JVD present.  Cardiovascular: Normal rate, regular rhythm, S1 normal, S2 normal, intact distal pulses and normal pulses. Exam reveals no gallop, no S3 and no S4.  No murmur heard. Pulmonary/Chest: Effort normal and breath sounds normal. No stridor. She has no wheezes. She has no rales.  Abdominal: Soft. Bowel sounds are normal. She exhibits no distension. There is no abdominal tenderness.  Musculoskeletal:        General: No edema.     Cervical back: Neck supple.  Neurological: She is alert and oriented to person, place, and time. She has intact cranial nerves (2-12).  Skin: Skin is warm and moist.   CARDIAC DATABASE: EKG: 07/22/2022: Sinus rhythm, 97 bpm, normal axis, without underlying injury pattern.  Echocardiogram: 07/17/2022: LVEF 35-40%, global hypokinesis, grade 1 diastolic impairment, mild MR, estimated RAP 3 mmHg.   Stress Testing: No results found for this or any previous visit from the past 1095 days.   Heart Catheterization: Feb 2015: no records available for review  ABI 12/04/2021 Right: Resting right ankle-brachial index  is within normal range. No evidence of significant right lower extremity arterial disease. The right toe-brachial index is normal.   Left: Resting left ankle-brachial index is within normal range. No  evidence of significant left lower extremity arterial disease. The left toe-brachial index is normal.   LABORATORY DATA:    Latest Ref Rng & Units 07/22/2022   10:54 AM 02/07/2022    9:37 AM 02/28/2021    9:16 AM  CBC  WBC 3.4 - 10.8 x10E3/uL 8.7  9.2  8.7   Hemoglobin 11.1 - 15.9 g/dL 10.6  11.8  12.0   Hematocrit 34.0 - 46.6 % 34.8  36.7  37.7   Platelets 150 - 450 x10E3/uL 314  245  259        Latest Ref Rng & Units 07/22/2022   10:54 AM 02/07/2022    9:37 AM 08/31/2020   10:33 AM  CMP  Glucose 70 - 99 mg/dL 132  122  180   BUN 8 - 27 mg/dL '15  10  15   '$ Creatinine 0.57 - 1.00 mg/dL 0.46  0.42  0.70   Sodium 134 - 144 mmol/L 139  137  138   Potassium 3.5 - 5.2 mmol/L 4.2  4.1  4.1   Chloride 96 - 106 mmol/L 102  104  101   CO2 20 - 29 mmol/L '20  26  19   '$ Calcium 8.7 - 10.3 mg/dL 9.2  9.5  9.6   Total Protein 6.0 - 8.5 g/dL 6.5   6.8   Total Bilirubin 0.0 - 1.2 mg/dL <0.2   0.2   Alkaline Phos 44 - 121 IU/L 101   109   AST 0 - 40 IU/L 23   21   ALT 0 - 32 IU/L 12   16     Lipid Panel  Lab Results  Component Value Date   CHOL 110 02/28/2021   HDL 42 02/28/2021   LDLCALC 49 02/28/2021   LDLDIRECT 43 06/01/2020   TRIG 100 02/28/2021   CHOLHDL 2.6 02/28/2021     No components found for: "NTPROBNP" Recent Labs    07/22/22 1054  PROBNP 80   No results for input(s): "TSH" in the last 8760 hours.  BMP Recent Labs    02/07/22 0937 07/22/22 1054  NA 137 139  K 4.1 4.2  CL 104 102  CO2 26 20  GLUCOSE 122* 132*  BUN 10 15  CREATININE 0.42* 0.46*  CALCIUM 9.5 9.2  GFRNONAA >60  --     HEMOGLOBIN A1C Lab Results  Component Value Date   HGBA1C 6.1 (H) 05/28/2022   MPG 142.72 02/07/2022    IMPRESSION:    ICD-10-CM   1. Chronic HFrEF (heart failure with reduced ejection fraction) (HCC)  I50.22 EKG 12-Lead    Pro b natriuretic peptide (BNP)    CMP14+EGFR    Magnesium    CBC    sacubitril-valsartan (ENTRESTO) 24-26 MG    2. Cardiomyopathy, unspecified  type ([place])  I42.9     3. Coronary artery calcification seen on computed tomography  I25.10 aspirin EC 81 MG tablet    4. Aortic atherosclerosis (HCC)  I70.0 aspirin EC 81 MG tablet    5. Non-insulin dependent type 2 diabetes mellitus ([place])  E11.9     6. Type 2 diabetes mellitus with hyperlipidemia (HCC)  E11.69    E78.5     7. Benign hypertension  I10     8. Former smoker  [phone]  9. Rheumatoid arthritis, involving unspecified site, unspecified whether rheumatoid factor present ([place])  M06.9        RECOMMENDATIONS: Michelle [name] is a 72 y.o. Caucasian female whose past medical history and cardiac risk factors include: Coronary artery calcification, aortic atherosclerosis, former smoker, non-insulin-dependent diabetes mellitus type 2, hyperlipidemia, hypertension.   Chronic HFrEF (heart failure with reduced ejection fraction) (HCC) Cardiomyopathy, unspecified type ([place]) Newly discovered. Denies anginal discomfort or heart failure symptoms. Euvolemic on physical examination. Discussed undergoing left and right heart catheterization to rule out obstructive CAD and to evaluate hemodynamics given the newly discovered cardiomyopathy and multiple cardiovascular risk factors including diabetes and former smoker.  Discussed the risks, benefits, and alternatives to left and right heart catheterization.  Patient verbalizes understanding and wishes to proceed forward. Was started on Jardiance by PCP but stopped taking it due to history of yeast infections. Will start Entresto 24/26 mg p.o. twice daily Will order baseline labs for her heart catheterization will need repeat BMP a week later after starting Entresto. Currently on aspirin, Toprol-XL, rosuvastatin.  Coronary artery calcification seen on computed tomography Aortic atherosclerosis (HCC) Continue aspirin and statin therapy  Non-insulin dependent type 2 diabetes mellitus ([place]) Reemphasized importance of glycemic  control. Currently on statin therapy, will start ARNI.  Unable to do SGLT2 inhibitors due to a prior history of yeast infections.  Type 2 diabetes mellitus with hyperlipidemia ([place]) Recommend a goal LDL of <55 mg/dL. Lipids currently being managed by PCP  Benign hypertension Office blood pressures are not well-controlled. Medication changes as discussed above. Advised to keep a log of her blood pressure stable.  FINAL MEDICATION LIST END OF ENCOUNTER: Meds ordered this encounter  Medications   sacubitril-valsartan (ENTRESTO) 24-26 MG    Sig: Take 1 tablet by mouth 2 (two) times daily.    Dispense:  60 tablet    Refill:  0   aspirin EC 81 MG tablet    Sig: Take 1 tablet (81 mg total) by mouth daily. Swallow whole.    Dispense:  90 tablet    Refill:  0    Medications Discontinued During This Encounter  Medication Reason   oxyCODONE-acetaminophen (PERCOCET) 10-325 MG tablet    oxyCODONE-acetaminophen (PERCOCET) 10-325 MG tablet      Current Outpatient Medications:    Accu-Chek Softclix Lancets lancets, Please use to check blood sugar up to two times per day. E11.42, Disp: 200 each, Rfl: 4   acetaminophen (TYLENOL) 500 MG tablet, Take 1,000 mg by mouth every 6 (six) hours as needed for moderate pain or headache., Disp: , Rfl:    Ascorbic Acid (VITAMIN C) 1000 MG tablet, Take 1,000 mg by mouth daily., Disp: , Rfl:    aspirin EC 81 MG tablet, Take 1 tablet (81 mg total) by mouth daily. Swallow whole., Disp: 90 tablet, Rfl: 0   betamethasone dipropionate 0.05 % lotion, Apply topically as directed. Apply to the scalp daily up to 5 days for scalp (Patient taking differently: Apply 1 application  topically daily as needed (scalp irritation). Apply to the scalp daily up to 5 days for scalp), Disp: 60 mL, Rfl: 2   Blood Glucose Monitoring Suppl (ACCU-CHEK GUIDE) w/Device KIT, Please use to check blood sugar up to two times per day. E11.42, Disp: 1 kit, Rfl: 0   cephALEXin (KEFLEX) 500 MG  capsule, Take 1 capsule (500 mg total) by mouth 2 (two) times daily., Disp: 14 capsule, Rfl: 0   chlorhexidine (PERIDEX) 0.12 % solution, Use as directed  15 mLs in the mouth or throat 2 (two) times daily. (Patient taking differently: Use as directed 15 mLs in the mouth or throat 2 (two) times daily as needed (mouth sores).), Disp: 1893 mL, Rfl: 3   clobetasol (TEMOVATE) 0.05 % external solution, Apply 1 application  topically daily. To scalp up to 5 times per week, Disp: 50 mL, Rfl: 3   cromolyn (OPTICROM) 4 % ophthalmic solution, Place 1 drop into both eyes 3 (three) times daily as needed (itchy eyes)., Disp: , Rfl:    cyclobenzaprine (FLEXERIL) 10 MG tablet, Take 1 tablet by mouth 3 (three) times daily as needed for muscle spasms., Disp: , Rfl:    Dulaglutide 0.75 MG/0.5ML SOPN, Inject 0.75 mg into the skin once a week., Disp: 6 mL, Rfl: 0   fluticasone (FLONASE) 50 MCG/ACT nasal spray, Place 2 sprays into both nostrils daily as needed for allergies., Disp: 144 mL, Rfl: 1   fluticasone furoate-vilanterol (BREO ELLIPTA) 100-25 MCG/ACT AEPB, TAKE 1 PUFF BY MOUTH EVERY DAY (Patient taking differently: Inhale 1 puff into the lungs daily. TAKE 1 PUFF BY MOUTH EVERY DAY), Disp: 180 each, Rfl: 1   gabapentin (NEURONTIN) 300 MG capsule, TAKE 1 CAPSULE BY MOUTH EVERYDAY AT BEDTIME Strength: 300 mg (Patient taking differently: Take 300 mg by mouth at bedtime.), Disp: 90 capsule, Rfl: 3   glucose blood test strip, Use to check blood glucose 2 times a day as needed, Disp: 100 each, Rfl: 12   glucose blood test strip, Use as instructed a few times a week as needed, Disp: 100 each, Rfl: 12   hydrOXYzine (ATARAX) 25 MG tablet, TAKE 1-2 TABLETS AT BEDTIME AS NEEDED FOR ITCH. MAY MAKE DROWSY - DO NOT DRIVE AFTER TAKING, Disp: 180 tablet, Rfl: 1   inFLIXimab (REMICADE) 100 MG injection, Inject 100 mg into the vein every 7 (seven) weeks. , Disp: , Rfl:    leflunomide (ARAVA) 10 MG tablet, Take 10 mg by mouth daily.,  Disp: , Rfl:    loperamide (IMODIUM) 2 MG capsule, Take 2 mg by mouth daily as needed for diarrhea or loose stools., Disp: , Rfl:    metFORMIN (GLUCOPHAGE-XR) 500 MG 24 hr tablet, TAKE 2 TABLETS BY MOUTH TWICE A DAY, Disp: 360 tablet, Rfl: 1   metoprolol succinate (TOPROL-XL) 25 MG 24 hr tablet, Take 1 tablet (25 mg total) by mouth at bedtime., Disp: 90 tablet, Rfl: 0   mometasone (ELOCON) 0.1 % cream, APPLY 1 APPLICATION TOPICALLY DAILY AS NEEDED (RASH). (Patient taking differently: Apply 1 Application topically daily as needed (rash).), Disp: 45 g, Rfl: 3   montelukast (SINGULAIR) 10 MG tablet, TAKE 1 TABLET BY MOUTH EVERY DAY (Patient taking differently: Take 10 mg by mouth at bedtime.), Disp: 30 tablet, Rfl: 0   mupirocin ointment (BACTROBAN) 2 %, APPLY TOPICALLY ONCE DAILY TO OPEN SORES ON ARMS, LEGS AND TOES UNTIL HEALED. THEN APPLY TOPICALLY TO AFFECTED AREAS AS NEEDED FOR FLARES, Disp: 22 g, Rfl: 0   MYRBETRIQ 25 MG TB24 tablet, Take 50 mg by mouth daily., Disp: , Rfl:    nitroGLYCERIN (NITROSTAT) 0.4 MG SL tablet, Place 1 tablet (0.4 mg total) under the tongue every 5 (five) minutes as needed., Disp: 25 tablet, Rfl: 1   omeprazole (PRILOSEC) 40 MG capsule, Take 1 capsule (40 mg total) by mouth 2 (two) times daily., Disp: 180 capsule, Rfl: 0   oxyCODONE-acetaminophen (PERCOCET) 10-325 MG tablet, Take 1 tablet by mouth every 6 (six) hours as needed for pain., Disp:  120 tablet, Rfl: 0   rosuvastatin (CRESTOR) 10 MG tablet, Take 1 tablet (10 mg total) by mouth daily., Disp: 90 tablet, Rfl: 3   sacubitril-valsartan (ENTRESTO) 24-26 MG, Take 1 tablet by mouth 2 (two) times daily., Disp: 60 tablet, Rfl: 0   sertraline (ZOLOFT) 50 MG tablet, Take 1 tablet (50 mg total) by mouth daily., Disp: 90 tablet, Rfl: 3   VENTOLIN HFA 108 (90 Base) MCG/ACT inhaler, INHALE 2 PUFFS BY MOUTH EVERY 4 HOURS AS NEEDED FOR WHEEZING OR SHORTNESS OF BREATH, Disp: 108 each, Rfl: 11   vitamin B-12 (CYANOCOBALAMIN) 1000  MCG tablet, Take 1,000 mcg by mouth daily., Disp: , Rfl:    empagliflozin (JARDIANCE) 10 MG TABS tablet, Take 1 tablet (10 mg total) by mouth daily. (Patient not taking: Reported on 07/22/2022), Disp: 90 tablet, Rfl: 0  Orders Placed This Encounter  Procedures   Pro b natriuretic peptide (BNP)   CMP14+EGFR   Magnesium   CBC   EKG 12-Lead    There are no Patient Instructions on file for this visit.   --Continue cardiac medications as reconciled in final medication list. --Return in about 4 weeks (around 08/19/2022) for Follow up, heart failure management,s/p heart cath. or sooner if needed. --Continue follow-up with your primary care physician regarding the management of your other chronic comorbid conditions.  Patient's questions and concerns were addressed to her satisfaction. She voices understanding of the instructions provided during this encounter.   This note was created using a voice recognition software as a result there may be grammatical errors inadvertently enclosed that do not reflect the nature of this encounter. Every attempt is made to correct such errors.  Rex Kras, [place], [facility]  Pager: [phone] Office: [phone]

## 2022-07-30 NOTE — Interval H&P Note (Signed)
History and Physical Interval Note:  07/30/2022 7:41 AM  Michelle [name]  has presented today for surgery, with the diagnosis of HF.  The various methods of treatment have been discussed with the patient and family. After consideration of risks, benefits and other options for treatment, the patient has consented to  Procedure(s): RIGHT/LEFT HEART CATH AND CORONARY ANGIOGRAPHY (N/A) as a surgical intervention.  The patient's history has been reviewed, patient examined, no change in status, stable for surgery.  I have reviewed the patient's chart and labs.  Questions were answered to the patient's satisfaction.    2012 Appropriate Use Criteria for Diagnostic Catheterization Cardiomyopathies (Right and Left Heart Catheterization OR Right Heart Catheterization Alone With/Without Left Ventriculography and Coronary Angiography) Indication:  Known or suspected cardiomyopathy with or without heart failure A (7) Indication: 93; Score 7    Jian Hodgman J Caylen Yardley

## 2022-08-01 ENCOUNTER — Telehealth: Payer: Self-pay | Admitting: *Deleted

## 2022-08-01 ENCOUNTER — Other Ambulatory Visit: Payer: Self-pay | Admitting: Family Medicine

## 2022-08-01 DIAGNOSIS — Z1231 Encounter for screening mammogram for malignant neoplasm of breast: Secondary | ICD-10-CM

## 2022-08-01 DIAGNOSIS — E1142 Type 2 diabetes mellitus with diabetic polyneuropathy: Secondary | ICD-10-CM

## 2022-08-01 NOTE — Telephone Encounter (Signed)
Pt will need a new script for strips sent in by Faculty provider [facility]).  Directions will need to be specific, can't say as needed.  Medicare typically covers once/day testing for non-insulin and 3x daily testing for insulin dependent. Christen Bame, CMA

## 2022-08-02 MED ORDER — GLUCOSE BLOOD VI STRP: ORAL_STRIP | 12 refills | Status: DC

## 2022-08-07 NOTE — Patient Instructions (Addendum)
It was nice seeing you today!  Go to your CT scan as scheduled.  You can call to try to get this appointment moved up sooner if they have availability.  If your pain is getting worse, I would recommend that you go to the emergency department.  Take antibiotics as prescribed.  If you are feeling better, you can cancel the CT appointment.  Try to avoid fatty foods until you are feeling better.  Start with a more liquid diet and gradually work the way up to more solid foods.  Stop taking the Flexeril.  I am setting you up for our geriatric clinic. You will be getting a call to get this scheduled.  I am checking some coagulation studies and your blood work.  I have placed referral to hematology.  You are due for your Medicare annual wellness exam which is usually done by one of our nurses as a phone call.  They will be calling you to set up an appointment for this.  Follow-up with me as scheduled.  Stay well, [name], MD [place] [phone]  --  Make sure to check out at the front desk before you leave today.  Please arrive at least 15 minutes prior to your scheduled appointments.  If you had blood work today, I will send you a MyChart message or a letter if results are normal. Otherwise, I will give you a call.  If you had a referral placed, they will call you to set up an appointment. Please give [place] a call if you don't hear back in the next 2 weeks.  If you need additional refills before your next appointment, please call your pharmacy first.

## 2022-08-07 NOTE — Progress Notes (Signed)
SUBJECTIVE:   CHIEF COMPLAINT / HPI:  No chief complaint on file.   Patient recently diagnosed with HFrEF with EF 35 to 40%.  She has been reestablished with cardiology Dr. Terri Skains, did have cardiac catheterization done last week on 1/30 which did not show any significant obstructions, consistent with well compensated nonischemic cardiomyopathy.  Reports sudden onset left lower abdominal pain since 3 days ago. Having diarrhea. Denies fever. Unsure about chills. Pain not getting better. No dysuria or urinary frequency.  Denies nausea, vomiting She reported not to go to the ED.  Reports vision has been getting worse over the past few months. Has eye doctor appointment next week Monday.  Reports easy bruising which has been an ongoing problem. Daughter went to hematologist Dr. Lorenso Courier, patient asking for referral to him.  PERTINENT  PMH / PSH: HFrEF, asthma, T2DM, RA, chronic pain, HLD, GAD History of appendectomy, hysterectomy  Patient Care Team: [name], MD as PCP - General (Family Medicine) Alda Berthold, DO as Consulting Physician (Neurology)   OBJECTIVE:   BP 105/61   Pulse 82   Ht 5' 2"$  (1.575 m)   Wt 125 lb 6 oz (56.9 kg)   SpO2 97%   BMI 22.93 kg/m   Physical Exam Constitutional:      General: She is not in acute distress. HENT:     Head: Normocephalic and atraumatic.  Cardiovascular:     Rate and Rhythm: Normal rate and regular rhythm.  Pulmonary:     Effort: Pulmonary effort is normal. No respiratory distress.     Comments: Faint diffuse coarse crackles Abdominal:     Palpations: Abdomen is soft.     Comments: Moderate tenderness to palpation of the left lower quadrant and right lower quadrant, no guarding  Musculoskeletal:     Cervical back: Neck supple.     Right lower leg: No edema.     Left lower leg: No edema.  Neurological:     Mental Status: She is alert.         08/09/2022   10:25 AM  Depression screen PHQ 2/9  Decreased Interest 1   Down, Depressed, Hopeless 0  PHQ - 2 Score 1  Altered sleeping 1  Tired, decreased energy 1  Change in appetite 1  Feeling bad or failure about yourself  0  Trouble concentrating 0  Moving slowly or fidgety/restless 0  Suicidal thoughts 0  PHQ-9 Score 4  Difficult doing work/chores Somewhat difficult     {Show previous vital signs (optional):23777}    ASSESSMENT/PLAN:    1. Left lower quadrant abdominal pain Acute onset left lower quadrant pain for a few days, she has some moderate tenderness on exam but overall is benign.  No systemic signs but I think she could have possible diverticulitis based on location, will evaluate further with CT.  Unable to be scheduled in a timely manner so we will empirically treat for diverticulitis with antibiotics in the meantime given multiple comorbidities. - POCT urinalysis dipstick shows no evidence of UTI - CT Abdomen Pelvis W Contrast; currently scheduled for 2/20 - ciprofloxacin (CIPRO) 500 MG tablet; Take 1 tablet (500 mg total) by mouth 2 (two) times daily for 10 days.  Dispense: 20 tablet; Refill: 0 - metroNIDAZOLE (FLAGYL) 500 MG tablet; Take 1 tablet (500 mg total) by mouth 3 (three) times daily for 10 days.  Dispense: 30 tablet; Refill: 0 - strict ED precautions given  2. Easy bruising Discussed that this is likely  age-related but will check coagulation studies.  Referral to hematology per patient's request. - Protime-INR - Ambulatory referral to Hematology / Oncology - APTT  3. Memory change Reported memory concerns and with polypharmacy I think she would benefit from geriatric clinic, I have messaged our referral coordinator to set this up   I discussed her diagnosis of heart failure in depth.  [name], MD [place]

## 2022-08-08 DIAGNOSIS — I5020 Unspecified systolic (congestive) heart failure: Secondary | ICD-10-CM | POA: Diagnosis not present

## 2022-08-08 DIAGNOSIS — M059 Rheumatoid arthritis with rheumatoid factor, unspecified: Secondary | ICD-10-CM | POA: Diagnosis not present

## 2022-08-08 DIAGNOSIS — M153 Secondary multiple arthritis: Secondary | ICD-10-CM | POA: Diagnosis not present

## 2022-08-08 DIAGNOSIS — Z79899 Other long term (current) drug therapy: Secondary | ICD-10-CM | POA: Diagnosis not present

## 2022-08-08 DIAGNOSIS — Z5181 Encounter for therapeutic drug level monitoring: Secondary | ICD-10-CM | POA: Diagnosis not present

## 2022-08-09 ENCOUNTER — Encounter: Payer: Self-pay | Admitting: Family Medicine

## 2022-08-09 ENCOUNTER — Telehealth: Payer: Self-pay | Admitting: Hematology and Oncology

## 2022-08-09 ENCOUNTER — Ambulatory Visit (INDEPENDENT_AMBULATORY_CARE_PROVIDER_SITE_OTHER): Payer: 59 | Admitting: Family Medicine

## 2022-08-09 VITALS — BP 105/61 | HR 82 | Ht 62.0 in | Wt 125.4 lb

## 2022-08-09 DIAGNOSIS — R413 Other amnesia: Secondary | ICD-10-CM

## 2022-08-09 DIAGNOSIS — R1032 Left lower quadrant pain: Secondary | ICD-10-CM

## 2022-08-09 DIAGNOSIS — R233 Spontaneous ecchymoses: Secondary | ICD-10-CM | POA: Diagnosis not present

## 2022-08-09 LAB — POCT URINALYSIS DIP (MANUAL ENTRY)
Bilirubin, UA: NEGATIVE
Blood, UA: NEGATIVE
Glucose, UA: 500 mg/dL — AB
Ketones, POC UA: NEGATIVE mg/dL
Leukocytes, UA: NEGATIVE
Nitrite, UA: NEGATIVE
Protein Ur, POC: NEGATIVE mg/dL
Spec Grav, UA: 1.015 (ref 1.010–1.025)
Urobilinogen, UA: 0.2 E.U./dL
pH, UA: 7 (ref 5.0–8.0)

## 2022-08-09 MED ORDER — METRONIDAZOLE 500 MG PO TABS: 500.0000 mg | ORAL_TABLET | Freq: Three times a day (TID) | ORAL | 0 refills | Status: AC

## 2022-08-09 MED ORDER — CIPROFLOXACIN HCL 500 MG PO TABS: 500.0000 mg | ORAL_TABLET | Freq: Two times a day (BID) | ORAL | 0 refills | Status: AC

## 2022-08-09 NOTE — Telephone Encounter (Signed)
Scheduled appt per 2/9 referral. Pt is aware of appt date and time. Pt is aware to arrive 15 mins prior to appt time and to bring and updated insurance card. Pt is aware of appt location.

## 2022-08-10 ENCOUNTER — Other Ambulatory Visit: Payer: Self-pay | Admitting: Family Medicine

## 2022-08-10 LAB — PROTIME-INR
INR: 1 (ref 0.9–1.2)
Prothrombin Time: 10.8 s (ref 9.1–12.0)

## 2022-08-10 LAB — APTT: aPTT: 25 s (ref 24–33)

## 2022-08-11 ENCOUNTER — Other Ambulatory Visit: Payer: Self-pay | Admitting: Student

## 2022-08-11 NOTE — Progress Notes (Incomplete)
Assessment/Plan:   Memory Difficulties  Michelle [name] is a very pleasant 72 y.o. RH female presenting today in follow-up for evaluation of memory loss. Last Neuropsych evaluation on 7/21 was normal.She declines repeat Neuropsych testing unless her memory worsens. Patient is not on antidementia medication and it is not indicated at this time. MoCA today is 29/30, stable from the cognitive standpoint.      Recommendations:   Follow up in 1 year  Continue to control mood as per PCP, patient on Zoloft  Recommend good control of cardiovascular risk factors.   Recommend limit of narcotics and muscle relaxers Recommend good COPD management  Continue B12 supplements Follow up anemia as per hematology  Recommend discussing with PCP the use of omeprazole (she is taking it bid) and this medicine if taken over a prior of greater than 3 years can contribute to memory difficulties  No indication for antidementia meds at this time   Essential Tremors  She has been evaluated by Dr. Carles Collet in the past, last seem on 02/08/22, with last increase of primidone 6 months prior at 50 mg 2/1/1, but did not find the medicine helpful, and she is off of it now  On exam, minimal L intention tremor (she reports it is better than prior)  otherwise no other tremors noted. Patient aware that COPD meds can cause tremors . No further   B12 deficiency Continue B12 supplementation   Chronic back pain with L foot numbness   had surgery Aug 29, L3-L4 , no significant improvement.  She had a recent NCS without evidence of RCP neuropathy or large fiber sensorimotor neuropathy.  She has chronic lumbar radiculopathy  R L3-L4 nerve root segment, mild.  Patient to discuss with Neurosurgeon PT/OT and pain clinic issues Continue gabapentin 300 mg  qhs or as directed by neurosurgery  Recommend cane for stability    Subjective:   This patient is here alone. Previous records as well as any outside records available were  reviewed prior to todays visit.  Patient was last seen on 02/08/22     Any changes in memory since last visit? No significant changes. Patient denies any difficulty remembering recent conversations and people's names. "A 3 out of 10". She denies any confusion. Likes to read.  repeats oneself?  Endorsed, "my husband tells me that I do".  Disoriented when walking into a room?  Patient denies except occasionally not remembering what patient came to the room for    Leaving objects in unusual places?  Patient denies   Wandering behavior?   denies   Any personality changes since last visit?   denies   Any worsening depression?: "A little, since I found out I have CHF and recent diagnosis of diverticulitis and I don't like being on medications " Hallucinations or paranoia?  denies   Seizures?   denies    Any sleep changes?  Endorsed," have a hard time falling asleep, going to try melatonin" Denies vivid dreams, REM behavior or sleepwalking . She has RLS   Sleep apnea?   denies   Any hygiene concerns?   denies   Independent of bathing and dressing?  Endorsed  Does the patient needs help with medications? Patient is in charge   Who is in charge of the finances? Her and her husband do finances together   Any changes in appetite?  Decreased since her last back surgery, but I snack "I admit my sugar is up and need to take care of it"  Patient have trouble swallowing?  denies   Does the patient cook? Yes  Any kitchen accidents such as leaving the stove on?   denies   Any headaches?    denies   Vision changes? Needs new glasses, has an appt with ophthalmologist today  Chronic back pain, had surgery Aug 29, L3-L4 , no significant improvement. She had a recent NCS without evidence of RCP neuropathy or large fiber sensorimotor neuropathy. She has chronic lumbar radiculopathy  R L3-L4 nerve root segment, mild.  She follows neurosurgery and is interested in PT/OT with them as well as addressing her pain issues (  pain clinic)  Ambulates with difficulty?  Still will hit the steps of the staircase because the "L foot tingles, drops", causing mechanical falls at times . No head injuries or LOC  Any tremors?  Her mild L hand intention tremor is "better". No other areas of tremor.  Any anosmia?    denies   Any incontinence of urine? "Once in a while I have to run but not too often" Any bowel dysfunction? "I have diverticulitis, dealing with it, I am on 2 medicines"  She reports frequent  diarrhea and intermittent constipation.  Patient lives   Husband and 79 yo granddaughter and greatgrandaughter  Does the patient drive? No issues driving, denies getting lost   Past Medical History:  Diagnosis Date   Actinic keratosis    Allergic rhinitis 08/28/2006   Anemia    low iron   Anginal pain (HCC)    Asthma, chronic 03/14/2010   Does seem to use her albuterol excessively. Consider change to more manageable medicines and treat more like COPD in the future.   Carpal tunnel syndrome, left 05/29/2009   Last Assessment & Plan:  Advised to wear wrist brace at night when she is sleeping.  If this does not help will consider having her go to sports medicine.   Chronic frontal sinusitis 08/28/2016   Chronic left maxillary sinusitis 08/28/2016   Diverticulosis    Essential hypertension 10/30/2012   Gastroparesis 11/19/2011   DM and chronic narcotics contribute.  Causes dysphagia like symptoms    Generalized anxiety disorder 08/28/2006   GERD (gastroesophageal reflux disease)    Hearing loss 07/19/2015   Hearing aid in right ear   History of cataracts, bilateral    Hyperlipidemia 08/28/2006   Lumbar radiculopathy 04/07/2018   Lung nodules    Osteoporosis 08/08/2011   Pain in joint, pelvic region and thigh 11/18/2012   Radial styloid tenosynovitis 08/15/2013   Restless leg syndrome 05/14/2012   Seropositive rheumatoid arthritis 01/08/2010   Patient is being followed by rheumatologist at [facility] patient is on Plaquenil and as well as prednisone.   Changed to methotrexate and prednisone in May of 2013  Patient does have a pain contract here with [facility] receives oxycodone 10/325 mg tabs every 6 hours. This has been titrated down from significant doses of methadone previously. Could attempt to tit   Shoulder joint pain 03/25/2011   The patient is compensating due to her not being able to use her left arm right now rotator cuff impingement if he continues to worsen would not be surprised if she does get a tear on this side as well   Squamous cell carcinoma in situ of skin 11/25/2014   Referred to dermatology for excision    Squamous cell carcinoma of skin 12/26/2014   Right distal pretibial. SCC-KA pattern.   Squamous cell carcinoma of  skin 03/27/2015   Right inf. lat. knee. SCC-KA pattern.    Squamous cell carcinoma of skin 10/07/2016   Left lateral calf superior. WD SCC.   Squamous cell carcinoma of skin 10/07/2016   Left lateral calf inferior. WD SCC.   Squamous cell carcinoma of skin 04/28/2018   Left mid med. pretibial. WD SCC.   Squamous cell carcinoma of skin 08/12/2019   Left mid med. pretibial sup. KA type. EDC   Squamous cell carcinoma of skin 08/12/2019   Left mid med. pretibial inferior. WD. EDC.   Tear of medial meniscus of knee, left 04/27/2009   Type II diabetes mellitus 08/28/2006   Overview:  Patient has her ups and downs, go in line with flares of her RA and prednisone uses.  Patient does have hx of hypoglycemic events so would not try for perfect control but should have A1c goal around 7.0 Lab Results  Component Value Date   HGBA1C 6.9 12/27/2011   Last Assessment & Plan:  At goal. Will continue current metformin     Past Surgical History:  Procedure Laterality Date   ABDOMINAL HYSTERECTOMY     APPENDECTOMY     BACK SURGERY     BIOPSY  05/10/2021   Procedure: BIOPSY;  Surgeon: Milus Banister, MD;  Location: [facility] ENDOSCOPY;  Service:  Endoscopy;;  EGD and COLON   CARPAL TUNNEL RELEASE Right    CATARACT EXTRACTION Left    with lid lift   CHOLECYSTECTOMY     COLONOSCOPY     COLONOSCOPY WITH PROPOFOL N/A 05/10/2021   Procedure: COLONOSCOPY WITH PROPOFOL;  Surgeon: Milus Banister, MD;  Location: [facility] ENDOSCOPY;  Service: Endoscopy;  Laterality: N/A;   ESOPHAGOGASTRODUODENOSCOPY (EGD) WITH PROPOFOL N/A 05/10/2021   Procedure: ESOPHAGOGASTRODUODENOSCOPY (EGD) WITH PROPOFOL;  Surgeon: Milus Banister, MD;  Location: [facility];  Service: Endoscopy;  Laterality: N/A;   HERNIA REPAIR     KNEE SURGERY Right    x2- cartilage annd scar tissue   LEFT AND RIGHT HEART CATHETERIZATION WITH CORONARY ANGIOGRAM N/A 08/17/2013   Procedure: LEFT AND RIGHT HEART CATHETERIZATION WITH CORONARY ANGIOGRAM;  Surgeon: Laverda Page, MD;  Location: [facility] CATH LAB;  Service: Cardiovascular;  Laterality: N/A;   lumbar back surgery  04/04/2018   done by neurosurgeron   right foot bone spurs removed     RIGHT/LEFT HEART CATH AND CORONARY ANGIOGRAPHY N/A 07/30/2022   Procedure: RIGHT/LEFT HEART CATH AND CORONARY ANGIOGRAPHY;  Surgeon: Nigel Mormon, MD;  Location: [place] CV LAB;  Service: Cardiovascular;  Laterality: N/A;   ROTATOR CUFF REPAIR Right    SHOULDER SURGERY Left    x2- rotatar cuff tear   UPPER GASTROINTESTINAL ENDOSCOPY       PREVIOUS MEDICATIONS:   CURRENT MEDICATIONS:  Outpatient Encounter Medications as of 08/12/2022  Medication Sig   Accu-Chek Softclix Lancets lancets Please use to check blood sugar up to two times per day. E11.42   acetaminophen (TYLENOL) 650 MG CR tablet Take 650 mg by mouth every 8 (eight) hours as needed for pain.   aspirin EC 81 MG tablet Take 1 tablet (81 mg total) by mouth daily. Swallow whole.   betamethasone dipropionate 0.05 % lotion Apply topically as directed. Apply to the scalp daily up to 5 days for scalp (Patient taking differently: Apply 1 application  topically daily as needed  (scalp irritation). Apply to the scalp daily up to 5 days for scalp)   Blood Glucose Monitoring Suppl (ACCU-CHEK GUIDE) w/Device KIT Please  use to check blood sugar up to two times per day. E11.42   ciprofloxacin (CIPRO) 500 MG tablet Take 1 tablet (500 mg total) by mouth 2 (two) times daily for 10 days.   clobetasol (TEMOVATE) 0.05 % external solution Apply 1 application  topically daily. To scalp up to 5 times per week (Patient taking differently: Apply 1 application  topically daily as needed (scalp irritation.). To scalp up to 5 times per week)   cromolyn (OPTICROM) 4 % ophthalmic solution Place 1 drop into both eyes 3 (three) times daily as needed (itchy eyes).   Dulaglutide 0.75 MG/0.5ML SOPN Inject 0.75 mg into the skin once a week. (Patient taking differently: Inject 0.75 mg into the skin every Wednesday.)   empagliflozin (JARDIANCE) 10 MG TABS tablet Take 1 tablet (10 mg total) by mouth daily.   fluticasone (FLONASE) 50 MCG/ACT nasal spray Place 2 sprays into both nostrils daily as needed for allergies.   fluticasone furoate-vilanterol (BREO ELLIPTA) 100-25 MCG/ACT AEPB TAKE 1 PUFF BY MOUTH EVERY DAY   gabapentin (NEURONTIN) 300 MG capsule TAKE 1 CAPSULE BY MOUTH EVERYDAY AT BEDTIME Strength: 300 mg (Patient taking differently: Take 300 mg by mouth at bedtime.)   glucose blood test strip Use as instructed a few times a week as needed   glucose blood test strip Use to check blood glucose 2 times a day as needed   hydrOXYzine (ATARAX) 25 MG tablet TAKE 1-2 TABLETS AT BEDTIME AS NEEDED FOR ITCH. MAY MAKE DROWSY - DO NOT DRIVE AFTER TAKING   inFLIXimab (REMICADE) 100 MG injection Inject 100 mg into the vein every 7 (seven) weeks.    leflunomide (ARAVA) 10 MG tablet Take 10 mg by mouth daily.   loperamide (IMODIUM) 2 MG capsule Take 2 mg by mouth daily as needed for diarrhea or loose stools.   metFORMIN (GLUCOPHAGE-XR) 500 MG 24 hr tablet TAKE 2 TABLETS BY MOUTH TWICE A DAY   metoprolol  succinate (TOPROL-XL) 25 MG 24 hr tablet Take 1 tablet (25 mg total) by mouth at bedtime.   metroNIDAZOLE (FLAGYL) 500 MG tablet Take 1 tablet (500 mg total) by mouth 3 (three) times daily for 10 days.   mirabegron ER (MYRBETRIQ) 50 MG TB24 tablet Take 50 mg by mouth in the morning.   mometasone (ELOCON) 0.1 % cream APPLY 1 APPLICATION TOPICALLY DAILY AS NEEDED (RASH). (Patient taking differently: Apply 1 Application topically daily as needed (rash).)   montelukast (SINGULAIR) 10 MG tablet TAKE 1 TABLET BY MOUTH EVERY DAY (Patient taking differently: Take 10 mg by mouth at bedtime.)   mupirocin ointment (BACTROBAN) 2 % APPLY TOPICALLY ONCE DAILY TO OPEN SORES ON ARMS, LEGS AND TOES UNTIL HEALED. THEN APPLY TOPICALLY TO AFFECTED AREAS AS NEEDED FOR FLARES   nitroGLYCERIN (NITROSTAT) 0.4 MG SL tablet Place 1 tablet (0.4 mg total) under the tongue every 5 (five) minutes as needed.   omeprazole (PRILOSEC) 40 MG capsule Take 1 capsule (40 mg total) by mouth 2 (two) times daily. (Patient taking differently: Take 40 mg by mouth in the morning.)   oxyCODONE-acetaminophen (PERCOCET) 10-325 MG tablet Take 1 tablet by mouth every 6 (six) hours as needed for pain.   rosuvastatin (CRESTOR) 10 MG tablet Take 1 tablet (10 mg total) by mouth daily. (Patient taking differently: Take 10 mg by mouth every evening.)   sacubitril-valsartan (ENTRESTO) 24-26 MG Take 1 tablet by mouth 2 (two) times daily.   sertraline (ZOLOFT) 50 MG tablet Take 1 tablet (50 mg total) by  mouth daily. (Patient taking differently: Take 50 mg by mouth at bedtime.)   VENTOLIN HFA 108 (90 Base) MCG/ACT inhaler INHALE 2 PUFFS BY MOUTH EVERY 4 HOURS AS NEEDED FOR WHEEZING OR SHORTNESS OF BREATH   [DISCONTINUED] acetaminophen (TYLENOL) 500 MG tablet Take 1,000 mg by mouth every 6 (six) hours as needed for moderate pain or headache.   [DISCONTINUED] Ascorbic Acid (VITAMIN C) 1000 MG tablet Take 1,000 mg by mouth daily.   [DISCONTINUED] cephALEXin  (KEFLEX) 500 MG capsule Take 1 capsule (500 mg total) by mouth 2 (two) times daily. (Patient not taking: Reported on 07/25/2022)   [DISCONTINUED] chlorhexidine (PERIDEX) 0.12 % solution Use as directed 15 mLs in the mouth or throat 2 (two) times daily. (Patient taking differently: Use as directed 15 mLs in the mouth or throat 2 (two) times daily as needed (mouth sores).)   [DISCONTINUED] cyclobenzaprine (FLEXERIL) 10 MG tablet Take 10 mg by mouth 3 (three) times daily as needed for muscle spasms.   [DISCONTINUED] glucose blood test strip Use to check blood glucose 2 times a day as needed   [DISCONTINUED] vitamin B-12 (CYANOCOBALAMIN) 1000 MCG tablet Take 1,000 mcg by mouth daily.   No facility-administered encounter medications on file as of 08/12/2022.     Objective:     PHYSICAL EXAMINATION:    VITALS:   Vitals:   08/12/22 0827  BP: 120/72  Pulse: 80  Resp: 18  SpO2: 96%  Weight: 125 lb (56.7 kg)  Height: 5' 2"$  (1.575 m)    GEN:  The patient appears stated age and is in NAD. HEENT:  Normocephalic, atraumatic.   Neurological examination:  General: NAD, well-groomed, appears stated age. Orientation: The patient is alert. Oriented to person, place and date Cranial nerves: There is good facial symmetry.The speech is fluent and clear. No aphasia or dysarthria. Fund of knowledge is appropriate. Recent memory impaired and remote memory is normal.  Attention and concentration are normal.  Able to name objects and repeat phrases.  Hearing is intact to conversational tone.   Delayed recall 5/5 Sensation: Sensation is intact to light touch throughout Motor: Strength is at least antigravity x4. Tremors: none  DTR's 2/4 in UE/LE      12/11/2018   11:33 AM  Montreal Cognitive Assessment   Visuospatial/ Executive (0/5) 2  Naming (0/3) 3  Attention: Read list of digits (0/2) 2  Attention: Read list of letters (0/1) 0  Attention: Serial 7 subtraction starting at 100 (0/3) 1  Language:  Repeat phrase (0/2) 1  Language : Fluency (0/1) 1  Abstraction (0/2) 2  Delayed Recall (0/5) 5  Orientation (0/6) 5  Total 22  Adjusted Score (based on education) 23        No data to display             Movement examination: Tone: There is normal tone in the UE/LE Abnormal movements:  L mild intention tremor, no resting or postural tremors.   No myoclonus.  No asterixis.   Coordination:  There is no decremation with RAM's. Normal finger to nose  Gait and Station: The patient has no difficulty arising out of a deep-seated chair without the use of the hands. The patient's stride length is good.  Gait is cautious and narrow.   Thank you for allowing [place] the opportunity to participate in the care of this nice patient. Please do not hesitate to contact us for any questions or concerns.   Total time spent on today's visit was  61 minutes dedicated to this patient today, preparing to see patient, examining the patient, ordering tests and/or medications and counseling the patient, documenting clinical information in the EHR or other health record, independently interpreting results and communicating results to the patient/family, discussing treatment and goals, answering patient's questions and coordinating care.  Cc:  [name], MD  Sharene Butters 08/12/2022 10:47 AM

## 2022-08-12 ENCOUNTER — Ambulatory Visit (INDEPENDENT_AMBULATORY_CARE_PROVIDER_SITE_OTHER): Payer: 59 | Admitting: Physician Assistant

## 2022-08-12 ENCOUNTER — Encounter: Payer: Self-pay | Admitting: Physician Assistant

## 2022-08-12 VITALS — BP 120/72 | HR 80 | Resp 18 | Ht 62.0 in | Wt 125.0 lb

## 2022-08-12 DIAGNOSIS — R251 Tremor, unspecified: Secondary | ICD-10-CM

## 2022-08-12 DIAGNOSIS — M5416 Radiculopathy, lumbar region: Secondary | ICD-10-CM | POA: Diagnosis not present

## 2022-08-12 DIAGNOSIS — R413 Other amnesia: Secondary | ICD-10-CM | POA: Diagnosis not present

## 2022-08-12 NOTE — Patient Instructions (Addendum)
  Follow up in 1 year  Recommend good control of cardiovascular risk factors.    Recommend discussing with primary doctor  the use of omeprazole  this medicine if taken over a prior of greater than 3 years can contribute to memory difficulties  Follow up with neurosurgery on chronic back pain Continue B12 and monitor anemia

## 2022-08-13 ENCOUNTER — Telehealth: Payer: Self-pay | Admitting: *Deleted

## 2022-08-13 MED ORDER — OXYCODONE-ACETAMINOPHEN 10-325 MG PO TABS: 1.0000 | ORAL_TABLET | Freq: Four times a day (QID) | ORAL | 0 refills | Status: DC | PRN

## 2022-08-13 NOTE — Telephone Encounter (Signed)
Pt states that Dr. Nancy Fetter told her that he sent in a script for her oxycodone refill, but [facility] says they do not have it.   Looking back in chart, I see our last fill was in December.  Will forward to MD for refill if appropriate.  Christen Bame, CMA

## 2022-08-13 NOTE — Telephone Encounter (Signed)
1 month refill sent in, will send in further refills at upcoming follow-up visit.  It looks like February prescription was accidentally canceled by another office.

## 2022-08-14 ENCOUNTER — Telehealth: Payer: Self-pay

## 2022-08-14 DIAGNOSIS — M5416 Radiculopathy, lumbar region: Secondary | ICD-10-CM | POA: Diagnosis not present

## 2022-08-14 MED ORDER — FLUCONAZOLE 150 MG PO TABS: 150.0000 mg | ORAL_TABLET | Freq: Once | ORAL | 0 refills | Status: AC

## 2022-08-14 NOTE — Telephone Encounter (Signed)
Patient calls nurse line requesting a prescription for Diflucan.   She reports she was given an antibiotic for diverticulitis, however reports she always gets a yeast infection afterwards.  She reports she would like to have #2 Diflucan on hand.   Will forward to PCP.

## 2022-08-14 NOTE — Telephone Encounter (Signed)
Patient has been updated.

## 2022-08-14 NOTE — Telephone Encounter (Signed)
1 tablet fluconazole sent in.  She can take this after her antibiotic course if needed.  She should only need 1 tablet.

## 2022-08-16 ENCOUNTER — Ambulatory Visit: Payer: 59 | Admitting: Cardiology

## 2022-08-16 ENCOUNTER — Other Ambulatory Visit: Payer: Self-pay | Admitting: Cardiology

## 2022-08-16 ENCOUNTER — Encounter: Payer: Self-pay | Admitting: Cardiology

## 2022-08-16 VITALS — BP 101/66 | HR 77 | Resp 15 | Ht 62.0 in | Wt 125.0 lb

## 2022-08-16 DIAGNOSIS — E785 Hyperlipidemia, unspecified: Secondary | ICD-10-CM | POA: Diagnosis not present

## 2022-08-16 DIAGNOSIS — I5022 Chronic systolic (congestive) heart failure: Secondary | ICD-10-CM

## 2022-08-16 DIAGNOSIS — I428 Other cardiomyopathies: Secondary | ICD-10-CM | POA: Diagnosis not present

## 2022-08-16 DIAGNOSIS — I700 Atherosclerosis of aorta: Secondary | ICD-10-CM | POA: Diagnosis not present

## 2022-08-16 DIAGNOSIS — Z87891 Personal history of nicotine dependence: Secondary | ICD-10-CM

## 2022-08-16 DIAGNOSIS — E1169 Type 2 diabetes mellitus with other specified complication: Secondary | ICD-10-CM

## 2022-08-16 DIAGNOSIS — E119 Type 2 diabetes mellitus without complications: Secondary | ICD-10-CM | POA: Diagnosis not present

## 2022-08-16 DIAGNOSIS — I2510 Atherosclerotic heart disease of native coronary artery without angina pectoris: Secondary | ICD-10-CM

## 2022-08-16 DIAGNOSIS — I10 Essential (primary) hypertension: Secondary | ICD-10-CM | POA: Diagnosis not present

## 2022-08-16 DIAGNOSIS — I429 Cardiomyopathy, unspecified: Secondary | ICD-10-CM | POA: Diagnosis not present

## 2022-08-16 MED ORDER — METOPROLOL SUCCINATE ER 25 MG PO TB24: 25.0000 mg | ORAL_TABLET | Freq: Every day | ORAL | 0 refills | Status: DC

## 2022-08-16 MED ORDER — ENTRESTO 24-26 MG PO TABS: 1.0000 | ORAL_TABLET | Freq: Two times a day (BID) | ORAL | 0 refills | Status: DC

## 2022-08-16 NOTE — Progress Notes (Signed)
ID:  Michelle [name], DOB May 23, 1951, MRN [id number]  PCP:  [name], MD  Cardiologist:  Rex Kras, DO, [facility] (established care 07/22/2022) Former Cardiology Providers: Dr. Einar Gip  Date: 08/16/22 Last Office Visit: 07/22/2022  Chief Complaint  Patient presents with   HFrEF   Follow-up    4 week    HPI  Michelle [name] is a 72 y.o. Caucasian female whose past medical history and cardiovascular risk factors include: Nonischemic cardiomyopathy, chronic HFrEF, coronary artery calcification (CT scan care everywhere), aortic atherosclerosis, former smoker, non-insulin-dependent diabetes mellitus type 2, hyperlipidemia, hypertension.   Patient is accompanied by her daughter at today's office visit.  She was referred to the practice for evaluation and management of chronic HFrEF.  Since last office visit she is undergone a left and right heart catheterization results reviewed with them in great detail.  In summary she has compensated nonischemic cardiomyopathy.  She is tolerated the initiation of Entresto well without any side effects or intolerances, despite her allergy list.  Follow-up labs to reevaluate kidney function after initiation of Entresto still pending.  Her weight remains stable.  She denies anginal discomfort or heart failure symptoms.   She does not check her blood pressures at home.   And she is currently not taking Jardiance due to multiple yeast infections in the past   FUNCTIONAL STATUS: No structured exercise program or daily routine.   ALLERGIES: Allergies  Allergen Reactions   Zofran [Ondansetron Hcl] Other (See Comments)    Prolong QT   Ace Inhibitors Cough    Occurred with lisinopril   Levofloxacin Other (See Comments)    Felt things on her legs that were not there,made her stomach hurt   Lisinopril Cough   Sulfonamide Derivatives Hives and Itching   Sulfa Antibiotics     Reaction:  Other reaction(s): ITCHING    MEDICATION LIST PRIOR TO  VISIT: Current Meds  Medication Sig   Accu-Chek Softclix Lancets lancets Please use to check blood sugar up to two times per day. E11.42   acetaminophen (TYLENOL) 650 MG CR tablet Take 650 mg by mouth every 8 (eight) hours as needed for pain.   aspirin EC 81 MG tablet Take 1 tablet (81 mg total) by mouth daily. Swallow whole.   betamethasone dipropionate 0.05 % lotion Apply topically as directed. Apply to the scalp daily up to 5 days for scalp (Patient taking differently: Apply 1 application  topically daily as needed (scalp irritation). Apply to the scalp daily up to 5 days for scalp)   Blood Glucose Monitoring Suppl (ONE TOUCH ULTRA 2) w/Device KIT USE AS DIRECTED   ciprofloxacin (CIPRO) 500 MG tablet Take 1 tablet (500 mg total) by mouth 2 (two) times daily for 10 days.   clobetasol (TEMOVATE) 0.05 % external solution Apply 1 application  topically daily. To scalp up to 5 times per week (Patient taking differently: Apply 1 application  topically daily as needed (scalp irritation.). To scalp up to 5 times per week)   cromolyn (OPTICROM) 4 % ophthalmic solution Place 1 drop into both eyes 3 (three) times daily as needed (itchy eyes).   diclofenac Sodium (VOLTAREN) 1 % GEL Apply 2 g topically 4 (four) times daily.   Dulaglutide 0.75 MG/0.5ML SOPN Inject 0.75 mg into the skin once a week. (Patient taking differently: Inject 0.75 mg into the skin every Wednesday.)   fluticasone (FLONASE) 50 MCG/ACT nasal spray Place 2 sprays into both nostrils daily as needed for allergies.  fluticasone furoate-vilanterol (BREO ELLIPTA) 100-25 MCG/ACT AEPB TAKE 1 PUFF BY MOUTH EVERY DAY   gabapentin (NEURONTIN) 300 MG capsule TAKE 1 CAPSULE BY MOUTH EVERYDAY AT BEDTIME   glucose blood test strip Use as instructed a few times a week as needed   glucose blood test strip Use to check blood glucose 2 times a day as needed   leflunomide (ARAVA) 10 MG tablet Take 10 mg by mouth daily.   metFORMIN (GLUCOPHAGE-XR) 500 MG  24 hr tablet TAKE 2 TABLETS BY MOUTH TWICE A DAY   metroNIDAZOLE (FLAGYL) 500 MG tablet Take 1 tablet (500 mg total) by mouth 3 (three) times daily for 10 days.   mirabegron ER (MYRBETRIQ) 50 MG TB24 tablet Take 50 mg by mouth in the morning.   mometasone (ELOCON) 0.1 % cream APPLY 1 APPLICATION TOPICALLY DAILY AS NEEDED (RASH). (Patient taking differently: Apply 1 Application topically daily as needed (rash).)   mometasone-formoterol (DULERA) 100-5 MCG/ACT AERO Inhale 2 puffs into the lungs 2 (two) times daily.   montelukast (SINGULAIR) 10 MG tablet TAKE 1 TABLET BY MOUTH EVERY DAY (Patient taking differently: Take 10 mg by mouth at bedtime.)   mupirocin ointment (BACTROBAN) 2 % APPLY TOPICALLY ONCE DAILY TO OPEN SORES ON ARMS, LEGS AND TOES UNTIL HEALED. THEN APPLY TOPICALLY TO AFFECTED AREAS AS NEEDED FOR FLARES   nitroGLYCERIN (NITROSTAT) 0.4 MG SL tablet Place 1 tablet (0.4 mg total) under the tongue every 5 (five) minutes as needed.   omeprazole (PRILOSEC) 40 MG capsule Take 1 capsule (40 mg total) by mouth 2 (two) times daily. (Patient taking differently: Take 40 mg by mouth in the morning.)   oxyCODONE-acetaminophen (PERCOCET) 10-325 MG tablet Take 1 tablet by mouth every 6 (six) hours as needed for pain.   rosuvastatin (CRESTOR) 10 MG tablet Take 1 tablet (10 mg total) by mouth daily. (Patient taking differently: Take 10 mg by mouth every evening.)   sertraline (ZOLOFT) 50 MG tablet Take 1 tablet (50 mg total) by mouth daily.   VENTOLIN HFA 108 (90 Base) MCG/ACT inhaler INHALE 2 PUFFS BY MOUTH EVERY 4 HOURS AS NEEDED FOR WHEEZING OR SHORTNESS OF BREATH   [DISCONTINUED] ENTRESTO 24-26 MG Take 1 tablet by mouth twice daily   [DISCONTINUED] metoprolol succinate (TOPROL-XL) 25 MG 24 hr tablet Take 1 tablet (25 mg total) by mouth at bedtime.     PAST MEDICAL HISTORY: Past Medical History:  Diagnosis Date   Actinic keratosis    Allergic rhinitis 08/28/2006   Anemia    low iron   Anginal  pain ([place])    Asthma, chronic 03/14/2010   Does seem to use her albuterol excessively. Consider change to more manageable medicines and treat more like COPD in the future.   Carpal tunnel syndrome, left 05/29/2009   Last Assessment & Plan:  Advised to wear wrist brace at night when she is sleeping.  If this does not help will consider having her go to sports medicine.   Chronic frontal sinusitis 08/28/2016   Chronic left maxillary sinusitis 08/28/2016   Diverticulosis    Essential hypertension 10/30/2012   Gastroparesis 11/19/2011   DM and chronic narcotics contribute.  Causes dysphagia like symptoms    Generalized anxiety disorder 08/28/2006   GERD (gastroesophageal reflux disease)    Hearing loss 07/19/2015   Hearing aid in right ear   History of cataracts, bilateral    Hyperlipidemia 08/28/2006   Lumbar radiculopathy 04/07/2018   Lung nodules    Osteoporosis 08/08/2011  Pain in joint, pelvic region and thigh 11/18/2012   Radial styloid tenosynovitis 08/15/2013   Restless leg syndrome 05/14/2012   Seropositive rheumatoid arthritis 01/08/2010   Patient is being followed by [facility]ologist at [facility] patient is on Plaquenil and as well as prednisone.   Changed to methotrexate and prednisone in May of 2013  Patient does have a pain contract here with [facility] receives oxycodone 10/325 mg tabs every 6 hours. This has been titrated down from significant doses of methadone previously. Could attempt to tit   Shoulder joint pain 03/25/2011   The patient is compensating due to her not being able to use her left arm right now rotator cuff impingement if he continues to worsen would not be surprised if she does get a tear on this side as well   Squamous cell carcinoma in situ of skin 11/25/2014   Referred to dermatology for excision    Squamous cell carcinoma of skin 12/26/2014   Right distal pretibial. SCC-KA pattern.   Squamous cell carcinoma of skin  03/27/2015   Right inf. lat. knee. SCC-KA pattern.    Squamous cell carcinoma of skin 10/07/2016   Left lateral calf superior. WD SCC.   Squamous cell carcinoma of skin 10/07/2016   Left lateral calf inferior. WD SCC.   Squamous cell carcinoma of skin 04/28/2018   Left mid med. pretibial. WD SCC.   Squamous cell carcinoma of skin 08/12/2019   Left mid med. pretibial sup. KA type. EDC   Squamous cell carcinoma of skin 08/12/2019   Left mid med. pretibial inferior. WD. EDC.   Tear of medial meniscus of knee, left 04/27/2009   Type II diabetes mellitus 08/28/2006   Overview:  Patient has her ups and downs, go in line with flares of her RA and prednisone uses.  Patient does have hx of hypoglycemic events so would not try for perfect control but should have A1c goal around 7.0 Lab Results  Component Value Date   HGBA1C 6.9 12/27/2011   Last Assessment & Plan:  At goal. Will continue current metformin    PAST SURGICAL HISTORY: Past Surgical History:  Procedure Laterality Date   ABDOMINAL HYSTERECTOMY     APPENDECTOMY     BACK SURGERY     BIOPSY  05/10/2021   Procedure: BIOPSY;  Surgeon: Milus Banister, MD;  Location: [facility];  Service: Endoscopy;;  EGD and COLON   CARPAL TUNNEL RELEASE Right    CATARACT EXTRACTION Left    with lid lift   CHOLECYSTECTOMY     COLONOSCOPY     COLONOSCOPY WITH PROPOFOL N/A 05/10/2021   Procedure: COLONOSCOPY WITH PROPOFOL;  Surgeon: Milus Banister, MD;  Location: [facility];  Service: Endoscopy;  Laterality: N/A;   ESOPHAGOGASTRODUODENOSCOPY (EGD) WITH PROPOFOL N/A 05/10/2021   Procedure: ESOPHAGOGASTRODUODENOSCOPY (EGD) WITH PROPOFOL;  Surgeon: Milus Banister, MD;  Location: [facility];  Service: Endoscopy;  Laterality: N/A;   HERNIA REPAIR     KNEE SURGERY Right    x2- cartilage annd scar tissue   LEFT AND RIGHT HEART CATHETERIZATION WITH CORONARY ANGIOGRAM N/A 08/17/2013   Procedure: LEFT AND RIGHT HEART CATHETERIZATION WITH CORONARY  ANGIOGRAM;  Surgeon: Laverda Page, MD;  Location: [facility] CATH LAB;  Service: Cardiovascular;  Laterality: N/A;   lumbar back surgery  04/04/2018   done by neurosurgeron   right foot bone spurs removed     RIGHT/LEFT HEART CATH AND CORONARY ANGIOGRAPHY N/A 07/30/2022   Procedure: RIGHT/LEFT  HEART CATH AND CORONARY ANGIOGRAPHY;  Surgeon: Nigel Mormon, MD;  Location: [facility];  Service: Cardiovascular;  Laterality: N/A;   ROTATOR CUFF REPAIR Right    SHOULDER SURGERY Left    x2- rotatar cuff tear   UPPER GASTROINTESTINAL ENDOSCOPY      FAMILY HISTORY: The patient family history includes Bell's palsy in her maternal grandmother; COPD in her sister; Cancer in her sister; Colon cancer in her daughter and maternal aunt; Diabetes in her daughter; Healthy in her daughter; Hypertension in her daughter; Lung cancer (age of onset: 33) in her brother; Rheum arthritis (age of onset: 83) in her sister; Stroke (age of onset: 66) in her father; Tremor in her mother.  SOCIAL HISTORY:  The patient  reports that she quit smoking about 28 years ago. Her smoking use included cigarettes. She has a 7.50 pack-year smoking history. She has been exposed to tobacco smoke. She has never used smokeless tobacco. She reports that she does not drink alcohol and does not use drugs.  REVIEW OF SYSTEMS: Review of Systems  Constitutional: Positive for malaise/fatigue.  Cardiovascular:  Negative for chest pain, claudication, dyspnea on exertion, irregular heartbeat, leg swelling, near-syncope, orthopnea, palpitations, paroxysmal nocturnal dyspnea and syncope.  Respiratory:  Negative for shortness of breath.   Hematologic/Lymphatic: Negative for bleeding problem.  Musculoskeletal:  Positive for back pain and joint pain. Negative for muscle cramps and myalgias.  Neurological:  Negative for dizziness and light-headedness.    PHYSICAL EXAM:    08/16/2022   10:19 AM 08/12/2022    8:27 AM 08/09/2022   10:27 AM   Vitals with BMI  Height 5' 2"$  5' 2"$  5' 2"$   Weight 125 lbs 125 lbs 125 lbs 6 oz  BMI 22.86 [id number]  Systolic [id number]  Diastolic 66 72 61  Pulse 77 80 82    Physical Exam  Constitutional: No distress.  Age appropriate, hemodynamically stable.   HENT:  Poor oral hygiene   Neck: No JVD present.  Cardiovascular: Normal rate, regular rhythm, S1 normal, S2 normal, intact distal pulses and normal pulses. Exam reveals no gallop, no S3 and no S4.  No murmur heard. Pulmonary/Chest: Effort normal and breath sounds normal. No stridor. She has no wheezes. She has no rales.  Abdominal: Soft. Bowel sounds are normal. She exhibits no distension. There is no abdominal tenderness.  Musculoskeletal:        General: No edema.     Cervical back: Neck supple.  Neurological: She is alert and oriented to person, place, and time. She has intact cranial nerves (2-12).  Skin: Skin is warm and moist.   CARDIAC DATABASE: EKG: 07/22/2022: Sinus rhythm, 97 bpm, normal axis, without underlying injury pattern.  Echocardiogram: 07/17/2022: LVEF 35-40%, global hypokinesis, grade 1 diastolic impairment, mild MR, estimated RAP 3 mmHg.   Stress Testing: No results found for this or any previous visit from the past 1095 days.   Heart Catheterization: 07/30/2022: LM: Nonexistent. Dual ostial present to LAD and Lcx LAD: Mild medial calcification. No significant disease Lcx: No significant disease RCA: No significant disease   LVEDP 12 mmHg RA: 2 mmHg RV: 19/1 mmHg PA: 20/8 mmHg, mPAP 13 mmHg PCW: 3 mmHg   CO: 4.9 L/min CI: 3.1 L/min/m2   No oxygen step up on shunt run   Well compensated nonischemic cardiomyopathy   ABI 12/04/2021 Right: Resting right ankle-brachial index is within normal range. No evidence of significant right lower extremity arterial disease. The right toe-brachial  index is normal.   Left: Resting left ankle-brachial index is within normal range. No evidence of  significant left lower extremity arterial disease. The left toe-brachial index is normal.   LABORATORY DATA:    Latest Ref Rng & Units 07/30/2022    8:32 AM 07/30/2022    8:29 AM 07/30/2022    8:25 AM  CBC  Hemoglobin 12.0 - 15.0 g/dL 10.5  10.9    10.5  10.9   Hematocrit 36.0 - 46.0 % 31.0  32.0    31.0  32.0        Latest Ref Rng & Units 07/30/2022    8:32 AM 07/30/2022    8:29 AM 07/30/2022    8:25 AM  CMP  Sodium 135 - 145 mmol/L 140  138    139  137   Potassium 3.5 - 5.1 mmol/L 3.7  3.9    3.8  3.9     Lipid Panel  Lab Results  Component Value Date   CHOL 110 02/28/2021   HDL 42 02/28/2021   LDLCALC 49 02/28/2021   LDLDIRECT 43 06/01/2020   TRIG 100 02/28/2021   CHOLHDL 2.6 02/28/2021     No components found for: "NTPROBNP" Recent Labs    07/22/22 1054  PROBNP 80   No results for input(s): "TSH" in the last 8760 hours.  BMP Recent Labs    02/07/22 0937 07/22/22 1054 07/30/22 0802 07/30/22 0825 07/30/22 0829 07/30/22 0832  NA 137 139   < > 137 139  138 140  K 4.1 4.2   < > 3.9 3.8  3.9 3.7  CL 104 102  --   --   --   --   CO2 26 20  --   --   --   --   GLUCOSE 122* 132*  --   --   --   --   BUN 10 15  --   --   --   --   CREATININE 0.42* 0.46*  --   --   --   --   CALCIUM 9.5 9.2  --   --   --   --   GFRNONAA >60  --   --   --   --   --    < > = values in this interval not displayed.    HEMOGLOBIN A1C Lab Results  Component Value Date   HGBA1C 6.1 (H) 05/28/2022   MPG 142.72 02/07/2022    IMPRESSION:    ICD-10-CM   1. Chronic HFrEF (heart failure with reduced ejection fraction) (HCC)  I50.22 sacubitril-valsartan (ENTRESTO) 24-26 MG    metoprolol succinate (TOPROL-XL) 25 MG 24 hr tablet    Basic metabolic panel    Magnesium    Pro b natriuretic peptide (BNP)    2. Nonischemic cardiomyopathy (HCC)  99991111 Basic metabolic panel    Magnesium    Pro b natriuretic peptide (BNP)    3. Coronary artery calcification seen on computed  tomography  I25.10     4. Aortic atherosclerosis (HCC)  I70.0     5. Non-insulin dependent type 2 diabetes mellitus ([place])  E11.9     6. Type 2 diabetes mellitus with hyperlipidemia (HCC)  E11.69    E78.5     7. Benign hypertension  I10     8. Former smoker  Z87.891        RECOMMENDATIONS: Michelle [name] is a 72 y.o. Caucasian female whose past medical history and cardiac risk factors  include: Nonischemic cardiomyopathy, chronic HFrEF coronary artery calcification, aortic atherosclerosis, former smoker, non-insulin-dependent diabetes mellitus type 2, hyperlipidemia, hypertension.   Chronic HFrEF (heart failure with reduced ejection fraction) (HCC) Nonischemic cardiomyopathy Stage B, NYHA class II Newly discovered. Denies anginal discomfort or heart failure symptoms. Left and right heart catheterization results reviewed with the patient and her daughter Nancee Liter 24/26 mg p.o. twice daily. Will hold off on up titration of Entresto due to soft blood pressures. I have asked her to keep a log of her blood pressures and daily weights at home and it is rule out SBP will uptitrate GDMT. Labs after initiating Delene Loll still pending -encouraged to have them done.  Refuses to be on Jardiance/Farxiga given her multiple history of yeast infections. Refilled Entresto and Toprol-XL   Coronary artery calcification seen on computed tomography Aortic atherosclerosis (HCC) Continue aspirin and statin therapy  Non-insulin dependent type 2 diabetes mellitus ([place]) Reemphasized importance of glycemic control. Currently on statin therapy, ARNI Unable to do SGLT2 inhibitors due to a prior history of yeast infections.  Type 2 diabetes mellitus with hyperlipidemia ([place]) Recommend a goal LDL of <55 mg/dL. Lipids currently being managed by PCP  Benign hypertension Office blood pressures are not well-controlled. Medication changes as discussed above. Advised to keep a log of her blood  pressure stable.  FINAL MEDICATION LIST END OF ENCOUNTER: Meds ordered this encounter  Medications   sacubitril-valsartan (ENTRESTO) 24-26 MG    Sig: Take 1 tablet by mouth 2 (two) times daily.    Dispense:  180 tablet    Refill:  0   metoprolol succinate (TOPROL-XL) 25 MG 24 hr tablet    Sig: Take 1 tablet (25 mg total) by mouth at bedtime.    Dispense:  90 tablet    Refill:  0    Medications Discontinued During This Encounter  Medication Reason   inFLIXimab (REMICADE) 100 MG injection Patient Preference   hydrOXYzine (ATARAX) 25 MG tablet Patient Preference   loperamide (IMODIUM) 2 MG capsule Patient Preference   empagliflozin (JARDIANCE) 10 MG TABS tablet Patient Preference   metoprolol succinate (TOPROL-XL) 25 MG 24 hr tablet Reorder   ENTRESTO 24-26 MG Reorder     Current Outpatient Medications:    Accu-Chek Softclix Lancets lancets, Please use to check blood sugar up to two times per day. E11.42, Disp: 200 each, Rfl: 4   acetaminophen (TYLENOL) 650 MG CR tablet, Take 650 mg by mouth every 8 (eight) hours as needed for pain., Disp: , Rfl:    aspirin EC 81 MG tablet, Take 1 tablet (81 mg total) by mouth daily. Swallow whole., Disp: 90 tablet, Rfl: 0   betamethasone dipropionate 0.05 % lotion, Apply topically as directed. Apply to the scalp daily up to 5 days for scalp (Patient taking differently: Apply 1 application  topically daily as needed (scalp irritation). Apply to the scalp daily up to 5 days for scalp), Disp: 60 mL, Rfl: 2   Blood Glucose Monitoring Suppl (ONE TOUCH ULTRA 2) w/Device KIT, USE AS DIRECTED, Disp: 1 kit, Rfl: 0   ciprofloxacin (CIPRO) 500 MG tablet, Take 1 tablet (500 mg total) by mouth 2 (two) times daily for 10 days., Disp: 20 tablet, Rfl: 0   clobetasol (TEMOVATE) 0.05 % external solution, Apply 1 application  topically daily. To scalp up to 5 times per week (Patient taking differently: Apply 1 application  topically daily as needed (scalp irritation.). To  scalp up to 5 times per week), Disp: 50 mL, Rfl:  3   cromolyn (OPTICROM) 4 % ophthalmic solution, Place 1 drop into both eyes 3 (three) times daily as needed (itchy eyes)., Disp: , Rfl:    diclofenac Sodium (VOLTAREN) 1 % GEL, Apply 2 g topically 4 (four) times daily., Disp: , Rfl:    Dulaglutide 0.75 MG/0.5ML SOPN, Inject 0.75 mg into the skin once a week. (Patient taking differently: Inject 0.75 mg into the skin every Wednesday.), Disp: 6 mL, Rfl: 0   fluticasone (FLONASE) 50 MCG/ACT nasal spray, Place 2 sprays into both nostrils daily as needed for allergies., Disp: 144 mL, Rfl: 1   fluticasone furoate-vilanterol (BREO ELLIPTA) 100-25 MCG/ACT AEPB, TAKE 1 PUFF BY MOUTH EVERY DAY, Disp: 180 each, Rfl: 1   gabapentin (NEURONTIN) 300 MG capsule, TAKE 1 CAPSULE BY MOUTH EVERYDAY AT BEDTIME, Disp: 90 capsule, Rfl: 3   glucose blood test strip, Use as instructed a few times a week as needed, Disp: 100 each, Rfl: 12   glucose blood test strip, Use to check blood glucose 2 times a day as needed, Disp: 100 each, Rfl: 12   leflunomide (ARAVA) 10 MG tablet, Take 10 mg by mouth daily., Disp: , Rfl:    metFORMIN (GLUCOPHAGE-XR) 500 MG 24 hr tablet, TAKE 2 TABLETS BY MOUTH TWICE A DAY, Disp: 360 tablet, Rfl: 1   metroNIDAZOLE (FLAGYL) 500 MG tablet, Take 1 tablet (500 mg total) by mouth 3 (three) times daily for 10 days., Disp: 30 tablet, Rfl: 0   mirabegron ER (MYRBETRIQ) 50 MG TB24 tablet, Take 50 mg by mouth in the morning., Disp: , Rfl:    mometasone (ELOCON) 0.1 % cream, APPLY 1 APPLICATION TOPICALLY DAILY AS NEEDED (RASH). (Patient taking differently: Apply 1 Application topically daily as needed (rash).), Disp: 45 g, Rfl: 3   mometasone-formoterol (DULERA) 100-5 MCG/ACT AERO, Inhale 2 puffs into the lungs 2 (two) times daily., Disp: , Rfl:    montelukast (SINGULAIR) 10 MG tablet, TAKE 1 TABLET BY MOUTH EVERY DAY (Patient taking differently: Take 10 mg by mouth at bedtime.), Disp: 30 tablet, Rfl: 0    mupirocin ointment (BACTROBAN) 2 %, APPLY TOPICALLY ONCE DAILY TO OPEN SORES ON ARMS, LEGS AND TOES UNTIL HEALED. THEN APPLY TOPICALLY TO AFFECTED AREAS AS NEEDED FOR FLARES, Disp: 22 g, Rfl: 0   nitroGLYCERIN (NITROSTAT) 0.4 MG SL tablet, Place 1 tablet (0.4 mg total) under the tongue every 5 (five) minutes as needed., Disp: 25 tablet, Rfl: 1   omeprazole (PRILOSEC) 40 MG capsule, Take 1 capsule (40 mg total) by mouth 2 (two) times daily. (Patient taking differently: Take 40 mg by mouth in the morning.), Disp: 180 capsule, Rfl: 0   oxyCODONE-acetaminophen (PERCOCET) 10-325 MG tablet, Take 1 tablet by mouth every 6 (six) hours as needed for pain., Disp: 120 tablet, Rfl: 0   rosuvastatin (CRESTOR) 10 MG tablet, Take 1 tablet (10 mg total) by mouth daily. (Patient taking differently: Take 10 mg by mouth every evening.), Disp: 90 tablet, Rfl: 3   sertraline (ZOLOFT) 50 MG tablet, Take 1 tablet (50 mg total) by mouth daily., Disp: 90 tablet, Rfl: 3   VENTOLIN HFA 108 (90 Base) MCG/ACT inhaler, INHALE 2 PUFFS BY MOUTH EVERY 4 HOURS AS NEEDED FOR WHEEZING OR SHORTNESS OF BREATH, Disp: 108 each, Rfl: 11   metoprolol succinate (TOPROL-XL) 25 MG 24 hr tablet, Take 1 tablet (25 mg total) by mouth at bedtime., Disp: 90 tablet, Rfl: 0   sacubitril-valsartan (ENTRESTO) 24-26 MG, Take 1 tablet by mouth 2 (two) times  daily., Disp: 180 tablet, Rfl: 0  Orders Placed This Encounter  Procedures   Basic metabolic panel   Magnesium   Pro b natriuretic peptide (BNP)    There are no Patient Instructions on file for this visit.   --Continue cardiac medications as reconciled in final medication list. --Return in about 6 weeks (around 09/27/2022) for Follow up, heart failure management.. or sooner if needed. --Continue follow-up with your primary care physician regarding the management of your other chronic comorbid conditions.  Patient's questions and concerns were addressed to her satisfaction. She voices  understanding of the instructions provided during this encounter.   This note was created using a voice recognition software as a result there may be grammatical errors inadvertently enclosed that do not reflect the nature of this encounter. Every attempt is made to correct such errors.  Rex Kras, [place], [facility]  Pager: [phone] Office: [phone]

## 2022-08-17 LAB — BASIC METABOLIC PANEL
BUN/Creatinine Ratio: 26 (ref 12–28)
BUN: 12 mg/dL (ref 8–27)
CO2: 20 mmol/L (ref 20–29)
Calcium: 8.8 mg/dL (ref 8.7–10.3)
Chloride: 102 mmol/L (ref 96–106)
Creatinine, Ser: 0.46 mg/dL — ABNORMAL LOW (ref 0.57–1.00)
Glucose: 172 mg/dL — ABNORMAL HIGH (ref 70–99)
Potassium: 4.5 mmol/L (ref 3.5–5.2)
Sodium: 138 mmol/L (ref 134–144)
eGFR: 102 mL/min/{1.73_m2} (ref >59)

## 2022-08-17 LAB — PRO B NATRIURETIC PEPTIDE: NT-Pro BNP: 44 pg/mL (ref 0–301)

## 2022-08-17 LAB — MAGNESIUM: Magnesium: 2 mg/dL (ref 1.6–2.3)

## 2022-08-19 NOTE — Progress Notes (Signed)
Gave patient results. She acknowledged understanding and had no further questions.

## 2022-08-20 ENCOUNTER — Ambulatory Visit (HOSPITAL_BASED_OUTPATIENT_CLINIC_OR_DEPARTMENT_OTHER): Admission: RE | Admit: 2022-08-20 | Payer: 59 | Source: Ambulatory Visit

## 2022-08-21 ENCOUNTER — Telehealth: Payer: Self-pay

## 2022-08-21 ENCOUNTER — Ambulatory Visit (HOSPITAL_BASED_OUTPATIENT_CLINIC_OR_DEPARTMENT_OTHER)
Admission: RE | Admit: 2022-08-21 | Discharge: 2022-08-21 | Disposition: A | Discharge status: Home / Self Care | Payer: 59 | Source: Ambulatory Visit | Attending: Family Medicine | Admitting: Family Medicine

## 2022-08-21 DIAGNOSIS — R1032 Left lower quadrant pain: Secondary | ICD-10-CM | POA: Diagnosis not present

## 2022-08-21 DIAGNOSIS — N134 Hydroureter: Secondary | ICD-10-CM | POA: Diagnosis not present

## 2022-08-21 DIAGNOSIS — I700 Atherosclerosis of aorta: Secondary | ICD-10-CM | POA: Diagnosis not present

## 2022-08-21 MED ORDER — IOHEXOL 300 MG/ML  SOLN
100.0000 mL | Freq: Once | INTRAMUSCULAR | Status: AC | PRN
  Administered 2022-08-21: 65 mL via INTRAVENOUS

## 2022-08-21 NOTE — Telephone Encounter (Signed)
Patient LVM on nurse line regarding increasing Trulicity.   Attempted to call patient back as Trulicity is not on medication list.   She did not answer, LVM asking patient to return call to office to discuss request further.   Talbot Grumbling, RN

## 2022-08-22 ENCOUNTER — Ambulatory Visit (HOSPITAL_COMMUNITY): Payer: 59

## 2022-08-22 DIAGNOSIS — M5416 Radiculopathy, lumbar region: Secondary | ICD-10-CM | POA: Diagnosis not present

## 2022-08-22 NOTE — Telephone Encounter (Signed)
Patient returns call to nurse line.   She reports recent visit with Cardiologist who drew labs on 2/16. She reports she was told to contact her PCP for glucose reading of 172.  She reports home CBGs over the last week have been "around there and low 200s." She reports compliance with all her medications. She denies any hyperglycemic episodes.   She would like to discuss increasing Trulicity dosage.   Patient advised she has an apt with PCP on 2/26 and medication management can be discussed then.   Patient advised to monitor her glucose levels and ED precautions given for hyperglycemic episodes.

## 2022-08-23 ENCOUNTER — Other Ambulatory Visit: Payer: Self-pay | Admitting: Dermatology

## 2022-08-23 DIAGNOSIS — T148XXA Other injury of unspecified body region, initial encounter: Secondary | ICD-10-CM

## 2022-08-23 DIAGNOSIS — L409 Psoriasis, unspecified: Secondary | ICD-10-CM

## 2022-08-26 ENCOUNTER — Encounter: Payer: Self-pay | Admitting: Family Medicine

## 2022-08-26 ENCOUNTER — Ambulatory Visit (INDEPENDENT_AMBULATORY_CARE_PROVIDER_SITE_OTHER): Payer: 59 | Admitting: Family Medicine

## 2022-08-26 VITALS — BP 101/84 | HR 80 | Ht 62.0 in | Wt 126.5 lb

## 2022-08-26 DIAGNOSIS — Z0184 Encounter for antibody response examination: Secondary | ICD-10-CM | POA: Diagnosis not present

## 2022-08-26 DIAGNOSIS — G8929 Other chronic pain: Secondary | ICD-10-CM | POA: Diagnosis not present

## 2022-08-26 DIAGNOSIS — E1142 Type 2 diabetes mellitus with diabetic polyneuropathy: Secondary | ICD-10-CM | POA: Diagnosis not present

## 2022-08-26 LAB — POCT GLYCOSYLATED HEMOGLOBIN (HGB A1C): HbA1c, POC (controlled diabetic range): 8 % — AB (ref 0.0–7.0)

## 2022-08-26 MED ORDER — DULAGLUTIDE 0.75 MG/0.5ML ~~LOC~~ SOAJ: 1.5000 mg | SUBCUTANEOUS | 0 refills | Status: DC

## 2022-08-26 MED ORDER — OXYCODONE-ACETAMINOPHEN 10-325 MG PO TABS: 1.0000 | ORAL_TABLET | Freq: Four times a day (QID) | ORAL | 0 refills | Status: DC | PRN

## 2022-08-26 NOTE — Assessment & Plan Note (Signed)
Stable on current regimen.  Has not been able to tolerate dose decreases.  Additional 3 months of oxycodone-acetaminophen given.

## 2022-08-26 NOTE — Patient Instructions (Addendum)
It was nice seeing you today!  Increase Trulicity to 1.5 mg once a week.  Think about getting the new pneumonia vaccine (Prevnar 20).  Get the Tdap vaccine at your pharmacy.  We will check your immunity titers against hepatitis A and hepatitis B.  Stay well, [name], MD [facility] [phone]  --  Make sure to check out at the front desk before you leave today.  Please arrive at least 15 minutes prior to your scheduled appointments.  If you had blood work today, I will send you a MyChart message or a letter if results are normal. Otherwise, I will give you a call.  If you had a referral placed, they will call you to set up an appointment. Please give [place] a call if you don't hear back in the next 2 weeks.  If you need additional refills before your next appointment, please call your pharmacy first.

## 2022-08-26 NOTE — Progress Notes (Signed)
SUBJECTIVE:   CHIEF COMPLAINT / HPI:  No chief complaint on file.   Reports elevated CBGs at home.  Postprandial sugar yesterday was in the 400s. She saw her neurosurgeon last week, no further surgery recommended but she did get a corticosteroid injection. She wants to increase her Trulicity dose.  She is doing well without any side effects.  Weight is stable, not concerned about decreased appetite anymore. She is drinking 3 V8 mango drinks per day, asking if she should cut back.  Abdominal pain is significantly improved.  She did finish the antibiotics which she thinks helped.  She is also taking MiraLAX as needed.  Feels constipated sometimes.  She is still feeling fatigued.  Not having any issues of leg swelling.  Needs refill of Percocet for her chronic pain due to rheumatoid arthritis.  She reports that she has been taking the medication 4 times a day.  She has tried to cut back in the past but unable to due to pain.  Reports that she is unable to walk if she does not take the pain medication.  She has her vaccination record and was told to ask about hepatitis A, hepatitis B, Tdap, and PCV 20.   PERTINENT  PMH / PSH: HFrEF, asthma, T2DM, RA, chronic pain, HLD, GAD  Patient Care Team: [name], MD as PCP - General (Family Medicine) Alda Berthold, DO as Consulting Physician (Neurology)   OBJECTIVE:   BP 101/84   Pulse 80   Ht '5\' 2"'$  (1.575 m)   Wt 126 lb 8 oz (57.4 kg)   SpO2 100%   BMI 23.14 kg/m   Physical Exam Constitutional:      General: She is not in acute distress. HENT:     Head: Normocephalic and atraumatic.  Cardiovascular:     Rate and Rhythm: Normal rate and regular rhythm.  Pulmonary:     Effort: Pulmonary effort is normal. No respiratory distress.     Comments: Diffuse faint rales Abdominal:     General: Bowel sounds are normal.     Palpations: Abdomen is soft.     Tenderness: There is no abdominal tenderness.  Musculoskeletal:     Cervical  back: Neck supple.     Right lower leg: No edema.     Left lower leg: No edema.  Neurological:     Mental Status: She is alert.         08/26/2022    8:25 AM  Depression screen PHQ 2/9  Decreased Interest 1  Down, Depressed, Hopeless 1  PHQ - 2 Score 2  Altered sleeping 3  Tired, decreased energy 1  Change in appetite 0  Feeling bad or failure about yourself  0  Trouble concentrating 0  Moving slowly or fidgety/restless 0  Suicidal thoughts 0  PHQ-9 Score 6     Wt Readings from Last 3 Encounters:  08/26/22 126 lb 8 oz (57.4 kg)  08/16/22 125 lb (56.7 kg)  08/12/22 125 lb (56.7 kg)     Lab Results  Component Value Date   HGBA1C 8.0 (A) 08/26/2022        ASSESSMENT/PLAN:   Type II diabetes mellitus ([place]) Control worsening likely secondary to decrease of her dulaglutide. - increase dulaglutide to 1.5 mg weekly - continue metformin 1000 mg BID - advised to cut back on sugary drinks  Encounter for chronic pain management Stable on current regimen.  Has not been able to tolerate dose decreases.  Additional 3 months  of oxycodone-acetaminophen given.   HCM - needs hepatitis A and hepatitis B vaccines per vaccination record, will check titers today - advised to obtain Tdap at pharmacy - could get PCV20 if desired, she will think about it  Return in about 3 months (around 11/24/2022) for f/u diabetes.   [name], MD [place]

## 2022-08-26 NOTE — Assessment & Plan Note (Signed)
Control worsening likely secondary to decrease of her dulaglutide. - increase dulaglutide to 1.5 mg weekly - continue metformin 1000 mg BID - advised to cut back on sugary drinks

## 2022-08-27 LAB — LIPID PANEL
Chol/HDL Ratio: 2.9 ratio (ref 0.0–4.4)
Cholesterol, Total: 117 mg/dL (ref 100–199)
HDL: 41 mg/dL (ref >39)
LDL Chol Calc (NIH): 54 mg/dL (ref 0–99)
Triglycerides: 122 mg/dL (ref 0–149)
VLDL Cholesterol Cal: 22 mg/dL (ref 5–40)

## 2022-08-27 LAB — HEPATITIS A ANTIBODY, TOTAL: hep A Total Ab: NEGATIVE

## 2022-08-27 LAB — HEPATITIS B SURFACE ANTIBODY, QUANTITATIVE: Hepatitis B Surf Ab Quant: <3.1 m[IU]/mL — ABNORMAL LOW (ref >9.9)

## 2022-08-28 ENCOUNTER — Telehealth: Payer: Self-pay | Admitting: Family Medicine

## 2022-08-28 NOTE — Telephone Encounter (Signed)
Called patient to discuss lab results.  Low titers to both hepatitis A and hepatitis B.  Per [facility] she has not received either of these vaccinations.  She will need to complete the primary vaccination series for hepatitis A and hepatitis B.  Advised to schedule in the nurse clinic, she will call to make an appointment.  Will plan to check hepatitis B titers at least 1 month after she completes the primary series since she is immunocompromised.

## 2022-08-29 ENCOUNTER — Telehealth: Payer: Self-pay

## 2022-08-29 ENCOUNTER — Other Ambulatory Visit: Payer: Self-pay

## 2022-08-29 ENCOUNTER — Inpatient Hospital Stay: Payer: 59

## 2022-08-29 ENCOUNTER — Other Ambulatory Visit: Payer: Self-pay | Admitting: Family Medicine

## 2022-08-29 ENCOUNTER — Inpatient Hospital Stay: Payer: 59 | Attending: Hematology and Oncology | Admitting: Hematology and Oncology

## 2022-08-29 VITALS — BP 114/64 | HR 86 | Temp 97.0°F | Resp 17 | Wt 126.0 lb

## 2022-08-29 DIAGNOSIS — D509 Iron deficiency anemia, unspecified: Secondary | ICD-10-CM | POA: Insufficient documentation

## 2022-08-29 DIAGNOSIS — Z801 Family history of malignant neoplasm of trachea, bronchus and lung: Secondary | ICD-10-CM | POA: Diagnosis not present

## 2022-08-29 DIAGNOSIS — Z800 Family history of malignant neoplasm of digestive organs: Secondary | ICD-10-CM | POA: Diagnosis not present

## 2022-08-29 DIAGNOSIS — D500 Iron deficiency anemia secondary to blood loss (chronic): Secondary | ICD-10-CM | POA: Diagnosis not present

## 2022-08-29 DIAGNOSIS — D649 Anemia, unspecified: Secondary | ICD-10-CM | POA: Diagnosis present

## 2022-08-29 LAB — IRON AND IRON BINDING CAPACITY (CC-WL,HP ONLY)
Iron: 55 ug/dL (ref 28–170)
Saturation Ratios: 11 % (ref 10.4–31.8)
TIBC: 503 ug/dL — ABNORMAL HIGH (ref 250–450)
UIBC: 448 ug/dL — ABNORMAL HIGH (ref 148–442)

## 2022-08-29 LAB — CBC WITH DIFFERENTIAL (CANCER CENTER ONLY)
Abs Immature Granulocytes: 0.03 10*3/uL (ref 0.00–0.07)
Basophils Absolute: 0.1 10*3/uL (ref 0.0–0.1)
Basophils Relative: 1 %
Eosinophils Absolute: 1 10*3/uL — ABNORMAL HIGH (ref 0.0–0.5)
Eosinophils Relative: 10 %
HCT: 34.9 % — ABNORMAL LOW (ref 36.0–46.0)
Hemoglobin: 11.2 g/dL — ABNORMAL LOW (ref 12.0–15.0)
Immature Granulocytes: 0 %
Lymphocytes Relative: 34 %
Lymphs Abs: 3.2 10*3/uL (ref 0.7–4.0)
MCH: 27.5 pg (ref 26.0–34.0)
MCHC: 32.1 g/dL (ref 30.0–36.0)
MCV: 85.5 fL (ref 80.0–100.0)
Monocytes Absolute: 0.8 10*3/uL (ref 0.1–1.0)
Monocytes Relative: 8 %
Neutro Abs: 4.4 10*3/uL (ref 1.7–7.7)
Neutrophils Relative %: 47 %
Platelet Count: 281 10*3/uL (ref 150–400)
RBC: 4.08 MIL/uL (ref 3.87–5.11)
RDW: 16.4 % — ABNORMAL HIGH (ref 11.5–15.5)
WBC Count: 9.4 10*3/uL (ref 4.0–10.5)
nRBC: 0 % (ref 0.0–0.2)

## 2022-08-29 LAB — CMP (CANCER CENTER ONLY)
ALT: 14 U/L (ref 0–44)
AST: 19 U/L (ref 15–41)
Albumin: 4.2 g/dL (ref 3.5–5.0)
Alkaline Phosphatase: 88 U/L (ref 38–126)
Anion gap: 7 (ref 5–15)
BUN: 13 mg/dL (ref 8–23)
CO2: 28 mmol/L (ref 22–32)
Calcium: 9.3 mg/dL (ref 8.9–10.3)
Chloride: 102 mmol/L (ref 98–111)
Creatinine: 0.42 mg/dL — ABNORMAL LOW (ref 0.44–1.00)
GFR, Estimated: >60 mL/min (ref >60)
Glucose, Bld: 109 mg/dL — ABNORMAL HIGH (ref 70–99)
Potassium: 4.4 mmol/L (ref 3.5–5.1)
Sodium: 137 mmol/L (ref 135–145)
Total Bilirubin: 0.3 mg/dL (ref 0.3–1.2)
Total Protein: 6.7 g/dL (ref 6.5–8.1)

## 2022-08-29 LAB — VITAMIN B12: Vitamin B-12: 319 pg/mL (ref 180–914)

## 2022-08-29 LAB — RETIC PANEL
Immature Retic Fract: 15.8 % (ref 2.3–15.9)
RBC.: 4.03 MIL/uL (ref 3.87–5.11)
Retic Count, Absolute: 46.3 10*3/uL (ref 19.0–186.0)
Retic Ct Pct: 1.2 % (ref 0.4–3.1)
Reticulocyte Hemoglobin: 28.9 pg (ref >27.9)

## 2022-08-29 LAB — FERRITIN: Ferritin: 11 ng/mL (ref 11–307)

## 2022-08-29 LAB — FOLATE: Folate: 33.6 ng/mL (ref >5.9)

## 2022-08-29 NOTE — Telephone Encounter (Signed)
The 3 mg dose will be too big of a jump.  She should continue on the 0.75 mg until she is able to get the 1.5 mg dose.

## 2022-08-29 NOTE — Progress Notes (Signed)
[place] [phone]   [phone]  INITIAL CONSULT NOTE  Patient Care Team: [name], MD as PCP - General (Family Medicine) Alda Berthold, DO as Consulting Physician (Neurology)  Hematological/Oncological History # Easy Bruising # Anemia  07/30/2022: Hgb 10.5 08/29/2022: establish care with Dr. Lorenso Courier   CHIEF COMPLAINTS/PURPOSE OF CONSULTATION:  "Anemia  "  HISTORY OF PRESENTING ILLNESS:  Michelle [name] 72 y.o. female with medical history significant for rheumatoid arthritis, carpal tunnel syndrome, diverticulosis, hypertension, gastroparesis, GERD, restless leg syndrome, and hyperlipidemia who presents for evaluation of anemia of unclear etiology.  On review of the previous records Michelle [name] has a longstanding history of anemia, dating back to at least 2019 on our records.  Most recently the patient was found to have hemoglobins in the 10s since 2024.  Due to concern for these findings the patient was referred to hematology for further evaluation and management.  On exam today Michelle [name] is accompanied by her daughter.  She reports that she has a history of diverticulitis and just recently finished up a course of antibiotics for a flare.  She notes that she feels like she has "low iron" as she is cold all the time and has fatigue.  She does have some occasional nosebleeds but no bright red blood in the stool.  She does have "bad headaches".  She does that she is not currently on any iron pills.  She does have some occasional dark stools and some occasional abdominal pain.  She notes that she has been having a decline in her weight, with the current weight being 126 pounds, but she reports she was down to 118 at 1 point in time from maximum of 137.  Patient reports that she eats a regular diet and eats "everything and anything".  She does that she enjoys red meat as well as chili beans and steak.  On further discussion she reports that her  mother aspirated the age 60 and her father had a stroke and died age 90.  She notes that she is a former smoker having quit 29 years ago.  She does not drink any alcohol now.  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  She otherwise denies any fevers, chills, sweats, nausea, vomiting or diarrhea.  A full 10 point ROS was otherwise negative.  MEDICAL HISTORY:  Past Medical History:  Diagnosis Date   Actinic keratosis    Allergic rhinitis 08/28/2006   Anemia    low iron   Anginal pain (HCC)    Asthma, chronic 03/14/2010   Does seem to use her albuterol excessively. Consider change to more manageable medicines and treat more like COPD in the future.   Carpal tunnel syndrome, left 05/29/2009   Last Assessment & Plan:  Advised to wear wrist brace at night when she is sleeping.  If this does not help will consider having her go to sports medicine.   Chronic frontal sinusitis 08/28/2016   Chronic left maxillary sinusitis 08/28/2016   Diverticulosis    Essential hypertension 10/30/2012   Gastroparesis 11/19/2011   DM and chronic narcotics contribute.  Causes dysphagia like symptoms    Generalized anxiety disorder 08/28/2006   GERD (gastroesophageal reflux disease)    Hearing loss 07/19/2015   Hearing aid in right ear   History of cataracts, bilateral    Hyperlipidemia 08/28/2006   Lumbar radiculopathy 04/07/2018   Lung nodules    Osteoporosis 08/08/2011   Pain in  joint, pelvic region and thigh 11/18/2012   Radial styloid tenosynovitis 08/15/2013   Restless leg syndrome 05/14/2012   Seropositive rheumatoid arthritis 01/08/2010   Patient is being followed by rheumatologist at [facility] patient is on Plaquenil and as well as prednisone.   Changed to methotrexate and prednisone in May of 2013  Patient does have a pain contract here with [facility] receives oxycodone 10/325 mg tabs every 6 hours. This has been titrated down from  significant doses of methadone previously. Could attempt to tit   Shoulder joint pain 03/25/2011   The patient is compensating due to her not being able to use her left arm right now rotator cuff impingement if he continues to worsen would not be surprised if she does get a tear on this side as well   Squamous cell carcinoma in situ of skin 11/25/2014   Referred to dermatology for excision    Squamous cell carcinoma of skin 12/26/2014   Right distal pretibial. SCC-KA pattern.   Squamous cell carcinoma of skin 03/27/2015   Right inf. lat. knee. SCC-KA pattern.    Squamous cell carcinoma of skin 10/07/2016   Left lateral calf superior. WD SCC.   Squamous cell carcinoma of skin 10/07/2016   Left lateral calf inferior. WD SCC.   Squamous cell carcinoma of skin 04/28/2018   Left mid med. pretibial. WD SCC.   Squamous cell carcinoma of skin 08/12/2019   Left mid med. pretibial sup. KA type. EDC   Squamous cell carcinoma of skin 08/12/2019   Left mid med. pretibial inferior. WD. EDC.   Tear of medial meniscus of knee, left 04/27/2009   Type II diabetes mellitus 08/28/2006   Overview:  Patient has her ups and downs, go in line with flares of her RA and prednisone uses.  Patient does have hx of hypoglycemic events so would not try for perfect control but should have A1c goal around 7.0 Lab Results  Component Value Date   HGBA1C 6.9 12/27/2011   Last Assessment & Plan:  At goal. Will continue current metformin    SURGICAL HISTORY: Past Surgical History:  Procedure Laterality Date   ABDOMINAL HYSTERECTOMY     APPENDECTOMY     BACK SURGERY     BIOPSY  05/10/2021   Procedure: BIOPSY;  Surgeon: Milus Banister, MD;  Location: [facility];  Service: Endoscopy;;  EGD and COLON   CARPAL TUNNEL RELEASE Right    CATARACT EXTRACTION Left    with lid lift   CHOLECYSTECTOMY     COLONOSCOPY     COLONOSCOPY WITH PROPOFOL N/A 05/10/2021   Procedure: COLONOSCOPY WITH PROPOFOL;  Surgeon: Milus Banister, MD;  Location: [facility];  Service: Endoscopy;  Laterality: N/A;   ESOPHAGOGASTRODUODENOSCOPY (EGD) WITH PROPOFOL N/A 05/10/2021   Procedure: ESOPHAGOGASTRODUODENOSCOPY (EGD) WITH PROPOFOL;  Surgeon: Milus Banister, MD;  Location: [facility];  Service: Endoscopy;  Laterality: N/A;   HERNIA REPAIR     KNEE SURGERY Right    x2- cartilage annd scar tissue   LEFT AND RIGHT HEART CATHETERIZATION WITH CORONARY ANGIOGRAM N/A 08/17/2013   Procedure: LEFT AND RIGHT HEART CATHETERIZATION WITH CORONARY ANGIOGRAM;  Surgeon: Laverda Page, MD;  Location: [facility] CATH LAB;  Service: Cardiovascular;  Laterality: N/A;   lumbar back surgery  04/04/2018   done by neurosurgeron   right foot bone spurs removed     RIGHT/LEFT HEART CATH AND CORONARY ANGIOGRAPHY N/A 07/30/2022   Procedure: RIGHT/LEFT HEART CATH AND  CORONARY ANGIOGRAPHY;  Surgeon: Nigel Mormon, MD;  Location: [facility];  Service: Cardiovascular;  Laterality: N/A;   ROTATOR CUFF REPAIR Right    SHOULDER SURGERY Left    x2- rotatar cuff tear   UPPER GASTROINTESTINAL ENDOSCOPY      SOCIAL HISTORY: Social History   Socioeconomic History   Marital status: Married    Spouse name: Programme researcher, broadcasting/film/video   Number of children: 3   Years of education: 9   Highest education level: 9th grade  Occupational History   Occupation: Retired/disabled    Fish farm manager: UNEMPLOYED  Tobacco Use   Smoking status: Former    Packs/day: 1.50    Years: 5.00    Total pack years: 7.50    Types: Cigarettes    Quit date: 05/31/1994    Years since quitting: 28.2    Passive exposure: Past   Smokeless tobacco: Never  Vaping Use   Vaping Use: Never used  Substance and Sexual Activity   Alcohol use: No    Alcohol/week: 0.0 standard drinks of alcohol   Drug use: No   Sexual activity: Not on file  Other Topics Concern   Not on file  Social History Narrative   Pt is on [employer] program [place]   Lives with husband and granddgt.   Lots of family  stressors including special-needs grandaughter who lives with them.      Advance Directives: None - Patient's plan is that her husband will make decisions for her if she were not capable of making informed decisions for herself. (Discussion with Sherren Mocha McDiarmid, MD 12/11/18)   Full Code: Desires CPR/ACLS  (Discussion with Sherren Mocha McDiarmid, MD 12/11/18)         Social Determinants of Health   Financial Resource Strain: Not on file  Food Insecurity: No Food Insecurity (12/10/2018)   Hunger Vital Sign    Worried About Running Out of Food in the Last Year: Never true    Ran Out of Food in the Last Year: Never true  Transportation Needs: No Transportation Needs (12/10/2018)   PRAPARE - Hydrologist (Medical): No    Lack of Transportation (Non-Medical): No  Physical Activity: Not on file  Stress: No Stress Concern Present (12/10/2018)   [place]    Feeling of Stress : Not at all  Social Connections: Not on file  Intimate Partner Violence: Not on file    FAMILY HISTORY: Family History  Problem Relation Age of Onset   Stroke Father 20   Colon cancer Maternal Aunt    Tremor Mother    Lung cancer Brother 67   Rheum arthritis Sister 34   Colon cancer Daughter    COPD Sister    Cancer Sister        in nose   Diabetes Daughter    Hypertension Daughter    Healthy Daughter    Bell's palsy Maternal Grandmother    Esophageal cancer Neg Hx    Rectal cancer Neg Hx    Stomach cancer Neg Hx     ALLERGIES:  is allergic to zofran [ondansetron hcl], ace inhibitors, levofloxacin, lisinopril, sulfonamide derivatives, and sulfa antibiotics.  MEDICATIONS:  Current Outpatient Medications  Medication Sig Dispense Refill   Accu-Chek Softclix Lancets lancets Please use to check blood sugar up to two times per day. E11.42 200 each 4   acetaminophen (TYLENOL) 650 MG CR tablet Take 650 mg by mouth every 8  (  eight) hours as needed for pain.     aspirin EC 81 MG tablet Take 1 tablet (81 mg total) by mouth daily. Swallow whole. 90 tablet 0   betamethasone dipropionate 0.05 % lotion Apply topically as directed. Apply to the scalp daily up to 5 days for scalp (Patient taking differently: Apply 1 application  topically daily as needed (scalp irritation). Apply to the scalp daily up to 5 days for scalp) 60 mL 2   Blood Glucose Monitoring Suppl (ONE TOUCH ULTRA 2) w/Device KIT USE AS DIRECTED 1 kit 0   clobetasol (TEMOVATE) 0.05 % external solution Apply 1 application  topically daily. To scalp up to 5 times per week (Patient taking differently: Apply 1 application  topically daily as needed (scalp irritation.). To scalp up to 5 times per week) 50 mL 3   cromolyn (OPTICROM) 4 % ophthalmic solution Place 1 drop into both eyes 3 (three) times daily as needed (itchy eyes).     diclofenac Sodium (VOLTAREN) 1 % GEL Apply 2 g topically 4 (four) times daily.     Dulaglutide 0.75 MG/0.5ML SOPN Inject 0.75 mg into the skin once a week. 6 mL 0   fluticasone (FLONASE) 50 MCG/ACT nasal spray Place 2 sprays into both nostrils daily as needed for allergies. 144 mL 1   fluticasone furoate-vilanterol (BREO ELLIPTA) 100-25 MCG/ACT AEPB TAKE 1 PUFF BY MOUTH EVERY DAY 180 each 1   gabapentin (NEURONTIN) 300 MG capsule TAKE 1 CAPSULE BY MOUTH EVERYDAY AT BEDTIME 90 capsule 3   glucose blood test strip Use as instructed a few times a week as needed 100 each 12   glucose blood test strip Use to check blood glucose 2 times a day as needed 100 each 12   leflunomide (ARAVA) 10 MG tablet Take 10 mg by mouth daily.     metFORMIN (GLUCOPHAGE-XR) 500 MG 24 hr tablet TAKE 2 TABLETS BY MOUTH TWICE A DAY 360 tablet 1   metoprolol succinate (TOPROL-XL) 25 MG 24 hr tablet Take 1 tablet (25 mg total) by mouth at bedtime. 90 tablet 0   mirabegron ER (MYRBETRIQ) 50 MG TB24 tablet Take 50 mg by mouth in the morning.     mometasone (ELOCON) 0.1 %  cream APPLY 1 APPLICATION TOPICALLY DAILY AS NEEDED (RASH). 45 g 11   mometasone-formoterol (DULERA) 100-5 MCG/ACT AERO Inhale 2 puffs into the lungs 2 (two) times daily.     montelukast (SINGULAIR) 10 MG tablet TAKE 1 TABLET BY MOUTH EVERY DAY (Patient taking differently: Take 10 mg by mouth at bedtime.) 30 tablet 0   mupirocin ointment (BACTROBAN) 2 % APPLY TOPICALLY ONCE DAILY TO OPEN SORES ON ARMS, LEGS AND TOES UNTIL HEALED. THEN APPLY TOPICALLY TO AFFECTED AREAS AS NEEDED FOR FLARES 22 g 0   nitroGLYCERIN (NITROSTAT) 0.4 MG SL tablet Place 1 tablet (0.4 mg total) under the tongue every 5 (five) minutes as needed. 25 tablet 1   omeprazole (PRILOSEC) 40 MG capsule Take 1 capsule (40 mg total) by mouth 2 (two) times daily. (Patient taking differently: Take 40 mg by mouth in the morning.) 180 capsule 0   oxyCODONE-acetaminophen (PERCOCET) 10-325 MG tablet Take 1 tablet by mouth every 6 (six) hours as needed for pain. 120 tablet 0   [START ON 09/13/2022] oxyCODONE-acetaminophen (PERCOCET) 10-325 MG tablet Take 1 tablet by mouth every 6 (six) hours as needed for pain. 120 tablet 0   [START ON 10/14/2022] oxyCODONE-acetaminophen (PERCOCET) 10-325 MG tablet Take 1 tablet by mouth every  6 (six) hours as needed for pain. 120 tablet 0   [START ON 11/14/2022] oxyCODONE-acetaminophen (PERCOCET) 10-325 MG tablet Take 1 tablet by mouth every 6 (six) hours as needed for pain. 120 tablet 0   rosuvastatin (CRESTOR) 10 MG tablet Take 1 tablet (10 mg total) by mouth daily. (Patient taking differently: Take 10 mg by mouth every evening.) 90 tablet 3   sacubitril-valsartan (ENTRESTO) 24-26 MG Take 1 tablet by mouth 2 (two) times daily. 180 tablet 0   sertraline (ZOLOFT) 50 MG tablet Take 1 tablet (50 mg total) by mouth daily. 90 tablet 3   VENTOLIN HFA 108 (90 Base) MCG/ACT inhaler INHALE 2 PUFFS BY MOUTH EVERY 4 HOURS AS NEEDED FOR WHEEZING OR SHORTNESS OF BREATH 108 each 11   No current facility-administered  medications for this visit.    REVIEW OF SYSTEMS:   Constitutional: ( - ) fevers, ( - )  chills , ( - ) night sweats Eyes: ( - ) blurriness of vision, ( - ) double vision, ( - ) watery eyes Ears, nose, mouth, throat, and face: ( - ) mucositis, ( - ) sore throat Respiratory: ( - ) cough, ( - ) dyspnea, ( - ) wheezes Cardiovascular: ( - ) palpitation, ( - ) chest discomfort, ( - ) lower extremity swelling Gastrointestinal:  ( - ) nausea, ( - ) heartburn, ( - ) change in bowel habits Skin: ( - ) abnormal skin rashes Lymphatics: ( - ) new lymphadenopathy, ( - ) easy bruising Neurological: ( - ) numbness, ( - ) tingling, ( - ) new weaknesses Behavioral/Psych: ( - ) mood change, ( - ) new changes  All other systems were reviewed with the patient and are negative.  PHYSICAL EXAMINATION:  Vitals:   08/29/22 0900  BP: 114/64  Pulse: 86  Resp: 17  Temp: (!) 97 F (36.1 C)  SpO2: 99%   Filed Weights   08/29/22 0900  Weight: 126 lb (57.2 kg)    GENERAL: well appearing elderly Caucasian female, in NAD  SKIN: skin color, texture, turgor are normal, no rashes or significant lesions EYES: conjunctiva are pink and non-injected, sclera clear LUNGS: clear to auscultation and percussion with normal breathing effort HEART: regular rate & rhythm and no murmurs and no lower extremity edema Musculoskeletal: no cyanosis of digits and no clubbing  PSYCH: alert & oriented x 3, fluent speech NEURO: no focal motor/sensory deficits  LABORATORY DATA:  I have reviewed the data as listed    Latest Ref Rng & Units 08/29/2022   10:25 AM 07/30/2022    8:32 AM 07/30/2022    8:29 AM  CBC  WBC 4.0 - 10.5 K/uL 9.4     Hemoglobin 12.0 - 15.0 g/dL 11.2  10.5  10.9    10.5   Hematocrit 36.0 - 46.0 % 34.9  31.0  32.0    31.0   Platelets 150 - 400 K/uL 281          Latest Ref Rng & Units 08/29/2022   10:25 AM 08/16/2022   11:44 AM 07/30/2022    8:32 AM  CMP  Glucose 70 - 99 mg/dL 109  172    BUN 8 - 23  mg/dL 13  12    Creatinine 0.44 - 1.00 mg/dL 0.42  0.46    Sodium 135 - 145 mmol/L 137  138  140   Potassium 3.5 - 5.1 mmol/L 4.4  4.5  3.7   Chloride 98 - 111 mmol/L 102  102    CO2 22 - 32 mmol/L 28  20    Calcium 8.9 - 10.3 mg/dL 9.3  8.8    Total Protein 6.5 - 8.1 g/dL 6.7     Total Bilirubin 0.3 - 1.2 mg/dL 0.3     Alkaline Phos 38 - 126 U/L 88     AST 15 - 41 U/L 19     ALT 0 - 44 U/L 14        ASSESSMENT & PLAN Michelle [name] 72 y.o. female with medical history significant for rheumatoid arthritis, carpal tunnel syndrome, diverticulosis, hypertension, gastroparesis, GERD, restless leg syndrome, and hyperlipidemia who presents for evaluation of anemia of unclear etiology.  After review of the labs, review of the records, and discussion with the patient the patients findings are most consistent with iron deficiency anemia of unclear cause, suspect GI bleeding.  Additionally is possible the patient has anemia of chronic disease due to her rheumatoid arthritis.  # Iron Deficiency Anemia 2/2 to Unclear Cause, suspect GI Bleeding -- Findings are consistent with iron deficiency anemia, etiology is unclear.  --Encouraged her to follow-up with GI if she is found to be iron deficient.  --We will confirm iron deficiency anemia by ordering iron panel and ferritin as well as reticulocytes, CBC, and CMP -- Will also check ESR, CRP, and other nutritional levels such as vitamin B12 and folate --We will plan to proceed with IV iron therapy in order to help bolster the patient's blood counts if she is found to have low iron.  Unlikely that she will absorb p.o. iron well with her rheumatoid arthritis. --Possible that her findings represent anemia of chronic disease due to her rheumatoid arthritis inflammation.  If that is the case and her iron levels are normal IV iron therapy will not help. --Plan for return to clinic in 4 to 6 weeks time after last dose of IV iron   Orders Placed This Encounter   Procedures   CBC with Differential ([place] Only)    Standing Status:   Future    Number of Occurrences:   1    Standing Expiration Date:   08/29/2023   CMP ([place] only)    Standing Status:   Future    Number of Occurrences:   1    Standing Expiration Date:   08/29/2023   Ferritin    Standing Status:   Future    Number of Occurrences:   1    Standing Expiration Date:   08/29/2023   Iron and Iron Binding Capacity ([facility] only)    Standing Status:   Future    Number of Occurrences:   1    Standing Expiration Date:   08/29/2023   Retic Panel    Standing Status:   Future    Number of Occurrences:   1    Standing Expiration Date:   08/29/2023   Vitamin B12    Standing Status:   Future    Number of Occurrences:   1    Standing Expiration Date:   08/29/2023   Folate, Serum    Standing Status:   Future    Number of Occurrences:   1    Standing Expiration Date:   08/29/2023   Methylmalonic acid, serum    Standing Status:   Future    Number of Occurrences:   1    Standing Expiration Date:   08/29/2023   Multiple Myeloma Panel (SPEP&IFE w/QIG)    Standing  Status:   Future    Number of Occurrences:   1    Standing Expiration Date:   08/29/2023   Kappa/lambda light chains    Standing Status:   Future    Number of Occurrences:   1    Standing Expiration Date:   08/29/2023    All questions were answered. The patient knows to call the clinic with any problems, questions or concerns.  A total of more than 60 minutes were spent on this encounter with face-to-face time and non-face-to-face time, including preparing to see the patient, ordering tests and/or medications, counseling the patient and coordination of care as outlined above.   Ledell Peoples, MD Department of Hematology/Oncology [facility] Phone: [phone] Pager: [phone] Email: [email or web address]  09/01/2022 4:20 PM

## 2022-08-29 NOTE — Telephone Encounter (Signed)
Patient calls nurse line reporting difficulty picking up Trulicty 1.'5mg'$  dose.   I called the pharmacy and the pharmacist reports they have no stock. She reports this particular dosage is on back order until further notice.   She reports they have '3mg'$  and 4.'5mg'$ .   Will forward to PCP.

## 2022-08-30 LAB — KAPPA/LAMBDA LIGHT CHAINS
Kappa free light chain: 23 mg/L — ABNORMAL HIGH (ref 3.3–19.4)
Kappa, lambda light chain ratio: 1.24 (ref 0.26–1.65)
Lambda free light chains: 18.6 mg/L (ref 5.7–26.3)

## 2022-08-30 MED ORDER — DULAGLUTIDE 0.75 MG/0.5ML ~~LOC~~ SOAJ: 0.7500 mg | SUBCUTANEOUS | 0 refills | Status: DC

## 2022-08-30 NOTE — Telephone Encounter (Signed)
Pt informed that refill was sent. Christen Bame, CMA

## 2022-08-30 NOTE — Telephone Encounter (Signed)
Pt informed.  She will need a refill of the 0.75 dose. Christen Bame, CMA

## 2022-09-01 ENCOUNTER — Encounter: Payer: Self-pay | Admitting: Hematology and Oncology

## 2022-09-02 ENCOUNTER — Telehealth: Payer: Self-pay | Admitting: Hematology and Oncology

## 2022-09-02 ENCOUNTER — Telehealth: Payer: Self-pay | Admitting: Family Medicine

## 2022-09-02 ENCOUNTER — Telehealth: Payer: Self-pay

## 2022-09-02 DIAGNOSIS — J45909 Unspecified asthma, uncomplicated: Secondary | ICD-10-CM | POA: Diagnosis not present

## 2022-09-02 NOTE — Telephone Encounter (Signed)
Per 3/4 IB reached out to patient to schedule , patient asked that I called back around 1pm so she can look at her calendar.

## 2022-09-02 NOTE — Telephone Encounter (Addendum)
Called patient to advise of message below.   ----- Message from Orson Slick, MD sent at 09/01/2022  4:21 PM EST ----- Please let Mrs. Najafi know her lab findings are most consistent with iron deficiency anemia.  We will set her up for an IV iron infusion within the next week.  Will plan to see her approximately 4 to 6 weeks after last dose of IV iron in order to assure it was effective.  ----- Message ----- From: Buel Ream, Lab In [place] Sent: 08/29/2022  10:30 AM EST To: Orson Slick, MD

## 2022-09-02 NOTE — Telephone Encounter (Signed)
Per 3/4 IB called patient to schedule , patient aware of date and time of appointment.

## 2022-09-04 LAB — MULTIPLE MYELOMA PANEL, SERUM
Albumin SerPl Elph-Mcnc: 3.7 g/dL (ref 2.9–4.4)
Albumin/Glob SerPl: 1.3 (ref 0.7–1.7)
Alpha 1: 0.3 g/dL (ref 0.0–0.4)
Alpha2 Glob SerPl Elph-Mcnc: 0.9 g/dL (ref 0.4–1.0)
B-Globulin SerPl Elph-Mcnc: 1.2 g/dL (ref 0.7–1.3)
Gamma Glob SerPl Elph-Mcnc: 0.6 g/dL (ref 0.4–1.8)
Globulin, Total: 3 g/dL (ref 2.2–3.9)
IgA: 347 mg/dL (ref 64–422)
IgG (Immunoglobin G), Serum: 569 mg/dL — ABNORMAL LOW (ref 586–1602)
IgM (Immunoglobulin M), Srm: 42 mg/dL (ref 26–217)
Total Protein ELP: 6.7 g/dL (ref 6.0–8.5)

## 2022-09-04 LAB — METHYLMALONIC ACID, SERUM: Methylmalonic Acid, Quantitative: 137 nmol/L (ref 0–378)

## 2022-09-05 ENCOUNTER — Ambulatory Visit
Admission: RE | Admit: 2022-09-05 | Discharge: 2022-09-05 | Disposition: A | Discharge status: Home / Self Care | Payer: 59 | Source: Ambulatory Visit | Attending: Neurology | Admitting: Neurology

## 2022-09-05 DIAGNOSIS — Z1231 Encounter for screening mammogram for malignant neoplasm of breast: Secondary | ICD-10-CM

## 2022-09-06 ENCOUNTER — Other Ambulatory Visit: Payer: Self-pay | Admitting: Physician Assistant

## 2022-09-06 ENCOUNTER — Other Ambulatory Visit: Payer: Self-pay

## 2022-09-06 ENCOUNTER — Other Ambulatory Visit: Payer: Self-pay | Admitting: Family Medicine

## 2022-09-07 ENCOUNTER — Other Ambulatory Visit: Payer: Self-pay | Admitting: Family Medicine

## 2022-09-09 ENCOUNTER — Telehealth: Payer: Self-pay | Admitting: Pharmacy Technician

## 2022-09-09 ENCOUNTER — Other Ambulatory Visit: Payer: Self-pay | Admitting: Pharmacy Technician

## 2022-09-09 NOTE — Telephone Encounter (Signed)
Michelle [name],  Monoferric is non preferred with [facility] and wil be denied if patient has not failed or tried preferred medication. Preferred medication is venofer. Would you like to try venofer?

## 2022-09-11 DIAGNOSIS — M153 Secondary multiple arthritis: Secondary | ICD-10-CM | POA: Diagnosis not present

## 2022-09-11 DIAGNOSIS — Z79899 Other long term (current) drug therapy: Secondary | ICD-10-CM | POA: Diagnosis not present

## 2022-09-11 DIAGNOSIS — I5020 Unspecified systolic (congestive) heart failure: Secondary | ICD-10-CM | POA: Diagnosis not present

## 2022-09-11 DIAGNOSIS — Z5181 Encounter for therapeutic drug level monitoring: Secondary | ICD-10-CM | POA: Diagnosis not present

## 2022-09-11 DIAGNOSIS — M059 Rheumatoid arthritis with rheumatoid factor, unspecified: Secondary | ICD-10-CM | POA: Diagnosis not present

## 2022-09-11 DIAGNOSIS — D509 Iron deficiency anemia, unspecified: Secondary | ICD-10-CM | POA: Diagnosis not present

## 2022-09-12 ENCOUNTER — Ambulatory Visit (INDEPENDENT_AMBULATORY_CARE_PROVIDER_SITE_OTHER): Payer: 59

## 2022-09-12 VITALS — BP 96/62 | HR 83 | Temp 97.8°F | Resp 18 | Ht 62.0 in | Wt 124.6 lb

## 2022-09-12 DIAGNOSIS — D500 Iron deficiency anemia secondary to blood loss (chronic): Secondary | ICD-10-CM

## 2022-09-12 DIAGNOSIS — K922 Gastrointestinal hemorrhage, unspecified: Secondary | ICD-10-CM | POA: Diagnosis not present

## 2022-09-12 MED ORDER — SODIUM CHLORIDE 0.9 % IV SOLN
200.0000 mg | Freq: Once | INTRAVENOUS | Status: AC
  Administered 2022-09-12: 200 mg via INTRAVENOUS
  Filled 2022-09-12: qty 10

## 2022-09-12 MED ORDER — ACETAMINOPHEN 325 MG PO TABS
650.0000 mg | ORAL_TABLET | Freq: Once | ORAL | Status: AC
  Administered 2022-09-12: 650 mg via ORAL
  Filled 2022-09-12: qty 2

## 2022-09-12 MED ORDER — DIPHENHYDRAMINE HCL 25 MG PO CAPS
25.0000 mg | ORAL_CAPSULE | Freq: Once | ORAL | Status: AC
  Administered 2022-09-12: 25 mg via ORAL
  Filled 2022-09-12: qty 1

## 2022-09-12 NOTE — Patient Instructions (Signed)

## 2022-09-12 NOTE — Progress Notes (Signed)
Diagnosis: Iron Deficiency Anemia  Provider:  Marshell Garfinkel MD  Procedure: Infusion  IV Type: Peripheral, IV Location: L Antecubital  Venofer (Iron Sucrose), Dose: 200 mg  Infusion Start Time: [id number]  Infusion Stop Time: 1030  Post Infusion IV Care: Observation period completed and Peripheral IV Discontinued  Discharge: Condition: Good, Destination: Home . AVS Provided  Performed by:  Cleophus Molt, RN

## 2022-09-12 NOTE — Progress Notes (Signed)
I connected with  Michelle [name] on 09/13/2022 by a audio enabled telemedicine application and verified that I am speaking with the correct person using two identifiers.  Patient Location: Home  Provider Location: Home Office  I discussed the limitations of evaluation and management by telemedicine. The patient expressed understanding and agreed to proceed.   Subjective:   Michelle [name] is a 72 y.o. female who presents for an Initial Medicare Annual Wellness Visit.  Review of Systems    Per HPI unless specifically indicated below. Cardiac Risk Factors include: advanced age (>83men, >28 women);female gender, Hypertension associated with diabetes, heart failure with reduced ejection fraction, and Hyperlipidemia.           Objective:       09/12/2022   10:54 AM 09/12/2022    9:39 AM 08/29/2022    9:00 AM  Vitals with BMI  Height  5\' 2"    Weight  124 lbs 10 oz 126 lbs  BMI  [id number]  Systolic 96 [id number]  Diastolic 62 62 64  Pulse 83 80 86    Today's Vitals   09/13/22 0911  PainSc: 0-No pain   There is no height or weight on file to calculate BMI.     09/13/2022    9:18 AM 08/29/2022    9:18 AM 08/26/2022    8:24 AM 08/12/2022    8:32 AM 08/09/2022   10:24 AM 07/19/2022   11:42 AM 07/16/2022   10:12 AM  Advanced Directives  Does Patient Have a Medical Advance Directive? No No No No No No No  Would patient like information on creating a medical advance directive? No - Patient declined No - Patient declined No - Patient declined  No - Patient declined No - Patient declined No - Patient declined    Current Medications (verified) Outpatient Encounter Medications as of 09/13/2022  Medication Sig   Accu-Chek Softclix Lancets lancets Please use to check blood sugar up to two times per day. E11.42   acetaminophen (TYLENOL) 650 MG CR tablet Take 650 mg by mouth every 8 (eight) hours as needed for pain.   aspirin EC 81 MG tablet Take 1 tablet (81 mg total) by mouth  daily. Swallow whole.   betamethasone dipropionate 0.05 % lotion Apply topically as directed. Apply to the scalp daily up to 5 days for scalp (Patient taking differently: Apply 1 application  topically daily as needed (scalp irritation). Apply to the scalp daily up to 5 days for scalp)   Blood Glucose Monitoring Suppl (ONE TOUCH ULTRA 2) w/Device KIT USE AS DIRECTED   clobetasol (TEMOVATE) 0.05 % external solution Apply 1 application  topically daily. To scalp up to 5 times per week   cromolyn (OPTICROM) 4 % ophthalmic solution Place 1 drop into both eyes 3 (three) times daily as needed (itchy eyes).   diclofenac Sodium (VOLTAREN) 1 % GEL Apply 2 g topically 4 (four) times daily.   Dulaglutide 0.75 MG/0.5ML SOPN Inject 0.75 mg into the skin once a week.   fluticasone (FLONASE) 50 MCG/ACT nasal spray Place 2 sprays into both nostrils daily as needed for allergies.   fluticasone furoate-vilanterol (BREO ELLIPTA) 100-25 MCG/ACT AEPB TAKE 1 PUFF BY MOUTH EVERY DAY   gabapentin (NEURONTIN) 300 MG capsule TAKE 1 CAPSULE BY MOUTH EVERYDAY AT BEDTIME   glucose blood test strip Use as instructed a few times a week as needed   glucose blood test strip Use to check blood glucose 2 times  a day as needed   leflunomide (ARAVA) 10 MG tablet Take 10 mg by mouth daily.   metFORMIN (GLUCOPHAGE-XR) 500 MG 24 hr tablet TAKE 2 TABLETS BY MOUTH TWICE A DAY   metoprolol succinate (TOPROL-XL) 25 MG 24 hr tablet Take 1 tablet (25 mg total) by mouth at bedtime.   mirabegron ER (MYRBETRIQ) 50 MG TB24 tablet Take 50 mg by mouth in the morning.   mometasone (ELOCON) 0.1 % cream APPLY 1 APPLICATION TOPICALLY DAILY AS NEEDED (RASH).   mometasone-formoterol (DULERA) 100-5 MCG/ACT AERO Inhale 2 puffs into the lungs 2 (two) times daily.   montelukast (SINGULAIR) 10 MG tablet TAKE 1 TABLET BY MOUTH EVERY DAY (Patient taking differently: Take 10 mg by mouth at bedtime.)   mupirocin ointment (BACTROBAN) 2 % APPLY TOPICALLY ONCE DAILY  TO OPEN SORES ON ARMS, LEGS AND TOES UNTIL HEALED. THEN APPLY TOPICALLY TO AFFECTED AREAS AS NEEDED FOR FLARES   nitroGLYCERIN (NITROSTAT) 0.4 MG SL tablet Place 1 tablet (0.4 mg total) under the tongue every 5 (five) minutes as needed.   omeprazole (PRILOSEC) 40 MG capsule Take 1 capsule (40 mg total) by mouth in the morning.   oxyCODONE-acetaminophen (PERCOCET) 10-325 MG tablet Take 1 tablet by mouth every 6 (six) hours as needed for pain.   [START ON 10/14/2022] oxyCODONE-acetaminophen (PERCOCET) 10-325 MG tablet Take 1 tablet by mouth every 6 (six) hours as needed for pain.   [START ON 11/14/2022] oxyCODONE-acetaminophen (PERCOCET) 10-325 MG tablet Take 1 tablet by mouth every 6 (six) hours as needed for pain.   rosuvastatin (CRESTOR) 10 MG tablet Take 1 tablet (10 mg total) by mouth daily. (Patient taking differently: Take 10 mg by mouth every evening.)   sacubitril-valsartan (ENTRESTO) 24-26 MG Take 1 tablet by mouth 2 (two) times daily.   sertraline (ZOLOFT) 50 MG tablet Take 1 tablet (50 mg total) by mouth daily.   VENTOLIN HFA 108 (90 Base) MCG/ACT inhaler INHALE 2 PUFFS BY MOUTH EVERY 4 HOURS AS NEEDED FOR WHEEZING OR SHORTNESS OF BREATH   oxyCODONE-acetaminophen (PERCOCET) 10-325 MG tablet Take 1 tablet by mouth every 6 (six) hours as needed for pain.   No facility-administered encounter medications on file as of 09/13/2022.    Allergies (verified) Zofran [ondansetron hcl], Ace inhibitors, Levofloxacin, Lisinopril, Sulfonamide derivatives, and Sulfa antibiotics   History: Past Medical History:  Diagnosis Date   Actinic keratosis    Allergic rhinitis 08/28/2006   Anemia    low iron   Anginal pain (HCC)    Asthma, chronic 03/14/2010   Does seem to use her albuterol excessively. Consider change to more manageable medicines and treat more like COPD in the future.   Carpal tunnel syndrome, left 05/29/2009   Last Assessment & Plan:  Advised to wear wrist brace at night when she is  sleeping.  If this does not help will consider having her go to sports medicine.   Chronic frontal sinusitis 08/28/2016   Chronic left maxillary sinusitis 08/28/2016   Diverticulosis    Essential hypertension 10/30/2012   Gastroparesis 11/19/2011   DM and chronic narcotics contribute.  Causes dysphagia like symptoms    Generalized anxiety disorder 08/28/2006   GERD (gastroesophageal reflux disease)    Hearing loss 07/19/2015   Hearing aid in right ear   History of cataracts, bilateral    Hyperlipidemia 08/28/2006   Lumbar radiculopathy 04/07/2018   Lung nodules    Osteoporosis 08/08/2011   Pain in joint, pelvic region and thigh 11/18/2012   Radial styloid  tenosynovitis 08/15/2013   Restless leg syndrome 05/14/2012   Seropositive rheumatoid arthritis 01/08/2010   Patient is being followed by [facility]ologist at [facility] patient is on Plaquenil and as well as prednisone.   Changed to methotrexate and prednisone in May of 2013  Patient does have a pain contract here with [facility] receives oxycodone 10/325 mg tabs every 6 hours. This has been titrated down from significant doses of methadone previously. Could attempt to tit   Shoulder joint pain 03/25/2011   The patient is compensating due to her not being able to use her left arm right now rotator cuff impingement if he continues to worsen would not be surprised if she does get a tear on this side as well   Squamous cell carcinoma in situ of skin 11/25/2014   Referred to dermatology for excision    Squamous cell carcinoma of skin 12/26/2014   Right distal pretibial. SCC-KA pattern.   Squamous cell carcinoma of skin 03/27/2015   Right inf. lat. knee. SCC-KA pattern.    Squamous cell carcinoma of skin 10/07/2016   Left lateral calf superior. WD SCC.   Squamous cell carcinoma of skin 10/07/2016   Left lateral calf inferior. WD SCC.   Squamous cell carcinoma of skin 04/28/2018   Left mid med. pretibial.  WD SCC.   Squamous cell carcinoma of skin 08/12/2019   Left mid med. pretibial sup. KA type. EDC   Squamous cell carcinoma of skin 08/12/2019   Left mid med. pretibial inferior. WD. EDC.   Tear of medial meniscus of knee, left 04/27/2009   Type II diabetes mellitus 08/28/2006   Overview:  Patient has her ups and downs, go in line with flares of her RA and prednisone uses.  Patient does have hx of hypoglycemic events so would not try for perfect control but should have A1c goal around 7.0 Lab Results  Component Value Date   HGBA1C 6.9 12/27/2011   Last Assessment & Plan:  At goal. Will continue current metformin   Past Surgical History:  Procedure Laterality Date   ABDOMINAL HYSTERECTOMY     APPENDECTOMY     BACK SURGERY     BIOPSY  05/10/2021   Procedure: BIOPSY;  Surgeon: Milus Banister, MD;  Location: [facility] ENDOSCOPY;  Service: Endoscopy;;  EGD and COLON   CARPAL TUNNEL RELEASE Right    CATARACT EXTRACTION Left    with lid lift   CHOLECYSTECTOMY     COLONOSCOPY     COLONOSCOPY WITH PROPOFOL N/A 05/10/2021   Procedure: COLONOSCOPY WITH PROPOFOL;  Surgeon: Milus Banister, MD;  Location: [facility] ENDOSCOPY;  Service: Endoscopy;  Laterality: N/A;   ESOPHAGOGASTRODUODENOSCOPY (EGD) WITH PROPOFOL N/A 05/10/2021   Procedure: ESOPHAGOGASTRODUODENOSCOPY (EGD) WITH PROPOFOL;  Surgeon: Milus Banister, MD;  Location: [facility] ENDOSCOPY;  Service: Endoscopy;  Laterality: N/A;   HERNIA REPAIR     KNEE SURGERY Right    x2- cartilage annd scar tissue   LEFT AND RIGHT HEART CATHETERIZATION WITH CORONARY ANGIOGRAM N/A 08/17/2013   Procedure: LEFT AND RIGHT HEART CATHETERIZATION WITH CORONARY ANGIOGRAM;  Surgeon: Laverda Page, MD;  Location: [facility] CATH LAB;  Service: Cardiovascular;  Laterality: N/A;   lumbar back surgery  04/04/2018   done by neurosurgeron   right foot bone spurs removed     RIGHT/LEFT HEART CATH AND CORONARY ANGIOGRAPHY N/A 07/30/2022   Procedure: RIGHT/LEFT HEART CATH AND CORONARY  ANGIOGRAPHY;  Surgeon: Nigel Mormon, MD;  Location: [facility]  LAB;  Service: Cardiovascular;  Laterality: N/A;   ROTATOR CUFF REPAIR Right    SHOULDER SURGERY Left    x2- rotatar cuff tear   UPPER GASTROINTESTINAL ENDOSCOPY     Family History  Problem Relation Age of Onset   Stroke Father 28   Colon cancer Maternal Aunt    Tremor Mother    Lung cancer Brother 65   Rheum arthritis Sister 25   Colon cancer Daughter    COPD Sister    Cancer Sister        in nose   Diabetes Daughter    Hypertension Daughter    Healthy Daughter    Bell's palsy Maternal Grandmother    Esophageal cancer Neg Hx    Rectal cancer Neg Hx    Stomach cancer Neg Hx    Social History   Socioeconomic History   Marital status: Married    Spouse name: Programme researcher, broadcasting/film/video   Number of children: 3   Years of education: 9   Highest education level: 9th grade  Occupational History   Occupation: Retired/disabled    Fish farm manager: UNEMPLOYED  Tobacco Use   Smoking status: Former    Packs/day: 1.50    Years: 5.00    Additional pack years: 0.00    Total pack years: 7.50    Types: Cigarettes    Quit date: 05/31/1994    Years since quitting: 28.3    Passive exposure: Past   Smokeless tobacco: Never  Vaping Use   Vaping Use: Never used  Substance and Sexual Activity   Alcohol use: No    Alcohol/week: 0.0 standard drinks of alcohol   Drug use: No   Sexual activity: Not on file  Other Topics Concern   Not on file  Social History Narrative   Pt is on [employer] program [place]   Lives with husband and granddgt.   Lots of family stressors including special-needs grandaughter who lives with them.      Advance Directives: None - Patient's plan is that her husband will make decisions for her if she were not capable of making informed decisions for herself. (Discussion with Sherren Mocha McDiarmid, MD 12/11/18)   Full Code: Desires CPR/ACLS  (Discussion with Sherren Mocha McDiarmid, MD 12/11/18)         Social Determinants of  Health   Financial Resource Strain: Low Risk  (09/13/2022)   Overall Financial Resource Strain (CARDIA)    Difficulty of Paying Living Expenses: Not hard at all  Food Insecurity: No Food Insecurity (09/13/2022)   Hunger Vital Sign    Worried About Running Out of Food in the Last Year: Never true    Ran Out of Food in the Last Year: Never true  Transportation Needs: No Transportation Needs (09/13/2022)   PRAPARE - Hydrologist (Medical): No    Lack of Transportation (Non-Medical): No  Physical Activity: Inactive (09/13/2022)   Exercise Vital Sign    Days of Exercise per Week: 0 days    Minutes of Exercise per Session: 0 min  Stress: No Stress Concern Present (09/13/2022)   [place]    Feeling of Stress : Not at all  Social Connections: Moderately Isolated (09/13/2022)   Social Connection and Isolation Panel [NHANES]    Frequency of Communication with Friends and Family: More than three times a week    Frequency of Social Gatherings with Friends and Family: More than three times a week  Attends Religious Services: Never    Active Member of Clubs or Organizations: No    Attends Archivist Meetings: Never    Marital Status: Married    Tobacco Counseling Counseling given: No   Clinical Intake:  Pre-visit preparation completed: No  Pain : No/denies pain Pain Score: 0-No pain     Nutritional Status: BMI of 19-24  Normal Nutritional Risks: None Diabetes: Yes CBG done?: Yes CBG resulted in Enter/ Edit results?: No Did pt. bring in CBG monitor from home?: No     Diabetic?Nutrition Risk Assessment:  Has the patient had any N/V/D within the last 2 months?  No  Does the patient have any non-healing wounds?  No  Has the patient had any unintentional weight loss or weight gain?  Yes   Diabetes:  Is the patient diabetic?  Yes  If diabetic, was a CBG obtained today?  No   Did the patient bring in their glucometer from home?  No  How often do you monitor your CBG's? Daily  .   Financial Strains and Diabetes Management:  Are you having any financial strains with the device, your supplies or your medication? No .  Does the patient want to be seen by Chronic Care Management for management of their diabetes?  No  Would the patient like to be referred to a Nutritionist or for Diabetic Management?  No   Diabetic Exams:  Diabetic Eye Exam: Overdue for diabetic eye exam. Pt has been advised about the importance in completing this exam. Patient advised to call and schedule an eye exam. Diabetic Foot Exam: Completed 07/12/2022          Activities of Daily Living    09/13/2022    9:08 AM 02/07/2022    9:25 AM  In your present state of health, do you have any difficulty performing the following activities:  Hearing? 1   Comment hearing aid   Vision? 1   Difficulty concentrating or making decisions? 1   Walking or climbing stairs? 1   Dressing or bathing? 0   Doing errands, shopping? 0 0    Patient Care Team: [name], MD as PCP - General (Family Medicine) Alda Berthold, DO as Consulting Physician (Neurology)  Indicate any recent Medical Services you may have received from other than [facility] providers in the past year (date may be approximate).     Assessment:   This is a routine wellness examination for Tiani.  Hearing/Vision screen Admits to some hearing loss. Wear a unilateral hearing aid, but stated she has to replace it. Denies any change to her vision. Overdue for  Annual Eye Exam Dr. Schuyler Amor.   Dietary issues and exercise activities discussed: Current Exercise Habits: The patient has a physically strenuous job, but has no regular exercise apart from work., Exercise limited by: orthopedic condition(s)   Goals Addressed   None    Depression Screen    09/13/2022    9:06 AM 08/26/2022    8:25 AM 08/09/2022   10:25 AM 07/19/2022   11:42  AM 07/16/2022   11:08 AM 06/26/2022   10:39 AM 06/21/2022    8:51 AM  PHQ 2/9 Scores  PHQ - 2 Score 0 2 1 0 0 0 0  PHQ- 9 Score 5 6 4  0 1 0 2    Fall Risk    09/13/2022    9:08 AM 08/26/2022    8:31 AM 08/12/2022    8:30 AM 08/09/2022   10:26 AM 07/19/2022  11:43 AM  Fall Risk   Falls in the past year? 1 1 1 1 1   Number falls in past yr: 0 0 1 0 0  Injury with Fall? 1 1 0 1 1  Risk for fall due to : Impaired balance/gait;Impaired mobility  History of fall(s)    Follow up Falls evaluation completed  Falls evaluation completed      FALL RISK PREVENTION PERTAINING TO THE HOME:  Any stairs in or around the home? Yes  If so, are there any without handrails? No  Home free of loose throw rugs in walkways, pet beds, electrical cords, etc? Yes  Adequate lighting in your home to reduce risk of falls? Yes   ASSISTIVE DEVICES UTILIZED TO PREVENT FALLS:  Life alert? No  Use of a cane, walker or w/c? No  Grab bars in the bathroom? Yes  Shower chair or bench in shower? No  Elevated toilet seat or a handicapped toilet? No   TIMED UP AND GO:  Was the test performed? Unable to perform, virtual appointment      Cognitive Function:      08/12/2022   10:00 AM 12/11/2018   11:33 AM  Montreal Cognitive Assessment   Visuospatial/ Executive (0/5) 3 2  Naming (0/3) 3 3  Attention: Read list of digits (0/2) 2 2  Attention: Read list of letters (0/1) 1 0  Attention: Serial 7 subtraction starting at 100 (0/3) 3 1  Language: Repeat phrase (0/2) 2 1  Language : Fluency (0/1) 1 1  Abstraction (0/2) 2 2  Delayed Recall (0/5) 5 5  Orientation (0/6) 6 5  Total 28 22  Adjusted Score (based on education) 29 23      09/13/2022    9:10 AM  6CIT Screen  What Year? 0 points  What month? 0 points  What time? 0 points  Count back from 20 0 points  Months in reverse 0 points  Repeat phrase 0 points  Total Score 0 points    Immunizations Immunization History  Administered Date(s)  Administered   COVID-19, mRNA, vaccine(Comirnaty)12 years and older 03/31/2022, 04/16/2022   Fluad Quad(high Dose 65+) 03/02/2020, 04/18/2021   Influenza Split 03/13/2012, 04/16/2022   Influenza Whole 06/12/2007, 04/05/2008, 05/02/2009, 04/06/2010   Influenza, High Dose Seasonal PF 03/13/2018, 02/18/2019   Influenza,inj,Quad PF,6+ Mos 03/16/2013, 03/02/2014, 04/12/2015, 04/02/2016, 03/26/2017   Influenza-Unspecified 03/30/2013, 04/03/2015, 03/01/2017, 03/13/2018, 02/27/2019   Meningococcal polysaccharide vaccine (MPSV4) 04/03/2015   PFIZER Comirnaty(Gray Top)Covid-19 Tri-Sucrose Vaccine 10/19/2020   PFIZER(Purple Top)SARS-COV-2 Vaccination 08/14/2019, 09/08/2019, 03/02/2020   PPD Test 03/11/2012   Pfizer Covid-19 Vaccine Bivalent Booster 75yrs & up 04/30/2021, 04/30/2021   Pneumococcal Conjugate-13 04/12/2015, 09/19/2016   Pneumococcal Polysaccharide-23 07/20/2015, 10/01/2017   Rsv, Bivalent, Protein Subunit Rsvpref,pf Evans Lance) 04/16/2022   Td 01/03/2005   Tdap 10/01/2011   Unspecified SARS-COV-2 Vaccination 04/16/2022   Zoster Recombinat (Shingrix) 04/16/2022    TDAP status: Due, Education has been provided regarding the importance of this vaccine. Advised may receive this vaccine at local pharmacy or Health Dept. Aware to provide a copy of the vaccination record if obtained from local pharmacy or Health Dept. Verbalized acceptance and understanding.  Flu Vaccine status: Up to date  Pneumococcal vaccine status: Up to date  Covid-19 vaccine status: Information provided on how to obtain vaccines.   Qualifies for Shingles Vaccine? Yes   Zostavax completed Yes   Shingrix Completed?: Yes  Screening Tests Health Maintenance  Topic Date Due   Diabetic kidney evaluation - Urine ACR  Never done   OPHTHALMOLOGY EXAM  01/16/2021   DTaP/Tdap/Td (3 - Td or Tdap) 09/30/2021   COVID-19 Vaccine (7 - 2023-24 season) 06/11/2022   HEMOGLOBIN A1C  02/24/2023   FOOT EXAM  07/13/2023    Diabetic kidney evaluation - eGFR measurement  08/29/2023   Medicare Annual Wellness (AWV)  09/13/2023   MAMMOGRAM  09/04/2024   COLONOSCOPY (Pts 45-55yrs Insurance coverage will need to be confirmed)  05/10/2026   Pneumonia Vaccine 33+ Years old  Completed   INFLUENZA VACCINE  Completed   DEXA SCAN  Completed   Hepatitis C Screening  Completed   Zoster Vaccines- Shingrix  Completed   HPV VACCINES  Aged Out    Health Maintenance  Health Maintenance Due  Topic Date Due   Diabetic kidney evaluation - Urine ACR  Never done   OPHTHALMOLOGY EXAM  01/16/2021   DTaP/Tdap/Td (3 - Td or Tdap) 09/30/2021   COVID-19 Vaccine (7 - 2023-24 season) 06/11/2022    Colorectal cancer screening: Type of screening: Colonoscopy. Completed 05/10/2021. Repeat every 5 years  Mammogram status: Completed 09/05/2022. Repeat every year  DEXA Scan: completed 08/15/2010  Lung Cancer Screening: (Low Dose CT Chest recommended if Age 103-80 years, 30 pack-year currently smoking OR have quit w/in 15years.) does not qualify.   Lung Cancer Screening Referral: not applicable   Additional Screening:  Hepatitis C Screening: does qualify; Completed 07/22/2016  Vision Screening: Recommended annual ophthalmology exams for early detection of glaucoma and other disorders of the eye. Is the patient up to date with their annual eye exam?  No  Who is the provider or what is the name of the office in which the patient attends annual eye exams? Dr. Schuyler Amor  If pt is not established with a provider, would they like to be referred to a provider to establish care? No .   Dental Screening: Recommended annual dental exams for proper oral hygiene  Community Resource Referral / Chronic Care Management: CRR required this visit?  No   CCM required this visit?  No      Plan:     I have personally reviewed and noted the following in the patient's chart:   Medical and social history Use of alcohol, tobacco or illicit drugs   Current medications and supplements including opioid prescriptions. Patient is currently taking opioid prescriptions. Information provided to patient regarding non-opioid alternatives. Patient advised to discuss non-opioid treatment plan with their provider. Functional ability and status Nutritional status Physical activity Advanced directives List of other physicians Hospitalizations, surgeries, and ER visits in previous 12 months Vitals Screenings to include cognitive, depression, and falls Referrals and appointments  In addition, I have reviewed and discussed with patient certain preventive protocols, quality metrics, and best practice recommendations. A written personalized care plan for preventive services as well as general preventive health recommendations were provided to patient.    Ms. Georgi , Thank you for taking time to come for your Medicare Wellness Visit. I appreciate your ongoing commitment to your health goals. Please review the following plan we discussed and let me know if I can assist you in the future.   These are the goals we discussed:  Goals   None     This is a list of the screening recommended for you and due dates:  Health Maintenance  Topic Date Due   Yearly kidney health urinalysis for diabetes  Never done   Eye exam for diabetics  01/16/2021   DTaP/Tdap/Td vaccine (3 - Td or  Tdap) 09/30/2021   COVID-19 Vaccine (7 - 2023-24 season) 06/11/2022   Hemoglobin A1C  02/24/2023   Complete foot exam   07/13/2023   Yearly kidney function blood test for diabetes  08/29/2023   Medicare Annual Wellness Visit  09/13/2023   Mammogram  09/04/2024   Colon Cancer Screening  05/10/2026   Pneumonia Vaccine  Completed   Flu Shot  Completed   DEXA scan (bone density measurement)  Completed   Hepatitis C Screening: USPSTF Recommendation to screen - Ages 66-79 yo.  Completed   Zoster (Shingles) Vaccine  Completed   HPV Vaccine  Aged [address]  [place]   09/13/2022  Nurse Notes: Approximately 30 minute Non-Face -To-Face Medicare Wellness Visit

## 2022-09-12 NOTE — Patient Instructions (Signed)

## 2022-09-13 ENCOUNTER — Ambulatory Visit (INDEPENDENT_AMBULATORY_CARE_PROVIDER_SITE_OTHER): Payer: 59

## 2022-09-13 ENCOUNTER — Encounter: Payer: Self-pay | Admitting: Hematology and Oncology

## 2022-09-13 DIAGNOSIS — Z0000 Encounter for general adult medical examination without abnormal findings: Secondary | ICD-10-CM

## 2022-09-13 NOTE — Progress Notes (Signed)
I reviewed the Medications, Problem list, Past Medical, Surgical Histories, Family Histories, and Social Histories.  I reviewed the nurse note.  

## 2022-09-20 ENCOUNTER — Ambulatory Visit (INDEPENDENT_AMBULATORY_CARE_PROVIDER_SITE_OTHER): Payer: 59 | Admitting: *Deleted

## 2022-09-20 ENCOUNTER — Other Ambulatory Visit: Payer: Self-pay | Admitting: Dermatology

## 2022-09-20 VITALS — BP 125/78 | HR 87 | Temp 97.6°F | Resp 16 | Ht 62.0 in | Wt 125.6 lb

## 2022-09-20 DIAGNOSIS — T148XXA Other injury of unspecified body region, initial encounter: Secondary | ICD-10-CM

## 2022-09-20 DIAGNOSIS — D509 Iron deficiency anemia, unspecified: Secondary | ICD-10-CM | POA: Diagnosis not present

## 2022-09-20 DIAGNOSIS — D500 Iron deficiency anemia secondary to blood loss (chronic): Secondary | ICD-10-CM

## 2022-09-20 MED ORDER — ACETAMINOPHEN 325 MG PO TABS
650.0000 mg | ORAL_TABLET | Freq: Once | ORAL | Status: AC
  Administered 2022-09-20: 650 mg via ORAL
  Filled 2022-09-20: qty 2

## 2022-09-20 MED ORDER — SODIUM CHLORIDE 0.9 % IV SOLN
200.0000 mg | Freq: Once | INTRAVENOUS | Status: AC
  Administered 2022-09-20: 200 mg via INTRAVENOUS
  Filled 2022-09-20: qty 10

## 2022-09-20 MED ORDER — DIPHENHYDRAMINE HCL 25 MG PO CAPS
25.0000 mg | ORAL_CAPSULE | Freq: Once | ORAL | Status: AC
  Administered 2022-09-20: 25 mg via ORAL
  Filled 2022-09-20: qty 1

## 2022-09-20 NOTE — Progress Notes (Signed)
Diagnosis: Iron Deficiency Anemia  Provider:  Marshell Garfinkel MD  Procedure: Infusion  IV Type: Peripheral, IV Location: L Antecubital  Venofer (Iron Sucrose), Dose: 200 mg  Infusion Start Time: [id number]  Infusion Stop Time: 1050  Post Infusion IV Care: Patient declined observation and Peripheral IV Discontinued  Discharge: Condition: Good, Destination: Home . AVS Declined  Performed by:  Baxter Hire, RN

## 2022-09-24 LAB — HM DIABETES EYE EXAM

## 2022-09-25 ENCOUNTER — Other Ambulatory Visit: Payer: Self-pay | Admitting: Family Medicine

## 2022-09-25 ENCOUNTER — Ambulatory Visit: Payer: Medicare Other | Admitting: Dermatology

## 2022-09-26 ENCOUNTER — Other Ambulatory Visit: Payer: Self-pay | Admitting: Family Medicine

## 2022-09-26 ENCOUNTER — Ambulatory Visit (INDEPENDENT_AMBULATORY_CARE_PROVIDER_SITE_OTHER): Payer: 59

## 2022-09-26 ENCOUNTER — Ambulatory Visit: Payer: 59

## 2022-09-26 VITALS — BP 98/63 | HR 83 | Temp 98.2°F | Resp 14 | Ht 62.0 in | Wt 126.6 lb

## 2022-09-26 DIAGNOSIS — D500 Iron deficiency anemia secondary to blood loss (chronic): Secondary | ICD-10-CM

## 2022-09-26 DIAGNOSIS — D509 Iron deficiency anemia, unspecified: Secondary | ICD-10-CM

## 2022-09-26 MED ORDER — ACETAMINOPHEN 325 MG PO TABS: 650.0000 mg | ORAL_TABLET | Freq: Once | ORAL | Status: DC

## 2022-09-26 MED ORDER — DIPHENHYDRAMINE HCL 25 MG PO CAPS: 25.0000 mg | ORAL_CAPSULE | Freq: Once | ORAL | Status: DC

## 2022-09-26 MED ORDER — SODIUM CHLORIDE 0.9 % IV SOLN
200.0000 mg | Freq: Once | INTRAVENOUS | Status: AC
  Administered 2022-09-26: 200 mg via INTRAVENOUS
  Filled 2022-09-26: qty 10

## 2022-09-26 NOTE — Progress Notes (Signed)
Diagnosis: Iron Deficiency Anemia  Provider:  Marshell Garfinkel MD  Procedure: Infusion  IV Type: Peripheral, IV Location: L Antecubital  Venofer (Iron Sucrose), Dose: 200 mg  Infusion Start Time: 0925  Infusion Stop Time: [id number]  Post Infusion IV Care: Patient declined observation and Peripheral IV Discontinued  Discharge: Condition: Stable, Destination: Home . AVS Declined  Performed by:  Binnie Kand, RN

## 2022-10-01 DIAGNOSIS — E119 Type 2 diabetes mellitus without complications: Secondary | ICD-10-CM | POA: Diagnosis not present

## 2022-10-03 ENCOUNTER — Encounter: Payer: Self-pay | Admitting: Hematology and Oncology

## 2022-10-03 ENCOUNTER — Ambulatory Visit (INDEPENDENT_AMBULATORY_CARE_PROVIDER_SITE_OTHER): Payer: 59

## 2022-10-03 VITALS — BP 100/62 | HR 75 | Temp 97.9°F | Resp 18 | Ht 62.0 in | Wt 124.8 lb

## 2022-10-03 DIAGNOSIS — D509 Iron deficiency anemia, unspecified: Secondary | ICD-10-CM | POA: Diagnosis not present

## 2022-10-03 DIAGNOSIS — D500 Iron deficiency anemia secondary to blood loss (chronic): Secondary | ICD-10-CM

## 2022-10-03 MED ORDER — SODIUM CHLORIDE 0.9 % IV SOLN
200.0000 mg | Freq: Once | INTRAVENOUS | Status: AC
  Administered 2022-10-03: 200 mg via INTRAVENOUS
  Filled 2022-10-03: qty 10

## 2022-10-03 MED ORDER — ACETAMINOPHEN 325 MG PO TABS
650.0000 mg | ORAL_TABLET | Freq: Once | ORAL | Status: AC
  Administered 2022-10-03: 650 mg via ORAL
  Filled 2022-10-03: qty 2

## 2022-10-03 MED ORDER — DIPHENHYDRAMINE HCL 25 MG PO CAPS
25.0000 mg | ORAL_CAPSULE | Freq: Once | ORAL | Status: AC
  Administered 2022-10-03: 25 mg via ORAL
  Filled 2022-10-03: qty 1

## 2022-10-03 NOTE — Progress Notes (Signed)
Diagnosis: Iron Deficiency Anemia  Provider:  Marshell Garfinkel MD  Procedure: Infusion  IV Type: Peripheral, IV Location: L Antecubital  Venofer (Iron Sucrose), Dose: 200 mg  Infusion Start Time: 0927  Infusion Stop Time: [id number]  Post Infusion IV Care: Peripheral IV Discontinued  Discharge: Condition: Good, Destination: Home . AVS Declined  Performed by:  Arnoldo Morale, RN

## 2022-10-10 ENCOUNTER — Ambulatory Visit (INDEPENDENT_AMBULATORY_CARE_PROVIDER_SITE_OTHER): Payer: 59

## 2022-10-10 VITALS — BP 103/65 | HR 82 | Temp 98.2°F | Resp 18 | Ht 62.0 in | Wt 126.4 lb

## 2022-10-10 DIAGNOSIS — D500 Iron deficiency anemia secondary to blood loss (chronic): Secondary | ICD-10-CM

## 2022-10-10 DIAGNOSIS — D509 Iron deficiency anemia, unspecified: Secondary | ICD-10-CM | POA: Diagnosis not present

## 2022-10-10 MED ORDER — ACETAMINOPHEN 325 MG PO TABS: 650.0000 mg | ORAL_TABLET | Freq: Once | ORAL | Status: DC

## 2022-10-10 MED ORDER — SODIUM CHLORIDE 0.9 % IV SOLN
200.0000 mg | Freq: Once | INTRAVENOUS | Status: AC
  Administered 2022-10-10: 200 mg via INTRAVENOUS
  Filled 2022-10-10: qty 10

## 2022-10-10 MED ORDER — DIPHENHYDRAMINE HCL 25 MG PO CAPS: 25.0000 mg | ORAL_CAPSULE | Freq: Once | ORAL | Status: DC

## 2022-10-10 NOTE — Progress Notes (Signed)
Diagnosis: Iron Deficiency Anemia  Provider:  Chilton Greathouse MD  Procedure: Injection  IV Type: Peripheral, IV Location: L Antecubital  Venofer (Iron Sucrose), Dose: 200 mg  Infusion Start Time: 0919  Infusion Stop Time: 0936  Post Infusion IV Care: Peripheral IV Discontinued  Discharge: Condition: Good, Destination: Home . AVS Declined  Performed by:  Adriana Mccallum, RN

## 2022-10-14 DIAGNOSIS — H26491 Other secondary cataract, right eye: Secondary | ICD-10-CM | POA: Diagnosis not present

## 2022-10-15 ENCOUNTER — Other Ambulatory Visit: Payer: Self-pay | Admitting: Family Medicine

## 2022-10-15 ENCOUNTER — Other Ambulatory Visit: Payer: Self-pay | Admitting: Dermatology

## 2022-10-15 DIAGNOSIS — L409 Psoriasis, unspecified: Secondary | ICD-10-CM

## 2022-10-16 LAB — HM DIABETES EYE EXAM

## 2022-10-18 ENCOUNTER — Ambulatory Visit: Payer: 59 | Admitting: Podiatry

## 2022-10-31 ENCOUNTER — Institutional Professional Consult (permissible substitution): Payer: 59 | Admitting: Internal Medicine

## 2022-11-04 ENCOUNTER — Ambulatory Visit (INDEPENDENT_AMBULATORY_CARE_PROVIDER_SITE_OTHER): Payer: 59 | Admitting: Sports Medicine

## 2022-11-04 VITALS — BP 114/78 | Ht 62.0 in | Wt 125.0 lb

## 2022-11-04 DIAGNOSIS — G8929 Other chronic pain: Secondary | ICD-10-CM | POA: Diagnosis not present

## 2022-11-04 DIAGNOSIS — M25561 Pain in right knee: Secondary | ICD-10-CM

## 2022-11-04 DIAGNOSIS — M1711 Unilateral primary osteoarthritis, right knee: Secondary | ICD-10-CM | POA: Diagnosis not present

## 2022-11-04 MED ORDER — METHYLPREDNISOLONE ACETATE 40 MG/ML IJ SUSP
40.0000 mg | Freq: Once | INTRAMUSCULAR | Status: AC
  Administered 2022-11-04: 40 mg via INTRA_ARTICULAR

## 2022-11-04 NOTE — Progress Notes (Addendum)
   Subjective:    Patient ID: Michelle [name], female    DOB: [birth year], 72 y.o.   MRN: [id number]  HPI chief complaint: Right knee pain  Michelle [name] presents today complaining of right knee pain that has been present now for several months.  Pain is diffuse throughout the knee and constant throughout the day.  It will also interfere with her sleep.  She has a history of rheumatoid arthritis as well as osteoarthritis.  She is followed by a rheumatologist in [facility]   She denies any knee swelling.  She does have a history of a remote arthroscopy many years ago on the same knee.  She does describe instability and weakness which has caused her to fall.  No recent imaging of the right knee.  She states that topical medications such as diclofenac cream is somewhat helpful.  Interim medical history reviewed Medications reviewed Allergies reviewed    Review of Systems As above    Objective:   Physical Exam  Well-developed, well-nourished.  No acute distress  Right knee: There is a 2 to 3 degree extension lag.  Flexion is to 120 degrees.  No effusion.  There is bony hypertrophy of the knee consistent with DJD.  She is tender to palpation along the medial joint line but a negative McMurray's.  Slight tenderness along the lateral joint line as well.  Knee is grossly stable to ligamentous exam.  Neurovascularly intact distally.      Assessment & Plan:   Right knee pain secondary to DJD History of rheumatoid arthritis  Her rheumatologist believes that her right knee pain is secondary to DJD and not her rheumatoid arthritis.  I agree.  I recommended that we try a cortisone injection today to see if we can help calm things down.  Injection is performed without difficulty utilizing an anterior lateral approach.  She tolerates this without difficulty.  I will also provide her with a double upright brace to wear with walking.  I would also like for her to start daily isometric quad sets to help  prevent further quad atrophy.  If the injection is beneficial and effective for at least the next 3 to 4 months, we could consider repeating the injection down the road.  If she notices no benefit from today's injection, then we will order standing x-rays of her right knee to evaluate the degree of osteoarthritis present.  She will follow-up for ongoing or recalcitrant issues.  Consent obtained and verified. Time-out conducted. Noted no overlying erythema, induration, or other signs of local infection. Skin prepped in a sterile fashion. Topical analgesic spray: Ethyl chloride. Joint: Right knee Needle: 25-gauge 1.5 inch Completed without difficulty. Meds: 3 cc 1% Xylocaine, 1 cc (40 mg) Depo-Medrol  This note was dictated using Dragon naturally speaking software and may contain errors in syntax, spelling, or content which have not been identified prior to signing this note.

## 2022-11-06 ENCOUNTER — Encounter: Payer: Self-pay | Admitting: Podiatry

## 2022-11-06 ENCOUNTER — Ambulatory Visit (INDEPENDENT_AMBULATORY_CARE_PROVIDER_SITE_OTHER): Payer: 59 | Admitting: Podiatry

## 2022-11-06 DIAGNOSIS — M79674 Pain in right toe(s): Secondary | ICD-10-CM

## 2022-11-06 DIAGNOSIS — B351 Tinea unguium: Secondary | ICD-10-CM

## 2022-11-06 DIAGNOSIS — E1151 Type 2 diabetes mellitus with diabetic peripheral angiopathy without gangrene: Secondary | ICD-10-CM

## 2022-11-06 DIAGNOSIS — M79675 Pain in left toe(s): Secondary | ICD-10-CM | POA: Diagnosis not present

## 2022-11-06 NOTE — Progress Notes (Signed)
  Subjective:  Patient ID: Michelle [name], female    DOB: 1951/06/08,  MRN: [id number]  Michelle [name] presents to clinic today for  Chief Complaint  Patient presents with   Nail Problem    Nail trim A1C-8.0    at risk footcare. Patient has h/o diabetes, neuropathy and PAD and is seen for  and painful thick toenails that are difficult to trim. Pain interferes with ambulation. Aggravating factors include wearing enclosed shoe gear. Pain is relieved with periodic professional debridement.  New problem(s): None.   PCP is Littie Deeds, MD and last visit was 08/26/2022.  Allergies  Allergen Reactions   Zofran [Ondansetron Hcl] Other (See Comments)    Prolong QT   Ace Inhibitors Cough    Occurred with lisinopril   Levofloxacin Other (See Comments)    Felt things on her legs that were not there,made her stomach hurt   Lisinopril Cough   Sulfonamide Derivatives Hives and Itching   Sulfa Antibiotics     Reaction:  Other reaction(s): ITCHING    Review of Systems: Negative except as noted in the HPI.  Objective:   Constitutional Michelle [name] is a pleasant 72 y.o. Caucasian female, thin build in NAD. AAO x 3.   Vascular CFT <3 seconds b/l LE. Palpable DP pulse(s) right foot Palpable PT pulse(s) right foot Diminished DP pulse(s) left foot. Diminished PT pulse(s) left foot. Pedal hair absent. No pain with calf compression b/l. Lower extremity skin temperature gradient within normal limits. No ischemia or gangrene noted b/l LE. No cyanosis or clubbing noted b/l LE.  Neurologic Normal speech. Oriented to person, place, and time. Pt has subjective symptoms of neuropathy. Protective sensation intact 5/5 intact bilaterally with 10g monofilament b/l. Vibratory sensation intact b/l.  Dermatologic Pedal skin is warm and supple b/l LE. No open wounds b/l LE. No interdigital macerations noted b/l LE. Toenails 1-5 b/l elongated, discolored, dystrophic, thickened, crumbly with subungual debris and  tenderness to dorsal palpation. No hyperkeratotic nor porokeratotic lesions present on today's visit.  Orthopedic: Normal muscle strength 5/5 to all lower extremity muscle groups bilaterally. HAV with bunion deformity noted b/l LE. Hammertoe(s) noted to the L 3rd toe and R 3rd toe.Marland Kitchen No pain, crepitus or joint limitation noted with ROM b/l LE.  Patient ambulates independently without assistive aids.   Radiographs: None  Last A1c:     Latest Ref Rng & Units 08/26/2022    8:28 AM 05/28/2022   10:02 AM 02/07/2022    9:37 AM 11/21/2021    2:23 PM  Hemoglobin A1C  Hemoglobin-A1c 0.0 - 7.0 % 8.0  6.1  6.6  7.1      Assessment:   1. Pain due to onychomycosis of toenails of both feet   2. Diabetes mellitus with peripheral circulatory disorder [facility])    Plan:  Patient was evaluated and treated and all questions answered. Consent given for treatment as described below: -Consent given for treatment as described below: -Examined patient. -Continue foot and shoe inspections daily. Monitor blood glucose per PCP/Endocrinologist's recommendations. -Patient to continue soft, supportive shoe gear daily. -Toenails 1-5 b/l were debrided in length and girth with sterile nail nippers and dremel without iatrogenic bleeding.  -Patient/POA to call should there be question/concern in the interim.  Return in about 4 months (around 03/09/2023).  Freddie Breech, DPM

## 2022-11-10 ENCOUNTER — Other Ambulatory Visit: Payer: Self-pay | Admitting: Cardiology

## 2022-11-10 ENCOUNTER — Other Ambulatory Visit: Payer: Self-pay | Admitting: Dermatology

## 2022-11-10 DIAGNOSIS — L409 Psoriasis, unspecified: Secondary | ICD-10-CM

## 2022-11-10 DIAGNOSIS — I5022 Chronic systolic (congestive) heart failure: Secondary | ICD-10-CM

## 2022-11-21 DIAGNOSIS — M5416 Radiculopathy, lumbar region: Secondary | ICD-10-CM | POA: Diagnosis not present

## 2022-11-22 ENCOUNTER — Other Ambulatory Visit: Payer: Self-pay

## 2022-11-22 ENCOUNTER — Encounter: Payer: Self-pay | Admitting: Cardiology

## 2022-11-22 ENCOUNTER — Other Ambulatory Visit: Payer: Self-pay | Admitting: Family Medicine

## 2022-11-22 ENCOUNTER — Other Ambulatory Visit: Payer: Self-pay | Admitting: Hematology and Oncology

## 2022-11-22 ENCOUNTER — Ambulatory Visit: Payer: 59 | Admitting: Cardiology

## 2022-11-22 VITALS — BP 111/70 | HR 68 | Ht 62.0 in | Wt 128.8 lb

## 2022-11-22 DIAGNOSIS — Z01810 Encounter for preprocedural cardiovascular examination: Secondary | ICD-10-CM | POA: Diagnosis not present

## 2022-11-22 DIAGNOSIS — E119 Type 2 diabetes mellitus without complications: Secondary | ICD-10-CM

## 2022-11-22 DIAGNOSIS — I5022 Chronic systolic (congestive) heart failure: Secondary | ICD-10-CM

## 2022-11-22 DIAGNOSIS — I428 Other cardiomyopathies: Secondary | ICD-10-CM

## 2022-11-22 DIAGNOSIS — I700 Atherosclerosis of aorta: Secondary | ICD-10-CM | POA: Diagnosis not present

## 2022-11-22 DIAGNOSIS — D500 Iron deficiency anemia secondary to blood loss (chronic): Secondary | ICD-10-CM

## 2022-11-22 DIAGNOSIS — I2510 Atherosclerotic heart disease of native coronary artery without angina pectoris: Secondary | ICD-10-CM

## 2022-11-22 DIAGNOSIS — I10 Essential (primary) hypertension: Secondary | ICD-10-CM

## 2022-11-22 DIAGNOSIS — Z87891 Personal history of nicotine dependence: Secondary | ICD-10-CM | POA: Diagnosis not present

## 2022-11-22 DIAGNOSIS — E1169 Type 2 diabetes mellitus with other specified complication: Secondary | ICD-10-CM

## 2022-11-22 DIAGNOSIS — E785 Hyperlipidemia, unspecified: Secondary | ICD-10-CM

## 2022-11-22 MED ORDER — ENTRESTO 24-26 MG PO TABS
1.0000 | ORAL_TABLET | Freq: Two times a day (BID) | ORAL | 1 refills | Status: DC
Start: 2022-11-22 — End: 2023-04-22

## 2022-11-22 MED ORDER — METOPROLOL SUCCINATE ER 25 MG PO TB24
25.0000 mg | ORAL_TABLET | Freq: Every day | ORAL | 1 refills | Status: DC
Start: 2022-11-22 — End: 2023-05-23

## 2022-11-22 NOTE — Progress Notes (Signed)
ID:  Michelle [name], DOB [birth year], MRN [id number]  PCP:  Littie Deeds, MD  Cardiologist:  Tessa Lerner, DO, [facility] (established care 07/22/2022) Former Cardiology Providers: Dr. Jacinto Halim  Date: 11/22/22 Last Office Visit: 08/16/2022  Chief Complaint  Patient presents with   Chronic HFrEF (heart failure with reduced ejection fraction   Follow-up    HPI  Michelle [name] is a 72 y.o. Caucasian female whose past medical history and cardiovascular risk factors include: Nonischemic cardiomyopathy, chronic HFrEF, coronary artery calcification (CT scan care everywhere), aortic atherosclerosis, former smoker, non-insulin-dependent diabetes mellitus type 2, hyperlipidemia, hypertension.   Patient is being followed by the practice for chronic HFrEF.  She presents today for 89-month follow-up visit.  In the past she underwent left and right heart catheterization which noted no significant obstructive disease and compensated nonischemic cardiomyopathy.  Her GDMT has been uptitrated to the maximum tolerated doses.  She is not taking SGLT2 inhibitors given the history of multiple yeast infections in the past.  Over the last 3 months she has not had any anginal chest pain or heart failure symptoms.  She has had infrequent precordial discomfort and therefore the EKG was performed which is nonischemic.  Otherwise she is doing well from a cardiovascular standpoint.  She plans to have extractions of her teeth with moderate sedation coming up.  FUNCTIONAL STATUS: No structured exercise program or daily routine.   ALLERGIES: Allergies  Allergen Reactions   Zofran [Ondansetron Hcl] Other (See Comments)    Prolong QT   Ace Inhibitors Cough    Occurred with lisinopril   Levofloxacin Other (See Comments)    Felt things on her legs that were not there,made her stomach hurt   Lisinopril Cough   Sulfonamide Derivatives Hives and Itching   Sulfa Antibiotics     Reaction:  Other reaction(s): ITCHING     MEDICATION LIST PRIOR TO VISIT: Current Meds  Medication Sig   Accu-Chek Softclix Lancets lancets Please use to check blood sugar up to two times per day. E11.42   acetaminophen (TYLENOL) 650 MG CR tablet Take 650 mg by mouth every 8 (eight) hours as needed for pain.   aspirin EC 81 MG tablet Take 81 mg by mouth daily. Swallow whole.   betamethasone dipropionate 0.05 % lotion Apply topically as directed. Apply to the scalp daily up to 5 days for scalp (Patient taking differently: Apply 1 application  topically daily as needed (scalp irritation). Apply to the scalp daily up to 5 days for scalp)   Blood Glucose Monitoring Suppl (ONE TOUCH ULTRA 2) w/Device KIT USE AS DIRECTED   BREO ELLIPTA 100-25 MCG/ACT AEPB INHALE 1 PUFF BY MOUTH EVERY DAY   clobetasol (TEMOVATE) 0.05 % external solution Apply 1 application  topically daily. To scalp up to 5 times per week   cromolyn (OPTICROM) 4 % ophthalmic solution Place 1 drop into both eyes 3 (three) times daily as needed (itchy eyes).   diclofenac Sodium (VOLTAREN) 1 % GEL Apply 2 g topically 4 (four) times daily.   Dulaglutide 0.75 MG/0.5ML SOPN Inject 0.75 mg into the skin once a week.   fluticasone (FLONASE) 50 MCG/ACT nasal spray Place 2 sprays into both nostrils daily as needed for allergies.   gabapentin (NEURONTIN) 300 MG capsule TAKE 1 CAPSULE BY MOUTH EVERYDAY AT BEDTIME   glucose blood test strip Use as instructed a few times a week as needed   glucose blood test strip Use to check blood glucose 2 times a day  as needed   hydrOXYzine (ATARAX) 25 MG tablet TAKE 1-2 TABLETS BY MOUTH AT BEDTIME AS NEEDED FOR ITCH. MAY MAKE DROWSY. DO NOT DRIVE AFTER TAKING   leflunomide (ARAVA) 10 MG tablet Take 10 mg by mouth daily.   metFORMIN (GLUCOPHAGE-XR) 500 MG 24 hr tablet TAKE 2 TABLETS BY MOUTH TWICE A DAY   mirabegron ER (MYRBETRIQ) 50 MG TB24 tablet Take 50 mg by mouth in the morning.   mometasone (ELOCON) 0.1 % cream APPLY 1 APPLICATION TOPICALLY  DAILY AS NEEDED (RASH).   montelukast (SINGULAIR) 10 MG tablet TAKE 1 TABLET BY MOUTH EVERY DAY (Patient taking differently: Take 10 mg by mouth at bedtime.)   mupirocin ointment (BACTROBAN) 2 % APPLY TOPICALLY ONCE DAILY TO OPEN SORES ON ARMS, LEGS AND TOES UNTIL HEALED. THEN APPLY TOPICALLY TO AFFECTED AREAS AS NEEDED FOR FLARES   nitroGLYCERIN (NITROSTAT) 0.4 MG SL tablet Place 1 tablet (0.4 mg total) under the tongue every 5 (five) minutes as needed.   omeprazole (PRILOSEC) 40 MG capsule Take 1 capsule (40 mg total) by mouth in the morning.   oxyCODONE-acetaminophen (PERCOCET) 10-325 MG tablet Take 1 tablet by mouth every 6 (six) hours as needed for pain.   rosuvastatin (CRESTOR) 10 MG tablet Take 1 tablet (10 mg total) by mouth daily. (Patient taking differently: Take 10 mg by mouth every evening.)   sertraline (ZOLOFT) 50 MG tablet TAKE 1 TABLET BY MOUTH EVERY DAY   VENTOLIN HFA 108 (90 Base) MCG/ACT inhaler INHALE 2 PUFFS BY MOUTH EVERY 4 HOURS AS NEEDED FOR WHEEZING OR SHORTNESS OF BREATH   [DISCONTINUED] ENTRESTO 24-26 MG Take 1 tablet by mouth twice daily   [DISCONTINUED] metoprolol succinate (TOPROL-XL) 25 MG 24 hr tablet Take 1 tablet (25 mg total) by mouth at bedtime. (Patient taking differently: Take 25 mg by mouth daily after breakfast.)     PAST MEDICAL HISTORY: Past Medical History:  Diagnosis Date   Actinic keratosis    Allergic rhinitis 08/28/2006   Anemia    low iron   Anginal pain (HCC)    Asthma, chronic 03/14/2010   Does seem to use her albuterol excessively. Consider change to more manageable medicines and treat more like COPD in the future.   Carpal tunnel syndrome, left 05/29/2009   Last Assessment & Plan:  Advised to wear wrist brace at night when she is sleeping.  If this does not help will consider having her go to sports medicine.   Chronic frontal sinusitis 08/28/2016   Chronic left maxillary sinusitis 08/28/2016   Diverticulosis    Essential hypertension  10/30/2012   Gastroparesis 11/19/2011   DM and chronic narcotics contribute.  Causes dysphagia like symptoms    Generalized anxiety disorder 08/28/2006   GERD (gastroesophageal reflux disease)    Hearing loss 07/19/2015   Hearing aid in right ear   History of cataracts, bilateral    Hyperlipidemia 08/28/2006   Lumbar radiculopathy 04/07/2018   Lung nodules    Osteoporosis 08/08/2011   Pain in joint, pelvic region and thigh 11/18/2012   Radial styloid tenosynovitis 08/15/2013   Restless leg syndrome 05/14/2012   Seropositive rheumatoid arthritis 01/08/2010   Patient is being followed by rheumatologist at [facility] patient is on Plaquenil and as well as prednisone.   Changed to methotrexate and prednisone in May of 2013  Patient does have a pain contract here with [facility] receives oxycodone 10/325 mg tabs every 6 hours. This has been titrated down from significant  doses of methadone previously. Could attempt to tit   Shoulder joint pain 03/25/2011   The patient is compensating due to her not being able to use her left arm right now rotator cuff impingement if he continues to worsen would not be surprised if she does get a tear on this side as well   Squamous cell carcinoma in situ of skin 11/25/2014   Referred to dermatology for excision    Squamous cell carcinoma of skin 12/26/2014   Right distal pretibial. SCC-KA pattern.   Squamous cell carcinoma of skin 03/27/2015   Right inf. lat. knee. SCC-KA pattern.    Squamous cell carcinoma of skin 10/07/2016   Left lateral calf superior. WD SCC.   Squamous cell carcinoma of skin 10/07/2016   Left lateral calf inferior. WD SCC.   Squamous cell carcinoma of skin 04/28/2018   Left mid med. pretibial. WD SCC.   Squamous cell carcinoma of skin 08/12/2019   Left mid med. pretibial sup. KA type. EDC   Squamous cell carcinoma of skin 08/12/2019   Left mid med. pretibial inferior. WD. EDC.   Tear of medial  meniscus of knee, left 04/27/2009   Type II diabetes mellitus 08/28/2006   Overview:  Patient has her ups and downs, go in line with flares of her RA and prednisone uses.  Patient does have hx of hypoglycemic events so would not try for perfect control but should have A1c goal around 7.0 Lab Results  Component Value Date   HGBA1C 6.9 12/27/2011   Last Assessment & Plan:  At goal. Will continue current metformin    PAST SURGICAL HISTORY: Past Surgical History:  Procedure Laterality Date   ABDOMINAL HYSTERECTOMY     APPENDECTOMY     BACK SURGERY     BIOPSY  05/10/2021   Procedure: BIOPSY;  Surgeon: Rachael Fee, MD;  Location: [facility] ENDOSCOPY;  Service: Endoscopy;;  EGD and COLON   CARPAL TUNNEL RELEASE Right    CATARACT EXTRACTION Left    with lid lift   CHOLECYSTECTOMY     COLONOSCOPY     COLONOSCOPY WITH PROPOFOL N/A 05/10/2021   Procedure: COLONOSCOPY WITH PROPOFOL;  Surgeon: Rachael Fee, MD;  Location: [facility];  Service: Endoscopy;  Laterality: N/A;   ESOPHAGOGASTRODUODENOSCOPY (EGD) WITH PROPOFOL N/A 05/10/2021   Procedure: ESOPHAGOGASTRODUODENOSCOPY (EGD) WITH PROPOFOL;  Surgeon: Rachael Fee, MD;  Location: [facility];  Service: Endoscopy;  Laterality: N/A;   HERNIA REPAIR     KNEE SURGERY Right    x2- cartilage annd scar tissue   LEFT AND RIGHT HEART CATHETERIZATION WITH CORONARY ANGIOGRAM N/A 08/17/2013   Procedure: LEFT AND RIGHT HEART CATHETERIZATION WITH CORONARY ANGIOGRAM;  Surgeon: Pamella Pert, MD;  Location[facility] CATH LAB;  Service: Cardiovascular;  Laterality: N/A;   lumbar back surgery  04/04/2018   done by neurosurgeron   right foot bone spurs removed     RIGHT/LEFT HEART CATH AND CORONARY ANGIOGRAPHY N/A 07/30/2022   Procedure: RIGHT/LEFT HEART CATH AND CORONARY ANGIOGRAPHY;  Surgeon: Elder Negus, MD;  Location: [facility] INVASIVE CV LAB;  Service: Cardiovascular;  Laterality: N/A;   ROTATOR CUFF REPAIR Right    SHOULDER SURGERY Left    x2-  rotatar cuff tear   UPPER GASTROINTESTINAL ENDOSCOPY      FAMILY HISTORY: The patient family history includes Bell's palsy in her maternal grandmother; COPD in her sister; Cancer in her sister; Colon cancer in her daughter and maternal aunt; Diabetes in her daughter; Healthy in  her daughter; Hypertension in her daughter; Lung cancer (age of onset: 46) in her brother; Rheum arthritis (age of onset: 38) in her sister; Stroke (age of onset: 74) in her father; Tremor in her mother.  SOCIAL HISTORY:  The patient  reports that she quit smoking about 28 years ago. Her smoking use included cigarettes. She has a 7.50 pack-year smoking history. She has been exposed to tobacco smoke. She has never used smokeless tobacco. She reports that she does not drink alcohol and does not use drugs.  REVIEW OF SYSTEMS: Review of Systems  Constitutional: Positive for malaise/fatigue.  Cardiovascular:  Negative for chest pain, claudication, dyspnea on exertion, irregular heartbeat, leg swelling, near-syncope, orthopnea, palpitations, paroxysmal nocturnal dyspnea and syncope.  Respiratory:  Negative for shortness of breath.   Hematologic/Lymphatic: Negative for bleeding problem.  Musculoskeletal:  Positive for back pain and joint pain. Negative for muscle cramps and myalgias.  Neurological:  Negative for dizziness and light-headedness.    PHYSICAL EXAM:    11/22/2022    9:00 AM 11/04/2022   10:56 AM 10/10/2022    9:39 AM  Vitals with BMI  Height 5\' 2"  5\' 2"  5\' 2"   Weight 128 lbs 13 oz 125 lbs 126 lbs 6 oz  BMI 23.55 22.86 23.11  Systolic 111 114 161  Diastolic 70 78 65  Pulse 68  82    Physical Exam  Constitutional: No distress.  Age appropriate, hemodynamically stable.   HENT:  Poor oral hygiene   Neck: No JVD present.  Cardiovascular: Normal rate, regular rhythm, S1 normal, S2 normal, intact distal pulses and normal pulses. Exam reveals no gallop, no S3 and no S4.  No murmur heard. Pulmonary/Chest:  Effort normal and breath sounds normal. No stridor. She has no wheezes. She has no rales.  Abdominal: Soft. Bowel sounds are normal. She exhibits no distension. There is no abdominal tenderness.  Musculoskeletal:        General: No edema.     Cervical back: Neck supple.  Neurological: She is alert and oriented to person, place, and time. She has intact cranial nerves (2-12).  Skin: Skin is warm and moist.   CARDIAC DATABASE: EKG: 07/22/2022: Sinus rhythm, 97 bpm, normal axis, without underlying injury pattern. Nov 22, 2022: Normal sinus rhythm, 88 bpm, without underlying ischemia or injury pattern.  Echocardiogram: 07/17/2022: LVEF 35-40%, global hypokinesis, grade 1 diastolic impairment, mild MR, estimated RAP 3 mmHg.   Stress Testing: No results found for this or any previous visit from the past 1095 days.   Heart Catheterization: 07/30/2022: LM: Nonexistent. Dual ostial present to LAD and Lcx LAD: Mild medial calcification. No significant disease Lcx: No significant disease RCA: No significant disease   LVEDP 12 mmHg RA: 2 mmHg RV: 19/1 mmHg PA: 20/8 mmHg, mPAP 13 mmHg PCW: 3 mmHg   CO: 4.9 L/min CI: 3.1 L/min/m2   No oxygen step up on shunt run   Well compensated nonischemic cardiomyopathy   ABI 12/04/2021 Right: Resting right ankle-brachial index is within normal range. No evidence of significant right lower extremity arterial disease. The right toe-brachial index is normal.   Left: Resting left ankle-brachial index is within normal range. No evidence of significant left lower extremity arterial disease. The left toe-brachial index is normal.   LABORATORY DATA:    Latest Ref Rng & Units 08/29/2022   10:25 AM 07/30/2022    8:32 AM 07/30/2022    8:29 AM  CBC  WBC 4.0 - 10.5 K/uL 9.4  Hemoglobin 12.0 - 15.0 g/dL 19.1  47.8  29.5    62.1   Hematocrit 36.0 - 46.0 % 34.9  31.0  32.0    31.0   Platelets 150 - 400 K/uL 281          Latest Ref Rng & Units  08/29/2022   10:25 AM 08/16/2022   11:44 AM 07/30/2022    8:32 AM  CMP  Glucose 70 - 99 mg/dL 308  657    BUN 8 - 23 mg/dL 13  12    Creatinine 8.46 - 1.00 mg/dL 9.62  9.52    Sodium 841 - 145 mmol/L 137  138  140   Potassium 3.5 - 5.1 mmol/L 4.4  4.5  3.7   Chloride 98 - 111 mmol/L 102  102    CO2 22 - 32 mmol/L 28  20    Calcium 8.9 - 10.3 mg/dL 9.3  8.8    Total Protein 6.5 - 8.1 g/dL 6.7     Total Bilirubin 0.3 - 1.2 mg/dL 0.3     Alkaline Phos 38 - 126 U/L 88     AST 15 - 41 U/L 19     ALT 0 - 44 U/L 14       Lipid Panel  Lab Results  Component Value Date   CHOL 117 08/26/2022   HDL 41 08/26/2022   LDLCALC 54 08/26/2022   LDLDIRECT 43 06/01/2020   TRIG 122 08/26/2022   CHOLHDL 2.9 08/26/2022     No components found for: "NTPROBNP" Recent Labs    07/22/22 1054 08/16/22 1144  PROBNP 80 44   No results for input(s): "TSH" in the last 8760 hours.  BMP Recent Labs    02/07/22 0937 07/22/22 1054 07/30/22 0802 07/30/22 0832 08/16/22 1144 08/29/22 1025  NA 137 139   < > 140 138 137  K 4.1 4.2   < > 3.7 4.5 4.4  CL 104 102  --   --  102 102  CO2 26 20  --   --  20 28  GLUCOSE 122* 132*  --   --  172* 109*  BUN 10 15  --   --  12 13  CREATININE 0.42* 0.46*  --   --  0.46* 0.42*  CALCIUM 9.5 9.2  --   --  8.8 9.3  GFRNONAA >60  --   --   --   --  >60   < > = values in this interval not displayed.    HEMOGLOBIN A1C Lab Results  Component Value Date   HGBA1C 8.0 (A) 08/26/2022   MPG 142.72 02/07/2022    IMPRESSION:    ICD-10-CM   1. Chronic HFrEF (heart failure with reduced ejection fraction) (HCC)  I50.22 EKG 12-Lead    PCV ECHOCARDIOGRAM COMPLETE    metoprolol succinate (TOPROL-XL) 25 MG 24 hr tablet    sacubitril-valsartan (ENTRESTO) 24-26 MG    2. Nonischemic cardiomyopathy (HCC)  I42.8 PCV ECHOCARDIOGRAM COMPLETE    3. Preprocedural cardiovascular examination  Z01.810     4. Coronary artery calcification seen on computed tomography  I25.10      5. Aortic atherosclerosis (HCC)  I70.0     6. Non-insulin dependent type 2 diabetes mellitus (HCC)  E11.9     7. Type 2 diabetes mellitus with hyperlipidemia (HCC)  E11.69    E78.5     8. Benign hypertension  I10     9. Former smoker  [phone]  RECOMMENDATIONS: Michelle [name] is a 72 y.o. Caucasian female whose past medical history and cardiac risk factors include: Nonischemic cardiomyopathy, chronic HFrEF coronary artery calcification, aortic atherosclerosis, former smoker, non-insulin-dependent diabetes mellitus type 2, hyperlipidemia, hypertension.   Chronic HFrEF (heart failure with reduced ejection fraction) (HCC) Nonischemic cardiomyopathy Stage B, NYHA class II Newly discovered in Jan 2024 Denies anginal discomfort or heart failure symptoms. Left and right heart catheterization results reviewed as part of medical decision making at today's visit Refilled Entresto and Toprol-XL for 6 months. Will hold off uptitration of GDMT due to soft blood pressures. SGLT2 inhibitors held secondary to prior yeast infections. Repeat echocardiogram to reevaluate LVEF. Will likely order a cardiac MRI at the next office visit. Patient states that her Remicade for rheumatoid arthritis has been discontinued by her provider.  Preprocedural cardiovascular examination. Patient plans to have extraction of all remaining teeth under moderate sedation in the upcoming weeks with Dr. Shella Spearing Patient is optimized from a cardiovascular standpoint. No active chest pain. Has undergone appropriate cardiovascular workup. Given her LVEF, advanced age, and comorbidities she is still considered to be moderate risk for upcoming procedure.  However the procedure is not prohibitive.  Patient understands this risk and would like to proceed forward.  I would recommend minimizing sedation if possible to the minimal dose needed (which she is agreeable).  Patient is asked to hold aspirin 7 days prior to  procedure.  Coronary artery calcification seen on computed tomography Aortic atherosclerosis (HCC) Continue aspirin and statin therapy  Non-insulin dependent type 2 diabetes mellitus (HCC) Reemphasized importance of glycemic control. Currently on statin therapy, ARNI Unable to do SGLT2 inhibitors due to a prior history of yeast infections.  Type 2 diabetes mellitus with hyperlipidemia (HCC) Recommend a goal LDL of <55 mg/dL. Lipids currently being managed by PCP  Benign hypertension Office blood pressures are well-controlled. No changes warranted at this time  FINAL MEDICATION LIST END OF ENCOUNTER: Meds ordered this encounter  Medications   metoprolol succinate (TOPROL-XL) 25 MG 24 hr tablet    Sig: Take 1 tablet (25 mg total) by mouth at bedtime.    Dispense:  90 tablet    Refill:  1   sacubitril-valsartan (ENTRESTO) 24-26 MG    Sig: Take 1 tablet by mouth 2 (two) times daily.    Dispense:  180 tablet    Refill:  1    Medications Discontinued During This Encounter  Medication Reason   oxyCODONE-acetaminophen (PERCOCET) 10-325 MG tablet    oxyCODONE-acetaminophen (PERCOCET) 10-325 MG tablet    oxyCODONE-acetaminophen (PERCOCET) 10-325 MG tablet    metoprolol succinate (TOPROL-XL) 25 MG 24 hr tablet Reorder   ENTRESTO 24-26 MG Reorder     Current Outpatient Medications:    Accu-Chek Softclix Lancets lancets, Please use to check blood sugar up to two times per day. E11.42, Disp: 200 each, Rfl: 4   acetaminophen (TYLENOL) 650 MG CR tablet, Take 650 mg by mouth every 8 (eight) hours as needed for pain., Disp: , Rfl:    aspirin EC 81 MG tablet, Take 81 mg by mouth daily. Swallow whole., Disp: , Rfl:    betamethasone dipropionate 0.05 % lotion, Apply topically as directed. Apply to the scalp daily up to 5 days for scalp (Patient taking differently: Apply 1 application  topically daily as needed (scalp irritation). Apply to the scalp daily up to 5 days for scalp), Disp: 60 mL,  Rfl: 2   Blood Glucose Monitoring Suppl (ONE TOUCH ULTRA 2) w/Device  KIT, USE AS DIRECTED, Disp: 1 kit, Rfl: 0   BREO ELLIPTA 100-25 MCG/ACT AEPB, INHALE 1 PUFF BY MOUTH EVERY DAY, Disp: 180 each, Rfl: 1   clobetasol (TEMOVATE) 0.05 % external solution, Apply 1 application  topically daily. To scalp up to 5 times per week, Disp: 50 mL, Rfl: 3   cromolyn (OPTICROM) 4 % ophthalmic solution, Place 1 drop into both eyes 3 (three) times daily as needed (itchy eyes)., Disp: , Rfl:    diclofenac Sodium (VOLTAREN) 1 % GEL, Apply 2 g topically 4 (four) times daily., Disp: , Rfl:    Dulaglutide 0.75 MG/0.5ML SOPN, Inject 0.75 mg into the skin once a week., Disp: 6 mL, Rfl: 0   fluticasone (FLONASE) 50 MCG/ACT nasal spray, Place 2 sprays into both nostrils daily as needed for allergies., Disp: 144 mL, Rfl: 1   gabapentin (NEURONTIN) 300 MG capsule, TAKE 1 CAPSULE BY MOUTH EVERYDAY AT BEDTIME, Disp: 90 capsule, Rfl: 3   glucose blood test strip, Use as instructed a few times a week as needed, Disp: 100 each, Rfl: 12   glucose blood test strip, Use to check blood glucose 2 times a day as needed, Disp: 100 each, Rfl: 12   hydrOXYzine (ATARAX) 25 MG tablet, TAKE 1-2 TABLETS BY MOUTH AT BEDTIME AS NEEDED FOR ITCH. MAY MAKE DROWSY. DO NOT DRIVE AFTER TAKING, Disp: 60 tablet, Rfl: 0   leflunomide (ARAVA) 10 MG tablet, Take 10 mg by mouth daily., Disp: , Rfl:    metFORMIN (GLUCOPHAGE-XR) 500 MG 24 hr tablet, TAKE 2 TABLETS BY MOUTH TWICE A DAY, Disp: 360 tablet, Rfl: 1   mirabegron ER (MYRBETRIQ) 50 MG TB24 tablet, Take 50 mg by mouth in the morning., Disp: , Rfl:    mometasone (ELOCON) 0.1 % cream, APPLY 1 APPLICATION TOPICALLY DAILY AS NEEDED (RASH)., Disp: 45 g, Rfl: 11   montelukast (SINGULAIR) 10 MG tablet, TAKE 1 TABLET BY MOUTH EVERY DAY (Patient taking differently: Take 10 mg by mouth at bedtime.), Disp: 30 tablet, Rfl: 0   mupirocin ointment (BACTROBAN) 2 %, APPLY TOPICALLY ONCE DAILY TO OPEN SORES ON ARMS,  LEGS AND TOES UNTIL HEALED. THEN APPLY TOPICALLY TO AFFECTED AREAS AS NEEDED FOR FLARES, Disp: 22 g, Rfl: 0   nitroGLYCERIN (NITROSTAT) 0.4 MG SL tablet, Place 1 tablet (0.4 mg total) under the tongue every 5 (five) minutes as needed., Disp: 25 tablet, Rfl: 1   omeprazole (PRILOSEC) 40 MG capsule, Take 1 capsule (40 mg total) by mouth in the morning., Disp: 90 capsule, Rfl: 1   oxyCODONE-acetaminophen (PERCOCET) 10-325 MG tablet, Take 1 tablet by mouth every 6 (six) hours as needed for pain., Disp: 120 tablet, Rfl: 0   rosuvastatin (CRESTOR) 10 MG tablet, Take 1 tablet (10 mg total) by mouth daily. (Patient taking differently: Take 10 mg by mouth every evening.), Disp: 90 tablet, Rfl: 3   sertraline (ZOLOFT) 50 MG tablet, TAKE 1 TABLET BY MOUTH EVERY DAY, Disp: 90 tablet, Rfl: 3   VENTOLIN HFA 108 (90 Base) MCG/ACT inhaler, INHALE 2 PUFFS BY MOUTH EVERY 4 HOURS AS NEEDED FOR WHEEZING OR SHORTNESS OF BREATH, Disp: 108 each, Rfl: 11   metoprolol succinate (TOPROL-XL) 25 MG 24 hr tablet, Take 1 tablet (25 mg total) by mouth at bedtime., Disp: 90 tablet, Rfl: 1   sacubitril-valsartan (ENTRESTO) 24-26 MG, Take 1 tablet by mouth 2 (two) times daily., Disp: 180 tablet, Rfl: 1  Orders Placed This Encounter  Procedures   EKG 12-Lead   PCV  ECHOCARDIOGRAM COMPLETE    There are no Patient Instructions on file for this visit.   --Continue cardiac medications as reconciled in final medication list. --Return in about 6 months (around 05/25/2023) for Follow up, heart failure management.. or sooner if needed. --Continue follow-up with your primary care physician regarding the management of your other chronic comorbid conditions.  Patient's questions and concerns were addressed to her satisfaction. She voices understanding of the instructions provided during this encounter.   This note was created using a voice recognition software as a result there may be grammatical errors inadvertently enclosed that do not  reflect the nature of this encounter. Every attempt is made to correct such errors.  Tessa Lerner, [place], [facility]  Pager:  [phone] Office: [phone]

## 2022-11-26 ENCOUNTER — Ambulatory Visit (INDEPENDENT_AMBULATORY_CARE_PROVIDER_SITE_OTHER): Payer: 59 | Admitting: Family Medicine

## 2022-11-26 ENCOUNTER — Encounter: Payer: Self-pay | Admitting: Family Medicine

## 2022-11-26 ENCOUNTER — Inpatient Hospital Stay: Payer: 59 | Attending: Hematology and Oncology

## 2022-11-26 VITALS — BP 124/58 | HR 88 | Ht 62.0 in | Wt 128.0 lb

## 2022-11-26 DIAGNOSIS — M059 Rheumatoid arthritis with rheumatoid factor, unspecified: Secondary | ICD-10-CM | POA: Diagnosis not present

## 2022-11-26 DIAGNOSIS — E1142 Type 2 diabetes mellitus with diabetic polyneuropathy: Secondary | ICD-10-CM | POA: Diagnosis not present

## 2022-11-26 DIAGNOSIS — R3915 Urgency of urination: Secondary | ICD-10-CM | POA: Diagnosis not present

## 2022-11-26 DIAGNOSIS — D500 Iron deficiency anemia secondary to blood loss (chronic): Secondary | ICD-10-CM

## 2022-11-26 DIAGNOSIS — D509 Iron deficiency anemia, unspecified: Secondary | ICD-10-CM | POA: Insufficient documentation

## 2022-11-26 DIAGNOSIS — G8929 Other chronic pain: Secondary | ICD-10-CM | POA: Diagnosis not present

## 2022-11-26 LAB — CMP (CANCER CENTER ONLY)
ALT: 13 U/L (ref 0–44)
AST: 13 U/L — ABNORMAL LOW (ref 15–41)
Albumin: 4.1 g/dL (ref 3.5–5.0)
Alkaline Phosphatase: 61 U/L (ref 38–126)
Anion gap: 7 (ref 5–15)
BUN: 11 mg/dL (ref 8–23)
CO2: 26 mmol/L (ref 22–32)
Calcium: 8.9 mg/dL (ref 8.9–10.3)
Chloride: 106 mmol/L (ref 98–111)
Creatinine: 0.56 mg/dL (ref 0.44–1.00)
GFR, Estimated: >60 mL/min (ref >60)
Glucose, Bld: 134 mg/dL — ABNORMAL HIGH (ref 70–99)
Potassium: 4 mmol/L (ref 3.5–5.1)
Sodium: 139 mmol/L (ref 135–145)
Total Bilirubin: 0.4 mg/dL (ref 0.3–1.2)
Total Protein: 6.5 g/dL (ref 6.5–8.1)

## 2022-11-26 LAB — POCT URINALYSIS DIP (MANUAL ENTRY)
Bilirubin, UA: NEGATIVE
Glucose, UA: 100 mg/dL — AB
Ketones, POC UA: NEGATIVE mg/dL
Nitrite, UA: NEGATIVE
Protein Ur, POC: 100 mg/dL — AB
Spec Grav, UA: 1.02 (ref 1.010–1.025)
Urobilinogen, UA: 0.2 E.U./dL
pH, UA: 6 (ref 5.0–8.0)

## 2022-11-26 LAB — CBC WITH DIFFERENTIAL (CANCER CENTER ONLY)
Abs Immature Granulocytes: 0.04 10*3/uL (ref 0.00–0.07)
Basophils Absolute: 0.1 10*3/uL (ref 0.0–0.1)
Basophils Relative: 1 %
Eosinophils Absolute: 0.3 10*3/uL (ref 0.0–0.5)
Eosinophils Relative: 2 %
HCT: 36.2 % (ref 36.0–46.0)
Hemoglobin: 12.1 g/dL (ref 12.0–15.0)
Immature Granulocytes: 0 %
Lymphocytes Relative: 18 %
Lymphs Abs: 2.5 10*3/uL (ref 0.7–4.0)
MCH: 31.7 pg (ref 26.0–34.0)
MCHC: 33.4 g/dL (ref 30.0–36.0)
MCV: 94.8 fL (ref 80.0–100.0)
Monocytes Absolute: 0.8 10*3/uL (ref 0.1–1.0)
Monocytes Relative: 6 %
Neutro Abs: 10.2 10*3/uL — ABNORMAL HIGH (ref 1.7–7.7)
Neutrophils Relative %: 73 %
Platelet Count: 233 10*3/uL (ref 150–400)
RBC: 3.82 MIL/uL — ABNORMAL LOW (ref 3.87–5.11)
RDW: 15.6 % — ABNORMAL HIGH (ref 11.5–15.5)
WBC Count: 13.9 10*3/uL — ABNORMAL HIGH (ref 4.0–10.5)
nRBC: 0 % (ref 0.0–0.2)

## 2022-11-26 LAB — POCT UA - MICROSCOPIC ONLY
RBC, Urine, Miroscopic: >20 (ref 0–2)
WBC, Ur, HPF, POC: >20 (ref 0–5)

## 2022-11-26 LAB — RETIC PANEL
Immature Retic Fract: 14.8 % (ref 2.3–15.9)
RBC.: 3.8 MIL/uL — ABNORMAL LOW (ref 3.87–5.11)
Retic Count, Absolute: 61.2 10*3/uL (ref 19.0–186.0)
Retic Ct Pct: 1.6 % (ref 0.4–3.1)
Reticulocyte Hemoglobin: 36.6 pg (ref >27.9)

## 2022-11-26 LAB — IRON AND IRON BINDING CAPACITY (CC-WL,HP ONLY)
Iron: 89 ug/dL (ref 28–170)
Saturation Ratios: 26 % (ref 10.4–31.8)
TIBC: 342 ug/dL (ref 250–450)
UIBC: 253 ug/dL (ref 148–442)

## 2022-11-26 LAB — POCT GLYCOSYLATED HEMOGLOBIN (HGB A1C): HbA1c, POC (controlled diabetic range): 6.6 % (ref 0.0–7.0)

## 2022-11-26 LAB — FERRITIN: Ferritin: 172 ng/mL (ref 11–307)

## 2022-11-26 MED ORDER — OXYCODONE-ACETAMINOPHEN 10-325 MG PO TABS
1.0000 | ORAL_TABLET | Freq: Four times a day (QID) | ORAL | 0 refills | Status: DC | PRN
Start: 2022-12-15 — End: 2023-03-04

## 2022-11-26 MED ORDER — OXYCODONE-ACETAMINOPHEN 10-325 MG PO TABS
1.0000 | ORAL_TABLET | Freq: Four times a day (QID) | ORAL | 0 refills | Status: DC | PRN
Start: 2023-01-14 — End: 2023-03-04

## 2022-11-26 MED ORDER — OXYCODONE-ACETAMINOPHEN 10-325 MG PO TABS
1.0000 | ORAL_TABLET | Freq: Four times a day (QID) | ORAL | 0 refills | Status: DC | PRN
Start: 2023-02-14 — End: 2023-03-04

## 2022-11-26 MED ORDER — CEPHALEXIN 500 MG PO CAPS
500.0000 mg | ORAL_CAPSULE | Freq: Two times a day (BID) | ORAL | 0 refills | Status: DC
Start: 2022-11-26 — End: 2022-11-28

## 2022-11-26 MED ORDER — FLUCONAZOLE 150 MG PO TABS
150.0000 mg | ORAL_TABLET | Freq: Once | ORAL | 0 refills | Status: AC
Start: 2022-11-26 — End: 2022-11-26

## 2022-11-26 NOTE — Assessment & Plan Note (Signed)
Patient stable on chronic opioids for several years to treat pain related to rheumatoid arthritis.  PDMP reviewed, appropriate.  Additional 3 months of oxycodone-acetaminophen provided.

## 2022-11-26 NOTE — Assessment & Plan Note (Signed)
Chronic, well-controlled we will continue current medication regimen dulaglutide and metformin.

## 2022-11-26 NOTE — Patient Instructions (Addendum)
It was nice seeing you today!  Take the antibiotics as prescribed.  You can take the Diflucan after you finish your antibiotics.  I will let you know if we need to change the antibiotics.  Stay well, Michelle Deeds, MD [facility] [phone]  --  Make sure to check out at the front desk before you leave today.  Please arrive at least 15 minutes prior to your scheduled appointments.  If you had blood work today, I will send you a MyChart message or a letter if results are normal. Otherwise, I will give you a call.  If you had a referral placed, they will call you to set up an appointment. Please give [place] a call if you don't hear back in the next 2 weeks.  If you need additional refills before your next appointment, please call your pharmacy first.

## 2022-11-26 NOTE — Progress Notes (Signed)
SUBJECTIVE:   CHIEF COMPLAINT / HPI:  Chief Complaint  Patient presents with   Diabetes   Urinary Frequency    Started having urinary urgency and urinary frequency since yesterday morning. Having some suprabupic pain. Denies fever, chills, dysuria.  Reports good adherence to her diabetic medications.  Reports that she is currently taking dulaglutide 3 mg weekly.  Patient also requesting refill of her Percocet for chronic pain related to rheumatoid arthritis.  She has been stable on current dose of Percocet for several years.  She has continued to take the Percocet every 6 hours scheduled, has not been able to tolerate spacing out the doses.  PERTINENT  PMH / PSH: HFrEF, asthma, T2DM, RA, chronic pain, HLD, GAD  Patient Care Team: Littie Deeds, MD as PCP - General (Family Medicine) Glendale Chard, DO as Consulting Physician (Neurology)   OBJECTIVE:   BP (!) 124/58   Pulse 88   Ht 5\' 2"  (1.575 m)   Wt 128 lb (58.1 kg)   SpO2 97%   BMI 23.41 kg/m   Physical Exam Constitutional:      General: She is not in acute distress. HENT:     Head: Normocephalic and atraumatic.  Cardiovascular:     Rate and Rhythm: Normal rate and regular rhythm.  Pulmonary:     Effort: Pulmonary effort is normal. No respiratory distress.     Comments: Diffuse coarse rales Musculoskeletal:     Cervical back: Neck supple.  Neurological:     Mental Status: She is alert.         11/26/2022   10:57 AM  Depression screen PHQ 2/9  Decreased Interest 0  Down, Depressed, Hopeless 0  PHQ - 2 Score 0  Altered sleeping 2  Tired, decreased energy 2  Change in appetite 0  Feeling bad or failure about yourself  0  Trouble concentrating 0  Moving slowly or fidgety/restless 0  Suicidal thoughts 0  PHQ-9 Score 4  Difficult doing work/chores Somewhat difficult     {Show previous vital signs (optional):23777}  Last hemoglobin A1c  Results for orders placed or performed in visit on 11/26/22  (from the past 48 hour(s))  POCT A1C     Status: None   Collection Time: 11/26/22 10:54 AM  Result Value Ref Range   Hemoglobin A1C     HbA1c POC (<> result, manual entry)     HbA1c, POC (prediabetic range)     HbA1c, POC (controlled diabetic range) 6.6 0.0 - 7.0 %  POCT urinalysis dipstick     Status: Abnormal   Collection Time: 11/26/22 11:03 AM  Result Value Ref Range   Color, UA yellow yellow   Clarity, UA cloudy (A) clear   Glucose, UA =100 (A) negative mg/dL   Bilirubin, UA negative negative   Ketones, POC UA negative negative mg/dL   Spec Grav, UA 1.610 9.604 - 1.025   Blood, UA large (A) negative   pH, UA 6.0 5.0 - 8.0   Protein Ur, POC =100 (A) negative mg/dL   Urobilinogen, UA 0.2 0.2 or 1.0 E.U./dL   Nitrite, UA Negative Negative   Leukocytes, UA Large (3+) (A) Negative  POCT UA - Microscopic Only     Status: Abnormal   Collection Time: 11/26/22 11:03 AM  Result Value Ref Range   WBC, Ur, HPF, POC >20 0 - 5   RBC, Urine, Miroscopic >20 0 - 2   Bacteria, U Microscopic TRACE None - Trace   Epithelial  cells, urine per micros 0-3    *Note: Due to a large number of results and/or encounters for the requested time period, some results have not been displayed. A complete set of results can be found in Results Review.      ASSESSMENT/PLAN:   Problem List Items Addressed This Visit       Endocrine   Type II diabetes mellitus (HCC) - Primary    Chronic, well-controlled we will continue current medication regimen dulaglutide and metformin.      Relevant Orders   POCT A1C (Completed)   POCT UA - Microscopic Only (Completed)     Musculoskeletal and Integument   Seropositive rheumatoid arthritis   Relevant Medications   oxyCODONE-acetaminophen (PERCOCET) 10-325 MG tablet (Start on 12/15/2022)   oxyCODONE-acetaminophen (PERCOCET) 10-325 MG tablet (Start on 01/14/2023)   oxyCODONE-acetaminophen (PERCOCET) 10-325 MG tablet (Start on 02/14/2023)     Other   Encounter for  chronic pain management (Chronic)    Patient stable on chronic opioids for several years to treat pain related to rheumatoid arthritis.  PDMP reviewed, appropriate.  Additional 3 months of oxycodone-acetaminophen provided.      Relevant Medications   oxyCODONE-acetaminophen (PERCOCET) 10-325 MG tablet (Start on 12/15/2022)   oxyCODONE-acetaminophen (PERCOCET) 10-325 MG tablet (Start on 01/14/2023)   oxyCODONE-acetaminophen (PERCOCET) 10-325 MG tablet (Start on 02/14/2023)   Other Visit Diagnoses     Urinary urgency       Relevant Medications   cephALEXin (KEFLEX) 500 MG capsule   fluconazole (DIFLUCAN) 150 MG tablet   Other Relevant Orders   POCT urinalysis dipstick (Completed)   POCT UA - Microscopic Only (Completed)   Urine Culture       Urinary symptoms and UA suggest urinary tract infection.  Will treat empirically with cephalexin, send off urine culture and will provide her with fluconazole due to frequent yeast infections after antibiotics.  Return in about 3 months (around 02/26/2023) for f/u diabetes.   Littie Deeds, MD [facility]

## 2022-11-28 ENCOUNTER — Telehealth: Payer: Self-pay | Admitting: Family Medicine

## 2022-11-28 DIAGNOSIS — M222X1 Patellofemoral disorders, right knee: Secondary | ICD-10-CM | POA: Diagnosis not present

## 2022-11-28 DIAGNOSIS — M7050 Other bursitis of knee, unspecified knee: Secondary | ICD-10-CM | POA: Diagnosis not present

## 2022-11-28 DIAGNOSIS — B952 Enterococcus as the cause of diseases classified elsewhere: Secondary | ICD-10-CM

## 2022-11-28 DIAGNOSIS — M0579 Rheumatoid arthritis with rheumatoid factor of multiple sites without organ or systems involvement: Secondary | ICD-10-CM | POA: Diagnosis not present

## 2022-11-28 LAB — URINE CULTURE

## 2022-11-28 MED ORDER — AMOXICILLIN 500 MG PO TABS: 500.0000 mg | ORAL_TABLET | Freq: Three times a day (TID) | ORAL | 0 refills | Status: AC

## 2022-11-28 NOTE — Telephone Encounter (Signed)
Call patient with urine culture results which shows UTI due to Enterococcus species.  Will need to switch her antibiotics to amoxicillin.  Discussed this with patient.

## 2022-12-02 DIAGNOSIS — R0602 Shortness of breath: Secondary | ICD-10-CM | POA: Diagnosis not present

## 2022-12-02 DIAGNOSIS — J45909 Unspecified asthma, uncomplicated: Secondary | ICD-10-CM | POA: Diagnosis not present

## 2022-12-04 ENCOUNTER — Other Ambulatory Visit: Payer: Self-pay

## 2022-12-04 ENCOUNTER — Inpatient Hospital Stay: Payer: 59 | Attending: Hematology and Oncology | Admitting: Hematology and Oncology

## 2022-12-04 VITALS — BP 101/63 | HR 81 | Temp 98.2°F | Resp 17 | Ht 62.0 in | Wt 129.6 lb

## 2022-12-04 DIAGNOSIS — D509 Iron deficiency anemia, unspecified: Secondary | ICD-10-CM | POA: Insufficient documentation

## 2022-12-04 DIAGNOSIS — D72829 Elevated white blood cell count, unspecified: Secondary | ICD-10-CM | POA: Insufficient documentation

## 2022-12-04 DIAGNOSIS — D500 Iron deficiency anemia secondary to blood loss (chronic): Secondary | ICD-10-CM | POA: Diagnosis not present

## 2022-12-04 NOTE — Progress Notes (Signed)
[facility] Telephone:([phone]) [phone]   Fax:([phone]) [phone]  PROGRESS NOTE  Patient Care Team: Littie Deeds, MD as PCP - General (Family Medicine) Glendale Chard, DO as Consulting Physician (Neurology)  Hematological/Oncological History # Iron Deficiency Anemia of Unclear Etiology # Easy Bruising 07/30/2022: Hgb 10.5 08/29/2022: establish care with Dr. Leonides Schanz  09/12/2022-10/10/2022: IV iron sucrose 200 mg x 5 doses.  12/04/2022: White blood cell 13.9, hemoglobin 12.1, MCV 94.8, and platelets of 233  Interval History:  Michelle [name] 72 y.o. female with medical history significant for deficiency anemia who presents for a follow up visit. The patient's last visit was on 08/29/2022 at which time she established care. In the interim since the last visit she received 5 doses of IV iron sucrose from March 14 to October 10, 2022.  On exam today Michelle [name] orts that she tolerated her IV iron infusions quite well.  She not have any side effects and reports overall they went well.  She notes that she feels better overall but is still having symptoms of heart failure.  She reports that she "never has much energy".  She reports that she continues to have bruising, dominantly on her legs.  She normally takes aspirin but is been off it for 7 days because she is going to have all of her teeth pulled.  She does that she has not recently had any steroid therapy but did just completed amoxicillin course for urinary tract infection.  She reports her urinary tract symptoms have greatly improved with antibiotic therapy.  She is not have any other infectious symptoms such as runny nose, sore throat, or cough.  She notes she also recently had increase in her inhaler dosing.  Otherwise she denies any fevers, chills, sweats, nausea, vomiting or diarrhea.  A full 10 point ROS is otherwise negative.  MEDICAL HISTORY:  Past Medical History:  Diagnosis Date   Actinic keratosis    Allergic rhinitis 08/28/2006    Anemia    low iron   Anginal pain (HCC)    Asthma, chronic 03/14/2010   Does seem to use her albuterol excessively. Consider change to more manageable medicines and treat more like COPD in the future.   Carpal tunnel syndrome, left 05/29/2009   Last Assessment & Plan:  Advised to wear wrist brace at night when she is sleeping.  If this does not help will consider having her go to sports medicine.   Chronic frontal sinusitis 08/28/2016   Chronic left maxillary sinusitis 08/28/2016   Diverticulosis    Essential hypertension 10/30/2012   Gastroparesis 11/19/2011   DM and chronic narcotics contribute.  Causes dysphagia like symptoms    Generalized anxiety disorder 08/28/2006   GERD (gastroesophageal reflux disease)    Hearing loss 07/19/2015   Hearing aid in right ear   History of cataracts, bilateral    Hyperlipidemia 08/28/2006   Lumbar radiculopathy 04/07/2018   Lung nodules    Osteoporosis 08/08/2011   Pain in joint, pelvic region and thigh 11/18/2012   Radial styloid tenosynovitis 08/15/2013   Restless leg syndrome 05/14/2012   Seropositive rheumatoid arthritis 01/08/2010   Patient is being followed by rheumatologist at [facility] patient is on Plaquenil and as well as prednisone.   Changed to methotrexate and prednisone in May of 2013  Patient does have a pain contract here [facility] receives oxycodone 10/325 mg tabs every 6 hours. This has been titrated down from significant doses of methadone previously. Could  attempt to tit   Shoulder joint pain 03/25/2011   The patient is compensating due to her not being able to use her left arm right now rotator cuff impingement if he continues to worsen would not be surprised if she does get a tear on this side as well   Squamous cell carcinoma in situ of skin 11/25/2014   Referred to dermatology for excision    Squamous cell carcinoma of skin 12/26/2014   Right distal pretibial. SCC-KA pattern.    Squamous cell carcinoma of skin 03/27/2015   Right inf. lat. knee. SCC-KA pattern.    Squamous cell carcinoma of skin 10/07/2016   Left lateral calf superior. WD SCC.   Squamous cell carcinoma of skin 10/07/2016   Left lateral calf inferior. WD SCC.   Squamous cell carcinoma of skin 04/28/2018   Left mid med. pretibial. WD SCC.   Squamous cell carcinoma of skin 08/12/2019   Left mid med. pretibial sup. KA type. EDC   Squamous cell carcinoma of skin 08/12/2019   Left mid med. pretibial inferior. WD. EDC.   Tear of medial meniscus of knee, left 04/27/2009   Type II diabetes mellitus 08/28/2006   Overview:  Patient has her ups and downs, go in line with flares of her RA and prednisone uses.  Patient does have hx of hypoglycemic events so would not try for perfect control but should have A1c goal around 7.0 Lab Results  Component Value Date   HGBA1C 6.9 12/27/2011   Last Assessment & Plan:  At goal. Will continue current metformin    SURGICAL HISTORY: Past Surgical History:  Procedure Laterality Date   ABDOMINAL HYSTERECTOMY     APPENDECTOMY     BACK SURGERY     BIOPSY  05/10/2021   Procedure: BIOPSY;  Surgeon: Rachael Fee, MD;  Location: [facility];  Service: Endoscopy;;  EGD and COLON   CARPAL TUNNEL RELEASE Right    CATARACT EXTRACTION Left    with lid lift   CHOLECYSTECTOMY     COLONOSCOPY     COLONOSCOPY WITH PROPOFOL N/A 05/10/2021   Procedure: COLONOSCOPY WITH PROPOFOL;  Surgeon: Rachael Fee, MD;  Location: [facility];  Service: Endoscopy;  Laterality: N/A;   ESOPHAGOGASTRODUODENOSCOPY (EGD) WITH PROPOFOL N/A 05/10/2021   Procedure: ESOPHAGOGASTRODUODENOSCOPY (EGD) WITH PROPOFOL;  Surgeon: Rachael Fee, MD;  Location: [facility];  Service: Endoscopy;  Laterality: N/A;   HERNIA REPAIR     KNEE SURGERY Right    x2- cartilage annd scar tissue   LEFT AND RIGHT HEART CATHETERIZATION WITH CORONARY ANGIOGRAM N/A 08/17/2013   Procedure: LEFT AND RIGHT HEART  CATHETERIZATION WITH CORONARY ANGIOGRAM;  Surgeon: Pamella Pert, MD;  Location: [facility] CATH LAB;  Service: Cardiovascular;  Laterality: N/A;   lumbar back surgery  04/04/2018   done by neurosurgeron   right foot bone spurs removed     RIGHT/LEFT HEART CATH AND CORONARY ANGIOGRAPHY N/A 07/30/2022   Procedure: RIGHT/LEFT HEART CATH AND CORONARY ANGIOGRAPHY;  Surgeon: Elder Negus, MD;  Location: [facility] INVASIVE CV LAB;  Service: Cardiovascular;  Laterality: N/A;   ROTATOR CUFF REPAIR Right    SHOULDER SURGERY Left    x2- rotatar cuff tear   UPPER GASTROINTESTINAL ENDOSCOPY      SOCIAL HISTORY: Social History   Socioeconomic History   Marital status: Married    Spouse name: Warehouse manager   Number of children: 3   Years of education: 9   Highest education level: 9th grade  Occupational  History   Occupation: Retired/disabled    Associate Professor: UNEMPLOYED  Tobacco Use   Smoking status: Former    Packs/day: 1.50    Years: 5.00    Additional pack years: 0.00    Total pack years: 7.50    Types: Cigarettes    Quit date: 05/31/1994    Years since quitting: 28.5    Passive exposure: Past   Smokeless tobacco: Never  Vaping Use   Vaping Use: Never used  Substance and Sexual Activity   Alcohol use: No    Alcohol/week: 0.0 standard drinks of alcohol   Drug use: No   Sexual activity: Not on file  Other Topics Concern   Not on file  Social History Narrative   Pt is on MAP program [place]   Lives with husband and granddgt.   Lots of family stressors including special-needs grandaughter who lives with them.      Advance Directives: None - Patient's plan is that her husband will make decisions for her if she were not capable of making informed decisions for herself. (Discussion with Tawanna Cooler McDiarmid, MD 12/11/18)   Full Code: Desires CPR/ACLS  (Discussion with Tawanna Cooler McDiarmid, MD 12/11/18)         Social Determinants of Health   Financial Resource Strain: Low Risk  (09/13/2022)   Overall  Financial Resource Strain (CARDIA)    Difficulty of Paying Living Expenses: Not hard at all  Food Insecurity: No Food Insecurity (09/13/2022)   Hunger Vital Sign    Worried About Running Out of Food in the Last Year: Never true    Ran Out of Food in the Last Year: Never true  Transportation Needs: No Transportation Needs (09/13/2022)   PRAPARE - Administrator, Civil Service (Medical): No    Lack of Transportation (Non-Medical): No  Physical Activity: Inactive (09/13/2022)   Exercise Vital Sign    Days of Exercise per Week: 0 days    Minutes of Exercise per Session: 0 min  Stress: No Stress Concern Present (09/13/2022)   [employer] of Occupational Health - Occupational Stress Questionnaire    Feeling of Stress : Not at all  Social Connections: Moderately Isolated (09/13/2022)   Social Connection and Isolation Panel [NHANES]    Frequency of Communication with Friends and Family: More than three times a week    Frequency of Social Gatherings with Friends and Family: More than three times a week    Attends Religious Services: Never    Database administrator or Organizations: No    Attends Banker Meetings: Never    Marital Status: Married  Catering manager Violence: Not At Risk (09/13/2022)   Humiliation, Afraid, Rape, and Kick questionnaire    Fear of Current or Ex-Partner: No    Emotionally Abused: No    Physically Abused: No    Sexually Abused: No    FAMILY HISTORY: Family History  Problem Relation Age of Onset   Stroke Father 66   Colon cancer Maternal Aunt    Tremor Mother    Lung cancer Brother 46   Rheum arthritis Sister 54   Colon cancer Daughter    COPD Sister    Cancer Sister        in nose   Diabetes Daughter    Hypertension Daughter    Healthy Daughter    Bell's palsy Maternal Grandmother    Esophageal cancer Neg Hx    Rectal cancer Neg Hx    Stomach cancer Neg Hx  ALLERGIES:  is allergic to zofran [ondansetron hcl], ace  inhibitors, levofloxacin, lisinopril, sulfonamide derivatives, and sulfa antibiotics.  MEDICATIONS:  Current Outpatient Medications  Medication Sig Dispense Refill   Accu-Chek Softclix Lancets lancets Please use to check blood sugar up to two times per day. E11.42 200 each 4   acetaminophen (TYLENOL) 650 MG CR tablet Take 650 mg by mouth every 8 (eight) hours as needed for pain.     aspirin EC 81 MG tablet Take 81 mg by mouth daily. Swallow whole.     betamethasone dipropionate 0.05 % lotion Apply topically as directed. Apply to the scalp daily up to 5 days for scalp (Patient taking differently: Apply 1 application  topically daily as needed (scalp irritation). Apply to the scalp daily up to 5 days for scalp) 60 mL 2   Blood Glucose Monitoring Suppl (ONE TOUCH ULTRA 2) w/Device KIT USE AS DIRECTED 1 kit 0   BREO ELLIPTA 100-25 MCG/ACT AEPB Inhale 1 puff by mouth once daily 180 each 0   clobetasol (TEMOVATE) 0.05 % external solution Apply 1 application  topically daily. To scalp up to 5 times per week 50 mL 3   cromolyn (OPTICROM) 4 % ophthalmic solution Place 1 drop into both eyes 3 (three) times daily as needed (itchy eyes).     diclofenac Sodium (VOLTAREN) 1 % GEL Apply 2 g topically 4 (four) times daily.     Dulaglutide 0.75 MG/0.5ML SOPN Inject 0.75 mg into the skin once a week. 6 mL 0   fluticasone (FLONASE) 50 MCG/ACT nasal spray Place 2 sprays into both nostrils daily as needed for allergies. 144 mL 1   gabapentin (NEURONTIN) 300 MG capsule TAKE 1 CAPSULE BY MOUTH EVERYDAY AT BEDTIME 90 capsule 3   glucose blood test strip Use as instructed a few times a week as needed 100 each 12   glucose blood test strip Use to check blood glucose 2 times a day as needed 100 each 12   hydrOXYzine (ATARAX) 25 MG tablet TAKE 1-2 TABLETS BY MOUTH AT BEDTIME AS NEEDED FOR ITCH. MAY MAKE DROWSY. DO NOT DRIVE AFTER TAKING 60 tablet 0   leflunomide (ARAVA) 10 MG tablet Take 10 mg by mouth daily.     metFORMIN  (GLUCOPHAGE-XR) 500 MG 24 hr tablet TAKE 2 TABLETS BY MOUTH TWICE A DAY 360 tablet 1   metoprolol succinate (TOPROL-XL) 25 MG 24 hr tablet Take 1 tablet (25 mg total) by mouth at bedtime. 90 tablet 1   mirabegron ER (MYRBETRIQ) 50 MG TB24 tablet Take 50 mg by mouth in the morning.     mometasone (ELOCON) 0.1 % cream APPLY 1 APPLICATION TOPICALLY DAILY AS NEEDED (RASH). 45 g 11   montelukast (SINGULAIR) 10 MG tablet TAKE 1 TABLET BY MOUTH EVERY DAY (Patient taking differently: Take 10 mg by mouth at bedtime.) 30 tablet 0   mupirocin ointment (BACTROBAN) 2 % APPLY TOPICALLY ONCE DAILY TO OPEN SORES ON ARMS, LEGS AND TOES UNTIL HEALED. THEN APPLY TOPICALLY TO AFFECTED AREAS AS NEEDED FOR FLARES 22 g 0   nitroGLYCERIN (NITROSTAT) 0.4 MG SL tablet Place 1 tablet (0.4 mg total) under the tongue every 5 (five) minutes as needed. 25 tablet 1   omeprazole (PRILOSEC) 40 MG capsule Take 1 capsule (40 mg total) by mouth in the morning. 90 capsule 1   [START ON 12/15/2022] oxyCODONE-acetaminophen (PERCOCET) 10-325 MG tablet Take 1 tablet by mouth every 6 (six) hours as needed for pain. 120 tablet 0   [  START ON 01/14/2023] oxyCODONE-acetaminophen (PERCOCET) 10-325 MG tablet Take 1 tablet by mouth every 6 (six) hours as needed for pain. 120 tablet 0   [START ON 02/14/2023] oxyCODONE-acetaminophen (PERCOCET) 10-325 MG tablet Take 1 tablet by mouth every 6 (six) hours as needed for pain. 120 tablet 0   rosuvastatin (CRESTOR) 10 MG tablet Take 1 tablet (10 mg total) by mouth daily. (Patient taking differently: Take 10 mg by mouth every evening.) 90 tablet 3   sacubitril-valsartan (ENTRESTO) 24-26 MG Take 1 tablet by mouth 2 (two) times daily. 180 tablet 1   sertraline (ZOLOFT) 50 MG tablet TAKE 1 TABLET BY MOUTH EVERY DAY 90 tablet 3   VENTOLIN HFA 108 (90 Base) MCG/ACT inhaler INHALE 2 PUFFS BY MOUTH EVERY 4 HOURS AS NEEDED FOR WHEEZING OR SHORTNESS OF BREATH 108 each 11   No current facility-administered medications  for this visit.    REVIEW OF SYSTEMS:   Constitutional: ( - ) fevers, ( - )  chills , ( - ) night sweats Eyes: ( - ) blurriness of vision, ( - ) double vision, ( - ) watery eyes Ears, nose, mouth, throat, and face: ( - ) mucositis, ( - ) sore throat Respiratory: ( - ) cough, ( - ) dyspnea, ( - ) wheezes Cardiovascular: ( - ) palpitation, ( - ) chest discomfort, ( - ) lower extremity swelling Gastrointestinal:  ( - ) nausea, ( - ) heartburn, ( - ) change in bowel habits Skin: ( - ) abnormal skin rashes Lymphatics: ( - ) new lymphadenopathy, ( - ) easy bruising Neurological: ( - ) numbness, ( - ) tingling, ( - ) new weaknesses Behavioral/Psych: ( - ) mood change, ( - ) new changes  All other systems were reviewed with the patient and are negative.  PHYSICAL EXAMINATION:  Vitals:   12/04/22 0827  BP: 101/63  Pulse: 81  Resp: 17  Temp: 98.2 F (36.8 C)  SpO2: 98%   Filed Weights   12/04/22 0827  Weight: 129 lb 9.6 oz (58.8 kg)    GENERAL: Well-appearing elderly Caucasian female, alert, no distress and comfortable SKIN: skin color, texture, turgor are normal, no rashes or significant lesions EYES: conjunctiva are pink and non-injected, sclera clear LUNGS: clear to auscultation and percussion with normal breathing effort HEART: regular rate & rhythm and no murmurs and no lower extremity edema Musculoskeletal: no cyanosis of digits and no clubbing  PSYCH: alert & oriented x 3, fluent speech NEURO: no focal motor/sensory deficits  LABORATORY DATA:  I have reviewed the data as listed    Latest Ref Rng & Units 11/26/2022    9:00 AM 08/29/2022   10:25 AM 07/30/2022    8:32 AM  CBC  WBC 4.0 - 10.5 K/uL 13.9  9.4    Hemoglobin 12.0 - 15.0 g/dL 95.2  84.1  32.4   Hematocrit 36.0 - 46.0 % 36.2  34.9  31.0   Platelets 150 - 400 K/uL 233  281         Latest Ref Rng & Units 11/26/2022    9:00 AM 08/29/2022   10:25 AM 08/16/2022   11:44 AM  CMP  Glucose 70 - 99 mg/dL 401  027  253    BUN 8 - 23 mg/dL 11  13  12    Creatinine 0.44 - 1.00 mg/dL 6.64  4.03  4.74   Sodium 135 - 145 mmol/L 139  137  138   Potassium 3.5 - 5.1 mmol/L 4.0  4.4  4.5   Chloride 98 - 111 mmol/L 106  102  102   CO2 22 - 32 mmol/L 26  28  20    Calcium 8.9 - 10.3 mg/dL 8.9  9.3  8.8   Total Protein 6.5 - 8.1 g/dL 6.5  6.7    Total Bilirubin 0.3 - 1.2 mg/dL 0.4  0.3    Alkaline Phos 38 - 126 U/L 61  88    AST 15 - 41 U/L 13  19    ALT 0 - 44 U/L 13  14      Lab Results  Component Value Date   MPROTEIN Not Observed 08/29/2022   Lab Results  Component Value Date   KPAFRELGTCHN 23.0 (H) 08/29/2022   LAMBDASER 18.6 08/29/2022   KAPLAMBRATIO 1.24 08/29/2022     RADIOGRAPHIC STUDIES: No results found.  ASSESSMENT & PLAN JULIEANNA AGYEMAN 72 y.o. female with medical history significant for deficiency anemia who presents for a follow up visit.    After review of the labs, review of the records, and discussion with the patient the patients findings are most consistent with iron deficiency anemia of unclear cause, suspect GI bleeding.  Additionally is possible the patient has anemia of chronic disease due to her rheumatoid arthritis.   # Iron Deficiency Anemia 2/2 to Unclear Cause, suspect GI Bleeding -- Findings are consistent with iron deficiency anemia, etiology is unclear.  --Encouraged her to follow-up with GI if she is found to be iron deficient.  --patient is s/p IV iron sucrose 200 mg x 5 doses in March/April 2024 --labs show White blood cell 13.9, hemoglobin 12.1, MCV 94.8, and platelets of 233 --Plan for return to clinic in 6 months with interval labs in 3 months  # Leukocytosis --patient will be completing course of Abx for UTI today -- likely 2/2 to UTI, may be due to RA.  -- no recent steroid therapy -- continue to monitor.    No orders of the defined types were placed in this encounter.   All questions were answered. The patient knows to call the clinic with any problems,  questions or concerns.  A total of more than 30 minutes were spent on this encounter with face-to-face time and non-face-to-face time, including preparing to see the patient, ordering tests and/or medications, counseling the patient and coordination of care as outlined above.   Ulysees Barns, MD Department of Hematology/Oncology [facility] Phone: [phone] Pager: [phone] Email: [email or web address]  12/04/2022 8:58 AM

## 2022-12-06 ENCOUNTER — Other Ambulatory Visit: Payer: Self-pay | Admitting: Dermatology

## 2022-12-06 ENCOUNTER — Telehealth: Payer: Self-pay

## 2022-12-06 DIAGNOSIS — T148XXA Other injury of unspecified body region, initial encounter: Secondary | ICD-10-CM

## 2022-12-06 DIAGNOSIS — G8929 Other chronic pain: Secondary | ICD-10-CM

## 2022-12-06 DIAGNOSIS — L409 Psoriasis, unspecified: Secondary | ICD-10-CM

## 2022-12-06 NOTE — Telephone Encounter (Signed)
-----   Message from Lizbeth Bark sent at 12/06/2022  9:00 AM EDT ----- Regarding: phone message Pt states she is ready to get her Rt knee xray due to still having pain.

## 2022-12-06 NOTE — Telephone Encounter (Signed)
Order placed

## 2022-12-09 ENCOUNTER — Ambulatory Visit
Admission: RE | Admit: 2022-12-09 | Discharge: 2022-12-09 | Disposition: A | Discharge status: Home / Self Care | Payer: 59 | Source: Ambulatory Visit | Attending: Sports Medicine | Admitting: Sports Medicine

## 2022-12-09 DIAGNOSIS — G8929 Other chronic pain: Secondary | ICD-10-CM

## 2022-12-09 DIAGNOSIS — M25561 Pain in right knee: Secondary | ICD-10-CM | POA: Diagnosis not present

## 2022-12-11 ENCOUNTER — Encounter: Payer: Self-pay | Admitting: *Deleted

## 2022-12-11 ENCOUNTER — Telehealth: Payer: Self-pay | Admitting: Sports Medicine

## 2022-12-11 ENCOUNTER — Other Ambulatory Visit: Payer: Self-pay | Admitting: *Deleted

## 2022-12-11 ENCOUNTER — Telehealth: Payer: Self-pay

## 2022-12-11 ENCOUNTER — Other Ambulatory Visit (HOSPITAL_COMMUNITY): Payer: Self-pay

## 2022-12-11 ENCOUNTER — Encounter: Payer: Self-pay | Admitting: Hematology and Oncology

## 2022-12-11 DIAGNOSIS — G8929 Other chronic pain: Secondary | ICD-10-CM

## 2022-12-11 DIAGNOSIS — E1142 Type 2 diabetes mellitus with diabetic polyneuropathy: Secondary | ICD-10-CM

## 2022-12-11 MED ORDER — DULAGLUTIDE 3 MG/0.5ML ~~LOC~~ SOAJ
3.0000 mg | SUBCUTANEOUS | 2 refills | Status: DC
Start: 2022-12-11 — End: 2023-06-02
  Filled 2022-12-11: qty 2, 28d supply, fill #0
  Filled 2022-12-12: qty 6, 84d supply, fill #0

## 2022-12-11 NOTE — Telephone Encounter (Signed)
Patient calls nurse line reporting difficulty picking up Trulicity 3mg .   She reports her pharmacy is out of stock indefinitely. I called and verified this at [facility].   I called [facility] and they report stock of Trulicity 3mg .   Please send there.

## 2022-12-11 NOTE — Telephone Encounter (Signed)
Trulicity 3 mg sent to [facility]

## 2022-12-11 NOTE — Telephone Encounter (Signed)
  I spoke to [place] after reviewing the x-rays of her right knee.  She has some mild medial compartmental degenerative changes but not severe.  She continues to have pain despite a recent cortisone injection.  I recommend that we order an MRI to evaluate further, specifically to rule out meniscus pathology.  I will follow-up with her via telephone with those results when available.

## 2022-12-12 ENCOUNTER — Other Ambulatory Visit (HOSPITAL_COMMUNITY): Payer: Self-pay

## 2022-12-12 NOTE — Telephone Encounter (Signed)
Contacted patient.   Advised medication was sent to [place] as they have stock.   Patient appreciative.

## 2022-12-21 ENCOUNTER — Ambulatory Visit
Admission: RE | Admit: 2022-12-21 | Discharge: 2022-12-21 | Disposition: A | Discharge status: Home / Self Care | Payer: 59 | Source: Ambulatory Visit | Attending: Sports Medicine | Admitting: Sports Medicine

## 2022-12-21 DIAGNOSIS — S83231A Complex tear of medial meniscus, current injury, right knee, initial encounter: Secondary | ICD-10-CM | POA: Diagnosis not present

## 2022-12-21 DIAGNOSIS — M948X6 Other specified disorders of cartilage, lower leg: Secondary | ICD-10-CM | POA: Diagnosis not present

## 2022-12-21 DIAGNOSIS — M25561 Pain in right knee: Secondary | ICD-10-CM | POA: Diagnosis not present

## 2022-12-21 DIAGNOSIS — G8929 Other chronic pain: Secondary | ICD-10-CM

## 2022-12-24 ENCOUNTER — Other Ambulatory Visit: Payer: 59

## 2022-12-24 ENCOUNTER — Other Ambulatory Visit: Payer: Self-pay | Admitting: Family Medicine

## 2022-12-24 DIAGNOSIS — J45909 Unspecified asthma, uncomplicated: Secondary | ICD-10-CM

## 2022-12-28 ENCOUNTER — Other Ambulatory Visit: Payer: Self-pay | Admitting: Family Medicine

## 2022-12-28 DIAGNOSIS — R3915 Urgency of urination: Secondary | ICD-10-CM

## 2023-01-04 ENCOUNTER — Other Ambulatory Visit: Payer: Self-pay | Admitting: Dermatology

## 2023-01-04 DIAGNOSIS — L409 Psoriasis, unspecified: Secondary | ICD-10-CM

## 2023-01-08 NOTE — Progress Notes (Signed)
Referral to Dr. Everardo Pacific for right knee meniscus tear [facility]  Appt: 01/14/23 @ 9:15 am

## 2023-01-08 NOTE — Telephone Encounter (Signed)
I spoke with Michelle [name] on the phone today after reviewing MRI findings of her right knee.  MRI shows a complex tear of the posterior horn and body junction of the medial meniscus.  This is in the setting of some moderate medial compartmental DJD and patellofemoral DJD.  Since she continues to struggle despite a recent cortisone injection, I recommend consultation with Dr. Everardo Pacific to discuss next steps in treatment.  She is amenable to this plan.  I will defer further workup and treatment to the discretion of Dr. Everardo Pacific and [name] will follow-up with me as needed.  This note was dictated using Dragon naturally speaking software and may contain errors in syntax, spelling, or content which have not been identified prior to signing this note.

## 2023-01-14 DIAGNOSIS — S83241A Other tear of medial meniscus, current injury, right knee, initial encounter: Secondary | ICD-10-CM | POA: Diagnosis not present

## 2023-01-20 ENCOUNTER — Other Ambulatory Visit: Payer: Self-pay

## 2023-01-21 MED ORDER — TRULICITY 3 MG/0.5ML ~~LOC~~ SOAJ: 3.0000 mg | SUBCUTANEOUS | 1 refills | Status: DC

## 2023-01-21 MED ORDER — FLUTICASONE FUROATE-VILANTEROL 100-25 MCG/ACT IN AEPB: 1.0000 | INHALATION_SPRAY | Freq: Every day | RESPIRATORY_TRACT | 0 refills | Status: DC

## 2023-01-26 ENCOUNTER — Encounter: Payer: Self-pay | Admitting: Cardiology

## 2023-02-04 ENCOUNTER — Ambulatory Visit (INDEPENDENT_AMBULATORY_CARE_PROVIDER_SITE_OTHER): Payer: 59 | Admitting: Dermatology

## 2023-02-04 ENCOUNTER — Encounter: Payer: Self-pay | Admitting: Dermatology

## 2023-02-04 VITALS — BP 127/85 | HR 89 | Resp 20 | Ht 62.0 in | Wt 127.0 lb

## 2023-02-04 DIAGNOSIS — Z79899 Other long term (current) drug therapy: Secondary | ICD-10-CM

## 2023-02-04 DIAGNOSIS — L578 Other skin changes due to chronic exposure to nonionizing radiation: Secondary | ICD-10-CM | POA: Diagnosis not present

## 2023-02-04 DIAGNOSIS — Z7189 Other specified counseling: Secondary | ICD-10-CM | POA: Diagnosis not present

## 2023-02-04 DIAGNOSIS — L409 Psoriasis, unspecified: Secondary | ICD-10-CM

## 2023-02-04 DIAGNOSIS — L570 Actinic keratosis: Secondary | ICD-10-CM

## 2023-02-04 DIAGNOSIS — L820 Inflamed seborrheic keratosis: Secondary | ICD-10-CM

## 2023-02-04 DIAGNOSIS — W908XXA Exposure to other nonionizing radiation, initial encounter: Secondary | ICD-10-CM | POA: Diagnosis not present

## 2023-02-04 MED ORDER — CLOBETASOL PROPIONATE 0.05 % EX SOLN: 1.0000 | Freq: Every day | CUTANEOUS | 2 refills | Status: DC

## 2023-02-04 MED ORDER — HYDROXYZINE HCL 25 MG PO TABS
ORAL_TABLET | ORAL | 0 refills | Status: DC
Start: 2023-02-04 — End: 2023-03-05

## 2023-02-04 MED ORDER — MOMETASONE FUROATE 0.1 % EX CREA
TOPICAL_CREAM | CUTANEOUS | 2 refills | Status: AC
Start: 2023-02-04 — End: ?

## 2023-02-04 NOTE — Progress Notes (Signed)
Follow-Up Visit   Subjective  Michelle [name] is a 72 y.o. female who presents for the following: Psoriasis  Patient present today for follow up visit for Psoriasis. Patient was last evaluated on 01/24/2022. Patient reports sxs are improved but not at goal. Patient reports no medication changes.She ran out Mometasone cream qd up to 5 days a week to face and toes until clear, then as needed for flares & Clobetasol solution qd up to 5 days a week to scalp as needed for flares, avoid face, groin, axilla (script sent in last visit). Patient reports she has been out of refills about 2 months ago and would like refills today. She also is requesting a refill on the hydroxzine.   The following portions of the chart were reviewed this encounter and updated as appropriate: medications, allergies, medical history  Review of Systems:  No other skin or systemic complaints except as noted in HPI or Assessment and Plan.  Objective  Well appearing patient in no apparent distress; mood and affect are within normal limits.  A focused examination was performed of the following areas: L post scalp, R post scalp, L scapula Scalp flared today  Relevant exam findings are noted in the Assessment and Plan. Left Forearm - Posterior Stuck-on, waxy, tan-brown papules and plaques -- Discussed benign etiology and prognosis.   Dorsum of Nose, Left Malar Cheek, Mid Forehead, Right Malar Cheek Erythematous thin papules/macules with gritty scale.    Assessment & Plan   PSORIASIS Exam: Mild flaking on scalp  Counseling and coordination of care for severe psoriasis on systemic treatment  psoriasis - severe on systemic treatment.  Psoriasis is a chronic non-curable, but treatable genetic/hereditary disease that may have other systemic features affecting other organ systems such as joints (Psoriatic Arthritis).  It is linked with heart disease, inflammatory bowel disease, non-alcoholic fatty liver disease, and  depression. Significant skin psoriasis and/or psoriatic arthritis may have significant symptoms and affects activities of daily activity and often benefits from systemic treatments.  These systemic treatments have some potential side effects including immunosuppression and require pre-treatment laboratory screening and periodic laboratory monitoring and periodic in person evaluation and monitoring by the attending dermatologist physician (long term medication management).  Reviewed risks of biologics including immunosuppression, infections, injection site reaction, and failure to improve condition. Goal is control of skin condition, not cure.  Some older biologics such as Humira and Enbrel may slightly increase risk of malignancy and may worsen congestive heart failure.  Taltz and Cosentyx may cause inflammatory bowel disease to flare. The use of biologics requires long term medication management, including periodic office visits and monitoring of blood work.   Chronic and persistent condition with duration or expected duration over one year. Condition is symptomatic / bothersome to patient. Not to goal.   Patient denies joint pain  Psoriasis is a chronic non-curable, but treatable genetic/hereditary disease that may have other systemic features affecting other organ systems such as joints (Psoriatic Arthritis). It is associated with an increased risk of inflammatory bowel disease, heart disease, non-alcoholic fatty liver disease, and depression.  Treatments include light and laser treatments; topical medications; and systemic medications including oral and injectables.  Treatment Plan: - Otezla Start titrating from 10 mg to 30mg  every day, sample provided today, Instructed, Patient provided with verbal titration. Patient voiced understanding  -Will plan to follow up 4-8 weeks to determine dosages  Inflamed seborrheic keratosis Left Forearm - Posterior  Symptomatic, irritating, patient would like  treated.  Benign-appearing.  Call clinic for new or changing lesions.   Destruction of lesion - Left Forearm - Posterior Complexity: simple   Destruction method: cryotherapy   Informed consent: discussed and consent obtained   Timeout:  patient name, date of birth, surgical site, and procedure verified Lesion destroyed using liquid nitrogen: Yes   Region frozen until ice ball extended beyond lesion: Yes   Outcome: patient tolerated procedure well with no complications   Post-procedure details: wound care instructions given    ACTINIC KERATOSIS Exam: Erythematous thin papules/macules with gritty scale at the Cheeks & Nasal Bridge  Actinic keratoses are precancerous spots that appear secondary to cumulative UV radiation exposure/sun exposure over time. They are chronic with expected duration over 1 year. A portion of actinic keratoses will progress to squamous cell carcinoma of the skin. It is not possible to reliably predict which spots will progress to skin cancer and so treatment is recommended to prevent development of skin cancer.  Recommend daily broad spectrum sunscreen SPF 30+ to sun-exposed areas, reapply every 2 hours as needed.  Recommend staying in the shade or wearing long sleeves, sun glasses (UVA+UVB protection) and wide brim hats (4-inch brim around the entire circumference of the hat). Call for new or changing lesions.  AK (actinic keratosis) (4) Mid Forehead; Dorsum of Nose; Left Malar Cheek; Right Malar Cheek  Destruction of lesion - Dorsum of Nose, Left Malar Cheek, Mid Forehead, Right Malar Cheek Complexity: simple   Destruction method: cryotherapy   Informed consent: discussed and consent obtained   Timeout:  patient name, date of birth, surgical site, and procedure verified Lesion destroyed using liquid nitrogen: Yes   Region frozen until ice ball extended beyond lesion: Yes   Cryotherapy cycles:  4 Outcome: patient tolerated procedure well with no complications    Post-procedure details: wound care instructions given    Reviewed course of treatment and expected reaction.  Patient advised to expect inflammation and crusting and advised that erosions are possible.  Patient advised to be diligent with sun protection during and after treatment. Handout with details of how to apply medication and what to expect provided. Counseled to keep medication out of reach of children and pets.  ACTINIC DAMAGE - chronic, secondary to cumulative UV radiation exposure/sun exposure over time - diffuse scaly erythematous macules with underlying dyspigmentation - Recommend daily broad spectrum sunscreen SPF 30+ to sun-exposed areas, reapply every 2 hours as needed.  - Recommend staying in the shade or wearing long sleeves, sun glasses (UVA+UVB protection) and wide brim hats (4-inch brim around the entire circumference of the hat). - Call for new or changing lesions.  Return in 4 weeks (on 03/04/2023) for Psoriasis F/U.  Documentation: I have reviewed the above documentation for accuracy and completeness, and I agree with the above.  Stasia Cavalier, am acting as scribe for Armida Sans, MD.  Armida Sans, MD Documentation: I have reviewed the above documentation for accuracy and completeness, and I agree with the above.  Armida Sans, MD

## 2023-02-04 NOTE — Patient Instructions (Addendum)

## 2023-02-05 ENCOUNTER — Telehealth: Payer: Self-pay

## 2023-02-05 DIAGNOSIS — R350 Frequency of micturition: Secondary | ICD-10-CM

## 2023-02-05 DIAGNOSIS — H02054 Trichiasis without entropian left upper eyelid: Secondary | ICD-10-CM | POA: Diagnosis not present

## 2023-02-05 MED ORDER — AMOXICILLIN 500 MG PO CAPS
500.0000 mg | ORAL_CAPSULE | Freq: Three times a day (TID) | ORAL | 0 refills | Status: AC
Start: 2023-02-05 — End: 2023-02-10

## 2023-02-05 NOTE — Telephone Encounter (Signed)
Patient calls nurse line for two reasons.   She is asking if Dr. Barb Merino has received surgical clearance paperwork from [facility]. She has to have a repair of a torn meniscus. Checked provider box and I was unable to find form. Please advise if you have received this or if [facility] needs to fax this paperwork again.  Patient is requesting an antibiotic for UTI. She reports increased urinary frequency however, is only "dribbling" when she goes to the bathroom. She denies dysuria, back pain,abdominal pain, fever or chills. Advised that we recommend her scheduling an appointment for further evaluation and to provide urine sample. She reports that she is caring for her husband and daughter who just had surgery. She states that it would be difficult to come into the office at this time.  Will forward to PCP for further advisement.   Veronda Prude, RN

## 2023-02-05 NOTE — Addendum Note (Signed)
Addended byBarb Merino,  on: 02/05/2023 04:42 PM   Modules accepted: Orders

## 2023-02-06 NOTE — Telephone Encounter (Signed)
Called Michelle [name]. They are faxing the paperwork again.   Called patient and informed of provider message.   Return precautions discussed with patient.   Patient appreciative.   Veronda Prude, RN

## 2023-02-12 ENCOUNTER — Encounter: Payer: Self-pay | Admitting: Dermatology

## 2023-02-18 ENCOUNTER — Telehealth: Payer: Self-pay

## 2023-02-18 NOTE — Telephone Encounter (Signed)
Spoke with patient . She stated she will just keep her Sept. 3Rd appointment. Aquilla Solian, CMA

## 2023-02-18 NOTE — Telephone Encounter (Signed)
-----   Message from [facility] sent at 02/14/2023  3:19 PM EDT ----- Elvina Sidle - I received a surgical clearance form for this patient from [facility]. Unfortunately it's been a few months since her last visit with [place] and because of her extensive medication list, we'll need some updated bloodwork including an A1c. She is scheduled for a visit with me on 9/3 - she is welcome to keep this appointment or schedule one sooner for the pre-op clearance.   Please give her a call when you have a chance to update her and see if she'd like to be seen sooner than 9/3.  Thanks Atif

## 2023-02-19 ENCOUNTER — Other Ambulatory Visit: Payer: Self-pay

## 2023-02-19 MED ORDER — OMEPRAZOLE 40 MG PO CPDR: 40.0000 mg | DELAYED_RELEASE_CAPSULE | Freq: Every morning | ORAL | 1 refills | Status: DC

## 2023-02-22 ENCOUNTER — Other Ambulatory Visit: Payer: Self-pay | Admitting: Dermatology

## 2023-02-22 DIAGNOSIS — T148XXA Other injury of unspecified body region, initial encounter: Secondary | ICD-10-CM

## 2023-02-24 DIAGNOSIS — M5416 Radiculopathy, lumbar region: Secondary | ICD-10-CM | POA: Diagnosis not present

## 2023-02-24 DIAGNOSIS — M47816 Spondylosis without myelopathy or radiculopathy, lumbar region: Secondary | ICD-10-CM | POA: Diagnosis not present

## 2023-02-26 DIAGNOSIS — J45909 Unspecified asthma, uncomplicated: Secondary | ICD-10-CM | POA: Diagnosis not present

## 2023-02-26 DIAGNOSIS — J4540 Moderate persistent asthma, uncomplicated: Secondary | ICD-10-CM | POA: Diagnosis not present

## 2023-02-26 DIAGNOSIS — I5020 Unspecified systolic (congestive) heart failure: Secondary | ICD-10-CM | POA: Diagnosis not present

## 2023-02-26 DIAGNOSIS — R918 Other nonspecific abnormal finding of lung field: Secondary | ICD-10-CM | POA: Diagnosis not present

## 2023-02-26 DIAGNOSIS — Z87891 Personal history of nicotine dependence: Secondary | ICD-10-CM | POA: Diagnosis not present

## 2023-02-26 DIAGNOSIS — Z7985 Long-term (current) use of injectable non-insulin antidiabetic drugs: Secondary | ICD-10-CM | POA: Diagnosis not present

## 2023-02-26 DIAGNOSIS — I110 Hypertensive heart disease with heart failure: Secondary | ICD-10-CM | POA: Diagnosis not present

## 2023-02-26 DIAGNOSIS — Z7951 Long term (current) use of inhaled steroids: Secondary | ICD-10-CM | POA: Diagnosis not present

## 2023-02-26 DIAGNOSIS — M069 Rheumatoid arthritis, unspecified: Secondary | ICD-10-CM | POA: Diagnosis not present

## 2023-02-26 DIAGNOSIS — E119 Type 2 diabetes mellitus without complications: Secondary | ICD-10-CM | POA: Diagnosis not present

## 2023-02-26 DIAGNOSIS — Z801 Family history of malignant neoplasm of trachea, bronchus and lung: Secondary | ICD-10-CM | POA: Diagnosis not present

## 2023-02-26 DIAGNOSIS — J45991 Cough variant asthma: Secondary | ICD-10-CM | POA: Diagnosis not present

## 2023-02-26 DIAGNOSIS — E785 Hyperlipidemia, unspecified: Secondary | ICD-10-CM | POA: Diagnosis not present

## 2023-02-26 DIAGNOSIS — Z7984 Long term (current) use of oral hypoglycemic drugs: Secondary | ICD-10-CM | POA: Diagnosis not present

## 2023-03-01 ENCOUNTER — Other Ambulatory Visit: Payer: Self-pay | Admitting: Dermatology

## 2023-03-01 DIAGNOSIS — L409 Psoriasis, unspecified: Secondary | ICD-10-CM

## 2023-03-03 NOTE — Progress Notes (Unsigned)
SUBJECTIVE:   CHIEF COMPLAINT / HPI:   Here for preop clearance for orthopedic surgery  T2DM -Current medication regimen: Trulicity***, metformin 500 mg*** -Last A1c: 6.6 in May 2024, today:*** -Home CBGs:*** -Denies polyuria, polydipsia, abdominal pain, chest pain, shortness of breath*** ***  PERTINENT  PMH / PSH: ***  Past Medical History:  Diagnosis Date   Actinic keratosis    Allergic rhinitis 08/28/2006   Anemia    low iron   Anginal pain (HCC)    Asthma, chronic 03/14/2010   Does seem to use her albuterol excessively. Consider change to more manageable medicines and treat more like COPD in the future.   Carpal tunnel syndrome, left 05/29/2009   Last Assessment & Plan:  Advised to wear wrist brace at night when she is sleeping.  If this does not help will consider having her go to sports medicine.   Chronic frontal sinusitis 08/28/2016   Chronic left maxillary sinusitis 08/28/2016   Diverticulosis    Essential hypertension 10/30/2012   Gastroparesis 11/19/2011   DM and chronic narcotics contribute.  Causes dysphagia like symptoms    Generalized anxiety disorder 08/28/2006   GERD (gastroesophageal reflux disease)    Hearing loss 07/19/2015   Hearing aid in right ear   History of cataracts, bilateral    Hyperlipidemia 08/28/2006   Lumbar radiculopathy 04/07/2018   Lung nodules    Osteoporosis 08/08/2011   Pain in joint, pelvic region and thigh 11/18/2012   Radial styloid tenosynovitis 08/15/2013   Restless leg syndrome 05/14/2012   Seropositive rheumatoid arthritis 01/08/2010   Patient is being followed by rheumatologist at [facility] patient is on Plaquenil and as well as prednisone.   Changed to methotrexate and prednisone in May of 2013  Patient does have a pain contract here with [facility] receives oxycodone 10/325 mg tabs every 6 hours. This has been titrated down from significant doses of methadone previously. Could attempt to  tit   Shoulder joint pain 03/25/2011   The patient is compensating due to her not being able to use her left arm right now rotator cuff impingement if he continues to worsen would not be surprised if she does get a tear on this side as well   Squamous cell carcinoma in situ of skin 11/25/2014   Referred to dermatology for excision    Squamous cell carcinoma of skin 12/26/2014   Right distal pretibial. SCC-KA pattern.   Squamous cell carcinoma of skin 03/27/2015   Right inf. lat. knee. SCC-KA pattern.    Squamous cell carcinoma of skin 10/07/2016   Left lateral calf superior. WD SCC.   Squamous cell carcinoma of skin 10/07/2016   Left lateral calf inferior. WD SCC.   Squamous cell carcinoma of skin 04/28/2018   Left mid med. pretibial. WD SCC.   Squamous cell carcinoma of skin 08/12/2019   Left mid med. pretibial sup. KA type. EDC   Squamous cell carcinoma of skin 08/12/2019   Left mid med. pretibial inferior. WD. EDC.   Tear of medial meniscus of knee, left 04/27/2009   Type II diabetes mellitus 08/28/2006   Overview:  Patient has her ups and downs, go in line with flares of her RA and prednisone uses.  Patient does have hx of hypoglycemic events so would not try for perfect control but should have A1c goal around 7.0 Lab Results  Component Value Date   HGBA1C 6.9 12/27/2011   Last Assessment & Plan:  At goal. Will continue  current metformin    OBJECTIVE:  There were no vitals taken for this visit.  General: NAD, pleasant, able to participate in exam Cardiac: RRR, no murmurs auscultated Respiratory: CTAB, normal WOB Abdomen: soft, non-tender, non-distended, normoactive bowel sounds Extremities: warm and well perfused, no edema or cyanosis Skin: warm and dry, no rashes noted Neuro: alert, no obvious focal deficits, speech normal Psych: Normal affect and mood  ASSESSMENT/PLAN:   There are no diagnoses linked to this encounter. No orders of the defined types were placed in this  encounter.  No follow-ups on file.  Vonna Drafts, MD [facility]

## 2023-03-04 ENCOUNTER — Ambulatory Visit (INDEPENDENT_AMBULATORY_CARE_PROVIDER_SITE_OTHER): Payer: 59 | Admitting: Family Medicine

## 2023-03-04 ENCOUNTER — Encounter: Payer: Self-pay | Admitting: Family Medicine

## 2023-03-04 ENCOUNTER — Other Ambulatory Visit: Payer: Self-pay

## 2023-03-04 VITALS — BP 107/69 | HR 76 | Ht 62.0 in | Wt 130.4 lb

## 2023-03-04 DIAGNOSIS — Z01818 Encounter for other preprocedural examination: Secondary | ICD-10-CM

## 2023-03-04 DIAGNOSIS — E1142 Type 2 diabetes mellitus with diabetic polyneuropathy: Secondary | ICD-10-CM | POA: Diagnosis not present

## 2023-03-04 DIAGNOSIS — G8929 Other chronic pain: Secondary | ICD-10-CM

## 2023-03-04 DIAGNOSIS — M059 Rheumatoid arthritis with rheumatoid factor, unspecified: Secondary | ICD-10-CM

## 2023-03-04 DIAGNOSIS — J45909 Unspecified asthma, uncomplicated: Secondary | ICD-10-CM | POA: Diagnosis not present

## 2023-03-04 LAB — POCT GLYCOSYLATED HEMOGLOBIN (HGB A1C): HbA1c, POC (controlled diabetic range): 7.6 % — AB (ref 0.0–7.0)

## 2023-03-04 MED ORDER — TRULICITY 3 MG/0.5ML ~~LOC~~ SOAJ
3.0000 mg | SUBCUTANEOUS | 2 refills | Status: DC
Start: 2023-03-04 — End: 2023-05-29

## 2023-03-04 MED ORDER — FLUTICASONE PROPIONATE 50 MCG/ACT NA SUSP: 2.0000 | Freq: Every day | NASAL | 1 refills | Status: DC | PRN

## 2023-03-04 MED ORDER — OXYCODONE-ACETAMINOPHEN 10-325 MG PO TABS
1.0000 | ORAL_TABLET | Freq: Four times a day (QID) | ORAL | 0 refills | Status: DC | PRN
Start: 2023-03-04 — End: 2023-03-26

## 2023-03-04 NOTE — Assessment & Plan Note (Deleted)
  1. Type 2 diabetes mellitus with diabetic polyneuropathy, without long-term current use of insulin (HCC) - HgB A1c

## 2023-03-04 NOTE — Patient Instructions (Addendum)
Please do not take your aspirin on the morning of your surgery.  Assuming there are no complications, you may take it after your surgery  Otherwise, you can take all of your current medications as prescribed  Your A1c today is 7.6 which is higher than before.  However, since you are on steroids for over a month during this time period, we can check it again in 3 months.  For now, continue your Trulicity and metformin as prescribed.  I will let you know if your urine results from today are abnormal.  I have sent in refills to your pharmacy.

## 2023-03-04 NOTE — Assessment & Plan Note (Deleted)
  1. Type 2 diabetes mellitus with diabetic polyneuropathy, without long-term current use of insulin (HCC) - HgB A1c  2. Encounter for chronic pain management  3. Seropositive rheumatoid arthritis

## 2023-03-05 ENCOUNTER — Other Ambulatory Visit: Payer: Self-pay | Admitting: Hematology and Oncology

## 2023-03-05 ENCOUNTER — Inpatient Hospital Stay: Payer: 59 | Attending: Hematology and Oncology

## 2023-03-05 DIAGNOSIS — D509 Iron deficiency anemia, unspecified: Secondary | ICD-10-CM | POA: Insufficient documentation

## 2023-03-05 DIAGNOSIS — D500 Iron deficiency anemia secondary to blood loss (chronic): Secondary | ICD-10-CM

## 2023-03-05 LAB — RETIC PANEL
Immature Retic Fract: 13.8 % (ref 2.3–15.9)
RBC.: 3.69 MIL/uL — ABNORMAL LOW (ref 3.87–5.11)
Retic Count, Absolute: 65.3 10*3/uL (ref 19.0–186.0)
Retic Ct Pct: 1.8 % (ref 0.4–3.1)
Reticulocyte Hemoglobin: 35.4 pg (ref >27.9)

## 2023-03-05 LAB — CBC WITH DIFFERENTIAL (CANCER CENTER ONLY)
Abs Immature Granulocytes: 0.02 10*3/uL (ref 0.00–0.07)
Basophils Absolute: 0.1 10*3/uL (ref 0.0–0.1)
Basophils Relative: 1 %
Eosinophils Absolute: 0.1 10*3/uL (ref 0.0–0.5)
Eosinophils Relative: 1 %
HCT: 37.3 % (ref 36.0–46.0)
Hemoglobin: 12.1 g/dL (ref 12.0–15.0)
Immature Granulocytes: 0 %
Lymphocytes Relative: 25 %
Lymphs Abs: 2.1 10*3/uL (ref 0.7–4.0)
MCH: 32.9 pg (ref 26.0–34.0)
MCHC: 32.4 g/dL (ref 30.0–36.0)
MCV: 101.4 fL — ABNORMAL HIGH (ref 80.0–100.0)
Monocytes Absolute: 0.7 10*3/uL (ref 0.1–1.0)
Monocytes Relative: 9 %
Neutro Abs: 5.5 10*3/uL (ref 1.7–7.7)
Neutrophils Relative %: 64 %
Platelet Count: 211 10*3/uL (ref 150–400)
RBC: 3.68 MIL/uL — ABNORMAL LOW (ref 3.87–5.11)
RDW: 12.2 % (ref 11.5–15.5)
WBC Count: 8.5 10*3/uL (ref 4.0–10.5)
nRBC: 0 % (ref 0.0–0.2)

## 2023-03-05 LAB — FERRITIN: Ferritin: 150 ng/mL (ref 11–307)

## 2023-03-05 LAB — MICROALBUMIN / CREATININE URINE RATIO
Creatinine, Urine: 100.6 mg/dL
Microalb/Creat Ratio: 18 mg/g{creat} (ref 0–29)
Microalbumin, Urine: 18.5 ug/mL

## 2023-03-05 LAB — CMP (CANCER CENTER ONLY)
ALT: 12 U/L (ref 0–44)
AST: 15 U/L (ref 15–41)
Albumin: 4.2 g/dL (ref 3.5–5.0)
Alkaline Phosphatase: 55 U/L (ref 38–126)
Anion gap: 7 (ref 5–15)
BUN: 11 mg/dL (ref 8–23)
CO2: 29 mmol/L (ref 22–32)
Calcium: 9.4 mg/dL (ref 8.9–10.3)
Chloride: 103 mmol/L (ref 98–111)
Creatinine: 0.55 mg/dL (ref 0.44–1.00)
GFR, Estimated: >60 mL/min (ref >60)
Glucose, Bld: 133 mg/dL — ABNORMAL HIGH (ref 70–99)
Potassium: 4.5 mmol/L (ref 3.5–5.1)
Sodium: 139 mmol/L (ref 135–145)
Total Bilirubin: 0.5 mg/dL (ref 0.3–1.2)
Total Protein: 6.6 g/dL (ref 6.5–8.1)

## 2023-03-05 LAB — IRON AND IRON BINDING CAPACITY (CC-WL,HP ONLY)
Iron: 115 ug/dL (ref 28–170)
Saturation Ratios: 31 % (ref 10.4–31.8)
TIBC: 367 ug/dL (ref 250–450)
UIBC: 252 ug/dL (ref 148–442)

## 2023-03-12 ENCOUNTER — Telehealth: Payer: Self-pay | Admitting: *Deleted

## 2023-03-12 NOTE — Progress Notes (Signed)
  Care Coordination   Note   03/12/2023 Name: Michelle [name] MRN: [id number] DOB: [birth year]  Michelle [name] is a 72 y.o. year old female who sees Vonna Drafts, MD for primary care. I reached out to [name] by phone today to offer care coordination services.  Ms. Arrue was given information about Care Coordination services today including:   The Care Coordination services include support from the care team which includes your Nurse Coordinator, Clinical Social Worker, or Pharmacist.  The Care Coordination team is here to help remove barriers to the health concerns and goals most important to you. Care Coordination services are voluntary, and the patient may decline or stop services at any time by request to their care team member.   Care Coordination Consent Status: Patient agreed to services and verbal consent obtained.   Follow up plan:  Telephone appointment with care coordination team member scheduled for:  03/19/23  Encounter Outcome:  Patient Scheduled  [facility] Coordination Care Guide  Direct Dial: [phone]

## 2023-03-13 ENCOUNTER — Encounter: Payer: Self-pay | Admitting: Dermatology

## 2023-03-13 ENCOUNTER — Ambulatory Visit (INDEPENDENT_AMBULATORY_CARE_PROVIDER_SITE_OTHER): Payer: 59 | Admitting: Dermatology

## 2023-03-13 VITALS — BP 111/70

## 2023-03-13 DIAGNOSIS — Z79899 Other long term (current) drug therapy: Secondary | ICD-10-CM | POA: Diagnosis not present

## 2023-03-13 DIAGNOSIS — L409 Psoriasis, unspecified: Secondary | ICD-10-CM

## 2023-03-13 DIAGNOSIS — Z7189 Other specified counseling: Secondary | ICD-10-CM

## 2023-03-13 NOTE — Progress Notes (Signed)
   Follow-Up Visit   Subjective  Michelle [name] is a 72 y.o. female who presents for the following: Psoriasis scalp 44m f/u, pt did not start the [place] due to hx of heart failure,she wants to talk to her heart doctor before starting, using Clobetasol sol ~3x/wk The patient has spots, moles and lesions to be evaluated, some may be new or changing and the patient may have concern these could be cancer.   The following portions of the chart were reviewed this encounter and updated as appropriate: medications, allergies, medical history  Review of Systems:  No other skin or systemic complaints except as noted in HPI or Assessment and Plan.  Objective  Well appearing patient in no apparent distress; mood and affect are within normal limits.   A focused examination was performed of the following areas: scalp  Relevant exam findings are noted in the Assessment and Plan.    Assessment & Plan   PSORIASIS scalp Exam: mild scale on scalp 7% BSA.  Chronic and persistent condition with duration or expected duration over one year. Condition is symptomatic/ bothersome to patient. Not currently at goal.   patient denies joint pain  Psoriasis is a chronic non-curable, but treatable genetic/hereditary disease that may have other systemic features affecting other organ systems such as joints (Psoriatic Arthritis). It is associated with an increased risk of inflammatory bowel disease, heart disease, non-alcoholic fatty liver disease, and depression.  Treatments include light and laser treatments; topical medications; and systemic medications including oral and injectables.  Treatment Plan: Pending approval from her cardiologist she will start Otezla 30mg  1 po qd, discussed  titrating up, advised pt to call or send mychart message to let us know if she is starting [place], if she starts we need to schedule her for a month f/u Cont Clobetasol sol qd up to 5d/wk aa scalp prn flares, avoid  f/g/a  Topical steroids (such as triamcinolone, fluocinolone, fluocinonide, mometasone, clobetasol, halobetasol, betamethasone, hydrocortisone) can cause thinning and lightening of the skin if they are used for too long in the same area. Your physician has selected the right strength medicine for your problem and area affected on the body. Please use your medication only as directed by your physician to prevent side effects.    Side effects of Otezla (apremilast) include diarrhea, nausea, headache, upper respiratory infection, depression, and weight decrease (5-10%). It should only be taken by pregnant women after a discussion regarding risks and benefits with their doctor. Goal is control of skin condition, not cure.  The use of [place] requires long term medication management, including periodic office visits.   Long term medication management.  Patient is using long term (months to years) prescription medication  to control their dermatologic condition.  These medications require periodic monitoring to evaluate for efficacy and side effects and may require periodic laboratory monitoring.    Return in about 6 months (around 09/10/2023) for Psoriasis f/u.  I, Ardis Rowan, RMA, am acting as scribe for Armida Sans, MD .   Documentation: I have reviewed the above documentation for accuracy and completeness, and I agree with the above.  Armida Sans, MD

## 2023-03-13 NOTE — Patient Instructions (Signed)

## 2023-03-13 NOTE — Telephone Encounter (Signed)
This encounter was created in error - please disregard.

## 2023-03-15 ENCOUNTER — Encounter: Payer: Self-pay | Admitting: Dermatology

## 2023-03-19 ENCOUNTER — Ambulatory Visit (INDEPENDENT_AMBULATORY_CARE_PROVIDER_SITE_OTHER): Payer: 59 | Admitting: Podiatry

## 2023-03-19 ENCOUNTER — Ambulatory Visit: Payer: Self-pay

## 2023-03-19 ENCOUNTER — Encounter: Payer: Self-pay | Admitting: Podiatry

## 2023-03-19 DIAGNOSIS — E1151 Type 2 diabetes mellitus with diabetic peripheral angiopathy without gangrene: Secondary | ICD-10-CM | POA: Diagnosis not present

## 2023-03-19 DIAGNOSIS — B351 Tinea unguium: Secondary | ICD-10-CM | POA: Diagnosis not present

## 2023-03-19 DIAGNOSIS — M79674 Pain in right toe(s): Secondary | ICD-10-CM | POA: Diagnosis not present

## 2023-03-19 DIAGNOSIS — M79675 Pain in left toe(s): Secondary | ICD-10-CM | POA: Diagnosis not present

## 2023-03-19 NOTE — Progress Notes (Signed)
Subjective:  Patient ID: Michelle [name], female    DOB: 09/16/1950,  MRN: [id number]  SHANTY SICA presents to clinic today for at risk foot care. Pt has h/o NIDDM with PAD and painful thick toenails that are difficult to trim. Pain interferes with ambulation. Aggravating factors include wearing enclosed shoe gear. Pain is relieved with periodic professional debridement.   New problem(s): None.   PCP is Vonna Drafts, MD.  Allergies  Allergen Reactions   Zofran [Ondansetron Hcl] Other (See Comments)    Prolong QT   Ace Inhibitors Cough    Occurred with lisinopril   Levofloxacin Other (See Comments)    Felt things on her legs that were not there,made her stomach hurt   Lisinopril Cough   Sulfonamide Derivatives Hives and Itching   Sulfa Antibiotics     Reaction:  Other reaction(s): ITCHING    Review of Systems: Negative except as noted in the HPI.  Objective: No changes noted in today's physical examination. There were no vitals filed for this visit. Michelle [name] is a pleasant 72 y.o. female WD, WN in NAD. AAO x 3.  Vascular Examination: CFT <3 seconds b/l. DP/PT right foot faintly palpable.  DP palpable RLE; nonpalpable LLE. PT pulse palpable RLE,  nonpalpable LLE. Skin temperature gradient warm to warm b/l. No pain with calf compression. No ischemia or gangrene. No cyanosis or clubbing noted b/l.    Neurological Examination: Sensation grossly intact b/l with 10 gram monofilament. Vibratory sensation intact b/l. Pt has subjective symptoms of neuropathy.  Dermatological Examination: Pedal skin warm and supple b/l.   No open wounds. No interdigital macerations.  Toenails 1-5 b/l thick, discolored, elongated with subungual debris and pain on dorsal palpation.    No corns, calluses nor porokeratotic lesions noted.  Musculoskeletal Examination: Muscle strength 5/5 to all lower extremity muscle groups bilaterally. HAV with bunion deformity noted b/l LE. Hammertoe(s)  noted to the bilateral 3rd toes.  Radiographs: None  Last A1c:      Latest Ref Rng & Units 03/04/2023    8:34 AM 11/26/2022   10:54 AM 08/26/2022    8:28 AM 05/28/2022   10:02 AM  Hemoglobin A1C  Hemoglobin-A1c 0.0 - 7.0 % 7.6  6.6  8.0  6.1    Assessment/Plan: 1. Pain due to onychomycosis of toenails of both feet   2. Diabetes mellitus with peripheral circulatory disorder [facility])     -Consent given for treatment as described below: -Examined patient. -Continue supportive shoe gear daily. -Mycotic toenails 1-5 bilaterally were debrided in length and girth with sterile nail nippers and dremel without incident. -Patient/POA to call should there be question/concern in the interim.   Return in about 3 months (around 06/18/2023).  Freddie Breech, DPM

## 2023-03-19 NOTE — Patient Instructions (Signed)
Visit Information  Thank you for taking time to visit with me today. Please don't hesitate to contact me if I can be of assistance to you.   Following are the goals we discussed today:   Goals Addressed               This Visit's Progress     Diabetes Patient stated goal- lower my A1C (pt-stated)        Patient Goals/Self Care Activities: -Patient/Caregiver will take medications as prescribed   -Patient/Caregiver will attend all scheduled provider appointments -Patient/Caregiver will call pharmacy for medication refills 3-7 days in advance of running out of medications -Patient/Caregiver will call provider office for new concerns or questions  -Patient/Caregiver will focus on medication adherence by taking medications as prescribed   Objective:  Lab Results  Component Value Date   HGBA1C 7.6 (A) 03/04/2023   Lab Results  Component Value Date   CREATININE 0.55 03/05/2023   CREATININE 0.56 11/26/2022   CREATININE 0.42 (L) 08/29/2022   Lab Results  Component Value Date   EGFR 102 08/16/2022    adherence to prescribed medication regimen       contacting provider for new or worsened symptoms or questions      Provided education to patient about basic DM disease process Reviewed medications with patient and discussed importance of medication adherence Discussed plans with patient for ongoing care management follow up and provided patient with direct contact information for care management team Review of patient status, including review of consultants reports, relevant laboratory and other test results, and medications completed.          Our next appointment is by telephone on 04/10/23 at 1030 am  Please call the care guide team at [phone] if you need to cancel or reschedule your appointment.   If you are experiencing a Mental Health or Behavioral Health Crisis or need someone to talk to, please call 1-800-273-TALK (toll free, 24 hour hotline)  Patient verbalizes  understanding of instructions and care plan provided today and agrees to view in MyChart. Active MyChart status and patient understanding of how to access instructions and care plan via MyChart confirmed with patient.     Juanell Fairly RN, BSN, [facility] [employer] Phone: [phone]

## 2023-03-19 NOTE — Patient Outreach (Signed)
Care Coordination   Initial Visit Note   03/19/2023 Name: Michelle [name] MRN: [id number] DOB: 1950-10-16  Michelle [name] is a 72 y.o. year old female who sees Vonna Drafts, MD for primary care. I spoke with  Michelle [name] by phone today.  What matters to the patients health and wellness today?  I have had a discussion with Michelle [name] regarding her interest in reducing her A1C levels. She indicated that she does not regularly monitor her blood sugar levels but has committed to beginning this practice. I will be providing her with relevant informational materials for review, and we can discuss this topic during our next interaction.    Goals Addressed               This Visit's Progress     Diabetes Patient stated goal- lower my A1C (pt-stated)        Patient Goals/Self Care Activities: -Patient/Caregiver will take medications as prescribed   -Patient/Caregiver will attend all scheduled provider appointments -Patient/Caregiver will call pharmacy for medication refills 3-7 days in advance of running out of medications -Patient/Caregiver will call provider office for new concerns or questions  -Patient/Caregiver will focus on medication adherence by taking medications as prescribed   Objective:  Lab Results  Component Value Date   HGBA1C 7.6 (A) 03/04/2023   Lab Results  Component Value Date   CREATININE 0.55 03/05/2023   CREATININE 0.56 11/26/2022   CREATININE 0.42 (L) 08/29/2022   Lab Results  Component Value Date   EGFR 102 08/16/2022    adherence to prescribed medication regimen       contacting provider for new or worsened symptoms or questions      Provided education to patient about basic DM disease process Reviewed medications with patient and discussed importance of medication adherence Discussed plans with patient for ongoing care management follow up and provided patient with direct contact information for care management team Review of patient status,  including review of consultants reports, relevant laboratory and other test results, and medications completed.          SDOH assessments and interventions completed:  Yes  SDOH Interventions Today    Flowsheet Row Most Recent Value  SDOH Interventions   Food Insecurity Interventions Intervention Not Indicated  Transportation Interventions Intervention Not Indicated        Care Coordination Interventions:  Yes, provided   Follow up plan: Follow up call scheduled for 04/10/23  1030 am    Encounter Outcome:  Patient Visit Completed   Juanell Fairly RN, BSN, [facility]   Care Coordinator Phone: [phone]

## 2023-03-25 ENCOUNTER — Encounter (HOSPITAL_COMMUNITY): Payer: Self-pay | Admitting: Orthopaedic Surgery

## 2023-03-25 ENCOUNTER — Other Ambulatory Visit: Payer: Self-pay

## 2023-03-25 NOTE — Discharge Instructions (Addendum)
Ramond Marrow MD, MPH Alfonse Alpers, PA-C [facility] [address] [phone] (tel)   [phone] (fax)   POST-OPERATIVE INSTRUCTIONS - Knee Arthroscopy  WOUND CARE - You may remove the Operative Dressing on Post-Op Day #3 (72hrs after surgery).   -  Alternatively if you would like you can leave dressing on until follow-up if within 7-8 days but keep it dry. - Leave steri-strips in place until they fall off on their own, usually 2 weeks postop. - An ACE wrap may be used to control swelling, do not wrap this too tight.  If the initial ACE wrap feels too tight you may loosen it. - There may be a small amount of fluid/bleeding leaking at the surgical site.  - This is normal; the knee is filled with fluid during the procedure and can leak for 24-48hrs after surgery. You may change/reinforce the bandage as needed.  - Use the Cryocuff or Ice as often as possible for the first 7 days, then as needed for pain relief. Always keep a towel, ACE wrap or other barrier between the cooling unit and your skin.  - You may shower on Post-Op Day #3. Gently pat the area dry.  - Do not soak the knee in water or submerge it.  - Do not go swimming in the pool or ocean until 4 weeks after surgery or when otherwise instructed.  Keep dry incisions as dry as possible.   BRACE/AMBULATION  -            You will not need a brace after this procedure.   - You may use crutches initially to help you weight bear, but this is not required - You can put full weight on your operative leg as you feel comfortable  PHYSICAL THERAPY - You will begin physical therapy soon after surgery (unless otherwise specified) - Please call to set up an appointment, if you do not already have one  - Let our office if there are any issues with scheduling your therapy  - You have a physical therapy appointment scheduled at [facility] (across the hall from our office) on 9/30   REGIONAL ANESTHESIA (NERVE  BLOCKS) The anesthesia team may have performed a nerve block for you this is a great tool used to minimize pain.   The block may start wearing off overnight (between 8-24 hours postop) When the block wears off, your pain may go from nearly zero to the pain you would have had postop without the block. This is an abrupt transition but nothing dangerous is happening.   This can be a challenging period but utilize your as needed pain medications to try and manage this period. We suggest you use the pain medication the first night prior to going to bed, to ease this transition.  You may take an extra dose of narcotic when this happens if needed   POST-OP MEDICATIONS- Multimodal approach to pain control In general your pain will be controlled with a combination of substances.  Prescriptions unless otherwise discussed are electronically sent to your pharmacy.  This is a carefully made plan we use to minimize narcotic use.     Meloxicam - Anti-inflammatory medication taken on a scheduled basis Acetaminophen - Non-narcotic pain medicine taken on a scheduled basis  Tramadol - This is a strong narcotic, to be used only on an "as needed" basis for SEVERE pain. Aspirin 81mg  - This medicine is used to minimize the risk of blood clots after  surgery. Omeprazole - daily medicine to protect your stomach while taking anti-inflammatories.    FOLLOW-UP   Please call the office to schedule a follow-up appointment for your incision check, 7-10 days post-operatively.   IF YOU HAVE ANY QUESTIONS, PLEASE FEEL FREE TO CALL OUR OFFICE.   HELPFUL INFORMATION   Keep your leg elevated to decrease swelling, which will then in turn decrease your pain. I would elevate the foot of your bed by putting a couple of couch pillows between your mattress and box spring. I would not keep pillow directly under your ankle.  - Do not sleep with a pillow behind your knee even if it is more comfortable as this may make it harder  to get your knee fully straight long term.   There will be MORE swelling on days 1-3 than there is on the day of surgery.  This also is normal. The swelling will decrease with the anti-inflammatory medication, ice and keeping it elevated. The swelling will make it more difficult to bend your knee. As the swelling goes down your motion will become easier   You may develop swelling and bruising that extends from your knee down to your calf and perhaps even to your foot over the next week. Do not be alarmed. This too is normal, and it is due to gravity   There may be some numbness adjacent to the incision site. This may last for 6-12 months or longer in some patients and is expected.   You may return to sedentary work/school in the next couple of days when you feel up to it. You will need to keep your leg elevated as much as possible    You should wean off your narcotic medicines as soon as you are able.  Most patients will be off narcotics before their first postop appointment.    We suggest you use the pain medication the first night prior to going to bed, in order to ease any pain when the anesthesia wears off. You should avoid taking pain medications on an empty stomach as it will make you nauseous.   Do not drink alcoholic beverages or take illicit drugs when taking pain medications.   It is against the law to drive while taking narcotics. You cannot drive if your Right leg is in brace locked in extension.   Pain medication may make you constipated.  Below are a few solutions to try in this order:  o Decrease the amount of pain medication if you aren't having pain.  o Drink lots of decaffeinated fluids.  o Drink prune juice and/or eat dried prunes   o If the first 3 don't work start with additional solutions  o Take Colace - an over-the-counter stool softener  o Take Senokot - an over-the-counter laxative  o Take Miralax - a stronger over-the-counter laxative    For more  information including helpful videos and documents visit our website:   [email or web address]

## 2023-03-25 NOTE — Progress Notes (Addendum)
  For Anesthesia: PCP - Vonna Drafts, MD  last office note and surgical clearance  on 03/04/23 in [facility]  Cardiologist - Tessa Lerner, DO last office visit note 11/22/22 in [facility]  Neurologist- Allena Katz, Noberto Retort, DO/Wertman, Sung Amabile, PA-C  Oncologist- Jaci Standard, MD    Chest x-ray - 05/29/22 in [facility] EKG - 11/22/22 in [facility] Stress Test - Yes ECHO - 07/14/22 in [facility] Cardiac Cath - 07/30/22 Pacemaker/ICD device last checked: Pacemaker orders received: Device Rep notified:   Spinal Cord Stimulator:  N/A   Sleep Study - 01/16/2017 in [facility] CPAP - N/A   Fasting Blood Sugar - Under 200 Checks Blood Sugar - 1 times a day Date and result of last Hgb A1c- 7.6 on 03/04/23 in [facility] (patient reports that she was on a 35-day course of prednisone during this time. )   Last dose of GLP1 agonist- Trulicity, last dose 03-19-23 GLP1 instructions: Hold until after surgery    Last dose of SGLT-2 inhibitors-  SGLT-2 instructions:   Blood Thinner Instructions: Aspirin Instructions: Per note 03/04/23 Advised to hold aspirin on the morning of the surgery.  Last Dose: 03-24-23   Activity level: Can go up a flight of stairs and activities of daily living without stopping and without chest pain and/or shortness of breath.                         Anesthesia review: compensated nonischemic cardiomyopathy, chronic congestive heart failure, coronary artery calcification, aortic atherosclerosis, former smoker, non-insulin-dependent diabetes mellitus type 2, hyperlipidemia, hypertension.    Patient denies shortness of breath, fever, cough and chest pain during pre op phone call.    Patient verbalized understanding of instructions reviewed via telephone.

## 2023-03-25 NOTE — H&P (Signed)
PREOPERATIVE H&P  Chief Complaint: RIGHT KNEE MEDIAL MENISCUS TEAR, SYNOVITITS  HPI: Michelle [name] is a 72 y.o. female who is scheduled for, Procedure(s): KNEE ARTHROSCOPY WITH MEDIAL MENISECTOMY SYNOVECTOMY.   Patient has a past medical history significant for Htn, asthma, GERD, HLD, RA, DM.   The patient is a 72 year old who has had a history of one year of worsening right knee pain. She has failed injections. She has a sense of instability. She has had multiple falls.  Symptoms are rated as moderate to severe, and have been worsening.  This is significantly impairing activities of daily living.    Please see clinic note for further details on this patient's care.    She has elected for surgical management.   Past Medical History:  Diagnosis Date   Actinic keratosis    Allergic rhinitis 08/28/2006   Anemia    low iron   Anginal pain (HCC)    Asthma, chronic 03/14/2010   Does seem to use her albuterol excessively. Consider change to more manageable medicines and treat more like COPD in the future.   Carpal tunnel syndrome, left 05/29/2009   Last Assessment & Plan:  Advised to wear wrist brace at night when she is sleeping.  If this does not help will consider having her go to sports medicine.   Chronic frontal sinusitis 08/28/2016   Chronic left maxillary sinusitis 08/28/2016   Diverticulosis    Essential hypertension 10/30/2012   Gastroparesis 11/19/2011   DM and chronic narcotics contribute.  Causes dysphagia like symptoms    Generalized anxiety disorder 08/28/2006   GERD (gastroesophageal reflux disease)    Hearing loss 07/19/2015   Hearing aid in right ear   History of cataracts, bilateral    Hyperlipidemia 08/28/2006   Lumbar radiculopathy 04/07/2018   Lung nodules    Osteoporosis 08/08/2011   Pain in joint, pelvic region and thigh 11/18/2012   Radial styloid tenosynovitis 08/15/2013   Restless leg syndrome 05/14/2012   Seropositive rheumatoid arthritis  01/08/2010   Patient is being followed by rheumatologist at [facility] patient is on Plaquenil and as well as prednisone.   Changed to methotrexate and prednisone in May of 2013  Patient does have a pain contract here with [facility] receives oxycodone 10/325 mg tabs every 6 hours. This has been titrated down from significant doses of methadone previously. Could attempt to tit   Shoulder joint pain 03/25/2011   The patient is compensating due to her not being able to use her left arm right now rotator cuff impingement if he continues to worsen would not be surprised if she does get a tear on this side as well   Squamous cell carcinoma in situ of skin 11/25/2014   Referred to dermatology for excision    Squamous cell carcinoma of skin 12/26/2014   Right distal pretibial. SCC-KA pattern.   Squamous cell carcinoma of skin 03/27/2015   Right inf. lat. knee. SCC-KA pattern.    Squamous cell carcinoma of skin 10/07/2016   Left lateral calf superior. WD SCC.   Squamous cell carcinoma of skin 10/07/2016   Left lateral calf inferior. WD SCC.   Squamous cell carcinoma of skin 04/28/2018   Left mid med. pretibial. WD SCC.   Squamous cell carcinoma of skin 08/12/2019   Left mid med. pretibial sup. KA type. EDC   Squamous cell carcinoma of skin 08/12/2019   Left mid med. pretibial inferior. WD. EDC.  Tear of medial meniscus of knee, left 04/27/2009   Type II diabetes mellitus 08/28/2006   Overview:  Patient has her ups and downs, go in line with flares of her RA and prednisone uses.  Patient does have hx of hypoglycemic events so would not try for perfect control but should have A1c goal around 7.0 Lab Results  Component Value Date   HGBA1C 6.9 12/27/2011   Last Assessment & Plan:  At goal. Will continue current metformin   Past Surgical History:  Procedure Laterality Date   ABDOMINAL HYSTERECTOMY     APPENDECTOMY     BACK SURGERY     BIOPSY  05/10/2021   Procedure:  BIOPSY;  Surgeon: Rachael Fee, MD;  Location: [facility] ENDOSCOPY;  Service: Endoscopy;;  EGD and COLON   CARPAL TUNNEL RELEASE Right    CATARACT EXTRACTION Left    with lid lift   CHOLECYSTECTOMY     COLONOSCOPY     COLONOSCOPY WITH PROPOFOL N/A 05/10/2021   Procedure: COLONOSCOPY WITH PROPOFOL;  Surgeon: Rachael Fee, MD;  Location: [facility];  Service: Endoscopy;  Laterality: N/A;   ESOPHAGOGASTRODUODENOSCOPY (EGD) WITH PROPOFOL N/A 05/10/2021   Procedure: ESOPHAGOGASTRODUODENOSCOPY (EGD) WITH PROPOFOL;  Surgeon: Rachael Fee, MD;  Location: [facility];  Service: Endoscopy;  Laterality: N/A;   HERNIA REPAIR     KNEE SURGERY Right    x2- cartilage annd scar tissue   LEFT AND RIGHT HEART CATHETERIZATION WITH CORONARY ANGIOGRAM N/A 08/17/2013   Procedure: LEFT AND RIGHT HEART CATHETERIZATION WITH CORONARY ANGIOGRAM;  Surgeon: Pamella Pert, MD;  Location: [facility] CATH LAB;  Service: Cardiovascular;  Laterality: N/A;   lumbar back surgery  04/04/2018   done by neurosurgeron   right foot bone spurs removed     RIGHT/LEFT HEART CATH AND CORONARY ANGIOGRAPHY N/A 07/30/2022   Procedure: RIGHT/LEFT HEART CATH AND CORONARY ANGIOGRAPHY;  Surgeon: Elder Negus, MD;  Location: [facility] INVASIVE CV LAB;  Service: Cardiovascular;  Laterality: N/A;   ROTATOR CUFF REPAIR Right    SHOULDER SURGERY Left    x2- rotatar cuff tear   UPPER GASTROINTESTINAL ENDOSCOPY     Social History   Socioeconomic History   Marital status: Married    Spouse name: Warehouse manager   Number of children: 3   Years of education: 9   Highest education level: 9th grade  Occupational History   Occupation: Retired/disabled    Associate Professor: UNEMPLOYED  Tobacco Use   Smoking status: Former    Current packs/day: 0.00    Average packs/day: 1.5 packs/day for 5.0 years (7.5 ttl pk-yrs)    Types: Cigarettes    Start date: 05/31/1989    Quit date: 05/31/1994    Years since quitting: 28.8    Passive exposure: Past    Smokeless tobacco: Never  Vaping Use   Vaping status: Never Used  Substance and Sexual Activity   Alcohol use: No    Alcohol/week: 0.0 standard drinks of alcohol   Drug use: No   Sexual activity: Not on file  Other Topics Concern   Not on file  Social History Narrative   Pt is on [employer] program [place]   Lives with husband and granddgt.   Lots of family stressors including special-needs grandaughter who lives with them.      Advance Directives: None - Patient's plan is that her husband will make decisions for her if she were not capable of making informed decisions for herself. (Discussion with Tawanna Cooler McDiarmid, MD 12/11/18)  Full Code: Desires CPR/ACLS  (Discussion with Tawanna Cooler McDiarmid, MD 12/11/18)         Social Determinants of Health   Financial Resource Strain: Low Risk  (09/13/2022)   Overall Financial Resource Strain (CARDIA)    Difficulty of Paying Living Expenses: Not hard at all  Food Insecurity: No Food Insecurity (09/13/2022)   Hunger Vital Sign    Worried About Running Out of Food in the Last Year: Never true    Ran Out of Food in the Last Year: Never true  Transportation Needs: No Transportation Needs (09/13/2022)   PRAPARE - Administrator, Civil Service (Medical): No    Lack of Transportation (Non-Medical): No  Physical Activity: Inactive (09/13/2022)   Exercise Vital Sign    Days of Exercise per Week: 0 days    Minutes of Exercise per Session: 0 min  Stress: No Stress Concern Present (09/13/2022)   [employer] of Occupational Health - Occupational Stress Questionnaire    Feeling of Stress : Not at all  Social Connections: Moderately Isolated (09/13/2022)   Social Connection and Isolation Panel [NHANES]    Frequency of Communication with Friends and Family: More than three times a week    Frequency of Social Gatherings with Friends and Family: More than three times a week    Attends Religious Services: Never    Database administrator or  Organizations: No    Attends Engineer, structural: Never    Marital Status: Married   Family History  Problem Relation Age of Onset   Stroke Father 24   Colon cancer Maternal Aunt    Tremor Mother    Lung cancer Brother 77   Rheum arthritis Sister 39   Colon cancer Daughter    COPD Sister    Cancer Sister        in nose   Diabetes Daughter    Hypertension Daughter    Healthy Daughter    Bell's palsy Maternal Grandmother    Esophageal cancer Neg Hx    Rectal cancer Neg Hx    Stomach cancer Neg Hx    Allergies  Allergen Reactions   Zofran [Ondansetron Hcl] Other (See Comments)    Prolong QT   Ace Inhibitors Cough    Occurred with lisinopril   Levofloxacin Other (See Comments)    Felt things on her legs that were not there,made her stomach hurt   Lisinopril Cough   Sulfonamide Derivatives Hives and Itching   Sulfa Antibiotics Hives and Itching   Prior to Admission medications   Medication Sig Start Date End Date Taking? Authorizing Provider  acetaminophen (TYLENOL) 650 MG CR tablet Take 1,300 mg by mouth every 8 (eight) hours as needed for pain.   Yes [provider]  aspirin EC 81 MG tablet Take 81 mg by mouth daily. Swallow whole.   Yes [provider]  cetirizine (ZYRTEC) 10 MG tablet Take 10 mg by mouth daily.   Yes [provider]  clobetasol (TEMOVATE) 0.05 % external solution Apply 1 Application topically daily. To scalp up to 5 times per week Patient taking differently: Apply 1 Application topically daily as needed (eczema). To scalp up to 5 times per week 02/04/23  Yes Deirdre Evener, MD  cromolyn (OPTICROM) 4 % ophthalmic solution Place 1 drop into both eyes 3 (three) times daily as needed (itchy eyes). 05/05/20  Yes [provider]  Cyanocobalamin (B-12) 5000 MCG CAPS Take 5,000 mcg by mouth daily.  Yes [provider]  diclofenac Sodium (VOLTAREN) 1 % GEL Apply 2 g topically 4 (four) times daily as needed  (pain). 08/12/22  Yes [provider]  diphenhydramine-acetaminophen (TYLENOL PM) 25-500 MG TABS tablet Take 2 tablets by mouth at bedtime as needed (sleep).   Yes [provider]  Dulaglutide (TRULICITY) 3 MG/0.5ML SOPN Inject 3 mg as directed once a week. 03/04/23  Yes Vonna Drafts, MD  DULERA 100-5 MCG/ACT AERO Inhale 2 puffs into the lungs 2 (two) times daily. 02/26/23  Yes [provider]  fluticasone (FLONASE) 50 MCG/ACT nasal spray Place 2 sprays into both nostrils daily as needed for allergies. 03/04/23  Yes Vonna Drafts, MD  gabapentin (NEURONTIN) 300 MG capsule TAKE 1 CAPSULE BY MOUTH EVERYDAY AT BEDTIME 08/12/22  Yes Littie Deeds, MD  leflunomide (ARAVA) 10 MG tablet Take 10 mg by mouth daily.   Yes [provider]  metFORMIN (GLUCOPHAGE-XR) 500 MG 24 hr tablet TAKE 2 TABLETS BY MOUTH TWICE A DAY 10/15/22  Yes Littie Deeds, MD  metoprolol succinate (TOPROL-XL) 25 MG 24 hr tablet Take 1 tablet (25 mg total) by mouth at bedtime. Patient taking differently: Take 25 mg by mouth daily. 11/22/22 05/21/23 Yes Tolia, Sunit, DO  mirabegron ER (MYRBETRIQ) 25 MG TB24 tablet Take 25 mg by mouth daily.   Yes [provider]  mometasone (ELOCON) 0.1 % cream APPLY 1 APPLICATION TOPICALLY DAILY AS NEEDED (RASH). 02/04/23  Yes Deirdre Evener, MD  montelukast (SINGULAIR) 10 MG tablet TAKE 1 TABLET BY MOUTH EVERY DAY Patient taking differently: Take 10 mg by mouth at bedtime. 08/31/21  Yes Littie Deeds, MD  mupirocin ointment (BACTROBAN) 2 % APPLY ONCE DAILY TO OPEN SORES ON ARMS, LEGS, TOES UNTIL HEALED, THEN APPLY AS NEEDED FOR FLARES Patient taking differently: Apply 1 Application topically daily as needed (wound care). 02/24/23  Yes Deirdre Evener, MD  nitroGLYCERIN (NITROSTAT) 0.4 MG SL tablet Place 1 tablet (0.4 mg total) under the tongue every 5 (five) minutes as needed. 10/10/20  Yes Hensel, Santiago Bumpers, MD  omeprazole (PRILOSEC) 40 MG capsule Take 1 capsule (40  mg total) by mouth in the morning. 02/19/23  Yes Vonna Drafts, MD  oxyCODONE-acetaminophen (PERCOCET) 10-325 MG tablet Take 1 tablet by mouth every 6 (six) hours as needed for pain. 03/04/23 04/03/23 Yes Vonna Drafts, MD  rosuvastatin (CRESTOR) 10 MG tablet Take 1 tablet (10 mg total) by mouth daily. Patient taking differently: Take 10 mg by mouth every evening. 03/20/22  Yes Littie Deeds, MD  sacubitril-valsartan (ENTRESTO) 24-26 MG Take 1 tablet by mouth 2 (two) times daily. 11/22/22 05/21/23 Yes Tolia, Sunit, DO  sertraline (ZOLOFT) 50 MG tablet TAKE 1 TABLET BY MOUTH EVERY DAY 10/15/22  Yes Littie Deeds, MD  Thiamine HCl (VITAMIN B-1) 250 MG tablet Take 250 mg by mouth daily.   Yes [provider]  VENTOLIN HFA 108 (90 Base) MCG/ACT inhaler INHALE 2 PUFFS BY MOUTH EVERY 4 HOURS AS NEEDED FOR WHEEZING OR SHORTNESS OF BREATH. 12/25/22  Yes Littie Deeds, MD  Accu-Chek Softclix Lancets lancets Please use to check blood sugar up to two times per day. Z61.09 09/06/21   Cora Collum, DO  Blood Glucose Monitoring Suppl (ONE TOUCH ULTRA 2) w/Device KIT USE AS DIRECTED 09/09/22   Littie Deeds, MD  Dulaglutide 3 MG/0.5ML SOPN Inject 3 mg into the skin once a week. Patient not taking: Reported on 03/25/2023 12/11/22   Littie Deeds, MD  glucose blood test strip Use  as instructed a few times a week as needed 07/19/22   Littie Deeds, MD  glucose blood test strip Use to check blood glucose 2 times a day as needed 08/02/22   Nestor Ramp, MD    ROS: All other systems have been reviewed and were otherwise negative with the exception of those mentioned in the HPI and as above.  Physical Exam: General: Alert, no acute distress Cardiovascular: No pedal edema Respiratory: No cyanosis, no use of accessory musculature GI: No organomegaly, abdomen is soft and non-tender Skin: No lesions in the area of chief complaint Neurologic: Sensation intact distally Psychiatric: Patient is competent for consent with normal  mood and affect Lymphatic: No axillary or cervical lymphadenopathy  MUSCULOSKELETAL:  The range of motion of her knee is 0-100 degrees. There is pain in deep flexion. Tender to palpation over the medial joint line.   Imaging: MRI is reviewed and demonstrates a significant medial meniscus tear, complex component.   Assessment: RIGHT KNEE MEDIAL MENISCUS TEAR, SYNOVITITS  Plan: Plan for Procedure(s): KNEE ARTHROSCOPY WITH MEDIAL MENISECTOMY SYNOVECTOMY   The risks benefits and alternatives were discussed with the patient including but not limited to the risks of nonoperative treatment, versus surgical intervention including infection, bleeding, nerve injury,  blood clots, cardiopulmonary complications, morbidity, mortality, among others, and they were willing to proceed.   The patient acknowledged the explanation, agreed to proceed with the plan and consent was signed.   Operative Plan: Right knee scope with medial meniscectomy Discharge Medications: standard DVT Prophylaxis: aspirin Physical Therapy: outpatient PT Special Discharge needs: +/-   Vernetta Honey, PA-C  03/25/2023 12:39 PM

## 2023-03-25 NOTE — Anesthesia Preprocedure Evaluation (Signed)
Anesthesia Evaluation  Patient identified by MRN, date of birth, ID band Patient awake    Reviewed: Allergy & Precautions, NPO status , Patient's Chart, lab work & pertinent test results, reviewed documented beta blocker date and time   Airway Mallampati: III  TM Distance: >3 FB Neck ROM: Full    Dental  (+) Edentulous Upper, Edentulous Lower, Dental Advisory Given   Pulmonary asthma , former smoker   Pulmonary exam normal breath sounds clear to auscultation       Cardiovascular hypertension, Pt. on home beta blockers and Pt. on medications + angina  +CHF  Normal cardiovascular exam Rhythm:Regular Rate:Normal  CV: Cardiac Cath 07/30/2022 LM: Nonexistent. Dual ostial present to LAD and Lcx LAD: Mild medial calcification. No significant disease Lcx: No significant disease RCA: No significant disease   LVEDP 12 mmHg RA: 2 mmHg RV: 19/1 mmHg PA: 20/8 mmHg, mPAP 13 mmHg PCW: 3 mmHg   CO: 4.9 L/min CI: 3.1 L/min/m2   No oxygen step up on shunt run Well compensated nonischemic cardiomyopathy   Echo 07/17/2022  1. Left ventricular ejection fraction, by estimation, is 35 to 40%. The  left ventricle has moderately decreased function. The left ventricle  demonstrates global hypokinesis. Left ventricular diastolic parameters are  consistent with Grade I diastolic  dysfunction (impaired relaxation).   2. Right ventricular systolic function is normal. The right ventricular  size is normal.   3. The mitral valve is normal in structure. Mild mitral valve  regurgitation. No evidence of mitral stenosis.   4. The aortic valve is normal in structure. Aortic valve regurgitation is  not visualized. No aortic stenosis is present.   5. The inferior vena cava is normal in size with greater than 50%  respiratory variability, suggesting right atrial pressure of 3 mmHg.     Neuro/Psych  PSYCHIATRIC DISORDERS Anxiety     negative neurological  ROS     GI/Hepatic Neg liver ROS,GERD  ,,  Endo/Other  diabetes, Type 2, Oral Hypoglycemic Agents    Renal/GU negative Renal ROS  negative genitourinary   Musculoskeletal  (+) Arthritis , Rheumatoid disorders,    Abdominal   Peds  Hematology negative hematology ROS (+)   Anesthesia Other Findings   Reproductive/Obstetrics                             Anesthesia Physical Anesthesia Plan  ASA: 3  Anesthesia Plan: General   Post-op Pain Management: Tylenol PO (pre-op)*   Induction: Intravenous  PONV Risk Score and Plan: 3 and Ondansetron, Dexamethasone and Treatment may vary due to age or medical condition  Airway Management Planned: LMA  Additional Equipment:   Intra-op Plan:   Post-operative Plan: Extubation in OR  Informed Consent: I have reviewed the patients History and Physical, chart, labs and discussed the procedure including the risks, benefits and alternatives for the proposed anesthesia with the patient or authorized representative who has indicated his/her understanding and acceptance.     Dental advisory given  Plan Discussed with: CRNA  Anesthesia Plan Comments: (See PAT note 03/25/23)       Anesthesia Quick Evaluation

## 2023-03-25 NOTE — Progress Notes (Signed)
Anesthesia Chart Review   Case: [id number] Date/Time: 03/26/23 0715   Procedures:      KNEE ARTHROSCOPY WITH MEDIAL MENISECTOMY (Right: Knee)     SYNOVECTOMY (Right)   Anesthesia type: General   Pre-op diagnosis: RIGHT KNEE MEDIAL MENISCUS TEAR, SYNOVITITS   Location: [facility] ROOM 06 / [facility]   Surgeons: Bjorn Pippin, MD       DISCUSSION:72 y.o. former smoker with h/o GERD, HTN, asthma, DM II, CHF, right knee medial meniscus tear scheduled for above procedure 03/26/2023 with Dr. Ramond Marrow.   Pt seen by PCP 03/04/2023 for preoperative evaluation.  Per OV note, "Patient cleared for right knee arthroscopy and synovectomy with Delbert Harness.  Advised to hold aspirin on the morning of the surgery.  Her blood pressure is stable, does not need to hold her metoprolol or Entresto. "  Per cardiology preoperative evaluation 01/26/2023, "JADIN GARBISCH is at acceptable risk, from a cardiac standpoint, for her upcoming surgery: Right knee scope and synovectomy.  Recommended that the surgery be done at the hospital.  Hold aspirin 7 days prior to the surgery and restart postsurgery when appropriate hemostasis has been achieved as per your recommendations."  VS: There were no vitals taken for this visit.  PROVIDERS: Vonna Drafts, MD is PCP  Tessa Lerner, DO is Cardiologist  LABS: Labs reviewed: Acceptable for surgery. (all labs ordered are listed, but only abnormal results are displayed)  Labs Reviewed - No data to display   IMAGES:   EKG:   CV: Cardiac Cath 07/30/2022 LM: Nonexistent. Dual ostial present to LAD and Lcx LAD: Mild medial calcification. No significant disease Lcx: No significant disease RCA: No significant disease   LVEDP 12 mmHg RA: 2 mmHg RV: 19/1 mmHg PA: 20/8 mmHg, mPAP 13 mmHg PCW: 3 mmHg   CO: 4.9 L/min CI: 3.1 L/min/m2   No oxygen step up on shunt run Well compensated nonischemic cardiomyopathy  Echo 07/17/2022  1. Left ventricular ejection fraction, by  estimation, is 35 to 40%. The  left ventricle has moderately decreased function. The left ventricle  demonstrates global hypokinesis. Left ventricular diastolic parameters are  consistent with Grade I diastolic  dysfunction (impaired relaxation).   2. Right ventricular systolic function is normal. The right ventricular  size is normal.   3. The mitral valve is normal in structure. Mild mitral valve  regurgitation. No evidence of mitral stenosis.   4. The aortic valve is normal in structure. Aortic valve regurgitation is  not visualized. No aortic stenosis is present.   5. The inferior vena cava is normal in size with greater than 50%  respiratory variability, suggesting right atrial pressure of 3 mmHg.   Past Medical History:  Diagnosis Date   Actinic keratosis    Allergic rhinitis 08/28/2006   Anemia    low iron   Anginal pain (HCC)    Asthma, chronic 03/14/2010   Does seem to use her albuterol excessively. Consider change to more manageable medicines and treat more like COPD in the future.   Carpal tunnel syndrome, left 05/29/2009   Last Assessment & Plan:  Advised to wear wrist brace at night when she is sleeping.  If this does not help will consider having her go to sports medicine.   CHF (congestive heart failure) (HCC) 07/2022   HFrEF, NYHA Class I   Chronic frontal sinusitis 08/28/2016   Chronic left maxillary sinusitis 08/28/2016   Diverticulosis    Essential hypertension 10/30/2012   Gastroparesis 11/19/2011  DM and chronic narcotics contribute.  Causes dysphagia like symptoms    Generalized anxiety disorder 08/28/2006   GERD (gastroesophageal reflux disease)    Hearing loss 07/19/2015   Hearing aid in right ear   History of cataracts, bilateral    Hyperlipidemia 08/28/2006   Lumbar radiculopathy 04/07/2018   Lung nodules    Nonischemic cardiomyopathy (HCC)    Osteoporosis 08/08/2011   Pain in joint, pelvic region and thigh 11/18/2012   Radial styloid  tenosynovitis 08/15/2013   Restless leg syndrome 05/14/2012   Seropositive rheumatoid arthritis 01/08/2010   Patient is being followed by rheumatologist at [facility] patient is on Plaquenil and as well as prednisone.   Changed to methotrexate and prednisone in May of 2013  Patient does have a pain contract here with [facility] receives oxycodone 10/325 mg tabs every 6 hours. This has been titrated down from significant doses of methadone previously. Could attempt to tit   Shoulder joint pain 03/25/2011   The patient is compensating due to her not being able to use her left arm right now rotator cuff impingement if he continues to worsen would not be surprised if she does get a tear on this side as well   Squamous cell carcinoma in situ of skin 11/25/2014   Referred to dermatology for excision    Squamous cell carcinoma of skin 12/26/2014   Right distal pretibial. SCC-KA pattern.   Squamous cell carcinoma of skin 03/27/2015   Right inf. lat. knee. SCC-KA pattern.    Squamous cell carcinoma of skin 10/07/2016   Left lateral calf superior. WD SCC.   Squamous cell carcinoma of skin 10/07/2016   Left lateral calf inferior. WD SCC.   Squamous cell carcinoma of skin 04/28/2018   Left mid med. pretibial. WD SCC.   Squamous cell carcinoma of skin 08/12/2019   Left mid med. pretibial sup. KA type. EDC   Squamous cell carcinoma of skin 08/12/2019   Left mid med. pretibial inferior. WD. EDC.   Tear of medial meniscus of knee, left 04/27/2009   Type II diabetes mellitus 08/28/2006   Overview:  Patient has her ups and downs, go in line with flares of her RA and prednisone uses.  Patient does have hx of hypoglycemic events so would not try for perfect control but should have A1c goal around 7.0 Lab Results  Component Value Date   HGBA1C 6.9 12/27/2011   Last Assessment & Plan:  At goal. Will continue current metformin    Past Surgical History:  Procedure Laterality Date    ABDOMINAL HYSTERECTOMY     APPENDECTOMY     BACK SURGERY     BIOPSY  05/10/2021   Procedure: BIOPSY;  Surgeon: Rachael Fee, MD;  Location: [facility];  Service: Endoscopy;;  EGD and COLON   CARPAL TUNNEL RELEASE Right    CATARACT EXTRACTION Left    with lid lift   CHOLECYSTECTOMY     COLONOSCOPY     COLONOSCOPY WITH PROPOFOL N/A 05/10/2021   Procedure: COLONOSCOPY WITH PROPOFOL;  Surgeon: Rachael Fee, MD;  [facility];  Service: Endoscopy;  Laterality: N/A;   ESOPHAGOGASTRODUODENOSCOPY (EGD) WITH PROPOFOL N/A 05/10/2021   Procedure: ESOPHAGOGASTRODUODENOSCOPY (EGD) WITH PROPOFOL;  Surgeon: Rachael Fee, MD;  [facility];  Service: Endoscopy;  Laterality: N/A;   HERNIA REPAIR     KNEE SURGERY Right    x2- cartilage annd scar tissue   LEFT AND RIGHT HEART CATHETERIZATION WITH CORONARY  ANGIOGRAM N/A 08/17/2013   Procedure: LEFT AND RIGHT HEART CATHETERIZATION WITH CORONARY ANGIOGRAM;  Surgeon: Pamella Pert, MD;  Location: [facility] CATH LAB;  Service: Cardiovascular;  Laterality: N/A;   lumbar back surgery  04/04/2018   done by neurosurgeron   right foot bone spurs removed     RIGHT/LEFT HEART CATH AND CORONARY ANGIOGRAPHY N/A 07/30/2022   Procedure: RIGHT/LEFT HEART CATH AND CORONARY ANGIOGRAPHY;  Surgeon: Elder Negus, MD;  Location: [facility] INVASIVE CV LAB;  Service: Cardiovascular;  Laterality: N/A;   ROTATOR CUFF REPAIR Right    SHOULDER SURGERY Left    x2- rotatar cuff tear   UPPER GASTROINTESTINAL ENDOSCOPY      MEDICATIONS: No current facility-administered medications for this encounter.    acetaminophen (TYLENOL) 650 MG CR tablet   aspirin EC 81 MG tablet   cetirizine (ZYRTEC) 10 MG tablet   clobetasol (TEMOVATE) 0.05 % external solution   cromolyn (OPTICROM) 4 % ophthalmic solution   Cyanocobalamin (B-12) 5000 MCG CAPS   diclofenac Sodium (VOLTAREN) 1 % GEL   diphenhydramine-acetaminophen (TYLENOL PM) 25-500 MG TABS tablet    Dulaglutide (TRULICITY) 3 MG/0.5ML SOPN   DULERA 100-5 MCG/ACT AERO   fluticasone (FLONASE) 50 MCG/ACT nasal spray   gabapentin (NEURONTIN) 300 MG capsule   leflunomide (ARAVA) 10 MG tablet   metFORMIN (GLUCOPHAGE-XR) 500 MG 24 hr tablet   metoprolol succinate (TOPROL-XL) 25 MG 24 hr tablet   mirabegron ER (MYRBETRIQ) 25 MG TB24 tablet   mometasone (ELOCON) 0.1 % cream   montelukast (SINGULAIR) 10 MG tablet   mupirocin ointment (BACTROBAN) 2 %   nitroGLYCERIN (NITROSTAT) 0.4 MG SL tablet   omeprazole (PRILOSEC) 40 MG capsule   oxyCODONE-acetaminophen (PERCOCET) 10-325 MG tablet   rosuvastatin (CRESTOR) 10 MG tablet   sacubitril-valsartan (ENTRESTO) 24-26 MG   sertraline (ZOLOFT) 50 MG tablet   Thiamine HCl (VITAMIN B-1) 250 MG tablet   VENTOLIN HFA 108 (90 Base) MCG/ACT inhaler   Accu-Chek Softclix Lancets lancets   Blood Glucose Monitoring Suppl (ONE TOUCH ULTRA 2) w/Device KIT   Dulaglutide 3 MG/0.5ML SOPN   glucose blood test strip   glucose blood test strip     [facility], PA-C [facility] [phone]

## 2023-03-25 NOTE — Progress Notes (Signed)
For Anesthesia: PCP - Vonna Drafts, MD  last office note and surgical clearance  on 03/04/23 in [facility]  Cardiologist - Tessa Lerner, DO last office visit note 11/22/22 in [facility]  Neurologist- Allena Katz, Noberto Retort, DO/Wertman, Sung Amabile, PA-C  Oncologist- Jaci Standard, MD   Chest x-ray - 05/29/22 in [facility] EKG - 11/22/22 in [facility] Stress Test -  ECHO - 07/14/22 in [facility] Cardiac Cath - 07/30/22 Pacemaker/ICD device last checked: Pacemaker orders received: Device Rep notified:  Spinal Cord Stimulator:  Sleep Study - 01/16/2017 in [facility] CPAP - N/A  Fasting Blood Sugar -  Checks Blood Sugar _____ times a day Date and result of last Hgb A1c- 7.6 on 03/04/23 in [facility] (patient reports that she was on a 35-day course of prednisone during this time. )  Last dose of GLP1 agonist- Trulicity GLP1 instructions:   Last dose of SGLT-2 inhibitors-  SGLT-2 instructions:  Blood Thinner Instructions: Aspirin Instructions: Per note 03/04/23 Advised to hold aspirin on the morning of the surgery.  Last Dose:  Activity level: Can go up a flight of stairs and activities of daily living without stopping and without chest pain and/or shortness of breath   Able to exercise without chest pain and/or shortness of breath   Unable to go up a flight of stairs without chest pain and/or shortness of breath     Anesthesia review: compensated nonischemic cardiomyopathy, chronic congestive heart failure, coronary artery calcification, aortic atherosclerosis, former smoker, non-insulin-dependent diabetes mellitus type 2, hyperlipidemia, hypertension.   Patient denies shortness of breath, fever, cough and chest pain during pre op phone call.   Patient verbalized understanding of instructions reviewed via telephone.

## 2023-03-26 ENCOUNTER — Other Ambulatory Visit: Payer: Self-pay

## 2023-03-26 ENCOUNTER — Ambulatory Visit (HOSPITAL_BASED_OUTPATIENT_CLINIC_OR_DEPARTMENT_OTHER): Payer: 59 | Admitting: Physician Assistant

## 2023-03-26 ENCOUNTER — Encounter (HOSPITAL_COMMUNITY): Payer: Self-pay | Admitting: Orthopaedic Surgery

## 2023-03-26 ENCOUNTER — Ambulatory Visit (HOSPITAL_COMMUNITY): Payer: 59 | Admitting: Physician Assistant

## 2023-03-26 ENCOUNTER — Encounter (HOSPITAL_COMMUNITY)
Admission: RE | Disposition: A | Discharge status: Home / Self Care | Payer: Self-pay | Source: Home / Self Care | Attending: Orthopaedic Surgery

## 2023-03-26 ENCOUNTER — Ambulatory Visit (HOSPITAL_COMMUNITY)
Admission: RE | Admit: 2023-03-26 | Discharge: 2023-03-26 | Disposition: A | Discharge status: Home / Self Care | Payer: 59 | Attending: Orthopaedic Surgery | Admitting: Orthopaedic Surgery

## 2023-03-26 DIAGNOSIS — I110 Hypertensive heart disease with heart failure: Secondary | ICD-10-CM | POA: Diagnosis not present

## 2023-03-26 DIAGNOSIS — S83241A Other tear of medial meniscus, current injury, right knee, initial encounter: Secondary | ICD-10-CM

## 2023-03-26 DIAGNOSIS — Z8249 Family history of ischemic heart disease and other diseases of the circulatory system: Secondary | ICD-10-CM | POA: Insufficient documentation

## 2023-03-26 DIAGNOSIS — Z7985 Long-term (current) use of injectable non-insulin antidiabetic drugs: Secondary | ICD-10-CM | POA: Diagnosis not present

## 2023-03-26 DIAGNOSIS — E119 Type 2 diabetes mellitus without complications: Secondary | ICD-10-CM | POA: Insufficient documentation

## 2023-03-26 DIAGNOSIS — Z7984 Long term (current) use of oral hypoglycemic drugs: Secondary | ICD-10-CM | POA: Diagnosis not present

## 2023-03-26 DIAGNOSIS — R296 Repeated falls: Secondary | ICD-10-CM | POA: Insufficient documentation

## 2023-03-26 DIAGNOSIS — Z85828 Personal history of other malignant neoplasm of skin: Secondary | ICD-10-CM | POA: Insufficient documentation

## 2023-03-26 DIAGNOSIS — S83231A Complex tear of medial meniscus, current injury, right knee, initial encounter: Secondary | ICD-10-CM | POA: Diagnosis not present

## 2023-03-26 DIAGNOSIS — I509 Heart failure, unspecified: Secondary | ICD-10-CM | POA: Insufficient documentation

## 2023-03-26 DIAGNOSIS — X58XXXA Exposure to other specified factors, initial encounter: Secondary | ICD-10-CM | POA: Diagnosis not present

## 2023-03-26 DIAGNOSIS — J45909 Unspecified asthma, uncomplicated: Secondary | ICD-10-CM

## 2023-03-26 DIAGNOSIS — Z87891 Personal history of nicotine dependence: Secondary | ICD-10-CM | POA: Diagnosis not present

## 2023-03-26 DIAGNOSIS — M65861 Other synovitis and tenosynovitis, right lower leg: Secondary | ICD-10-CM | POA: Diagnosis not present

## 2023-03-26 DIAGNOSIS — M659 Synovitis and tenosynovitis, unspecified: Secondary | ICD-10-CM | POA: Diagnosis not present

## 2023-03-26 HISTORY — PX: KNEE ARTHROSCOPY WITH MEDIAL MENISECTOMY: SHX5651

## 2023-03-26 HISTORY — DX: Other cardiomyopathies: I42.8

## 2023-03-26 HISTORY — PX: SYNOVECTOMY: SHX5180

## 2023-03-26 LAB — GLUCOSE, CAPILLARY
Glucose-Capillary: 117 mg/dL — ABNORMAL HIGH (ref 70–99)
Glucose-Capillary: 151 mg/dL — ABNORMAL HIGH (ref 70–99)

## 2023-03-26 SURGERY — ARTHROSCOPY, KNEE, WITH MEDIAL MENISCECTOMY
Anesthesia: General | Site: Knee | Laterality: Right

## 2023-03-26 MED ORDER — DEXAMETHASONE SODIUM PHOSPHATE 10 MG/ML IJ SOLN
INTRAMUSCULAR | Status: AC
  Filled 2023-03-26: qty 1

## 2023-03-26 MED ORDER — CEFAZOLIN SODIUM-DEXTROSE 2-4 GM/100ML-% IV SOLN
2.0000 g | INTRAVENOUS | Status: AC
  Administered 2023-03-26: 2 g via INTRAVENOUS
  Filled 2023-03-26: qty 100

## 2023-03-26 MED ORDER — ORAL CARE MOUTH RINSE: 15.0000 mL | Freq: Once | OROMUCOSAL | Status: AC

## 2023-03-26 MED ORDER — ACETAMINOPHEN 500 MG PO TABS: 1000.0000 mg | ORAL_TABLET | Freq: Three times a day (TID) | ORAL | 0 refills | Status: AC

## 2023-03-26 MED ORDER — INSULIN ASPART 100 UNIT/ML IJ SOLN: 0.0000 [IU] | INTRAMUSCULAR | Status: DC | PRN

## 2023-03-26 MED ORDER — ACETAMINOPHEN 500 MG PO TABS
1000.0000 mg | ORAL_TABLET | Freq: Once | ORAL | Status: AC
  Administered 2023-03-26: 1000 mg via ORAL
  Filled 2023-03-26: qty 2

## 2023-03-26 MED ORDER — MELOXICAM 15 MG PO TABS: 15.0000 mg | ORAL_TABLET | Freq: Every day | ORAL | 2 refills | Status: AC

## 2023-03-26 MED ORDER — BUPIVACAINE HCL 0.25 % IJ SOLN
INTRAMUSCULAR | Status: AC
  Filled 2023-03-26: qty 1

## 2023-03-26 MED ORDER — KETOROLAC TROMETHAMINE 15 MG/ML IJ SOLN
INTRAMUSCULAR | Status: DC | PRN
Start: 2023-03-26 — End: 2023-03-26
  Administered 2023-03-26: 15 mg via INTRAVENOUS

## 2023-03-26 MED ORDER — CHLORHEXIDINE GLUCONATE 0.12 % MT SOLN
15.0000 mL | Freq: Once | OROMUCOSAL | Status: AC
  Administered 2023-03-26: 15 mL via OROMUCOSAL

## 2023-03-26 MED ORDER — ASPIRIN 81 MG PO TBEC: 81.0000 mg | DELAYED_RELEASE_TABLET | Freq: Two times a day (BID) | ORAL | 0 refills | Status: AC

## 2023-03-26 MED ORDER — DEXAMETHASONE SODIUM PHOSPHATE 10 MG/ML IJ SOLN
INTRAMUSCULAR | Status: DC | PRN
  Administered 2023-03-26: 5 mg via INTRAVENOUS

## 2023-03-26 MED ORDER — LIDOCAINE HCL (PF) 2 % IJ SOLN
INTRAMUSCULAR | Status: AC
  Filled 2023-03-26: qty 5

## 2023-03-26 MED ORDER — PHENYLEPHRINE HCL (PRESSORS) 10 MG/ML IV SOLN
INTRAVENOUS | Status: AC
  Filled 2023-03-26: qty 1

## 2023-03-26 MED ORDER — PHENYLEPHRINE 80 MCG/ML (10ML) SYRINGE FOR IV PUSH (FOR BLOOD PRESSURE SUPPORT)
PREFILLED_SYRINGE | INTRAVENOUS | Status: DC | PRN
  Administered 2023-03-26 (×2): 160 ug via INTRAVENOUS

## 2023-03-26 MED ORDER — SODIUM CHLORIDE 0.9 % IR SOLN
Status: DC | PRN
  Administered 2023-03-26: 2000 mL

## 2023-03-26 MED ORDER — TRAMADOL HCL 50 MG PO TABS
50.0000 mg | ORAL_TABLET | Freq: Four times a day (QID) | ORAL | 0 refills | Status: AC | PRN
Start: 2023-03-26 — End: 2023-03-31

## 2023-03-26 MED ORDER — PROPOFOL 10 MG/ML IV BOLUS
INTRAVENOUS | Status: DC | PRN
Start: 2023-03-26 — End: 2023-03-26
  Administered 2023-03-26: 20 mg via INTRAVENOUS
  Administered 2023-03-26: 70 mg via INTRAVENOUS

## 2023-03-26 MED ORDER — FENTANYL CITRATE (PF) 100 MCG/2ML IJ SOLN
INTRAMUSCULAR | Status: DC | PRN
  Administered 2023-03-26 (×3): 25 ug via INTRAVENOUS

## 2023-03-26 MED ORDER — LIDOCAINE 2% (20 MG/ML) 5 ML SYRINGE
INTRAMUSCULAR | Status: DC | PRN
  Administered 2023-03-26: 40 mg via INTRAVENOUS

## 2023-03-26 MED ORDER — BUPIVACAINE HCL 0.25 % IJ SOLN
INTRAMUSCULAR | Status: DC | PRN
  Administered 2023-03-26: 20 mL

## 2023-03-26 MED ORDER — PROPOFOL 10 MG/ML IV BOLUS
INTRAVENOUS | Status: AC
  Filled 2023-03-26: qty 20

## 2023-03-26 MED ORDER — FENTANYL CITRATE (PF) 100 MCG/2ML IJ SOLN
INTRAMUSCULAR | Status: AC
  Filled 2023-03-26: qty 2

## 2023-03-26 MED ORDER — LACTATED RINGERS IV SOLN: INTRAVENOUS | Status: DC

## 2023-03-26 MED ORDER — ONDANSETRON HCL 4 MG/2ML IJ SOLN
INTRAMUSCULAR | Status: AC
  Filled 2023-03-26: qty 2

## 2023-03-26 SURGICAL SUPPLY — 40 items
APL PRP STRL LF DISP 70% ISPRP (MISCELLANEOUS) ×2
BAG COUNTER SPONGE SURGICOUNT (BAG) IMPLANT
BAG SPNG CNTER NS LX DISP (BAG)
BANDAGE ESMARK 6X9 LF (GAUZE/BANDAGES/DRESSINGS) IMPLANT
BLADE EXCALIBUR 4.0X13 (MISCELLANEOUS) IMPLANT
BNDG CMPR 6 X 5 YARDS HK CLSR (GAUZE/BANDAGES/DRESSINGS) ×2
BNDG CMPR 9X6 STRL LF SNTH (GAUZE/BANDAGES/DRESSINGS)
BNDG ELASTIC 6INX 5YD STR LF (GAUZE/BANDAGES/DRESSINGS) ×3 IMPLANT
BNDG ESMARK 6X9 LF (GAUZE/BANDAGES/DRESSINGS)
CHLORAPREP W/TINT 26 (MISCELLANEOUS) ×3 IMPLANT
CLSR STERI-STRIP ANTIMIC 1/2X4 (GAUZE/BANDAGES/DRESSINGS) ×3 IMPLANT
COVER SURGICAL LIGHT HANDLE (MISCELLANEOUS) ×3 IMPLANT
CUFF TOURN SGL QUICK 34 (TOURNIQUET CUFF)
CUFF TRNQT CYL 34X4.125X (TOURNIQUET CUFF) IMPLANT
DISSECTOR 3.5MM X 13CM CVD (MISCELLANEOUS) ×3 IMPLANT
DRAPE ARTHROSCOPY W/POUCH 114 (DRAPES) ×3 IMPLANT
DRAPE OEC MINIVIEW 54X84 (DRAPES) IMPLANT
DRAPE POUCH INSTRU U-SHP 10X18 (DRAPES) ×3 IMPLANT
DRAPE U-SHAPE 47X51 STRL (DRAPES) ×3 IMPLANT
ELECT PENCIL ROCKER SW 15FT (MISCELLANEOUS) IMPLANT
EVACUATOR 1/8 PVC DRAIN (DRAIN) IMPLANT
GAUZE PAD ABD 8X10 STRL (GAUZE/BANDAGES/DRESSINGS) ×3 IMPLANT
GAUZE SPONGE 4X4 12PLY STRL (GAUZE/BANDAGES/DRESSINGS) ×3 IMPLANT
GLOVE BIO SURGEON STRL SZ 6.5 (GLOVE) ×6 IMPLANT
GLOVE BIOGEL PI IND STRL 6.5 (GLOVE) ×3 IMPLANT
GLOVE BIOGEL PI IND STRL 8 (GLOVE) ×6 IMPLANT
GLOVE ECLIPSE 8.0 STRL XLNG CF (GLOVE) ×3 IMPLANT
GLOVE SURG ORTHO 8.0 STRL STRW (GLOVE) ×3 IMPLANT
GOWN STRL REUS W/ TWL LRG LVL3 (GOWN DISPOSABLE) ×6 IMPLANT
GOWN STRL REUS W/TWL LRG LVL3 (GOWN DISPOSABLE) ×4
KIT TURNOVER KIT A (KITS) IMPLANT
MANIFOLD NEPTUNE II (INSTRUMENTS) ×3 IMPLANT
PACK ARTHROSCOPY WL (CUSTOM PROCEDURE TRAY) ×3 IMPLANT
PADDING CAST COTTON 6X4 STRL (CAST SUPPLIES) ×3 IMPLANT
SPIKE FLUID TRANSFER (MISCELLANEOUS) ×3 IMPLANT
SPONGE T-LAP 4X18 ~~LOC~~+RFID (SPONGE) ×3 IMPLANT
SUT MNCRL AB 4-0 PS2 18 (SUTURE) ×3 IMPLANT
TOWEL OR 17X26 10 PK STRL BLUE (TOWEL DISPOSABLE) ×3 IMPLANT
TUBING ARTHROSCOPY IRRIG 16FT (MISCELLANEOUS) ×3 IMPLANT
WAND ABLATOR APOLLO I90 (BUR) IMPLANT

## 2023-03-26 NOTE — Transfer of Care (Signed)
Immediate Anesthesia Transfer of Care Note  Patient: Michelle [name]  Procedure(s) Performed: KNEE ARTHROSCOPY WITH MEDIAL MENISECTOMY (Right: Knee) SYNOVECTOMY (Right)  Patient Location: PACU  Anesthesia Type:General  Level of Consciousness: awake, alert , and oriented  Airway & Oxygen Therapy: Patient Spontanous Breathing and Patient connected to face mask oxygen  Post-op Assessment: Report given to RN and Post -op Vital signs reviewed and stable  Post vital signs: Reviewed and stable  Last Vitals:  Vitals Value Taken Time  BP 127/81 03/26/23 0809  Temp    Pulse 80 03/26/23 0810  Resp 12 03/26/23 0810  SpO2 100 % 03/26/23 0810  Vitals shown include unfiled device data.  Last Pain:  Vitals:   03/26/23 0556  TempSrc: Oral  PainSc:          Complications: No notable events documented.

## 2023-03-26 NOTE — Anesthesia Postprocedure Evaluation (Signed)
Anesthesia Post Note  Patient: CHENEE CORELLA  Procedure(s) Performed: KNEE ARTHROSCOPY WITH MEDIAL MENISECTOMY (Right: Knee) SYNOVECTOMY (Right)     Patient location during evaluation: PACU Anesthesia Type: General Level of consciousness: awake and alert Pain management: pain level controlled Vital Signs Assessment: post-procedure vital signs reviewed and stable Respiratory status: spontaneous breathing, nonlabored ventilation, respiratory function stable and patient connected to nasal cannula oxygen Cardiovascular status: blood pressure returned to baseline and stable Postop Assessment: no apparent nausea or vomiting Anesthetic complications: no  No notable events documented.  Last Vitals:  Vitals:   03/26/23 0845 03/26/23 0900  BP: 107/61 116/61  Pulse: 80 80  Resp: 15 13  Temp:  36.7 C  SpO2: 93% 95%    Last Pain:  Vitals:   03/26/23 0810  TempSrc:   PainSc: 0-No pain                 Kule Gascoigne L Yaeli Hartung

## 2023-03-26 NOTE — Op Note (Signed)
Orthopaedic Surgery Operative Note (CSN: [id number])  Michelle [name]  03-Nov-1950 Date of Surgery: 03/26/2023   Diagnoses:  RIGHT KNEE MEDIAL MENISCUS TEAR, SYNOVITIS  Procedure: Right medial meniscectomy and synovectomy 2 compartment   Operative Finding Patient's knee was relatively preserved considering her age.  She had some grade 1 softening of the medial compartment however she had a complex medial meniscus tear with a parrot-beak type component.  We were able to debride the meniscus back to a stable base without issue.  About 40% total medial meniscal volume was resected.  Successful completion of the planned procedure.    Post-operative plan: The patient will be WBAT.  The patient will be discharged home.  DVT prophylaxis Aspirin 81 mg twice daily for 6 weeks.  Pain control with PRN pain medication preferring oral medicines.  Follow up plan will be scheduled in approximately 7 days for incision check.  Post-Op Diagnosis: Same Surgeons:Primary: Bjorn Pippin, MD Assistants:Caroline McBane PA-C Location: [facility] ROOM 06 Anesthesia: General with local Antibiotics: Ancef 2 g Tourniquet time:  Estimated Blood Loss: Minimal Complications: None Specimens: None Implants: * No implants in log *  Indications for Surgery:   Michelle [name] is a 72 y.o. female with meniscus tear and mechanical symptoms.  Benefits and risks of operative and nonoperative management were discussed prior to surgery with patient/guardian(s) and informed consent form was completed.  Specific risks including infection, need for additional surgery, postmeniscectomy syndrome, continued arthrosis and pain amongst others.   Procedure:   The patient was identified properly. Informed consent was obtained and the surgical site was marked. The patient was taken up to suite where general anesthesia was induced. The patient was placed in the supine position with a post against the surgical leg and a nonsterile tourniquet  applied. The surgical leg was then prepped and draped usual sterile fashion.  A standard surgical timeout was performed.  2 standard anterior portals were made and diagnostic arthroscopy performed. Please note the findings as noted above.  We used a shaver to perform a synovectomy of the anterior medial and anterolateral compartments as well as overgrowth along the patella.  We performed a medial meniscectomy using a shaver and basket back to a stable base removing all loose fragments.  Incisions closed with absorbable suture. The patient was awoken from general anesthesia and taken to the PACU in stable condition without complication.   Alfonse Alpers, PA-C, present and scrubbed throughout the case, critical for completion in a timely fashion, and for retraction, instrumentation, closure.

## 2023-03-26 NOTE — Interval H&P Note (Signed)
All questions answered, patient wants to proceed with procedure. ? ?

## 2023-03-26 NOTE — Anesthesia Procedure Notes (Signed)
Procedure Name: LMA Insertion Date/Time: 03/26/2023 7:32 AM  Performed by: Florene Route, CRNAPatient Re-evaluated:Patient Re-evaluated prior to induction Oxygen Delivery Method: Circle system utilized Preoxygenation: Pre-oxygenation with 100% oxygen Induction Type: IV induction LMA: LMA inserted LMA Size: 4.0 Number of attempts: 1 Placement Confirmation: positive ETCO2 and breath sounds checked- equal and bilateral Tube secured with: Tape Dental Injury: Teeth and Oropharynx as per pre-operative assessment

## 2023-03-29 ENCOUNTER — Encounter (HOSPITAL_COMMUNITY): Payer: Self-pay | Admitting: Orthopaedic Surgery

## 2023-03-31 DIAGNOSIS — S83231D Complex tear of medial meniscus, current injury, right knee, subsequent encounter: Secondary | ICD-10-CM | POA: Diagnosis not present

## 2023-03-31 DIAGNOSIS — M25661 Stiffness of right knee, not elsewhere classified: Secondary | ICD-10-CM | POA: Diagnosis not present

## 2023-03-31 DIAGNOSIS — M6281 Muscle weakness (generalized): Secondary | ICD-10-CM | POA: Diagnosis not present

## 2023-04-01 ENCOUNTER — Other Ambulatory Visit: Payer: Self-pay | Admitting: Family Medicine

## 2023-04-03 ENCOUNTER — Telehealth: Payer: Self-pay

## 2023-04-03 DIAGNOSIS — N3000 Acute cystitis without hematuria: Secondary | ICD-10-CM

## 2023-04-03 MED ORDER — AMOXICILLIN 500 MG PO CAPS
500.0000 mg | ORAL_CAPSULE | Freq: Three times a day (TID) | ORAL | 0 refills | Status: AC
Start: 2023-04-03 — End: 2023-04-08

## 2023-04-03 NOTE — Telephone Encounter (Signed)
Called patient and provided update. Patient appreciative.   Veronda Prude, RN

## 2023-04-03 NOTE — Telephone Encounter (Signed)
Patient calls nurse line regarding concerns for possible UTI.   She reports urinary frequency and hesitancy. Denies dysuria, malodorous urine, back or abdominal pain, fever and chills.    Advised recommendation for office visit for UTI symptoms. Patient reports that she had recent surgery on 9/25, which would make coming into the office very difficult for her.   Will forward request to PCP.   Michelle Prude, RN

## 2023-04-04 DIAGNOSIS — S83231D Complex tear of medial meniscus, current injury, right knee, subsequent encounter: Secondary | ICD-10-CM | POA: Diagnosis not present

## 2023-04-10 ENCOUNTER — Ambulatory Visit: Payer: Self-pay

## 2023-04-10 NOTE — Patient Outreach (Signed)
Care Coordination   Follow Up Visit Note   04/10/2023 Name: Michelle [name] MRN: [id number] DOB: 09/09/1950  Michelle [name] is a 72 y.o. year old female who sees Vonna Drafts, MD for primary care. I spoke with  [name] by phone today.  What matters to the patients health and wellness today?  I had a conversation with Michelle [name] this morning, and she informed me that she is doing well after her arthroscopy. She mentioned that she still experiences a little bit of pain with no swelling, but her blood sugars were good during the surgery. Her blood sugar was 112 in the morning after the surgery, and after eating, it was 234. However, it has mainly stayed between 135 and 147. She is scheduled to have her A1C rechecked either this month or next month. I advised her to continue taking her medications as prescribed, being cautious, and staying mobile.    Goals Addressed               This Visit's Progress     Diabetes Patient stated goal- lower my A1C (pt-stated)        Patient Goals/Self Care Activities: -Patient/Caregiver will take medications as prescribed   -Patient/Caregiver will attend all scheduled provider appointments -Patient/Caregiver will call pharmacy for medication refills 3-7 days in advance of running out of medications -Patient/Caregiver will call provider office for new concerns or questions  -Patient/Caregiver will focus on medication adherence by taking medications as prescribed   Objective:  Lab Results  Component Value Date   HGBA1C 7.6 (A) 03/04/2023   Lab Results  Component Value Date   CREATININE 0.55 03/05/2023   CREATININE 0.56 11/26/2022   CREATININE 0.42 (L) 08/29/2022   Lab Results  Component Value Date   EGFR 102 08/16/2022    adherence to prescribed medication regimen       contacting provider for new or worsened symptoms or questions      Reviewed medications with patient and discussed importance of medication adherence Review of  patient status, including review of consultants reports, relevant laboratory and other test results, and medications completed. Make sure to check blood sugars 2 hours after eating          SDOH assessments and interventions completed:  No     Care Coordination Interventions:  Yes, provided {THN Tip this will not be part of the note when signed-REQUIRED REPORT FIELD DO NOT DELETE (Optional):27901  Interventions Today    Flowsheet Row Most Recent Value  Chronic Disease   Chronic disease during today's visit Diabetes  General Interventions   General Interventions Discussed/Reviewed General Interventions Discussed  Pharmacy Interventions   Pharmacy Dicussed/Reviewed Pharmacy Topics Discussed  Safety Interventions   Safety Discussed/Reviewed Safety Discussed        Follow up plan: Follow up call scheduled for 05/15/23  1100 am    Encounter Outcome:  Patient Visit Completed   Juanell Fairly RN, BSN, [facility]   Care Coordinator Phone: [phone]

## 2023-04-10 NOTE — Patient Instructions (Signed)
Visit Information  Thank you for taking time to visit with me today. Please don't hesitate to contact me if I can be of assistance to you.   Following are the goals we discussed today:   Goals Addressed               This Visit's Progress     Diabetes Patient stated goal- lower my A1C (pt-stated)        Patient Goals/Self Care Activities: -Patient/Caregiver will take medications as prescribed   -Patient/Caregiver will attend all scheduled provider appointments -Patient/Caregiver will call pharmacy for medication refills 3-7 days in advance of running out of medications -Patient/Caregiver will call provider office for new concerns or questions  -Patient/Caregiver will focus on medication adherence by taking medications as prescribed   Objective:  Lab Results  Component Value Date   HGBA1C 7.6 (A) 03/04/2023   Lab Results  Component Value Date   CREATININE 0.55 03/05/2023   CREATININE 0.56 11/26/2022   CREATININE 0.42 (L) 08/29/2022   Lab Results  Component Value Date   EGFR 102 08/16/2022    adherence to prescribed medication regimen       contacting provider for new or worsened symptoms or questions      Reviewed medications with patient and discussed importance of medication adherence Review of patient status, including review of consultants reports, relevant laboratory and other test results, and medications completed. Make sure to check blood sugars 2 hours after eating          Our next appointment is by telephone on 05/15/23 at 1100 am  Please call the care guide team at [phone] if you need to cancel or reschedule your appointment.   If you are experiencing a Mental Health or Behavioral Health Crisis or need someone to talk to, please call 1-800-273-TALK (toll free, 24 hour hotline)  Patient verbalizes understanding of instructions and care plan provided today and agrees to view in MyChart. Active MyChart status and patient understanding of how to access  instructions and care plan via MyChart confirmed with patient.     Juanell Fairly RN, BSN, [facility] [employer] Phone: [phone]

## 2023-04-14 ENCOUNTER — Other Ambulatory Visit: Payer: Self-pay | Admitting: Family Medicine

## 2023-04-14 ENCOUNTER — Telehealth: Payer: Self-pay

## 2023-04-14 DIAGNOSIS — M059 Rheumatoid arthritis with rheumatoid factor, unspecified: Secondary | ICD-10-CM

## 2023-04-14 DIAGNOSIS — G8929 Other chronic pain: Secondary | ICD-10-CM

## 2023-04-14 MED ORDER — OXYCODONE-ACETAMINOPHEN 10-325 MG PO TABS: 1.0000 | ORAL_TABLET | Freq: Four times a day (QID) | ORAL | 0 refills | Status: DC | PRN

## 2023-04-14 NOTE — Telephone Encounter (Signed)
Patient calls nurse line requesting a refill on Oxycodone.   I do not see this on her medication list.   Will forward to PCP.   [facility] on [address].

## 2023-04-20 ENCOUNTER — Other Ambulatory Visit: Payer: Self-pay | Admitting: Cardiology

## 2023-04-20 DIAGNOSIS — I5022 Chronic systolic (congestive) heart failure: Secondary | ICD-10-CM

## 2023-04-27 ENCOUNTER — Other Ambulatory Visit: Payer: Self-pay | Admitting: Dermatology

## 2023-04-27 DIAGNOSIS — L409 Psoriasis, unspecified: Secondary | ICD-10-CM

## 2023-04-29 ENCOUNTER — Ambulatory Visit: Payer: 59

## 2023-04-29 VITALS — BP 114/64 | HR 86 | Ht 62.0 in | Wt 134.0 lb

## 2023-04-29 DIAGNOSIS — N3941 Urge incontinence: Secondary | ICD-10-CM | POA: Diagnosis not present

## 2023-04-29 LAB — POCT URINALYSIS DIP (MANUAL ENTRY)
Blood, UA: NEGATIVE
Glucose, UA: NEGATIVE mg/dL
Nitrite, UA: NEGATIVE
Protein Ur, POC: NEGATIVE mg/dL
Spec Grav, UA: >=1.03 — AB (ref 1.010–1.025)
Urobilinogen, UA: 0.2 U/dL
pH, UA: 5.5 (ref 5.0–8.0)

## 2023-04-29 NOTE — Patient Instructions (Signed)
It was great to see you today! Thank you for choosing [facility] for your primary care.  Today we addressed: Your urine does not appear consistent with a UTI.  Please avoid bladder irritants, see the extra form I printed out for you.  I am glad you have a urology appointment on Monday, I would recommend discussing the symptoms with them as they may need to make changes to your Myrbetriq.  If you haven't already, sign up for My Chart to have easy access to your labs results, and communication with your primary care physician.  Please arrive 15 minutes before your appointment to ensure smooth check in process.  We appreciate your efforts in making this happen.  Thank you for allowing me to participate in your care, Shelby Mattocks, DO 04/29/2023, 10:35 AM PGY-3, [facility]

## 2023-04-29 NOTE — Progress Notes (Signed)
  SUBJECTIVE:   CHIEF COMPLAINT / HPI:   Patient presents with 2 to 3-day concern of UTI-like symptoms.  She states she feels like she has to urinate every couple hours, denies new onset back pain and fevers.  She does not feel that after she voids that she is completely evacuating her bladder.  Further, she endorses dribbling of urine and unable to get to the bathroom in time.  She is having to wear pads.  During the beginning of the encounter, she denied any urinary history however she does see urology for urge incontinence and is on Myrbetriq.  States she has been stable on Myrbetriq for years.  She has an appointment with urology next week.  PERTINENT  PMH / PSH: T2DM, HLD, gastroparesis, HTN, HFrEF, urge incontinence  OBJECTIVE:  BP 114/64   Pulse 86   Ht 5\' 2"  (1.575 m)   Wt 134 lb (60.8 kg)   SpO2 98%   BMI 24.51 kg/m  General: Well-appearing, NAD MSK: No CVA tenderness Abdomen: Trace tenderness with suprapubic pressure  ASSESSMENT/PLAN:   Assessment & Plan Urge incontinence of urine UA with trace bacteria, negative nitrites.  Do not suspect UTI.  I do suspect that her urinary incontinence may have worsened and recommend that she follow-up with urology for potentially altering medication regimen involving her Myrbetriq.   Shelby Mattocks, DO 04/29/2023, 3:18 PM PGY-3, [facility]

## 2023-04-29 NOTE — Assessment & Plan Note (Addendum)
UA with trace bacteria, negative nitrites.  Do not suspect UTI.  I do suspect that her urinary incontinence may have worsened and recommend that she follow-up with urology for potentially altering medication regimen involving her Myrbetriq.

## 2023-05-01 DIAGNOSIS — Z882 Allergy status to sulfonamides status: Secondary | ICD-10-CM | POA: Diagnosis not present

## 2023-05-01 DIAGNOSIS — Z780 Asymptomatic menopausal state: Secondary | ICD-10-CM | POA: Diagnosis not present

## 2023-05-01 DIAGNOSIS — Z7982 Long term (current) use of aspirin: Secondary | ICD-10-CM | POA: Diagnosis not present

## 2023-05-01 DIAGNOSIS — Z881 Allergy status to other antibiotic agents status: Secondary | ICD-10-CM | POA: Diagnosis not present

## 2023-05-01 DIAGNOSIS — M059 Rheumatoid arthritis with rheumatoid factor, unspecified: Secondary | ICD-10-CM | POA: Diagnosis not present

## 2023-05-01 DIAGNOSIS — Z791 Long term (current) use of non-steroidal anti-inflammatories (NSAID): Secondary | ICD-10-CM | POA: Diagnosis not present

## 2023-05-01 DIAGNOSIS — Z5181 Encounter for therapeutic drug level monitoring: Secondary | ICD-10-CM | POA: Diagnosis not present

## 2023-05-01 DIAGNOSIS — Z79899 Other long term (current) drug therapy: Secondary | ICD-10-CM | POA: Diagnosis not present

## 2023-05-02 ENCOUNTER — Encounter: Payer: Self-pay | Admitting: Student

## 2023-05-05 ENCOUNTER — Other Ambulatory Visit: Payer: Self-pay

## 2023-05-05 DIAGNOSIS — G8929 Other chronic pain: Secondary | ICD-10-CM

## 2023-05-05 DIAGNOSIS — R1084 Generalized abdominal pain: Secondary | ICD-10-CM | POA: Diagnosis not present

## 2023-05-05 DIAGNOSIS — M059 Rheumatoid arthritis with rheumatoid factor, unspecified: Secondary | ICD-10-CM

## 2023-05-05 DIAGNOSIS — R8271 Bacteriuria: Secondary | ICD-10-CM | POA: Diagnosis not present

## 2023-05-05 DIAGNOSIS — N3000 Acute cystitis without hematuria: Secondary | ICD-10-CM | POA: Diagnosis not present

## 2023-05-06 MED ORDER — OXYCODONE-ACETAMINOPHEN 10-325 MG PO TABS
1.0000 | ORAL_TABLET | Freq: Four times a day (QID) | ORAL | 0 refills | Status: DC | PRN
Start: 2023-05-06 — End: 2023-06-02

## 2023-05-14 ENCOUNTER — Ambulatory Visit (HOSPITAL_COMMUNITY): Payer: 59 | Attending: Cardiology

## 2023-05-14 ENCOUNTER — Other Ambulatory Visit: Payer: 59

## 2023-05-14 DIAGNOSIS — I428 Other cardiomyopathies: Secondary | ICD-10-CM | POA: Diagnosis not present

## 2023-05-14 DIAGNOSIS — I5022 Chronic systolic (congestive) heart failure: Secondary | ICD-10-CM | POA: Diagnosis not present

## 2023-05-14 LAB — ECHOCARDIOGRAM COMPLETE
Area-P 1/2: 5.58 cm2
S' Lateral: 3 cm

## 2023-05-15 ENCOUNTER — Ambulatory Visit: Payer: Self-pay

## 2023-05-15 NOTE — Patient Instructions (Signed)
Visit Information  Thank you for taking time to visit with me today. Please don't hesitate to contact me if I can be of assistance to you.   Following are the goals we discussed today:   Goals Addressed               This Visit's Progress     Diabetes Patient stated goal- lower my A1C (pt-stated)        Patient Goals/Self Care Activities: -Patient/Caregiver will take medications as prescribed   -Patient/Caregiver will attend all scheduled provider appointments -Patient/Caregiver will call pharmacy for medication refills 3-7 days in advance of running out of medications -Patient/Caregiver will call provider office for new concerns or questions  -Patient/Caregiver will focus on medication adherence by taking medications as prescribed   Objective:  Lab Results  Component Value Date   HGBA1C 7.6 (A) 03/04/2023   Lab Results  Component Value Date   CREATININE 0.55 03/05/2023   CREATININE 0.56 11/26/2022   CREATININE 0.42 (L) 08/29/2022   Lab Results  Component Value Date   EGFR 102 08/16/2022    adherence to prescribed medication regimen       contacting provider for new or worsened symptoms or questions      Reviewed medications with patient and discussed importance of medication adherence Make sure to check blood sugars 2 hours after eating Make sure to get flu shot Go to podiatry appointment          Our next appointment is by telephone on 06/03/23 at 11 am  Please call the care guide team at [phone] if you need to cancel or reschedule your appointment.   If you are experiencing a Mental Health or Behavioral Health Crisis or need someone to talk to, please call 1-800-273-TALK (toll free, 24 hour hotline)  Patient verbalizes understanding of instructions and care plan provided today and agrees to view in MyChart. Active MyChart status and patient understanding of how to access instructions and care plan via MyChart confirmed with patient.     Juanell Fairly RN,  BSN, [facility]  [facility], [facility]  Care Coordinator Phone: [phone]

## 2023-05-15 NOTE — Patient Outreach (Signed)
  Care Coordination   Follow Up Visit Note   05/15/2023 Name: Michelle [name] MRN: [id number] DOB: 25-Feb-1951  Michelle [name] is a 72 y.o. year old female who sees Vonna Drafts, MD for primary care. I spoke with  [name] by phone today.  What matters to the patients health and wellness today?   I had a conversation with Michelle [name] today, during which she conveyed that she is currently in good health. Her knee is showing improvement, and her blood sugar levels have remained stable; today, the reading was 119. We discussed her vaccination status, and she informed me that she has received both her COVID and RSV vaccinations. Furthermore, she intends to obtain her flu vaccination at the pharmacy while collecting her medications today. Michelle [name] has a scheduled appointment with her podiatrist on December 18 for a diabetic foot examination. Additionally, she has a follow-up appointment with her primary care physician regarding her A1C levels on December 2. She reported that her eating and sleeping patterns are satisfactory in relation to her diabetes, and she expressed no additional questions at this time. Given that her condition appears stable, we have agreed that if her symptoms and diabetes continue to show improvement at her next visit, that will conclude our monitoring.    Goals Addressed               This Visit's Progress     Diabetes Patient stated goal- lower my A1C (pt-stated)        Patient Goals/Self Care Activities: -Patient/Caregiver will take medications as prescribed   -Patient/Caregiver will attend all scheduled provider appointments -Patient/Caregiver will call pharmacy for medication refills 3-7 days in advance of running out of medications -Patient/Caregiver will call provider office for new concerns or questions  -Patient/Caregiver will focus on medication adherence by taking medications as prescribed   Objective:  Lab Results  Component Value Date    HGBA1C 7.6 (A) 03/04/2023   Lab Results  Component Value Date   CREATININE 0.55 03/05/2023   CREATININE 0.56 11/26/2022   CREATININE 0.42 (L) 08/29/2022   Lab Results  Component Value Date   EGFR 102 08/16/2022    adherence to prescribed medication regimen       contacting provider for new or worsened symptoms or questions      Reviewed medications with patient and discussed importance of medication adherence Make sure to check blood sugars 2 hours after eating Make sure to get flu shot Go to podiatry appointment          SDOH assessments and interventions completed:  No     Care Coordination Interventions:  Yes, provided  . Interventions Today    Flowsheet Row Most Recent Value  Chronic Disease   Chronic disease during today's visit Diabetes  General Interventions   General Interventions Discussed/Reviewed General Interventions Discussed, Labs, Annual Foot Exam, Vaccines  Labs Hgb A1c every 3 months  Vaccines Flu, RSV, COVID-19  Pharmacy Interventions   Pharmacy Dicussed/Reviewed Pharmacy Topics Discussed  Safety Interventions   Safety Discussed/Reviewed Safety Discussed      .  Follow up plan: Follow up call scheduled for 06/03/23  11 am    Encounter Outcome:  Patient Visit Completed   Juanell Fairly RN, BSN, [facility] [place]  [facility], [facility]  Care Coordinator Phone: [phone]

## 2023-05-22 ENCOUNTER — Other Ambulatory Visit: Payer: Self-pay | Admitting: Family Medicine

## 2023-05-23 ENCOUNTER — Encounter: Payer: Self-pay | Admitting: Cardiology

## 2023-05-23 ENCOUNTER — Ambulatory Visit: Payer: 59 | Attending: Cardiology | Admitting: Cardiology

## 2023-05-23 VITALS — BP 104/64 | HR 89 | Resp 16 | Ht 62.0 in | Wt 136.0 lb

## 2023-05-23 DIAGNOSIS — I10 Essential (primary) hypertension: Secondary | ICD-10-CM

## 2023-05-23 DIAGNOSIS — I2510 Atherosclerotic heart disease of native coronary artery without angina pectoris: Secondary | ICD-10-CM

## 2023-05-23 DIAGNOSIS — I428 Other cardiomyopathies: Secondary | ICD-10-CM

## 2023-05-23 DIAGNOSIS — E1169 Type 2 diabetes mellitus with other specified complication: Secondary | ICD-10-CM

## 2023-05-23 DIAGNOSIS — I700 Atherosclerosis of aorta: Secondary | ICD-10-CM

## 2023-05-23 DIAGNOSIS — E785 Hyperlipidemia, unspecified: Secondary | ICD-10-CM | POA: Diagnosis not present

## 2023-05-23 DIAGNOSIS — Z87891 Personal history of nicotine dependence: Secondary | ICD-10-CM | POA: Diagnosis not present

## 2023-05-23 DIAGNOSIS — E119 Type 2 diabetes mellitus without complications: Secondary | ICD-10-CM

## 2023-05-23 DIAGNOSIS — I5022 Chronic systolic (congestive) heart failure: Secondary | ICD-10-CM | POA: Diagnosis not present

## 2023-05-23 MED ORDER — METOPROLOL SUCCINATE ER 50 MG PO TB24: 50.0000 mg | ORAL_TABLET | Freq: Every day | ORAL | 3 refills | Status: DC

## 2023-05-23 NOTE — Patient Instructions (Signed)
Medication Instructions:  Your physician has recommended you make the following change in your medication:   INCREASE Metoprolol Succinate (Toprol-XL) to 50 mg once daily   **continue same parameters of holding medication if systolic blood pressure (top number) is below 100 and/or if heart rate is less than 55   *If you need a refill on your cardiac medications before your next appointment, please call your pharmacy*  Lab Work: None ordered today. If you have labs (blood work) drawn today and your tests are completely normal, you will receive your results only by: MyChart Message (if you have MyChart) OR A paper copy in the mail If you have any lab test that is abnormal or we need to change your treatment, we will call you to review the results.  Testing/Procedures: None ordered today.  Follow-Up: At [facility], you and your health needs are our priority.  As part of our continuing mission to provide you with exceptional heart care, we have created designated Provider Care Teams.  These Care Teams include your primary Cardiologist (physician) and Advanced Practice Providers (APPs -  Physician Assistants and Nurse Practitioners) who all work together to provide you with the care you need, when you need it.  We recommend signing up for the patient portal called "MyChart".  Sign up information is provided on this After Visit Summary.  MyChart is used to connect with patients for Virtual Visits (Telemedicine).  Patients are able to view lab/test results, encounter notes, upcoming appointments, etc.  Non-urgent messages can be sent to your provider as well.   To learn more about what you can do with MyChart, go to [email or web address].    Your next appointment:   6 month(s)  The format for your next appointment:   In Person  Provider:   Tessa Lerner, DO {

## 2023-05-23 NOTE — Progress Notes (Signed)
Cardiology Office Note:  .   Date:  05/23/2023  ID:  Michelle [name], DOB September 11, 1950, MRN [id number] PCP:  Vonna Drafts, MD  Former Cardiology Providers: Dr. Yates Decamp [facility] Providers Cardiologist:  Tessa Lerner, DO , [facility] (established care 07/22/2022) Electrophysiologist:  None  Click to update primary MD,subspecialty MD or APP then REFRESH:1}    Chief Complaint  Patient presents with   heart failure with reduced ejection fraction   Follow-up    History of Present Illness: .   Michelle [name] is a 72 y.o. Caucasian female whose past medical history and cardiovascular risk factors includes: Nonischemic cardiomyopathy, chronic HFrEF, coronary artery calcification (CT scan care everywhere), aortic atherosclerosis, former smoker, non-insulin-dependent diabetes mellitus type 2, hyperlipidemia, hypertension.   Patient was referred to practice for evaluation and management of chronic HFrEF.  She underwent left and right heart catheterization and was noted to have no significant obstructive CAD and compensated nonischemic cardiomyopathy.  Since then her GDMT has been uptitrated to what she can tolerate.  SGLT2 inhibitors were not considered given her history of multiple yeast infections in the past.  Since last office visit she denies anginal chest pain or heart failure symptoms.  No use of sublingual nitroglycerin tablets. She had an elective right meniscus repair surgery without any postoperative cardiovascular complications.   Review of Systems: .   Review of Systems  Constitutional: Decreased appetite: .diaglistnonumbers.  Cardiovascular:  Negative for chest pain, claudication, irregular heartbeat, leg swelling, near-syncope, orthopnea, palpitations, paroxysmal nocturnal dyspnea and syncope.  Respiratory:  Negative for shortness of breath.   Hematologic/Lymphatic: Negative for bleeding problem.   Studies Reviewed:   EKG: EKG Interpretation Date/Time:  Friday May 23 2023 08:24:11 EST Text Interpretation: Normal sinus rhythm Normal ECG When compared with ECG of 07-Feb-2022 10:06, No significant change was found Confirmed by Tessa Lerner [id number]) on 05/23/2023 8:34:48 AM  Echocardiogram: 07/17/2022: LVEF 35-40%, global hypokinesis, grade 1 diastolic impairment, mild MR, estimated RAP 3 mmHg.  November 2024: LVEF 40-45%, global hypokinesis, diastolic dysfunction indeterminate. Right ventricular systolic size and function normal. No significant valvular heart disease. Estimated RAP 3 mmHg    Heart Catheterization: 07/30/2022: LM: Nonexistent. Dual ostial present to LAD and Lcx LAD: Mild medial calcification. No significant disease Lcx: No significant disease RCA: No significant disease   LVEDP 12 mmHg RA: 2 mmHg RV: 19/1 mmHg PA: 20/8 mmHg, mPAP 13 mmHg PCW: 3 mmHg   CO: 4.9 L/min CI: 3.1 L/min/m2   No oxygen step up on shunt run   Well compensated nonischemic cardiomyopathy   ABI 12/04/2021 Right: Resting right ankle-brachial index is within normal range. No evidence of significant right lower extremity arterial disease. The right toe-brachial index is normal.   Left: Resting left ankle-brachial index is within normal range. No evidence of significant left lower extremity arterial disease. The left toe-brachial index is normal.   RADIOLOGY: NA  Risk Assessment/Calculations:   NA   Labs:       Latest Ref Rng & Units 03/05/2023    8:34 AM 11/26/2022    9:00 AM 08/29/2022   10:25 AM  CBC  WBC 4.0 - 10.5 K/uL 8.5  13.9  9.4   Hemoglobin 12.0 - 15.0 g/dL 19.1  47.8  29.5   Hematocrit 36.0 - 46.0 % 37.3  36.2  34.9   Platelets 150 - 400 K/uL 211  233  281        Latest Ref Rng & Units 03/05/2023  8:34 AM 11/26/2022    9:00 AM 08/29/2022   10:25 AM  BMP  Glucose 70 - 99 mg/dL 295  284  132   BUN 8 - 23 mg/dL 11  11  13    Creatinine 0.44 - 1.00 mg/dL 4.40  1.02  7.25   Sodium 135 - 145 mmol/L 139  139  137   Potassium 3.5 -  5.1 mmol/L 4.5  4.0  4.4   Chloride 98 - 111 mmol/L 103  106  102   CO2 22 - 32 mmol/L 29  26  28    Calcium 8.9 - 10.3 mg/dL 9.4  8.9  9.3       Latest Ref Rng & Units 03/05/2023    8:34 AM 11/26/2022    9:00 AM 08/29/2022   10:25 AM  CMP  Glucose 70 - 99 mg/dL 366  440  347   BUN 8 - 23 mg/dL 11  11  13    Creatinine 0.44 - 1.00 mg/dL 4.25  9.56  3.87   Sodium 135 - 145 mmol/L 139  139  137   Potassium 3.5 - 5.1 mmol/L 4.5  4.0  4.4   Chloride 98 - 111 mmol/L 103  106  102   CO2 22 - 32 mmol/L 29  26  28    Calcium 8.9 - 10.3 mg/dL 9.4  8.9  9.3   Total Protein 6.5 - 8.1 g/dL 6.6  6.5  6.7   Total Bilirubin 0.3 - 1.2 mg/dL 0.5  0.4  0.3   Alkaline Phos 38 - 126 U/L 55  61  88   AST 15 - 41 U/L 15  13  19    ALT 0 - 44 U/L 12  13  14      Lab Results  Component Value Date   CHOL 117 08/26/2022   HDL 41 08/26/2022   LDLCALC 54 08/26/2022   LDLDIRECT 43 06/01/2020   TRIG 122 08/26/2022   CHOLHDL 2.9 08/26/2022   No results for input(s): "LIPOA" in the last 8760 hours. No components found for: "NTPROBNP" Recent Labs    07/22/22 1054 08/16/22 1144  PROBNP 80 44   No results for input(s): "TSH" in the last 8760 hours.  Physical Exam:    Today's Vitals   05/23/23 0819  BP: 104/64  Pulse: 89  Resp: 16  SpO2: 93%  Weight: 136 lb (61.7 kg)  Height: 5\' 2"  (1.575 m)   Body mass index is 24.87 kg/m. Wt Readings from Last 3 Encounters:  05/23/23 136 lb (61.7 kg)  04/29/23 134 lb (60.8 kg)  03/26/23 132 lb (59.9 kg)    Physical Exam   Impression & Recommendation(s):  Impression:   ICD-10-CM   1. Chronic HFrEF (heart failure with reduced ejection fraction) (HCC)  I50.22 EKG 12-Lead    metoprolol succinate (TOPROL-XL) 50 MG 24 hr tablet    2. Nonischemic cardiomyopathy (HCC)  I42.8 EKG 12-Lead    metoprolol succinate (TOPROL-XL) 50 MG 24 hr tablet    3. Coronary artery calcification seen on computed tomography  I25.10     4. Aortic atherosclerosis (HCC)  I70.0      5. Non-insulin dependent type 2 diabetes mellitus (HCC)  E11.9     6. Type 2 diabetes mellitus with hyperlipidemia (HCC)  E11.69    E78.5     7. Benign hypertension  I10     8. Former smoker  Z87.891        Recommendation(s):  Chronic HFrEF (heart failure  with reduced ejection fraction) (HCC) Nonischemic cardiomyopathy (HCC) Stage B, NYHA class II. Discovered in January 2024. Denies anginal chest pain or heart failure symptoms. Most recent angiography notes nonobstructive CAD with compensated nonischemic cardiomyopathy. Echocardiogram since last office visit notes improvement in LV EF, 40 to 45%.  Will hold off on cardiac MRI at this time. Continue Entresto 24/26 mg p.o. twice daily. Currently on Toprol-XL 25 mg p.o. daily will transition her to 50 mg with holding parameters. Not on SGLT2 inhibitors given her history of multiple yeast infections Continue statin therapy. Diabetes management per primary team  Coronary artery calcification seen on computed tomography Aortic atherosclerosis (HCC) Denies anginal chest pain. Has undergone appropriate ischemic workup. Continue Crestor 20 mg p.o. daily  Non-insulin dependent type 2 diabetes mellitus (HCC) Type 2 diabetes mellitus with hyperlipidemia (HCC) Reemphasized importance of glycemic control. Most recent hemoglobin A1c 7.6 as of September 2024. Currently on Trulicity -recommend transitioning to Ozempic given her history of chronic HFrEF/nonischemic cardiomyopathy/diabetes patient will discuss with her primary provider  Benign hypertension Office blood pressures are soft, but patient is asymptomatic. Medications reconciled as noted above. Monitor clinically  Orders Placed:  Orders Placed This Encounter  Procedures   EKG 12-Lead    Final Medication List:    Meds ordered this encounter  Medications   metoprolol succinate (TOPROL-XL) 50 MG 24 hr tablet    Sig: Take 1 tablet (50 mg total) by mouth daily. Take with or  immediately following a meal.    Dispense:  90 tablet    Refill:  3    Increasing dose to 50 mg    Medications Discontinued During This Encounter  Medication Reason   metoprolol succinate (TOPROL-XL) 25 MG 24 hr tablet Dose change     Current Outpatient Medications:    Accu-Chek Softclix Lancets lancets, Please use to check blood sugar up to two times per day. E11.42, Disp: 200 each, Rfl: 4   Blood Glucose Monitoring Suppl (ONE TOUCH ULTRA 2) w/Device KIT, USE AS DIRECTED, Disp: 1 kit, Rfl: 0   cetirizine (ZYRTEC) 10 MG tablet, Take 10 mg by mouth daily., Disp: , Rfl:    clobetasol (TEMOVATE) 0.05 % external solution, APPLY 1 APPLICATION TOPICALLY DAILY TO SCALP UP TO 5 TIMES PER WEEK, Disp: 50 mL, Rfl: 1   cromolyn (OPTICROM) 4 % ophthalmic solution, Place 1 drop into both eyes 3 (three) times daily as needed (itchy eyes)., Disp: , Rfl:    Cyanocobalamin (B-12) 5000 MCG CAPS, Take 5,000 mcg by mouth daily., Disp: , Rfl:    diclofenac Sodium (VOLTAREN) 1 % GEL, Apply 2 g topically 4 (four) times daily as needed (pain)., Disp: , Rfl:    diphenhydramine-acetaminophen (TYLENOL PM) 25-500 MG TABS tablet, Take 2 tablets by mouth at bedtime as needed (sleep)., Disp: , Rfl:    Dulaglutide (TRULICITY) 3 MG/0.5ML SOPN, Inject 3 mg as directed once a week., Disp: 2 mL, Rfl: 2   Dulaglutide 3 MG/0.5ML SOPN, Inject 3 mg into the skin once a week., Disp: 2 mL, Rfl: 2   DULERA 100-5 MCG/ACT AERO, Inhale 2 puffs into the lungs 2 (two) times daily., Disp: , Rfl:    fluticasone (FLONASE) 50 MCG/ACT nasal spray, Place 2 sprays into both nostrils daily as needed for allergies., Disp: 144 mL, Rfl: 1   gabapentin (NEURONTIN) 300 MG capsule, TAKE 1 CAPSULE BY MOUTH EVERYDAY AT BEDTIME, Disp: 90 capsule, Rfl: 3   glucose blood test strip, Use as instructed a few times a  week as needed, Disp: 100 each, Rfl: 12   glucose blood test strip, Use to check blood glucose 2 times a day as needed, Disp: 100 each, Rfl: 12    leflunomide (ARAVA) 10 MG tablet, Take 10 mg by mouth daily., Disp: , Rfl:    meloxicam (MOBIC) 15 MG tablet, Take 1 tablet (15 mg total) by mouth daily., Disp: 30 tablet, Rfl: 2   metFORMIN (GLUCOPHAGE-XR) 500 MG 24 hr tablet, TAKE 2 TABLETS BY MOUTH TWICE A DAY, Disp: 360 tablet, Rfl: 1   metoprolol succinate (TOPROL-XL) 50 MG 24 hr tablet, Take 1 tablet (50 mg total) by mouth daily. Take with or immediately following a meal., Disp: 90 tablet, Rfl: 3   mirabegron ER (MYRBETRIQ) 25 MG TB24 tablet, Take 25 mg by mouth daily., Disp: , Rfl:    mometasone (ELOCON) 0.1 % cream, APPLY 1 APPLICATION TOPICALLY DAILY AS NEEDED (RASH)., Disp: 45 g, Rfl: 2   montelukast (SINGULAIR) 10 MG tablet, TAKE 1 TABLET BY MOUTH EVERY DAY (Patient taking differently: Take 10 mg by mouth at bedtime.), Disp: 30 tablet, Rfl: 0   mupirocin ointment (BACTROBAN) 2 %, APPLY ONCE DAILY TO OPEN SORES ON ARMS, LEGS, TOES UNTIL HEALED, THEN APPLY AS NEEDED FOR FLARES (Patient taking differently: Apply 1 Application topically daily as needed (wound care).), Disp: 22 g, Rfl: 0   nitroGLYCERIN (NITROSTAT) 0.4 MG SL tablet, Place 1 tablet (0.4 mg total) under the tongue every 5 (five) minutes as needed., Disp: 25 tablet, Rfl: 1   omeprazole (PRILOSEC) 40 MG capsule, Take 1 capsule (40 mg total) by mouth in the morning., Disp: 90 capsule, Rfl: 1   oxyCODONE-acetaminophen (PERCOCET) 10-325 MG tablet, Take 1 tablet by mouth every 6 (six) hours as needed for pain., Disp: 120 tablet, Rfl: 0   rosuvastatin (CRESTOR) 10 MG tablet, Take 1 tablet (10 mg total) by mouth daily. (Patient taking differently: Take 10 mg by mouth every evening.), Disp: 90 tablet, Rfl: 3   sacubitril-valsartan (ENTRESTO) 24-26 MG, Take 1 tablet by mouth twice daily, Disp: 180 tablet, Rfl: 1   sertraline (ZOLOFT) 50 MG tablet, TAKE 1 TABLET BY MOUTH EVERY DAY, Disp: 90 tablet, Rfl: 3   Thiamine HCl (VITAMIN B-1) 250 MG tablet, Take 250 mg by mouth daily., Disp: , Rfl:     VENTOLIN HFA 108 (90 Base) MCG/ACT inhaler, INHALE 2 PUFFS BY MOUTH EVERY 4 HOURS AS NEEDED FOR WHEEZING OR SHORTNESS OF BREATH., Disp: 108 each, Rfl: 11  Consent:   N/A  Disposition:   6 months for CHF management Patient may be asked to follow-up sooner based on the results of the above-mentioned testing.  Her questions and concerns were addressed to her satisfaction. She voices understanding of the recommendations provided during this encounter.    Signed, Tessa Lerner, DO, [facility]  [facility]  [address] [place], [place] [address] 05/23/2023 8:55 AM

## 2023-05-27 ENCOUNTER — Other Ambulatory Visit: Payer: Self-pay | Admitting: Dermatology

## 2023-05-27 DIAGNOSIS — T148XXA Other injury of unspecified body region, initial encounter: Secondary | ICD-10-CM

## 2023-05-28 ENCOUNTER — Other Ambulatory Visit: Payer: Self-pay | Admitting: Family Medicine

## 2023-05-28 DIAGNOSIS — E1142 Type 2 diabetes mellitus with diabetic polyneuropathy: Secondary | ICD-10-CM

## 2023-05-28 DIAGNOSIS — M5416 Radiculopathy, lumbar region: Secondary | ICD-10-CM | POA: Diagnosis not present

## 2023-06-02 ENCOUNTER — Ambulatory Visit (INDEPENDENT_AMBULATORY_CARE_PROVIDER_SITE_OTHER): Payer: 59 | Admitting: Family Medicine

## 2023-06-02 ENCOUNTER — Encounter: Payer: Self-pay | Admitting: Family Medicine

## 2023-06-02 VITALS — BP 114/81 | HR 86 | Ht 62.0 in | Wt 135.5 lb

## 2023-06-02 DIAGNOSIS — E1142 Type 2 diabetes mellitus with diabetic polyneuropathy: Secondary | ICD-10-CM | POA: Diagnosis not present

## 2023-06-02 DIAGNOSIS — M059 Rheumatoid arthritis with rheumatoid factor, unspecified: Secondary | ICD-10-CM

## 2023-06-02 DIAGNOSIS — G8929 Other chronic pain: Secondary | ICD-10-CM | POA: Diagnosis not present

## 2023-06-02 DIAGNOSIS — Z23 Encounter for immunization: Secondary | ICD-10-CM

## 2023-06-02 DIAGNOSIS — J45909 Unspecified asthma, uncomplicated: Secondary | ICD-10-CM | POA: Diagnosis not present

## 2023-06-02 LAB — POCT GLYCOSYLATED HEMOGLOBIN (HGB A1C): HbA1c, POC (controlled diabetic range): 8.2 % — AB (ref 0.0–7.0)

## 2023-06-02 MED ORDER — OXYCODONE-ACETAMINOPHEN 10-325 MG PO TABS: 1.0000 | ORAL_TABLET | Freq: Four times a day (QID) | ORAL | 0 refills | Status: DC | PRN

## 2023-06-02 MED ORDER — TRULICITY 3 MG/0.5ML ~~LOC~~ SOAJ: 3.0000 mg | SUBCUTANEOUS | 2 refills | Status: DC

## 2023-06-02 MED ORDER — VENTOLIN HFA 108 (90 BASE) MCG/ACT IN AERS
INHALATION_SPRAY | RESPIRATORY_TRACT | 11 refills | Status: AC
Start: 2023-06-02 — End: ?

## 2023-06-02 NOTE — Assessment & Plan Note (Signed)
A1c increased from 7.6 to 8.2 today.  However, this is in the setting of recent prednisone use due to her rheumatoid arthritis.  Upon shared decision making with the patient we decided to continue her current regimen of Trulicity 3 mg weekly and metformin 1000 mg twice daily and continue to monitor her A1c closely to see if it comes down after being off of prednisone.  Unable to do SGLT2's because of yeast infections so insulin would be the next step.  At next visit may also consider switching from Trulicity to [facility] per cardiology recommendations.

## 2023-06-02 NOTE — Patient Instructions (Signed)
Please continue your current medications and follow up in 3 months

## 2023-06-02 NOTE — Progress Notes (Signed)
    SUBJECTIVE:   CHIEF COMPLAINT / HPI:   T2DM -Current medication regimen: Trulicity 3 mg weekly, metformin 1000 mg twice daily. Not on SGLT2 due to hx of yeast infections. On Crestor 20mg  daily. -Notably, pt was advised by her cardiologist to discuss switching from trulicity to ozempic due to hx of HFrEF/NICM -Pt has been on a 6 week course of prednisone for arthritis - finishes this course next Wednesday  -Home CBGs: usually runs in 110s -Denies polyuria, polydipsia, abdominal pain, chest pain, shortness of breath, numbness/tingling in extremities, vision changes  Lab Results  Component Value Date   HGBA1C 8.2 (A) 06/02/2023   HGBA1C 7.6 (A) 03/04/2023   HGBA1C 6.6 11/26/2022    PERTINENT  PMH / PSH: T2DM, RA  OBJECTIVE:   BP 114/81   Pulse 86   Ht 5\' 2"  (1.575 m)   Wt 135 lb 8 oz (61.5 kg)   SpO2 98%   BMI 24.78 kg/m   General: NAD, pleasant, able to participate in exam Cardiac: RRR, no murmurs auscultated Respiratory: CTAB, normal WOB Abdomen: soft, non-tender, non-distended, normoactive bowel sounds Extremities: warm and well perfused, no edema or cyanosis Skin: warm and dry, no rashes noted Neuro: alert, no obvious focal deficits, speech normal Psych: Normal affect and mood  ASSESSMENT/PLAN:   Assessment & Plan Type 2 diabetes mellitus with diabetic polyneuropathy, without long-term current use of insulin (HCC) A1c increased from 7.6 to 8.2 today.  However, this is in the setting of recent prednisone use due to her rheumatoid arthritis.  Upon shared decision making with the patient we decided to continue her current regimen of Trulicity 3 mg weekly and metformin 1000 mg twice daily and continue to monitor her A1c closely to see if it comes down after being off of prednisone.  Unable to do SGLT2's because of yeast infections so insulin would be the next step.  At next visit may also consider switching from Trulicity to [facility] per cardiology recommendations.  Sent  refills for Percocet and Ventolin Flu shot today  Vonna Drafts, MD [facility]

## 2023-06-03 ENCOUNTER — Ambulatory Visit: Payer: Self-pay

## 2023-06-03 NOTE — Patient Outreach (Signed)
  Care Coordination   Follow Up Visit Note   06/03/2023 Name: Michelle [name] MRN: [id number] DOB: [birth year]  Michelle [name] is a 72 y.o. year old female who sees Michelle Drafts, MD for primary care. I spoke with  Michelle [name] by phone today.  What matters to the patients health and wellness today?  During my conversation with Michelle [name], she mentioned that she is feeling okay. She has been on Prednisone for about six weeks due to her arthritis. Her last A1C level was 8.5, and her doctor believes this elevation is linked to her prolonged use of Prednisone. Michelle [name] noted that her blood sugar levels have been ranging from 117 to 120. She feels that once she is no longer taking Prednisone, her A1C levels will decrease. She continues to take her medications and monitor her food intake.     Goals Addressed               This Visit's Progress     Diabetes Patient stated goal- lower my A1C (pt-stated)        Patient Goals/Self Care Activities: -Patient/Caregiver will take medications as prescribed   -Patient/Caregiver will attend all scheduled provider appointments -Patient/Caregiver will call pharmacy for medication refills 3-7 days in advance of running out of medications -Patient/Caregiver will call provider office for new concerns or questions  -Patient/Caregiver will focus on medication adherence by taking medications as prescribed   Objective:  Lab Results  Component Value Date   HGBA1C 8.2 (A) 06/02/2023   Lab Results  Component Value Date   CREATININE 0.55 03/05/2023   CREATININE 0.56 11/26/2022   CREATININE 0.42 (L) 08/29/2022   Lab Results  Component Value Date   EGFR 102 08/16/2022    adherence to prescribed medication regimen       contacting provider for new or worsened symptoms or questions      Reviewed medications with patient and discussed importance of medication adherence Make sure to check blood sugars 2 hours after eating           SDOH  assessments and interventions completed:  No     Care Coordination Interventions:  Yes, provided   Follow up plan: Follow up call scheduled for 07/08/23  1030 am    Encounter Outcome:  Patient Visit Completed   Michelle Fairly RN, BSN, [facility]  [facility], [facility]  Care Coordinator Phone: [phone]

## 2023-06-03 NOTE — Patient Instructions (Signed)
Visit Information  Thank you for taking time to visit with me today. Please don't hesitate to contact me if I can be of assistance to you.   Following are the goals we discussed today:   Goals Addressed               This Visit's Progress     Diabetes Patient stated goal- lower my A1C (pt-stated)        Patient Goals/Self Care Activities: -Patient/Caregiver will take medications as prescribed   -Patient/Caregiver will attend all scheduled provider appointments -Patient/Caregiver will call pharmacy for medication refills 3-7 days in advance of running out of medications -Patient/Caregiver will call provider office for new concerns or questions  -Patient/Caregiver will focus on medication adherence by taking medications as prescribed   Objective:  Lab Results  Component Value Date   HGBA1C 8.2 (A) 06/02/2023   Lab Results  Component Value Date   CREATININE 0.55 03/05/2023   CREATININE 0.56 11/26/2022   CREATININE 0.42 (L) 08/29/2022   Lab Results  Component Value Date   EGFR 102 08/16/2022    adherence to prescribed medication regimen       contacting provider for new or worsened symptoms or questions      Reviewed medications with patient and discussed importance of medication adherence Make sure to check blood sugars 2 hours after eating           Our next appointment is by telephone on 07/08/23 at 1030 am  Please call the care guide team at [phone] if you need to cancel or reschedule your appointment.   If you are experiencing a Mental Health or Behavioral Health Crisis or need someone to talk to, please call 1-800-273-TALK (toll free, 24 hour hotline)  Patient verbalizes understanding of instructions and care plan provided today and agrees to view in MyChart. Active MyChart status and patient understanding of how to access instructions and care plan via MyChart confirmed with patient.     Juanell Fairly RN, BSN, [facility] [place]  [facility],  [facility]  Care Coordinator Phone: [phone]

## 2023-06-04 ENCOUNTER — Other Ambulatory Visit: Payer: Self-pay | Admitting: *Deleted

## 2023-06-04 ENCOUNTER — Inpatient Hospital Stay: Payer: 59 | Attending: Hematology and Oncology

## 2023-06-04 DIAGNOSIS — M069 Rheumatoid arthritis, unspecified: Secondary | ICD-10-CM | POA: Insufficient documentation

## 2023-06-04 DIAGNOSIS — Z79899 Other long term (current) drug therapy: Secondary | ICD-10-CM | POA: Insufficient documentation

## 2023-06-04 DIAGNOSIS — Z87891 Personal history of nicotine dependence: Secondary | ICD-10-CM | POA: Diagnosis not present

## 2023-06-04 DIAGNOSIS — Z7969 Long term (current) use of other immunomodulators and immunosuppressants: Secondary | ICD-10-CM | POA: Insufficient documentation

## 2023-06-04 DIAGNOSIS — D500 Iron deficiency anemia secondary to blood loss (chronic): Secondary | ICD-10-CM

## 2023-06-04 DIAGNOSIS — D509 Iron deficiency anemia, unspecified: Secondary | ICD-10-CM | POA: Diagnosis not present

## 2023-06-04 LAB — CBC WITH DIFFERENTIAL (CANCER CENTER ONLY)
Abs Immature Granulocytes: 0.05 10*3/uL (ref 0.00–0.07)
Basophils Absolute: 0 10*3/uL (ref 0.0–0.1)
Basophils Relative: 0 %
Eosinophils Absolute: 0.1 10*3/uL (ref 0.0–0.5)
Eosinophils Relative: 2 %
HCT: 37.3 % (ref 36.0–46.0)
Hemoglobin: 12.2 g/dL (ref 12.0–15.0)
Immature Granulocytes: 1 %
Lymphocytes Relative: 29 %
Lymphs Abs: 2.5 10*3/uL (ref 0.7–4.0)
MCH: 32.5 pg (ref 26.0–34.0)
MCHC: 32.7 g/dL (ref 30.0–36.0)
MCV: 99.5 fL (ref 80.0–100.0)
Monocytes Absolute: 0.8 10*3/uL (ref 0.1–1.0)
Monocytes Relative: 9 %
Neutro Abs: 5.3 10*3/uL (ref 1.7–7.7)
Neutrophils Relative %: 59 %
Platelet Count: 222 10*3/uL (ref 150–400)
RBC: 3.75 MIL/uL — ABNORMAL LOW (ref 3.87–5.11)
RDW: 13 % (ref 11.5–15.5)
WBC Count: 8.8 10*3/uL (ref 4.0–10.5)
nRBC: 0 % (ref 0.0–0.2)

## 2023-06-04 LAB — CMP (CANCER CENTER ONLY)
ALT: 9 U/L (ref 0–44)
AST: 10 U/L — ABNORMAL LOW (ref 15–41)
Albumin: 3.9 g/dL (ref 3.5–5.0)
Alkaline Phosphatase: 58 U/L (ref 38–126)
Anion gap: 7 (ref 5–15)
BUN: 16 mg/dL (ref 8–23)
CO2: 27 mmol/L (ref 22–32)
Calcium: 9.5 mg/dL (ref 8.9–10.3)
Chloride: 104 mmol/L (ref 98–111)
Creatinine: 0.61 mg/dL (ref 0.44–1.00)
GFR, Estimated: >60 mL/min (ref >60)
Glucose, Bld: 165 mg/dL — ABNORMAL HIGH (ref 70–99)
Potassium: 4.3 mmol/L (ref 3.5–5.1)
Sodium: 138 mmol/L (ref 135–145)
Total Bilirubin: 0.4 mg/dL (ref <1.2)
Total Protein: 6.5 g/dL (ref 6.5–8.1)

## 2023-06-04 LAB — RETIC PANEL
Immature Retic Fract: 10.9 % (ref 2.3–15.9)
RBC.: 3.66 MIL/uL — ABNORMAL LOW (ref 3.87–5.11)
Retic Count, Absolute: 68.8 10*3/uL (ref 19.0–186.0)
Retic Ct Pct: 1.9 % (ref 0.4–3.1)
Reticulocyte Hemoglobin: 33.8 pg (ref >27.9)

## 2023-06-04 LAB — IRON AND IRON BINDING CAPACITY (CC-WL,HP ONLY)
Iron: 132 ug/dL (ref 28–170)
Saturation Ratios: 40 % — ABNORMAL HIGH (ref 10.4–31.8)
TIBC: 328 ug/dL (ref 250–450)
UIBC: 196 ug/dL (ref 148–442)

## 2023-06-04 LAB — FERRITIN: Ferritin: 215 ng/mL (ref 11–307)

## 2023-06-10 NOTE — Progress Notes (Unsigned)
[facility] [phone]   [phone]  PROGRESS NOTE  Patient Care Team: Vonna Drafts, MD as PCP - General (Family Medicine) Tessa Lerner, DO as PCP - Cardiology (Cardiology) Glendale Chard, DO as Consulting Physician (Neurology) Juanell Fairly, RN as [facility] Care Management Ricky Stabs, RN as [employer] [facility] [employer] Care Management (General Practice)  Hematological/Oncological History # Iron Deficiency Anemia of Unclear Etiology # Easy Bruising 07/30/2022: Hgb 10.5 08/29/2022: establish care with Dr. Leonides Schanz  09/12/2022-10/10/2022: IV iron sucrose 200 mg x 5 doses.  12/04/2022: White blood cell 13.9, hemoglobin 12.1, MCV 94.8, and platelets of 233  Interval History:  Michelle [name] 72 y.o. female with medical history significant for deficiency anemia who presents for a follow up visit. The patient's last visit was on 12/04/2022. In the interim since the last visit she has had no major changes in her health.  On exam today Michelle [name] reports she has had some issues with urinary tract infections in the interim since her last visit.  She also is having problems with her arthritis and feels like she is "getting old".  Her energy is about 3 out of 10.  She reports that she "lays on the couch a lot".  She has been having some dizziness but no lightheadedness or shortness of breath.  She reports he did have some blood in her urine but it was microscopic.  She reports that she does have some occasional back discomfort but no signs of UTI at this time.  She notes that she is taking B1 and B12 but no iron pills or multivitamin.  She is eating red meat, approximately daily.  Otherwise she denies any fevers, chills, sweats, nausea, vomiting or diarrhea.  A full 10 point ROS is otherwise negative.  MEDICAL HISTORY:  Past Medical History:  Diagnosis Date   Actinic keratosis    Allergic rhinitis 08/28/2006   Anemia    low iron   Anginal pain (HCC)    Asthma,  chronic 03/14/2010   Does seem to use her albuterol excessively. Consider change to more manageable medicines and treat more like COPD in the future.   Carpal tunnel syndrome, left 05/29/2009   Last Assessment & Plan:  Advised to wear wrist brace at night when she is sleeping.  If this does not help will consider having her go to sports medicine.   CHF (congestive heart failure) (HCC) 07/2022   HFrEF, NYHA Class I   Chronic frontal sinusitis 08/28/2016   Chronic left maxillary sinusitis 08/28/2016   Diverticulosis    Essential hypertension 10/30/2012   Gastroparesis 11/19/2011   DM and chronic narcotics contribute.  Causes dysphagia like symptoms    Generalized anxiety disorder 08/28/2006   GERD (gastroesophageal reflux disease)    Hearing loss 07/19/2015   Hearing aid in right ear   History of cataracts, bilateral    Hyperlipidemia 08/28/2006   Lumbar radiculopathy 04/07/2018   Lung nodules    Nonischemic cardiomyopathy (HCC)    Osteoporosis 08/08/2011   Pain in joint, pelvic region and thigh 11/18/2012   Radial styloid tenosynovitis 08/15/2013   Restless leg syndrome 05/14/2012   Seropositive rheumatoid arthritis 01/08/2010   Patient is being followed by rheumatologist at [facility] patient is on Plaquenil and as well as prednisone.   Changed to methotrexate and prednisone in May of 2013  Patient does have a pain contract here [facility] receives oxycodone 10/325 mg tabs every 6 hours. This  has been titrated down from significant doses of methadone previously. Could attempt to tit   Shoulder joint pain 03/25/2011   The patient is compensating due to her not being able to use her left arm right now rotator cuff impingement if he continues to worsen would not be surprised if she does get a tear on this side as well   Squamous cell carcinoma in situ of skin 11/25/2014   Referred to dermatology for excision    Squamous cell carcinoma of skin  12/26/2014   Right distal pretibial. SCC-KA pattern.   Squamous cell carcinoma of skin 03/27/2015   Right inf. lat. knee. SCC-KA pattern.    Squamous cell carcinoma of skin 10/07/2016   Left lateral calf superior. WD SCC.   Squamous cell carcinoma of skin 10/07/2016   Left lateral calf inferior. WD SCC.   Squamous cell carcinoma of skin 04/28/2018   Left mid med. pretibial. WD SCC.   Squamous cell carcinoma of skin 08/12/2019   Left mid med. pretibial sup. KA type. EDC   Squamous cell carcinoma of skin 08/12/2019   Left mid med. pretibial inferior. WD. EDC.   Tear of medial meniscus of knee, left 04/27/2009   Type II diabetes mellitus 08/28/2006   Overview:  Patient has her ups and downs, go in line with flares of her RA and prednisone uses.  Patient does have hx of hypoglycemic events so would not try for perfect control but should have A1c goal around 7.0 Lab Results  Component Value Date   HGBA1C 6.9 12/27/2011   Last Assessment & Plan:  At goal. Will continue current metformin    SURGICAL HISTORY: Past Surgical History:  Procedure Laterality Date   ABDOMINAL HYSTERECTOMY     APPENDECTOMY     BACK SURGERY     BIOPSY  05/10/2021   Procedure: BIOPSY;  Surgeon: Rachael Fee, MD;  Location: [facility];  Service: Endoscopy;;  EGD and COLON   CARPAL TUNNEL RELEASE Right    CATARACT EXTRACTION Left    with lid lift   CHOLECYSTECTOMY     COLONOSCOPY     COLONOSCOPY WITH PROPOFOL N/A 05/10/2021   Procedure: COLONOSCOPY WITH PROPOFOL;  Surgeon: Rachael Fee, MD;  Location: [facility];  Service: Endoscopy;  Laterality: N/A;   ESOPHAGOGASTRODUODENOSCOPY (EGD) WITH PROPOFOL N/A 05/10/2021   Procedure: ESOPHAGOGASTRODUODENOSCOPY (EGD) WITH PROPOFOL;  Surgeon: Rachael Fee, MD;  Location: [facility];  Service: Endoscopy;  Laterality: N/A;   HERNIA REPAIR     KNEE ARTHROSCOPY WITH MEDIAL MENISECTOMY Right 03/26/2023   Procedure: KNEE ARTHROSCOPY WITH MEDIAL MENISECTOMY;   Surgeon: Bjorn Pippin, MD;  Location: [facility];  Service: Orthopedics;  Laterality: Right;   KNEE SURGERY Right    x2- cartilage annd scar tissue   LEFT AND RIGHT HEART CATHETERIZATION WITH CORONARY ANGIOGRAM N/A 08/17/2013   Procedure: LEFT AND RIGHT HEART CATHETERIZATION WITH CORONARY ANGIOGRAM;  Surgeon: Pamella Pert, MD;  Location: [facility] CATH LAB;  Service: Cardiovascular;  Laterality: N/A;   lumbar back surgery  04/04/2018   done by neurosurgeron   right foot bone spurs removed     RIGHT/LEFT HEART CATH AND CORONARY ANGIOGRAPHY N/A 07/30/2022   Procedure: RIGHT/LEFT HEART CATH AND CORONARY ANGIOGRAPHY;  Surgeon: Elder Negus, MD;  Location: [facility] INVASIVE CV LAB;  Service: Cardiovascular;  Laterality: N/A;   ROTATOR CUFF REPAIR Right    SHOULDER SURGERY Left    x2- rotatar cuff tear   SYNOVECTOMY Right 03/26/2023  Procedure: SYNOVECTOMY;  Surgeon: Bjorn Pippin, MD;  Location: [facility];  Service: Orthopedics;  Laterality: Right;   UPPER GASTROINTESTINAL ENDOSCOPY      SOCIAL HISTORY: Social History   Socioeconomic History   Marital status: Married    Spouse name: Warehouse manager   Number of children: 3   Years of education: 9   Highest education level: 9th grade  Occupational History   Occupation: Retired/disabled    Associate Professor: UNEMPLOYED  Tobacco Use   Smoking status: Former    Current packs/day: 0.00    Average packs/day: 1.5 packs/day for 5.0 years (7.5 ttl pk-yrs)    Types: Cigarettes    Start date: 05/31/1989    Quit date: 05/31/1994    Years since quitting: 29.0    Passive exposure: Past   Smokeless tobacco: Never  Vaping Use   Vaping status: Never Used  Substance and Sexual Activity   Alcohol use: No    Alcohol/week: 0.0 standard drinks of alcohol   Drug use: No   Sexual activity: Not on file  Other Topics Concern   Not on file  Social History Narrative   Pt is on [employer] program [place]   Lives with husband and granddgt.   Lots of family stressors including  special-needs grandaughter who lives with them.      Advance Directives: None - Patient's plan is that her husband will make decisions for her if she were not capable of making informed decisions for herself. (Discussion with Tawanna Cooler McDiarmid, MD 12/11/18)   Full Code: Desires CPR/ACLS  (Discussion with Tawanna Cooler McDiarmid, MD 12/11/18)         Social Determinants of Health   Financial Resource Strain: Low Risk  (09/13/2022)   Overall Financial Resource Strain (CARDIA)    Difficulty of Paying Living Expenses: Not hard at all  Food Insecurity: No Food Insecurity (09/13/2022)   Hunger Vital Sign    Worried About Running Out of Food in the Last Year: Never true    Ran Out of Food in the Last Year: Never true  Transportation Needs: No Transportation Needs (09/13/2022)   PRAPARE - Administrator, Civil Service (Medical): No    Lack of Transportation (Non-Medical): No  Physical Activity: Inactive (09/13/2022)   Exercise Vital Sign    Days of Exercise per Week: 0 days    Minutes of Exercise per Session: 0 min  Stress: No Stress Concern Present (09/13/2022)   [employer] of Occupational Health - Occupational Stress Questionnaire    Feeling of Stress : Not at all  Social Connections: Moderately Isolated (09/13/2022)   Social Connection and Isolation Panel [NHANES]    Frequency of Communication with Friends and Family: More than three times a week    Frequency of Social Gatherings with Friends and Family: More than three times a week    Attends Religious Services: Never    Database administrator or Organizations: No    Attends Banker Meetings: Never    Marital Status: Married  Catering manager Violence: Not At Risk (09/13/2022)   Humiliation, Afraid, Rape, and Kick questionnaire    Fear of Current or Ex-Partner: No    Emotionally Abused: No    Physically Abused: No    Sexually Abused: No    FAMILY HISTORY: Family History  Problem Relation Age of Onset   Stroke  Father 68   Colon cancer Maternal Aunt    Tremor Mother    Lung cancer Brother 44  Rheum arthritis Sister 2   Colon cancer Daughter    COPD Sister    Cancer Sister        in nose   Diabetes Daughter    Hypertension Daughter    Healthy Daughter    Bell's palsy Maternal Grandmother    Esophageal cancer Neg Hx    Rectal cancer Neg Hx    Stomach cancer Neg Hx     ALLERGIES:  is allergic to zofran [ondansetron hcl], ace inhibitors, levofloxacin, lisinopril, sulfonamide derivatives, and sulfa antibiotics.  MEDICATIONS:  Current Outpatient Medications  Medication Sig Dispense Refill   Accu-Chek Softclix Lancets lancets Please use to check blood sugar up to two times per day. E11.42 200 each 4   Blood Glucose Monitoring Suppl (ONE TOUCH ULTRA 2) w/Device KIT USE AS DIRECTED 1 kit 0   cetirizine (ZYRTEC) 10 MG tablet Take 10 mg by mouth daily.     clobetasol (TEMOVATE) 0.05 % external solution APPLY 1 APPLICATION TOPICALLY DAILY TO SCALP UP TO 5 TIMES PER WEEK 50 mL 1   cromolyn (OPTICROM) 4 % ophthalmic solution Place 1 drop into both eyes 3 (three) times daily as needed (itchy eyes).     Cyanocobalamin (B-12) 5000 MCG CAPS Take 5,000 mcg by mouth daily.     diclofenac Sodium (VOLTAREN) 1 % GEL Apply 2 g topically 4 (four) times daily as needed (pain).     diphenhydramine-acetaminophen (TYLENOL PM) 25-500 MG TABS tablet Take 2 tablets by mouth at bedtime as needed (sleep).     Dulaglutide (TRULICITY) 3 MG/0.5ML SOAJ Inject 3 mg as directed once a week. 2 mL 2   DULERA 100-5 MCG/ACT AERO Inhale 2 puffs into the lungs 2 (two) times daily.     fluticasone (FLONASE) 50 MCG/ACT nasal spray Place 2 sprays into both nostrils daily as needed for allergies. 144 mL 1   gabapentin (NEURONTIN) 300 MG capsule TAKE 1 CAPSULE BY MOUTH EVERYDAY AT BEDTIME 90 capsule 3   glucose blood test strip Use as instructed a few times a week as needed 100 each 12   glucose blood test strip Use to check blood  glucose 2 times a day as needed 100 each 12   leflunomide (ARAVA) 10 MG tablet Take 10 mg by mouth daily.     meloxicam (MOBIC) 15 MG tablet Take 1 tablet (15 mg total) by mouth daily. 30 tablet 2   metFORMIN (GLUCOPHAGE-XR) 500 MG 24 hr tablet TAKE 2 TABLETS BY MOUTH TWICE A DAY 360 tablet 1   metoprolol succinate (TOPROL-XL) 50 MG 24 hr tablet Take 1 tablet (50 mg total) by mouth daily. Take with or immediately following a meal. 90 tablet 3   mirabegron ER (MYRBETRIQ) 25 MG TB24 tablet Take 25 mg by mouth daily.     mometasone (ELOCON) 0.1 % cream APPLY 1 APPLICATION TOPICALLY DAILY AS NEEDED (RASH). 45 g 2   montelukast (SINGULAIR) 10 MG tablet TAKE 1 TABLET BY MOUTH EVERY DAY (Patient taking differently: Take 10 mg by mouth at bedtime.) 30 tablet 0   mupirocin ointment (BACTROBAN) 2 % APPLY ONCE DAILY TO OPEN SORES ON ARMS, LEGS, TOES UNTIL HEALED, THEN APPLY AS NEEDED FOR FLARES 22 g 0   nitroGLYCERIN (NITROSTAT) 0.4 MG SL tablet Place 1 tablet (0.4 mg total) under the tongue every 5 (five) minutes as needed. 25 tablet 1   omeprazole (PRILOSEC) 40 MG capsule Take 1 capsule (40 mg total) by mouth in the morning. 90 capsule 1  oxyCODONE-acetaminophen (PERCOCET) 10-325 MG tablet Take 1 tablet by mouth every 6 (six) hours as needed for pain. 120 tablet 0   rosuvastatin (CRESTOR) 10 MG tablet Take 1 tablet (10 mg total) by mouth daily. (Patient taking differently: Take 10 mg by mouth every evening.) 90 tablet 3   sacubitril-valsartan (ENTRESTO) 24-26 MG Take 1 tablet by mouth twice daily 180 tablet 1   sertraline (ZOLOFT) 50 MG tablet TAKE 1 TABLET BY MOUTH EVERY DAY 90 tablet 3   Thiamine HCl (VITAMIN B-1) 250 MG tablet Take 250 mg by mouth daily.     VENTOLIN HFA 108 (90 Base) MCG/ACT inhaler INHALE 2 PUFFS BY MOUTH EVERY 4 HOURS AS NEEDED FOR WHEEZING OR SHORTNESS OF BREATH 108 each 11   No current facility-administered medications for this visit.    REVIEW OF SYSTEMS:   Constitutional: ( -  ) fevers, ( - )  chills , ( - ) night sweats Eyes: ( - ) blurriness of vision, ( - ) double vision, ( - ) watery eyes Ears, nose, mouth, throat, and face: ( - ) mucositis, ( - ) sore throat Respiratory: ( - ) cough, ( - ) dyspnea, ( - ) wheezes Cardiovascular: ( - ) palpitation, ( - ) chest discomfort, ( - ) lower extremity swelling Gastrointestinal:  ( - ) nausea, ( - ) heartburn, ( - ) change in bowel habits Skin: ( - ) abnormal skin rashes Lymphatics: ( - ) new lymphadenopathy, ( - ) easy bruising Neurological: ( - ) numbness, ( - ) tingling, ( - ) new weaknesses Behavioral/Psych: ( - ) mood change, ( - ) new changes  All other systems were reviewed with the patient and are negative.  PHYSICAL EXAMINATION:  Vitals:   06/11/23 0824  BP: 109/68  Pulse: 95  Resp: 16  Temp: 97.6 F (36.4 C)  SpO2: 95%   Filed Weights   06/11/23 0824  Weight: 135 lb 3.2 oz (61.3 kg)     GENERAL: Well-appearing elderly Caucasian female, alert, no distress and comfortable SKIN: skin color, texture, turgor are normal, no rashes or significant lesions EYES: conjunctiva are pink and non-injected, sclera clear LUNGS: clear to auscultation and percussion with normal breathing effort HEART: regular rate & rhythm and no murmurs and no lower extremity edema Musculoskeletal: no cyanosis of digits and no clubbing  PSYCH: alert & oriented x 3, fluent speech NEURO: no focal motor/sensory deficits  LABORATORY DATA:  I have reviewed the data as listed    Latest Ref Rng & Units 06/04/2023    8:36 AM 03/05/2023    8:34 AM 11/26/2022    9:00 AM  CBC  WBC 4.0 - 10.5 K/uL 8.8  8.5  13.9   Hemoglobin 12.0 - 15.0 g/dL 10.2  72.5  36.6   Hematocrit 36.0 - 46.0 % 37.3  37.3  36.2   Platelets 150 - 400 K/uL 222  211  233        Latest Ref Rng & Units 06/04/2023    8:36 AM 03/05/2023    8:34 AM 11/26/2022    9:00 AM  CMP  Glucose 70 - 99 mg/dL 440  347  425   BUN 8 - 23 mg/dL 16  11  11    Creatinine 0.44 -  1.00 mg/dL 9.56  3.87  5.64   Sodium 135 - 145 mmol/L 138  139  139   Potassium 3.5 - 5.1 mmol/L 4.3  4.5  4.0   Chloride 98 -  111 mmol/L 104  103  106   CO2 22 - 32 mmol/L 27  29  26    Calcium 8.9 - 10.3 mg/dL 9.5  9.4  8.9   Total Protein 6.5 - 8.1 g/dL 6.5  6.6  6.5   Total Bilirubin <1.2 mg/dL 0.4  0.5  0.4   Alkaline Phos 38 - 126 U/L 58  55  61   AST 15 - 41 U/L 10  15  13    ALT 0 - 44 U/L 9  12  13      Lab Results  Component Value Date   MPROTEIN Not Observed 08/29/2022   Lab Results  Component Value Date   KPAFRELGTCHN 23.0 (H) 08/29/2022   LAMBDASER 18.6 08/29/2022   KAPLAMBRATIO 1.24 08/29/2022     RADIOGRAPHIC STUDIES: ECHOCARDIOGRAM COMPLETE  Result Date: 05/14/2023    ECHOCARDIOGRAM REPORT   Patient Name:   Michelle [name] Date of Exam: 05/14/2023 Medical Rec #:  [id number]        Height:       62.0 in Accession #:    [id number]       Weight:       134.0 lb Date of Birth:  Mar 10, 1951        BSA:          1.612 m Patient Age:    72 years         BP:           113/69 mmHg Patient Gender: F                HR:           85 bpm. Exam Location:  [address] Procedure: 2D Echo, 3D Echo, Cardiac Doppler and Color Doppler Indications:    I50.2 CHF  History:        Patient has prior history of Echocardiogram examinations, most                 recent 07/17/2022. Risk Factors:Diabetes, Hypertension and HLD.  Sonographer:    Clearence Ped RCS Referring Phys: [id number] SUNIT TOLIA IMPRESSIONS  1. Left ventricular ejection fraction, by estimation, is 40 to 45%. The left ventricle has mildly decreased function. The left ventricle demonstrates global hypokinesis. Left ventricular diastolic parameters are indeterminate.  2. Right ventricular systolic function is normal. The right ventricular size is normal.  3. The mitral valve is degenerative. Trivial mitral valve regurgitation. No evidence of mitral stenosis.  4. The aortic valve is normal in structure. Aortic valve regurgitation is not  visualized. Aortic valve sclerosis is present, with no evidence of aortic valve stenosis.  5. The inferior vena cava is normal in size with greater than 50% respiratory variability, suggesting right atrial pressure of 3 mmHg. FINDINGS  Left Ventricle: Left ventricular ejection fraction, by estimation, is 40 to 45%. The left ventricle has mildly decreased function. The left ventricle demonstrates global hypokinesis. The left ventricular internal cavity size was normal in size. There is  no left ventricular hypertrophy. Left ventricular diastolic parameters are indeterminate. Right Ventricle: The right ventricular size is normal. No increase in right ventricular wall thickness. Right ventricular systolic function is normal. Left Atrium: Left atrial size was normal in size. Right Atrium: Right atrial size was normal in size. Pericardium: There is no evidence of pericardial effusion. Mitral Valve: The mitral valve is degenerative in appearance. Trivial mitral valve regurgitation. No evidence of mitral valve stenosis. Tricuspid Valve: The tricuspid valve is normal in structure. Tricuspid  valve regurgitation is not demonstrated. No evidence of tricuspid stenosis. Aortic Valve: The aortic valve is normal in structure. Aortic valve regurgitation is not visualized. Aortic valve sclerosis is present, with no evidence of aortic valve stenosis. Pulmonic Valve: The pulmonic valve was normal in structure. Pulmonic valve regurgitation is not visualized. No evidence of pulmonic stenosis. Aorta: The aortic root is normal in size and structure. Venous: The inferior vena cava is normal in size with greater than 50% respiratory variability, suggesting right atrial pressure of 3 mmHg. IAS/Shunts: No atrial level shunt detected by color flow Doppler.  LEFT VENTRICLE PLAX 2D LVIDd:         3.60 cm   Diastology LVIDs:         3.00 cm   LV e' medial:    6.20 cm/s LV PW:         1.00 cm   LV E/e' medial:  9.8 LV IVS:        1.00 cm   LV e'  lateral:   9.57 cm/s LVOT diam:     2.00 cm   LV E/e' lateral: 6.4 LV SV:         44 LV SV Index:   27 LVOT Area:     3.14 cm                           3D Volume EF:                          3D EF:        47 %                          LV EDV:       84 ml                          LV ESV:       45 ml                          LV SV:        40 ml RIGHT VENTRICLE RV Basal diam:  1.60 cm RV S prime:     6.96 cm/s TAPSE (M-mode): 1.4 cm LEFT ATRIUM             Index        RIGHT ATRIUM          Index LA diam:        3.00 cm 1.86 cm/m   RA Area:     5.02 cm LA Vol (A2C):   28.9 ml 17.92 ml/m  RA Volume:   5.74 ml  3.56 ml/m LA Vol (A4C):   19.0 ml 11.78 ml/m LA Biplane Vol: 23.4 ml 14.51 ml/m  AORTIC VALVE LVOT Vmax:   73.40 cm/s LVOT Vmean:  52.200 cm/s LVOT VTI:    0.139 m  AORTA Ao Root diam: 3.10 cm Ao Asc diam:  3.10 cm MITRAL VALVE MV Area (PHT):             SHUNTS MV Decel Time:             Systemic VTI:  0.14 m MV E velocity: 60.80 cm/s  Systemic Diam: 2.00 cm MV A velocity: 78.10 cm/s MV E/A ratio:  0.78 Kardie Tobb DO Electronically signed by  Thomasene Ripple DO Signature Date/Time: 05/14/2023/3:46:25 PM    Final     ASSESSMENT & PLAN Michelle [name] 72 y.o. female with medical history significant for deficiency anemia who presents for a follow up visit.    After review of the labs, review of the records, and discussion with the patient the patients findings are most consistent with iron deficiency anemia of unclear cause, suspect GI bleeding.  Additionally is possible the patient has anemia of chronic disease due to her rheumatoid arthritis.   # Iron Deficiency Anemia 2/2 to Unclear Cause, suspect GI Bleeding -- Findings are consistent with iron deficiency anemia, etiology is unclear.  --Encouraged her to follow-up with GI if she is found to be iron deficient.  --patient is s/p IV iron sucrose 200 mg x 5 doses in March/April 2024 --labs show White blood cell 8.8, Hgb 12.2, MCV 99.5, Plt  222 --Patient is not currently taking p.o. iron therapy, though does eat an iron rich diet. --Plan for return to clinic in 12 months with interval labs in 6 months  # Leukocytosis-resolved -- likely 2/2 to UTI at time of prior draw.  -- no recent steroid therapy -- continue to monitor.   No orders of the defined types were placed in this encounter.  All questions were answered. The patient knows to call the clinic with any problems, questions or concerns.  A total of more than 30 minutes were spent on this encounter with face-to-face time and non-face-to-face time, including preparing to see the patient, ordering tests and/or medications, counseling the patient and coordination of care as outlined above.   Ulysees Barns, MD Department of Hematology/Oncology [facility] Phone: [phone] Pager: [phone] Email: [email or web address]  06/11/2023 8:38 AM

## 2023-06-11 ENCOUNTER — Other Ambulatory Visit: Payer: Self-pay

## 2023-06-11 ENCOUNTER — Inpatient Hospital Stay (HOSPITAL_BASED_OUTPATIENT_CLINIC_OR_DEPARTMENT_OTHER): Payer: 59 | Admitting: Hematology and Oncology

## 2023-06-11 VITALS — BP 109/68 | HR 95 | Temp 97.6°F | Resp 16 | Wt 135.2 lb

## 2023-06-11 DIAGNOSIS — M069 Rheumatoid arthritis, unspecified: Secondary | ICD-10-CM | POA: Diagnosis not present

## 2023-06-11 DIAGNOSIS — Z79899 Other long term (current) drug therapy: Secondary | ICD-10-CM | POA: Diagnosis not present

## 2023-06-11 DIAGNOSIS — Z7969 Long term (current) use of other immunomodulators and immunosuppressants: Secondary | ICD-10-CM | POA: Diagnosis not present

## 2023-06-11 DIAGNOSIS — D500 Iron deficiency anemia secondary to blood loss (chronic): Secondary | ICD-10-CM

## 2023-06-11 DIAGNOSIS — D509 Iron deficiency anemia, unspecified: Secondary | ICD-10-CM | POA: Diagnosis not present

## 2023-06-11 DIAGNOSIS — Z87891 Personal history of nicotine dependence: Secondary | ICD-10-CM | POA: Diagnosis not present

## 2023-06-17 ENCOUNTER — Other Ambulatory Visit: Payer: Self-pay

## 2023-06-17 MED ORDER — ROSUVASTATIN CALCIUM 10 MG PO TABS: 10.0000 mg | ORAL_TABLET | Freq: Every day | ORAL | 3 refills | Status: DC

## 2023-06-18 ENCOUNTER — Other Ambulatory Visit: Payer: Self-pay | Admitting: Cardiology

## 2023-06-18 ENCOUNTER — Ambulatory Visit: Payer: 59 | Admitting: Podiatry

## 2023-06-18 ENCOUNTER — Encounter: Payer: Self-pay | Admitting: Podiatry

## 2023-06-18 VITALS — Ht 62.0 in | Wt 135.2 lb

## 2023-06-18 DIAGNOSIS — M79674 Pain in right toe(s): Secondary | ICD-10-CM

## 2023-06-18 DIAGNOSIS — B351 Tinea unguium: Secondary | ICD-10-CM

## 2023-06-18 DIAGNOSIS — M79675 Pain in left toe(s): Secondary | ICD-10-CM | POA: Diagnosis not present

## 2023-06-18 DIAGNOSIS — I5022 Chronic systolic (congestive) heart failure: Secondary | ICD-10-CM

## 2023-06-18 DIAGNOSIS — E1151 Type 2 diabetes mellitus with diabetic peripheral angiopathy without gangrene: Secondary | ICD-10-CM

## 2023-06-18 NOTE — Progress Notes (Signed)
Subjective:  Patient ID: Michelle [name], female    DOB: 1951/03/06,  MRN: [id number]  72 y.o. female presents at risk foot care. Pt has h/o NIDDM with PAD and painful thick toenails that are difficult to trim. Pain interferes with ambulation. Aggravating factors include wearing enclosed shoe gear. Pain is relieved with periodic professional debridement.  Chief Complaint  Patient presents with   Nail Problem    Pt is here for [facility], last A1C was 8.5 PCP is Dr Barb Merino and LOV was December 1st.    New problem(s): None   PCP is Vonna Drafts, MD.  Allergies  Allergen Reactions   Zofran [Ondansetron Hcl] Other (See Comments)    Prolong QT   Ace Inhibitors Cough    Occurred with lisinopril   Levofloxacin Other (See Comments)    Felt things on her legs that were not there,made her stomach hurt   Lisinopril Cough   Sulfonamide Derivatives Hives and Itching   Sulfa Antibiotics Hives and Itching    Review of Systems: Negative except as noted in the HPI.   Objective:  Michelle [name] is a pleasant 72 y.o. female WD, WN in NAD. AAO x 3.  Vascular Examination: CFT <3 seconds b/l LE. Faintly palpable DP pulse(s) right foot. Faintly palpable PT pulse(s) right foot. Diminished DP pulse(s) left foot. Diminished PT pulse(s) left foot. Pedal hair absent. No pain with calf compression b/l. No cyanosis or clubbing noted b/l LE.  Neurological Examination: Pt has subjective symptoms of neuropathy. Sensation grossly intact b/l with 10 gram monofilament. Vibratory sensation intact b/l.  Dermatological Examination: Pedal skin with normal turgor, texture and tone b/l. No open wounds nor interdigital macerations noted. Toenails 1-5 b/l thick, discolored, elongated with subungual debris and pain on dorsal palpation. No hyperkeratotic lesions noted b/l.   Musculoskeletal Examination: Muscle strength 5/5 to b/l LE.  No pain, crepitus noted b/l. HAV with bunion deformity noted b/l LE. Hammertoe(s) noted  to the L 3rd toe and R 3rd toe.  Radiographs: None  Last A1c:      Latest Ref Rng & Units 06/02/2023    1:35 PM 03/04/2023    8:34 AM 11/26/2022   10:54 AM 08/26/2022    8:28 AM  Hemoglobin A1C  Hemoglobin-A1c 0.0 - 7.0 % 8.2  7.6  6.6  8.0      Assessment:   1. Pain due to onychomycosis of toenails of both feet   2. Diabetes mellitus with peripheral circulatory disorder [facility])    Plan:  Patient was evaluated and treated. All patient's and/or POA's questions/concerns addressed on today's visit. Toenails 1-5 debrided in length and girth without incident. Continue soft, supportive shoe gear daily. Report any pedal injuries to medical professional. Call office if there are any questions/concerns. -Continue foot and shoe inspections daily. Monitor blood glucose per PCP/Endocrinologist's recommendations. -Patient/POA to call should there be question/concern in the interim.  Return in about 3 months (around 09/16/2023).  Freddie Breech, DPM      [place] LOCATION: [address], [place] [address]                   Office [phone]   [place] LOCATION: [address]  [place], [place] [address] Office [phone]

## 2023-06-21 ENCOUNTER — Other Ambulatory Visit: Payer: Self-pay | Admitting: Dermatology

## 2023-06-21 DIAGNOSIS — T148XXA Other injury of unspecified body region, initial encounter: Secondary | ICD-10-CM

## 2023-06-27 ENCOUNTER — Ambulatory Visit (INDEPENDENT_AMBULATORY_CARE_PROVIDER_SITE_OTHER): Payer: 59 | Admitting: Student

## 2023-06-27 ENCOUNTER — Encounter: Payer: Self-pay | Admitting: Student

## 2023-06-27 VITALS — BP 108/66 | HR 80 | Ht 62.0 in | Wt 135.4 lb

## 2023-06-27 DIAGNOSIS — R3915 Urgency of urination: Secondary | ICD-10-CM | POA: Diagnosis not present

## 2023-06-27 DIAGNOSIS — K529 Noninfective gastroenteritis and colitis, unspecified: Secondary | ICD-10-CM | POA: Diagnosis not present

## 2023-06-27 LAB — POCT URINALYSIS DIP (MANUAL ENTRY)
Bilirubin, UA: NEGATIVE
Glucose, UA: NEGATIVE mg/dL
Nitrite, UA: POSITIVE — AB
Protein Ur, POC: 30 mg/dL — AB
Spec Grav, UA: >=1.03 — AB (ref 1.010–1.025)
Urobilinogen, UA: 0.2 U/dL
pH, UA: 5.5 (ref 5.0–8.0)

## 2023-06-27 LAB — POCT UA - MICROSCOPIC ONLY: WBC, Ur, HPF, POC: >20 (ref 0–5)

## 2023-06-27 MED ORDER — LOPERAMIDE HCL 2 MG PO TABS: 4.0000 mg | ORAL_TABLET | Freq: Four times a day (QID) | ORAL | 0 refills | Status: DC | PRN

## 2023-06-27 MED ORDER — LOPERAMIDE HCL 2 MG PO CAPS: 4.0000 mg | ORAL_CAPSULE | Freq: Four times a day (QID) | ORAL | 0 refills | Status: AC

## 2023-06-27 MED ORDER — CHOLESTYRAMINE 4 G PO PACK: 4.0000 g | PACK | Freq: Every day | ORAL | 0 refills | Status: DC

## 2023-06-27 MED ORDER — CEPHALEXIN 500 MG PO CAPS: 500.0000 mg | ORAL_CAPSULE | Freq: Four times a day (QID) | ORAL | 0 refills | Status: AC

## 2023-06-27 MED ORDER — FLUCONAZOLE 150 MG PO TABS: 150.0000 mg | ORAL_TABLET | Freq: Once | ORAL | 0 refills | Status: AC

## 2023-06-27 NOTE — Addendum Note (Signed)
Addended by: Levin Erp on: 06/27/2023 12:04 PM   Modules accepted: Orders

## 2023-06-27 NOTE — Progress Notes (Addendum)
    SUBJECTIVE:   CHIEF COMPLAINT / HPI: Diarrhea  Diarrhea Last Colonoscopy 2022-ileum appeared normal, diverticulosis of left colon Chronic diarrhea-has previously tried cholestyramine but not on it (hx of cholecystectomy) The patient reports that she has been having worsening diarrhea over past three weeks.  States she has diarrhea every time she eats or drinks, and the frequency has increased from once a day to multiple times a day.  The patient also reports abdominal pain, which is worse after eating or drinking.  The patient has tried Imodium to manage the diarrhea 2 pills one time 2 days ago Denies any recent travel or antibiotic use. No blood in stool or dark stools  Urinary urgency Hx of urinary urgency on myrbetriq Dysuria- No Frequency- No Urgency- Yes  Hematuria- No Flank pain- No Suprapubic pain- Yes  Fevers- No  Feels like UTI is past? Yes  PERTINENT  PMH / PSH: History of bowel and bladder incontinence, RA, bilateral sacroiliitis HFrEF, HTN, asthma, restrictive lung disease, HLD, T2DM,  OBJECTIVE:   BP 108/66   Pulse 80   Ht 5\' 2"  (1.575 m)   Wt 135 lb 6.4 oz (61.4 kg)   SpO2 97%   BMI 24.76 kg/m   General: Well appearing, NAD, awake, alert, responsive to questions Head: Normocephalic atraumatic CV: Regular rate and rhythm no murmurs rubs or gallops Respiratory: Clear to ausculation bilaterally, no wheezes rales or crackles, chest rises symmetrically,  no increased work of breathing Abdomen: Soft, mild suprapubic tenderness, non-distended, no CVA tenderness Extremities: Moves upper and lower extremities freely  ASSESSMENT/PLAN:   Assessment & Plan Chronic diarrhea Chronic diarrhea likely medication induced per prior GI visit in 2022 (Areva, metformin, Trulicity) at that time GI had recommended taking Imodium scheduled basis.  Patient only taking this intermittently.  Abdomen benign on examination.  Will trial bile acid sequestrant as well given hx of  cholecystectomy. - cholestyramine (QUESTRAN) 4 g packet; Take 1 packet (4 g total) by mouth daily - scheduled loperamide (IMODIUM A-D) 2 MG tablet; Take 2 tablets (4 mg total) by mouth 4 (four) times daily -2-week follow-up  Urinary urgency UA with positive nitrates and >20 WBC, urgency and suprapubic tenderness.  Meets UTI definition.  Called patient and confirmed date of birth and discussed treatment plan. -cephALEXin (KEFLEX) 500 MG capsule; Take 1 capsule (500 mg total) by mouth 4 (four) times daily for 7 days.   - Urine Culture - fluconazole (DIFLUCAN) 150 MG tablet; Take 1 tablet (150 mg total) by mouth once for 1 dose. Take after completing antibiotic course  Levin Erp, MD [facility]

## 2023-06-27 NOTE — Patient Instructions (Addendum)
It was great to see you! Thank you for allowing me to participate in your care!   Our plans for today:  - Immodium 4 mg 4 times daily scheduled - Cholestyramine 4 grams daily - Getting urine tests today - Follow up in 2 weeks  Take care and seek immediate care sooner if you develop any concerns.  Levin Erp, MD

## 2023-06-30 ENCOUNTER — Other Ambulatory Visit: Payer: Self-pay

## 2023-06-30 DIAGNOSIS — G8929 Other chronic pain: Secondary | ICD-10-CM

## 2023-06-30 DIAGNOSIS — M059 Rheumatoid arthritis with rheumatoid factor, unspecified: Secondary | ICD-10-CM

## 2023-06-30 LAB — URINE CULTURE

## 2023-06-30 MED ORDER — OXYCODONE-ACETAMINOPHEN 10-325 MG PO TABS: 1.0000 | ORAL_TABLET | Freq: Four times a day (QID) | ORAL | 0 refills | Status: DC | PRN

## 2023-06-30 NOTE — Telephone Encounter (Signed)
PDMP reviewed and appropriate.  Last refilled Percocet 10-325 #120 on 06/12/2023. Due for refill on 07/12/2023.

## 2023-07-03 ENCOUNTER — Ambulatory Visit: Payer: Self-pay

## 2023-07-03 DIAGNOSIS — R350 Frequency of micturition: Secondary | ICD-10-CM | POA: Diagnosis not present

## 2023-07-03 NOTE — Patient Instructions (Signed)
 Visit Information  Thank you for taking time to visit with me today. Please don't hesitate to contact me if I can be of assistance to you.   Following are the goals we discussed today:   Goals Addressed               This Visit's Progress     Diabetes Patient stated goal- lower my A1C (pt-stated)        Patient Goals/Self Care Activities: -Patient/Caregiver will take medications as prescribed   -Patient/Caregiver will attend all scheduled provider appointments -Patient/Caregiver will call pharmacy for medication refills 3-7 days in advance of running out of medications -Patient/Caregiver will call provider office for new concerns or questions  -Patient/Caregiver will focus on medication adherence by taking medications as prescribed   Objective:  Lab Results  Component Value Date   HGBA1C 8.2 (A) 06/02/2023   Lab Results  Component Value Date   CREATININE 0.61 06/04/2023   CREATININE 0.55 03/05/2023   CREATININE 0.56 11/26/2022   Lab Results  Component Value Date   EGFR 102 08/16/2022    adherence to prescribed medication regimen       contacting provider for new or worsened symptoms or questions      Reviewed medications with patient and discussed importance of medication adherence Make sure to check blood sugars 2 hours after eating Stay hydrated Eat brat diet            If you are experiencing a Mental Health or Behavioral Health Crisis or need someone to talk to, please call 1-800-273-TALK (toll free, 24 hour hotline)  Patient verbalizes understanding of instructions and care plan provided today and agrees to view in MyChart. Active MyChart status and patient understanding of how to access instructions and care plan via MyChart confirmed with patient.     Follow up plan: No further intervention required. The patient will follow up with Rosina Forte RN

## 2023-07-03 NOTE — Patient Outreach (Signed)
  Care Coordination   Follow Up Visit Note   07/03/2023 Name: Michelle [name] MRN: [id number] DOB: 1950/09/30  Michelle [name] is a 73 y.o. year old female who sees [name], MD for primary care. I spoke with  Asberry GORMAN Lares by phone today.  What matters to the patients health and wellness today?  I had a conversation with Mrs. Michelle [name], who reported that she has been experiencing illness for the past three weeks but is feeling better today. She has been dealing with recurrent urinary tract infections along with gastrointestinal issues. During her recent visit to the urologist, she was prescribed a low-dose antibiotic that she is to take daily. Throughout this period, her food intake has been limited; however, we discussed the BRAT diet, and she confirmed that she has been maintaining adequate hydration by consuming a significant amount of fluids. Her blood sugars has consistently ranged between 133 and 148. Although she has felt somewhat fatigued due to her decreased appetite and has experienced intermittent diarrhea, she remains diligent in adhering to her prescribed medication regimen.    Goals Addressed               This Visit's Progress     Diabetes Patient stated goal- lower my A1C (pt-stated)        Patient Goals/Self Care Activities: -Patient/Caregiver will take medications as prescribed   -Patient/Caregiver will attend all scheduled provider appointments -Patient/Caregiver will call pharmacy for medication refills 3-7 days in advance of running out of medications -Patient/Caregiver will call provider office for new concerns or questions  -Patient/Caregiver will focus on medication adherence by taking medications as prescribed   Objective:  Lab Results  Component Value Date   HGBA1C 8.2 (A) 06/02/2023   Lab Results  Component Value Date   CREATININE 0.61 06/04/2023   CREATININE 0.55 03/05/2023   CREATININE 0.56 11/26/2022   Lab Results  Component Value Date   EGFR  102 08/16/2022    adherence to prescribed medication regimen       contacting provider for new or worsened symptoms or questions      Reviewed medications with patient and discussed importance of medication adherence Make sure to check blood sugars 2 hours after eating Stay hydrated Eat brat diet           SDOH assessments and interventions completed:  No     Care Coordination Interventions:  Yes, provided   Interventions Today    Flowsheet Row Most Recent Value  Chronic Disease   Chronic disease during today's visit Diabetes  General Interventions   General Interventions Discussed/Reviewed General Interventions Discussed, Sick Day Rules  Education Interventions   Education Provided Provided Education  Provided Verbal Education On Nutrition, Blood Sugar Monitoring  Nutrition Interventions   Nutrition Discussed/Reviewed Fluid intake  Pharmacy Interventions   Pharmacy Dicussed/Reviewed Pharmacy Topics Discussed  Safety Interventions   Safety Discussed/Reviewed Safety Discussed        Follow up plan: No further intervention required. The patient will follow up with Rosina Forte RN  Encounter Outcome:  Patient Visit Completed   Wilbert Diver RN, BSN, [facility]  [facility], [facility]  Care Coordinator Phone: [phone]

## 2023-07-09 DIAGNOSIS — H02054 Trichiasis without entropian left upper eyelid: Secondary | ICD-10-CM | POA: Diagnosis not present

## 2023-07-11 ENCOUNTER — Other Ambulatory Visit: Payer: Self-pay

## 2023-07-12 MED ORDER — METFORMIN HCL ER 500 MG PO TB24: 1000.0000 mg | ORAL_TABLET | Freq: Two times a day (BID) | ORAL | 1 refills | Status: DC

## 2023-07-23 ENCOUNTER — Other Ambulatory Visit: Payer: Self-pay | Admitting: Family Medicine

## 2023-07-29 ENCOUNTER — Other Ambulatory Visit: Payer: Self-pay | Admitting: Dermatology

## 2023-07-29 DIAGNOSIS — T148XXA Other injury of unspecified body region, initial encounter: Secondary | ICD-10-CM

## 2023-07-30 ENCOUNTER — Encounter: Payer: Self-pay | Admitting: Hematology and Oncology

## 2023-08-01 ENCOUNTER — Other Ambulatory Visit: Payer: Self-pay

## 2023-08-01 DIAGNOSIS — M059 Rheumatoid arthritis with rheumatoid factor, unspecified: Secondary | ICD-10-CM

## 2023-08-01 DIAGNOSIS — G8929 Other chronic pain: Secondary | ICD-10-CM

## 2023-08-01 DIAGNOSIS — E1142 Type 2 diabetes mellitus with diabetic polyneuropathy: Secondary | ICD-10-CM

## 2023-08-03 MED ORDER — TRULICITY 3 MG/0.5ML ~~LOC~~ SOAJ: 3.0000 mg | SUBCUTANEOUS | 2 refills | Status: DC

## 2023-08-03 MED ORDER — OXYCODONE-ACETAMINOPHEN 10-325 MG PO TABS: 1.0000 | ORAL_TABLET | Freq: Four times a day (QID) | ORAL | 0 refills | Status: DC | PRN

## 2023-08-05 ENCOUNTER — Other Ambulatory Visit: Payer: Self-pay | Admitting: Family Medicine

## 2023-08-05 DIAGNOSIS — Z1231 Encounter for screening mammogram for malignant neoplasm of breast: Secondary | ICD-10-CM

## 2023-08-06 ENCOUNTER — Encounter: Payer: Self-pay | Admitting: *Deleted

## 2023-08-06 ENCOUNTER — Other Ambulatory Visit: Payer: Self-pay | Admitting: *Deleted

## 2023-08-06 ENCOUNTER — Other Ambulatory Visit: Payer: 59 | Admitting: *Deleted

## 2023-08-06 NOTE — Patient Outreach (Addendum)
 Care Management   Visit Note  08/06/2023 Name: Michelle [name] MRN: [id number] DOB: [birth year]  Subjective: Michelle [name] is a 74 y.o. year old female who is a primary care patient of [name], MD. The Care Management team was consulted for assistance.      Engaged with patient spoke with patient by telephone.   Goals Addressed             This Visit's Progress    RNCM Care Management Expected Outcomes: Monitor, Self-Manage and Reduce Symptoms of: DM       Current Barriers:  Chronic Disease Management support and education needs related to DMII   RNCM Clinical Goal(s):  Patient will verbalize basic understanding of  DMII disease process and self health management plan as evidenced by verbal explanation, recognizing symptoms, lifestyle modifications attend all scheduled medical appointments: with primary care provider and specialist as evidenced by keeping all scheduled appointments demonstrate Improved and Ongoing adherence to prescribed treatment plan for DMII as evidenced by consistent medication compliance, symptom monitoring, continued lifestyle changes continue to work with RN Care Manager to address care management and care coordination needs related to  DMII as evidenced by adherence to CM Team Scheduled appointments through collaboration with RN Care manager, provider, and care team.   Interventions: Evaluation of current treatment plan related to  self management and patient's adherence to plan as established by provider   Diabetes Interventions:  (Status:  Goal on track:  Yes.) Long Term Goal Assessed patient's understanding of A1c goal: <7% Provided education to patient about basic DM disease process. Patient reports she has not checked her blood sugar in 3 weeks because her One Touch Ultra Blue has malfunctioned and she is requesting RNCM to send a request to the office for a new machine. She reports that when she checked it 3 weeks ago it was 118. RNCM reviewed  with patient A1C being above goal and she states it was because she was on an prolonged course of prednisone  for her arthritis flair up. Reviewed medications with patient and discussed importance of medication adherence. Reports compliance with all medications Counseled on importance of regular laboratory monitoring as prescribed Discussed plans with patient for ongoing care management follow up and provided patient with direct contact information for care management team Provided patient with written educational materials related to hypo and hyperglycemia and importance of correct treatment. Reports no signs or symptoms of hyper/hypoglycemic episodes Reviewed scheduled/upcoming provider appointments including: 08-19-2023 with Neurology, 09-02-2023 with PCP Advised patient, providing education and rationale, to check cbg at least daily and record, calling provider for findings outside established parameters. RNCM reviewed with patient goal fasting <130 post prandial <180. Review of patient status, including review of consultants reports, relevant laboratory and other test results, and medications completed Screening for signs and symptoms of depression related to chronic disease state  Assessed social determinant of health barriers Lab Results  Component Value Date   HGBA1C 8.2 (A) 06/02/2023    Patient Goals/Self-Care Activities: Take all medications as prescribed Attend all scheduled provider appointments Call pharmacy for medication refills 3-7 days in advance of running out of medications Attend church or other social activities Perform all self care activities independently  Perform IADL's (shopping, preparing meals, housekeeping, managing finances) independently Call provider office for new concerns or questions  keep appointment with eye doctor check blood sugar at prescribed times: once daily and when you have symptoms of low or high blood sugar check feet daily for cuts,  sores or  redness take the blood sugar log to all doctor visits fill half of plate with vegetables manage portion size switch to sugar-free drinks keep feet up while sitting wash and dry feet carefully every day wear comfortable, cotton socks wear comfortable, well-fitting shoes  Follow Up Plan:  Telephone follow up appointment with care management team member scheduled for:  09-03-2023 at 3:45 pm             Consent to Services:  Patient was given information about care management services, agreed to services, and gave verbal consent to participate.   Plan: Telephone follow up appointment with care management team member scheduled for:09-03-2023 at 3:45 pm  Rosina Sicard, BSN RN [facility], [facility] RN Care Manager Direct Dial: [phone]  Fax: [phone]

## 2023-08-06 NOTE — Patient Instructions (Addendum)
 Visit Information  Thank you for taking time to visit with me today. Please don't hesitate to contact me if I can be of assistance to you before our next scheduled telephone appointment.  Following are the goals we discussed today:   Goals Addressed             This Visit's Progress    RNCM Care Management Expected Outcomes: Monitor, Self-Manage and Reduce Symptoms of: DM       Current Barriers:  Chronic Disease Management support and education needs related to DMII   RNCM Clinical Goal(s):  Patient will verbalize basic understanding of  DMII disease process and self health management plan as evidenced by verbal explanation, recognizing symptoms, lifestyle modifications attend all scheduled medical appointments: with primary care provider and specialist as evidenced by keeping all scheduled appointments demonstrate Improved and Ongoing adherence to prescribed treatment plan for DMII as evidenced by consistent medication compliance, symptom monitoring, continued lifestyle changes continue to work with RN Care Manager to address care management and care coordination needs related to  DMII as evidenced by adherence to CM Team Scheduled appointments through collaboration with RN Care manager, provider, and care team.   Interventions: Evaluation of current treatment plan related to  self management and patient's adherence to plan as established by provider   Diabetes Interventions:  (Status:  Goal on track:  Yes.) Long Term Goal Assessed patient's understanding of A1c goal: <7% Provided education to patient about basic DM disease process. Patient reports she has not checked her blood sugar in 3 weeks because her One Touch Ultra Blue has malfunctioned and she is requesting RNCM to send a request to the office for a new machine. She reports that when she checked it 3 weeks ago it was 118. RNCM reviewed with patient A1C being above goal and she states it was because she was on an prolonged course  of prednisone  for her arthritis flair up. Reviewed medications with patient and discussed importance of medication adherence. Reports compliance with all medications Counseled on importance of regular laboratory monitoring as prescribed Discussed plans with patient for ongoing care management follow up and provided patient with direct contact information for care management team Provided patient with written educational materials related to hypo and hyperglycemia and importance of correct treatment. Reports no signs or symptoms of hyper/hypoglycemic episodes Reviewed scheduled/upcoming provider appointments including: 08-19-2023 with Neurology, 09-02-2023 with PCP Advised patient, providing education and rationale, to check cbg at least daily and record, calling provider for findings outside established parameters. RNCM reviewed with patient goal fasting <130 post prandial <180. Review of patient status, including review of consultants reports, relevant laboratory and other test results, and medications completed Screening for signs and symptoms of depression related to chronic disease state  Assessed social determinant of health barriers Lab Results  Component Value Date   HGBA1C 8.2 (A) 06/02/2023    Patient Goals/Self-Care Activities: Take all medications as prescribed Attend all scheduled provider appointments Call pharmacy for medication refills 3-7 days in advance of running out of medications Attend church or other social activities Perform all self care activities independently  Perform IADL's (shopping, preparing meals, housekeeping, managing finances) independently Call provider office for new concerns or questions  keep appointment with eye doctor check blood sugar at prescribed times: once daily and when you have symptoms of low or high blood sugar check feet daily for cuts, sores or redness take the blood sugar log to all doctor visits fill half of plate with vegetables  manage  portion size switch to sugar-free drinks keep feet up while sitting wash and dry feet carefully every day wear comfortable, cotton socks wear comfortable, well-fitting shoes  Follow Up Plan:  Telephone follow up appointment with care management team member scheduled for:  09-03-2023 at 3:45 pm             Our next appointment is by telephone on 09-03-2023 at 3:45 pm  Please call the care guide team at [phone] if you need to cancel or reschedule your appointment.   If you are experiencing a Mental Health or Behavioral Health Crisis or need someone to talk to, please call the Suicide and Crisis Lifeline: 988 call the [place]  National Suicide Prevention Lifeline: 307-256-0511 or TTY: 276-671-0099 TTY 250-438-5995) to talk to a trained counselor call 1-800-273-TALK (toll free, 24 hour hotline) go to [facility] [address] [place] [id number])   Patient verbalizes understanding of instructions and care plan provided today and agrees to view in MyChart. Active MyChart status and patient understanding of how to access instructions and care plan via MyChart confirmed with patient.     Telephone follow up appointment with care management team member scheduled for:09-03-2023 at 3:45 pm  Rosina Sicard, BSN RN [facility], [facility] RN Care Manager Direct Dial: [phone]  Fax: [phone]

## 2023-08-12 ENCOUNTER — Other Ambulatory Visit: Payer: Self-pay | Admitting: Family Medicine

## 2023-08-13 ENCOUNTER — Ambulatory Visit: Payer: 59 | Admitting: Physician Assistant

## 2023-08-19 ENCOUNTER — Ambulatory Visit: Payer: 59 | Admitting: Physician Assistant

## 2023-08-19 DIAGNOSIS — M25571 Pain in right ankle and joints of right foot: Secondary | ICD-10-CM | POA: Diagnosis not present

## 2023-08-19 DIAGNOSIS — M25561 Pain in right knee: Secondary | ICD-10-CM | POA: Diagnosis not present

## 2023-08-19 DIAGNOSIS — M25512 Pain in left shoulder: Secondary | ICD-10-CM | POA: Diagnosis not present

## 2023-08-22 ENCOUNTER — Other Ambulatory Visit: Payer: Self-pay

## 2023-08-22 MED ORDER — GABAPENTIN 300 MG PO CAPS: ORAL_CAPSULE | ORAL | 3 refills | Status: AC

## 2023-08-26 DIAGNOSIS — S32009K Unspecified fracture of unspecified lumbar vertebra, subsequent encounter for fracture with nonunion: Secondary | ICD-10-CM | POA: Diagnosis not present

## 2023-08-26 DIAGNOSIS — M5416 Radiculopathy, lumbar region: Secondary | ICD-10-CM | POA: Diagnosis not present

## 2023-08-27 ENCOUNTER — Ambulatory Visit (INDEPENDENT_AMBULATORY_CARE_PROVIDER_SITE_OTHER): Payer: 59 | Admitting: Physician Assistant

## 2023-08-27 ENCOUNTER — Encounter: Payer: Self-pay | Admitting: Physician Assistant

## 2023-08-27 VITALS — BP 109/64 | HR 85 | Resp 18 | Ht 62.0 in | Wt 131.0 lb

## 2023-08-27 DIAGNOSIS — R413 Other amnesia: Secondary | ICD-10-CM

## 2023-08-27 NOTE — Patient Instructions (Signed)
  Follow up in 1 year  Recommend good control of cardiovascular risk factors.    Recommend discussing with primary doctor  the use of omeprazole  this medicine if taken over a prior of greater than 3 years can contribute to memory difficulties  Continue B12 and monitor anemia

## 2023-08-27 NOTE — Progress Notes (Signed)
 Assessment/Plan:   Memory difficulties  Michelle [name] is a very pleasant 73 y.o. RH female with a history of iron deficiency anemia, COPD, B12 deficiency, chronic back pain with left foot numbness, CHF essential tremor, DM, GAD, GERD, RLS, seropositive rheumatoid arthritis presenting today in follow-up for evaluation of memory loss. Patient is not on antidementia medications. MMSE is 30/30.  Memory is stable . Patient is able to participate on ADLs and to drive without difficulties.      Recommendations:   Follow up in 1 year Recommend good control of cardiovascular risk factors Continue to control mood as per PCP, patient is on Zoloft Recommend limiting the use of narcotics and muscle relaxers Continue adequate COPD management Continue B12 and B1 supplements Follow-up iron deficiency anemia with hematology No indication for antidementia medications at this time  Essential Tremors   She has been evaluated by Dr. Arbutus Leas in the past, last seen on 02/08/22 placed on primidone but did not find the medicine helpful, and she is off of it now  On exam, minimal L intention tremor (she reports it is better than prior)  otherwise no other tremors noted. Patient aware that COPD meds can cause tremors . No changes since last visit No further therapy for ET is indicated   B12 deficiency Continue B12 supplementation    Chronic back pain with L foot numbness    had surgery Aug 29, L3-L4 with improvement of symptoms   NCS without evidence of RCP neuropathy or large fiber sensorimotor neuropathy.  She has chronic lumbar radiculopathy  R L3-L4 nerve root segment, mild.  Follow-up with neurosurgery and PT/OT and pain clinic   Continue gabapentin  as directed by neurosurgery    Subjective:   This patient is here alone. Previous records as well as any outside records available were reviewed prior to todays visit.   Patient was last seen on 08/12/2022 with [facility] 29/30    Any changes in memory  since last visit? "About the same".  She denies any significant difficulty remembering recent conversations and names of people.  She enjoys reading frequently and phone games. Likes going to [employer]. repeats oneself?  Endorsed, but not often Disoriented when walking into a room?  Patient denies    Misplacing objects?  Patient denies   Wandering behavior?   denies   Any personality changes since last visit?   denies   Any worsening depression?: Denies She has a history of mild situational depression, she is on Zoloft  Hallucinations or paranoia?  denies   Seizures?   denies    Any sleep changes?  She is sleeping better.  She takes melatonin.  Denies vivid dreams, she has a history of RLS, denies other REM behavior or sleepwalking   Sleep apnea?   Denies   Any hygiene concerns?   Denies.   Independent of bathing and dressing?  Endorsed  Does the patient needs help with medications? Patient is in charge   Who is in charge of the finances?  Patient in charge.  Any changes in appetite?  Denies.     Patient have trouble swallowing?  Denies.   Does the patient cook?  Yes.  any kitchen accidents such as leaving the stove on?   denies   Any headaches?    Denies.   Vision changes? Denies. Chronic pain?  Endorsed, she follows with neurosurgery and PT and OT recently, attends pain clinic. Ambulates with difficulty?  She reports "bone-on-bone knee arthritis ", she is  due for surgery soon, which she hopes will help her improve mobility. Recent falls or head injuries?    Denies.      Unilateral weakness, numbness or tingling?   Denies.   Any tremors?  She has a history of ET, with mild left hand intention tremor "about the same".  She is able to participate in her ADLs, button clothing, she denies any other parkinsonian signs. Any anosmia?    denies   Any incontinence of urine?  She has mild urge incontinence "a little better"..    Any bowel dysfunction?  She has a history of diverticulitis, with  frequent diarrhea and intermittent constipation. Patient lives with her husband and granddaughter.  Does the patient drive?  Yes, denies getting lost  Past Medical History:  Diagnosis Date   Actinic keratosis    Allergic rhinitis 08/28/2006   Anemia    low iron   Anginal pain (HCC)    Asthma, chronic 03/14/2010   Does seem to use her albuterol excessively. Consider change to more manageable medicines and treat more like COPD in the future.   Carpal tunnel syndrome, left 05/29/2009   Last Assessment & Plan:  Advised to wear wrist brace at night when she is sleeping.  If this does not help will consider having her go to sports medicine.   CHF (congestive heart failure) (HCC) 07/2022   HFrEF, NYHA Class I   Chronic frontal sinusitis 08/28/2016   Chronic left maxillary sinusitis 08/28/2016   Diverticulosis    Essential hypertension 10/30/2012   Gastroparesis 11/19/2011   DM and chronic narcotics contribute.  Causes dysphagia like symptoms    Generalized anxiety disorder 08/28/2006   GERD (gastroesophageal reflux disease)    Hearing loss 07/19/2015   Hearing aid in right ear   History of cataracts, bilateral    Hyperlipidemia 08/28/2006   Lumbar radiculopathy 04/07/2018   Lung nodules    Nonischemic cardiomyopathy (HCC)    Osteoporosis 08/08/2011   Pain in joint, pelvic region and thigh 11/18/2012   Radial styloid tenosynovitis 08/15/2013   Restless leg syndrome 05/14/2012   Seropositive rheumatoid arthritis 01/08/2010   Patient is being followed by rheumatologist at [facility] patient is on Plaquenil and as well as prednisone.   Changed to methotrexate and prednisone in May of 2013  Patient does have a pain contract here with [facility] receives oxycodone 10/325 mg tabs every 6 hours. This has been titrated down from significant doses of methadone previously. Could attempt to tit   Shoulder joint pain 03/25/2011   The patient is compensating due to  her not being able to use her left arm right now rotator cuff impingement if he continues to worsen would not be surprised if she does get a tear on this side as well   Squamous cell carcinoma in situ of skin 11/25/2014   Referred to dermatology for excision    Squamous cell carcinoma of skin 12/26/2014   Right distal pretibial. SCC-KA pattern.   Squamous cell carcinoma of skin 03/27/2015   Right inf. lat. knee. SCC-KA pattern.    Squamous cell carcinoma of skin 10/07/2016   Left lateral calf superior. WD SCC.   Squamous cell carcinoma of skin 10/07/2016   Left lateral calf inferior. WD SCC.   Squamous cell carcinoma of skin 04/28/2018   Left mid med. pretibial. WD SCC.   Squamous cell carcinoma of skin 08/12/2019   Left mid med. pretibial sup. KA type. EDC  Squamous cell carcinoma of skin 08/12/2019   Left mid med. pretibial inferior. WD. EDC.   Tear of medial meniscus of knee, left 04/27/2009   Type II diabetes mellitus 08/28/2006   Overview:  Patient has her ups and downs, go in line with flares of her RA and prednisone uses.  Patient does have hx of hypoglycemic events so would not try for perfect control but should have A1c goal around 7.0 Lab Results  Component Value Date   HGBA1C 6.9 12/27/2011   Last Assessment & Plan:  At goal. Will continue current metformin     Past Surgical History:  Procedure Laterality Date   ABDOMINAL HYSTERECTOMY     APPENDECTOMY     BACK SURGERY     BIOPSY  05/10/2021   Procedure: BIOPSY;  Surgeon: Rachael Fee, MD;  Location: [facility];  Service: Endoscopy;;  EGD and COLON   CARPAL TUNNEL RELEASE Right    CATARACT EXTRACTION Left    with lid lift   CHOLECYSTECTOMY     COLONOSCOPY     COLONOSCOPY WITH PROPOFOL N/A 05/10/2021   Procedure: COLONOSCOPY WITH PROPOFOL;  Surgeon: Rachael Fee, MD;  Location: [facility];  Service: Endoscopy;  Laterality: N/A;   ESOPHAGOGASTRODUODENOSCOPY (EGD) WITH PROPOFOL N/A 05/10/2021   Procedure:  ESOPHAGOGASTRODUODENOSCOPY (EGD) WITH PROPOFOL;  Surgeon: Rachael Fee, MD;  Location: [facility];  Service: Endoscopy;  Laterality: N/A;   HERNIA REPAIR     KNEE ARTHROSCOPY WITH MEDIAL MENISECTOMY Right 03/26/2023   Procedure: KNEE ARTHROSCOPY WITH MEDIAL MENISECTOMY;  Surgeon: Bjorn Pippin, MD;  Location: [facility];  Service: Orthopedics;  Laterality: Right;   KNEE SURGERY Right    x2- cartilage annd scar tissue   LEFT AND RIGHT HEART CATHETERIZATION WITH CORONARY ANGIOGRAM N/A 08/17/2013   Procedure: LEFT AND RIGHT HEART CATHETERIZATION WITH CORONARY ANGIOGRAM;  Surgeon: Pamella Pert, MD;  Location: [facility] CATH LAB;  Service: Cardiovascular;  Laterality: N/A;   lumbar back surgery  04/04/2018   done by neurosurgeron   right foot bone spurs removed     RIGHT/LEFT HEART CATH AND CORONARY ANGIOGRAPHY N/A 07/30/2022   Procedure: RIGHT/LEFT HEART CATH AND CORONARY ANGIOGRAPHY;  Surgeon: Elder Negus, MD;  Location: [facility] INVASIVE CV LAB;  Service: Cardiovascular;  Laterality: N/A;   ROTATOR CUFF REPAIR Right    SHOULDER SURGERY Left    x2- rotatar cuff tear   SYNOVECTOMY Right 03/26/2023   Procedure: SYNOVECTOMY;  Surgeon: Bjorn Pippin, MD;  Location: [facility];  Service: Orthopedics;  Laterality: Right;   UPPER GASTROINTESTINAL ENDOSCOPY       PREVIOUS MEDICATIONS:   CURRENT MEDICATIONS:  Outpatient Encounter Medications as of 08/27/2023  Medication Sig   Accu-Chek Softclix Lancets lancets Please use to check blood sugar up to two times per day. E11.42   Blood Glucose Monitoring Suppl (ONE TOUCH ULTRA 2) w/Device KIT USE AS DIRECTED   cetirizine (ZYRTEC) 10 MG tablet Take 10 mg by mouth daily.   cholestyramine (QUESTRAN) 4 g packet Take 1 packet (4 g total) by mouth daily.   clobetasol (TEMOVATE) 0.05 % external solution APPLY 1 APPLICATION TOPICALLY DAILY TO SCALP UP TO 5 TIMES PER WEEK   cromolyn (OPTICROM) 4 % ophthalmic solution Place 1 drop into both eyes 3 (three) times daily  as needed (itchy eyes).   Cyanocobalamin (B-12) 5000 MCG CAPS Take 5,000 mcg by mouth daily.   diclofenac Sodium (VOLTAREN) 1 % GEL Apply 2 g topically 4 (four) times daily as  needed (pain).   diphenhydramine-acetaminophen (TYLENOL PM) 25-500 MG TABS tablet Take 2 tablets by mouth at bedtime as needed (sleep).   Dulaglutide (TRULICITY) 3 MG/0.5ML SOAJ Inject 3 mg as directed once a week.   DULERA 100-5 MCG/ACT AERO Inhale 2 puffs into the lungs 2 (two) times daily.   fluticasone (FLONASE) 50 MCG/ACT nasal spray Place 2 sprays into both nostrils daily as needed for allergies.   gabapentin (NEURONTIN) 300 MG capsule TAKE 1 CAPSULE BY MOUTH EVERYDAY AT BEDTIME   glucose blood test strip Use as instructed a few times a week as needed   glucose blood test strip Use to check blood glucose 2 times a day as needed   leflunomide (ARAVA) 10 MG tablet Take 10 mg by mouth daily.   meloxicam (MOBIC) 15 MG tablet Take 1 tablet (15 mg total) by mouth daily.   metoprolol succinate (TOPROL-XL) 50 MG 24 hr tablet Take 1 tablet (50 mg total) by mouth daily. Take with or immediately following a meal.   mirabegron ER (MYRBETRIQ) 25 MG TB24 tablet Take 25 mg by mouth daily.   mometasone (ELOCON) 0.1 % cream APPLY 1 APPLICATION TOPICALLY DAILY AS NEEDED (RASH).   montelukast (SINGULAIR) 10 MG tablet TAKE 1 TABLET BY MOUTH EVERY DAY (Patient taking differently: Take 10 mg by mouth at bedtime.)   mupirocin ointment (BACTROBAN) 2 % APPLY  OINTMENT TOPICALLY ONCE DAILY TO OPEN SORES ON ARMS, LEGS, TOES UNITL HEALED, THEN APPLY AS NEEDED FOR FLARES   omeprazole (PRILOSEC) 40 MG capsule Take 1 capsule by mouth in the morning   oxyCODONE-acetaminophen (PERCOCET) 10-325 MG tablet Take 1 tablet by mouth every 6 (six) hours as needed for pain.   rosuvastatin (CRESTOR) 10 MG tablet Take 1 tablet (10 mg total) by mouth daily.   sacubitril-valsartan (ENTRESTO) 24-26 MG Take 1 tablet by mouth twice daily   sertraline (ZOLOFT) 50 MG  tablet TAKE 1 TABLET BY MOUTH EVERY DAY   Thiamine HCl (VITAMIN B-1) 250 MG tablet Take 250 mg by mouth daily.   VENTOLIN HFA 108 (90 Base) MCG/ACT inhaler INHALE 2 PUFFS BY MOUTH EVERY 4 HOURS AS NEEDED FOR WHEEZING OR SHORTNESS OF BREATH   metFORMIN (GLUCOPHAGE-XR) 500 MG 24 hr tablet Take 2 tablets (1,000 mg total) by mouth 2 (two) times daily.   nitroGLYCERIN (NITROSTAT) 0.4 MG SL tablet Place 1 tablet (0.4 mg total) under the tongue every 5 (five) minutes as needed. (Patient not taking: Reported on 08/27/2023)   No facility-administered encounter medications on file as of 08/27/2023.     Objective:     PHYSICAL EXAMINATION:    VITALS:   Vitals:   08/27/23 0914  BP: 109/64  Pulse: 85  Resp: 18  SpO2: 98%  Weight: 131 lb (59.4 kg)  Height: 5\' 2"  (1.575 m)    GEN:  The patient appears stated age and is in NAD. HEENT:  Normocephalic, atraumatic.   Neurological examination:  General: NAD, well-groomed, appears stated age. Orientation: The patient is alert. Oriented to person, place and date Cranial nerves: There is good facial symmetry.The speech is fluent and clear. No aphasia or dysarthria. Fund of knowledge is appropriate. Recent memory impaired and remote memory is normal.  Attention and concentration are normal.  Able to name objects and repeat phrases.  Hearing is intact to conversational tone, delayed recall 3/3. Sensation: Sensation is intact to light touch throughout Motor: Strength is at least antigravity x4. DTR's 2/4 in UE/LE      08/12/2022  10:00 AM 12/11/2018   11:33 AM  Montreal Cognitive Assessment   Visuospatial/ Executive (0/5) 3 2  Naming (0/3) 3 3  Attention: Read list of digits (0/2) 2 2  Attention: Read list of letters (0/1) 1 0  Attention: Serial 7 subtraction starting at 100 (0/3) 3 1  Language: Repeat phrase (0/2) 2 1  Language : Fluency (0/1) 1 1  Abstraction (0/2) 2 2  Delayed Recall (0/5) 5 5  Orientation (0/6) 6 5  Total 28 22  Adjusted  Score (based on education) 29 23       08/27/2023   11:00 AM  MMSE - Mini Mental State Exam  Orientation to time 5  Orientation to Place 5  Registration 3  Attention/ Calculation 5  Recall 3  Language- name 2 objects 2  Language- repeat 1  Language- follow 3 step command 3  Language- read & follow direction 1  Write a sentence 1  Copy design 1  Total score 30       Movement examination: Tone: There is normal tone in the UE/LE Abnormal movements: L mild intention tremor, no resting or postural tremor.  No myoclonus.  No asterixis.   Coordination:  There is no decremation with RAM's. Normal finger to nose  Gait and Station: The patient has no difficulty arising out of a deep-seated chair without the use of the hands. The patient's stride length is good.  Gait is cautious and narrow.   Thank you for allowing [place] the opportunity to participate in the care of this nice patient. Please do not hesitate to contact us for any questions or concerns.   Total time spent on today's visit was 20 minutes dedicated to this patient today, preparing to see patient, examining the patient, ordering tests and/or medications and counseling the patient, documenting clinical information in the EHR or other health record, independently interpreting results and communicating results to the patient/family, discussing treatment and goals, answering patient's questions and coordinating care.  Cc:  Vonna Drafts, MD  Marlowe Kays 08/27/2023 11:15 AM

## 2023-08-29 ENCOUNTER — Other Ambulatory Visit: Payer: Self-pay | Admitting: Family Medicine

## 2023-08-29 DIAGNOSIS — E1142 Type 2 diabetes mellitus with diabetic polyneuropathy: Secondary | ICD-10-CM

## 2023-09-01 DIAGNOSIS — M47816 Spondylosis without myelopathy or radiculopathy, lumbar region: Secondary | ICD-10-CM | POA: Diagnosis not present

## 2023-09-02 ENCOUNTER — Other Ambulatory Visit: Payer: Self-pay | Admitting: *Deleted

## 2023-09-02 ENCOUNTER — Encounter: Payer: Self-pay | Admitting: *Deleted

## 2023-09-02 ENCOUNTER — Encounter: Payer: Self-pay | Admitting: Family Medicine

## 2023-09-02 ENCOUNTER — Ambulatory Visit: Payer: 59 | Admitting: Family Medicine

## 2023-09-02 VITALS — BP 110/60 | HR 61 | Ht 62.0 in | Wt 132.0 lb

## 2023-09-02 DIAGNOSIS — G8929 Other chronic pain: Secondary | ICD-10-CM | POA: Diagnosis not present

## 2023-09-02 DIAGNOSIS — K219 Gastro-esophageal reflux disease without esophagitis: Secondary | ICD-10-CM

## 2023-09-02 DIAGNOSIS — M059 Rheumatoid arthritis with rheumatoid factor, unspecified: Secondary | ICD-10-CM | POA: Diagnosis not present

## 2023-09-02 DIAGNOSIS — E1142 Type 2 diabetes mellitus with diabetic polyneuropathy: Secondary | ICD-10-CM | POA: Diagnosis not present

## 2023-09-02 LAB — POCT GLYCOSYLATED HEMOGLOBIN (HGB A1C): HbA1c, POC (controlled diabetic range): 6.3 % (ref 0.0–7.0)

## 2023-09-02 MED ORDER — OMEPRAZOLE 40 MG PO CPDR: 40.0000 mg | DELAYED_RELEASE_CAPSULE | Freq: Every morning | ORAL | 0 refills | Status: DC

## 2023-09-02 MED ORDER — TRULICITY 3 MG/0.5ML ~~LOC~~ SOAJ: 3.0000 mg | SUBCUTANEOUS | 2 refills | Status: DC

## 2023-09-02 MED ORDER — OXYCODONE-ACETAMINOPHEN 10-325 MG PO TABS: 1.0000 | ORAL_TABLET | Freq: Four times a day (QID) | ORAL | 0 refills | Status: DC | PRN

## 2023-09-02 NOTE — Patient Instructions (Signed)
 Visit Information  Thank you for taking time to visit with me today. Please don't hesitate to contact me if I can be of assistance to you before our next scheduled telephone appointment.  Following are the goals we discussed today:   Goals Addressed             This Visit's Progress    RNCM Care Management Expected Outcomes: Monitor, Self-Manage and Reduce Symptoms of: DM       Current Barriers:  Chronic Disease Management support and education needs related to DMII   RNCM Clinical Goal(s):  Patient will verbalize basic understanding of  DMII disease process and self health management plan as evidenced by verbal explanation, recognizing symptoms, lifestyle modifications attend all scheduled medical appointments: with primary care provider and specialist as evidenced by keeping all scheduled appointments demonstrate Improved and Ongoing adherence to prescribed treatment plan for DMII as evidenced by consistent medication compliance, symptom monitoring, continued lifestyle changes continue to work with RN Care Manager to address care management and care coordination needs related to  DMII as evidenced by adherence to CM Team Scheduled appointments through collaboration with RN Care manager, provider, and care team.   Interventions: Evaluation of current treatment plan related to  self management and patient's adherence to plan as established by provider   Diabetes Interventions:  (Status:  Goal Met.) Long Term Goal Assessed patient's understanding of A1c goal: <7% Provided education to patient about basic DM disease process. A1C now under goal. Continues to not check her sugar daily and reports her last sugar was less than 130. Denies any acute changes at this time. Reviewed medications with patient and discussed importance of medication adherence. Reports compliance with all medications Counseled on importance of regular laboratory monitoring as prescribed Discussed plans with patient  for ongoing care management follow up and provided patient with direct contact information for care management team Provided patient with written educational materials related to hypo and hyperglycemia and importance of correct treatment. Reports no signs or symptoms of hyper/hypoglycemic episodes Reviewed scheduled/upcoming provider appointments including:  Advised patient, providing education and rationale, to check cbg at least daily and record, calling provider for findings outside established parameters. RNCM reviewed with patient goal fasting <130 post prandial <180. Review of patient status, including review of consultants reports, relevant laboratory and other test results, and medications completed Screening for signs and symptoms of depression related to chronic disease state  Assessed social determinant of health barriers Eye exam scheduled for April 2025 Lab Results  Component Value Date   HGBA1C 6.3 09/02/2023    Patient Goals/Self-Care Activities: Take all medications as prescribed Attend all scheduled provider appointments Call pharmacy for medication refills 3-7 days in advance of running out of medications Attend church or other social activities Perform all self care activities independently  Perform IADL's (shopping, preparing meals, housekeeping, managing finances) independently Call provider office for new concerns or questions  keep appointment with eye doctor check blood sugar at prescribed times: once daily and when you have symptoms of low or high blood sugar check feet daily for cuts, sores or redness take the blood sugar log to all doctor visits fill half of plate with vegetables manage portion size switch to sugar-free drinks keep feet up while sitting wash and dry feet carefully every day wear comfortable, cotton socks wear comfortable, well-fitting shoes  Follow Up Plan:  Telephone follow up appointment with care management team member scheduled for:   09-30-2023 at 11:00 am  Our next appointment is by telephone on 09-30-2023 at 11:00 am  Please call the care guide team at [phone] if you need to cancel or reschedule your appointment.   If you are experiencing a Mental Health or Behavioral Health Crisis or need someone to talk to, please call the Suicide and Crisis Lifeline: 988 call the [place] National Suicide Prevention Lifeline: (614) 720-9125 or TTY: 320-070-7709 TTY 225-663-6792) to talk to a trained counselor call 1-800-273-TALK (toll free, 24 hour hotline) go to [facility] [address], [place] [phone])   Patient verbalizes understanding of instructions and care plan provided today and agrees to view in MyChart. Active MyChart status and patient understanding of how to access instructions and care plan via MyChart confirmed with patient.     Telephone follow up appointment with care management team member scheduled for:09-30-2023 at 11:00 am  Larey Brick, BSN RN [facility], [facility] RN Care Manager Direct Dial: [phone]  Fax: [phone]

## 2023-09-02 NOTE — Assessment & Plan Note (Addendum)
 Stable, improved, A1c 6.3 today from 8.2 at last visit.  Continue current regimen of metformin and Trulicity.  Follow-up in 3 months, consider de-escalating therapy if continued improvement.

## 2023-09-02 NOTE — Patient Outreach (Signed)
 Care Management   Visit Note  09/02/2023 Name: Michelle [name] MRN: [id number] DOB: 03-28-1951  Subjective: Michelle [name] is a 73 y.o. year old female who is a primary care patient of Vonna Drafts, MD. The Care Management team was consulted for assistance.      Engaged with patient spoke with patient by telephone.    Goals Addressed             This Visit's Progress    RNCM Care Management Expected Outcomes: Monitor, Self-Manage and Reduce Symptoms of: DM       Current Barriers:  Chronic Disease Management support and education needs related to DMII   RNCM Clinical Goal(s):  Patient will verbalize basic understanding of  DMII disease process and self health management plan as evidenced by verbal explanation, recognizing symptoms, lifestyle modifications attend all scheduled medical appointments: with primary care provider and specialist as evidenced by keeping all scheduled appointments demonstrate Improved and Ongoing adherence to prescribed treatment plan for DMII as evidenced by consistent medication compliance, symptom monitoring, continued lifestyle changes continue to work with RN Care Manager to address care management and care coordination needs related to  DMII as evidenced by adherence to CM Team Scheduled appointments through collaboration with RN Care manager, provider, and care team.   Interventions: Evaluation of current treatment plan related to  self management and patient's adherence to plan as established by provider   Diabetes Interventions:  (Status:  Goal Met.) Long Term Goal Assessed patient's understanding of A1c goal: <7% Provided education to patient about basic DM disease process. A1C now under goal. Continues to not check her sugar daily and reports her last sugar was less than 130. Denies any acute changes at this time. Reviewed medications with patient and discussed importance of medication adherence. Reports compliance with all  medications Counseled on importance of regular laboratory monitoring as prescribed Discussed plans with patient for ongoing care management follow up and provided patient with direct contact information for care management team Provided patient with written educational materials related to hypo and hyperglycemia and importance of correct treatment. Reports no signs or symptoms of hyper/hypoglycemic episodes Reviewed scheduled/upcoming provider appointments including:  Advised patient, providing education and rationale, to check cbg at least daily and record, calling provider for findings outside established parameters. RNCM reviewed with patient goal fasting <130 post prandial <180. Review of patient status, including review of consultants reports, relevant laboratory and other test results, and medications completed Screening for signs and symptoms of depression related to chronic disease state  Assessed social determinant of health barriers Eye exam scheduled for April 2025 Lab Results  Component Value Date   HGBA1C 6.3 09/02/2023    Patient Goals/Self-Care Activities: Take all medications as prescribed Attend all scheduled provider appointments Call pharmacy for medication refills 3-7 days in advance of running out of medications Attend church or other social activities Perform all self care activities independently  Perform IADL's (shopping, preparing meals, housekeeping, managing finances) independently Call provider office for new concerns or questions  keep appointment with eye doctor check blood sugar at prescribed times: once daily and when you have symptoms of low or high blood sugar check feet daily for cuts, sores or redness take the blood sugar log to all doctor visits fill half of plate with vegetables manage portion size switch to sugar-free drinks keep feet up while sitting wash and dry feet carefully every day wear comfortable, cotton socks wear comfortable,  well-fitting shoes  Follow Up Plan:  Telephone follow up appointment with care management team member scheduled for:  09-30-2023 at 11:00 am           Consent to Services:  Patient was given information about care management services, agreed to services, and gave verbal consent to participate.   Plan: Telephone follow up appointment with care management team member scheduled for:09-30-2023 at 11:00 am  Larey Brick, BSN RN [facility], [facility] RN Care Manager Direct Dial: [phone]  Fax: [phone]

## 2023-09-02 NOTE — Assessment & Plan Note (Addendum)
 Stable.  Refill Percocet.  Discussed precautions and safety.  UDS today

## 2023-09-02 NOTE — Patient Instructions (Addendum)
 A1c is 6.3 today - much improved from last time!  Continue all current medications  I have sent refills of percocet, trulicity, and omeprazole  Continue to follow up with your specialists

## 2023-09-02 NOTE — Progress Notes (Signed)
    SUBJECTIVE:   CHIEF COMPLAINT / HPI:   T2DM -Current medication regimen: Trulicity 3 mg weekly, metformin 1000 mg twice daily.  She is also on Crestor 10 mg daily and gabapentin 300 mg nightly. -Denies polyuria, polydipsia, abdominal pain, chest pain, shortness of breath -Eye exam: UTD  Lab Results  Component Value Date   HGBA1C 6.3 09/02/2023   HGBA1C 8.2 (A) 06/02/2023   HGBA1C 7.6 (A) 03/04/2023   Chronic pain on percocet Needs refill  -rheumatoid arthritis -Had knee surgery in September for torn meniscus -severe pain overall well controlled w percocet -Storing in a safe area -Last dose 2 days ago -Denies cravings or lack of control with medication dosing -Denies side effects -Pain contract signed in 2016  PERTINENT  PMH / PSH: T2DM,   OBJECTIVE:   BP 110/60   Pulse 61   Ht 5\' 2"  (1.575 m)   Wt 132 lb (59.9 kg)   BMI 24.14 kg/m   General: NAD, pleasant, able to participate in exam Cardiac: RRR, no murmurs auscultated Respiratory: CTAB, normal WOB Abdomen: soft, non-tender, non-distended, normoactive bowel sounds Extremities: warm and well perfused, no edema or cyanosis Skin: warm and dry, no rashes noted Neuro: alert, no obvious focal deficits, speech normal Psych: Normal affect and mood  ASSESSMENT/PLAN:   Assessment & Plan Type 2 diabetes mellitus with diabetic polyneuropathy, without long-term current use of insulin (HCC) Stable, improved, A1c 6.3 today from 8.2 at last visit.  Continue current regimen of metformin and Trulicity.  Follow-up in 3 months, consider de-escalating therapy if continued improvement. Encounter for chronic pain management Stable.  Refill Percocet.  Discussed precautions and safety.  UDS today   Vonna Drafts, MD [facility]

## 2023-09-03 ENCOUNTER — Other Ambulatory Visit: Payer: 59 | Admitting: *Deleted

## 2023-09-03 DIAGNOSIS — R350 Frequency of micturition: Secondary | ICD-10-CM | POA: Diagnosis not present

## 2023-09-03 DIAGNOSIS — N3000 Acute cystitis without hematuria: Secondary | ICD-10-CM | POA: Diagnosis not present

## 2023-09-04 ENCOUNTER — Ambulatory Visit (INDEPENDENT_AMBULATORY_CARE_PROVIDER_SITE_OTHER): Payer: 59

## 2023-09-04 VITALS — BP 110/60 | HR 61 | Ht 62.0 in | Wt 132.0 lb

## 2023-09-04 DIAGNOSIS — Z0000 Encounter for general adult medical examination without abnormal findings: Secondary | ICD-10-CM

## 2023-09-04 NOTE — Progress Notes (Signed)
 Subjective:   Michelle [name] is a 73 y.o. who presents for a Medicare Wellness preventive visit.  Visit Complete: Virtual I connected with  STARLYN DROGE on 09/04/23 by a audio enabled telemedicine application and verified that I am speaking with the correct person using two identifiers.  Patient Location: Home  Provider Location: Office/Clinic  I discussed the limitations of evaluation and management by telemedicine. The patient expressed understanding and agreed to proceed.  Vital Signs: Because this visit was a virtual/telehealth visit, some criteria may be missing or patient reported. Any vitals not documented were not able to be obtained and vitals that have been documented are patient reported.  VideoDeclined- This patient declined Librarian, academic. Therefore the visit was completed with audio only.  AWV Questionnaire: No: Patient Medicare AWV questionnaire was not completed prior to this visit.  Cardiac Risk Factors include: advanced age (>46men, >63 women);sedentary lifestyle;diabetes mellitus;dyslipidemia;family history of premature cardiovascular disease;hypertension     Objective:    Today's Vitals   09/04/23 1545  BP: 110/60  Pulse: 61  Weight: 132 lb (59.9 kg)  Height: 5\' 2"  (1.575 m)  PainSc: 0-No pain   Body mass index is 24.14 kg/m.     09/04/2023    3:47 PM 09/02/2023    8:17 AM 08/27/2023    9:20 AM 06/27/2023    8:16 AM 06/02/2023    1:36 PM 03/25/2023    2:09 PM 03/04/2023    8:37 AM  Advanced Directives  Does Patient Have a Medical Advance Directive? No No No No No No No  Would patient like information on creating a medical advance directive? No - Patient declined No - Patient declined No - Patient declined No - Patient declined No - Patient declined      Current Medications (verified) Outpatient Encounter Medications as of 09/04/2023  Medication Sig   Accu-Chek Softclix Lancets lancets Please use to check blood sugar  up to two times per day. E11.42 (Patient not taking: Reported on 09/02/2023)   Blood Glucose Monitoring Suppl (ONE TOUCH ULTRA 2) w/Device KIT USE AS DIRECTED   cetirizine (ZYRTEC) 10 MG tablet Take 10 mg by mouth daily.   cholestyramine (QUESTRAN) 4 g packet Take 1 packet (4 g total) by mouth daily. (Patient not taking: Reported on 09/02/2023)   clobetasol (TEMOVATE) 0.05 % external solution APPLY 1 APPLICATION TOPICALLY DAILY TO SCALP UP TO 5 TIMES PER WEEK   cromolyn (OPTICROM) 4 % ophthalmic solution Place 1 drop into both eyes 3 (three) times daily as needed (itchy eyes).   Cyanocobalamin (B-12) 5000 MCG CAPS Take 5,000 mcg by mouth daily.   diclofenac Sodium (VOLTAREN) 1 % GEL Apply 2 g topically 4 (four) times daily as needed (pain).   diphenhydramine-acetaminophen (TYLENOL PM) 25-500 MG TABS tablet Take 2 tablets by mouth at bedtime as needed (sleep).   Dulaglutide (TRULICITY) 3 MG/0.5ML SOAJ Inject 3 mg as directed once a week.   DULERA 100-5 MCG/ACT AERO Inhale 2 puffs into the lungs 2 (two) times daily.   fluticasone (FLONASE) 50 MCG/ACT nasal spray Place 2 sprays into both nostrils daily as needed for allergies.   gabapentin (NEURONTIN) 300 MG capsule TAKE 1 CAPSULE BY MOUTH EVERYDAY AT BEDTIME   glucose blood test strip Use as instructed a few times a week as needed   leflunomide (ARAVA) 10 MG tablet Take 10 mg by mouth daily.   meloxicam (MOBIC) 15 MG tablet Take 1 tablet (15 mg total) by mouth  daily.   metFORMIN (GLUCOPHAGE-XR) 500 MG 24 hr tablet Take 2 tablets (1,000 mg total) by mouth 2 (two) times daily.   metoprolol succinate (TOPROL-XL) 50 MG 24 hr tablet Take 1 tablet (50 mg total) by mouth daily. Take with or immediately following a meal.   mirabegron ER (MYRBETRIQ) 25 MG TB24 tablet Take 25 mg by mouth daily.   mometasone (ELOCON) 0.1 % cream APPLY 1 APPLICATION TOPICALLY DAILY AS NEEDED (RASH).   montelukast (SINGULAIR) 10 MG tablet TAKE 1 TABLET BY MOUTH EVERY DAY (Patient  taking differently: Take 10 mg by mouth at bedtime.)   mupirocin ointment (BACTROBAN) 2 % APPLY  OINTMENT TOPICALLY ONCE DAILY TO OPEN SORES ON ARMS, LEGS, TOES UNITL HEALED, THEN APPLY AS NEEDED FOR FLARES   nitroGLYCERIN (NITROSTAT) 0.4 MG SL tablet Place 1 tablet (0.4 mg total) under the tongue every 5 (five) minutes as needed.   omeprazole (PRILOSEC) 40 MG capsule Take 1 capsule (40 mg total) by mouth every morning.   ONETOUCH ULTRA test strip USE TO CHECK BLOOD GLUCOSE 2 TIMES A DAY AS NEEDED   oxyCODONE-acetaminophen (PERCOCET) 10-325 MG tablet Take 1 tablet by mouth every 6 (six) hours as needed for pain.   rosuvastatin (CRESTOR) 10 MG tablet Take 1 tablet (10 mg total) by mouth daily.   sacubitril-valsartan (ENTRESTO) 24-26 MG Take 1 tablet by mouth twice daily   sertraline (ZOLOFT) 50 MG tablet TAKE 1 TABLET BY MOUTH EVERY DAY   Thiamine HCl (VITAMIN B-1) 250 MG tablet Take 250 mg by mouth daily.   VENTOLIN HFA 108 (90 Base) MCG/ACT inhaler INHALE 2 PUFFS BY MOUTH EVERY 4 HOURS AS NEEDED FOR WHEEZING OR SHORTNESS OF BREATH   No facility-administered encounter medications on file as of 09/04/2023.    Allergies (verified) Zofran [ondansetron hcl], Ace inhibitors, Levofloxacin, Lisinopril, Sulfonamide derivatives, and Sulfa antibiotics   History: Past Medical History:  Diagnosis Date   Actinic keratosis    Allergic rhinitis 08/28/2006   Anemia    low iron   Anginal pain (HCC)    Asthma, chronic 03/14/2010   Does seem to use her albuterol excessively. Consider change to more manageable medicines and treat more like COPD in the future.   Carpal tunnel syndrome, left 05/29/2009   Last Assessment & Plan:  Advised to wear wrist brace at night when she is sleeping.  If this does not help will consider having her go to sports medicine.   CHF (congestive heart failure) (HCC) 07/2022   HFrEF, NYHA Class I   Chronic frontal sinusitis 08/28/2016   Chronic left maxillary sinusitis 08/28/2016    Diverticulosis    Essential hypertension 10/30/2012   Gastroparesis 11/19/2011   DM and chronic narcotics contribute.  Causes dysphagia like symptoms    Generalized anxiety disorder 08/28/2006   GERD (gastroesophageal reflux disease)    Hearing loss 07/19/2015   Hearing aid in right ear   History of cataracts, bilateral    Hyperlipidemia 08/28/2006   Lumbar radiculopathy 04/07/2018   Lung nodules    Nonischemic cardiomyopathy (HCC)    Osteoporosis 08/08/2011   Pain in joint, pelvic region and thigh 11/18/2012   Radial styloid tenosynovitis 08/15/2013   Restless leg syndrome 05/14/2012   Seropositive rheumatoid arthritis 01/08/2010   Patient is being followed by rheumatologist at [facility] patient is on Plaquenil and as well as prednisone.   Changed to methotrexate and prednisone in May of 2013  Patient does have [facility] receives oxycodone 10/325 mg tabs every 6 hours. This has been titrated down from significant doses of methadone previously. Could attempt to tit   Shoulder joint pain 03/25/2011   The patient is compensating due to her not being able to use her left arm right now rotator cuff impingement if he continues to worsen would not be surprised if she does get a tear on this side as well   Squamous cell carcinoma in situ of skin 11/25/2014   Referred to dermatology for excision    Squamous cell carcinoma of skin 12/26/2014   Right distal pretibial. SCC-KA pattern.   Squamous cell carcinoma of skin 03/27/2015   Right inf. lat. knee. SCC-KA pattern.    Squamous cell carcinoma of skin 10/07/2016   Left lateral calf superior. WD SCC.   Squamous cell carcinoma of skin 10/07/2016   Left lateral calf inferior. WD SCC.   Squamous cell carcinoma of skin 04/28/2018   Left mid med. pretibial. WD SCC.   Squamous cell carcinoma of skin 08/12/2019   Left mid med. pretibial sup. KA type. EDC   Squamous cell carcinoma of  skin 08/12/2019   Left mid med. pretibial inferior. WD. EDC.   Tear of medial meniscus of knee, left 04/27/2009   Type II diabetes mellitus 08/28/2006   Overview:  Patient has her ups and downs, go in line with flares of her RA and prednisone uses.  Patient does have hx of hypoglycemic events so would not try for perfect control but should have A1c goal around 7.0 Lab Results  Component Value Date   HGBA1C 6.9 12/27/2011   Last Assessment & Plan:  At goal. Will continue current metformin   Past Surgical History:  Procedure Laterality Date   ABDOMINAL HYSTERECTOMY     APPENDECTOMY     BACK SURGERY     BIOPSY  05/10/2021   Procedure: BIOPSY;  Surgeon: Rachael Fee, MD;  Location: [facility];  Service: Endoscopy;;  EGD and COLON   CARPAL TUNNEL RELEASE Right    CATARACT EXTRACTION Left    with lid lift   CHOLECYSTECTOMY     COLONOSCOPY     COLONOSCOPY WITH PROPOFOL N/A 05/10/2021   Procedure: COLONOSCOPY WITH PROPOFOL;  Surgeon: Rachael Fee, MD;  Location: [facility];  Service: Endoscopy;  Laterality: N/A;   ESOPHAGOGASTRODUODENOSCOPY (EGD) WITH PROPOFOL N/A 05/10/2021   Procedure: ESOPHAGOGASTRODUODENOSCOPY (EGD) WITH PROPOFOL;  Surgeon: Rachael Fee, MD;  Location: [facility];  Service: Endoscopy;  Laterality: N/A;   HERNIA REPAIR     KNEE ARTHROSCOPY WITH MEDIAL MENISECTOMY Right 03/26/2023   Procedure: KNEE ARTHROSCOPY WITH MEDIAL MENISECTOMY;  Surgeon: Bjorn Pippin, MD;  Location: [facility];  Service: Orthopedics;  Laterality: Right;   KNEE SURGERY Right    x2- cartilage annd scar tissue   LEFT AND RIGHT HEART CATHETERIZATION WITH CORONARY ANGIOGRAM N/A 08/17/2013   Procedure: LEFT AND RIGHT HEART CATHETERIZATION WITH CORONARY ANGIOGRAM;  Surgeon: Pamella Pert, MD;  Location: [facility] CATH LAB;  Service: Cardiovascular;  Laterality: N/A;   lumbar back surgery  04/04/2018   done by neurosurgeron   right foot bone spurs removed     RIGHT/LEFT HEART CATH AND CORONARY  ANGIOGRAPHY N/A 07/30/2022   Procedure: RIGHT/LEFT HEART CATH AND CORONARY ANGIOGRAPHY;  Surgeon: Elder Negus, MD;  Location: [facility] INVASIVE CV LAB;  Service: Cardiovascular;  Laterality: N/A;   ROTATOR CUFF REPAIR Right    SHOULDER SURGERY Left    x2- rotatar  cuff tear   SYNOVECTOMY Right 03/26/2023   Procedure: SYNOVECTOMY;  Surgeon: Bjorn Pippin, MD;  Location: [facility];  Service: Orthopedics;  Laterality: Right;   UPPER GASTROINTESTINAL ENDOSCOPY     Family History  Problem Relation Age of Onset   Stroke Father 75   Colon cancer Maternal Aunt    Tremor Mother    Lung cancer Brother 1   Rheum arthritis Sister 53   Colon cancer Daughter    COPD Sister    Cancer Sister        in nose   Diabetes Daughter    Hypertension Daughter    Healthy Daughter    Bell's palsy Maternal Grandmother    Esophageal cancer Neg Hx    Rectal cancer Neg Hx    Stomach cancer Neg Hx    Social History   Socioeconomic History   Marital status: Married    Spouse name: Warehouse manager   Number of children: 3   Years of education: 9   Highest education level: 9th grade  Occupational History   Occupation: Retired/disabled    Associate Professor: UNEMPLOYED  Tobacco Use   Smoking status: Former    Current packs/day: 0.00    Average packs/day: 1.5 packs/day for 5.0 years (7.5 ttl pk-yrs)    Types: Cigarettes    Start date: 05/31/1989    Quit date: 05/31/1994    Years since quitting: 29.2    Passive exposure: Past   Smokeless tobacco: Never  Vaping Use   Vaping status: Never Used  Substance and Sexual Activity   Alcohol use: No    Alcohol/week: 0.0 standard drinks of alcohol   Drug use: No   Sexual activity: Not on file  Other Topics Concern   Not on file  Social History Narrative   Pt is on [employer] program [place]   Lives with husband and granddgt.   Lots of family stressors including special-needs grandaughter who lives with them.      Advance Directives: None - Patient's plan is that her husband  will make decisions for her if she were not capable of making informed decisions for herself. (Discussion with Tawanna Cooler McDiarmid, MD 12/11/18)   Full Code: Desires CPR/ACLS  (Discussion with Tawanna Cooler McDiarmid, MD 12/11/18)         Social Drivers of Health   Financial Resource Strain: Low Risk  (09/04/2023)   Overall Financial Resource Strain (CARDIA)    Difficulty of Paying Living Expenses: Not hard at all  Food Insecurity: No Food Insecurity (09/04/2023)   Hunger Vital Sign    Worried About Running Out of Food in the Last Year: Never true    Ran Out of Food in the Last Year: Never true  Transportation Needs: No Transportation Needs (09/04/2023)   PRAPARE - Administrator, Civil Service (Medical): No    Lack of Transportation (Non-Medical): No  Physical Activity: Inactive (09/04/2023)   Exercise Vital Sign    Days of Exercise per Week: 0 days    Minutes of Exercise per Session: 0 min  Stress: No Stress Concern Present (09/04/2023)   [employer] of Occupational Health - Occupational Stress Questionnaire    Feeling of Stress : Not at all  Social Connections: Moderately Integrated (09/04/2023)   Social Connection and Isolation Panel [NHANES]    Frequency of Communication with Friends and Family: More than three times a week    Frequency of Social Gatherings with Friends and Family: More than three times a week  Attends Religious Services: 1 to 4 times per year    Active Member of Clubs or Organizations: No    Attends Banker Meetings: Never    Marital Status: Married    Tobacco Counseling Counseling given: Not Answered    Clinical Intake:  Pre-visit preparation completed: Yes  Pain : No/denies pain Pain Score: 0-No pain     BMI - recorded: 24.14 Nutritional Status: BMI of 19-24  Normal Nutritional Risks: None Diabetes: Yes CBG done?: No Did pt. bring in CBG monitor from home?: No  How often do you need to have someone help you when you read  instructions, pamphlets, or other written materials from your doctor or pharmacy?: 1 - Never What is the last grade level you completed in school?: 9TH GRADE  Interpreter Needed?: No  Information entered by :: Saina Waage N. Eldine Rencher, LPN.   Activities of Daily Living     09/04/2023    3:50 PM 03/25/2023    2:10 PM  In your present state of health, do you have any difficulty performing the following activities:  Hearing? 1   Vision? 0   Difficulty concentrating or making decisions? 0   Comment BSE: reading, games on phone, wordl, etc.   Walking or climbing stairs? 0   Dressing or bathing? 0   Doing errands, shopping? 0 0  Preparing Food and eating ? N   Using the Toilet? N   In the past six months, have you accidently leaked urine? N   Do you have problems with loss of bowel control? N   Managing your Medications? N   Managing your Finances? N   Housekeeping or managing your Housekeeping? N     Patient Care Team: Vonna Drafts, MD as PCP - General (Family Medicine) Tessa Lerner, DO as PCP - Cardiology (Cardiology) Glendale Chard, DO as Consulting Physician (Neurology) Ricky Stabs, RN as VBCI Care Management (General Practice) Martin Majestic as Consulting Physician (Optometry)  Indicate any recent Medical Services you may have received from other than [facility] providers in the past year (date may be approximate).     Assessment:   This is a routine wellness examination for Tomiko.  Hearing/Vision screen Hearing Screening - Comments:: Patient has hearing loss; has hearing aids. Vision Screening - Comments:: Wears rx glasses - up to date with routine eye exams with Martin Majestic, OD.    Goals Addressed             This Visit's Progress    Patient Stated       "To take it day by day."       Depression Screen     09/04/2023    3:59 PM 09/02/2023    8:18 AM 08/06/2023    3:55 PM 06/27/2023    8:15 AM 06/02/2023    1:37 PM 04/29/2023    1:23 PM 03/04/2023    8:36 AM   PHQ 2/9 Scores  PHQ - 2 Score 1 1 1 3 1 2 1   PHQ- 9 Score 5 5  10 3 6 4     Fall Risk     09/04/2023    3:48 PM 09/02/2023    3:47 PM 09/02/2023    8:18 AM 08/27/2023    9:20 AM 06/27/2023    8:15 AM  Fall Risk   Falls in the past year? 0 0 0 0 0  Number falls in past yr: 0 0 0 0 0  Injury with Fall? 0 0 0 0  0  Risk for fall due to : No Fall Risks      Follow up Falls prevention discussed;Falls evaluation completed Falls evaluation completed  Falls evaluation completed     MEDICARE RISK AT HOME:  Medicare Risk at Home Any stairs in or around the home?: Yes (front porch 3 steps) If so, are there any without handrails?: No Home free of loose throw rugs in walkways, pet beds, electrical cords, etc?: Yes Adequate lighting in your home to reduce risk of falls?: Yes Life alert?: No Use of a cane, walker or w/c?: No Grab bars in the bathroom?: Yes Shower chair or bench in shower?: No Elevated toilet seat or a handicapped toilet?: Yes  TIMED UP AND GO:  Was the test performed?  No  Cognitive Function: 6CIT completed    08/27/2023   11:00 AM  MMSE - Mini Mental State Exam  Orientation to time 5  Orientation to Place 5  Registration 3  Attention/ Calculation 5  Recall 3  Language- name 2 objects 2  Language- repeat 1  Language- follow 3 step command 3  Language- read & follow direction 1  Write a sentence 1  Copy design 1  Total score 30      08/12/2022   10:00 AM 12/11/2018   11:33 AM  Montreal Cognitive Assessment   Visuospatial/ Executive (0/5) 3 2  Naming (0/3) 3 3  Attention: Read list of digits (0/2) 2 2  Attention: Read list of letters (0/1) 1 0  Attention: Serial 7 subtraction starting at 100 (0/3) 3 1  Language: Repeat phrase (0/2) 2 1  Language : Fluency (0/1) 1 1  Abstraction (0/2) 2 2  Delayed Recall (0/5) 5 5  Orientation (0/6) 6 5  Total 28 22  Adjusted Score (based on education) 29 23      09/04/2023    3:49 PM 09/13/2022    9:10 AM  6CIT Screen   What Year? 0 points 0 points  What month? 0 points 0 points  What time? 0 points 0 points  Count back from 20 0 points 0 points  Months in reverse 0 points 0 points  Repeat phrase 0 points 0 points  Total Score 0 points 0 points    Immunizations Immunization History  Administered Date(s) Administered   Fluad Quad(high Dose 65+) 03/02/2020, 04/18/2021   Fluad Trivalent(High Dose 65+) 06/02/2023   Influenza Split 03/13/2012, 04/16/2022   Influenza Whole 06/12/2007, 04/05/2008, 05/02/2009, 04/06/2010   Influenza, High Dose Seasonal PF 03/13/2018, 02/18/2019   Influenza,inj,Quad PF,6+ Mos 03/16/2013, 03/02/2014, 04/12/2015, 04/02/2016, 03/26/2017   Influenza-Unspecified 03/30/2013, 04/03/2015, 03/01/2017, 03/13/2018, 02/27/2019   Meningococcal polysaccharide vaccine (MPSV4) 04/03/2015   PFIZER Comirnaty(Gray Top)Covid-19 Tri-Sucrose Vaccine 10/19/2020   PFIZER(Purple Top)SARS-COV-2 Vaccination 08/14/2019, 09/08/2019, 03/02/2020   PPD Test 03/11/2012   Pfizer Covid-19 Vaccine Bivalent Booster 29yrs & up 04/30/2021, 04/30/2021   Pfizer(Comirnaty)Fall Seasonal Vaccine 12 years and older 03/31/2022, 04/16/2022, 05/01/2023   Pneumococcal Conjugate-13 04/12/2015, 09/19/2016   Pneumococcal Polysaccharide-23 07/20/2015, 10/01/2017   Rsv, Bivalent, Protein Subunit Rsvpref,pf (Abrysvo) 04/16/2022   Td 01/03/2005   Tdap 10/01/2011   Unspecified SARS-COV-2 Vaccination 04/16/2022   Zoster Recombinant(Shingrix) 04/16/2022    Screening Tests Health Maintenance  Topic Date Due   DTaP/Tdap/Td (3 - Td or Tdap) 09/30/2021   FOOT EXAM  07/13/2023   COVID-19 Vaccine (8 - 2024-25 season) 09/18/2023 (Originally 06/26/2023)   OPHTHALMOLOGY EXAM  10/16/2023   Diabetic kidney evaluation - Urine ACR  03/03/2024   HEMOGLOBIN A1C  03/04/2024   Diabetic kidney evaluation - eGFR measurement  06/03/2024   Medicare Annual Wellness (AWV)  09/03/2024   MAMMOGRAM  09/04/2024   Colonoscopy  05/10/2026    Pneumonia Vaccine 11+ Years old  Completed   INFLUENZA VACCINE  Completed   DEXA SCAN  Completed   Hepatitis C Screening  Completed   Zoster Vaccines- Shingrix  Completed   HPV VACCINES  Aged Out    Health Maintenance  Health Maintenance Due  Topic Date Due   DTaP/Tdap/Td (3 - Td or Tdap) 09/30/2021   FOOT EXAM  07/13/2023   Health Maintenance Items Addressed: Yes; Patient is due for Diabetic Foot Exam and DTaP vaccine.  Additional Screening:  Vision Screening: Recommended annual ophthalmology exams for early detection of glaucoma and other disorders of the eye.  Dental Screening: Recommended annual dental exams for proper oral hygiene  Community Resource Referral / Chronic Care Management: CRR required this visit?  No   CCM required this visit?  No     Plan:     I have personally reviewed and noted the following in the patient's chart:   Medical and social history Use of alcohol, tobacco or illicit drugs  Current medications and supplements including opioid prescriptions. Patient is not currently taking opioid prescriptions. Functional ability and status Nutritional status Physical activity Advanced directives List of other physicians Hospitalizations, surgeries, and ER visits in previous 12 months Vitals Screenings to include cognitive, depression, and falls Referrals and appointments  In addition, I have reviewed and discussed with patient certain preventive protocols, quality metrics, and best practice recommendations. A written personalized care plan for preventive services as well as general preventive health recommendations were provided to patient.     Mickeal Needy, LPN   07/04/863   After Visit Summary: (MyChart) Due to this being a telephonic visit, the after visit summary with patients personalized plan was offered to patient via MyChart   Notes: Please refer to Routing Comments.

## 2023-09-04 NOTE — Patient Instructions (Addendum)
 Michelle [name] , Thank you for taking time to come for your Medicare Wellness Visit. I appreciate your ongoing commitment to your health goals. Please review the following plan we discussed and let me know if I can assist you in the future.   Referrals/Orders/Follow-Ups/Clinician Recommendations: Yes; Keep maintaining your health by keeping your appointments with Dr. Barb Merino and any specialists that you may see.  Call us if you need anything.  Have a great year!!!!  This is a list of the screening recommended for you and due dates:  Health Maintenance  Topic Date Due   DTaP/Tdap/Td vaccine (3 - Td or Tdap) 09/30/2021   Complete foot exam   07/13/2023   COVID-19 Vaccine (8 - 2024-25 season) 09/18/2023*   Eye exam for diabetics  10/16/2023   Yearly kidney health urinalysis for diabetes  03/03/2024   Hemoglobin A1C  03/04/2024   Yearly kidney function blood test for diabetes  06/03/2024   Medicare Annual Wellness Visit  09/03/2024   Mammogram  09/04/2024   Colon Cancer Screening  05/10/2026   Pneumonia Vaccine  Completed   Flu Shot  Completed   DEXA scan (bone density measurement)  Completed   Hepatitis C Screening  Completed   Zoster (Shingles) Vaccine  Completed   HPV Vaccine  Aged Out  *Topic was postponed. The date shown is not the original due date.    Advanced directives: (Declined) Advance directive discussed with you today. Even though you declined this today, please call our office should you change your mind, and we can give you the proper paperwork for you to fill out.  Next Medicare Annual Wellness Visit scheduled for next year: Yes

## 2023-09-08 DIAGNOSIS — J310 Chronic rhinitis: Secondary | ICD-10-CM | POA: Diagnosis not present

## 2023-09-08 DIAGNOSIS — J4540 Moderate persistent asthma, uncomplicated: Secondary | ICD-10-CM | POA: Diagnosis not present

## 2023-09-08 DIAGNOSIS — R059 Cough, unspecified: Secondary | ICD-10-CM | POA: Diagnosis not present

## 2023-09-09 ENCOUNTER — Ambulatory Visit: Payer: 59

## 2023-09-11 ENCOUNTER — Ambulatory Visit: Payer: 59 | Admitting: Gastroenterology

## 2023-09-16 ENCOUNTER — Encounter: Payer: Self-pay | Admitting: Podiatry

## 2023-09-16 ENCOUNTER — Ambulatory Visit: Payer: 59 | Admitting: Podiatry

## 2023-09-16 VITALS — Ht 62.4 in | Wt 132.0 lb

## 2023-09-16 DIAGNOSIS — B351 Tinea unguium: Secondary | ICD-10-CM

## 2023-09-16 DIAGNOSIS — M2041 Other hammer toe(s) (acquired), right foot: Secondary | ICD-10-CM

## 2023-09-16 DIAGNOSIS — E1151 Type 2 diabetes mellitus with diabetic peripheral angiopathy without gangrene: Secondary | ICD-10-CM

## 2023-09-16 DIAGNOSIS — M25512 Pain in left shoulder: Secondary | ICD-10-CM | POA: Diagnosis not present

## 2023-09-16 DIAGNOSIS — G629 Polyneuropathy, unspecified: Secondary | ICD-10-CM

## 2023-09-16 DIAGNOSIS — E119 Type 2 diabetes mellitus without complications: Secondary | ICD-10-CM

## 2023-09-16 DIAGNOSIS — M2012 Hallux valgus (acquired), left foot: Secondary | ICD-10-CM

## 2023-09-16 NOTE — Progress Notes (Signed)
 Michelle DIABETIC FOOT EXAM  Subjective: Michelle [name] presents today for Michelle diabetic foot exam.  Chief Complaint  Patient presents with   Nail Problem    Pt is here for [facility] last A1C was 6.3 PCP is Dr Barb Merino and LOV was earlier this month.   Patient confirms h/o diabetes.  Patient denies any h/o foot wounds.  Patient has been diagnosed with neuropathy.  Vonna Drafts, MD is patient's PCP.  Past Medical History:  Diagnosis Date   Actinic keratosis    Allergic rhinitis 08/28/2006   Anemia    low iron   Anginal pain (HCC)    Asthma, chronic 03/14/2010   Does seem to use her albuterol excessively. Consider change to more manageable medicines and treat more like COPD in the future.   Carpal tunnel syndrome, left 05/29/2009   Last Assessment & Plan:  Advised to wear wrist brace at night when she is sleeping.  If this does not help will consider having her go to sports medicine.   CHF (congestive heart failure) (HCC) 07/2022   HFrEF, NYHA Class I   Chronic frontal sinusitis 08/28/2016   Chronic left maxillary sinusitis 08/28/2016   Diverticulosis    Essential hypertension 10/30/2012   Gastroparesis 11/19/2011   DM and chronic narcotics contribute.  Causes dysphagia like symptoms    Generalized anxiety disorder 08/28/2006   GERD (gastroesophageal reflux disease)    Hearing loss 07/19/2015   Hearing aid in right ear   History of cataracts, bilateral    Hyperlipidemia 08/28/2006   Lumbar radiculopathy 04/07/2018   Lung nodules    Nonischemic cardiomyopathy (HCC)    Osteoporosis 08/08/2011   Pain in joint, pelvic region and thigh 11/18/2012   Radial styloid tenosynovitis 08/15/2013   Restless leg syndrome 05/14/2012   Seropositive rheumatoid arthritis 01/08/2010   Patient is being followed by rheumatologist at [facility] patient is on Plaquenil and as well as prednisone.   Changed to methotrexate and prednisone in May of 2013  Patient does have a  pain contract here with [facility] receives oxycodone 10/325 mg tabs every 6 hours. This has been titrated down from significant doses of methadone previously. Could attempt to tit   Shoulder joint pain 03/25/2011   The patient is compensating due to her not being able to use her left arm right now rotator cuff impingement if he continues to worsen would not be surprised if she does get a tear on this side as well   Squamous cell carcinoma in situ of skin 11/25/2014   Referred to dermatology for excision    Squamous cell carcinoma of skin 12/26/2014   Right distal pretibial. SCC-KA pattern.   Squamous cell carcinoma of skin 03/27/2015   Right inf. lat. knee. SCC-KA pattern.    Squamous cell carcinoma of skin 10/07/2016   Left lateral calf superior. WD SCC.   Squamous cell carcinoma of skin 10/07/2016   Left lateral calf inferior. WD SCC.   Squamous cell carcinoma of skin 04/28/2018   Left mid med. pretibial. WD SCC.   Squamous cell carcinoma of skin 08/12/2019   Left mid med. pretibial sup. KA type. EDC   Squamous cell carcinoma of skin 08/12/2019   Left mid med. pretibial inferior. WD. EDC.   Tear of medial meniscus of knee, left 04/27/2009   Type II diabetes mellitus 08/28/2006   Overview:  Patient has her ups and downs, go in line with flares of her RA and prednisone  uses.  Patient does have hx of hypoglycemic events so would not try for perfect control but should have A1c goal around 7.0 Lab Results  Component Value Date   HGBA1C 6.9 12/27/2011   Last Assessment & Plan:  At goal. Will continue current metformin   Patient Active Problem List   Diagnosis Date Noted   Chronic HFrEF (heart failure with reduced ejection fraction) (HCC) 11/22/2022   HFrEF (heart failure with reduced ejection fraction) (HCC) 07/19/2022   Murmur, cardiac 07/16/2022   Ganglion cyst of dorsum of left wrist 06/21/2022   Cicatricial entropion of both upper eyelids 10/10/2021   Exposure  keratopathy, bilateral 10/10/2021   Ptosis of both eyelids 10/10/2021   Senile ectropion of both lower eyelids 10/10/2021   Bilateral sacroiliitis (HCC) 05/18/2021   Status post lumbar spinal fusion 09/21/2020   Bowel and bladder incontinence 03/05/2020   Balance problem 12/11/2018   Onychomycosis 12/02/2018   Lumbar radiculopathy 04/07/2018   Urge incontinence of urine 04/18/2017   Iron deficiency anemia due to chronic blood loss 12/27/2016   Chronic frontal sinusitis 08/28/2016   Chronic left maxillary sinusitis 08/28/2016   Hearing loss 07/19/2015   Squamous cell carcinoma in situ of skin 11/25/2014   Encounter for chronic pain management 08/05/2014   Memory difficulties 07/05/2014   Pulmonary nodules 03/02/2014   Weight loss 03/02/2014   Hypertension associated with diabetes (HCC) 10/30/2012   Irritable bowel 07/13/2012   Restless leg syndrome 05/14/2012   Gastroparesis 11/19/2011   Dysphagia 09/10/2011   Osteoporosis 08/08/2011   Osteopenia 08/08/2011   Restrictive lung disease 08/09/2010   Asthma, chronic 03/14/2010   Seropositive rheumatoid arthritis 01/08/2010   GERD (gastroesophageal reflux disease) 08/22/2008   Type II diabetes mellitus (HCC) 08/28/2006   Hyperlipidemia associated with type 2 diabetes mellitus (HCC) 08/28/2006   Generalized anxiety disorder 08/28/2006   Allergic rhinitis 08/28/2006   Past Surgical History:  Procedure Laterality Date   ABDOMINAL HYSTERECTOMY     APPENDECTOMY     BACK SURGERY     BIOPSY  05/10/2021   Procedure: BIOPSY;  Surgeon: Rachael Fee, MD;  Location: [facility];  Service: Endoscopy;;  EGD and COLON   CARPAL TUNNEL RELEASE Right    CATARACT EXTRACTION Left    with lid lift   CHOLECYSTECTOMY     COLONOSCOPY     COLONOSCOPY WITH PROPOFOL N/A 05/10/2021   Procedure: COLONOSCOPY WITH PROPOFOL;  Surgeon: Rachael Fee, MD;  Location: [facility];  Service: Endoscopy;  Laterality: N/A;   ESOPHAGOGASTRODUODENOSCOPY  (EGD) WITH PROPOFOL N/A 05/10/2021   Procedure: ESOPHAGOGASTRODUODENOSCOPY (EGD) WITH PROPOFOL;  Surgeon: Rachael Fee, MD;  Location: [facility];  Service: Endoscopy;  Laterality: N/A;   HERNIA REPAIR     KNEE ARTHROSCOPY WITH MEDIAL MENISECTOMY Right 03/26/2023   Procedure: KNEE ARTHROSCOPY WITH MEDIAL MENISECTOMY;  Surgeon: Bjorn Pippin, MD;  Location: [facility] ORS;  Service: Orthopedics;  Laterality: Right;   KNEE SURGERY Right    x2- cartilage annd scar tissue   LEFT AND RIGHT HEART CATHETERIZATION WITH CORONARY ANGIOGRAM N/A 08/17/2013   Procedure: LEFT AND RIGHT HEART CATHETERIZATION WITH CORONARY ANGIOGRAM;  Surgeon: Pamella Pert, MD;  [facility] CATH LAB;  Service: Cardiovascular;  Laterality: N/A;   lumbar back surgery  04/04/2018   done by neurosurgeron   right foot bone spurs removed     RIGHT/LEFT HEART CATH AND CORONARY ANGIOGRAPHY N/A 07/30/2022   Procedure: RIGHT/LEFT HEART CATH AND CORONARY ANGIOGRAPHY;  Surgeon: Elder Negus, MD;  Location: [facility] INVASIVE CV LAB;  Service: Cardiovascular;  Laterality: N/A;   ROTATOR CUFF REPAIR Right    SHOULDER SURGERY Left    x2- rotatar cuff tear   SYNOVECTOMY Right 03/26/2023   Procedure: SYNOVECTOMY;  Surgeon: Bjorn Pippin, MD;  Location: [facility] ORS;  Service: Orthopedics;  Laterality: Right;   UPPER GASTROINTESTINAL ENDOSCOPY     Current Outpatient Medications on File Prior to Visit  Medication Sig Dispense Refill   Accu-Chek Softclix Lancets lancets Please use to check blood sugar up to two times per day. E11.42 200 each 4   Blood Glucose Monitoring Suppl (ONE TOUCH ULTRA 2) w/Device KIT USE AS DIRECTED 1 kit 0   cetirizine (ZYRTEC) 10 MG tablet Take 10 mg by mouth daily.     clobetasol (TEMOVATE) 0.05 % external solution APPLY 1 APPLICATION TOPICALLY DAILY TO SCALP UP TO 5 TIMES PER WEEK 50 mL 1   cromolyn (OPTICROM) 4 % ophthalmic solution Place 1 drop into both eyes 3 (three) times daily as needed (itchy eyes).      Cyanocobalamin (B-12) 5000 MCG CAPS Take 5,000 mcg by mouth daily.     diclofenac Sodium (VOLTAREN) 1 % GEL Apply 2 g topically 4 (four) times daily as needed (pain).     diphenhydramine-acetaminophen (TYLENOL PM) 25-500 MG TABS tablet Take 2 tablets by mouth at bedtime as needed (sleep).     Dulaglutide (TRULICITY) 3 MG/0.5ML SOAJ Inject 3 mg as directed once a week. 2 mL 2   DULERA 100-5 MCG/ACT AERO Inhale 2 puffs into the lungs 2 (two) times daily.     fluticasone (FLONASE) 50 MCG/ACT nasal spray Place 2 sprays into both nostrils daily as needed for allergies. 144 mL 1   gabapentin (NEURONTIN) 300 MG capsule TAKE 1 CAPSULE BY MOUTH EVERYDAY AT BEDTIME 90 capsule 3   glucose blood test strip Use as instructed a few times a week as needed 100 each 12   leflunomide (ARAVA) 10 MG tablet Take 10 mg by mouth daily.     meloxicam (MOBIC) 15 MG tablet Take 1 tablet (15 mg total) by mouth daily. 30 tablet 2   metFORMIN (GLUCOPHAGE-XR) 500 MG 24 hr tablet Take 2 tablets (1,000 mg total) by mouth 2 (two) times daily. 360 tablet 1   metoprolol succinate (TOPROL-XL) 50 MG 24 hr tablet Take 1 tablet (50 mg total) by mouth daily. Take with or immediately following a meal. 90 tablet 3   mirabegron ER (MYRBETRIQ) 25 MG TB24 tablet Take 25 mg by mouth daily.     mometasone (ELOCON) 0.1 % cream APPLY 1 APPLICATION TOPICALLY DAILY AS NEEDED (RASH). 45 g 2   montelukast (SINGULAIR) 10 MG tablet TAKE 1 TABLET BY MOUTH EVERY DAY (Patient taking differently: Take 10 mg by mouth at bedtime.) 30 tablet 0   mupirocin ointment (BACTROBAN) 2 % APPLY  OINTMENT TOPICALLY ONCE DAILY TO OPEN SORES ON ARMS, LEGS, TOES UNITL HEALED, THEN APPLY AS NEEDED FOR FLARES 22 g 0   nitroGLYCERIN (NITROSTAT) 0.4 MG SL tablet Place 1 tablet (0.4 mg total) under the tongue every 5 (five) minutes as needed. 25 tablet 1   omeprazole (PRILOSEC) 40 MG capsule Take 1 capsule (40 mg total) by mouth every morning. 90 capsule 0   ONETOUCH ULTRA  test strip USE TO CHECK BLOOD GLUCOSE 2 TIMES A DAY AS NEEDED 100 each 0   oxyCODONE-acetaminophen (PERCOCET) 10-325 MG tablet Take 1 tablet by mouth every 6 (six) hours as needed for pain.  120 tablet 0   rosuvastatin (CRESTOR) 10 MG tablet Take 1 tablet (10 mg total) by mouth daily. 90 tablet 3   sacubitril-valsartan (ENTRESTO) 24-26 MG Take 1 tablet by mouth twice daily 180 tablet 1   sertraline (ZOLOFT) 50 MG tablet TAKE 1 TABLET BY MOUTH EVERY DAY 90 tablet 3   Thiamine HCl (VITAMIN B-1) 250 MG tablet Take 250 mg by mouth daily.     VENTOLIN HFA 108 (90 Base) MCG/ACT inhaler INHALE 2 PUFFS BY MOUTH EVERY 4 HOURS AS NEEDED FOR WHEEZING OR SHORTNESS OF BREATH 108 each 11   cholestyramine (QUESTRAN) 4 g packet Take 1 packet (4 g total) by mouth daily. (Patient not taking: Reported on 09/16/2023) 60 each 0   No current facility-administered medications on file prior to visit.    Allergies  Allergen Reactions   Zofran [Ondansetron Hcl] Other (See Comments)    Prolong QT   Ace Inhibitors Cough    Occurred with lisinopril   Levofloxacin Other (See Comments)    Felt things on her legs that were not there,made her stomach hurt   Lisinopril Cough   Sulfonamide Derivatives Hives and Itching   Sulfa Antibiotics Hives and Itching   Social History   Occupational History   Occupation: Retired/disabled    Associate Professor: UNEMPLOYED  Tobacco Use   Smoking status: Former    Current packs/day: 0.00    Average packs/day: 1.5 packs/day for 5.0 years (7.5 ttl pk-yrs)    Types: Cigarettes    Start date: 05/31/1989    Quit date: 05/31/1994    Years since quitting: 29.3    Passive exposure: Past   Smokeless tobacco: Never  Vaping Use   Vaping status: Never Used  Substance and Sexual Activity   Alcohol use: No    Alcohol/week: 0.0 standard drinks of alcohol   Drug use: No   Sexual activity: Not on file   Family History  Problem Relation Age of Onset   Stroke Father 26   Colon cancer Maternal Aunt     Tremor Mother    Lung cancer Brother 92   Rheum arthritis Sister 42   Colon cancer Daughter    COPD Sister    Cancer Sister        in nose   Diabetes Daughter    Hypertension Daughter    Healthy Daughter    Bell's palsy Maternal Grandmother    Esophageal cancer Neg Hx    Rectal cancer Neg Hx    Stomach cancer Neg Hx    Immunization History  Administered Date(s) Administered   Fluad Quad(high Dose 65+) 03/02/2020, 04/18/2021   Fluad Trivalent(High Dose 65+) 06/02/2023   Influenza Split 03/13/2012, 04/16/2022   Influenza Whole 06/12/2007, 04/05/2008, 05/02/2009, 04/06/2010   Influenza, High Dose Seasonal PF 03/13/2018, 02/18/2019   Influenza,inj,Quad PF,6+ Mos 03/16/2013, 03/02/2014, 04/12/2015, 04/02/2016, 03/26/2017   Influenza-Unspecified 03/30/2013, 04/03/2015, 03/01/2017, 03/13/2018, 02/27/2019   Meningococcal polysaccharide vaccine (MPSV4) 04/03/2015   PFIZER Comirnaty(Gray Top)Covid-19 Tri-Sucrose Vaccine 10/19/2020   PFIZER(Purple Top)SARS-COV-2 Vaccination 08/14/2019, 09/08/2019, 03/02/2020   PPD Test 03/11/2012   Pfizer Covid-19 Vaccine Bivalent Booster 41yrs & up 04/30/2021, 04/30/2021   Pfizer(Comirnaty)Fall Seasonal Vaccine 12 years and older 03/31/2022, 04/16/2022, 05/01/2023   Pneumococcal Conjugate-13 04/12/2015, 09/19/2016   Pneumococcal Polysaccharide-23 07/20/2015, 10/01/2017   Rsv, Bivalent, Protein Subunit Rsvpref,pf Verdis Frederickson) 04/16/2022   Td 01/03/2005   Tdap 10/01/2011   Unspecified SARS-COV-2 Vaccination 04/16/2022   Zoster Recombinant(Shingrix) 04/16/2022     Review of Systems: Negative except as noted in  the HPI.   Objective: There were no vitals filed for this visit.  ANALLELI GIERKE is a pleasant 73 y.o. female in NAD. AAO X 3.  Diabetic foot exam was performed with the following findings:   Vascular Examination: CFT <3 seconds b/l. Pedal pulses faintly palpable RLE and nonpalpable LLE. Skin temperature gradient warm to warm b/l. No pain  with calf compression. No ischemia or gangrene. No cyanosis or clubbing noted b/l.    Neurological Examination: Sensation grossly intact b/l with 10 gram monofilament. Vibratory sensation intact b/l. Pt has subjective symptoms of neuropathy.  Dermatological Examination: Pedal skin warm and supple b/l. No open wounds b/l. No interdigital macerations. Toenails 1-5 b/l thick, discolored, elongated with subungual debris and pain on dorsal palpation.  No corns, calluses nor porokeratotic lesions noted.  Musculoskeletal Examination: Muscle strength 5/5 to all lower extremity muscle groups bilaterally. HAV with bunion deformity noted b/l LE. Patient ambulates independent of any assistive aids.  Radiographs: None     Lab Results  Component Value Date   HGBA1C 6.3 09/02/2023    ADA Risk Categorization: Low Risk :  Patient has all of the following: Intact protective sensation No prior foot ulcer  No severe deformity Pedal pulses present  High Risk  Patient has one or more of the following: Loss of protective sensation Absent pedal pulses Severe Foot deformity History of foot ulcer  Assessment: 1. Pain due to onychomycosis of toenails of both feet   2. Hallux valgus, acquired, bilateral   3. Acquired hammertoes of both feet   4. Neuropathy   5. Diabetes mellitus with peripheral circulatory disorder (HCC)   6. Encounter for diabetic foot exam [facility])     Plan: Consent given for treatment. Diabetic foot examination performed.. All patient's and/or POA's questions/concerns addressed on today's visit. Mycotic toenails 1-5 debrided in length and girth without incident. Continue foot and shoe inspections daily. Monitor blood glucose per PCP/Endocrinologist's recommendations.Continue soft, supportive shoe gear daily. Report any pedal injuries to medical professional. Call office if there are any quesitons/concerns. -Patient/POA to call should there be question/concern in the interim. Return  in about 3 months (around 12/17/2023).  Freddie Breech, DPM      [place] LOCATION: [address] [place] [address]                   Office [phone]   [facility] LOCATION: [address] [place], [place] [address] Office [phone]

## 2023-09-17 ENCOUNTER — Ambulatory Visit: Payer: 59 | Admitting: Dermatology

## 2023-09-21 ENCOUNTER — Encounter: Payer: Self-pay | Admitting: Podiatry

## 2023-09-24 ENCOUNTER — Other Ambulatory Visit: Payer: Self-pay | Admitting: Dermatology

## 2023-09-24 DIAGNOSIS — T148XXA Other injury of unspecified body region, initial encounter: Secondary | ICD-10-CM

## 2023-09-30 ENCOUNTER — Ambulatory Visit (INDEPENDENT_AMBULATORY_CARE_PROVIDER_SITE_OTHER): Admitting: Dermatology

## 2023-09-30 ENCOUNTER — Ambulatory Visit: Payer: Self-pay

## 2023-09-30 ENCOUNTER — Encounter: Payer: Self-pay | Admitting: Dermatology

## 2023-09-30 DIAGNOSIS — W548XXA Other contact with dog, initial encounter: Secondary | ICD-10-CM

## 2023-09-30 DIAGNOSIS — W908XXA Exposure to other nonionizing radiation, initial encounter: Secondary | ICD-10-CM

## 2023-09-30 DIAGNOSIS — L820 Inflamed seborrheic keratosis: Secondary | ICD-10-CM | POA: Diagnosis not present

## 2023-09-30 DIAGNOSIS — S20319A Abrasion of unspecified front wall of thorax, initial encounter: Secondary | ICD-10-CM

## 2023-09-30 DIAGNOSIS — L814 Other melanin hyperpigmentation: Secondary | ICD-10-CM | POA: Diagnosis not present

## 2023-09-30 DIAGNOSIS — L570 Actinic keratosis: Secondary | ICD-10-CM

## 2023-09-30 DIAGNOSIS — S40811A Abrasion of right upper arm, initial encounter: Secondary | ICD-10-CM

## 2023-09-30 DIAGNOSIS — L578 Other skin changes due to chronic exposure to nonionizing radiation: Secondary | ICD-10-CM | POA: Diagnosis not present

## 2023-09-30 DIAGNOSIS — Z79899 Other long term (current) drug therapy: Secondary | ICD-10-CM

## 2023-09-30 DIAGNOSIS — S40812A Abrasion of left upper arm, initial encounter: Secondary | ICD-10-CM | POA: Diagnosis not present

## 2023-09-30 DIAGNOSIS — L409 Psoriasis, unspecified: Secondary | ICD-10-CM

## 2023-09-30 DIAGNOSIS — T148XXA Other injury of unspecified body region, initial encounter: Secondary | ICD-10-CM

## 2023-09-30 DIAGNOSIS — L821 Other seborrheic keratosis: Secondary | ICD-10-CM | POA: Diagnosis not present

## 2023-09-30 DIAGNOSIS — Z7189 Other specified counseling: Secondary | ICD-10-CM

## 2023-09-30 MED ORDER — MUPIROCIN 2 % EX OINT: TOPICAL_OINTMENT | Freq: Every day | CUTANEOUS | 11 refills | Status: AC

## 2023-09-30 MED ORDER — CLOBETASOL PROPIONATE 0.05 % EX SOLN: 1.0000 | Freq: Every day | CUTANEOUS | 5 refills | Status: AC

## 2023-09-30 NOTE — Patient Outreach (Signed)
 Care Coordination   Follow Up Visit Note   09/30/2023 Name: Michelle [name] MRN: [id number] DOB: May 27, 1951  Michelle [name] is a 73 y.o. year old female who sees Vonna Drafts, MD for primary care. I spoke with  [name] by phone today.  What matters to the patients health and wellness today?  I had a discussion with Michelle [name] today, and I am pleased to report that she has demonstrated significant improvement in her health. Her A1C level is currently 6.3, indicating successful management of her condition. On March 18th, she had a follow-up appointment with Dr. Donzetta Matters for a foot examination and also underwent a hearing assessment. Given her progress in controlling her blood sugar levels, she has effectively achieved her health goals. At this point, I will discontinue the regular phone calls; however, Michelle [name] is aware that should she require further assistance in the future, she can notify her physician, who can then initiate a new referral, enabling [place] to resume our calls.     Goals Addressed             This Visit's Progress    RNCM Care Management Expected Outcomes: Monitor, Self-Manage and Reduce Symptoms of: DM       Current Barriers:  Chronic Disease Management support and education needs related to DMII   RNCM Clinical Goal(s):  Patient will verbalize basic understanding of  DMII disease process and self health management plan as evidenced by verbal explanation, recognizing symptoms, lifestyle modifications attend all scheduled medical appointments: with primary care provider and specialist as evidenced by keeping all scheduled appointments demonstrate Improved and Ongoing adherence to prescribed treatment plan for DMII as evidenced by consistent medication compliance, symptom monitoring, continued lifestyle changes continue to work with RN Care Manager to address care management and care coordination needs related to  DMII as evidenced by adherence to CM Team  Scheduled appointments through collaboration with RN Care manager, provider, and care team.   Interventions: Evaluation of current treatment plan related to  self management and patient's adherence to plan as established by provider   Diabetes Interventions:  (Status:  Goal Met.) Long Term Goal Assessed patient's understanding of A1c goal: <7% Provided education to patient about basic DM disease process. A1C now under goal. Continues to not check her sugar daily and reports her last sugar was less than 130. Denies any acute changes at this time. Reviewed medications with patient and discussed importance of medication adherence. Reports compliance with all medications Counseled on importance of regular laboratory monitoring as prescribed Discussed plans with patient for ongoing care management follow up and provided patient with direct contact information for care management team Provided patient with written educational materials related to hypo and hyperglycemia and importance of correct treatment. Reports no signs or symptoms of hyper/hypoglycemic episodes Reviewed scheduled/upcoming provider appointments including:  Advised patient, providing education and rationale, to check cbg at least daily and record, calling provider for findings outside established parameters. RNCM reviewed with patient goal fasting <130 post prandial <180. Review of patient status, including review of consultants reports, relevant laboratory and other test results, and medications completed Screening for signs and symptoms of depression related to chronic disease state  Assessed social determinant of health barriers Eye exam scheduled for April 2025 Lab Results  Component Value Date   HGBA1C 6.3 09/02/2023    Patient Goals/Self-Care Activities: Take all medications as prescribed Attend all scheduled provider appointments Call pharmacy for medication refills 3-7 days in advance of  running out of  medications Attend church or other social activities Perform all self care activities independently  Perform IADL's (shopping, preparing meals, housekeeping, managing finances) independently Call provider office for new concerns or questions  keep appointment with eye doctor check blood sugar at prescribed times: once daily and when you have symptoms of low or high blood sugar check feet daily for cuts, sores or redness take the blood sugar log to all doctor visits fill half of plate with vegetables manage portion size switch to sugar-free drinks keep feet up while sitting wash and dry feet carefully every day wear comfortable, cotton socks wear comfortable, well-fitting shoes  Follow Up Plan: No follow up scheduled with CM team at this time         SDOH assessments and interventions completed:  No     Care Coordination Interventions:  Yes, provided   Follow up plan: No further intervention required.   Encounter Outcome:  Patient Visit Completed   Juanell Fairly RN, BSN, [facility]  [facility], [facility]  Care Coordinator Phone: [phone]

## 2023-09-30 NOTE — Patient Instructions (Addendum)

## 2023-09-30 NOTE — Progress Notes (Signed)
 Follow-Up Visit   Subjective  Michelle [name] is a 73 y.o. female who presents for the following: Psoriasis scalp, 58m f/u, no treatment at this time, pt said scalp has been clear, uses Clobetasol sol prn, pt could not start [place] due to her heart failure The patient has spots, moles and lesions to be evaluated, some may be new or changing and the patient may have concern these could be cancer.  The following portions of the chart were reviewed this encounter and updated as appropriate: medications, allergies, medical history  Review of Systems:  No other skin or systemic complaints except as noted in HPI or Assessment and Plan.  Objective  Well appearing patient in no apparent distress; mood and affect are within normal limits.  A focused examination was performed of the following areas: Scalp, face, back, arms  Relevant exam findings are noted in the Assessment and Plan.  L post shoulder x 2, face x 6 (8) Pink scaly macules upper back x 3, post neck x 1 (4) Stuck on waxy paps with erythema  Assessment & Plan   PSORIASIS scalp Exam: scalp mostly clear today 2% BSA. Chronic and persistent condition with duration or expected duration over one year. Condition is improving with treatment but not currently at goal. patient denies joint pain  Psoriasis is a chronic non-curable, but treatable genetic/hereditary disease that may have other systemic features affecting other organ systems such as joints (Psoriatic Arthritis). It is associated with an increased risk of inflammatory bowel disease, heart disease, non-alcoholic fatty liver disease, and depression.  Treatments include light and laser treatments; topical medications; and systemic medications including oral and injectables. Treatment Plan: Cont Clobetasol sol qd up to 5d/wk aa scalp, avoid f/g/a  Topical steroids (such as triamcinolone, fluocinolone, fluocinonide, mometasone, clobetasol, halobetasol, betamethasone,  hydrocortisone) can cause thinning and lightening of the skin if they are used for too long in the same area. Your physician has selected the right strength medicine for your problem and area affected on the body. Please use your medication only as directed by your physician to prevent side effects.   Long term medication management.  Patient is using long term (months to years) prescription medication  to control their dermatologic condition.  These medications require periodic monitoring to evaluate for efficacy and side effects and may require periodic laboratory monitoring.   TRAUMATIC INJURY Secondary to puppy Arms, chest Exam: excoriations arms, chest Treatment Plan: Cont Mupirocin ointment to open sores  ACTINIC DAMAGE - chronic, secondary to cumulative UV radiation exposure/sun exposure over time - diffuse scaly erythematous macules with underlying dyspigmentation - Recommend daily broad spectrum sunscreen SPF 30+ to sun-exposed areas, reapply every 2 hours as needed.  - Recommend staying in the shade or wearing long sleeves, sun glasses (UVA+UVB protection) and wide brim hats (4-inch brim around the entire circumference of the hat). - Call for new or changing lesions.  SEBORRHEIC KERATOSIS - Stuck-on, waxy, tan-brown papules and/or plaques  - Benign-appearing - Discussed benign etiology and prognosis. - Observe - Call for any changes  LENTIGINES Exam: scattered tan macules Due to sun exposure Treatment Plan: Benign-appearing, observe. Recommend daily broad spectrum sunscreen SPF 30+ to sun-exposed areas, reapply every 2 hours as needed.  Call for any changes  AK (ACTINIC KERATOSIS) (8) L post shoulder x 2, face x 6 (8) Actinic keratoses are precancerous spots that appear secondary to cumulative UV radiation exposure/sun exposure over time. They are chronic with expected duration over 1 year. A  portion of actinic keratoses will progress to squamous cell carcinoma of the  skin. It is not possible to reliably predict which spots will progress to skin cancer and so treatment is recommended to prevent development of skin cancer.  Recommend daily broad spectrum sunscreen SPF 30+ to sun-exposed areas, reapply every 2 hours as needed.  Recommend staying in the shade or wearing long sleeves, sun glasses (UVA+UVB protection) and wide brim hats (4-inch brim around the entire circumference of the hat). Call for new or changing lesions. Destruction of lesion - L post shoulder x 2, face x 6 (8) Complexity: simple   Destruction method: cryotherapy   Informed consent: discussed and consent obtained   Timeout:  patient name, date of birth, surgical site, and procedure verified Lesion destroyed using liquid nitrogen: Yes   Region frozen until ice ball extended beyond lesion: Yes   Outcome: patient tolerated procedure well with no complications   Post-procedure details: wound care instructions given   INFLAMED SEBORRHEIC KERATOSIS (4) upper back x 3, post neck x 1 (4) Symptomatic, irritating, patient would like treated. Destruction of lesion - upper back x 3, post neck x 1 (4) Complexity: simple   Destruction method: cryotherapy   Informed consent: discussed and consent obtained   Timeout:  patient name, date of birth, surgical site, and procedure verified Lesion destroyed using liquid nitrogen: Yes   Region frozen until ice ball extended beyond lesion: Yes   Outcome: patient tolerated procedure well with no complications   Post-procedure details: wound care instructions given   PSORIASIS   Related Medications mometasone (ELOCON) 0.1 % cream APPLY 1 APPLICATION TOPICALLY DAILY AS NEEDED (RASH). clobetasol (TEMOVATE) 0.05 % external solution Apply 1 Application topically daily. qd up to 5 days a week to aa scalp prn psoriasis flares, avoid face, groin, axilla EXCORIATION   Related Medications mupirocin ointment (BACTROBAN) 2 % Apply topically daily. qd to open  sores on arms, chest  Return in about 1 year (around 09/29/2024) for AK f/u, Psoriasis f/u.  I, Ardis Rowan, RMA, am acting as scribe for Armida Sans, MD .   Documentation: I have reviewed the above documentation for accuracy and completeness, and I agree with the above.  Armida Sans, MD

## 2023-09-30 NOTE — Patient Instructions (Signed)
 Visit Information  Thank you for taking time to visit with me today. Please don't hesitate to contact me if I can be of assistance to you.   Following are the goals we discussed today:   Goals Addressed               This Visit's Progress     COMPLETED: Diabetes Patient stated goal- lower my A1C (pt-stated)        Patient Goals/Self Care Activities: -Patient/Caregiver will take medications as prescribed   -Patient/Caregiver will attend all scheduled provider appointments -Patient/Caregiver will call pharmacy for medication refills 3-7 days in advance of running out of medications -Patient/Caregiver will call provider office for new concerns or questions  -Patient/Caregiver will focus on medication adherence by taking medications as prescribed   Objective:  Lab Results  Component Value Date   HGBA1C 6.3 09/02/2023   Lab Results  Component Value Date   CREATININE 0.61 06/04/2023   CREATININE 0.55 03/05/2023   CREATININE 0.56 11/26/2022   Lab Results  Component Value Date   EGFR 102 08/16/2022    adherence to prescribed medication regimen       contacting provider for new or worsened symptoms or questions      Reviewed medications with patient and discussed importance of medication adherence Make sure to check blood sugars 2 hours after eating Stay hydrated Eat brat diet         COMPLETED: RNCM Care Management Expected Outcomes: Monitor, Self-Manage and Reduce Symptoms of: DM        Current Barriers:  Chronic Disease Management support and education needs related to DMII   RNCM Clinical Goal(s):  Patient will verbalize basic understanding of  DMII disease process and self health management plan as evidenced by verbal explanation, recognizing symptoms, lifestyle modifications attend all scheduled medical appointments: with primary care provider and specialist as evidenced by keeping all scheduled appointments demonstrate Improved and Ongoing adherence to prescribed  treatment plan for DMII as evidenced by consistent medication compliance, symptom monitoring, continued lifestyle changes continue to work with RN Care Manager to address care management and care coordination needs related to  DMII as evidenced by adherence to CM Team Scheduled appointments through collaboration with RN Care manager, provider, and care team.   Interventions: Evaluation of current treatment plan related to  self management and patient's adherence to plan as established by provider   Diabetes Interventions:  (Status:  Goal Met.) Long Term Goal Assessed patient's understanding of A1c goal: <7% Provided education to patient about basic DM disease process. A1C now under goal. Continues to not check her sugar daily and reports her last sugar was less than 130. Denies any acute changes at this time. Reviewed medications with patient and discussed importance of medication adherence. Reports compliance with all medications Counseled on importance of regular laboratory monitoring as prescribed Discussed plans with patient for ongoing care management follow up and provided patient with direct contact information for care management team Provided patient with written educational materials related to hypo and hyperglycemia and importance of correct treatment. Reports no signs or symptoms of hyper/hypoglycemic episodes Reviewed scheduled/upcoming provider appointments including:  Advised patient, providing education and rationale, to check cbg at least daily and record, calling provider for findings outside established parameters. RNCM reviewed with patient goal fasting <130 post prandial <180. Review of patient status, including review of consultants reports, relevant laboratory and other test results, and medications completed Screening for signs and symptoms of depression related to chronic disease  state  Assessed social determinant of health barriers Eye exam scheduled for April 2025 Lab  Results  Component Value Date   HGBA1C 6.3 09/02/2023    Patient Goals/Self-Care Activities: Take all medications as prescribed Attend all scheduled provider appointments Call pharmacy for medication refills 3-7 days in advance of running out of medications Attend church or other social activities Perform all self care activities independently  Perform IADL's (shopping, preparing meals, housekeeping, managing finances) independently Call provider office for new concerns or questions  keep appointment with eye doctor check blood sugar at prescribed times: once daily and when you have symptoms of low or high blood sugar check feet daily for cuts, sores or redness take the blood sugar log to all doctor visits fill half of plate with vegetables manage portion size switch to sugar-free drinks keep feet up while sitting wash and dry feet carefully every day wear comfortable, cotton socks wear comfortable, well-fitting shoes  Follow Up Plan: No follow up scheduled with CM team at this time          If you are experiencing a Mental Health or Behavioral Health Crisis or need someone to talk to, please call 1-800-273-TALK (toll free, 24 hour hotline)  Patient verbalizes understanding of instructions and care plan provided today and agrees to view in MyChart. Active MyChart status and patient understanding of how to access instructions and care plan via MyChart confirmed with patient.     Juanell Fairly RN, BSN, [employer] [place]  [facility], [facility]  Care Coordinator Phone: [phone]

## 2023-10-01 ENCOUNTER — Ambulatory Visit
Admission: RE | Admit: 2023-10-01 | Discharge: 2023-10-01 | Disposition: A | Discharge status: Home / Self Care | Source: Ambulatory Visit

## 2023-10-01 ENCOUNTER — Other Ambulatory Visit: Payer: Self-pay

## 2023-10-01 DIAGNOSIS — Z1231 Encounter for screening mammogram for malignant neoplasm of breast: Secondary | ICD-10-CM

## 2023-10-01 DIAGNOSIS — G8929 Other chronic pain: Secondary | ICD-10-CM

## 2023-10-01 DIAGNOSIS — M059 Rheumatoid arthritis with rheumatoid factor, unspecified: Secondary | ICD-10-CM

## 2023-10-01 MED ORDER — OXYCODONE-ACETAMINOPHEN 10-325 MG PO TABS: 1.0000 | ORAL_TABLET | Freq: Four times a day (QID) | ORAL | 0 refills | Status: DC | PRN

## 2023-10-02 DIAGNOSIS — Z79899 Other long term (current) drug therapy: Secondary | ICD-10-CM | POA: Diagnosis not present

## 2023-10-02 DIAGNOSIS — M25562 Pain in left knee: Secondary | ICD-10-CM | POA: Diagnosis not present

## 2023-10-02 DIAGNOSIS — M0579 Rheumatoid arthritis with rheumatoid factor of multiple sites without organ or systems involvement: Secondary | ICD-10-CM | POA: Diagnosis not present

## 2023-10-02 DIAGNOSIS — M19042 Primary osteoarthritis, left hand: Secondary | ICD-10-CM | POA: Diagnosis not present

## 2023-10-02 DIAGNOSIS — M1990 Unspecified osteoarthritis, unspecified site: Secondary | ICD-10-CM | POA: Diagnosis not present

## 2023-10-02 DIAGNOSIS — Z7969 Long term (current) use of other immunomodulators and immunosuppressants: Secondary | ICD-10-CM | POA: Diagnosis not present

## 2023-10-02 DIAGNOSIS — Z881 Allergy status to other antibiotic agents status: Secondary | ICD-10-CM | POA: Diagnosis not present

## 2023-10-02 DIAGNOSIS — Z882 Allergy status to sulfonamides status: Secondary | ICD-10-CM | POA: Diagnosis not present

## 2023-10-02 DIAGNOSIS — Z96619 Presence of unspecified artificial shoulder joint: Secondary | ICD-10-CM | POA: Diagnosis not present

## 2023-10-02 DIAGNOSIS — Z5181 Encounter for therapeutic drug level monitoring: Secondary | ICD-10-CM | POA: Diagnosis not present

## 2023-10-02 DIAGNOSIS — M059 Rheumatoid arthritis with rheumatoid factor, unspecified: Secondary | ICD-10-CM | POA: Diagnosis not present

## 2023-10-02 DIAGNOSIS — Z96659 Presence of unspecified artificial knee joint: Secondary | ICD-10-CM | POA: Diagnosis not present

## 2023-10-02 DIAGNOSIS — M19041 Primary osteoarthritis, right hand: Secondary | ICD-10-CM | POA: Diagnosis not present

## 2023-10-02 DIAGNOSIS — Z791 Long term (current) use of non-steroidal anti-inflammatories (NSAID): Secondary | ICD-10-CM | POA: Diagnosis not present

## 2023-10-09 ENCOUNTER — Other Ambulatory Visit: Payer: Self-pay | Admitting: Family Medicine

## 2023-10-09 DIAGNOSIS — M47816 Spondylosis without myelopathy or radiculopathy, lumbar region: Secondary | ICD-10-CM | POA: Diagnosis not present

## 2023-10-09 DIAGNOSIS — E1142 Type 2 diabetes mellitus with diabetic polyneuropathy: Secondary | ICD-10-CM

## 2023-10-09 NOTE — Telephone Encounter (Signed)
 Patient called requesting refills on her Sertraline 50MG .   She is requesting refills be sent to [facility] on [address], as she is trying to get away from [facility].   Please send this refill and all refills to pharmacy listed above.   Thanks!

## 2023-10-14 LAB — LAB REPORT - SCANNED

## 2023-10-15 ENCOUNTER — Encounter: Payer: Self-pay | Admitting: Gastroenterology

## 2023-10-15 ENCOUNTER — Ambulatory Visit (INDEPENDENT_AMBULATORY_CARE_PROVIDER_SITE_OTHER): Admitting: Gastroenterology

## 2023-10-15 VITALS — BP 110/60 | HR 86 | Ht 62.0 in | Wt 130.0 lb

## 2023-10-15 DIAGNOSIS — K580 Irritable bowel syndrome with diarrhea: Secondary | ICD-10-CM | POA: Diagnosis not present

## 2023-10-15 DIAGNOSIS — K219 Gastro-esophageal reflux disease without esophagitis: Secondary | ICD-10-CM

## 2023-10-15 NOTE — Patient Instructions (Addendum)
 OTC imodium as needed for diarrhea Continue Omeprazole 40 mg po daily colonoscopy due 05/2026 -follow-up as needed with Dr. Karene Oto  _______________________________________________________  If your blood pressure at your visit was 140/90 or greater, please contact your primary care physician to follow up on this.  _______________________________________________________  If you are age 74 or older, your body mass index should be between 23-30. Your Body mass index is 23.78 kg/m. If this is out of the aforementioned range listed, please consider follow up with your Primary Care Provider.  If you are age 61 or younger, your body mass index should be between 19-25. Your Body mass index is 23.78 kg/m. If this is out of the aformentioned range listed, please consider follow up with your Primary Care Provider.   ________________________________________________________  The [facility] providers would like to encourage you to use MYCHART to communicate with providers for non-urgent requests or questions.  Due to long hold times on the telephone, sending your provider a message by [facility] may be a faster and more efficient way to get a response.  Please allow 48 business hours for a response.  Please remember that this is for non-urgent requests.  _______________________________________________________

## 2023-10-15 NOTE — Progress Notes (Signed)
 Chief Complaint: acid reflux Primary GI Doctor:(previously Dr. Christella Hartigan) Dr. Barron Alvine  HPI:  Patient is a  73  year old female patient, previously known to Dr. Christella Hartigan, with past medical history of CHF, hyperlipidemia, GERD, anxiety, type 2 diabetes, who was self referred to me for a complaint of acid reflux .    Review of pertinent gastrointestinal problems: 1.  History of adenomatous colon polyp.  Colonoscopy October 2019 single subcentimeter adenoma was removed.  Diverticulosis was noted as well.  2.  EGD October 2019 mild gastritis and small hiatal hernia.  Pathology was negative for H. Pylori. 3.  Chronic diarrhea, many of her medicines probably play a role including metformin, Trulicity, arava.  Blood work 2022 celiac sprue serologies negative.  GI pathogen panel 2022 was positive for Campylobacter, treatment with a azithromycin 1 week did not help.  Repeat GI pathogen panel negative, pancreatic elastase was normal.  Sed rate normal.  CRP normal.  04/03/2021 patient last seen by Dr. Christella Hartigan.  They discussed her chronic diarrhea.  Patient was on 3 medications that potentially could worsen her symptoms including the Glucophage and Trulicity.  Dr. Christella Hartigan recommended cholestyramine twice daily as the Imodium was not controlling her diarrhea. Patient was also having dysphagia and Dr. Christella Hartigan ordered EGD/colon. 05/10/21 EGD Impression: - Small hiatal hernia. - The esophagus was normal and was biopsied ( distal and proximal in same jar) and sent to pathology. - Non- specific gastritis, biopsied to check for H. pylori. - The examination was otherwise normal. Path: Focal intestinal metaplasia, negative for dysplasia in stomach. Negative esophageal distal/proximal biopsies. 05/10/21 colonoscopy Impression: - The examined portion of the ileum was normal. - Diverticulosis in the left colon.  - The examination was otherwise normal on direct and retroflexion views. - Biopsies were taken with a cold forceps  from the entire colon for evaluation of microscopic colitis. Path: negative random biopsies  Interval History    Patient presents to follow-up on chronic diarrhea and GERD and establish care with new gastroenterologist.       She has chronic diarrhea that is intermittent, she states she controls it with dietary modifications. She enquires about her next colonoscopy which we discussed. All questions answered.      Patient has history of GERD and it is managed with Omeprazole 40 mg po daily. If she skips a dose she will have severe reflux with regurgitation.   She does note she recently diagnosed with CHF about 6 months ago and placed on Entresto and Metoprolol.  She has been under extra stress recently due to her husband having memory issues and possible alzheimer's. She is his caregiver but has three daughters who live close by to help out.  Wt Readings from Last 3 Encounters:  10/15/23 130 lb (59 kg)  09/16/23 132 lb (59.9 kg)  09/04/23 132 lb (59.9 kg)    Past Medical History:  Diagnosis Date   Actinic keratosis    Allergic rhinitis 08/28/2006   Anemia    low iron   Anginal pain (HCC)    Asthma, chronic 03/14/2010   Does seem to use her albuterol excessively. Consider change to more manageable medicines and treat more like COPD in the future.   Carpal tunnel syndrome, left 05/29/2009   Last Assessment & Plan:  Advised to wear wrist brace at night when she is sleeping.  If this does not help will consider having her go to sports medicine.   CHF (congestive heart failure) (HCC) 07/2022  HFrEF, NYHA Class I   Chronic frontal sinusitis 08/28/2016   Chronic left maxillary sinusitis 08/28/2016   Diverticulosis    Essential hypertension 10/30/2012   Gastroparesis 11/19/2011   DM and chronic narcotics contribute.  Causes dysphagia like symptoms    Generalized anxiety disorder 08/28/2006   GERD (gastroesophageal reflux disease)    Hearing loss 07/19/2015   Hearing aid in right ear    History of cataracts, bilateral    Hyperlipidemia 08/28/2006   Lumbar radiculopathy 04/07/2018   Lung nodules    Nonischemic cardiomyopathy (HCC)    Osteoporosis 08/08/2011   Pain in joint, pelvic region and thigh 11/18/2012   Radial styloid tenosynovitis 08/15/2013   Restless leg syndrome 05/14/2012   Seropositive rheumatoid arthritis 01/08/2010   Patient is being followed by rheumatologist at [facility]  patient is on Plaquenil and as well as prednisone.   Changed to methotrexate and prednisone in [place] of 2013  Patient does have a pain contract here with [facility] receives oxycodone 10/325 mg tabs every 6 hours. This has been titrated down from significant doses of methadone previously. Could attempt to tit   Shoulder joint pain 03/25/2011   The patient is compensating due to her not being able to use her left arm right now rotator cuff impingement if he continues to worsen would not be surprised if she does get a tear on this side as well   Squamous cell carcinoma in situ of skin 11/25/2014   Referred to dermatology for excision    Squamous cell carcinoma of skin 12/26/2014   Right distal pretibial. SCC-KA pattern.   Squamous cell carcinoma of skin 03/27/2015   Right inf. lat. knee. SCC-KA pattern.    Squamous cell carcinoma of skin 10/07/2016   Left lateral calf superior. WD SCC.   Squamous cell carcinoma of skin 10/07/2016   Left lateral calf inferior. WD SCC.   Squamous cell carcinoma of skin 04/28/2018   Left mid med. pretibial. WD SCC.   Squamous cell carcinoma of skin 08/12/2019   Left mid med. pretibial sup. KA type. EDC   Squamous cell carcinoma of skin 08/12/2019   Left mid med. pretibial inferior. WD. EDC.   Tear of medial meniscus of knee, left 04/27/2009   Type II diabetes mellitus 08/28/2006   Overview:  Patient has her ups and downs, go in line with flares of her RA and prednisone uses.  Patient does have hx of hypoglycemic events so  would not try for perfect control but should have A1c goal around 7.0 Lab Results  Component Value Date   HGBA1C 6.9 12/27/2011   Last Assessment & Plan:  At goal. Will continue current metformin    Past Surgical History:  Procedure Laterality Date   ABDOMINAL HYSTERECTOMY     APPENDECTOMY     BACK SURGERY     BIOPSY  05/10/2021   Procedure: BIOPSY;  Surgeon: Janel Medford, MD;  Location: [facility];  Service: Endoscopy;;  EGD and COLON   CARPAL TUNNEL RELEASE Right    CATARACT EXTRACTION Left    with lid lift   CHOLECYSTECTOMY     COLONOSCOPY     COLONOSCOPY WITH PROPOFOL N/A 05/10/2021   Procedure: COLONOSCOPY WITH PROPOFOL;  Surgeon: Janel Medford, MD;  Location: [facility];  Service: Endoscopy;  Laterality: N/A;   ESOPHAGOGASTRODUODENOSCOPY (EGD) WITH PROPOFOL N/A 05/10/2021   Procedure: ESOPHAGOGASTRODUODENOSCOPY (EGD) WITH PROPOFOL;  Surgeon: Janel Medford, MD;  Location: [facility];  Service:  Endoscopy;  Laterality: N/A;   HERNIA REPAIR     KNEE ARTHROSCOPY WITH MEDIAL MENISECTOMY Right 03/26/2023   Procedure: KNEE ARTHROSCOPY WITH MEDIAL MENISECTOMY;  Surgeon: Micheline Ahr, MD;  Location: [facility];  Service: Orthopedics;  Laterality: Right;   KNEE SURGERY Right    x2- cartilage annd scar tissue   LEFT AND RIGHT HEART CATHETERIZATION WITH CORONARY ANGIOGRAM N/A 08/17/2013   Procedure: LEFT AND RIGHT HEART CATHETERIZATION WITH CORONARY ANGIOGRAM;  Surgeon: Jessica Morn, MD;  Location: [facility] CATH LAB;  Service: Cardiovascular;  Laterality: N/A;   lumbar back surgery  04/04/2018   done by neurosurgeron   right foot bone spurs removed     RIGHT/LEFT HEART CATH AND CORONARY ANGIOGRAPHY N/A 07/30/2022   Procedure: RIGHT/LEFT HEART CATH AND CORONARY ANGIOGRAPHY;  Surgeon: Cody Das, MD;  Location: [facility] INVASIVE CV LAB;  Service: Cardiovascular;  Laterality: N/A;   ROTATOR CUFF REPAIR Right    SHOULDER SURGERY Left    x2- rotatar cuff tear   SYNOVECTOMY Right  03/26/2023   Procedure: SYNOVECTOMY;  Surgeon: Micheline Ahr, MD;  Location: [facility];  Service: Orthopedics;  Laterality: Right;   UPPER GASTROINTESTINAL ENDOSCOPY      Current Outpatient Medications  Medication Sig Dispense Refill   Accu-Chek Softclix Lancets lancets Please use to check blood sugar up to two times per day. E11.42 200 each 4   Blood Glucose Monitoring Suppl (ONE TOUCH ULTRA 2) w/Device KIT USE AS DIRECTED 1 kit 0   cetirizine (ZYRTEC) 10 MG tablet Take 10 mg by mouth daily.     cholestyramine (QUESTRAN) 4 g packet Take 1 packet (4 g total) by mouth daily. 60 each 0   clobetasol (TEMOVATE) 0.05 % external solution Apply 1 Application topically daily. qd up to 5 days a week to aa scalp prn psoriasis flares, avoid face, groin, axilla 50 mL 5   cromolyn (OPTICROM) 4 % ophthalmic solution Place 1 drop into both eyes 3 (three) times daily as needed (itchy eyes).     Cyanocobalamin (B-12) 5000 MCG CAPS Take 5,000 mcg by mouth daily.     diclofenac Sodium (VOLTAREN) 1 % GEL Apply 2 g topically 4 (four) times daily as needed (pain).     diphenhydramine-acetaminophen (TYLENOL PM) 25-500 MG TABS tablet Take 2 tablets by mouth at bedtime as needed (sleep).     Dulaglutide (TRULICITY) 3 MG/0.5ML SOAJ Inject 3 mg as directed once a week. 2 mL 2   DULERA 100-5 MCG/ACT AERO Inhale 2 puffs into the lungs 2 (two) times daily.     fluticasone (FLONASE) 50 MCG/ACT nasal spray Place 2 sprays into both nostrils daily as needed for allergies. 144 mL 1   gabapentin (NEURONTIN) 300 MG capsule TAKE 1 CAPSULE BY MOUTH EVERYDAY AT BEDTIME 90 capsule 3   glucose blood (ONETOUCH ULTRA) test strip USE  STRIP TO CHECK GLUCOSE TWICE DAILY AS NEEDED 100 each 3   glucose blood test strip Use as instructed a few times a week as needed 100 each 12   leflunomide (ARAVA) 10 MG tablet Take 10 mg by mouth daily.     meloxicam (MOBIC) 15 MG tablet Take 1 tablet (15 mg total) by mouth daily. 30 tablet 2   metFORMIN  (GLUCOPHAGE-XR) 500 MG 24 hr tablet Take 2 tablets (1,000 mg total) by mouth 2 (two) times daily. 360 tablet 1   metoprolol succinate (TOPROL-XL) 50 MG 24 hr tablet Take 1 tablet (50 mg total) by mouth daily. Take  with or immediately following a meal. 90 tablet 3   mirabegron ER (MYRBETRIQ) 25 MG TB24 tablet Take 25 mg by mouth daily.     mometasone (ELOCON) 0.1 % cream APPLY 1 APPLICATION TOPICALLY DAILY AS NEEDED (RASH). 45 g 2   montelukast (SINGULAIR) 10 MG tablet TAKE 1 TABLET BY MOUTH EVERY DAY (Patient taking differently: Take 10 mg by mouth at bedtime.) 30 tablet 0   mupirocin ointment (BACTROBAN) 2 % Apply topically daily. qd to open sores on arms, chest 22 g 11   nitroGLYCERIN (NITROSTAT) 0.4 MG SL tablet Place 1 tablet (0.4 mg total) under the tongue every 5 (five) minutes as needed. 25 tablet 1   omeprazole (PRILOSEC) 40 MG capsule Take 1 capsule (40 mg total) by mouth every morning. 90 capsule 0   oxyCODONE-acetaminophen (PERCOCET) 10-325 MG tablet Take 1 tablet by mouth every 6 (six) hours as needed for pain. 120 tablet 0   rosuvastatin (CRESTOR) 10 MG tablet Take 1 tablet (10 mg total) by mouth daily. 90 tablet 3   sacubitril-valsartan (ENTRESTO) 24-26 MG Take 1 tablet by mouth twice daily 180 tablet 1   sertraline (ZOLOFT) 50 MG tablet TAKE 1 TABLET BY MOUTH EVERY DAY 90 tablet 3   Thiamine HCl (VITAMIN B-1) 250 MG tablet Take 250 mg by mouth daily.     VENTOLIN HFA 108 (90 Base) MCG/ACT inhaler INHALE 2 PUFFS BY MOUTH EVERY 4 HOURS AS NEEDED FOR WHEEZING OR SHORTNESS OF BREATH 108 each 11   No current facility-administered medications for this visit.    Allergies as of 10/15/2023 - Review Complete 10/15/2023  Allergen Reaction Noted   Zofran [ondansetron hcl] Other (See Comments) 04/02/2018   Ace inhibitors Cough 05/31/2014   Levofloxacin Other (See Comments) 09/04/2016   Lisinopril Cough 04/06/2018   Sulfonamide derivatives Hives and Itching    Sulfa antibiotics Hives and  Itching 08/17/2013    Family History  Problem Relation Age of Onset   Stroke Father 58   Colon cancer Maternal Aunt    Tremor Mother    Lung cancer Brother 7   Rheum arthritis Sister 70   Colon cancer Daughter    COPD Sister    Cancer Sister        in nose   Diabetes Daughter    Hypertension Daughter    Healthy Daughter    Bell's palsy Maternal Grandmother    Esophageal cancer Neg Hx    Rectal cancer Neg Hx    Stomach cancer Neg Hx     Review of Systems:    Constitutional: No weight loss, fever, chills, weakness or fatigue HEENT: Eyes: No change in vision               Ears, Nose, Throat:  No change in hearing or congestion Skin: No rash or itching Cardiovascular: No chest pain, chest pressure or palpitations   Respiratory: No SOB or cough Gastrointestinal: See HPI and otherwise negative Genitourinary: No dysuria or change in urinary frequency Neurological: No headache, dizziness or syncope Musculoskeletal: No new muscle or joint pain Hematologic: No bleeding or bruising Psychiatric: No history of depression or anxiety    Physical Exam:  Vital signs: BP 110/60   Pulse 86   Ht 5\' 2"  (1.575 m)   Wt 130 lb (59 kg)   BMI 23.78 kg/m   Constitutional:   Pleasant  female appears to be in NAD, Well developed, Well nourished, alert and cooperative Throat: Oral cavity and pharynx without inflammation, swelling  or lesion.  Respiratory: Respirations even and unlabored. Lungs clear to auscultation bilaterally.   No wheezes, crackles, or rhonchi.  Cardiovascular: Normal S1, S2. Regular rate and rhythm. No peripheral edema, cyanosis or pallor.  Gastrointestinal:  Soft, nondistended, nontender. No rebound or guarding. Normal bowel sounds. No appreciable masses or hepatomegaly. Rectal:  Not performed.  Msk:  Symmetrical without gross deformities. Without edema, no deformity or joint abnormality.  Neurologic:  Alert and  oriented x4;  grossly normal neurologically.  Skin:   Dry  and intact without significant lesions or rashes. Psychiatric: Oriented to person, place and time. Demonstrates good judgement and reason without abnormal affect or behaviors.  RELEVANT LABS AND IMAGING: CBC    Latest Ref Rng & Units 06/04/2023    8:36 AM 03/05/2023    8:34 AM 11/26/2022    9:00 AM  CBC  WBC 4.0 - 10.5 K/uL 8.8  8.5  13.9   Hemoglobin 12.0 - 15.0 g/dL 16.1  09.6  04.5   Hematocrit 36.0 - 46.0 % 37.3  37.3  36.2   Platelets 150 - 400 K/uL 222  211  233      CMP     Latest Ref Rng & Units 06/04/2023    8:36 AM 03/05/2023    8:34 AM 11/26/2022    9:00 AM  CMP  Glucose 70 - 99 mg/dL 409  811  914   BUN 8 - 23 mg/dL 16  11  11    Creatinine 0.44 - 1.00 mg/dL 7.82  9.56  2.13   Sodium 135 - 145 mmol/L 138  139  139   Potassium 3.5 - 5.1 mmol/L 4.3  4.5  4.0   Chloride 98 - 111 mmol/L 104  103  106   CO2 22 - 32 mmol/L 27  29  26    Calcium 8.9 - 10.3 mg/dL 9.5  9.4  8.9   Total Protein 6.5 - 8.1 g/dL 6.5  6.6  6.5   Total Bilirubin <1.2 mg/dL 0.4  0.5  0.4   Alkaline Phos 38 - 126 U/L 58  55  61   AST 15 - 41 U/L 10  15  13    ALT 0 - 44 U/L 9  12  13       Lab Results  Component Value Date   TSH 2.360 12/06/2020     Assessment: Encounter Diagnoses  Name Primary?   Gastroesophageal reflux disease, unspecified whether esophagitis present Yes   Irritable bowel syndrome with diarrhea      73 year old female patient with GERD that is managed on omeprazole 40 mg p.o. daily.  Patient will continue with no changes.  Patient also has history of chronic diarrhea that is managed with dietary modifications and over-the-counter Imodium as needed.  She has had colonoscopy in the past with random biopsy that was negative for microscopic colitis.  We reviewed recall colonoscopy for November 2027.  Patient is currently not having any gastrointestinal issues at this time and will follow-up as needed. Plan: -OTC imodium as needed  -continue Omeprazole 40 mg po daily -recall  colonoscopy 05/2026 -follow-up as needed   Thank you for the courtesy of this consult. Please call me with any questions or concerns.   Aqua Denslow, FNP-C  Gastroenterology 10/15/2023, 10:18 AM  Cc: Edison Gore, MD

## 2023-10-21 DIAGNOSIS — E119 Type 2 diabetes mellitus without complications: Secondary | ICD-10-CM | POA: Diagnosis not present

## 2023-10-21 LAB — HM DIABETES EYE EXAM

## 2023-10-27 NOTE — Progress Notes (Signed)
 Agree with the assessment and plan as outlined by [facility], FNP-C.  Michelle Bottino, DO, [facility]

## 2023-10-30 ENCOUNTER — Other Ambulatory Visit: Payer: Self-pay

## 2023-10-30 DIAGNOSIS — G8929 Other chronic pain: Secondary | ICD-10-CM

## 2023-10-30 DIAGNOSIS — M059 Rheumatoid arthritis with rheumatoid factor, unspecified: Secondary | ICD-10-CM

## 2023-10-30 MED ORDER — OXYCODONE-ACETAMINOPHEN 10-325 MG PO TABS: 1.0000 | ORAL_TABLET | Freq: Four times a day (QID) | ORAL | 0 refills | Status: AC | PRN

## 2023-11-14 DIAGNOSIS — M25561 Pain in right knee: Secondary | ICD-10-CM | POA: Diagnosis not present

## 2023-11-25 NOTE — Progress Notes (Unsigned)
 Cardiology Office Note:  .   Date:  11/26/2023  ID:  Michelle [name], DOB 04-01-1951, MRN [id number] PCP:  Edison Gore, MD  Former Cardiology Providers: Dr. Knox Perl [facility] Providers Cardiologist:  Olinda Bertrand, DO , [facility] (established care 07/22/2022) Electrophysiologist:  None  Click to update primary MD,subspecialty MD or APP then REFRESH:1}    Chief Complaint  Patient presents with   Follow-up    Heart Failure and chest pain follow up.     History of Present Illness: .   Michelle [name] is a 73 y.o. Caucasian female whose past medical history and cardiovascular risk factors includes: Nonischemic cardiomyopathy, HFimpEF, coronary artery calcification (CT scan care everywhere), aortic atherosclerosis, former smoker, non-insulin -dependent diabetes mellitus type 2, hyperlipidemia, hypertension.   Patient was referred to practice for evaluation and management of chronic HF. Her LVEF has improved from from 35-40% to 40-45% and now being followed for HFmrEF.   Left heart catheterization in the past did not illustrate any significant obstructive coronary artery disease and right heart catheterization noted compensated nonischemic cardiomyopathy.  GDMT was uptitrated and most recent echocardiogram from November 2024 notes improvement in LVEF to 40-45%.  SGLT2 inhibitors were not considered given her history of multiple yeast infections in the past.  She presents today for 74-month follow-up visit.    Clinically denies heart failure symptoms since last office visit.  She does endorse precordial pain.  Left-sided, occurs 3-4 times per week, reproducible on palpation, localized, not brought on by effort related activities, does not resolve with rest, usually self-limited, lasting for few seconds.   Review of Systems: .   Review of Systems  Cardiovascular:  Positive for chest pain. Negative for claudication, irregular heartbeat, leg swelling, near-syncope, orthopnea,  palpitations, paroxysmal nocturnal dyspnea and syncope.  Respiratory:  Negative for shortness of breath.   Hematologic/Lymphatic: Negative for bleeding problem.  Neurological:  Positive for paresthesias (tingling in the hands (bilateral)).   Studies Reviewed:   EKG: EKG Interpretation Date/Time:  Wednesday Nov 26 2023 08:09:21 EDT Text Interpretation: Normal sinus rhythm Normal ECG When compared with ECG of 23-May-2023 08:24, No significant change was found Confirmed by Olinda Bertrand [phone]) on 11/26/2023 8:11:02 AM  Echocardiogram: 07/17/2022: LVEF 35-40%, global hypokinesis, grade 1 diastolic impairment, mild MR, estimated RAP 3 mmHg.  November 2024: LVEF 40-45%, global hypokinesis, diastolic dysfunction indeterminate. Right ventricular systolic size and function normal. No significant valvular heart disease. Estimated RAP 3 mmHg    Heart Catheterization: 07/30/2022: LM: Nonexistent. Dual ostial present to LAD and Lcx LAD: Mild medial calcification. No significant disease Lcx: No significant disease RCA: No significant disease   LVEDP 12 mmHg RA: 2 mmHg RV: 19/1 mmHg PA: 20/8 mmHg, mPAP 13 mmHg PCW: 3 mmHg   CO: 4.9 L/min CI: 3.1 L/min/m2   No oxygen step up on shunt run   Well compensated nonischemic cardiomyopathy   ABI 12/04/2021 Right: Resting right ankle-brachial index is within normal range. No evidence of significant right lower extremity arterial disease. The right toe-brachial index is normal.   Left: Resting left ankle-brachial index is within normal range. No evidence of significant left lower extremity arterial disease. The left toe-brachial index is normal.   RADIOLOGY: NA  Risk Assessment/Calculations:   NA   Labs:       Latest Ref Rng & Units 06/04/2023    8:36 AM 03/05/2023    8:34 AM 11/26/2022    9:00 AM  CBC  WBC 4.0 - 10.5 K/uL 8.8  8.5  13.9   Hemoglobin 12.0 - 15.0 g/dL 54.0  98.1  19.1   Hematocrit 36.0 - 46.0 % 37.3  37.3  36.2    Platelets 150 - 400 K/uL 222  211  233        Latest Ref Rng & Units 06/04/2023    8:36 AM 03/05/2023    8:34 AM 11/26/2022    9:00 AM  BMP  Glucose 70 - 99 mg/dL 478  295  621   BUN 8 - 23 mg/dL 16  11  11    Creatinine 0.44 - 1.00 mg/dL 3.08  6.57  8.46   Sodium 135 - 145 mmol/L 138  139  139   Potassium 3.5 - 5.1 mmol/L 4.3  4.5  4.0   Chloride 98 - 111 mmol/L 104  103  106   CO2 22 - 32 mmol/L 27  29  26    Calcium  8.9 - 10.3 mg/dL 9.5  9.4  8.9       Latest Ref Rng & Units 06/04/2023    8:36 AM 03/05/2023    8:34 AM 11/26/2022    9:00 AM  CMP  Glucose 70 - 99 mg/dL 962  952  841   BUN 8 - 23 mg/dL 16  11  11    Creatinine 0.44 - 1.00 mg/dL 3.24  4.01  0.27   Sodium 135 - 145 mmol/L 138  139  139   Potassium 3.5 - 5.1 mmol/L 4.3  4.5  4.0   Chloride 98 - 111 mmol/L 104  103  106   CO2 22 - 32 mmol/L 27  29  26    Calcium  8.9 - 10.3 mg/dL 9.5  9.4  8.9   Total Protein 6.5 - 8.1 g/dL 6.5  6.6  6.5   Total Bilirubin <1.2 mg/dL 0.4  0.5  0.4   Alkaline Phos 38 - 126 U/L 58  55  61   AST 15 - 41 U/L 10  15  13    ALT 0 - 44 U/L 9  12  13      Lab Results  Component Value Date   CHOL 117 08/26/2022   HDL 41 08/26/2022   LDLCALC 54 08/26/2022   LDLDIRECT 43 06/01/2020   TRIG 122 08/26/2022   CHOLHDL 2.9 08/26/2022   No results for input(s): "LIPOA" in the last 8760 hours. No components found for: "NTPROBNP" No results for input(s): "PROBNP" in the last 8760 hours.  No results for input(s): "TSH" in the last 8760 hours.  Physical Exam:    Today's Vitals   11/26/23 0806  BP: 120/60  Pulse: 76  Resp: 16  SpO2: 93%  Weight: 127 lb (57.6 kg)  Height: 5\' 2"  (1.575 m)    Body mass index is 23.23 kg/m. Wt Readings from Last 3 Encounters:  11/26/23 127 lb (57.6 kg)  10/15/23 130 lb (59 kg)  09/16/23 132 lb (59.9 kg)    Physical Exam  Constitutional: No distress.  Age appropriate, hemodynamically stable.   HENT:  Poor oral hygiene   Neck: No JVD present.   Cardiovascular: Normal rate, regular rhythm, S1 normal, S2 normal, intact distal pulses and normal pulses. Exam reveals no gallop, no S3 and no S4.  No murmur heard. Pulmonary/Chest: Effort normal and breath sounds normal. No stridor. She has no wheezes. She has no rales.  Abdominal: Soft. Bowel sounds are normal. She exhibits no distension. There is no abdominal tenderness.  Musculoskeletal:  General: No edema.     Cervical back: Neck supple.  Neurological: She is alert and oriented to person, place, and time. She has intact cranial nerves (2-12).  Skin: Skin is warm and moist.     Impression & Recommendation(s):  Impression:   ICD-10-CM   1. Precordial pain  R07.2 ECHOCARDIOGRAM COMPLETE    nitroGLYCERIN  (NITROSTAT ) 0.4 MG SL tablet    2. Chronic heart failure with mildly reduced ejection fraction (HFmrEF, 41-49%) (HCC)  I50.22 EKG 12-Lead    3. Nonischemic cardiomyopathy (HCC)  I42.8     4. Coronary artery calcification seen on computed tomography  I25.10     5. Aortic atherosclerosis (HCC)  I70.0     6. Non-insulin  dependent type 2 diabetes mellitus (HCC)  E11.9     7. Type 2 diabetes mellitus with hyperlipidemia (HCC)  E11.69    E78.5     8. Benign hypertension  I10     9. Former smoker  Z87.891         Recommendation(s):  Precordial pain:  Based on symptoms predominately noncardiac. EKG is nonischemic. Had a heart catheterization in January 2024 which noted no significant obstructive disease. Will hold off on stress testing at this time as overall probability is low.  Will order an echocardiogram to reevaluate LVEF/regional wall motion.  If echocardiogram is abnormal or symptoms persistent additional workup would be warranted. Refill sublingual nitroglycerin  tablets.  Chronic HFrEF (heart failure with reduced ejection fraction) (HCC) Nonischemic cardiomyopathy (HCC) Stage B, NYHA class II. Discovered in January 2024. Most recent angiography notes  nonobstructive CAD with compensated nonischemic cardiomyopathy. Echocardiogram since last office visit notes improvement in LV EF, 40 to 45%.  Continue Entresto  24/26 mg p.o. twice daily. Continue Toprol -XL 50 mg p.o. daily. Continue Crestor  10 mg p.o. daily Not on SGLT2 inhibitors given her history of multiple yeast infections  Coronary artery calcification seen on computed tomography Aortic atherosclerosis (HCC) Has undergone appropriate ischemic workup in Jan 2024.  See above   Non-insulin  dependent type 2 diabetes mellitus (HCC) Type 2 diabetes mellitus with hyperlipidemia (HCC) Reemphasized importance of glycemic control. Most recent hemoglobin A1c 6.3 as of March 2025 LDL 54 mg/dL as of February 1610. Currently on Trulicity    Benign hypertension Office blood pressures are very well-controlled. Medications reconciled. No additional changes warranted at this time  Orders Placed:  Orders Placed This Encounter  Procedures   EKG 12-Lead   ECHOCARDIOGRAM COMPLETE    Standing Status:   Future    Expected Date:   12/03/2023    Expiration Date:   11/25/2024    [facility] this [facility] performed[facility]    Does the patient weigh less than or greater than 250 lbs?:   Patient weighs less than 250 lbs    Perflutren DEFINITY (image enhancing agent) should be administered unless hypersensitivity or allergy exist:   Administer Perflutren    Reason for exam-Echo:   Other-Full Diagnosis List    Full ICD-10/Reason for Exam:   Precordial pain [786.51.ICD-9-CM]    Final Medication List:    Meds ordered this encounter  Medications   nitroGLYCERIN  (NITROSTAT ) 0.4 MG SL tablet    Sig: Place 1 tablet (0.4 mg total) under the tongue every 5 (five) minutes as needed.    Dispense:  30 tablet    Refill:  0    Please dispense two.    Medications Discontinued During This Encounter  Medication Reason   diclofenac  Sodium (VOLTAREN )  1 % GEL Patient Preference   nitroGLYCERIN   (NITROSTAT ) 0.4 MG SL tablet Reorder      Current Outpatient Medications:    Accu-Chek Softclix Lancets lancets, Please use to check blood sugar up to two times per day. E11.42, Disp: 200 each, Rfl: 4   Blood Glucose Monitoring Suppl (ONE TOUCH ULTRA 2) w/Device KIT, USE AS DIRECTED, Disp: 1 kit, Rfl: 0   cetirizine  (ZYRTEC ) 10 MG tablet, Take 10 mg by mouth daily., Disp: , Rfl:    cholestyramine  (QUESTRAN ) 4 g packet, Take 1 packet (4 g total) by mouth daily., Disp: 60 each, Rfl: 0   clobetasol  (TEMOVATE ) 0.05 % external solution, Apply 1 Application topically daily. qd up to 5 days a week to aa scalp prn psoriasis flares, avoid face, groin, axilla, Disp: 50 mL, Rfl: 5   cromolyn  (OPTICROM ) 4 % ophthalmic solution, Place 1 drop into both eyes 3 (three) times daily as needed (itchy eyes)., Disp: , Rfl:    Cyanocobalamin  (B-12) 5000 MCG CAPS, Take 5,000 mcg by mouth daily., Disp: , Rfl:    diphenhydramine -acetaminophen  (TYLENOL  PM) 25-500 MG TABS tablet, Take 2 tablets by mouth at bedtime as needed (sleep)., Disp: , Rfl:    Dulaglutide  (TRULICITY ) 3 MG/0.5ML SOAJ, Inject 3 mg as directed once a week., Disp: 2 mL, Rfl: 2   DULERA  100-5 MCG/ACT AERO, Inhale 2 puffs into the lungs 2 (two) times daily., Disp: , Rfl:    fluticasone  (FLONASE ) 50 MCG/ACT nasal spray, Place 2 sprays into both nostrils daily as needed for allergies., Disp: 144 mL, Rfl: 1   gabapentin  (NEURONTIN ) 300 MG capsule, TAKE 1 CAPSULE BY MOUTH EVERYDAY AT BEDTIME, Disp: 90 capsule, Rfl: 3   glucose blood (ONETOUCH ULTRA) test strip, USE  STRIP TO CHECK GLUCOSE TWICE DAILY AS NEEDED, Disp: 100 each, Rfl: 3   glucose blood test strip, Use as instructed a few times a week as needed, Disp: 100 each, Rfl: 12   leflunomide  (ARAVA ) 10 MG tablet, Take 10 mg by mouth daily., Disp: , Rfl:    meloxicam  (MOBIC ) 15 MG tablet, Take 1 tablet (15 mg total) by mouth daily., Disp: 30 tablet, Rfl: 2   metFORMIN  (GLUCOPHAGE -XR) 500 MG 24 hr tablet,  Take 2 tablets (1,000 mg total) by mouth 2 (two) times daily., Disp: 360 tablet, Rfl: 1   metoprolol  succinate (TOPROL -XL) 50 MG 24 hr tablet, Take 1 tablet (50 mg total) by mouth daily. Take with or immediately following a meal., Disp: 90 tablet, Rfl: 3   mirabegron  ER (MYRBETRIQ ) 25 MG TB24 tablet, Take 25 mg by mouth daily., Disp: , Rfl:    mometasone  (ELOCON ) 0.1 % cream, APPLY 1 APPLICATION TOPICALLY DAILY AS NEEDED (RASH)., Disp: 45 g, Rfl: 2   montelukast  (SINGULAIR ) 10 MG tablet, TAKE 1 TABLET BY MOUTH EVERY DAY (Patient taking differently: Take 10 mg by mouth at bedtime.), Disp: 30 tablet, Rfl: 0   mupirocin  ointment (BACTROBAN ) 2 %, Apply topically daily. qd to open sores on arms, chest, Disp: 22 g, Rfl: 11   omeprazole  (PRILOSEC) 40 MG capsule, Take 1 capsule (40 mg total) by mouth every morning., Disp: 90 capsule, Rfl: 0   oxyCODONE -acetaminophen  (PERCOCET) 10-325 MG tablet, Take 1 tablet by mouth every 6 (six) hours as needed for pain., Disp: 120 tablet, Rfl: 0   rosuvastatin  (CRESTOR ) 10 MG tablet, Take 1 tablet (10 mg total) by mouth daily., Disp: 90 tablet, Rfl: 3   sacubitril-valsartan (ENTRESTO ) 24-26 MG, Take 1 tablet by mouth  twice daily, Disp: 180 tablet, Rfl: 1   sertraline  (ZOLOFT ) 50 MG tablet, TAKE 1 TABLET BY MOUTH EVERY DAY, Disp: 90 tablet, Rfl: 3   Thiamine HCl (VITAMIN B-1) 250 MG tablet, Take 250 mg by mouth daily., Disp: , Rfl:    VENTOLIN  HFA 108 (90 Base) MCG/ACT inhaler, INHALE 2 PUFFS BY MOUTH EVERY 4 HOURS AS NEEDED FOR WHEEZING OR SHORTNESS OF BREATH, Disp: 108 each, Rfl: 11   nitroGLYCERIN  (NITROSTAT ) 0.4 MG SL tablet, Place 1 tablet (0.4 mg total) under the tongue every 5 (five) minutes as needed., Disp: 30 tablet, Rfl: 0  Consent:   N/A  Disposition:   6 month follow up  Patient may be asked to follow-up sooner based on the results of the above-mentioned testing.  Her questions and concerns were addressed to her satisfaction. She voices understanding of  the recommendations provided during this encounter.    Signed, Awilda Bogus, [facility]  A Division of [facility] [address], [place], [place] [address]  [place], [place] [address] 11/26/2023 1:27 PM

## 2023-11-26 ENCOUNTER — Ambulatory Visit: Payer: 59 | Attending: Cardiology | Admitting: Cardiology

## 2023-11-26 ENCOUNTER — Encounter: Payer: Self-pay | Admitting: Cardiology

## 2023-11-26 VITALS — BP 120/60 | HR 76 | Resp 16 | Ht 62.0 in | Wt 127.0 lb

## 2023-11-26 DIAGNOSIS — I428 Other cardiomyopathies: Secondary | ICD-10-CM | POA: Diagnosis not present

## 2023-11-26 DIAGNOSIS — I700 Atherosclerosis of aorta: Secondary | ICD-10-CM | POA: Diagnosis not present

## 2023-11-26 DIAGNOSIS — E119 Type 2 diabetes mellitus without complications: Secondary | ICD-10-CM

## 2023-11-26 DIAGNOSIS — Z87891 Personal history of nicotine dependence: Secondary | ICD-10-CM

## 2023-11-26 DIAGNOSIS — I5022 Chronic systolic (congestive) heart failure: Secondary | ICD-10-CM | POA: Diagnosis not present

## 2023-11-26 DIAGNOSIS — E1169 Type 2 diabetes mellitus with other specified complication: Secondary | ICD-10-CM

## 2023-11-26 DIAGNOSIS — R072 Precordial pain: Secondary | ICD-10-CM

## 2023-11-26 DIAGNOSIS — I2510 Atherosclerotic heart disease of native coronary artery without angina pectoris: Secondary | ICD-10-CM | POA: Diagnosis not present

## 2023-11-26 DIAGNOSIS — E785 Hyperlipidemia, unspecified: Secondary | ICD-10-CM | POA: Diagnosis not present

## 2023-11-26 DIAGNOSIS — I10 Essential (primary) hypertension: Secondary | ICD-10-CM

## 2023-11-26 MED ORDER — NITROGLYCERIN 0.4 MG SL SUBL: 0.4000 mg | SUBLINGUAL_TABLET | SUBLINGUAL | 0 refills | Status: AC | PRN

## 2023-11-26 NOTE — Patient Instructions (Addendum)
 Medication Instructions:   Refill for nitroglycerin  placed and sent to your pharmacy.  *If you need a refill on your cardiac medications before your next appointment, please call your pharmacy*  Lab Work: None ordered today. If you have labs (blood work) drawn today and your tests are completely normal, you will receive your results only by: MyChart Message (if you have MyChart) OR A paper copy in the mail If you have any lab test that is abnormal or we need to change your treatment, we will call you to review the results.  Testing/Procedures: Your physician has requested that you have an echocardiogram. Echocardiography is a painless test that uses sound waves to create images of your heart. It provides your doctor with information about the size and shape of your heart and how well your heart's chambers and valves are working. This procedure takes approximately one hour. There are no restrictions for this procedure. Please do NOT wear cologne, perfume, aftershave, or lotions (deodorant is allowed). Please arrive 15 minutes prior to your appointment time.  Please note: We ask at that you not bring children with you during ultrasound (echo/ vascular) testing. Due to room size and safety concerns, children are not allowed in the ultrasound rooms during exams. Our front office staff cannot provide observation of children in our lobby area while testing is being conducted. An adult accompanying a patient to their appointment will only be allowed in the ultrasound room at the discretion of the ultrasound technician under special circumstances. We apologize for any inconvenience.   Follow-Up: At [facility], you and your health needs are our priority.  As part of our continuing mission to provide you with exceptional heart care, our providers are all part of one team.  This team includes your primary Cardiologist (physician) and Advanced Practice Providers or APPs (Physician Assistants and  Nurse Practitioners) who all work together to provide you with the care you need, when you need it.  Your next appointment:   6 months   Provider:   Olinda Bertrand, DO    We recommend signing up for the patient portal called "MyChart".  Sign up information is provided on this After Visit Summary.  MyChart is used to connect with patients for Virtual Visits (Telemedicine).  Patients are able to view lab/test results, encounter notes, upcoming appointments, etc.  Non-urgent messages can be sent to your provider as well.   To learn more about what you can do with MyChart, go to [email or web address].

## 2023-12-01 ENCOUNTER — Ambulatory Visit (INDEPENDENT_AMBULATORY_CARE_PROVIDER_SITE_OTHER): Admitting: Family Medicine

## 2023-12-01 ENCOUNTER — Encounter: Payer: Self-pay | Admitting: Family Medicine

## 2023-12-01 VITALS — BP 97/62 | HR 85 | Ht 62.0 in | Wt 126.8 lb

## 2023-12-01 DIAGNOSIS — E1142 Type 2 diabetes mellitus with diabetic polyneuropathy: Secondary | ICD-10-CM

## 2023-12-01 DIAGNOSIS — Z5181 Encounter for therapeutic drug level monitoring: Secondary | ICD-10-CM | POA: Diagnosis not present

## 2023-12-01 DIAGNOSIS — G8929 Other chronic pain: Secondary | ICD-10-CM | POA: Diagnosis not present

## 2023-12-01 DIAGNOSIS — Z7969 Long term (current) use of other immunomodulators and immunosuppressants: Secondary | ICD-10-CM

## 2023-12-01 DIAGNOSIS — M059 Rheumatoid arthritis with rheumatoid factor, unspecified: Secondary | ICD-10-CM | POA: Diagnosis not present

## 2023-12-01 LAB — POCT GLYCOSYLATED HEMOGLOBIN (HGB A1C): HbA1c, POC (controlled diabetic range): 5.3 % (ref 0.0–7.0)

## 2023-12-01 MED ORDER — OXYCODONE-ACETAMINOPHEN 10-325 MG PO TABS
1.0000 | ORAL_TABLET | Freq: Four times a day (QID) | ORAL | 0 refills | Status: DC | PRN
Start: 2023-12-01 — End: 2024-01-05

## 2023-12-01 MED ORDER — METFORMIN HCL ER 500 MG PO TB24: 1000.0000 mg | ORAL_TABLET | Freq: Every day | ORAL | Status: DC

## 2023-12-01 NOTE — Progress Notes (Signed)
    SUBJECTIVE:   CHIEF COMPLAINT / HPI:   T2DM -Current medication regimen: Trulicity  3 mg weekly, metformin  1000 mg twice daily.  She is also on Crestor  10 mg daily and gabapentin  300 mg nightly -Denies polyuria, polydipsia, abdominal pain, chest pain, shortness of breath -Foot exam: UTD -Eye exam: UTD  Lab Results  Component Value Date   HGBA1C 5.3 12/01/2023   HGBA1C 6.3 09/02/2023   HGBA1C 8.2 (A) 06/02/2023   Seen by me about 3 months ago at which time her A1c had improved to 6.3 from 8.2.  Chronic pain on percocet Needs refill  -rheumatoid arthritis -Had knee surgery in September for torn meniscus -severe pain overall well controlled w percocet -Storing in a safe area -Last dose this morning -Denies cravings or lack of control with medication dosing -Denies side effects -Pain contract signed in 2016  Her rheumatologist is requesting bloodwork for leflunomide  monitoring - CBC and CMP. Has appt tomorrow, fax results to [phone]     PERTINENT  PMH / PSH: T2DM, RA  OBJECTIVE:   BP 97/62   Pulse 85   Ht 5\' 2"  (1.575 m)   Wt 126 lb 12.8 oz (57.5 kg)   SpO2 95%   BMI 23.19 kg/m   General: NAD, pleasant, able to participate in exam Respiratory: No respiratory distress Skin: warm and dry, no rashes noted Psych: Normal affect and mood  ASSESSMENT/PLAN:   Assessment & Plan Type 2 diabetes mellitus with diabetic polyneuropathy, without long-term current use of insulin  (HCC) A1c greatly improved today 5.3 I think we can ease off her regimen - decrease metformin  to 1000mg  daily F/u 3 mo Encounter for chronic pain management For RA - stable; Refilled percocet; discussed safety; has scheduled f/u with rheum UDS next visit F/u 3 mo Encounter for monitoring leflunomide  therapy CBC, CMP per rheum request Fax results to [phone]   Edison Gore, MD [facility]

## 2023-12-01 NOTE — Assessment & Plan Note (Addendum)
 For RA - stable; Refilled percocet; discussed safety; has scheduled f/u with rheum UDS next visit F/u 3 mo

## 2023-12-01 NOTE — Patient Instructions (Signed)
 I will let you know if any results are abnormal today  Please take metformin  1000mg  daily instead of twice daily

## 2023-12-01 NOTE — Assessment & Plan Note (Addendum)
 A1c greatly improved today 5.3 I think we can ease off her regimen - decrease metformin  to 1000mg  daily F/u 3 mo

## 2023-12-02 ENCOUNTER — Ambulatory Visit: Payer: Self-pay | Admitting: Family Medicine

## 2023-12-02 ENCOUNTER — Telehealth: Payer: Self-pay

## 2023-12-02 DIAGNOSIS — M1711 Unilateral primary osteoarthritis, right knee: Secondary | ICD-10-CM | POA: Diagnosis not present

## 2023-12-02 LAB — COMPREHENSIVE METABOLIC PANEL WITH GFR
ALT: 11 IU/L (ref 0–32)
AST: 22 IU/L (ref 0–40)
Albumin: 4.3 g/dL (ref 3.8–4.8)
Alkaline Phosphatase: 75 IU/L (ref 44–121)
BUN/Creatinine Ratio: 24 (ref 12–28)
BUN: 16 mg/dL (ref 8–27)
Bilirubin Total: 0.4 mg/dL (ref 0.0–1.2)
CO2: 19 mmol/L — ABNORMAL LOW (ref 20–29)
Calcium: 9 mg/dL (ref 8.7–10.3)
Chloride: 104 mmol/L (ref 96–106)
Creatinine, Ser: 0.68 mg/dL (ref 0.57–1.00)
Globulin, Total: 1.8 g/dL (ref 1.5–4.5)
Glucose: 113 mg/dL — ABNORMAL HIGH (ref 70–99)
Potassium: 4.2 mmol/L (ref 3.5–5.2)
Sodium: 140 mmol/L (ref 134–144)
Total Protein: 6.1 g/dL (ref 6.0–8.5)
eGFR: 92 mL/min/{1.73_m2} (ref >59)

## 2023-12-02 LAB — CBC WITH DIFFERENTIAL/PLATELET
Basophils Absolute: 0.1 10*3/uL (ref 0.0–0.2)
Basos: 1 %
EOS (ABSOLUTE): 0.4 10*3/uL (ref 0.0–0.4)
Eos: 4 %
Hematocrit: 35.5 % (ref 34.0–46.6)
Hemoglobin: 11.3 g/dL (ref 11.1–15.9)
Immature Grans (Abs): 0 10*3/uL (ref 0.0–0.1)
Immature Granulocytes: 0 %
Lymphocytes Absolute: 2.1 10*3/uL (ref 0.7–3.1)
Lymphs: 25 %
MCH: 32.8 pg (ref 26.6–33.0)
MCHC: 31.8 g/dL (ref 31.5–35.7)
MCV: 103 fL — ABNORMAL HIGH (ref 79–97)
Monocytes Absolute: 0.7 10*3/uL (ref 0.1–0.9)
Monocytes: 8 %
Neutrophils Absolute: 5.4 10*3/uL (ref 1.4–7.0)
Neutrophils: 62 %
Platelets: 276 10*3/uL (ref 150–450)
RBC: 3.44 x10E6/uL — ABNORMAL LOW (ref 3.77–5.28)
RDW: 11.7 % (ref 11.7–15.4)
WBC: 8.7 10*3/uL (ref 3.4–10.8)

## 2023-12-02 NOTE — Telephone Encounter (Signed)
 Michelle [name] the patient lab work results to fax over.

## 2023-12-03 DIAGNOSIS — M47816 Spondylosis without myelopathy or radiculopathy, lumbar region: Secondary | ICD-10-CM | POA: Diagnosis not present

## 2023-12-10 ENCOUNTER — Other Ambulatory Visit: Payer: Self-pay | Admitting: *Deleted

## 2023-12-10 ENCOUNTER — Inpatient Hospital Stay: Payer: 59 | Attending: Hematology and Oncology

## 2023-12-10 DIAGNOSIS — D500 Iron deficiency anemia secondary to blood loss (chronic): Secondary | ICD-10-CM

## 2023-12-10 DIAGNOSIS — D509 Iron deficiency anemia, unspecified: Secondary | ICD-10-CM | POA: Insufficient documentation

## 2023-12-10 LAB — CMP (CANCER CENTER ONLY)
ALT: 15 U/L (ref 0–44)
AST: 17 U/L (ref 15–41)
Albumin: 4.1 g/dL (ref 3.5–5.0)
Alkaline Phosphatase: 65 U/L (ref 38–126)
Anion gap: 6 (ref 5–15)
BUN: 14 mg/dL (ref 8–23)
CO2: 26 mmol/L (ref 22–32)
Calcium: 9.1 mg/dL (ref 8.9–10.3)
Chloride: 108 mmol/L (ref 98–111)
Creatinine: 0.53 mg/dL (ref 0.44–1.00)
GFR, Estimated: >60 mL/min (ref >60)
Glucose, Bld: 127 mg/dL — ABNORMAL HIGH (ref 70–99)
Potassium: 4.6 mmol/L (ref 3.5–5.1)
Sodium: 140 mmol/L (ref 135–145)
Total Bilirubin: 0.6 mg/dL (ref 0.0–1.2)
Total Protein: 6.5 g/dL (ref 6.5–8.1)

## 2023-12-10 LAB — CBC WITH DIFFERENTIAL (CANCER CENTER ONLY)
Abs Immature Granulocytes: 0.02 10*3/uL (ref 0.00–0.07)
Basophils Absolute: 0 10*3/uL (ref 0.0–0.1)
Basophils Relative: 1 %
Eosinophils Absolute: 0.2 10*3/uL (ref 0.0–0.5)
Eosinophils Relative: 2 %
HCT: 34 % — ABNORMAL LOW (ref 36.0–46.0)
Hemoglobin: 11.2 g/dL — ABNORMAL LOW (ref 12.0–15.0)
Immature Granulocytes: 0 %
Lymphocytes Relative: 26 %
Lymphs Abs: 2.2 10*3/uL (ref 0.7–4.0)
MCH: 32.7 pg (ref 26.0–34.0)
MCHC: 32.9 g/dL (ref 30.0–36.0)
MCV: 99.4 fL (ref 80.0–100.0)
Monocytes Absolute: 0.7 10*3/uL (ref 0.1–1.0)
Monocytes Relative: 9 %
Neutro Abs: 5.1 10*3/uL (ref 1.7–7.7)
Neutrophils Relative %: 62 %
Platelet Count: 263 10*3/uL (ref 150–400)
RBC: 3.42 MIL/uL — ABNORMAL LOW (ref 3.87–5.11)
RDW: 12.7 % (ref 11.5–15.5)
WBC Count: 8.2 10*3/uL (ref 4.0–10.5)
nRBC: 0 % (ref 0.0–0.2)

## 2023-12-10 LAB — RETIC PANEL
Immature Retic Fract: 13.3 % (ref 2.3–15.9)
RBC.: 3.41 MIL/uL — ABNORMAL LOW (ref 3.87–5.11)
Retic Count, Absolute: 96.2 10*3/uL (ref 19.0–186.0)
Retic Ct Pct: 2.8 % (ref 0.4–3.1)
Reticulocyte Hemoglobin: 37.2 pg (ref >27.9)

## 2023-12-10 LAB — IRON AND IRON BINDING CAPACITY (CC-WL,HP ONLY)
Iron: 123 ug/dL (ref 28–170)
Saturation Ratios: 33 % — ABNORMAL HIGH (ref 10.4–31.8)
TIBC: 374 ug/dL (ref 250–450)
UIBC: 251 ug/dL (ref 148–442)

## 2023-12-10 LAB — FERRITIN: Ferritin: 268 ng/mL (ref 11–307)

## 2023-12-12 DIAGNOSIS — M1711 Unilateral primary osteoarthritis, right knee: Secondary | ICD-10-CM | POA: Diagnosis not present

## 2023-12-15 DIAGNOSIS — R0989 Other specified symptoms and signs involving the circulatory and respiratory systems: Secondary | ICD-10-CM | POA: Diagnosis not present

## 2023-12-15 DIAGNOSIS — K219 Gastro-esophageal reflux disease without esophagitis: Secondary | ICD-10-CM | POA: Diagnosis not present

## 2023-12-15 DIAGNOSIS — M069 Rheumatoid arthritis, unspecified: Secondary | ICD-10-CM | POA: Diagnosis not present

## 2023-12-15 DIAGNOSIS — I5022 Chronic systolic (congestive) heart failure: Secondary | ICD-10-CM | POA: Diagnosis not present

## 2023-12-15 DIAGNOSIS — J45991 Cough variant asthma: Secondary | ICD-10-CM | POA: Diagnosis not present

## 2023-12-15 DIAGNOSIS — Z23 Encounter for immunization: Secondary | ICD-10-CM | POA: Diagnosis not present

## 2023-12-15 DIAGNOSIS — J45909 Unspecified asthma, uncomplicated: Secondary | ICD-10-CM | POA: Diagnosis not present

## 2023-12-15 DIAGNOSIS — Z87891 Personal history of nicotine dependence: Secondary | ICD-10-CM | POA: Diagnosis not present

## 2023-12-15 DIAGNOSIS — J309 Allergic rhinitis, unspecified: Secondary | ICD-10-CM | POA: Diagnosis not present

## 2023-12-16 ENCOUNTER — Encounter: Payer: Self-pay | Admitting: Podiatry

## 2023-12-16 ENCOUNTER — Ambulatory Visit (INDEPENDENT_AMBULATORY_CARE_PROVIDER_SITE_OTHER): Payer: 59 | Admitting: Podiatry

## 2023-12-16 VITALS — Ht 62.0 in | Wt 126.8 lb

## 2023-12-16 DIAGNOSIS — M79675 Pain in left toe(s): Secondary | ICD-10-CM

## 2023-12-16 DIAGNOSIS — M79674 Pain in right toe(s): Secondary | ICD-10-CM

## 2023-12-16 DIAGNOSIS — E1151 Type 2 diabetes mellitus with diabetic peripheral angiopathy without gangrene: Secondary | ICD-10-CM | POA: Diagnosis not present

## 2023-12-16 DIAGNOSIS — T148XXA Other injury of unspecified body region, initial encounter: Secondary | ICD-10-CM

## 2023-12-16 DIAGNOSIS — B351 Tinea unguium: Secondary | ICD-10-CM

## 2023-12-16 DIAGNOSIS — M1711 Unilateral primary osteoarthritis, right knee: Secondary | ICD-10-CM | POA: Diagnosis not present

## 2023-12-16 DIAGNOSIS — G629 Polyneuropathy, unspecified: Secondary | ICD-10-CM | POA: Diagnosis not present

## 2023-12-16 NOTE — Progress Notes (Unsigned)
 Subjective:  Patient ID: Michelle [name], female    DOB: 08-31-1950,  MRN: [id number]  Michelle [name] presents to clinic today for: at risk footcare. Patient has h/o diabetes, neuropathy and PAD and is seen for  and painful elongated mycotic toenails 1-5 bilaterally which are tender when wearing enclosed shoe gear. Pain is relieved with periodic professional debridement.  Chief Complaint  Patient presents with   Nail Problem    Pt is here for [facility] last A1C was 5.3 PCP is Dr Baby Bolt and LOV was early June.    PCP is Edison Gore, MD.  Allergies  Allergen Reactions   Zofran  [Ondansetron  Hcl] Other (See Comments)    Prolong QT   Ace Inhibitors Cough    Occurred with lisinopril   Levofloxacin  Other (See Comments)    Felt things on her legs that were not there,made her stomach hurt   Lisinopril Cough   Sulfonamide Derivatives Hives and Itching   Sulfa Antibiotics Hives and Itching    Review of Systems: Negative except as noted in the HPI.  Objective: No changes noted in today's physical examination. There were no vitals filed for this visit.  Michelle [name] is a pleasant 73 y.o. female WD, WN in NAD. AAO x 3.  Vascular Examination: CFT <3 seconds b/l. Pedal pulses faintly palpable RLE and nonpalpable LLE. Skin temperature gradient warm to warm b/l. No pain with calf compression. No ischemia or gangrene. No cyanosis or clubbing noted b/l.    Neurological Examination: Sensation grossly intact b/l with 10 gram monofilament. Vibratory sensation intact b/l. Pt has subjective symptoms of neuropathy.  Dermatological Examination: Pedal skin warm and supple b/l. No open wounds b/l. No interdigital macerations. Toenails 1-5 b/l thick, discolored, elongated with subungual debris and pain on dorsal palpation.  No corns, calluses nor porokeratotic lesions noted.  Musculoskeletal Examination: Muscle strength 5/5 to all lower extremity muscle groups bilaterally. HAV with bunion deformity  noted b/l LE. Patient ambulates independent of any assistive aids.  Radiographs: None    Latest Ref Rng & Units 12/01/2023    1:15 PM 09/02/2023    8:14 AM 06/02/2023    1:35 PM 03/04/2023    8:34 AM  Hemoglobin A1C  Hemoglobin-A1c 0.0 - 7.0 % 5.3  6.3  8.2  7.6    Assessment/Plan: 1. Pain due to onychomycosis of toenails of both feet   2. Neuropathy   3. Diabetes mellitus with peripheral circulatory disorder (HCC)     No orders of the defined types were placed in this encounter.  None Patient was evaluated and treated. All patient's and/or POA's questions/concerns addressed on today's visit. Toenails 1-5 debrided in length and girth without incident. Continue foot and shoe inspections daily. Monitor blood glucose per PCP/Endocrinologist's recommendations. Continue soft, supportive shoe gear daily. Report any pedal injuries to medical professional. Call office if there are any questions/concerns. -Patient/POA to call should there be question/concern in the interim.   Return in about 3 months (around 03/17/2024).  Michelle [name], DPM      [place] LOCATION: [address] [place] [address]                   Office [phone])  [phone]   [facility] LOCATION: [address] [place], [place] [address] Office [phone]

## 2023-12-25 DIAGNOSIS — H5213 Myopia, bilateral: Secondary | ICD-10-CM | POA: Diagnosis not present

## 2023-12-31 ENCOUNTER — Ambulatory Visit (HOSPITAL_COMMUNITY)
Admission: RE | Admit: 2023-12-31 | Discharge: 2023-12-31 | Disposition: A | Discharge status: Home / Self Care | Source: Ambulatory Visit | Attending: Cardiology | Admitting: Cardiology

## 2023-12-31 DIAGNOSIS — R072 Precordial pain: Secondary | ICD-10-CM

## 2023-12-31 LAB — ECHOCARDIOGRAM COMPLETE
Area-P 1/2: 3.13 cm2
S' Lateral: 3 cm

## 2024-01-01 ENCOUNTER — Ambulatory Visit: Payer: Self-pay | Admitting: Cardiology

## 2024-01-03 ENCOUNTER — Other Ambulatory Visit: Payer: Self-pay | Admitting: Family Medicine

## 2024-01-03 DIAGNOSIS — E1142 Type 2 diabetes mellitus with diabetic polyneuropathy: Secondary | ICD-10-CM

## 2024-01-05 ENCOUNTER — Other Ambulatory Visit: Payer: Self-pay

## 2024-01-05 DIAGNOSIS — G8929 Other chronic pain: Secondary | ICD-10-CM

## 2024-01-05 DIAGNOSIS — M059 Rheumatoid arthritis with rheumatoid factor, unspecified: Secondary | ICD-10-CM

## 2024-01-05 DIAGNOSIS — E1142 Type 2 diabetes mellitus with diabetic polyneuropathy: Secondary | ICD-10-CM

## 2024-01-05 MED ORDER — TRULICITY 3 MG/0.5ML ~~LOC~~ SOAJ: 3.0000 mg | SUBCUTANEOUS | 1 refills | Status: DC

## 2024-01-05 MED ORDER — OXYCODONE-ACETAMINOPHEN 10-325 MG PO TABS: 1.0000 | ORAL_TABLET | Freq: Four times a day (QID) | ORAL | 0 refills | Status: DC | PRN

## 2024-01-09 ENCOUNTER — Telehealth: Payer: Self-pay

## 2024-01-09 DIAGNOSIS — E1142 Type 2 diabetes mellitus with diabetic polyneuropathy: Secondary | ICD-10-CM

## 2024-01-09 MED ORDER — ACCU-CHEK GUIDE TEST VI STRP: ORAL_STRIP | 12 refills | Status: AC

## 2024-01-09 MED ORDER — ACCU-CHEK SOFTCLIX LANCETS MISC: 4 refills | Status: AC

## 2024-01-09 MED ORDER — ACCU-CHEK SOFTCLIX LANCET DEV KIT: PACK | 0 refills | Status: AC

## 2024-01-09 MED ORDER — ACCU-CHEK GUIDE ME W/DEVICE KIT: PACK | 0 refills | Status: DC

## 2024-01-09 NOTE — Telephone Encounter (Signed)
 Patient calls nurse line regarding diabetic supplies.  She reports that she received letter from [employer] stating that One touch meter is not covered.   Reports that Accu-chek guide me meter will now be covered by her insurance.   Patient reports testing blood sugar levels x3 daily.   Pended meter and supplies to this encounter.   Chiquita JAYSON English, RN

## 2024-01-22 DIAGNOSIS — M47816 Spondylosis without myelopathy or radiculopathy, lumbar region: Secondary | ICD-10-CM | POA: Diagnosis not present

## 2024-01-23 ENCOUNTER — Other Ambulatory Visit: Payer: Self-pay | Admitting: Cardiology

## 2024-01-23 DIAGNOSIS — I5022 Chronic systolic (congestive) heart failure: Secondary | ICD-10-CM

## 2024-01-30 ENCOUNTER — Other Ambulatory Visit: Payer: Self-pay

## 2024-01-30 DIAGNOSIS — G8929 Other chronic pain: Secondary | ICD-10-CM

## 2024-01-30 DIAGNOSIS — M059 Rheumatoid arthritis with rheumatoid factor, unspecified: Secondary | ICD-10-CM

## 2024-01-30 MED ORDER — OXYCODONE-ACETAMINOPHEN 10-325 MG PO TABS: 1.0000 | ORAL_TABLET | Freq: Four times a day (QID) | ORAL | 0 refills | Status: DC | PRN

## 2024-02-05 DIAGNOSIS — M79675 Pain in left toe(s): Secondary | ICD-10-CM | POA: Diagnosis not present

## 2024-02-05 DIAGNOSIS — Z7969 Long term (current) use of other immunomodulators and immunosuppressants: Secondary | ICD-10-CM | POA: Diagnosis not present

## 2024-02-05 DIAGNOSIS — R0989 Other specified symptoms and signs involving the circulatory and respiratory systems: Secondary | ICD-10-CM | POA: Diagnosis not present

## 2024-02-05 DIAGNOSIS — M059 Rheumatoid arthritis with rheumatoid factor, unspecified: Secondary | ICD-10-CM | POA: Diagnosis not present

## 2024-02-05 DIAGNOSIS — R200 Anesthesia of skin: Secondary | ICD-10-CM | POA: Diagnosis not present

## 2024-02-05 DIAGNOSIS — Z5181 Encounter for therapeutic drug level monitoring: Secondary | ICD-10-CM | POA: Diagnosis not present

## 2024-02-15 ENCOUNTER — Ambulatory Visit: Admission: EM | Admit: 2024-02-15 | Discharge: 2024-02-15 | Disposition: A | Discharge status: Home / Self Care

## 2024-02-15 ENCOUNTER — Ambulatory Visit (INDEPENDENT_AMBULATORY_CARE_PROVIDER_SITE_OTHER)

## 2024-02-15 ENCOUNTER — Encounter: Payer: Self-pay | Admitting: Emergency Medicine

## 2024-02-15 DIAGNOSIS — J4531 Mild persistent asthma with (acute) exacerbation: Secondary | ICD-10-CM | POA: Diagnosis not present

## 2024-02-15 DIAGNOSIS — R051 Acute cough: Secondary | ICD-10-CM | POA: Diagnosis not present

## 2024-02-15 DIAGNOSIS — J206 Acute bronchitis due to rhinovirus: Secondary | ICD-10-CM | POA: Diagnosis not present

## 2024-02-15 DIAGNOSIS — T148XXA Other injury of unspecified body region, initial encounter: Secondary | ICD-10-CM

## 2024-02-15 DIAGNOSIS — R059 Cough, unspecified: Secondary | ICD-10-CM | POA: Diagnosis not present

## 2024-02-15 LAB — POC SOFIA SARS ANTIGEN FIA: SARS Coronavirus 2 Ag: NEGATIVE

## 2024-02-15 MED ORDER — IPRATROPIUM-ALBUTEROL 0.5-2.5 (3) MG/3ML IN SOLN
3.0000 mL | Freq: Once | RESPIRATORY_TRACT | Status: AC
  Administered 2024-02-15: 3 mL via RESPIRATORY_TRACT

## 2024-02-15 MED ORDER — AZITHROMYCIN 500 MG PO TABS: 500.0000 mg | ORAL_TABLET | Freq: Every day | ORAL | 0 refills | Status: AC

## 2024-02-15 MED ORDER — METHYLPREDNISOLONE 4 MG PO TBPK: ORAL_TABLET | ORAL | 0 refills | Status: DC

## 2024-02-15 MED ORDER — BENZONATATE 200 MG PO CAPS: 200.0000 mg | ORAL_CAPSULE | Freq: Three times a day (TID) | ORAL | 0 refills | Status: AC | PRN

## 2024-02-15 MED ORDER — MUCINEX DM MAXIMUM STRENGTH 60-1200 MG PO TB12: 1.0000 | ORAL_TABLET | Freq: Two times a day (BID) | ORAL | 0 refills | Status: AC

## 2024-02-15 NOTE — ED Notes (Signed)
 Verbal order received from Samantha Murrill, FNP for CXR

## 2024-02-15 NOTE — ED Notes (Signed)
 Breathing/Neb Treatment completed.

## 2024-02-15 NOTE — ED Triage Notes (Addendum)
 Pt c/o non productive cough x's 4 days  St's discomfort in chest when she coughs   Also c/o headache

## 2024-02-15 NOTE — ED Provider Notes (Signed)
 [facility]    CSN: [id number] Arrival date & time: 02/15/24  0804      History   Chief Complaint Chief Complaint  Patient presents with   Cough    HPI Michelle [name] is a 73 y.o. female.   Discussed the use of AI scribe software for clinical note transcription with the patient, who gave verbal consent to proceed.   Patient with a history of asthma, heart failure, and rheumatoid arthritis presents with cough, chest pain, wheezing, shortness of breath, chills, sore throat, and headache since Thursday. The cough has persisted. The cough is non-productive, but causes chest pain when coughing. The patient denies chest pain when not coughing. Associated symptoms include wheezing and shortness of breath, which the patient attributes to her asthma. The patient also experiences chills and a headache. She reports a sore throat, which she describes as a little aggravated and attribute to the coughing. Her voice is noted to be hoarse. The patient denies fever, body aches, runny nose, sneezing, nasal congestion, ear pain, nausea, vomiting, and diarrhea. She reports having had COVID-19 a few years ago. The patient has used her inhaler for her symptoms but cannot tell much of a difference in effectiveness. She hasn't tried anything else for her symptoms. She denies smoking or vaping.  Patient additionally reports multiple areas of bruising and soreness to her arms and chest, which she attributes to her new 93-month-old dog weighing nearly 100 pounds. She denies being bitten and clarifies that the bruising is from the dog's playful behavior. She is not on anticoagulant therapy. Her Tdap vaccination is up to date, with the last dose received on 12/15/2023.  The following portions of the patient's history were reviewed and updated as appropriate: allergies, current medications, past family history, past medical history, past social history, past surgical history, and problem  list.     Past Medical History:  Diagnosis Date   Actinic keratosis    Allergic rhinitis 08/28/2006   Anemia    low iron    Anginal pain (HCC)    Asthma, chronic 03/14/2010   Does seem to use her albuterol  excessively. Consider change to more manageable medicines and treat more like COPD in the future.   Carpal tunnel syndrome, left 05/29/2009   Last Assessment & Plan:  Advised to wear wrist brace at night when she is sleeping.  If this does not help will consider having her go to sports medicine.   CHF (congestive heart failure) (HCC) 07/2022   HFrEF, NYHA Class I   Chronic frontal sinusitis 08/28/2016   Chronic left maxillary sinusitis 08/28/2016   Diverticulosis    Essential hypertension 10/30/2012   Gastroparesis 11/19/2011   DM and chronic narcotics contribute.  Causes dysphagia like symptoms    Generalized anxiety disorder 08/28/2006   GERD (gastroesophageal reflux disease)    Hearing loss 07/19/2015   Hearing aid in right ear   History of cataracts, bilateral    Hyperlipidemia 08/28/2006   Lumbar radiculopathy 04/07/2018   Lung nodules    Nonischemic cardiomyopathy (HCC)    Osteoporosis 08/08/2011   Pain in joint, pelvic region and thigh 11/18/2012   Radial styloid tenosynovitis 08/15/2013   Restless leg syndrome 05/14/2012   Seropositive rheumatoid arthritis 01/08/2010   Patient is being followed by rheumatologist at [facility]  patient is on Plaquenil and as well as prednisone .   Changed to methotrexate and prednisone  in May of 2013  Patient does have [facility] receives oxycodone  10/325 mg tabs every 6 hours. This has been titrated down from significant doses of methadone previously. Could attempt to tit   Shoulder joint pain 03/25/2011   The patient is compensating due to her not being able to use her left arm right now rotator cuff impingement if he continues to worsen would not be surprised if she does get  a tear on this side as well   Squamous cell carcinoma in situ of skin 11/25/2014   Referred to dermatology for excision    Squamous cell carcinoma of skin 12/26/2014   Right distal pretibial. SCC-KA pattern.   Squamous cell carcinoma of skin 03/27/2015   Right inf. lat. knee. SCC-KA pattern.    Squamous cell carcinoma of skin 10/07/2016   Left lateral calf superior. WD SCC.   Squamous cell carcinoma of skin 10/07/2016   Left lateral calf inferior. WD SCC.   Squamous cell carcinoma of skin 04/28/2018   Left mid med. pretibial. WD SCC.   Squamous cell carcinoma of skin 08/12/2019   Left mid med. pretibial sup. KA type. EDC   Squamous cell carcinoma of skin 08/12/2019   Left mid med. pretibial inferior. WD. EDC.   Tear of medial meniscus of knee, left 04/27/2009   Type II diabetes mellitus 08/28/2006   Overview:  Patient has her ups and downs, go in line with flares of her RA and prednisone  uses.  Patient does have hx of hypoglycemic events so would not try for perfect control but should have A1c goal around 7.0 Lab Results  Component Value Date   HGBA1C 6.9 12/27/2011   Last Assessment & Plan:  At goal. Will continue current metformin     Patient Active Problem List   Diagnosis Date Noted   Chronic HFrEF (heart failure with reduced ejection fraction) (HCC) 11/22/2022   HFrEF (heart failure with reduced ejection fraction) (HCC) 07/19/2022   Murmur, cardiac 07/16/2022   Ganglion cyst of dorsum of left wrist 06/21/2022   Cicatricial entropion of both upper eyelids 10/10/2021   Exposure keratopathy, bilateral 10/10/2021   Ptosis of both eyelids 10/10/2021   Senile ectropion of both lower eyelids 10/10/2021   Bilateral sacroiliitis (HCC) 05/18/2021   Status post lumbar spinal fusion 09/21/2020   Bowel and bladder incontinence 03/05/2020   Balance problem 12/11/2018   Onychomycosis 12/02/2018   Lumbar radiculopathy 04/07/2018   Urge incontinence of urine 04/18/2017   Iron  deficiency  anemia due to chronic blood loss 12/27/2016   Chronic frontal sinusitis 08/28/2016   Chronic left maxillary sinusitis 08/28/2016   Hearing loss 07/19/2015   Squamous cell carcinoma in situ of skin 11/25/2014   Encounter for chronic pain management 08/05/2014   Memory difficulties 07/05/2014   Pulmonary nodules 03/02/2014   Weight loss 03/02/2014   Hypertension associated with diabetes (HCC) 10/30/2012   Irritable bowel 07/13/2012   Restless leg syndrome 05/14/2012   Gastroparesis 11/19/2011   Dysphagia 09/10/2011   Osteoporosis 08/08/2011   Osteopenia 08/08/2011   Restrictive lung disease 08/09/2010   Asthma, chronic 03/14/2010   Seropositive rheumatoid arthritis 01/08/2010   GERD (gastroesophageal reflux disease) 08/22/2008   Type II diabetes mellitus (HCC) 08/28/2006   Hyperlipidemia associated with type 2 diabetes mellitus (HCC) 08/28/2006   Generalized anxiety disorder 08/28/2006   Allergic rhinitis 08/28/2006    Past Surgical History:  Procedure Laterality Date   ABDOMINAL HYSTERECTOMY     APPENDECTOMY     BACK SURGERY     BIOPSY  05/10/2021  Procedure: BIOPSY;  Surgeon: Teressa Toribio SQUIBB, MD;  Location: [facility];  Service: Endoscopy;;  EGD and COLON   CARPAL TUNNEL RELEASE Right    CATARACT EXTRACTION Left    with lid lift   CHOLECYSTECTOMY     COLONOSCOPY     COLONOSCOPY WITH PROPOFOL  N/A 05/10/2021   Procedure: COLONOSCOPY WITH PROPOFOL ;  Surgeon: Teressa Toribio SQUIBB, MD;  Location: [facility];  Service: Endoscopy;  Laterality: N/A;   ESOPHAGOGASTRODUODENOSCOPY (EGD) WITH PROPOFOL  N/A 05/10/2021   Procedure: ESOPHAGOGASTRODUODENOSCOPY (EGD) WITH PROPOFOL ;  Surgeon: Teressa Toribio SQUIBB, MD;  Location: [facility];  Service: Endoscopy;  Laterality: N/A;   HERNIA REPAIR     KNEE ARTHROSCOPY WITH MEDIAL MENISECTOMY Right 03/26/2023   Procedure: KNEE ARTHROSCOPY WITH MEDIAL MENISECTOMY;  Surgeon: Cristy Bonner DASEN, MD;  Location: [facility];  Service: Orthopedics;  Laterality:  Right;   KNEE SURGERY Right    x2- cartilage annd scar tissue   LEFT AND RIGHT HEART CATHETERIZATION WITH CORONARY ANGIOGRAM N/A 08/17/2013   Procedure: LEFT AND RIGHT HEART CATHETERIZATION WITH CORONARY ANGIOGRAM;  Surgeon: Erick JONELLE Bergamo, MD;  Location: [facility] CATH LAB;  Service: Cardiovascular;  Laterality: N/A;   lumbar back surgery  04/04/2018   done by neurosurgeron   right foot bone spurs removed     RIGHT/LEFT HEART CATH AND CORONARY ANGIOGRAPHY N/A 07/30/2022   Procedure: RIGHT/LEFT HEART CATH AND CORONARY ANGIOGRAPHY;  Surgeon: Elmira Newman PARAS, MD;  Location: [facility];  Service: Cardiovascular;  Laterality: N/A;   ROTATOR CUFF REPAIR Right    SHOULDER SURGERY Left    x2- rotatar cuff tear   SYNOVECTOMY Right 03/26/2023   Procedure: SYNOVECTOMY;  Surgeon: Cristy Bonner DASEN, MD;  Location: [facility];  Service: Orthopedics;  Laterality: Right;   UPPER GASTROINTESTINAL ENDOSCOPY      OB History   No obstetric history on file.      Home Medications    Prior to Admission medications   Medication Sig Start Date End Date Taking? Authorizing Provider  azithromycin  (ZITHROMAX ) 500 MG tablet Take 1 tablet (500 mg total) by mouth daily for 5 days. 02/15/24 02/20/24 Yes Keelee Yankey, FNP  benzonatate  (TESSALON ) 200 MG capsule Take 1 capsule (200 mg total) by mouth 3 (three) times daily as needed for cough. 02/15/24  Yes Iola Lukes, FNP  betamethasone  dipropionate 0.05 % lotion Apply topically daily. 06/15/20  Yes [provider]  Dextromethorphan-guaiFENesin  (MUCINEX  DM MAXIMUM STRENGTH) 60-1200 MG TB12 Take 1 tablet by mouth 2 (two) times daily. 02/15/24  Yes Iola Lukes, FNP  methylPREDNISolone  (MEDROL  DOSEPAK) 4 MG TBPK tablet Take as directed 02/15/24  Yes Bijou Easler, Lukes, FNP  mupirocin  ointment (BACTROBAN ) 2 % Apply 1 Application topically. 01/24/22  Yes [provider]  nitrofurantoin (MACRODANTIN) 100 MG capsule Take 100 mg by mouth daily.  01/04/24  Yes [provider]  triamcinolone  cream (KENALOG ) 0.1 % Apply 1 Application topically 2 (two) times daily. 08/11/13  Yes [provider]  Accu-Chek Softclix Lancets lancets Please use to check blood sugar up to three times per day. E11.42 01/09/24   [name], MD  Blood Glucose Monitoring Suppl (ACCU-CHEK GUIDE ME) w/Device KIT Please use to check blood sugar up to three times per day. E11.42 01/09/24   [name], MD  BREO ELLIPTA  200-25 MCG/ACT AEPB Inhale 1 puff into the lungs daily.    [provider]  cetirizine  (ZYRTEC ) 10 MG tablet Take 10 mg by mouth daily.    [provider]  cholestyramine  (QUESTRAN ) 4 g packet Take 1 packet (4 g total) by mouth daily. 06/27/23   Christia Budds, MD  clobetasol  (TEMOVATE ) 0.05 % external solution Apply 1 Application topically daily. qd up to 5 days a week to aa scalp prn psoriasis flares, avoid face, groin, axilla 09/30/23   Hester Alm BROCKS, MD  cromolyn  (OPTICROM ) 4 % ophthalmic solution Place 1 drop into both eyes 3 (three) times daily as needed (itchy eyes). 05/05/20   [provider]  Cyanocobalamin  (B-12) 5000 MCG CAPS Take 5,000 mcg by mouth daily.    [provider]  diphenhydramine -acetaminophen  (TYLENOL  PM) 25-500 MG TABS tablet Take 2 tablets by mouth at bedtime as needed (sleep).    [provider]  Dulaglutide  (TRULICITY ) 3 MG/0.5ML SOAJ Inject 3 mg as directed once a week. 01/05/24   [name], MD  DULERA  100-5 MCG/ACT AERO Inhale 2 puffs into the lungs 2 (two) times daily. 02/26/23   [provider]  ENTRESTO  24-26 MG Take 1 tablet by mouth twice daily 01/23/24   Tolia, Sunit, DO  fluticasone  (FLONASE ) 50 MCG/ACT nasal spray Place 2 sprays into both nostrils daily as needed for allergies. 03/04/23   [name], MD  gabapentin  (NEURONTIN ) 300 MG capsule TAKE 1 CAPSULE BY MOUTH EVERYDAY AT BEDTIME 08/22/23   [name], MD  glucose blood (ACCU-CHEK GUIDE  TEST) test strip Please use to check blood sugar up to three times per day. E11.42 01/09/24   [name], MD  hydrOXYzine  (ATARAX ) 25 MG tablet Take 25 mg by mouth every 8 (eight) hours as needed.    [provider]  Lancets Misc. (ACCU-CHEK SOFTCLIX LANCET DEV) KIT Please use to check blood sugar up to three times per day. E11.42 01/09/24   [name], MD  leflunomide  (ARAVA ) 10 MG tablet Take 10 mg by mouth daily.    [provider]  leflunomide  (ARAVA ) 20 MG tablet Take 20 mg by mouth daily.    [provider]  meloxicam  (MOBIC ) 15 MG tablet Take 1 tablet (15 mg total) by mouth daily. 03/26/23 03/25/24  Madelyn Barnie LABOR, PA-C  metFORMIN  (GLUCOPHAGE -XR) 500 MG 24 hr tablet TAKE 2 TABLETS BY MOUTH TWICE A DAY 01/05/24   [name], MD  metoprolol  succinate (TOPROL -XL) 50 MG 24 hr tablet Take 1 tablet (50 mg total) by mouth daily. Take with or immediately following a meal. 05/23/23   Tolia, Sunit, DO  mirabegron  ER (MYRBETRIQ ) 25 MG TB24 tablet Take 25 mg by mouth daily.    [provider]  mometasone  (ELOCON ) 0.1 % cream APPLY 1 APPLICATION TOPICALLY DAILY AS NEEDED (RASH). 02/04/23   Hester Alm BROCKS, MD  montelukast  (SINGULAIR ) 10 MG tablet TAKE 1 TABLET BY MOUTH EVERY DAY Patient taking differently: Take 10 mg by mouth at bedtime. 08/31/21   Austin Ade, MD  mupirocin  ointment (BACTROBAN ) 2 % Apply topically daily. qd to open sores on arms, chest 09/30/23   Hester Alm BROCKS, MD  nitroGLYCERIN  (NITROSTAT ) 0.4 MG SL tablet Place 1 tablet (0.4 mg total) under the tongue every 5 (five) minutes as needed. 11/26/23   Tolia, Sunit, DO  omeprazole  (PRILOSEC) 40 MG capsule Take 1 capsule (40 mg total) by mouth every morning. 09/02/23   [name], MD  oxyCODONE -acetaminophen  (PERCOCET) 10-325 MG tablet Take 1 tablet by mouth every 6 (six) hours as needed for pain. 01/30/24   [name], MD  rosuvastatin  (CRESTOR ) 10 MG tablet Take 1 tablet (10 mg total) by mouth daily.  06/17/23   [name], MD  sertraline  (ZOLOFT ) 50 MG tablet TAKE 1 TABLET BY MOUTH EVERY DAY 10/15/22   Austin Ade, MD  Thiamine HCl (VITAMIN B-1) 250 MG tablet Take 250 mg by mouth daily.    [provider]  VENTOLIN  HFA 108 (90 Base) MCG/ACT inhaler INHALE 2 PUFFS BY MOUTH EVERY 4 HOURS AS NEEDED FOR WHEEZING OR SHORTNESS OF BREATH 06/02/23   [name], MD    Family History Family History  Problem Relation Age of Onset   Stroke Father 49   Colon cancer Maternal Aunt    Tremor Mother    Lung cancer Brother 74   Rheum arthritis Sister 39   Colon cancer Daughter    COPD Sister    Cancer Sister        in nose   Diabetes Daughter    Hypertension Daughter    Healthy Daughter    Bell's palsy Maternal Grandmother    Esophageal cancer Neg Hx    Rectal cancer Neg Hx    Stomach cancer Neg Hx     Social History Social History   Tobacco Use   Smoking status: Former    Current packs/day: 0.00    Average packs/day: 1.5 packs/day for 5.0 years (7.5 ttl pk-yrs)    Types: Cigarettes    Start date: 05/31/1989    Quit date: 05/31/1994    Years since quitting: 29.7    Passive exposure: Past   Smokeless tobacco: Never  Vaping Use   Vaping status: Never Used  Substance Use Topics   Alcohol use: No    Alcohol/week: 0.0 standard drinks of alcohol   Drug use: No     Allergies   Zofran  [ondansetron  hcl], Ace inhibitors, Levofloxacin , Lisinopril, Sulfonamide derivatives, and Sulfa antibiotics   Review of Systems Review of Systems  Constitutional:  Positive for chills. Negative for fever.  HENT:  Positive for sore throat (from coughing). Negative for congestion, ear pain, rhinorrhea and sneezing.   Respiratory:  Positive for cough (nonproductive), shortness of breath and wheezing.   Cardiovascular:  Positive for chest pain (only when coughing).  Gastrointestinal:  Negative for diarrhea, nausea and vomiting.  Musculoskeletal:  Negative for myalgias.  Neurological:   Positive for headaches.  All other systems reviewed and are negative.    Physical Exam Triage Vital Signs ED Triage Vitals [02/15/24 0818]  Encounter Vitals Group     BP 112/73     Girls Systolic BP Percentile      Girls Diastolic BP Percentile      Boys Systolic BP Percentile      Boys Diastolic BP Percentile      Pulse Rate 86     Resp 20     Temp 97.8 F (36.6 C)     Temp Source Oral     SpO2 96 %     Weight      Height      Head Circumference      Peak Flow      Pain Score 0     Pain Loc      Pain Education      Exclude from Growth Chart    No data found.  Updated Vital Signs BP 112/73 (BP Location: Left Arm)   Pulse 86   Temp 97.8 F (36.6 C) (Oral)   Resp 20   SpO2 96%   Visual Acuity Right Eye Distance:   Left Eye Distance:   Bilateral Distance:    Right Eye  Near:   Left Eye Near:    Bilateral Near:     Physical Exam Vitals reviewed.  Constitutional:      General: She is awake. She is not in acute distress.    Appearance: Normal appearance. She is well-developed. She is not ill-appearing, toxic-appearing or diaphoretic.     Comments: Frail elderly female in no acute distress   HENT:     Head: Normocephalic.     Right Ear: Hearing normal.     Left Ear: Hearing normal.     Nose: Nose normal.     Mouth/Throat:     Mouth: Mucous membranes are moist.  Eyes:     General: Vision grossly intact.     Conjunctiva/sclera: Conjunctivae normal.  Cardiovascular:     Rate and Rhythm: Normal rate and regular rhythm.     Heart sounds: Normal heart sounds.  Pulmonary:     Effort: Pulmonary effort is normal. No tachypnea or accessory muscle usage.     Breath sounds: Normal air entry. No decreased air movement. Rhonchi (diffusely) present. No decreased breath sounds.     Comments: Respirations even and unlabored  Chest:     Comments: Tenderness with associated bruising and a superficial abrasion noted over the right upper chest wall. The bruising appears  yellowish in color (see image below)  Musculoskeletal:        General: Normal range of motion.     Cervical back: Full passive range of motion without pain, normal range of motion and neck supple.  Skin:    General: Skin is warm and dry.     Findings: Bruising present.     Comments: Multiple areas of bruising are observed on both arms (see images below)    Neurological:     General: No focal deficit present.     Mental Status: She is alert and oriented to person, place, and time.  Psychiatric:        Speech: Speech normal.        Behavior: Behavior is cooperative.           UC Treatments / Results  Labs (all labs ordered are listed, but only abnormal results are displayed) Labs Reviewed  POC SOFIA SARS ANTIGEN FIA - Normal    EKG   Radiology DG Chest 2 View Result Date: 02/15/2024 CLINICAL DATA:  Nonproductive cough for 4 days EXAM: CHEST - 2 VIEW COMPARISON:  05/29/2022 FINDINGS: Frontal and lateral views of the chest demonstrate an unremarkable cardiac silhouette. No acute airspace disease, effusion, or pneumothorax. No acute bony abnormalities. IMPRESSION: 1. No acute intrathoracic process. Electronically Signed   By: Ozell Daring M.D.   On: 02/15/2024 09:10    Procedures Procedures (including critical care time)  Medications Ordered in UC Medications  ipratropium-albuterol  (DUONEB) 0.5-2.5 (3) MG/3ML nebulizer solution 3 mL (3 mLs Nebulization Given 02/15/24 0844)    Initial Impression / Assessment and Plan / UC Course  I have reviewed the triage vital signs and the nursing notes.  Pertinent labs & imaging results that were available during my care of the patient were reviewed by me and considered in my medical decision making (see chart for details).     The patient presents with cough since Thursday, accompanied by chest pain with coughing, wheezing, shortness of breath, chills, sore throat, headache, and hoarseness. Lung exam revealed diffuse rhonchi.  Chest x-ray was negative for any acute intrathoracic process, and COVID-19 testing was negative. Clinical presentation is consistent with acute bronchitis with  an asthma exacerbation. The patient was treated with a DuoNeb breathing treatment in clinic and prescribed a course of azithromycin , Mucinex  DM twice daily, Tessalon  as needed for cough, and a Medrol  Dosepak. She was instructed to continue her inhaler as needed and to utilize supportive care measures including rest, hydration, and over-the-counter analgesics as appropriate. Follow-up with the primary care provider is recommended if symptoms do not improve after completing medications, and the patient was advised to seek emergency care for worsening shortness of breath, chest pain, high fever, or other concerning symptoms.  Today's evaluation has revealed no signs of a dangerous process. Discussed diagnosis with patient and/or guardian. Patient and/or guardian aware of their diagnosis, possible red flag symptoms to watch out for and need for close follow up. Patient and/or guardian understands verbal and written discharge instructions. Patient and/or guardian comfortable with plan and disposition.  Patient and/or guardian has a clear mental status at this time, good insight into illness (after discussion and teaching) and has clear judgment to make decisions regarding their care  Documentation was completed with the aid of voice recognition software. Transcription may contain typographical errors.   Final Clinical Impressions(s) / UC Diagnoses   Final diagnoses:  Acute cough  Acute bronchitis due to Rhinovirus  Asthma without status asthmaticus, mild persistent, with acute exacerbation  Skin abrasion     Discharge Instructions      You were seen today for cough, shortness of breath, wheezing, and other respiratory symptoms. Your chest x-ray did not show pneumonia, and your COVID-19 test was negative. Your symptoms are most consistent with  acute bronchitis with an asthma flare. You received a breathing treatment in clinic, and you have been prescribed a course of azithromycin  (antibiotic), Mucinex  DM twice daily, Tessalon  as needed for cough, and a Medrol  Dosepak (steroids). You should also continue using your inhaler as needed for wheezing or shortness of breath. At home, focus on supportive care by getting plenty of rest, drinking fluids such as water or warm tea with honey to help soothe your throat and loosen mucus, and using acetaminophen  or ibuprofen  if needed for fever, headache, or discomfort. A humidifier or warm shower may also help ease chest congestion.  You also have some bruising and superficial abrasions from your playful dog, which do not show any signs of infection at this time. Keep the abrasions clean and dry, and you may apply a thin layer of antibiotic ointment to help prevent infection. Monitor for signs of infection such as increasing redness, swelling, warmth, pus drainage, or worsening pain in the affected areas.   Please follow up with your primary care provider if your symptoms do not improve after completing your medications or if they interfere with your normal activities. You should go to the emergency department if you develop worsening shortness of breath, chest pain, high fever, coughing up blood, or if you feel suddenly worse at any time.      ED Prescriptions     Medication Sig Dispense Auth. Provider   azithromycin  (ZITHROMAX ) 500 MG tablet Take 1 tablet (500 mg total) by mouth daily for 5 days. 5 tablet Iola Lukes, FNP   Dextromethorphan-guaiFENesin  (MUCINEX  DM MAXIMUM STRENGTH) 60-1200 MG TB12 Take 1 tablet by mouth 2 (two) times daily. 20 tablet Iola Lukes, FNP   benzonatate  (TESSALON ) 200 MG capsule Take 1 capsule (200 mg total) by mouth 3 (three) times daily as needed for cough. 30 capsule Afreen Siebels, [place], FNP   methylPREDNISolone  (MEDROL  DOSEPAK) 4 MG TBPK  tablet Take as  directed 21 tablet Iola Lukes, FNP      PDMP not reviewed this encounter.   Iola [place], [place] 02/15/24 [id number]

## 2024-02-15 NOTE — Discharge Instructions (Addendum)
 You were seen today for cough, shortness of breath, wheezing, and other respiratory symptoms. Your chest x-ray did not show pneumonia, and your COVID-19 test was negative. Your symptoms are most consistent with acute bronchitis with an asthma flare. You received a breathing treatment in clinic, and you have been prescribed a course of azithromycin  (antibiotic), Mucinex  DM twice daily, Tessalon  as needed for cough, and a Medrol  Dosepak (steroids). You should also continue using your inhaler as needed for wheezing or shortness of breath. At home, focus on supportive care by getting plenty of rest, drinking fluids such as water or warm tea with honey to help soothe your throat and loosen mucus, and using acetaminophen  or ibuprofen  if needed for fever, headache, or discomfort. A humidifier or warm shower may also help ease chest congestion.  You also have some bruising and superficial abrasions from your playful dog, which do not show any signs of infection at this time. Keep the abrasions clean and dry, and you may apply a thin layer of antibiotic ointment to help prevent infection. Monitor for signs of infection such as increasing redness, swelling, warmth, pus drainage, or worsening pain in the affected areas.   Please follow up with your primary care provider if your symptoms do not improve after completing your medications or if they interfere with your normal activities. You should go to the emergency department if you develop worsening shortness of breath, chest pain, high fever, coughing up blood, or if you feel suddenly worse at any time.

## 2024-03-03 ENCOUNTER — Other Ambulatory Visit: Payer: Self-pay

## 2024-03-03 DIAGNOSIS — M059 Rheumatoid arthritis with rheumatoid factor, unspecified: Secondary | ICD-10-CM

## 2024-03-03 DIAGNOSIS — G8929 Other chronic pain: Secondary | ICD-10-CM

## 2024-03-03 MED ORDER — OXYCODONE-ACETAMINOPHEN 10-325 MG PO TABS: 1.0000 | ORAL_TABLET | Freq: Four times a day (QID) | ORAL | 0 refills | Status: DC | PRN

## 2024-03-04 ENCOUNTER — Ambulatory Visit (INDEPENDENT_AMBULATORY_CARE_PROVIDER_SITE_OTHER): Admitting: Family Medicine

## 2024-03-04 VITALS — BP 104/66 | HR 82 | Ht 62.0 in | Wt 125.0 lb

## 2024-03-04 DIAGNOSIS — E1142 Type 2 diabetes mellitus with diabetic polyneuropathy: Secondary | ICD-10-CM

## 2024-03-04 DIAGNOSIS — G8929 Other chronic pain: Secondary | ICD-10-CM

## 2024-03-04 DIAGNOSIS — Z23 Encounter for immunization: Secondary | ICD-10-CM

## 2024-03-04 LAB — POCT GLYCOSYLATED HEMOGLOBIN (HGB A1C): Hemoglobin A1C: 5.6 % (ref 4.0–5.6)

## 2024-03-04 NOTE — Addendum Note (Signed)
 Addended byBETHA COWARD, Felecia Stanfill on: 03/04/2024 09:01 AM   Modules accepted: Level of Service

## 2024-03-04 NOTE — Progress Notes (Addendum)
    SUBJECTIVE:   CHIEF COMPLAINT / HPI:   38mo f/u for chronic pain on percocet For rheumatoid arthritis. Had knee surgery last year for torn meniscus Pain well-controlled on Percocet Denies falls, syncope She is starting in a safe area Last dose this morning Denies cravings or lack of control with medication dosing Denies any side effects Pain contract signed in 2016  T2DM -Current medication regimen: Metformin  1000 mg daily.  At last visit her metformin  was decreased to this from 1000 mg twice daily. Trulicity  3mg  weekly.  She is also on Crestor  10 mg daily and gabapentin  300 mg nightly -Denies polyuria, polydipsia, abdominal pain, chest pain, shortness of breath -Foot exam: UTD -Eye exam: UTD  Lab Results  Component Value Date   HGBA1C 5.3 12/01/2023   HGBA1C 6.3 09/02/2023   HGBA1C 8.2 (A) 06/02/2023     Got a new dog a few months ago. Playful. She's vaccinated.   PERTINENT  PMH / PSH: RA, DM, HFrEF, HTN  OBJECTIVE:   BP 104/66   Pulse 82   Ht 5' 2 (1.575 m)   Wt 125 lb (56.7 kg)   SpO2 96%   BMI 22.86 kg/m   General: NAD, pleasant, able to participate in exam Cardiac: RRR, no murmurs auscultated Respiratory: CTAB, normal WOB Abdomen: soft, non-tender, non-distended, normoactive bowel sounds Extremities: warm and well perfused, no edema or cyanosis Skin: some scattered bruises on arms - from playing with dog Neuro: alert, no obvious focal deficits, speech normal Psych: Normal affect and mood  ASSESSMENT/PLAN:   Assessment & Plan Encounter for chronic pain management For RA.  Stable.  PDMP reviewed. Percocet refilled for 30-day supply.  This medication allows for her to complete ADLs and care for her family, do work around the house. UDS today Follow-up in 3 months Type 2 diabetes mellitus with diabetic polyneuropathy, without long-term current use of insulin  (HCC) A1c 5.6 today, well controlled Continue metformin  1000 mg daily and Trulicity  3mg   weekly 3 mo f/u, likely decrease metformin  at next visit  Flu, pneumonia vaccines today  Payton Coward, MD [facility]

## 2024-03-04 NOTE — Assessment & Plan Note (Addendum)
 A1c 5.6 today, well controlled Continue metformin  1000 mg daily and Trulicity  3mg  weekly 3 mo f/u, likely decrease metformin  at next visit

## 2024-03-04 NOTE — Patient Instructions (Addendum)
 If any of your results from today are abnormal and/or require changes to your medical care, I will give you a call. Otherwise, I will send you a letter in the mail or a message on MyChart.   Continue current medications   A1c 5.6

## 2024-03-04 NOTE — Assessment & Plan Note (Addendum)
 For RA.  Stable.  PDMP reviewed. Percocet refilled for 30-day supply.  This medication allows for her to complete ADLs and care for her family, do work around the house. UDS today Follow-up in 3 months

## 2024-03-08 DIAGNOSIS — M47816 Spondylosis without myelopathy or radiculopathy, lumbar region: Secondary | ICD-10-CM | POA: Diagnosis not present

## 2024-03-09 ENCOUNTER — Other Ambulatory Visit: Payer: Self-pay | Admitting: Family Medicine

## 2024-03-09 DIAGNOSIS — K219 Gastro-esophageal reflux disease without esophagitis: Secondary | ICD-10-CM

## 2024-03-10 ENCOUNTER — Other Ambulatory Visit: Payer: Self-pay

## 2024-03-10 MED ORDER — SERTRALINE HCL 50 MG PO TABS: 50.0000 mg | ORAL_TABLET | Freq: Every day | ORAL | 3 refills | Status: AC

## 2024-03-16 ENCOUNTER — Encounter: Payer: Self-pay | Admitting: Podiatry

## 2024-03-16 ENCOUNTER — Ambulatory Visit: Admitting: Podiatry

## 2024-03-16 DIAGNOSIS — L97521 Non-pressure chronic ulcer of other part of left foot limited to breakdown of skin: Secondary | ICD-10-CM | POA: Diagnosis not present

## 2024-03-16 DIAGNOSIS — E11621 Type 2 diabetes mellitus with foot ulcer: Secondary | ICD-10-CM | POA: Diagnosis not present

## 2024-03-16 DIAGNOSIS — E1151 Type 2 diabetes mellitus with diabetic peripheral angiopathy without gangrene: Secondary | ICD-10-CM

## 2024-03-16 DIAGNOSIS — M79674 Pain in right toe(s): Secondary | ICD-10-CM | POA: Diagnosis not present

## 2024-03-16 DIAGNOSIS — B351 Tinea unguium: Secondary | ICD-10-CM | POA: Diagnosis not present

## 2024-03-16 DIAGNOSIS — L080 Pyoderma: Secondary | ICD-10-CM | POA: Diagnosis not present

## 2024-03-16 DIAGNOSIS — M79675 Pain in left toe(s): Secondary | ICD-10-CM

## 2024-03-16 DIAGNOSIS — G629 Polyneuropathy, unspecified: Secondary | ICD-10-CM

## 2024-03-16 MED ORDER — DOXYCYCLINE HYCLATE 100 MG PO CAPS: 100.0000 mg | ORAL_CAPSULE | Freq: Two times a day (BID) | ORAL | 0 refills | Status: AC

## 2024-03-16 MED ORDER — GENTAMICIN SULFATE 0.1 % EX CREA: TOPICAL_CREAM | CUTANEOUS | 1 refills | Status: DC

## 2024-03-16 NOTE — Progress Notes (Signed)
 Subjective:  Patient ID: Michelle [name], female    DOB: [birth year],  MRN: [id number]  CASILDA PICKERILL presents to clinic today for at risk footcare. Patient has h/o diabetes, neuropathy and PAD and is seen for  and painful mycotic toenails of both feet that are difficult to trim. Pain interferes with daily activities and wearing enclosed shoe gear comfortably.  Chief Complaint  Patient presents with   Diabetes    Patient stated that her A1c is 5.6, she has not checked her B.S, she last seen her PCP 3 weeks ago , PCP name is [facility].     PCP is [name], MD.  Today, patient states she is having pain of left 3rd toe. Toe is painful with ambulation with and without shoe gear. It is tender at the distal tip. She has not attempted any treatment. Denies any swelling, drainage, fever, chills, night sweats, nausea or vomiting.   Allergies  Allergen Reactions   Zofran  [Ondansetron  Hcl] Other (See Comments)    Prolong QT   Ace Inhibitors Cough    Occurred with lisinopril   Levofloxacin  Other (See Comments)    Felt things on her legs that were not there,made her stomach hurt   Lisinopril Cough   Sulfonamide Derivatives Hives and Itching   Sulfa Antibiotics Hives and Itching    Review of Systems: Negative except as noted in the HPI.  Objective: No changes noted in today's physical examination. There were no vitals filed for this visit. BRIANNA BENNETT is a pleasant 73 y.o. female WD, WN in NAD. AAO x 3.  Vascular Examination: CFT <3 seconds b/l. Pedal pulses faintly palpable RLE and nonpalpable LLE. Skin temperature gradient warm to warm b/l. No pain with calf compression. No ischemia or gangrene. No cyanosis or clubbing noted b/l.    Neurological Examination: Sensation grossly intact b/l with 10 gram monofilament. Vibratory sensation intact b/l. Pt has subjective symptoms of neuropathy.  Dermatological Examination:    Wound Location: distal tip of left 3rd toe There  is a moderate amount of devitalized tissue present in the wound. Predebridement Wound Measurement:  0.5  x 0.5 x 0 cm with hyperkeratotic roof Postdebridement Wound Measurement: 0.5 x 0.3 x 0.1 cm. Wound Base: Granular/Healthy Peri-wound: Normal Exudate: Scant/small amount Serosanguinous exudate Blood Loss during debridement: 0 cc('s). Sign(s) of clinical bacterial infection: serosanguinous drainage and pain on palpation  Pedal skin warm and supple b/l. Excoriation noted dorsal 5th MPJ with blanchable erythema. No warmth, no fluctuance. No drainage.. No interdigital macerations. Toenails 1-5 b/l thick, discolored, elongated with subungual debris and pain on dorsal palpation.  No corns, calluses nor porokeratotic lesions noted.  Musculoskeletal Examination: Muscle strength 5/5 to all lower extremity muscle groups bilaterally. HAV with bunion deformity noted b/l LE. Patient ambulates independent of any assistive aids.  Radiographs: None  Lab Results  Component Value Date   HGBA1C 5.6 03/04/2024   HGBA1C 5.3 12/01/2023   HGBA1C 6.3 09/02/2023   Assessment/Plan: 1. Pain due to onychomycosis of toenails of both feet   2. Neuropathy   3. Diabetes mellitus with peripheral circulatory disorder (HCC)   4. Diabetic ulcer of toe of left foot associated with type 2 diabetes mellitus, limited to breakdown of skin (HCC)     Plan: -Patient was evaluated and treated and all questions answered.  -Patient/POA/Family member educated on diagnosis and treatment plan of routine ulcer debridement/wound care.  -Ulceration debridement achieved utilizing sharp excisional debridement to level of dermis with sterile scalpel  blade and sterile currette.. Type/amount of devitalized tissue removed: nonviable hyperkeratosis -Today's ulcer size post-debridement: 0.3 x 0.5 x 0.1 cm. -Ulceration cleansed with wound cleanser. Betadine Solution applied to base of ulceration and secured with light dressing. -Wound  responded well to today's debridement and noted pain relief after today's treatment. -Asberry GORMAN Lares given written instructions on daily wound care for distal tip of left 3rd toe ulceration. -Prescription written for Gentamicin  Cream. Patient is to apply to distal tip of left 3rd toe once daily and cover with dressing.  -Rx for Doxycyline 100 mg, #14, to be taken one capsule twice daily for 7 days. She is to follow up with Dr. Janit in 2 weeks for left 3rd digit diabetic ulceration. -Wound culture and sensitivity ordered today (Sagis). -Toenails 1-5 debrided in length and girth without incident. Continue foot and shoe inspections daily.  -Monitor blood glucose per PCP/Endocrinologist's recommendations.  -Continue soft, supportive shoe gear daily. Report any pedal injuries to medical professional. Call office if there are any questions/concerns. -Patient/POA to call should there be question/concern in the interim.   Return in about 3 months (around 06/15/2024).  Delon LITTIE Merlin, DPM      [facility] LOCATION: [address], [place] [address]                   Office [phone]   [facility] LOCATION: [address] [place], [place] [address] Office [phone]

## 2024-03-16 NOTE — Patient Instructions (Signed)
   DRESSING CHANGES LEFT THIRD TOE:   PHARMACY SHOPPING LIST: Saline or Wound Cleanser for cleaning wound 2 x 2 inch sterile gauze for cleaning wound GENTAMICIN  CREAM  A. IF DISPENSED, WEAR SURGICAL SHOE OR WALKING BOOT AT ALL TIMES.  KEEP LEFT FOOT DRY AT ALL TIMES!!!!  CLEANSE ULCER WITH SALINE OR WOUND CLEANSER.  DAB DRY WITH GAUZE SPONGE.  APPLY A LIGHT AMOUNT OF GENTAMICIN  CREAM TO BASE OF ULCER.  APPLY FABRIC BAND-AID.  WEAR TOE CREST AT ALL TIMES.   DO NOT WALK BAREFOOT!!!  IF YOU EXPERIENCE ANY FEVER, CHILLS, NIGHTSWEATS, NAUSEA OR VOMITING, ELEVATED OR LOW BLOOD SUGARS, REPORT TO EMERGENCY ROOM.  IF YOU EXPERIENCE INCREASED REDNESS, PAIN, SWELLING, DISCOLORATION, ODOR, PUS, DRAINAGE OR WARMTH OF YOUR FOOT, REPORT TO EMERGENCY ROOM.

## 2024-03-18 ENCOUNTER — Other Ambulatory Visit: Payer: Self-pay | Admitting: Podiatry

## 2024-03-23 ENCOUNTER — Other Ambulatory Visit (INDEPENDENT_AMBULATORY_CARE_PROVIDER_SITE_OTHER): Admitting: Podiatry

## 2024-03-23 ENCOUNTER — Telehealth: Payer: Self-pay | Admitting: Lab

## 2024-03-23 DIAGNOSIS — B379 Candidiasis, unspecified: Secondary | ICD-10-CM

## 2024-03-23 DIAGNOSIS — T3695XA Adverse effect of unspecified systemic antibiotic, initial encounter: Secondary | ICD-10-CM

## 2024-03-23 MED ORDER — FLUCONAZOLE 150 MG PO TABS: ORAL_TABLET | ORAL | 0 refills | Status: AC

## 2024-03-23 NOTE — Progress Notes (Signed)
 1. Antibiotic-induced yeast infection    Meds ordered this encounter  Medications   fluconazole  (DIFLUCAN ) 150 MG tablet    Sig: Take one dose by mouth today.    Dispense:  1 tablet    Refill:  0

## 2024-03-23 NOTE — Telephone Encounter (Signed)
 Patient states was put on antibiotics by Dr. Gaynel and now has a yeast infection and would like something called into pharmacy.

## 2024-03-23 NOTE — Telephone Encounter (Signed)
 Patient informed.

## 2024-03-29 ENCOUNTER — Ambulatory Visit (INDEPENDENT_AMBULATORY_CARE_PROVIDER_SITE_OTHER): Admitting: Podiatry

## 2024-03-29 ENCOUNTER — Encounter: Payer: Self-pay | Admitting: Podiatry

## 2024-03-29 DIAGNOSIS — L97522 Non-pressure chronic ulcer of other part of left foot with fat layer exposed: Secondary | ICD-10-CM | POA: Diagnosis not present

## 2024-03-29 DIAGNOSIS — E08621 Diabetes mellitus due to underlying condition with foot ulcer: Secondary | ICD-10-CM | POA: Diagnosis not present

## 2024-03-31 ENCOUNTER — Other Ambulatory Visit: Payer: Self-pay

## 2024-03-31 DIAGNOSIS — M059 Rheumatoid arthritis with rheumatoid factor, unspecified: Secondary | ICD-10-CM

## 2024-03-31 DIAGNOSIS — G8929 Other chronic pain: Secondary | ICD-10-CM

## 2024-03-31 MED ORDER — OXYCODONE-ACETAMINOPHEN 10-325 MG PO TABS: 1.0000 | ORAL_TABLET | Freq: Four times a day (QID) | ORAL | 0 refills | Status: DC | PRN

## 2024-04-02 NOTE — Progress Notes (Signed)
 Chief Complaint  Patient presents with   Foot Ulcer    Follow up ulcer 3rd toe left   Its a lot better than it was  She states she sometimes still has pain in the left heel-feels like walking on knots    HPI: 73 y.o. female presenting today for evaluation of an ulcer to the left third toe referred by Dr. May here in our office.  Patient states that she is doing much better.  No new complaints  Past Medical History:  Diagnosis Date   Actinic keratosis    Allergic rhinitis 08/28/2006   Anemia    low iron    Anginal pain    Asthma, chronic 03/14/2010   Does seem to use her albuterol  excessively. Consider change to more manageable medicines and treat more like COPD in the future.   Carpal tunnel syndrome, left 05/29/2009   Last Assessment & Plan:  Advised to wear wrist brace at night when she is sleeping.  If this does not help will consider having her go to sports medicine.   CHF (congestive heart failure) (HCC) 07/2022   HFrEF, NYHA Class I   Chronic frontal sinusitis 08/28/2016   Chronic left maxillary sinusitis 08/28/2016   Diverticulosis    Essential hypertension 10/30/2012   Gastroparesis 11/19/2011   DM and chronic narcotics contribute.  Causes dysphagia like symptoms    Generalized anxiety disorder 08/28/2006   GERD (gastroesophageal reflux disease)    Hearing loss 07/19/2015   Hearing aid in right ear   History of cataracts, bilateral    Hyperlipidemia 08/28/2006   Lumbar radiculopathy 04/07/2018   Lung nodules    Nonischemic cardiomyopathy (HCC)    Osteoporosis 08/08/2011   Pain in joint, pelvic region and thigh 11/18/2012   Radial styloid tenosynovitis 08/15/2013   Restless leg syndrome 05/14/2012   Seropositive rheumatoid arthritis 01/08/2010   Patient is being followed by rheumatologist at [facility] [place]  patient is on Plaquenil and as well as prednisone .   Changed to methotrexate and prednisone  in May of 2013  Patient does have a pain  contract here [facility] receives oxycodone  10/325 mg tabs every 6 hours. This has been titrated down from significant doses of methadone previously. Could attempt to tit   Shoulder joint pain 03/25/2011   The patient is compensating due to her not being able to use her left arm right now rotator cuff impingement if he continues to worsen would not be surprised if she does get a tear on this side as well   Squamous cell carcinoma in situ of skin 11/25/2014   Referred to dermatology for excision    Squamous cell carcinoma of skin 12/26/2014   Right distal pretibial. SCC-KA pattern.   Squamous cell carcinoma of skin 03/27/2015   Right inf. lat. knee. SCC-KA pattern.    Squamous cell carcinoma of skin 10/07/2016   Left lateral calf superior. WD SCC.   Squamous cell carcinoma of skin 10/07/2016   Left lateral calf inferior. WD SCC.   Squamous cell carcinoma of skin 04/28/2018   Left mid med. pretibial. WD SCC.   Squamous cell carcinoma of skin 08/12/2019   Left mid med. pretibial sup. KA type. EDC   Squamous cell carcinoma of skin 08/12/2019   Left mid med. pretibial inferior. WD. EDC.   Tear of medial meniscus of knee, left 04/27/2009   Type II diabetes mellitus 08/28/2006   Overview:  Patient has her ups and downs, go in line  with flares of her RA and prednisone  uses.  Patient does have hx of hypoglycemic events so would not try for perfect control but should have A1c goal around 7.0 Lab Results  Component Value Date   HGBA1C 6.9 12/27/2011   Last Assessment & Plan:  At goal. Will continue current metformin     Past Surgical History:  Procedure Laterality Date   ABDOMINAL HYSTERECTOMY     APPENDECTOMY     BACK SURGERY     BIOPSY  05/10/2021   Procedure: BIOPSY;  Surgeon: Teressa Toribio SQUIBB, MD;  Location: [facility];  Service: Endoscopy;;  EGD and COLON   CARPAL TUNNEL RELEASE Right    CATARACT EXTRACTION Left    with lid lift   CHOLECYSTECTOMY     COLONOSCOPY      COLONOSCOPY WITH PROPOFOL  N/A 05/10/2021   Procedure: COLONOSCOPY WITH PROPOFOL ;  Surgeon: Teressa Toribio SQUIBB, MD;  Location: [facility];  Service: Endoscopy;  Laterality: N/A;   ESOPHAGOGASTRODUODENOSCOPY (EGD) WITH PROPOFOL  N/A 05/10/2021   Procedure: ESOPHAGOGASTRODUODENOSCOPY (EGD) WITH PROPOFOL ;  Surgeon: Teressa Toribio SQUIBB, MD;  Location: [facility];  Service: Endoscopy;  Laterality: N/A;   HERNIA REPAIR     KNEE ARTHROSCOPY WITH MEDIAL MENISECTOMY Right 03/26/2023   Procedure: KNEE ARTHROSCOPY WITH MEDIAL MENISECTOMY;  Surgeon: Cristy Bonner DASEN, MD;  Location: [facility];  Service: Orthopedics;  Laterality: Right;   KNEE SURGERY Right    x2- cartilage annd scar tissue   LEFT AND RIGHT HEART CATHETERIZATION WITH CORONARY ANGIOGRAM N/A 08/17/2013   Procedure: LEFT AND RIGHT HEART CATHETERIZATION WITH CORONARY ANGIOGRAM;  Surgeon: Erick JONELLE Bergamo, MD;  Location: [facility] CATH LAB;  Service: Cardiovascular;  Laterality: N/A;   lumbar back surgery  04/04/2018   done by neurosurgeron   right foot bone spurs removed     RIGHT/LEFT HEART CATH AND CORONARY ANGIOGRAPHY N/A 07/30/2022   Procedure: RIGHT/LEFT HEART CATH AND CORONARY ANGIOGRAPHY;  Surgeon: Elmira Newman PARAS, MD;  Location: [facility] INVASIVE CV LAB;  Service: Cardiovascular;  Laterality: N/A;   ROTATOR CUFF REPAIR Right    SHOULDER SURGERY Left    x2- rotatar cuff tear   SYNOVECTOMY Right 03/26/2023   Procedure: SYNOVECTOMY;  Surgeon: Cristy Bonner DASEN, MD;  Location: [facility];  Service: Orthopedics;  Laterality: Right;   UPPER GASTROINTESTINAL ENDOSCOPY      Allergies  Allergen Reactions   Zofran  [Ondansetron  Hcl] Other (See Comments)    Prolong QT   Ace Inhibitors Cough    Occurred with lisinopril   Levofloxacin  Other (See Comments)    Felt things on her legs that were not there,made her stomach hurt   Lisinopril Cough   Sulfonamide Derivatives Hives and Itching   Sulfa Antibiotics Hives and Itching    LT third toe postdebridement  03/16/2024  LT third toe 03/16/2024  Physical Exam: General: The patient is alert and oriented x3 in no acute distress.  Dermatology: Skin is warm, dry and supple bilateral lower extremities.  The wound to the distal tip of the left third toe has healed and resolved completely.  Complete reepithelialization is occurred  Vascular: Palpable pedal pulses bilaterally. Capillary refill within normal limits.  No appreciable edema.  No erythema.  Neurological: Grossly intact via light touch  Musculoskeletal Exam: No pedal deformities noted   Assessment/Plan of Care: 1.  Ulcer third digit left foot; resolved  -Patient evaluated -Recommend good supportive shoes and sneakers -Return to clinic next scheduled appointment with Dr. May for routine footcare  Thresa EMERSON Sar, DPM [facility]  Dr. Thresa EMERSON Sar, DPM    [address] [place], [place] [address]                Office [phone]  Fax [phone]

## 2024-04-09 ENCOUNTER — Other Ambulatory Visit: Payer: Self-pay | Admitting: Family Medicine

## 2024-04-09 DIAGNOSIS — E1142 Type 2 diabetes mellitus with diabetic polyneuropathy: Secondary | ICD-10-CM

## 2024-04-18 ENCOUNTER — Other Ambulatory Visit: Payer: Self-pay | Admitting: Cardiology

## 2024-04-18 DIAGNOSIS — I5022 Chronic systolic (congestive) heart failure: Secondary | ICD-10-CM

## 2024-04-27 ENCOUNTER — Other Ambulatory Visit (HOSPITAL_COMMUNITY): Payer: Self-pay

## 2024-04-27 ENCOUNTER — Telehealth: Payer: Self-pay | Admitting: Pharmacy Technician

## 2024-04-27 DIAGNOSIS — I5022 Chronic systolic (congestive) heart failure: Secondary | ICD-10-CM

## 2024-04-27 MED ORDER — SACUBITRIL-VALSARTAN 24-26 MG PO TABS: 1.0000 | ORAL_TABLET | Freq: Two times a day (BID) | ORAL | 2 refills | Status: AC

## 2024-04-27 NOTE — Telephone Encounter (Signed)
 Refill for Entresto  has been sent to pharmacy with daw y1 brand in the note to pharmacy section.

## 2024-04-27 NOTE — Telephone Encounter (Signed)
 Hi, insurance will pay for brand. Can this be sent in as daw y1 brand for entresto -copay would be 0.00

## 2024-04-27 NOTE — Addendum Note (Signed)
 Addended by: GORDON RONAL SQUIBB on: 04/27/2024 01:11 PM   Modules accepted: Orders

## 2024-04-30 ENCOUNTER — Encounter: Payer: Self-pay | Admitting: Family Medicine

## 2024-04-30 ENCOUNTER — Emergency Department (HOSPITAL_COMMUNITY)
Admission: EM | Admit: 2024-04-30 | Discharge: 2024-04-30 | Disposition: A | Discharge status: Home / Self Care | Attending: Emergency Medicine | Admitting: Emergency Medicine

## 2024-04-30 ENCOUNTER — Ambulatory Visit (INDEPENDENT_AMBULATORY_CARE_PROVIDER_SITE_OTHER): Admitting: Family Medicine

## 2024-04-30 ENCOUNTER — Other Ambulatory Visit: Payer: Self-pay

## 2024-04-30 VITALS — BP 77/61 | HR 83 | Temp 97.3°F | Ht 62.0 in | Wt 122.1 lb

## 2024-04-30 DIAGNOSIS — I5020 Unspecified systolic (congestive) heart failure: Secondary | ICD-10-CM | POA: Insufficient documentation

## 2024-04-30 DIAGNOSIS — I959 Hypotension, unspecified: Secondary | ICD-10-CM

## 2024-04-30 DIAGNOSIS — R7402 Elevation of levels of lactic acid dehydrogenase (LDH): Secondary | ICD-10-CM | POA: Insufficient documentation

## 2024-04-30 DIAGNOSIS — Z7951 Long term (current) use of inhaled steroids: Secondary | ICD-10-CM | POA: Insufficient documentation

## 2024-04-30 DIAGNOSIS — E876 Hypokalemia: Secondary | ICD-10-CM | POA: Diagnosis not present

## 2024-04-30 DIAGNOSIS — R197 Diarrhea, unspecified: Secondary | ICD-10-CM

## 2024-04-30 DIAGNOSIS — J45909 Unspecified asthma, uncomplicated: Secondary | ICD-10-CM | POA: Insufficient documentation

## 2024-04-30 DIAGNOSIS — Z79899 Other long term (current) drug therapy: Secondary | ICD-10-CM | POA: Insufficient documentation

## 2024-04-30 DIAGNOSIS — I110 Hypertensive heart disease with heart failure: Secondary | ICD-10-CM | POA: Diagnosis not present

## 2024-04-30 DIAGNOSIS — E119 Type 2 diabetes mellitus without complications: Secondary | ICD-10-CM | POA: Diagnosis not present

## 2024-04-30 DIAGNOSIS — Z7984 Long term (current) use of oral hypoglycemic drugs: Secondary | ICD-10-CM | POA: Diagnosis not present

## 2024-04-30 LAB — COMPREHENSIVE METABOLIC PANEL WITH GFR
ALT: 12 U/L (ref 0–44)
AST: 20 U/L (ref 15–41)
Albumin: 3 g/dL — ABNORMAL LOW (ref 3.5–5.0)
Alkaline Phosphatase: 57 U/L (ref 38–126)
Anion gap: 7 (ref 5–15)
BUN: 10 mg/dL (ref 8–23)
CO2: 25 mmol/L (ref 22–32)
Calcium: 8.3 mg/dL — ABNORMAL LOW (ref 8.9–10.3)
Chloride: 108 mmol/L (ref 98–111)
Creatinine, Ser: 0.56 mg/dL (ref 0.44–1.00)
GFR, Estimated: >60 mL/min (ref >60)
Glucose, Bld: 101 mg/dL — ABNORMAL HIGH (ref 70–99)
Potassium: 3.3 mmol/L — ABNORMAL LOW (ref 3.5–5.1)
Sodium: 140 mmol/L (ref 135–145)
Total Bilirubin: 0.7 mg/dL (ref 0.0–1.2)
Total Protein: 5.1 g/dL — ABNORMAL LOW (ref 6.5–8.1)

## 2024-04-30 LAB — CBC WITH DIFFERENTIAL/PLATELET
Abs Immature Granulocytes: 0.03 K/uL (ref 0.00–0.07)
Basophils Absolute: 0 K/uL (ref 0.0–0.1)
Basophils Relative: 1 %
Eosinophils Absolute: 0.1 K/uL (ref 0.0–0.5)
Eosinophils Relative: 1 %
HCT: 32.9 % — ABNORMAL LOW (ref 36.0–46.0)
Hemoglobin: 10.7 g/dL — ABNORMAL LOW (ref 12.0–15.0)
Immature Granulocytes: 0 %
Lymphocytes Relative: 26 %
Lymphs Abs: 2.1 K/uL (ref 0.7–4.0)
MCH: 32.9 pg (ref 26.0–34.0)
MCHC: 32.5 g/dL (ref 30.0–36.0)
MCV: 101.2 fL — ABNORMAL HIGH (ref 80.0–100.0)
Monocytes Absolute: 0.7 K/uL (ref 0.1–1.0)
Monocytes Relative: 8 %
Neutro Abs: 5.1 K/uL (ref 1.7–7.7)
Neutrophils Relative %: 64 %
Platelets: 292 K/uL (ref 150–400)
RBC: 3.25 MIL/uL — ABNORMAL LOW (ref 3.87–5.11)
RDW: 13.8 % (ref 11.5–15.5)
WBC: 8 K/uL (ref 4.0–10.5)
nRBC: 0 % (ref 0.0–0.2)

## 2024-04-30 LAB — I-STAT CG4 LACTIC ACID, ED
Lactic Acid, Venous: 2.7 mmol/L (ref 0.5–1.9)
Lactic Acid, Venous: 1.3 mmol/L (ref 0.5–1.9)

## 2024-04-30 LAB — MAGNESIUM: Magnesium: 1.7 mg/dL (ref 1.7–2.4)

## 2024-04-30 MED ORDER — LOPERAMIDE HCL 2 MG PO CAPS: 2.0000 mg | ORAL_CAPSULE | ORAL | 0 refills | Status: DC | PRN

## 2024-04-30 MED ORDER — POTASSIUM CHLORIDE 20 MEQ PO PACK
40.0000 meq | PACK | Freq: Two times a day (BID) | ORAL | Status: DC
  Administered 2024-04-30: 40 meq via ORAL
  Filled 2024-04-30: qty 2

## 2024-04-30 MED ORDER — LACTATED RINGERS IV BOLUS
1000.0000 mL | Freq: Once | INTRAVENOUS | Status: AC
  Administered 2024-04-30: 1000 mL via INTRAVENOUS

## 2024-04-30 MED ORDER — ONDANSETRON HCL 4 MG/2ML IJ SOLN
4.0000 mg | Freq: Once | INTRAMUSCULAR | Status: AC
  Administered 2024-04-30: 4 mg via INTRAVENOUS
  Filled 2024-04-30: qty 2

## 2024-04-30 NOTE — ED Notes (Signed)
 Lactic result given to danielle j.rn by at

## 2024-04-30 NOTE — ED Provider Notes (Signed)
  EMERGENCY DEPARTMENT AT [facility] Provider Note   CSN: [id number] Arrival date & time: 04/30/24  9079     History Chief Complaint  Patient presents with   Diarrhea    HPI: Michelle [name] is a 73 y.o. female with history pertinent for asthma, T2DM, HLD, GERD, RLS, HTN, urge incontinence, HFrEF who presents complaining of low blood pressure in the setting of diarrhea. Patient arrived via POV.  History provided by patient.  No interpreter required during this encounter.  Patient reports that she has had nausea, vomiting, diarrhea over the past 3 weeks.  Reports that symptoms are persistent.  Reports that she will have intermittent diffuse abdominal cramping.  Reports that given the symptoms she presented to her primary care doctor who found her blood pressure to be low at 70s/60s, therefore advised her to come to the emergency department for a fluid bolus.  Denies fever, chills, chest pain, shortness of breath.  Patient's recorded medical, surgical, social, medication list and allergies were reviewed in the Snapshot window as part of the initial history.   Prior to Admission medications   Medication Sig Start Date End Date Taking? Authorizing Provider  Accu-Chek Softclix Lancets lancets Please use to check blood sugar up to three times per day. E11.42 01/09/24   [name], MD  benzonatate  (TESSALON ) 200 MG capsule Take 1 capsule (200 mg total) by mouth 3 (three) times daily as needed for cough. 02/15/24   Iola Lukes, FNP  betamethasone  dipropionate 0.05 % lotion Apply topically daily. 06/15/20   [provider]  Blood Glucose Monitoring Suppl (ACCU-CHEK GUIDE ME) w/Device KIT USE AS DIRECTED TO  CHECK  BLOOD  SUGAR  UP  TO  3  TIMES  A  DAY 04/09/24   Nicholas Bar, MD  BREO ELLIPTA  200-25 MCG/ACT AEPB Inhale 1 puff into the lungs daily.    [provider]  cetirizine  (ZYRTEC ) 10 MG tablet Take 10 mg by mouth daily.    [provider]  cholestyramine  (QUESTRAN ) 4 g packet Take 1 packet (4 g total) by mouth daily. 06/27/23   Christia Budds, MD  clobetasol  (TEMOVATE ) 0.05 % external solution Apply 1 Application topically daily. qd up to 5 days a week to aa scalp prn psoriasis flares, avoid face, groin, axilla 09/30/23   Hester Alm BROCKS, MD  cromolyn  (OPTICROM ) 4 % ophthalmic solution Place 1 drop into both eyes 3 (three) times daily as needed (itchy eyes). 05/05/20   [provider]  Cyanocobalamin  (B-12) 5000 MCG CAPS Take 5,000 mcg by mouth daily.    [provider]  Dextromethorphan-guaiFENesin  (MUCINEX  DM MAXIMUM STRENGTH) 60-1200 MG TB12 Take 1 tablet by mouth 2 (two) times daily. 02/15/24   Murrill, Samantha, FNP  diphenhydramine -acetaminophen  (TYLENOL  PM) 25-500 MG TABS tablet Take 2 tablets by mouth at bedtime as needed (sleep).    [provider]  Dulaglutide  (TRULICITY ) 3 MG/0.5ML SOAJ Inject 3 mg as directed once a week. 01/05/24   [name], MD  DULERA  100-5 MCG/ACT AERO Inhale 2 puffs into the lungs 2 (two) times daily. 02/26/23   [provider]  fluconazole  (DIFLUCAN ) 150 MG tablet Take one dose by mouth today. 03/23/24   Galaway, Jennifer L, DPM  fluticasone  (FLONASE ) 50 MCG/ACT nasal spray USE 2 SPRAY(S) IN EACH NOSTRIL ONCE DAILY AS NEEDED FOR ALLERGIES 03/09/24   [name], MD  gabapentin  (NEURONTIN ) 300 MG capsule TAKE 1 CAPSULE BY MOUTH EVERYDAY AT BEDTIME 08/22/23   Mahmood, [place],  MD  gentamicin  cream (GARAMYCIN ) 0.1 % Apply to affected toe once daily. 03/16/24   Gaynel Delon CROME, DPM  glucose blood (ACCU-CHEK GUIDE TEST) test strip Please use to check blood sugar up to three times per day. E11.42 01/09/24   [name], MD  hydrOXYzine  (ATARAX ) 25 MG tablet Take 25 mg by mouth every 8 (eight) hours as needed.    [provider]  Lancets Misc. (ACCU-CHEK SOFTCLIX LANCET DEV) KIT Please use to check blood sugar up to three times per day. E11.42  01/09/24   [name], MD  leflunomide  (ARAVA ) 10 MG tablet Take 10 mg by mouth daily.    [provider]  leflunomide  (ARAVA ) 20 MG tablet Take 20 mg by mouth daily.    [provider]  loperamide  (IMODIUM ) 2 MG capsule Take 1 capsule (2 mg total) by mouth as needed for diarrhea or loose stools. 04/30/24   [name], MD  metFORMIN  (GLUCOPHAGE -XR) 500 MG 24 hr tablet TAKE 2 TABLETS BY MOUTH TWICE A DAY 01/05/24   [name], MD  methylPREDNISolone  (MEDROL  DOSEPAK) 4 MG TBPK tablet Take as directed 02/15/24   Iola Lukes, FNP  metoprolol  succinate (TOPROL -XL) 50 MG 24 hr tablet Take 1 tablet (50 mg total) by mouth daily. Take with or immediately following a meal. 05/23/23   Tolia, Sunit, DO  mirabegron  ER (MYRBETRIQ ) 25 MG TB24 tablet Take 25 mg by mouth daily.    [provider]  mometasone  (ELOCON ) 0.1 % cream APPLY 1 APPLICATION TOPICALLY DAILY AS NEEDED (RASH). 02/04/23   Hester Alm BROCKS, MD  montelukast  (SINGULAIR ) 10 MG tablet TAKE 1 TABLET BY MOUTH EVERY DAY Patient taking differently: Take 10 mg by mouth at bedtime. 08/31/21   Austin Ade, MD  mupirocin  ointment (BACTROBAN ) 2 % Apply topically daily. qd to open sores on arms, chest 09/30/23   Hester Alm BROCKS, MD  mupirocin  ointment (BACTROBAN ) 2 % Apply 1 Application topically. 01/24/22   [provider]  nitrofurantoin (MACRODANTIN) 100 MG capsule Take 100 mg by mouth daily. 01/04/24   [provider]  nitroGLYCERIN  (NITROSTAT ) 0.4 MG SL tablet Place 1 tablet (0.4 mg total) under the tongue every 5 (five) minutes as needed. 11/26/23   Tolia, Sunit, DO  omeprazole  (PRILOSEC) 40 MG capsule Take 1 capsule by mouth in the morning 03/09/24   [name], MD  oxyCODONE -acetaminophen  (PERCOCET) 10-325 MG tablet Take 1 tablet by mouth every 6 (six) hours as needed for pain. 03/31/24   Nicholas Bar, MD  rosuvastatin  (CRESTOR ) 10 MG tablet Take 1 tablet (10 mg total) by mouth daily. 06/17/23    [name], MD  sacubitril-valsartan (ENTRESTO ) 24-26 MG Take 1 tablet by mouth 2 (two) times daily. 04/27/24   Tolia, Sunit, DO  sertraline  (ZOLOFT ) 50 MG tablet Take 1 tablet (50 mg total) by mouth daily. 03/10/24   [name], MD  Thiamine HCl (VITAMIN B-1) 250 MG tablet Take 250 mg by mouth daily.    [provider]  triamcinolone  cream (KENALOG ) 0.1 % Apply 1 Application topically 2 (two) times daily. 08/11/13   [provider]  VENTOLIN  HFA 108 (90 Base) MCG/ACT inhaler INHALE 2 PUFFS BY MOUTH EVERY 4 HOURS AS NEEDED FOR WHEEZING OR SHORTNESS OF BREATH 06/02/23   [name], MD     Allergies: Zofran  [ondansetron  hcl], Ace inhibitors, Levofloxacin , Lisinopril, Sulfonamide derivatives, and Sulfa antibiotics   Review of Systems   ROS as per HPI  Physical Exam Updated Vital Signs BP 113/78  Pulse 82   Temp 98.6 F (37 C) (Oral)   Resp 18   Ht 5' 2 (1.575 m)   Wt 55.3 kg   SpO2 96%   BMI 22.31 kg/m  Physical Exam Vitals and nursing note reviewed.  Constitutional:      General: She is not in acute distress.    Appearance: She is well-developed.  HENT:     Head: Normocephalic and atraumatic.  Eyes:     Conjunctiva/sclera: Conjunctivae normal.  Cardiovascular:     Rate and Rhythm: Normal rate and regular rhythm.     Heart sounds: No murmur heard. Pulmonary:     Effort: Pulmonary effort is normal. No respiratory distress.     Breath sounds: Normal breath sounds.  Abdominal:     Palpations: Abdomen is soft.     Tenderness: There is no abdominal tenderness. There is no guarding or rebound.  Musculoskeletal:        General: No swelling.     Cervical back: Neck supple.  Skin:    General: Skin is warm and dry.     Capillary Refill: Capillary refill takes less than 2 seconds.  Neurological:     Mental Status: She is alert.  Psychiatric:        Mood and Affect: Mood normal.     ED Course/ Medical Decision Making/ A&P    Procedures Procedures    Medications Ordered in ED Medications  lactated ringers  bolus 1,000 mL (0 mLs Intravenous Stopped 04/30/24 1243)  ondansetron  (ZOFRAN ) injection 4 mg (4 mg Intravenous Given 04/30/24 1127)  lactated ringers  bolus 1,000 mL (0 mLs Intravenous Stopped 04/30/24 1424)    Medical Decision Making:   ELANE PEABODY is a 73 y.o. female who presents for low blood pressure in the setting of diarrhea as per above.  Physical exam is pertinent for no focal abnormality.   The differential includes but is not limited to dehydration, AKI, metabolic derangement, orthostatic hypotension.  Independent historian: None  External data reviewed: Labs: reviewed prior labs for baseline  Labs: Ordered, Independent interpretation, and Details: Initial lactic acid elevated at 2.7, delta 1.3.  CBC without leukocytosis, thrombocytopenia.  Similar anemia on comparison to prior.  CMP without AKI, no emergent electrolyte derangement, mild hypokalemia to 3.3.  No emergent LFT abnormality.  Radiology: Not indicated No results found.  EKG/Medicine tests: Not indicated EKG Interpretation: Sinus rhythm Confirmed by Rogelia Satterfield ([id number]) on 04/30/2024 11:37:17 AM                Interventions: Zofran , 2 L LR bolus, potassium repletion  See the EMR for full details regarding lab and imaging results.  Patient presents for low blood pressure in the setting of persistent nausea, vomiting, diarrhea, patient has not had any systemic symptoms, is well-appearing.  Do feel that patient requires screening labs given with normotension, though it was reportedly more severe at PCP than here in the emergency department.  Will give 1 L fluid bolus.  Patient initially vomiting on exam, therefore Zofran  administered.  Labs obtained, patient did initially have lactic acid elevation which is consistent with dehydration, therefore given second liter LR bolus.  Patient does not have leukocytosis, afebrile, no abdominal pain, therefore  doubt severe intra-abdominal process such as infectious or inflammatory condition, feel that lactic acid elevation is primarily due to dehydration.  Patient did have mild hypokalemia which was repleted orally.  Throughout her time in the ED, patient does not have any ongoing diarrhea, did consider stool  studies given patient reported that she had had several weeks of diarrhea, however given patient is unable to produce stool sample despite being in the emergency department for 6 hours, low index of suspicion for severe infectious diarrhea, do not feel that patient needs to be held in the ED solely to obtain this test.  On reevaluation, patient has low normotension, however overall vitally stable.  States that she does not have any abdominal pain, has not had any diarrhea since arrival to the ED, and has not had recurrent nausea or vomiting after getting Zofran .  Discussed labs including low potassium, initially elevated lactic acid that resolved after fluids.  Given patient is feeling well, labs reassuring, feel that patient is stable for discharge with outpatient follow-up, patient amenable to this plan.  Presentation is most consistent with acute uncomplicated illness  Discussion of management or test interpretations with external provider(s): Not indicated  Risk Drugs:Prescription drug management Disposition: DISCHARGE: I believe that the patient is safe for discharge home with outpatient follow-up. Patient was informed of all pertinent physical exam, laboratory, and imaging findings. Patient's suspected etiology of their symptom presentation was discussed with the patient and all questions were answered. We discussed following up with PCP. I provided thorough ED return precautions. The patient feels safe and comfortable with this plan.  MDM generated using voice dictation software and may contain dictation errors.  Please contact me for any clarification or with any questions.  Clinical  Impression:  1. Diarrhea, unspecified type      Discharge   Final Clinical Impression(s) / ED Diagnoses Final diagnoses:  Diarrhea, unspecified type    Rx / DC Orders ED Discharge Orders     None        Rogelia Jerilynn RAMAN, MD 05/01/24 1011

## 2024-04-30 NOTE — ED Notes (Signed)
 Pt states she needs IV fluids now so she can go to her husband who has dementia.

## 2024-04-30 NOTE — ED Triage Notes (Signed)
 Pt left the doctor and states BP is low. Denies CP and SHOB. C/O dizziness. Axox4. Pt states she has had diarrhea for 3 weeks, denies blood in stool.

## 2024-04-30 NOTE — Progress Notes (Addendum)
    SUBJECTIVE:   CHIEF COMPLAINT / HPI:   Discussed the use of AI scribe software for clinical note transcription with the patient, who gave verbal consent to proceed.  History of Present Illness Michelle [name] is a 73 year old female who presents with persistent diarrhea.  Chronic diarrhea and associated gastrointestinal symptoms - Persistent, brown, watery diarrhea for several weeks - Imodium  taken four times daily with minimal relief - Decreased appetite - occasional intermittent dizziness, especially when rising at night - Occasional nausea and mild abdominal discomfort - No vomiting, hematochezia, or fever - Cholestyramine  previously prescribed by gastroenterology but intolerable and not taken recently - No recent medication changes or travel - Next colonoscopy 2027 - Last saw GI in April     PERTINENT  PMH / PSH: Hypertension, diabetes, HFrEF, asthma, hyperlipidemia, chronic pain  OBJECTIVE:   BP (!) 77/61   Pulse 83   Temp (!) 97.3 F (36.3 C) (Oral)   Ht 5' 2 (1.575 m)   Wt 122 lb 2 oz (55.4 kg)   SpO2 93%   BMI 22.34 kg/m    General: NAD, pleasant, able to participate in exam Cardiac: RRR, no murmurs auscultated Respiratory: CTAB, normal WOB Abdomen: soft, non-tender, non-distended, normoactive bowel sounds Extremities: warm and well perfused, no edema or cyanosis Skin: Well-perfused.  Scattered bruises on her arms reportedly from her dog's paws Neuro: alert, no obvious focal deficits, speech normal Psych: Normal affect and mood  ASSESSMENT/PLAN:    Assessment & Plan Diarrhea, unspecified type Hypotension, unspecified hypotension type Persistent symptoms x3wks, minimal improvement with loperamide  Was previously on cholestyramine  but this was not tolerated However she is significantly hypotensive today, MAP is still above 65 but her systolic is 77.  She is not tachycardic but is also on a beta-blocker. In the setting of recent diarrhea concern for  acute dehydration.  Currently not having hypotensive symptoms including dizziness or syncope but she does report intermittent episodes of this recently.  Upon shared decision making decided to have her evaluated in the ED.  Anticipate she may need some IV fluids and if workup is otherwise unrevealing hopeful she may not need admission as she otherwise appears well and is stable.  However will defer this decision making to ED provider.  Sent in a refill of loperamide  which she can continue using Recommend GI follow-up soon for further discussion, patient to reach out and schedule  Patient transported herself to the ER   Payton Coward, MD [facility]

## 2024-04-30 NOTE — Patient Instructions (Signed)
 Please visit the ER   Please follow up with your GI doctor  Use imodium  (loperamide ) as needed

## 2024-04-30 NOTE — Discharge Instructions (Signed)
 Michelle [name]  Thank you for allowing us  to take care of you today.  You came to the Emergency Department today because you had diarrhea for 3 weeks and your blood pressure was low at your primary care doctor today.  Initially your acid number was high, so we gave you fluid, and now your labs are looking well.  You do not have any elevation of your kidney number.  Your potassium was a little bit low and we are giving you a potassium pill.  We recommend continuing to follow-up closely with your primary care doctor, and return to the emergency department for any new or worsening symptoms.  To-Do: 1. Please follow-up with your primary doctor within 1 - 2 weeks / as soon as possible.   Please return to the Emergency Department or call 911 if you experience have worsening of your symptoms, or do not get better, chest pain, shortness of breath, severe or significantly worsening pain, high fever, severe confusion, pass out or have any reason to think that you need emergency medical care.   We hope you feel better soon.   Mitzie Later, MD Department of Emergency Medicine [facility]

## 2024-05-03 ENCOUNTER — Other Ambulatory Visit: Payer: Self-pay | Admitting: Cardiology

## 2024-05-03 DIAGNOSIS — I5022 Chronic systolic (congestive) heart failure: Secondary | ICD-10-CM

## 2024-05-03 DIAGNOSIS — I428 Other cardiomyopathies: Secondary | ICD-10-CM

## 2024-05-04 ENCOUNTER — Telehealth: Payer: Self-pay | Admitting: Family Medicine

## 2024-05-04 ENCOUNTER — Ambulatory Visit: Attending: Cardiology | Admitting: Cardiology

## 2024-05-04 ENCOUNTER — Other Ambulatory Visit: Payer: Self-pay

## 2024-05-04 ENCOUNTER — Encounter: Payer: Self-pay | Admitting: Cardiology

## 2024-05-04 VITALS — BP 110/56 | HR 81 | Resp 16 | Ht 62.0 in | Wt 122.8 lb

## 2024-05-04 DIAGNOSIS — I5020 Unspecified systolic (congestive) heart failure: Secondary | ICD-10-CM | POA: Diagnosis not present

## 2024-05-04 DIAGNOSIS — E119 Type 2 diabetes mellitus without complications: Secondary | ICD-10-CM | POA: Diagnosis not present

## 2024-05-04 DIAGNOSIS — I700 Atherosclerosis of aorta: Secondary | ICD-10-CM

## 2024-05-04 DIAGNOSIS — E1169 Type 2 diabetes mellitus with other specified complication: Secondary | ICD-10-CM

## 2024-05-04 DIAGNOSIS — E785 Hyperlipidemia, unspecified: Secondary | ICD-10-CM

## 2024-05-04 DIAGNOSIS — I2510 Atherosclerotic heart disease of native coronary artery without angina pectoris: Secondary | ICD-10-CM

## 2024-05-04 DIAGNOSIS — I10 Essential (primary) hypertension: Secondary | ICD-10-CM

## 2024-05-04 DIAGNOSIS — M059 Rheumatoid arthritis with rheumatoid factor, unspecified: Secondary | ICD-10-CM

## 2024-05-04 DIAGNOSIS — G8929 Other chronic pain: Secondary | ICD-10-CM

## 2024-05-04 MED ORDER — OXYCODONE-ACETAMINOPHEN 10-325 MG PO TABS: 1.0000 | ORAL_TABLET | Freq: Four times a day (QID) | ORAL | 0 refills | Status: DC | PRN

## 2024-05-04 NOTE — Progress Notes (Signed)
 Cardiology Office Note:  .    ID:  Michelle [name], DOB 23-Jul-1950, MRN [id number] PCP:  [name], MD  Former Cardiology Providers: Dr. Gordy [facility] Providers Cardiologist:  Madonna Large, DO , [facility] (established care 07/22/2022) Electrophysiologist:  None  Click to update primary MD,subspecialty MD or APP then REFRESH:1}    Chief Complaint  Patient presents with   Follow-up    6 months follow-up for heart failure with improved EF    History of Present Illness: .   Michelle [name] is a 73 y.o. Caucasian female whose past medical history and cardiovascular risk factors includes: Hx of Nonischemic cardiomyopathy, HFimpEF, coronary artery calcification (CT scan care everywhere), aortic atherosclerosis, former smoker, non-insulin -dependent diabetes mellitus type 2, hyperlipidemia, hypertension.   Patient was referred to practice for evaluation and management of chronic HF. Her LVEF has improved from from 35-40% to 50-55% as of July 2025 after uptitration of GDMT.  Since establishing care patient has undergone left heart catheterization which did not illustrate any significant obstructive disease in the right heart catheterization noted compensated nonischemic cardiomyopathy.  With regards to her GDMT SGLT2 inhibitors were not considered given her history of multiple yeast infections in the past.  At the last office visit patient was endorsing precordial pain.  Given her recent angiography and mixed features shared decision was to proceed forward with echocardiogram.  If the echocardiogram was considered to be abnormal concerning for regional wall motion normalities further ischemic workup would be considered.  Echo from July 2025 noted preserved LVEF at 50-55%, grade 1 diastolic dysfunction, no significant valvular heart disease, estimated RAP 3 mmHg.  Since last office visit she had an episode where she went to the ED for chief complaint of diarrhea.  She presents today for  follow-up  Patient denies anginal chest pain or heart failure symptoms She has been struggling with diarrhea over the last 3 weeks causing dehydration requiring IV fluids, has been prescribed Lomotil which has provided some improvement in symptoms.  She also endorses normal appetite.  Overall functional capacity is stable but limited over the last 2 weeks due to diarrhea.  No near-syncope or syncopal events.  She follows up with PCP.  Review of Systems: .   Review of Systems  Constitutional: Positive for decreased appetite.  Cardiovascular:  Negative for chest pain, claudication, irregular heartbeat, leg swelling, near-syncope, orthopnea, palpitations, paroxysmal nocturnal dyspnea and syncope.  Respiratory:  Negative for shortness of breath.   Hematologic/Lymphatic: Negative for bleeding problem.  Gastrointestinal:  Positive for diarrhea.   Studies Reviewed:   EKG: EKG Interpretation Date/Time:  Tuesday May 04 2024 08:05:44 EST Text Interpretation: Normal sinus rhythm When compared with ECG of 30-Apr-2024 09:58, No significant change since last tracing Confirmed by Large Madonna [phone]) on 05/04/2024 8:12:22 AM  Echocardiogram: 07/17/2022: LVEF 35-40%, global hypokinesis, grade 1 diastolic impairment, mild MR, estimated RAP 3 mmHg.  November 2024: LVEF 40-45%, global hypokinesis, diastolic dysfunction indeterminate. Right ventricular systolic size and function normal. No significant valvular heart disease. Estimated RAP 3 mmHg    Heart Catheterization: 07/30/2022: LM: Nonexistent. Dual ostial present to LAD and Lcx LAD: Mild medial calcification. No significant disease Lcx: No significant disease RCA: No significant disease   LVEDP 12 mmHg RA: 2 mmHg RV: 19/1 mmHg PA: 20/8 mmHg, mPAP 13 mmHg PCW: 3 mmHg   CO: 4.9 L/min CI: 3.1 L/min/m2   No oxygen step up on shunt run   Well compensated nonischemic cardiomyopathy   ABI  12/04/2021 Right: Resting right ankle-brachial  index is within normal range. No evidence of significant right lower extremity arterial disease. The right toe-brachial index is normal.   Left: Resting left ankle-brachial index is within normal range. No evidence of significant left lower extremity arterial disease. The left toe-brachial index is normal.   RADIOLOGY: NA  Risk Assessment/Calculations:   NA   Labs:       Latest Ref Rng & Units 04/30/2024   11:08 AM 12/10/2023    9:14 AM 12/01/2023    1:57 PM  CBC  WBC 4.0 - 10.5 K/uL 8.0  8.2  8.7   Hemoglobin 12.0 - 15.0 g/dL 89.2  88.7  88.6   Hematocrit 36.0 - 46.0 % 32.9  34.0  35.5   Platelets 150 - 400 K/uL 292  263  276        Latest Ref Rng & Units 04/30/2024   12:37 PM 12/10/2023    9:14 AM 12/01/2023    1:57 PM  BMP  Glucose 70 - 99 mg/dL 898  872  886   BUN 8 - 23 mg/dL 10  14  16    Creatinine 0.44 - 1.00 mg/dL 9.43  9.46  9.31   BUN/Creat Ratio 12 - 28   24   Sodium 135 - 145 mmol/L 140  140  140   Potassium 3.5 - 5.1 mmol/L 3.3  4.6  4.2   Chloride 98 - 111 mmol/L 108  108  104   CO2 22 - 32 mmol/L 25  26  19    Calcium  8.9 - 10.3 mg/dL 8.3  9.1  9.0       Latest Ref Rng & Units 04/30/2024   12:37 PM 12/10/2023    9:14 AM 12/01/2023    1:57 PM  CMP  Glucose 70 - 99 mg/dL 898  872  886   BUN 8 - 23 mg/dL 10  14  16    Creatinine 0.44 - 1.00 mg/dL 9.43  9.46  9.31   Sodium 135 - 145 mmol/L 140  140  140   Potassium 3.5 - 5.1 mmol/L 3.3  4.6  4.2   Chloride 98 - 111 mmol/L 108  108  104   CO2 22 - 32 mmol/L 25  26  19    Calcium  8.9 - 10.3 mg/dL 8.3  9.1  9.0   Total Protein 6.5 - 8.1 g/dL 5.1  6.5  6.1   Total Bilirubin 0.0 - 1.2 mg/dL 0.7  0.6  0.4   Alkaline Phos 38 - 126 U/L 57  65  75   AST 15 - 41 U/L 20  17  22    ALT 0 - 44 U/L 12  15  11      Lab Results  Component Value Date   CHOL 117 08/26/2022   HDL 41 08/26/2022   LDLCALC 54 08/26/2022   LDLDIRECT 43 06/01/2020   TRIG 122 08/26/2022   CHOLHDL 2.9 08/26/2022   No results for input(s):  LIPOA in the last 8760 hours. No components found for: NTPROBNP No results for input(s): PROBNP in the last 8760 hours.  No results for input(s): TSH in the last 8760 hours.  Physical Exam:    Today's Vitals   05/04/24 0803  BP: (!) 110/56  Pulse: 81  Resp: 16  SpO2: 92%  Weight: 122 lb 12.8 oz (55.7 kg)  Height: 5' 2 (1.575 m)    Body mass index is 22.46 kg/m. Wt Readings from Last 3 Encounters:  05/04/24 122 lb 12.8 oz (55.7 kg)  04/30/24 122 lb (55.3 kg)  04/30/24 122 lb 2 oz (55.4 kg)    Physical Exam  Constitutional: No distress.  Age appropriate, hemodynamically stable.   HENT:  Poor oral hygiene   Neck: No JVD present.  Cardiovascular: Normal rate, regular rhythm, S1 normal, S2 normal, intact distal pulses and normal pulses. Exam reveals no gallop, no S3 and no S4.  No murmur heard. Pulmonary/Chest: Effort normal and breath sounds normal. No stridor. She has no wheezes. She has no rales.  Abdominal: Soft. Bowel sounds are normal. She exhibits no distension. There is no abdominal tenderness.  Musculoskeletal:        General: No edema.     Cervical back: Neck supple.  Neurological: She is alert and oriented to person, place, and time. She has intact cranial nerves (2-12).  Skin: Skin is warm and moist.     Impression & Recommendation(s):  Impression:   ICD-10-CM   1. Heart failure with improved ejection fraction (HFimpEF) (HCC)  I50.20 EKG 12-Lead    2. Coronary artery calcification seen on computed tomography  I25.10     3. Aortic atherosclerosis  I70.0     4. Non-insulin  dependent type 2 diabetes mellitus (HCC)  E11.9     5. Type 2 diabetes mellitus with hyperlipidemia (HCC)  E11.69    E78.5     6. Benign hypertension  I10         Recommendation(s):  Chronic HFimpEF (heart failure with improved ejection fraction) (HCC) Hx of Nonischemic cardiomyopathy (HCC) Stage B, NYHA class II. Discovered in January 2024. Most recent angiography  notes nonobstructive CAD with compensated nonischemic cardiomyopathy. Echo 07/17/2022: LVEF 35-40% Echo 05/2023 40-45% Echo 12/2023: 50-55% Continue Entresto  24/26 mg p.o. twice daily. Continue Toprol -XL 50 mg p.o. daily. Continue Crestor  10 mg p.o. daily Not on SGLT2 inhibitors given her history of multiple yeast infections. Given the recent diarrhea/dehydration recommended holding off Entresto  on days when she feels her blood pressures are soft or experiences lightheaded or dizziness. No changes in medication.  Coronary artery calcification seen on computed tomography Aortic atherosclerosis (HCC) Has undergone appropriate ischemic workup in Jan 2024.  Continue Crestor  See above   Non-insulin  dependent type 2 diabetes mellitus (HCC) Type 2 diabetes mellitus with hyperlipidemia (HCC) Reemphasized importance of glycemic control. Most recent hemoglobin A1c 5.6 as of September 2025, [facility] database.  LDL 54 mg/dL as of February 7975. Currently on Trulicity    Benign hypertension Office blood pressures are well-controlled. Medications reconciled. No additional changes warranted at this time Recommended to hold of Entresto  on prn basis especially if she is having a lot of diarrhea or feels dehydrated.   Orders Placed:  Orders Placed This Encounter  Procedures   EKG 12-Lead    Final Medication List:    No orders of the defined types were placed in this encounter.   Medications Discontinued During This Encounter  Medication Reason   leflunomide  (ARAVA ) 10 MG tablet Dose change      Current Outpatient Medications:    Accu-Chek Softclix Lancets lancets, Please use to check blood sugar up to three times per day. E11.42, Disp: 200 each, Rfl: 4   benzonatate  (TESSALON ) 200 MG capsule, Take 1 capsule (200 mg total) by mouth 3 (three) times daily as needed for cough., Disp: 30 capsule, Rfl: 0   betamethasone  dipropionate 0.05 % lotion, Apply topically daily., Disp: , Rfl:    Blood  Glucose Monitoring Suppl (ACCU-CHEK GUIDE ME)  w/Device KIT, USE AS DIRECTED TO  CHECK  BLOOD  SUGAR  UP  TO  3  TIMES  A  DAY, Disp: 1 kit, Rfl: 2   BREO ELLIPTA  200-25 MCG/ACT AEPB, Inhale 1 puff into the lungs daily., Disp: , Rfl:    cetirizine  (ZYRTEC ) 10 MG tablet, Take 10 mg by mouth daily., Disp: , Rfl:    cholestyramine  (QUESTRAN ) 4 g packet, Take 1 packet (4 g total) by mouth daily., Disp: 60 each, Rfl: 0   clobetasol  (TEMOVATE ) 0.05 % external solution, Apply 1 Application topically daily. qd up to 5 days a week to aa scalp prn psoriasis flares, avoid face, groin, axilla, Disp: 50 mL, Rfl: 5   cromolyn  (OPTICROM ) 4 % ophthalmic solution, Place 1 drop into both eyes 3 (three) times daily as needed (itchy eyes)., Disp: , Rfl:    Cyanocobalamin  (B-12) 5000 MCG CAPS, Take 5,000 mcg by mouth daily., Disp: , Rfl:    Dextromethorphan-guaiFENesin  (MUCINEX  DM MAXIMUM STRENGTH) 60-1200 MG TB12, Take 1 tablet by mouth 2 (two) times daily., Disp: 20 tablet, Rfl: 0   diphenhydramine -acetaminophen  (TYLENOL  PM) 25-500 MG TABS tablet, Take 2 tablets by mouth at bedtime as needed (sleep)., Disp: , Rfl:    Dulaglutide  (TRULICITY ) 3 MG/0.5ML SOAJ, Inject 3 mg as directed once a week., Disp: 6 mL, Rfl: 1   DULERA  100-5 MCG/ACT AERO, Inhale 2 puffs into the lungs 2 (two) times daily., Disp: , Rfl:    fluconazole  (DIFLUCAN ) 150 MG tablet, Take one dose by mouth today., Disp: 1 tablet, Rfl: 0   fluticasone  (FLONASE ) 50 MCG/ACT nasal spray, USE 2 SPRAY(S) IN EACH NOSTRIL ONCE DAILY AS NEEDED FOR ALLERGIES, Disp: 16 g, Rfl: 0   gabapentin  (NEURONTIN ) 300 MG capsule, TAKE 1 CAPSULE BY MOUTH EVERYDAY AT BEDTIME, Disp: 90 capsule, Rfl: 3   gentamicin  cream (GARAMYCIN ) 0.1 %, Apply to affected toe once daily., Disp: 30 g, Rfl: 1   glucose blood (ACCU-CHEK GUIDE TEST) test strip, Please use to check blood sugar up to three times per day. E11.42, Disp: 100 each, Rfl: 12   hydrOXYzine  (ATARAX ) 25 MG tablet, Take 25 mg by  mouth every 8 (eight) hours as needed., Disp: , Rfl:    Lancets Misc. (ACCU-CHEK SOFTCLIX LANCET DEV) KIT, Please use to check blood sugar up to three times per day. E11.42, Disp: 1 kit, Rfl: 0   leflunomide  (ARAVA ) 20 MG tablet, Take 20 mg by mouth daily., Disp: , Rfl:    loperamide  (IMODIUM ) 2 MG capsule, Take 1 capsule (2 mg total) by mouth as needed for diarrhea or loose stools., Disp: 30 capsule, Rfl: 0   metFORMIN  (GLUCOPHAGE -XR) 500 MG 24 hr tablet, TAKE 2 TABLETS BY MOUTH TWICE A DAY, Disp: 360 tablet, Rfl: 1   methylPREDNISolone  (MEDROL  DOSEPAK) 4 MG TBPK tablet, Take as directed, Disp: 21 tablet, Rfl: 0   metoprolol  succinate (TOPROL -XL) 50 MG 24 hr tablet, TAKE 1 TABLET BY MOUTH ONCE DAILY WITH MEALS OR  IMMEDIATELY  FOLLOWING  A  MEAL, Disp: 90 tablet, Rfl: 1   mirabegron  ER (MYRBETRIQ ) 25 MG TB24 tablet, Take 25 mg by mouth daily., Disp: , Rfl:    mometasone  (ELOCON ) 0.1 % cream, APPLY 1 APPLICATION TOPICALLY DAILY AS NEEDED (RASH)., Disp: 45 g, Rfl: 2   montelukast  (SINGULAIR ) 10 MG tablet, TAKE 1 TABLET BY MOUTH EVERY DAY (Patient taking differently: Take 10 mg by mouth at bedtime.), Disp: 30 tablet, Rfl: 0   mupirocin  ointment (BACTROBAN ) 2 %, Apply  topically daily. qd to open sores on arms, chest, Disp: 22 g, Rfl: 11   mupirocin  ointment (BACTROBAN ) 2 %, Apply 1 Application topically., Disp: , Rfl:    nitrofurantoin (MACRODANTIN) 100 MG capsule, Take 100 mg by mouth daily., Disp: , Rfl:    nitroGLYCERIN  (NITROSTAT ) 0.4 MG SL tablet, Place 1 tablet (0.4 mg total) under the tongue every 5 (five) minutes as needed., Disp: 30 tablet, Rfl: 0   rosuvastatin  (CRESTOR ) 10 MG tablet, Take 1 tablet (10 mg total) by mouth daily., Disp: 90 tablet, Rfl: 3   sacubitril-valsartan (ENTRESTO ) 24-26 MG, Take 1 tablet by mouth 2 (two) times daily., Disp: 180 tablet, Rfl: 2   sertraline  (ZOLOFT ) 50 MG tablet, Take 1 tablet (50 mg total) by mouth daily., Disp: 90 tablet, Rfl: 3   Thiamine HCl (VITAMIN  B-1) 250 MG tablet, Take 250 mg by mouth daily., Disp: , Rfl:    triamcinolone  cream (KENALOG ) 0.1 %, Apply 1 Application topically 2 (two) times daily., Disp: , Rfl:    VENTOLIN  HFA 108 (90 Base) MCG/ACT inhaler, INHALE 2 PUFFS BY MOUTH EVERY 4 HOURS AS NEEDED FOR WHEEZING OR SHORTNESS OF BREATH, Disp: 108 each, Rfl: 11   omeprazole  (PRILOSEC) 40 MG capsule, Take 1 capsule by mouth once daily in the morning, Disp: 90 capsule, Rfl: 0   oxyCODONE -acetaminophen  (PERCOCET) 10-325 MG tablet, Take 1 tablet by mouth every 6 (six) hours as needed for pain., Disp: 120 tablet, Rfl: 0  Consent:   N/A  Disposition:   12 month follow up  Patient may be asked to follow-up sooner based on the results of the above-mentioned testing.  Her questions and concerns were addressed to her satisfaction. She voices understanding of the recommendations provided during this encounter.    Signed, Madonna Michele HAS, [facility]  A Division of [facility] [address], [place], [place] [address]

## 2024-05-04 NOTE — Telephone Encounter (Signed)
 Sent request to provider. Nelson Land, CMA

## 2024-05-04 NOTE — Telephone Encounter (Signed)
 Patient request refill of:  Name of Medication(s):  Oxycodone  10/325 Last date of OV:  04/30/2024 Pharmacy:  [facility]  Will route refill request to Team CMA.  Discussed with patient policy to call pharmacy for future refills.  Also, discussed refills may take up to 48 hours to approve or deny.  Langlade A Warrick

## 2024-05-05 ENCOUNTER — Other Ambulatory Visit: Payer: Self-pay | Admitting: Family Medicine

## 2024-05-05 DIAGNOSIS — K219 Gastro-esophageal reflux disease without esophagitis: Secondary | ICD-10-CM

## 2024-05-09 ENCOUNTER — Encounter: Payer: Self-pay | Admitting: Cardiology

## 2024-05-11 ENCOUNTER — Other Ambulatory Visit: Payer: Self-pay | Admitting: Family Medicine

## 2024-05-11 ENCOUNTER — Other Ambulatory Visit: Payer: Self-pay | Admitting: Podiatry

## 2024-05-11 DIAGNOSIS — E1142 Type 2 diabetes mellitus with diabetic polyneuropathy: Secondary | ICD-10-CM

## 2024-05-11 DIAGNOSIS — E11621 Type 2 diabetes mellitus with foot ulcer: Secondary | ICD-10-CM

## 2024-05-12 ENCOUNTER — Telehealth: Payer: Self-pay | Admitting: Pharmacist

## 2024-05-12 ENCOUNTER — Other Ambulatory Visit: Payer: Self-pay

## 2024-05-12 DIAGNOSIS — R197 Diarrhea, unspecified: Secondary | ICD-10-CM

## 2024-05-12 NOTE — Telephone Encounter (Signed)
 Reviewed and agree with Dr Rennis plan.

## 2024-05-12 NOTE — Telephone Encounter (Signed)
 Patient contacted for appointment for QI measures with PCP.   Since last contact patient reports that she has a PCP visit scheduled on 06/09/2024  Patient needs following labs at next visit: Lipid panel and UACR  Total time with patient call and documentation of interaction: 5 minutes.

## 2024-05-13 MED ORDER — LOPERAMIDE HCL 2 MG PO CAPS: 2.0000 mg | ORAL_CAPSULE | ORAL | 0 refills | Status: AC | PRN

## 2024-05-18 ENCOUNTER — Other Ambulatory Visit: Payer: Self-pay | Admitting: Family Medicine

## 2024-05-18 NOTE — Progress Notes (Unsigned)
 05/19/2024 Michelle [name] [id number] Mar 05, 1951  Referring provider: [name], MD Primary GI doctor: Dr. San  ASSESSMENT AND PLAN:   Chronic diarrhea, worse x 4 weeks, decreased appetite, nocturnal symptoms, nausea, vomiting No fever, chills, sick contacts ER visit 04/30/2024 with hypokalemia No new medications, was on ABX in sept Culptrits: trulicity , oxycodone , metformin  Blood work 2022 celiac sprue serologies negative, sed rate, CRP, pancreatic elastase normal.  2022 colonoscopy negative microscopic colitis Possible infection, post infectious, rule out obstruction, infection ( Cdiff) less likely without stool today - check ESR, CRP, Cbc, CMET - check KUB to rule out constipation/obstruction with oxycodone  use, if no stool burden, suggest colestipol - check Cdiff if diarrhea returns with ABX in sept  GERD with EIM November 2022 EGD small HH normal esophagus nonspecific gastritis negative H. pylori, focal intestinal metaplasia negative esophageal biopsies On omeprazole  40 mg once daily Gastroparesis diet given  History of adenomatous colon polyp.   November 2022 colonoscopy normal TI diverticulosis left colon negative microscopic colitis Recall colonoscopy November 2027  Anemia with macrocytosis 04/30/2024  HGB 10.7 MCV 101.2 Platelets 292 12/10/2023 Iron  123 Ferritin 268 B12 319 Recent Labs    06/04/23 0836 12/01/23 1357 12/10/23 0914 04/30/24 1108  HGB 12.2 11.3 11.2* 10.7*  -Check iron , ferritin, B12  Back pain, right side Right-sided back pain, not associated with respiratory symptoms.  - Ordered chest x-ray to evaluate for potential causes of back pain. - Consider further imaging or labs if initial tests are inconclusive.  Patient Care Team: [name], MD as PCP - General (Family Medicine) Michele Richardson, DO as PCP - Cardiology (Cardiology) Patel, Donika K, DO as Consulting Physician (Neurology) Abigail Birmingham as Consulting Physician  (Optometry)  HISTORY OF PRESENT ILLNESS: 73 y.o. female with a past medical history listed below presents for evaluation of diarrhea.   Review of pertinent gastrointestinal problems: 1.  History of adenomatous colon polyp.   Colonoscopy October 2019 single subcentimeter adenoma was removed.  Diverticulosis was noted as well. November 2022 colonoscopy normal TI diverticulosis left colon negative microscopic colitis  2.  EGD October 2019 mild gastritis and small hiatal hernia.  Pathology was negative for H. Pylori. November 2022 EGD small HH normal esophagus nonspecific gastritis negative H. pylori, focal intestinal metaplasia negative esophageal biopsies 3.  Chronic diarrhea, many of her medicines probably play a role including metformin , Trulicity , arava .   Blood work 2022 celiac sprue serologies negative.  GI pathogen panel 2022 was positive for Campylobacter, treatment with a azithromycin  1 week did not help.  Repeat GI pathogen panel negative, pancreatic elastase was normal.  Sed rate normal.  CRP normal. 2022 colonoscopy negative microscopic colitis  Current HPI Discussed the use of AI scribe software for clinical note transcription with the patient, who gave verbal consent to proceed.  History of Present Illness   Michelle [name] is a 73 year old female who presents with persistent diarrhea and recent constipation.  She has been experiencing persistent diarrhea for approximately four weeks, with episodes occurring multiple times a day, including nocturnal episodes. Initially, she had up to eight episodes per day. During this period, she also experienced a lack of appetite. Recently, she has been unable to have a bowel movement despite straining, marking a change from her previous symptoms. No dark or bloody stools.  She was hospitalized for dehydration and received intravenous fluids. During her hospitalization, her blood pressure was noted to be very low, around 77 mmHg. Laboratory  tests revealed low  potassium levels and slightly decreased hemoglobin. She experienced nausea and vomiting during her hospital stay but has no current nausea or vomiting.  Her current medications include oxycodone , Trulicity , Mirabelle, and metformin , which was recently reduced from four to two tablets per day. She discontinued Questran  due to its unpleasant taste. She has been on a daily antibiotic for urinary tract infections and had an additional antibiotic in September for a toe infection. No recent new medications or antibiotics in the last six months aside from these.  She mentions back pain on the right side, which is not exacerbated by eating or relieved by bowel movements. No fever, chills, coughing, wheezing, or shortness of breath, although she had a dry throat earlier.  Socially, she lives approximately ten miles from the clinic and has a large dog. She mentions having arthritis and a history of multiple surgeries.        She  reports that she quit smoking about 29 years ago. Her smoking use included cigarettes. She started smoking about 34 years ago. She has a 7.5 pack-year smoking history. She has been exposed to tobacco smoke. She has never used smokeless tobacco. She reports that she does not drink alcohol and does not use drugs.  RELEVANT GI HISTORY, IMAGING AND LABS: Results   LABS Potassium: decreased (04/30/2024) Hemoglobin: decreased (04/30/2024)  PATHOLOGY Wound culture: infection present (September 2025)      CBC    Component Value Date/Time   WBC 8.0 04/30/2024 1108   RBC 3.25 (L) 04/30/2024 1108   HGB 10.7 (L) 04/30/2024 1108   HGB 11.2 (L) 12/10/2023 0914   HGB 11.3 12/01/2023 1357   HCT 32.9 (L) 04/30/2024 1108   HCT 35.5 12/01/2023 1357   PLT 292 04/30/2024 1108   PLT 263 12/10/2023 0914   PLT 276 12/01/2023 1357   MCV 101.2 (H) 04/30/2024 1108   MCV 103 (H) 12/01/2023 1357   MCH 32.9 04/30/2024 1108   MCHC 32.5 04/30/2024 1108   RDW 13.8 04/30/2024  1108   RDW 11.7 12/01/2023 1357   LYMPHSABS 2.1 04/30/2024 1108   LYMPHSABS 2.1 12/01/2023 1357   MONOABS 0.7 04/30/2024 1108   EOSABS 0.1 04/30/2024 1108   EOSABS 0.4 12/01/2023 1357   BASOSABS 0.0 04/30/2024 1108   BASOSABS 0.1 12/01/2023 1357   Recent Labs    06/04/23 0836 12/01/23 1357 12/10/23 0914 04/30/24 1108  HGB 12.2 11.3 11.2* 10.7*    CMP     Component Value Date/Time   NA 140 04/30/2024 1237   NA 140 12/01/2023 1357   K 3.3 (L) 04/30/2024 1237   CL 108 04/30/2024 1237   CO2 25 04/30/2024 1237   GLUCOSE 101 (H) 04/30/2024 1237   BUN 10 04/30/2024 1237   BUN 16 12/01/2023 1357   CREATININE 0.56 04/30/2024 1237   CREATININE 0.53 12/10/2023 0914   CREATININE 0.46 (L) 07/22/2016 1026   CALCIUM  8.3 (L) 04/30/2024 1237   PROT 5.1 (L) 04/30/2024 1237   PROT 6.1 12/01/2023 1357   ALBUMIN 3.0 (L) 04/30/2024 1237   ALBUMIN 4.3 12/01/2023 1357   AST 20 04/30/2024 1237   AST 17 12/10/2023 0914   ALT 12 04/30/2024 1237   ALT 15 12/10/2023 0914   ALKPHOS 57 04/30/2024 1237   BILITOT 0.7 04/30/2024 1237   BILITOT 0.6 12/10/2023 0914   GFRNONAA >60 04/30/2024 1237   GFRNONAA >60 12/10/2023 0914   GFRNONAA >89 07/22/2016 1026   GFRAA 114 02/16/2019 0941   GFRAA >89 07/22/2016 1026  Latest Ref Rng & Units 04/30/2024   12:37 PM 12/10/2023    9:14 AM 12/01/2023    1:57 PM  Hepatic Function  Total Protein 6.5 - 8.1 g/dL 5.1  6.5  6.1   Albumin 3.5 - 5.0 g/dL 3.0  4.1  4.3   AST 15 - 41 U/L 20  17  22    ALT 0 - 44 U/L 12  15  11    Alk Phosphatase 38 - 126 U/L 57  65  75   Total Bilirubin 0.0 - 1.2 mg/dL 0.7  0.6  0.4       Current Medications:   Current Outpatient Medications (Endocrine & Metabolic):    metFORMIN  (GLUCOPHAGE -XR) 500 MG 24 hr tablet, TAKE 2 TABLETS BY MOUTH TWICE A DAY   methylPREDNISolone  (MEDROL  DOSEPAK) 4 MG TBPK tablet, Take as directed   TRULICITY  3 MG/0.5ML SOAJ, INJECT 3 MG INTO THE SKIN AS DIRECTED ONCE A WEEK  Current Outpatient  Medications (Cardiovascular):    cholestyramine  (QUESTRAN ) 4 g packet, Take 1 packet (4 g total) by mouth daily.   metoprolol  succinate (TOPROL -XL) 50 MG 24 hr tablet, TAKE 1 TABLET BY MOUTH ONCE DAILY WITH MEALS OR  IMMEDIATELY  FOLLOWING  A  MEAL   nitroGLYCERIN  (NITROSTAT ) 0.4 MG SL tablet, Place 1 tablet (0.4 mg total) under the tongue every 5 (five) minutes as needed.   rosuvastatin  (CRESTOR ) 10 MG tablet, Take 1 tablet by mouth once daily   sacubitril-valsartan (ENTRESTO ) 24-26 MG, Take 1 tablet by mouth 2 (two) times daily.  Current Outpatient Medications (Respiratory):    benzonatate  (TESSALON ) 200 MG capsule, Take 1 capsule (200 mg total) by mouth 3 (three) times daily as needed for cough.   BREO ELLIPTA  200-25 MCG/ACT AEPB, Inhale 1 puff into the lungs daily.   cetirizine  (ZYRTEC ) 10 MG tablet, Take 10 mg by mouth daily.   Dextromethorphan-guaiFENesin  (MUCINEX  DM MAXIMUM STRENGTH) 60-1200 MG TB12, Take 1 tablet by mouth 2 (two) times daily.   DULERA  100-5 MCG/ACT AERO, Inhale 2 puffs into the lungs 2 (two) times daily.   fluticasone  (FLONASE ) 50 MCG/ACT nasal spray, USE 2 SPRAY(S) IN EACH NOSTRIL ONCE DAILY AS NEEDED FOR ALLERGIES   montelukast  (SINGULAIR ) 10 MG tablet, TAKE 1 TABLET BY MOUTH EVERY DAY (Patient taking differently: Take 10 mg by mouth at bedtime.)   VENTOLIN  HFA 108 (90 Base) MCG/ACT inhaler, INHALE 2 PUFFS BY MOUTH EVERY 4 HOURS AS NEEDED FOR WHEEZING OR SHORTNESS OF BREATH  Current Outpatient Medications (Analgesics):    leflunomide  (ARAVA ) 20 MG tablet, Take 20 mg by mouth daily.   oxyCODONE -acetaminophen  (PERCOCET) 10-325 MG tablet, Take 1 tablet by mouth every 6 (six) hours as needed for pain.  Current Outpatient Medications (Hematological):    Cyanocobalamin  (B-12) 5000 MCG CAPS, Take 5,000 mcg by mouth daily.  Current Outpatient Medications (Other):    Accu-Chek Softclix Lancets lancets, Please use to check blood sugar up to three times per day. E11.42    betamethasone  dipropionate 0.05 % lotion, Apply topically daily.   Blood Glucose Monitoring Suppl (ACCU-CHEK GUIDE ME) w/Device KIT, USE AS DIRECTED TO  CHECK  BLOOD  SUGAR  UP  TO  3  TIMES  A  DAY   clobetasol  (TEMOVATE ) 0.05 % external solution, Apply 1 Application topically daily. qd up to 5 days a week to aa scalp prn psoriasis flares, avoid face, groin, axilla   cromolyn  (OPTICROM ) 4 % ophthalmic solution, Place 1 drop into both eyes 3 (three) times daily  as needed (itchy eyes).   diphenhydramine -acetaminophen  (TYLENOL  PM) 25-500 MG TABS tablet, Take 2 tablets by mouth at bedtime as needed (sleep).   fluconazole  (DIFLUCAN ) 150 MG tablet, Take one dose by mouth today.   gabapentin  (NEURONTIN ) 300 MG capsule, TAKE 1 CAPSULE BY MOUTH EVERYDAY AT BEDTIME   gentamicin  cream (GARAMYCIN ) 0.1 %, APPLY  CREAM TOPICALLY TO AFFECTED AREA ONCE DAILY TO  TOE   glucose blood (ACCU-CHEK GUIDE TEST) test strip, Please use to check blood sugar up to three times per day. E11.42   hydrOXYzine  (ATARAX ) 25 MG tablet, Take 25 mg by mouth every 8 (eight) hours as needed.   Lancets Misc. (ACCU-CHEK SOFTCLIX LANCET DEV) KIT, Please use to check blood sugar up to three times per day. E11.42   loperamide  (IMODIUM ) 2 MG capsule, Take 1 capsule (2 mg total) by mouth as needed for diarrhea or loose stools.   mirabegron  ER (MYRBETRIQ ) 25 MG TB24 tablet, Take 25 mg by mouth daily.   mometasone  (ELOCON ) 0.1 % cream, APPLY 1 APPLICATION TOPICALLY DAILY AS NEEDED (RASH).   mupirocin  ointment (BACTROBAN ) 2 %, Apply topically daily. qd to open sores on arms, chest   mupirocin  ointment (BACTROBAN ) 2 %, Apply 1 Application topically.   nitrofurantoin (MACRODANTIN) 100 MG capsule, Take 100 mg by mouth daily.   omeprazole  (PRILOSEC) 40 MG capsule, Take 1 capsule by mouth once daily in the morning   sertraline  (ZOLOFT ) 50 MG tablet, Take 1 tablet (50 mg total) by mouth daily.   Thiamine HCl (VITAMIN B-1) 250 MG tablet, Take 250 mg  by mouth daily.   triamcinolone  cream (KENALOG ) 0.1 %, Apply 1 Application topically 2 (two) times daily.  Medical History:  Past Medical History:  Diagnosis Date   Actinic keratosis    Allergic rhinitis 08/28/2006   Anemia    low iron    Anginal pain    Asthma, chronic 03/14/2010   Does seem to use her albuterol  excessively. Consider change to more manageable medicines and treat more like COPD in the future.   Carpal tunnel syndrome, left 05/29/2009   Last Assessment & Plan:  Advised to wear wrist brace at night when she is sleeping.  If this does not help will consider having her go to sports medicine.   CHF (congestive heart failure) (HCC) 07/2022   HFrEF, NYHA Class I   Chronic frontal sinusitis 08/28/2016   Chronic left maxillary sinusitis 08/28/2016   Diverticulosis    Essential hypertension 10/30/2012   Gastroparesis 11/19/2011   DM and chronic narcotics contribute.  Causes dysphagia like symptoms    Generalized anxiety disorder 08/28/2006   GERD (gastroesophageal reflux disease)    Hearing loss 07/19/2015   Hearing aid in right ear   History of cataracts, bilateral    Hyperlipidemia 08/28/2006   Lumbar radiculopathy 04/07/2018   Lung nodules    Nonischemic cardiomyopathy (HCC)    Osteoporosis 08/08/2011   Pain in joint, pelvic region and thigh 11/18/2012   Radial styloid tenosynovitis 08/15/2013   Restless leg syndrome 05/14/2012   Seropositive rheumatoid arthritis 01/08/2010   Patient is being followed by rheumatologist at [facility]  patient is on Plaquenil and as well as prednisone .   Changed to methotrexate and prednisone  in May of 2013  Patient does have a pain contract here with [facility] receives oxycodone  10/325 mg tabs every 6 hours. This has been titrated down from significant doses of methadone previously. Could attempt to [facility]   Shoulder joint pain  03/25/2011   The patient is compensating due to her not being able to use her  left arm right now rotator cuff impingement if he continues to worsen would not be surprised if she does get a tear on this side as well   Squamous cell carcinoma in situ of skin 11/25/2014   Referred to dermatology for excision    Squamous cell carcinoma of skin 12/26/2014   Right distal pretibial. SCC-KA pattern.   Squamous cell carcinoma of skin 03/27/2015   Right inf. lat. knee. SCC-KA pattern.    Squamous cell carcinoma of skin 10/07/2016   Left lateral calf superior. WD SCC.   Squamous cell carcinoma of skin 10/07/2016   Left lateral calf inferior. WD SCC.   Squamous cell carcinoma of skin 04/28/2018   Left mid med. pretibial. WD SCC.   Squamous cell carcinoma of skin 08/12/2019   Left mid med. pretibial sup. KA type. EDC   Squamous cell carcinoma of skin 08/12/2019   Left mid med. pretibial inferior. WD. EDC.   Tear of medial meniscus of knee, left 04/27/2009   Type II diabetes mellitus 08/28/2006   Overview:  Patient has her ups and downs, go in line with flares of her RA and prednisone  uses.  Patient does have hx of hypoglycemic events so would not try for perfect control but should have A1c goal around 7.0 Lab Results  Component Value Date   HGBA1C 6.9 12/27/2011   Last Assessment & Plan:  At goal. Will continue current metformin    Allergies:  Allergies  Allergen Reactions   Zofran  [Ondansetron  Hcl] Other (See Comments)    Prolong QT   Ace Inhibitors Cough    Occurred with lisinopril   Levofloxacin  Other (See Comments)    Felt things on her legs that were not there,made her stomach hurt   Lisinopril Cough   Sulfonamide Derivatives Hives and Itching   Sulfa Antibiotics Hives and Itching     Surgical History:  She  has a past surgical history that includes Appendectomy; Cholecystectomy; Abdominal hysterectomy; Carpal tunnel release (Right); Knee surgery (Right); Hernia repair; Shoulder surgery (Left); right foot bone spurs removed; left and right heart catheterization with  coronary angiogram (N/A, 08/17/2013); Cataract extraction (Left); Colonoscopy; Upper gastrointestinal endoscopy; Rotator cuff repair (Right); lumbar back surgery (04/04/2018); Colonoscopy with propofol  (N/A, 05/10/2021); Esophagogastroduodenoscopy (egd) with propofol  (N/A, 05/10/2021); biopsy (05/10/2021); Back surgery; RIGHT/LEFT HEART CATH AND CORONARY ANGIOGRAPHY (N/A, 07/30/2022); Knee arthroscopy with medial menisectomy (Right, 03/26/2023); and Synovectomy (Right, 03/26/2023). Family History:  Her family history includes Bell's palsy in her maternal grandmother; COPD in her sister; Cancer in her sister; Colon cancer in her daughter and maternal aunt; Diabetes in her daughter; Healthy in her daughter; Hypertension in her daughter; Lung cancer (age of onset: 66) in her brother; Rheum arthritis (age of onset: 64) in her sister; Stroke (age of onset: 54) in her father; Tremor in her mother.  REVIEW OF SYSTEMS  : All other systems reviewed and negative except where noted in the History of Present Illness.  PHYSICAL EXAM: BP (!) 96/58 (BP Location: Left Arm, Patient Position: Sitting, Cuff Size: Normal)   Pulse 94   Ht 5' 2 (1.575 m)   Wt 118 lb 4 oz (53.6 kg)   BMI 21.63 kg/m  Physical Exam   GENERAL APPEARANCE: Well nourished, in no apparent distress HEENT: No cervical lymphadenopathy, unremarkable thyroid , sclerae anicteric, conjunctiva pink RESPIRATORY: Respiratory effort normal, breath sounds equal bilaterally without rales, rhonchi, wheezing, lungs clear to  auscultation bilaterally CARDIO: RRR with no MRGs, peripheral pulses intact ABDOMEN: Soft, non-distended, active bowel sounds in all 4 quadrants, no tenderness to palpation, no rebound, no mass appreciated RECTAL: Declines MUSCULOSKELETAL: Full ROM, normal gait, without edema SKIN: Dry, intact without rashes or lesions. No jaundice. NEURO: Alert, oriented, no focal deficits PSYCH: Cooperative, normal mood and affect.      Michelle JONELLE Coombs, PA-C 4:03 PM

## 2024-05-19 ENCOUNTER — Other Ambulatory Visit (INDEPENDENT_AMBULATORY_CARE_PROVIDER_SITE_OTHER)

## 2024-05-19 ENCOUNTER — Encounter: Payer: Self-pay | Admitting: Physician Assistant

## 2024-05-19 ENCOUNTER — Ambulatory Visit (INDEPENDENT_AMBULATORY_CARE_PROVIDER_SITE_OTHER)
Admission: RE | Admit: 2024-05-19 | Discharge: 2024-05-19 | Disposition: A | Discharge status: Home / Self Care | Source: Ambulatory Visit | Attending: Physician Assistant | Admitting: Physician Assistant

## 2024-05-19 ENCOUNTER — Ambulatory Visit: Admitting: Physician Assistant

## 2024-05-19 VITALS — BP 96/58 | HR 94 | Ht 62.0 in | Wt 118.2 lb

## 2024-05-19 DIAGNOSIS — E538 Deficiency of other specified B group vitamins: Secondary | ICD-10-CM | POA: Diagnosis not present

## 2024-05-19 DIAGNOSIS — K219 Gastro-esophageal reflux disease without esophagitis: Secondary | ICD-10-CM | POA: Diagnosis not present

## 2024-05-19 DIAGNOSIS — K3184 Gastroparesis: Secondary | ICD-10-CM

## 2024-05-19 DIAGNOSIS — D509 Iron deficiency anemia, unspecified: Secondary | ICD-10-CM

## 2024-05-19 DIAGNOSIS — I5020 Unspecified systolic (congestive) heart failure: Secondary | ICD-10-CM

## 2024-05-19 DIAGNOSIS — J984 Other disorders of lung: Secondary | ICD-10-CM

## 2024-05-19 DIAGNOSIS — K580 Irritable bowel syndrome with diarrhea: Secondary | ICD-10-CM

## 2024-05-19 DIAGNOSIS — E1142 Type 2 diabetes mellitus with diabetic polyneuropathy: Secondary | ICD-10-CM

## 2024-05-19 LAB — CBC WITH DIFFERENTIAL/PLATELET
Basophils Absolute: 0.1 K/uL (ref 0.0–0.1)
Basophils Relative: 1.2 % (ref 0.0–3.0)
Eosinophils Absolute: 0.1 K/uL (ref 0.0–0.7)
Eosinophils Relative: 1.4 % (ref 0.0–5.0)
HCT: 32.6 % — ABNORMAL LOW (ref 36.0–46.0)
Hemoglobin: 10.6 g/dL — ABNORMAL LOW (ref 12.0–15.0)
Lymphocytes Relative: 22.9 % (ref 12.0–46.0)
Lymphs Abs: 1.9 K/uL (ref 0.7–4.0)
MCHC: 32.4 g/dL (ref 30.0–36.0)
MCV: 100.3 fl — ABNORMAL HIGH (ref 78.0–100.0)
Monocytes Absolute: 0.7 K/uL (ref 0.1–1.0)
Monocytes Relative: 8.4 % (ref 3.0–12.0)
Neutro Abs: 5.5 K/uL (ref 1.4–7.7)
Neutrophils Relative %: 66.1 % (ref 43.0–77.0)
Platelets: 312 K/uL (ref 150.0–400.0)
RBC: 3.25 Mil/uL — ABNORMAL LOW (ref 3.87–5.11)
RDW: 13.9 % (ref 11.5–15.5)
WBC: 8.3 K/uL (ref 4.0–10.5)

## 2024-05-19 LAB — COMPREHENSIVE METABOLIC PANEL WITH GFR
ALT: 10 U/L (ref 0–35)
AST: 13 U/L (ref 0–37)
Albumin: 3.9 g/dL (ref 3.5–5.2)
Alkaline Phosphatase: 82 U/L (ref 39–117)
BUN: 16 mg/dL (ref 6–23)
CO2: 27 meq/L (ref 19–32)
Calcium: 9 mg/dL (ref 8.4–10.5)
Chloride: 105 meq/L (ref 96–112)
Creatinine, Ser: 0.54 mg/dL (ref 0.40–1.20)
GFR: 90.89 mL/min (ref >60.00)
Glucose, Bld: 98 mg/dL (ref 70–99)
Potassium: 4.7 meq/L (ref 3.5–5.1)
Sodium: 138 meq/L (ref 135–145)
Total Bilirubin: 0.5 mg/dL (ref 0.2–1.2)
Total Protein: 6.4 g/dL (ref 6.0–8.3)

## 2024-05-19 LAB — IBC + FERRITIN
Ferritin: 134.9 ng/mL (ref 10.0–291.0)
Iron: 62 ug/dL (ref 42–145)
Saturation Ratios: 17.7 % — ABNORMAL LOW (ref 20.0–50.0)
TIBC: 350 ug/dL (ref 250.0–450.0)
Transferrin: 250 mg/dL (ref 212.0–360.0)

## 2024-05-19 LAB — VITAMIN B12: Vitamin B-12: >1500 pg/mL — ABNORMAL HIGH (ref 211–911)

## 2024-05-19 LAB — TSH: TSH: 1.26 u[IU]/mL (ref 0.35–5.50)

## 2024-05-19 LAB — C-REACTIVE PROTEIN: CRP: <0.5 mg/dL (ref 0.5–20.0)

## 2024-05-19 LAB — SEDIMENTATION RATE: Sed Rate: 7 mm/h (ref 0–30)

## 2024-05-19 LAB — LIPASE: Lipase: 42 U/L (ref 11.0–59.0)

## 2024-05-19 NOTE — Patient Instructions (Addendum)
 Your provider has requested that you go to the basement level for lab work before leaving today. Press B on the elevator. The lab is located at the first door on the left as you exit the elevator.  Your provider has requested that you have an abdominal x ray before leaving today. Please go to the basement floor to our Radiology department for the test.  You may have POST INFECTIOUS IBS OR IRRITABLE BOWEL After an infection or diverticulitis flare your intestines can spasm or be a little bit more sensitive. Try these things below:  Take the restora once a day Can do BRAT diet versus low FODMAP- see below Try trial off milk/lactose products.  Add fiber like benefiber or citracel once a day Can do trial of IBGard for AB pain EVERY DAY- Take 1-2 capsules once a day for maintence or twice a day during a flare if any worsening symptoms like blood in stool, weight loss, please call the office or go to the ER.    FODMAP stands for fermentable oligo-, di-, mono-saccharides and polyols (1). These are the scientific terms used to classify groups of carbs that are notorious for triggering digestive symptoms like bloating, gas and stomach pain.

## 2024-05-20 ENCOUNTER — Telehealth: Payer: Self-pay | Admitting: Physician Assistant

## 2024-05-20 ENCOUNTER — Ambulatory Visit: Payer: Self-pay | Admitting: Physician Assistant

## 2024-05-20 NOTE — Telephone Encounter (Signed)
 Pt. Is having more numbness in hands,feet and legs and would like to know what she can do to assist until next appt, Pt on waiting list

## 2024-05-21 ENCOUNTER — Telehealth: Payer: Self-pay | Admitting: Physician Assistant

## 2024-05-21 NOTE — Telephone Encounter (Signed)
 I advised patient to contact Neurosurgeon regarding this concerns. She thanked me for calling her.

## 2024-05-21 NOTE — Telephone Encounter (Signed)
 Inbound call from patient stating that she was advised to call our office to inform the nurse when she starts having Diarrhea again. Patient stated that her diarrhea start yesterday late evening going into today. Patient is requesting a call back. Please advise.

## 2024-05-24 NOTE — Telephone Encounter (Signed)
 Called and spoke with patient regarding results and recommendations. Patient is driving right now and not able to take notes for bowel purge. I told patient that I would call her back and leave detailed instructions for bowel purge on her vm. Patient verbalized understanding.   Called and left detailed vm with x-ray results again and detailed bowel purge instructions. Advised patient to continue Miralax  daily after purge, informed her that she can take 1-3 doses daily of Miralax . Advised to start with 1/2-1 capful daily for a few days after purge and then increase PRN. Advised patient to let us  know if she does not have any success and we can give her samples of Linzess. Advised patient to call back with any questions.

## 2024-05-24 NOTE — Telephone Encounter (Signed)
 Abdominal KUB had not resulted. Just called radiology reading room to have this read ASAP. Please advise on results when available and next steps. If abd x-ray is normal are we proceeding with Diatherix C. Diff as previously noted?

## 2024-05-26 ENCOUNTER — Telehealth: Payer: Self-pay | Admitting: Physician Assistant

## 2024-05-26 NOTE — Telephone Encounter (Signed)
 Patient returning call Requesting a call back  Please advise  Thank you

## 2024-05-26 NOTE — Telephone Encounter (Signed)
 See result note from 05/19/24.

## 2024-05-26 NOTE — Progress Notes (Signed)
 Michelle [name]                                          MRN: [id number]   05/26/2024   The [facility] Quality Team Specialist reviewed this patient medical record for the purposes of chart review for care gap closure. The following were reviewed: chart review for care gap closure-kidney health evaluation for diabetes:eGFR  and uACR.    [facility] Quality Team

## 2024-05-26 NOTE — Progress Notes (Signed)
 Michelle [name]                                          MRN: [id number]   05/26/2024   The [facility] Quality Team Specialist reviewed this patient medical record for the purposes of chart review for care gap closure. The following were reviewed: abstraction for care gap closure-glycemic status assessment.    [facility] Quality Team

## 2024-05-31 ENCOUNTER — Telehealth: Payer: Self-pay | Admitting: Physician Assistant

## 2024-05-31 ENCOUNTER — Other Ambulatory Visit: Payer: Self-pay

## 2024-05-31 DIAGNOSIS — G8929 Other chronic pain: Secondary | ICD-10-CM

## 2024-05-31 DIAGNOSIS — M059 Rheumatoid arthritis with rheumatoid factor, unspecified: Secondary | ICD-10-CM

## 2024-05-31 NOTE — Telephone Encounter (Signed)
 Inbound call from patient stating that she is requesting for a nurse to please return her call in regards to a Gatorade miralax  solution she was put on. Patient stated nothing has changed and she has lost 3 lbs already. Patient is requesting a call back to her mobile number. Please advise.

## 2024-05-31 NOTE — Telephone Encounter (Signed)
 Spoke with patient. Patient states after trying the bowel purge, 3 days later she began vomiting. Patient states she has lost 3 lbs & is having a hard time eating. Patient has passed some stool which has varied from loose to solid. Patient has not been able to pass any gas. Patient advised to go to the ER for expedited evaluation. Patient states she will not be able to go until tomorrow.

## 2024-06-01 ENCOUNTER — Emergency Department (HOSPITAL_COMMUNITY)

## 2024-06-01 ENCOUNTER — Emergency Department (HOSPITAL_COMMUNITY)
Admission: EM | Admit: 2024-06-01 | Discharge: 2024-06-01 | Disposition: A | Discharge status: Home / Self Care | Source: Ambulatory Visit

## 2024-06-01 ENCOUNTER — Other Ambulatory Visit: Payer: Self-pay

## 2024-06-01 DIAGNOSIS — Z79899 Other long term (current) drug therapy: Secondary | ICD-10-CM | POA: Diagnosis not present

## 2024-06-01 DIAGNOSIS — K591 Functional diarrhea: Secondary | ICD-10-CM | POA: Insufficient documentation

## 2024-06-01 DIAGNOSIS — G8929 Other chronic pain: Secondary | ICD-10-CM

## 2024-06-01 DIAGNOSIS — M059 Rheumatoid arthritis with rheumatoid factor, unspecified: Secondary | ICD-10-CM

## 2024-06-01 LAB — CBC
HCT: 33.5 % — ABNORMAL LOW (ref 36.0–46.0)
Hemoglobin: 11 g/dL — ABNORMAL LOW (ref 12.0–15.0)
MCH: 33.1 pg (ref 26.0–34.0)
MCHC: 32.8 g/dL (ref 30.0–36.0)
MCV: 100.9 fL — ABNORMAL HIGH (ref 80.0–100.0)
Platelets: 286 K/uL (ref 150–400)
RBC: 3.32 MIL/uL — ABNORMAL LOW (ref 3.87–5.11)
RDW: 13.1 % (ref 11.5–15.5)
WBC: 8.3 K/uL (ref 4.0–10.5)
nRBC: 0 % (ref 0.0–0.2)

## 2024-06-01 LAB — COMPREHENSIVE METABOLIC PANEL WITH GFR
ALT: 13 U/L (ref 0–44)
AST: 16 U/L (ref 15–41)
Albumin: 3.4 g/dL — ABNORMAL LOW (ref 3.5–5.0)
Alkaline Phosphatase: 74 U/L (ref 38–126)
Anion gap: 9 (ref 5–15)
BUN: 21 mg/dL (ref 8–23)
CO2: 25 mmol/L (ref 22–32)
Calcium: 9.2 mg/dL (ref 8.9–10.3)
Chloride: 104 mmol/L (ref 98–111)
Creatinine, Ser: 0.61 mg/dL (ref 0.44–1.00)
GFR, Estimated: >60 mL/min (ref >60)
Glucose, Bld: 136 mg/dL — ABNORMAL HIGH (ref 70–99)
Potassium: 3.6 mmol/L (ref 3.5–5.1)
Sodium: 138 mmol/L (ref 135–145)
Total Bilirubin: 0.6 mg/dL (ref 0.0–1.2)
Total Protein: 6 g/dL — ABNORMAL LOW (ref 6.5–8.1)

## 2024-06-01 LAB — LIPASE, BLOOD: Lipase: 50 U/L (ref 11–51)

## 2024-06-01 LAB — URINALYSIS, ROUTINE W REFLEX MICROSCOPIC
Bilirubin Urine: NEGATIVE
Glucose, UA: NEGATIVE mg/dL
Hgb urine dipstick: NEGATIVE
Ketones, ur: NEGATIVE mg/dL
Nitrite: NEGATIVE
Protein, ur: NEGATIVE mg/dL
Specific Gravity, Urine: 1.019 (ref 1.005–1.030)
pH: 5 (ref 5.0–8.0)

## 2024-06-01 LAB — TSH: TSH: 3.615 u[IU]/mL (ref 0.350–4.500)

## 2024-06-01 MED ORDER — IOHEXOL 350 MG/ML SOLN
75.0000 mL | Freq: Once | INTRAVENOUS | Status: AC | PRN
  Administered 2024-06-01: 75 mL via INTRAVENOUS

## 2024-06-01 MED ORDER — SODIUM CHLORIDE 0.9 % IV BOLUS
500.0000 mL | Freq: Once | INTRAVENOUS | Status: AC
  Administered 2024-06-01: 500 mL via INTRAVENOUS

## 2024-06-01 MED ORDER — OXYCODONE-ACETAMINOPHEN 10-325 MG PO TABS: 1.0000 | ORAL_TABLET | Freq: Four times a day (QID) | ORAL | 0 refills | Status: DC | PRN

## 2024-06-01 NOTE — ED Triage Notes (Addendum)
 Pt. Stated, Michelle [name] been constipated and had diarrhea. I went to my Dr.2 weeks ago, told to take Miralax  and other stuff and Im still I think constipated and they said I may be impacted. I called and they said to come here. This started 5 weeks ago.

## 2024-06-01 NOTE — ED Provider Notes (Signed)
 [facility] Provider Note   CSN: [id number] Arrival date & time: 06/01/24  9263     Patient presents with: Constipation, Fecal Impaction, Abdominal Pain, Emesis, and Nausea   Michelle [name] is a 73 y.o. female.    Constipation Associated symptoms: abdominal pain and vomiting   Abdominal Pain Associated symptoms: constipation and vomiting   Emesis Associated symptoms: abdominal pain    Patient presents because of abdominal pain.  Patient states that she had episode of vomiting around Thanksgiving.  Patient states that she thinks is because she is taking MiraLAX  as well as Gatorade.  Patient states that generalized abdominal pain.  Mostly lower abdomen.  No previous surgeries.  Not really past much flatulence but did have 2 regular bowel movements yesterday.  Patient states that she has been having some episodes of diarrhea and constipation.  Seems to be off-and-on again.  No fever no chills.  No chest pain or shortness of breath.  No nausea or diarrhea.  No night sweats.  Is endorsing weight loss but she states that she is not hungry and not really eating much.  No lymphadenopathy that she can appreciate.   Previous medical history reviewed : Patient was seen in the ED on October 31.  Diarrhea.  Negative workup at that time.  Followed up with gastroenterology on May 19, 2024.  Chronic diarrhea.     Prior to Admission medications   Medication Sig Start Date End Date Taking? Authorizing Provider  Accu-Chek Softclix Lancets lancets Please use to check blood sugar up to three times per day. E11.42 01/09/24   [name], MD  benzonatate  (TESSALON ) 200 MG capsule Take 1 capsule (200 mg total) by mouth 3 (three) times daily as needed for cough. 02/15/24   Iola Lukes, FNP  betamethasone  dipropionate 0.05 % lotion Apply topically daily. 06/15/20   [provider]  Blood Glucose Monitoring Suppl (ACCU-CHEK GUIDE ME) w/Device  KIT USE AS DIRECTED TO  CHECK  BLOOD  SUGAR  UP  TO  3  TIMES  A  DAY 04/09/24   Nicholas Bar, MD  BREO ELLIPTA  200-25 MCG/ACT AEPB Inhale 1 puff into the lungs daily.    [provider]  cetirizine  (ZYRTEC ) 10 MG tablet Take 10 mg by mouth daily.    [provider]  cholestyramine  (QUESTRAN ) 4 g packet Take 1 packet (4 g total) by mouth daily. 06/27/23   Christia Budds, MD  clobetasol  (TEMOVATE ) 0.05 % external solution Apply 1 Application topically daily. qd up to 5 days a week to aa scalp prn psoriasis flares, avoid face, groin, axilla 09/30/23   Hester Alm BROCKS, MD  cromolyn  (OPTICROM ) 4 % ophthalmic solution Place 1 drop into both eyes 3 (three) times daily as needed (itchy eyes). 05/05/20   [provider]  Cyanocobalamin  (B-12) 5000 MCG CAPS Take 5,000 mcg by mouth daily.    [provider]  Dextromethorphan-guaiFENesin  (MUCINEX  DM MAXIMUM STRENGTH) 60-1200 MG TB12 Take 1 tablet by mouth 2 (two) times daily. 02/15/24   Murrill, Samantha, FNP  diphenhydramine -acetaminophen  (TYLENOL  PM) 25-500 MG TABS tablet Take 2 tablets by mouth at bedtime as needed (sleep).    [provider]  DULERA  100-5 MCG/ACT AERO Inhale 2 puffs into the lungs 2 (two) times daily. 02/26/23   [provider]  fluconazole  (DIFLUCAN ) 150 MG tablet Take one dose by mouth today. 03/23/24   Gaynel Delon CROME, DPM  fluticasone  (FLONASE ) 50 MCG/ACT nasal spray USE  2 SPRAY(S) IN EACH NOSTRIL ONCE DAILY AS NEEDED FOR ALLERGIES 03/09/24   [name], MD  gabapentin  (NEURONTIN ) 300 MG capsule TAKE 1 CAPSULE BY MOUTH EVERYDAY AT BEDTIME 08/22/23   [name], MD  gentamicin  cream (GARAMYCIN ) 0.1 % APPLY  CREAM TOPICALLY TO AFFECTED AREA ONCE DAILY TO  TOE 05/11/24   Gaynel Delon CROME, DPM  glucose blood (ACCU-CHEK GUIDE TEST) test strip Please use to check blood sugar up to three times per day. E11.42 01/09/24   [name], MD  hydrOXYzine  (ATARAX ) 25 MG tablet Take 25 mg  by mouth every 8 (eight) hours as needed.    [provider]  Lancets Misc. (ACCU-CHEK SOFTCLIX LANCET DEV) KIT Please use to check blood sugar up to three times per day. E11.42 01/09/24   [name], MD  leflunomide  (ARAVA ) 20 MG tablet Take 20 mg by mouth daily.    [provider]  loperamide  (IMODIUM ) 2 MG capsule Take 1 capsule (2 mg total) by mouth as needed for diarrhea or loose stools. 05/13/24   [name], MD  metFORMIN  (GLUCOPHAGE -XR) 500 MG 24 hr tablet TAKE 2 TABLETS BY MOUTH TWICE A DAY 01/05/24   [name], MD  methylPREDNISolone  (MEDROL  DOSEPAK) 4 MG TBPK tablet Take as directed 02/15/24   Iola Lukes, FNP  metoprolol  succinate (TOPROL -XL) 50 MG 24 hr tablet TAKE 1 TABLET BY MOUTH ONCE DAILY WITH MEALS OR  IMMEDIATELY  FOLLOWING  A  MEAL 05/03/24   Tolia, Sunit, DO  mirabegron  ER (MYRBETRIQ ) 25 MG TB24 tablet Take 25 mg by mouth daily.    [provider]  mometasone  (ELOCON ) 0.1 % cream APPLY 1 APPLICATION TOPICALLY DAILY AS NEEDED (RASH). 02/04/23   Hester Alm BROCKS, MD  montelukast  (SINGULAIR ) 10 MG tablet TAKE 1 TABLET BY MOUTH EVERY DAY Patient taking differently: Take 10 mg by mouth at bedtime. 08/31/21   Austin Ade, MD  mupirocin  ointment (BACTROBAN ) 2 % Apply topically daily. qd to open sores on arms, chest 09/30/23   Hester Alm BROCKS, MD  mupirocin  ointment (BACTROBAN ) 2 % Apply 1 Application topically. 01/24/22   [provider]  nitrofurantoin (MACRODANTIN) 100 MG capsule Take 100 mg by mouth daily. 01/04/24   [provider]  nitroGLYCERIN  (NITROSTAT ) 0.4 MG SL tablet Place 1 tablet (0.4 mg total) under the tongue every 5 (five) minutes as needed. 11/26/23   Tolia, Sunit, DO  omeprazole  (PRILOSEC) 40 MG capsule Take 1 capsule by mouth once daily in the morning 05/06/24   [name], MD  oxyCODONE -acetaminophen  (PERCOCET) 10-325 MG tablet Take 1 tablet by mouth every 6 (six) hours as needed for pain. 06/01/24   [name],  MD  rosuvastatin  (CRESTOR ) 10 MG tablet Take 1 tablet by mouth once daily 05/18/24   [name], MD  sacubitril -valsartan  (ENTRESTO ) 24-26 MG Take 1 tablet by mouth 2 (two) times daily. 04/27/24   Tolia, Sunit, DO  sertraline  (ZOLOFT ) 50 MG tablet Take 1 tablet (50 mg total) by mouth daily. 03/10/24   [name], MD  Thiamine HCl (VITAMIN B-1) 250 MG tablet Take 250 mg by mouth daily.    [provider]  triamcinolone  cream (KENALOG ) 0.1 % Apply 1 Application topically 2 (two) times daily. 08/11/13   [provider]  TRULICITY  3 MG/0.5ML SOAJ INJECT 3 MG INTO THE SKIN AS DIRECTED ONCE A WEEK 05/11/24   [name], MD  VENTOLIN  HFA 108 (90 Base) MCG/ACT inhaler INHALE 2 PUFFS BY MOUTH EVERY 4 HOURS AS NEEDED  FOR WHEEZING OR SHORTNESS OF BREATH 06/02/23   [name], MD    Allergies: Zofran  [ondansetron  hcl], Ace inhibitors, Levofloxacin , Lisinopril, Sulfonamide derivatives, and Sulfa antibiotics    Review of Systems  Gastrointestinal:  Positive for abdominal pain, constipation and vomiting.    Updated Vital Signs BP 98/76   Pulse 80   Temp 98.2 F (36.8 C) (Oral)   Resp 18   SpO2 100%   Physical Exam Vitals and nursing note reviewed.  Constitutional:      General: She is not in acute distress.    Appearance: She is well-developed.  HENT:     Head: Normocephalic and atraumatic.  Eyes:     Conjunctiva/sclera: Conjunctivae normal.  Cardiovascular:     Rate and Rhythm: Normal rate and regular rhythm.     Heart sounds: No murmur heard. Pulmonary:     Effort: Pulmonary effort is normal. No respiratory distress.     Breath sounds: Normal breath sounds.  Abdominal:     Palpations: Abdomen is soft.     Tenderness: There is no abdominal tenderness.  Musculoskeletal:        General: No swelling.     Cervical back: Neck supple.  Skin:    General: Skin is warm and dry.     Capillary Refill: Capillary refill takes less than 2 seconds.  Neurological:      Mental Status: She is alert.  Psychiatric:        Mood and Affect: Mood normal.     (all labs ordered are listed, but only abnormal results are displayed) Labs Reviewed  COMPREHENSIVE METABOLIC PANEL WITH GFR - Abnormal; Notable for the following components:      Result Value   Glucose, Bld 136 (*)    Total Protein 6.0 (*)    Albumin 3.4 (*)    All other components within normal limits  CBC - Abnormal; Notable for the following components:   RBC 3.32 (*)    Hemoglobin 11.0 (*)    HCT 33.5 (*)    MCV 100.9 (*)    All other components within normal limits  URINALYSIS, ROUTINE W REFLEX MICROSCOPIC - Abnormal; Notable for the following components:   APPearance HAZY (*)    Leukocytes,Ua SMALL (*)    Bacteria, UA RARE (*)    All other components within normal limits  LIPASE, BLOOD  TSH    EKG: None  Radiology: CT ABDOMEN PELVIS W CONTRAST Result Date: 06/01/2024 CLINICAL DATA:  Constipation and then diarrhea. Evaluate for ileus or obstruction. EXAM: CT ABDOMEN AND PELVIS WITH CONTRAST TECHNIQUE: Multidetector CT imaging of the abdomen and pelvis was performed using the standard protocol following bolus administration of intravenous contrast. RADIATION DOSE REDUCTION: This exam was performed according to the departmental dose-optimization program which includes automated exposure control, adjustment of the mA and/or kV according to patient size and/or use of iterative reconstruction technique. CONTRAST:  75mL OMNIPAQUE  IOHEXOL  350 MG/ML SOLN COMPARISON:  05/05/2023, 01/21/2012 FINDINGS: Lower chest: Heart size is normal. Calcified plaque over the left anterior descending coronary artery. Calcified plaque over the descending thoracic aorta. Visualized lung bases demonstrate a 5 mm nodular density along the left major fissure unchanged from 2013 and therefore considered benign. No acute airspace process or effusion. Hepatobiliary: Previous cholecystectomy. Liver and biliary tree are normal  per Pancreas: Normal. Spleen: Normal. Adrenals/Urinary Tract: Adrenal glands are normal. Kidneys are normal in size without hydronephrosis or nephrolithiasis. Ureters and bladder are normal. Stomach/Bowel: Stomach and small bowel are normal.  Previous appendectomy. Moderate diverticulosis of the distal descending and sigmoid colon without active inflammation. Colon is otherwise unremarkable. Vascular/Lymphatic: Mild calcified plaque over the abdominal aorta which is normal in caliber. Remaining vascular structures are unremarkable. No adenopathy. Reproductive: Uterus and bilateral adnexa are unremarkable. Other: No free fluid or focal inflammatory change. Evidence of previous right ventral hernia repair. Musculoskeletal: Fusion hardware intact and unchanged from L4 to the sacrum bridging the sacroiliac joints. No acute findings. IMPRESSION: 1. No acute findings in the abdomen/pelvis. 2. Moderate diverticulosis of the distal descending and sigmoid colon without active inflammation. 3. Aortic atherosclerosis. Atherosclerotic coronary artery disease. Aortic Atherosclerosis (ICD10-I70.0). Electronically Signed   By: Toribio Agreste M.D.   On: 06/01/2024 10:25     Procedures   Medications Ordered in the ED  sodium chloride  0.9 % bolus 500 mL (0 mLs Intravenous Stopped 06/01/24 1029)  iohexol  (OMNIPAQUE ) 350 MG/ML injection 75 mL (75 mLs Intravenous Contrast Given 06/01/24 0958)                                    Medical Decision Making Amount and/or Complexity of Data Reviewed Labs: ordered. Radiology: ordered.  Risk Prescription drug management.     HPI:    Patient presents because of abdominal pain.  Patient states that she had episode of vomiting around Thanksgiving.  Patient states that she thinks is because she is taking MiraLAX  as well as Gatorade.  Patient states that generalized abdominal pain.  Mostly lower abdomen.  No previous surgeries.  Not really past much flatulence but did have 2  regular bowel movements yesterday.  Patient states that she has been having some episodes of diarrhea and constipation.  Seems to be off-and-on again.  No fever no chills.  No chest pain or shortness of breath.  No nausea or diarrhea.  No night sweats.  Is endorsing weight loss but she states that she is not hungry and not really eating much.  No lymphadenopathy that she can appreciate.   Previous medical history reviewed : Patient was seen in the ED on October 31.  Diarrhea.  Negative workup at that time.  Followed up with gastroenterology on May 19, 2024.  Chronic diarrhea. MDM: \  Upon exam, patient hemodynamic stable.  ANO x 3 GCS 15.  No focal deficits.  Mostly soft benign abdomen.  No rebound guarding or tenderness I can appreciate.   Will obtain screening laboratory workup including LFTs to make sure there is no large derangements.  Lipase make sure there is no evidence obvious pancreatitis.  CBC as well as other electrolytes.  Obtain CT scan to rule out ileus obstruction versus just idiopathic constipation.   Reevaluation:   Upon reexamination, patient hemodynamically stable.  Remains A&O x 3 with GCS 15.  Hemoglobin at baseline. 11.   No AKI or large electrolyte derangements.  Lipase normal.  LFTs normal.  Bilirubin normal. \   CT scan showed diverticulosis without diverticulitis.  No ileus obstruction.  Patient to be discharged follow-up with PCP  Urine showed rare bacteria and small leuk esterase.  Asymptomatic.  Will not cover for UTI   Interventions: 500 cc NS     I have independently interpreted the  CT  images and agree with the radiologist finding   Social Determinant of Health: no alcohol use    Disposition and Follow Up: pcp       Final diagnoses:  Functional  diarrhea    ED Discharge Orders     None          Simon Lavonia SAILOR, MD 06/01/24 1616

## 2024-06-01 NOTE — Discharge Instructions (Signed)
 There is no evidence of impaction.  If you are having constipation you can take Dulcolax or Colace.  This can be purchased over-the-counter.  Given that you have diarrhea right now, there is no indication to start a stool softener.  Follow-up with a gastroenterologist.  You do have some diverticulosis but there is no evidence of diverticulitis.

## 2024-06-03 ENCOUNTER — Other Ambulatory Visit: Payer: Self-pay | Admitting: *Deleted

## 2024-06-03 ENCOUNTER — Inpatient Hospital Stay: Payer: 59 | Attending: Hematology and Oncology

## 2024-06-03 DIAGNOSIS — D509 Iron deficiency anemia, unspecified: Secondary | ICD-10-CM | POA: Insufficient documentation

## 2024-06-03 DIAGNOSIS — Z87891 Personal history of nicotine dependence: Secondary | ICD-10-CM | POA: Insufficient documentation

## 2024-06-03 DIAGNOSIS — D500 Iron deficiency anemia secondary to blood loss (chronic): Secondary | ICD-10-CM

## 2024-06-03 DIAGNOSIS — R233 Spontaneous ecchymoses: Secondary | ICD-10-CM | POA: Insufficient documentation

## 2024-06-03 LAB — CMP (CANCER CENTER ONLY)
ALT: 11 U/L (ref 0–44)
AST: 25 U/L (ref 15–41)
Albumin: 4.1 g/dL (ref 3.5–5.0)
Alkaline Phosphatase: 89 U/L (ref 38–126)
Anion gap: 9 (ref 5–15)
BUN: 14 mg/dL (ref 8–23)
CO2: 25 mmol/L (ref 22–32)
Calcium: 8.9 mg/dL (ref 8.9–10.3)
Chloride: 107 mmol/L (ref 98–111)
Creatinine: 0.63 mg/dL (ref 0.44–1.00)
GFR, Estimated: >60 mL/min (ref >60)
Glucose, Bld: 121 mg/dL — ABNORMAL HIGH (ref 70–99)
Potassium: 3.7 mmol/L (ref 3.5–5.1)
Sodium: 140 mmol/L (ref 135–145)
Total Bilirubin: 0.4 mg/dL (ref 0.0–1.2)
Total Protein: 6.6 g/dL (ref 6.5–8.1)

## 2024-06-03 LAB — CBC WITH DIFFERENTIAL (CANCER CENTER ONLY)
Abs Immature Granulocytes: 0.01 K/uL (ref 0.00–0.07)
Basophils Absolute: 0.1 K/uL (ref 0.0–0.1)
Basophils Relative: 1 %
Eosinophils Absolute: 0.2 K/uL (ref 0.0–0.5)
Eosinophils Relative: 3 %
HCT: 32.3 % — ABNORMAL LOW (ref 36.0–46.0)
Hemoglobin: 10.8 g/dL — ABNORMAL LOW (ref 12.0–15.0)
Immature Granulocytes: 0 %
Lymphocytes Relative: 25 %
Lymphs Abs: 1.8 K/uL (ref 0.7–4.0)
MCH: 33 pg (ref 26.0–34.0)
MCHC: 33.4 g/dL (ref 30.0–36.0)
MCV: 98.8 fL (ref 80.0–100.0)
Monocytes Absolute: 0.6 K/uL (ref 0.1–1.0)
Monocytes Relative: 9 %
Neutro Abs: 4.6 K/uL (ref 1.7–7.7)
Neutrophils Relative %: 62 %
Platelet Count: 274 K/uL (ref 150–400)
RBC: 3.27 MIL/uL — ABNORMAL LOW (ref 3.87–5.11)
RDW: 13.1 % (ref 11.5–15.5)
WBC Count: 7.2 K/uL (ref 4.0–10.5)
nRBC: 0 % (ref 0.0–0.2)

## 2024-06-03 LAB — IRON AND IRON BINDING CAPACITY (CC-WL,HP ONLY)
Iron: 80 ug/dL (ref 28–170)
Saturation Ratios: 22 % (ref 10.4–31.8)
TIBC: 364 ug/dL (ref 250–450)
UIBC: 285 ug/dL

## 2024-06-03 LAB — RETIC PANEL
Immature Retic Fract: 11.1 % (ref 2.3–15.9)
RBC.: 3.27 MIL/uL — ABNORMAL LOW (ref 3.87–5.11)
Retic Count, Absolute: 86 K/uL (ref 19.0–186.0)
Retic Ct Pct: 2.6 % (ref 0.4–3.1)
Reticulocyte Hemoglobin: 36 pg (ref >27.9)

## 2024-06-03 LAB — FERRITIN: Ferritin: 203 ng/mL (ref 11–307)

## 2024-06-08 NOTE — Assessment & Plan Note (Signed)
 For RA, stable, pain well controlled. PDMP reviewed Refilled 30 day supply of percocet, continue current dose This medication allows for her to complete ADLs and care for her family, do work around the house Urine toxassure today F/u 3 months

## 2024-06-08 NOTE — Patient Instructions (Incomplete)
 Continue your current medications  If any of your results from today are abnormal and/or require changes to your medical care, I will give you a call. Otherwise, I will send you a letter in the mail or a message on MyChart.

## 2024-06-08 NOTE — Assessment & Plan Note (Signed)
Ordered DEXA

## 2024-06-08 NOTE — Assessment & Plan Note (Signed)
 Stable, A1c normal 5.1 UACR today Renal function stable (GFR>60, Cr 0.63) per CMP earlier this month Continue current regimen, f/u 58mo

## 2024-06-08 NOTE — Progress Notes (Unsigned)
    SUBJECTIVE:   CHIEF COMPLAINT / HPI:   T2DM -Current medication regimen: Metformin  1000mg  BID, Trulicity  3mg  weekl. On Crestor  10mg  daily and Entresto  24-26 BID. Also on gabapentin  300mg  at bedtime for neuropathy. -Home CBGs:*** -Denies polyuria, polydipsia, abdominal pain, chest pain, shortness of breath*** -Foot exam: UTD -Eye exam: UTD  Lab Results  Component Value Date   HGBA1C 5.6 03/04/2024   HGBA1C 5.3 12/01/2023   HGBA1C 6.3 09/02/2023    Due for DEXA scan -saw rheumatology last month -she has hx seropositive RA on leflunomide  -documented hx of osteopenia and due for bone density eval. High risk due to prior exposure to prednisone  ***  Chronic pain related to RA  -on percocet, pain well controlled -denies falls, syncope -stores medication in safe area -last dose *** -denies cravings or lack of control with medication dosing -denies side effects -pain contract signed 2016  PERTINENT  PMH / PSH: ***  OBJECTIVE:   There were no vitals taken for this visit.  ***  ASSESSMENT/PLAN:   Assessment & Plan Type 2 diabetes mellitus with diabetic polyneuropathy, without long-term current use of insulin  (HCC) Stable, A1c *** UACR today Renal function stable (GFR>60, Cr 0.63) per CMP earlier this month Continue current regimen, f/u 17mo Osteopenia, unspecified location Ordered DEXA Encounter for chronic pain management For RA, stable, pain well controlled. PDMP reviewed Refilled 30 day supply of percocet, continue current dose This medication allows for her to complete ADLs and care for her family, do work around the house Urine toxassure today F/u 3 months   Payton Coward, MD [facility]

## 2024-06-09 ENCOUNTER — Ambulatory Visit: Admitting: Family Medicine

## 2024-06-09 ENCOUNTER — Encounter: Payer: Self-pay | Admitting: Family Medicine

## 2024-06-09 VITALS — BP 114/66 | HR 74 | Ht 62.0 in | Wt 118.1 lb

## 2024-06-09 DIAGNOSIS — G8929 Other chronic pain: Secondary | ICD-10-CM

## 2024-06-09 DIAGNOSIS — M8580 Other specified disorders of bone density and structure, unspecified site: Secondary | ICD-10-CM | POA: Diagnosis not present

## 2024-06-09 DIAGNOSIS — E1142 Type 2 diabetes mellitus with diabetic polyneuropathy: Secondary | ICD-10-CM

## 2024-06-09 DIAGNOSIS — E2839 Other primary ovarian failure: Secondary | ICD-10-CM

## 2024-06-09 LAB — POCT GLYCOSYLATED HEMOGLOBIN (HGB A1C): HbA1c, POC (controlled diabetic range): 5.1 % (ref 0.0–7.0)

## 2024-06-10 ENCOUNTER — Inpatient Hospital Stay: Payer: 59 | Admitting: Hematology and Oncology

## 2024-06-10 ENCOUNTER — Other Ambulatory Visit: Payer: Self-pay | Admitting: Family Medicine

## 2024-06-10 VITALS — BP 113/78 | HR 73 | Temp 97.2°F | Resp 14 | Wt 118.1 lb

## 2024-06-10 DIAGNOSIS — D500 Iron deficiency anemia secondary to blood loss (chronic): Secondary | ICD-10-CM | POA: Diagnosis not present

## 2024-06-10 DIAGNOSIS — D649 Anemia, unspecified: Secondary | ICD-10-CM | POA: Diagnosis not present

## 2024-06-10 DIAGNOSIS — J45909 Unspecified asthma, uncomplicated: Secondary | ICD-10-CM

## 2024-06-10 DIAGNOSIS — D509 Iron deficiency anemia, unspecified: Secondary | ICD-10-CM | POA: Diagnosis not present

## 2024-06-10 LAB — LIPID PANEL
Chol/HDL Ratio: 2.5 ratio (ref 0.0–4.4)
Cholesterol, Total: 101 mg/dL (ref 100–199)
HDL: 41 mg/dL (ref >39)
LDL Chol Calc (NIH): 41 mg/dL (ref 0–99)
Triglycerides: 99 mg/dL (ref 0–149)
VLDL Cholesterol Cal: 19 mg/dL (ref 5–40)

## 2024-06-10 NOTE — Progress Notes (Unsigned)
 [facility] [phone]   [phone]  PROGRESS NOTE  Patient Care Team: [name], MD as PCP - General (Family Medicine) Michele Richardson, DO as PCP - Cardiology (Cardiology) Patel, Donika K, DO as Consulting Physician (Neurology) Abigail Birmingham as Consulting Physician (Optometry)  Hematological/Oncological History # Iron  Deficiency Anemia of Unclear Etiology # Easy Bruising 07/30/2022: Hgb 10.5 08/29/2022: establish care with Dr. Federico  09/12/2022-10/10/2022: IV iron  sucrose 200 mg x 5 doses.  12/04/2022: White blood cell 13.9, hemoglobin 12.1, MCV 94.8, and platelets of 233  Interval History:  Michelle [name] 73 y.o. female with medical history significant for deficiency anemia who presents for a follow up visit. The patient's last visit was on 06/11/2023. In the interim since the last visit she has had no major changes in her health.  On exam today Michelle [name] reports she has been well overall in the interim since our last visit.  She reports that she has recently had some issues with diarrhea as well as nausea and vomiting.  She underwent a CT scan which did show some diverticulitis.  She was told at 1 point in time that she may have had a fecal impaction and was told to take MiraLAX  and Gatorade.  She reports that her weight however has been steady.  She notes her energy level remains poor.  She thinks her energy today is about a 0 out of 10.  She has had no overt signs of bleeding, bruising, or dark stools.  Reports her appetite also remains poor.  She reports that at 1 point in time she weighed 200 pounds and though she has been steadily at 118 over the last several checks is discouraged by her low weight.  Otherwise she is having no fevers, chills, sweats.  A full 10 point ROS is otherwise negative.  MEDICAL HISTORY:  Past Medical History:  Diagnosis Date   Actinic keratosis    Allergic rhinitis 08/28/2006   Anemia    low iron    Anginal pain     Asthma, chronic 03/14/2010   Does seem to use her albuterol  excessively. Consider change to more manageable medicines and treat more like COPD in the future.   Carpal tunnel syndrome, left 05/29/2009   Last Assessment & Plan:  Advised to wear wrist brace at night when she is sleeping.  If this does not help will consider having her go to sports medicine.   CHF (congestive heart failure) (HCC) 07/2022   HFrEF, NYHA Class I   Chronic frontal sinusitis 08/28/2016   Chronic left maxillary sinusitis 08/28/2016   Diverticulosis    Essential hypertension 10/30/2012   Gastroparesis 11/19/2011   DM and chronic narcotics contribute.  Causes dysphagia like symptoms    Generalized anxiety disorder 08/28/2006   GERD (gastroesophageal reflux disease)    Hearing loss 07/19/2015   Hearing aid in right ear   History of cataracts, bilateral    Hyperlipidemia 08/28/2006   Lumbar radiculopathy 04/07/2018   Lung nodules    Nonischemic cardiomyopathy (HCC)    Osteoporosis 08/08/2011   Pain in joint, pelvic region and thigh 11/18/2012   Radial styloid tenosynovitis 08/15/2013   Restless leg syndrome 05/14/2012   Seropositive rheumatoid arthritis 01/08/2010   Patient is being followed by rheumatologist at [facility]  patient is on Plaquenil and as well as prednisone .   Changed to methotrexate and prednisone  in May of 2013  Patient does have a pain contract here with [facility] receives  oxycodone  10/325 mg tabs every 6 hours. This has been titrated down from significant doses of methadone previously. Could attempt to tit   Shoulder joint pain 03/25/2011   The patient is compensating due to her not being able to use her left arm right now rotator cuff impingement if he continues to worsen would not be surprised if she does get a tear on this side as well   Squamous cell carcinoma in situ of skin 11/25/2014   Referred to dermatology for excision    Squamous cell carcinoma of skin  12/26/2014   Right distal pretibial. SCC-KA pattern.   Squamous cell carcinoma of skin 03/27/2015   Right inf. lat. knee. SCC-KA pattern.    Squamous cell carcinoma of skin 10/07/2016   Left lateral calf superior. WD SCC.   Squamous cell carcinoma of skin 10/07/2016   Left lateral calf inferior. WD SCC.   Squamous cell carcinoma of skin 04/28/2018   Left mid med. pretibial. WD SCC.   Squamous cell carcinoma of skin 08/12/2019   Left mid med. pretibial sup. KA type. EDC   Squamous cell carcinoma of skin 08/12/2019   Left mid med. pretibial inferior. WD. EDC.   Tear of medial meniscus of knee, left 04/27/2009   Type II diabetes mellitus 08/28/2006   Overview:  Patient has her ups and downs, go in line with flares of her RA and prednisone  uses.  Patient does have hx of hypoglycemic events so would not try for perfect control but should have A1c goal around 7.0 Lab Results  Component Value Date   HGBA1C 6.9 12/27/2011   Last Assessment & Plan:  At goal. Will continue current metformin     SURGICAL HISTORY: Past Surgical History:  Procedure Laterality Date   ABDOMINAL HYSTERECTOMY     APPENDECTOMY     BACK SURGERY     BIOPSY  05/10/2021   Procedure: BIOPSY;  Surgeon: Teressa Toribio SQUIBB, MD;  Location: [facility];  Service: Endoscopy;;  EGD and COLON   CARPAL TUNNEL RELEASE Right    CATARACT EXTRACTION Left    with lid lift   CHOLECYSTECTOMY     COLONOSCOPY     COLONOSCOPY WITH PROPOFOL  N/A 05/10/2021   Procedure: COLONOSCOPY WITH PROPOFOL ;  Surgeon: Teressa Toribio SQUIBB, MD;  Location: [facility];  Service: Endoscopy;  Laterality: N/A;   ESOPHAGOGASTRODUODENOSCOPY (EGD) WITH PROPOFOL  N/A 05/10/2021   Procedure: ESOPHAGOGASTRODUODENOSCOPY (EGD) WITH PROPOFOL ;  Surgeon: Teressa Toribio SQUIBB, MD;  Location: [facility];  Service: Endoscopy;  Laterality: N/A;   HERNIA REPAIR     KNEE ARTHROSCOPY WITH MEDIAL MENISECTOMY Right 03/26/2023   Procedure: KNEE ARTHROSCOPY WITH MEDIAL MENISECTOMY;   Surgeon: Cristy Bonner DASEN, MD;  Location: [facility];  Service: Orthopedics;  Laterality: Right;   KNEE SURGERY Right    x2- cartilage annd scar tissue   LEFT AND RIGHT HEART CATHETERIZATION WITH CORONARY ANGIOGRAM N/A 08/17/2013   Procedure: LEFT AND RIGHT HEART CATHETERIZATION WITH CORONARY ANGIOGRAM;  Surgeon: Erick JONELLE Bergamo, MD;  Location: [facility] CATH LAB;  Service: Cardiovascular;  Laterality: N/A;   lumbar back surgery  04/04/2018   done by neurosurgeron   right foot bone spurs removed     RIGHT/LEFT HEART CATH AND CORONARY ANGIOGRAPHY N/A 07/30/2022   Procedure: RIGHT/LEFT HEART CATH AND CORONARY ANGIOGRAPHY;  Surgeon: Elmira Newman PARAS, MD;  Location: [facility] INVASIVE CV LAB;  Service: Cardiovascular;  Laterality: N/A;   ROTATOR CUFF REPAIR Right    SHOULDER SURGERY Left    x2- rotatar cuff  tear   SYNOVECTOMY Right 03/26/2023   Procedure: SYNOVECTOMY;  Surgeon: Cristy Bonner DASEN, MD;  Location: [facility];  Service: Orthopedics;  Laterality: Right;   UPPER GASTROINTESTINAL ENDOSCOPY      SOCIAL HISTORY: Social History   Socioeconomic History   Marital status: Married    Spouse name: Warehouse Manager   Number of children: 3   Years of education: 9   Highest education level: 9th grade  Occupational History   Occupation: Retired/disabled    Associate Professor: UNEMPLOYED  Tobacco Use   Smoking status: Former    Current packs/day: 0.00    Average packs/day: 1.5 packs/day for 5.0 years (7.5 ttl pk-yrs)    Types: Cigarettes    Start date: 05/31/1989    Quit date: 05/31/1994    Years since quitting: 30.0    Passive exposure: Past   Smokeless tobacco: Never  Vaping Use   Vaping status: Never Used  Substance and Sexual Activity   Alcohol use: No    Alcohol/week: 0.0 standard drinks of alcohol   Drug use: No   Sexual activity: Not on file  Other Topics Concern   Not on file  Social History Narrative   Lives with husband and granddgt.   Lots of family stressors including special-needs grandaughter who  lives with them.      Advance Directives: None - Patient's plan is that her husband will make decisions for her if she were not capable of making informed decisions for herself. (Discussion with Krystal McDiarmid, MD 12/11/18)   Full Code: Desires CPR/ACLS  (Discussion with Krystal McDiarmid, MD 12/11/18)         Social Drivers of Health   Tobacco Use: Medium Risk (06/09/2024)   Patient History    Smoking Tobacco Use: Former    Smokeless Tobacco Use: Never    Passive Exposure: Past  Physicist, Medical Strain: Low Risk (09/04/2023)   Overall Financial Resource Strain (CARDIA)    Difficulty of Paying Living Expenses: Not hard at all  Food Insecurity: No Food Insecurity (09/04/2023)   Hunger Vital Sign    Worried About Running Out of Food in the Last Year: Never true    Ran Out of Food in the Last Year: Never true  Transportation Needs: No Transportation Needs (09/04/2023)   PRAPARE - Administrator, Civil Service (Medical): No    Lack of Transportation (Non-Medical): No  Physical Activity: Inactive (09/04/2023)   Exercise Vital Sign    Days of Exercise per Week: 0 days    Minutes of Exercise per Session: 0 min  Stress: No Stress Concern Present (09/04/2023)   [employer] of Occupational Health - Occupational Stress Questionnaire    Feeling of Stress : Not at all  Social Connections: Moderately Integrated (09/04/2023)   Social Connection and Isolation Panel    Frequency of Communication with Friends and Family: More than three times a week    Frequency of Social Gatherings with Friends and Family: More than three times a week    Attends Religious Services: 1 to 4 times per year    Active Member of [employer] or Organizations: No    Attends Banker Meetings: Never    Marital Status: Married  Catering Manager Violence: Not At Risk (05/20/2024)   Received from [facility]   Epic    Within the last year, have you been afraid of your partner or ex-partner?: No    Within  the last year, have you been humiliated or emotionally  abused in other ways by your partner or ex-partner?: No    Within the last year, have you been kicked, hit, slapped, or otherwise physically hurt by your partner or ex-partner?: No    Within the last year, have you been raped or forced to have any kind of sexual activity by your partner or ex-partner?: No  Depression (PHQ2-9): Low Risk (06/09/2024)   Depression (PHQ2-9)    PHQ-2 Score: 3  Recent Concern: Depression (PHQ2-9) - Medium Risk (04/30/2024)   Depression (PHQ2-9)    PHQ-2 Score: 9  Alcohol Screen: Low Risk (09/04/2023)   Alcohol Screen    Last Alcohol Screening Score (AUDIT): 0  Housing: Low Risk (09/04/2023)   Housing Stability Vital Sign    Unable to Pay for Housing in the Last Year: No    Number of Times Moved in the Last Year: 0    Homeless in the Last Year: No  Utilities: Not At Risk (09/04/2023)   AHC Utilities    Threatened with loss of utilities: No  Health Literacy: Adequate Health Literacy (09/04/2023)   B1300 Health Literacy    Frequency of need for help with medical instructions: Never    FAMILY HISTORY: Family History  Problem Relation Age of Onset   Stroke Father 51   Colon cancer Maternal Aunt    Tremor Mother    Lung cancer Brother 89   Rheum arthritis Sister 58   Colon cancer Daughter    COPD Sister    Cancer Sister        in nose   Diabetes Daughter    Hypertension Daughter    Healthy Daughter    Bell's palsy Maternal Grandmother    Esophageal cancer Neg Hx    Rectal cancer Neg Hx    Stomach cancer Neg Hx     ALLERGIES:  is allergic to zofran  [ondansetron  hcl], ace inhibitors, levofloxacin , lisinopril, sulfonamide derivatives, and sulfa antibiotics.  MEDICATIONS:  Current Outpatient Medications  Medication Sig Dispense Refill   Accu-Chek Softclix Lancets lancets Please use to check blood sugar up to three times per day. E11.42 200 each 4   benzonatate  (TESSALON ) 200 MG capsule Take 1 capsule  (200 mg total) by mouth 3 (three) times daily as needed for cough. 30 capsule 0   betamethasone  dipropionate 0.05 % lotion Apply topically daily.     Blood Glucose Monitoring Suppl (ACCU-CHEK GUIDE ME) w/Device KIT USE AS DIRECTED TO  CHECK  BLOOD  SUGAR  UP  TO  3  TIMES  A  DAY 1 kit 2   BREO ELLIPTA  200-25 MCG/ACT AEPB Inhale 1 puff into the lungs daily.     cetirizine  (ZYRTEC ) 10 MG tablet Take 10 mg by mouth daily.     clobetasol  (TEMOVATE ) 0.05 % external solution Apply 1 Application topically daily. qd up to 5 days a week to aa scalp prn psoriasis flares, avoid face, groin, axilla 50 mL 5   cromolyn  (OPTICROM ) 4 % ophthalmic solution Place 1 drop into both eyes 3 (three) times daily as needed (itchy eyes).     Cyanocobalamin  (B-12) 5000 MCG CAPS Take 5,000 mcg by mouth daily.     Dextromethorphan-guaiFENesin  (MUCINEX  DM MAXIMUM STRENGTH) 60-1200 MG TB12 Take 1 tablet by mouth 2 (two) times daily. 20 tablet 0   diphenhydramine -acetaminophen  (TYLENOL  PM) 25-500 MG TABS tablet Take 2 tablets by mouth at bedtime as needed (sleep).     DULERA  100-5 MCG/ACT AERO Inhale 2 puffs into the lungs 2 (two) times daily.  fluconazole  (DIFLUCAN ) 150 MG tablet Take one dose by mouth today. 1 tablet 0   fluticasone  (FLONASE ) 50 MCG/ACT nasal spray USE 2 SPRAY(S) IN EACH NOSTRIL ONCE DAILY AS NEEDED FOR ALLERGIES 16 g 0   gabapentin  (NEURONTIN ) 300 MG capsule TAKE 1 CAPSULE BY MOUTH EVERYDAY AT BEDTIME 90 capsule 3   gentamicin  cream (GARAMYCIN ) 0.1 % APPLY  CREAM TOPICALLY TO AFFECTED AREA ONCE DAILY TO  TOE 30 g 0   glucose blood (ACCU-CHEK GUIDE TEST) test strip Please use to check blood sugar up to three times per day. E11.42 100 each 12   Lancets Misc. (ACCU-CHEK SOFTCLIX LANCET DEV) KIT Please use to check blood sugar up to three times per day. E11.42 1 kit 0   leflunomide  (ARAVA ) 20 MG tablet Take 20 mg by mouth daily.     loperamide  (IMODIUM ) 2 MG capsule Take 1 capsule (2 mg total) by mouth as  needed for diarrhea or loose stools. 30 capsule 0   metFORMIN  (GLUCOPHAGE -XR) 500 MG 24 hr tablet TAKE 2 TABLETS BY MOUTH TWICE A DAY 360 tablet 1   metoprolol  succinate (TOPROL -XL) 50 MG 24 hr tablet TAKE 1 TABLET BY MOUTH ONCE DAILY WITH MEALS OR  IMMEDIATELY  FOLLOWING  A  MEAL 90 tablet 1   mirabegron  ER (MYRBETRIQ ) 25 MG TB24 tablet Take 25 mg by mouth daily.     mometasone  (ELOCON ) 0.1 % cream APPLY 1 APPLICATION TOPICALLY DAILY AS NEEDED (RASH). 45 g 2   montelukast  (SINGULAIR ) 10 MG tablet TAKE 1 TABLET BY MOUTH EVERY DAY (Patient taking differently: Take 10 mg by mouth at bedtime.) 30 tablet 0   mupirocin  ointment (BACTROBAN ) 2 % Apply topically daily. qd to open sores on arms, chest 22 g 11   mupirocin  ointment (BACTROBAN ) 2 % Apply 1 Application topically.     nitrofurantoin (MACRODANTIN) 100 MG capsule Take 100 mg by mouth daily.     nitroGLYCERIN  (NITROSTAT ) 0.4 MG SL tablet Place 1 tablet (0.4 mg total) under the tongue every 5 (five) minutes as needed. 30 tablet 0   omeprazole  (PRILOSEC) 40 MG capsule Take 1 capsule by mouth once daily in the morning 90 capsule 0   oxyCODONE -acetaminophen  (PERCOCET) 10-325 MG tablet Take 1 tablet by mouth every 6 (six) hours as needed for pain. 120 tablet 0   rosuvastatin  (CRESTOR ) 10 MG tablet Take 1 tablet by mouth once daily 90 tablet 0   sacubitril -valsartan  (ENTRESTO ) 24-26 MG Take 1 tablet by mouth 2 (two) times daily. 180 tablet 2   sertraline  (ZOLOFT ) 50 MG tablet Take 1 tablet (50 mg total) by mouth daily. 90 tablet 3   Thiamine HCl (VITAMIN B-1) 250 MG tablet Take 250 mg by mouth daily.     triamcinolone  cream (KENALOG ) 0.1 % Apply 1 Application topically 2 (two) times daily.     TRULICITY  3 MG/0.5ML SOAJ INJECT 3 MG INTO THE SKIN AS DIRECTED ONCE A WEEK 12 mL 0   VENTOLIN  HFA 108 (90 Base) MCG/ACT inhaler INHALE 2 PUFFS BY MOUTH EVERY 4 HOURS AS NEEDED FOR WHEEZING FOR SHORTNESS OF BREATH 108 g 0   No current facility-administered  medications for this visit.    REVIEW OF SYSTEMS:   Constitutional: ( - ) fevers, ( - )  chills , ( - ) night sweats Eyes: ( - ) blurriness of vision, ( - ) double vision, ( - ) watery eyes Ears, nose, mouth, throat, and face: ( - ) mucositis, ( - ) sore throat  Respiratory: ( - ) cough, ( - ) dyspnea, ( - ) wheezes Cardiovascular: ( - ) palpitation, ( - ) chest discomfort, ( - ) lower extremity swelling Gastrointestinal:  ( - ) nausea, ( - ) heartburn, ( - ) change in bowel habits Skin: ( - ) abnormal skin rashes Lymphatics: ( - ) new lymphadenopathy, ( - ) easy bruising Neurological: ( - ) numbness, ( - ) tingling, ( - ) new weaknesses Behavioral/Psych: ( - ) mood change, ( - ) new changes  All other systems were reviewed with the patient and are negative.  PHYSICAL EXAMINATION:  Vitals:   06/10/24 0852  BP: 113/78  Pulse: 73  Resp: 14  Temp: (!) 97.2 F (36.2 C)  SpO2: 100%    Filed Weights   06/10/24 0852  Weight: 118 lb 1.6 oz (53.6 kg)      GENERAL: Well-appearing elderly Caucasian female, alert, no distress and comfortable SKIN: skin color, texture, turgor are normal, no rashes or significant lesions EYES: conjunctiva are pink and non-injected, sclera clear LUNGS: clear to auscultation and percussion with normal breathing effort HEART: regular rate & rhythm and no murmurs and no lower extremity edema Musculoskeletal: no cyanosis of digits and no clubbing  PSYCH: alert & oriented x 3, fluent speech NEURO: no focal motor/sensory deficits  LABORATORY DATA:  I have reviewed the data as listed    Latest Ref Rng & Units 06/03/2024    8:51 AM 06/01/2024    8:05 AM 05/19/2024    3:23 PM  CBC  WBC 4.0 - 10.5 K/uL 7.2  8.3  8.3   Hemoglobin 12.0 - 15.0 g/dL 89.1  88.9  89.3   Hematocrit 36.0 - 46.0 % 32.3  33.5  32.6   Platelets 150 - 400 K/uL 274  286  312.0        Latest Ref Rng & Units 06/03/2024    8:51 AM 06/01/2024    8:05 AM 05/19/2024    3:23 PM  CMP   Glucose 70 - 99 mg/dL 878  863  98   BUN 8 - 23 mg/dL 14  21  16    Creatinine 0.44 - 1.00 mg/dL 9.36  9.38  9.45   Sodium 135 - 145 mmol/L 140  138  138   Potassium 3.5 - 5.1 mmol/L 3.7  3.6  4.7   Chloride 98 - 111 mmol/L 107  104  105   CO2 22 - 32 mmol/L 25  25  27    Calcium  8.9 - 10.3 mg/dL 8.9  9.2  9.0   Total Protein 6.5 - 8.1 g/dL 6.6  6.0  6.4   Total Bilirubin 0.0 - 1.2 mg/dL 0.4  0.6  0.5   Alkaline Phos 38 - 126 U/L 89  74  82   AST 15 - 41 U/L 25  16  13    ALT 0 - 44 U/L 11  13  10      Lab Results  Component Value Date   MPROTEIN Not Observed 08/29/2022   Lab Results  Component Value Date   KPAFRELGTCHN 23.0 (H) 08/29/2022   LAMBDASER 18.6 08/29/2022   KAPLAMBRATIO 1.24 08/29/2022     RADIOGRAPHIC STUDIES: CT ABDOMEN PELVIS W CONTRAST Result Date: 06/01/2024 CLINICAL DATA:  Constipation and then diarrhea. Evaluate for ileus or obstruction. EXAM: CT ABDOMEN AND PELVIS WITH CONTRAST TECHNIQUE: Multidetector CT imaging of the abdomen and pelvis was performed using the standard protocol following bolus administration of intravenous contrast. RADIATION DOSE REDUCTION:  This exam was performed according to the departmental dose-optimization program which includes automated exposure control, adjustment of the mA and/or kV according to patient size and/or use of iterative reconstruction technique. CONTRAST:  75mL OMNIPAQUE  IOHEXOL  350 MG/ML SOLN COMPARISON:  05/05/2023, 01/21/2012 FINDINGS: Lower chest: Heart size is normal. Calcified plaque over the left anterior descending coronary artery. Calcified plaque over the descending thoracic aorta. Visualized lung bases demonstrate a 5 mm nodular density along the left major fissure unchanged from 2013 and therefore considered benign. No acute airspace process or effusion. Hepatobiliary: Previous cholecystectomy. Liver and biliary tree are normal per Pancreas: Normal. Spleen: Normal. Adrenals/Urinary Tract: Adrenal glands are normal.  Kidneys are normal in size without hydronephrosis or nephrolithiasis. Ureters and bladder are normal. Stomach/Bowel: Stomach and small bowel are normal. Previous appendectomy. Moderate diverticulosis of the distal descending and sigmoid colon without active inflammation. Colon is otherwise unremarkable. Vascular/Lymphatic: Mild calcified plaque over the abdominal aorta which is normal in caliber. Remaining vascular structures are unremarkable. No adenopathy. Reproductive: Uterus and bilateral adnexa are unremarkable. Other: No free fluid or focal inflammatory change. Evidence of previous right ventral hernia repair. Musculoskeletal: Fusion hardware intact and unchanged from L4 to the sacrum bridging the sacroiliac joints. No acute findings. IMPRESSION: 1. No acute findings in the abdomen/pelvis. 2. Moderate diverticulosis of the distal descending and sigmoid colon without active inflammation. 3. Aortic atherosclerosis. Atherosclerotic coronary artery disease. Aortic Atherosclerosis (ICD10-I70.0). Electronically Signed   By: Toribio Agreste M.D.   On: 06/01/2024 10:25   DG Abd 1 View Result Date: 05/24/2024 CLINICAL DATA:  Constipation, assess stool burden EXAM: ABDOMEN - 1 VIEW COMPARISON:  Prior CT abdomen/pelvis 08/21/2022 FINDINGS: The bowel gas pattern is not obstructed. There is a moderate volume of formed stool in the descending and sigmoid colon. No large rectal stool ball to suggest obstruction. Surgical clips in the right upper quadrant consistent with prior cholecystectomy. Evidence of lumbar spine surgery and bilateral sacroiliac joint arthrodesis. IMPRESSION: Moderate stool burden in the descending and sigmoid colon. Electronically Signed   By: Wilkie Lent M.D.   On: 05/24/2024 09:32   DG Chest 2 View Result Date: 05/24/2024 CLINICAL DATA:  Cough EXAM: CHEST - 2 VIEW COMPARISON:  Chest x-ray performed February 15, 2024 FINDINGS: The heart size and mediastinal contours are within normal limits.  Minimal scarring or atelectasis is favored in the lingula. Degenerative changes in the thoracic spine. IMPRESSION: No active cardiopulmonary disease. Electronically Signed   By: Maude Naegeli M.D.   On: 05/24/2024 06:14    ASSESSMENT & PLAN ATLAS KUC 73 y.o. female with medical history significant for deficiency anemia who presents for a follow up visit.    After review of the labs, review of the records, and discussion with the patient the patients findings are most consistent with iron  deficiency anemia of unclear cause, suspect GI bleeding.  Additionally is possible the patient has anemia of chronic disease due to her rheumatoid arthritis.   # Iron  Deficiency Anemia 2/2 to Unclear Cause, suspect GI Bleeding -- Findings are consistent with iron  deficiency anemia, etiology is unclear.  --Encouraged her to follow-up with GI if she is found to be iron  deficient.  --patient is s/p IV iron  sucrose 200 mg x 5 doses in March/April 2024 --labs show White blood cell 7.2, Hgb 10.8, MCV 98.8, Plt 274  --Given that hemoglobin levels have been consistently stuck around 11.0 we have offered bone marrow biopsy to evaluate further.  She would like to pursue this. --  Patient is not currently taking p.o. iron  therapy, though does eat an iron  rich diet. --Plan for return to clinic in 6 months  # Leukocytosis-resolved -- likely 2/2 to UTI at time of prior draw.  -- no recent steroid therapy -- continue to monitor.   Orders Placed This Encounter  Procedures   CT BONE MARROW BIOPSY & ASPIRATION    Standing Status:   Future    Expected Date:   06/20/2024    Expiration Date:   06/13/2025    Reason for Exam (SYMPTOM  OR DIAGNOSIS REQUIRED):   normocytic anemia of unclear etiology, requesting bone marrow biopsy    Preferred location?:   [facility]   CT BIOPSY    Standing Status:   Future    Expected Date:   06/20/2024    Expiration Date:   06/13/2025    Lab orders requested (DO NOT place  separate lab orders, these will be automatically ordered during procedure specimen collection)::   Surgical Pathology    Reason for Exam (SYMPTOM  OR DIAGNOSIS REQUIRED):   normocytic anemia of unclear etiology, requesting bone marrow biopsy    Preferred location?:   [facility]   All questions were answered. The patient knows to call the clinic with any problems, questions or concerns.  A total of more than 30 minutes were spent on this encounter with face-to-face time and non-face-to-face time, including preparing to see the patient, ordering tests and/or medications, counseling the patient and coordination of care as outlined above.   Norleen IVAR Kidney, MD Department of Hematology/Oncology [facility] Phone: [phone] Pager: [phone] Email: [email or web address]  06/13/2024 4:59 PM

## 2024-06-14 LAB — TOXASSURE SELECT 13 (MW), URINE

## 2024-06-15 NOTE — Progress Notes (Signed)
 Michelle [name]                                          MRN: [id number]   06/15/2024   The VBCI Quality Team Specialist reviewed this patient medical record for the purposes of chart review for care gap closure. The following were reviewed: abstraction for care gap closure-glycemic status assessment.    VBCI Quality Team

## 2024-06-15 NOTE — Progress Notes (Signed)
 Michelle [name]                                          MRN: [id number]   06/15/2024   The VBCI Quality Team Specialist reviewed this patient medical record for the purposes of chart review for care gap closure. The following were reviewed: chart review for care gap closure-kidney health evaluation for diabetes:eGFR  and uACR.    VBCI Quality Team

## 2024-06-16 ENCOUNTER — Ambulatory Visit: Payer: Self-pay | Admitting: Family Medicine

## 2024-06-26 ENCOUNTER — Other Ambulatory Visit: Payer: Self-pay | Admitting: Family Medicine

## 2024-06-26 DIAGNOSIS — E1142 Type 2 diabetes mellitus with diabetic polyneuropathy: Secondary | ICD-10-CM

## 2024-07-05 DIAGNOSIS — M059 Rheumatoid arthritis with rheumatoid factor, unspecified: Secondary | ICD-10-CM

## 2024-07-05 DIAGNOSIS — G8929 Other chronic pain: Secondary | ICD-10-CM

## 2024-07-05 MED ORDER — OXYCODONE-ACETAMINOPHEN 10-325 MG PO TABS: 1.0000 | ORAL_TABLET | Freq: Four times a day (QID) | ORAL | 0 refills | Status: DC | PRN

## 2024-07-06 ENCOUNTER — Encounter: Payer: Self-pay | Admitting: Physician Assistant

## 2024-07-06 ENCOUNTER — Other Ambulatory Visit

## 2024-07-06 ENCOUNTER — Ambulatory Visit: Admitting: Physician Assistant

## 2024-07-06 ENCOUNTER — Other Ambulatory Visit: Payer: Self-pay

## 2024-07-06 ENCOUNTER — Ambulatory Visit (INDEPENDENT_AMBULATORY_CARE_PROVIDER_SITE_OTHER)
Admission: RE | Admit: 2024-07-06 | Discharge: 2024-07-06 | Disposition: A | Discharge status: Home / Self Care | Source: Ambulatory Visit | Attending: Physician Assistant | Admitting: Physician Assistant

## 2024-07-06 VITALS — BP 82/48 | HR 86 | Ht 62.0 in | Wt 115.4 lb

## 2024-07-06 DIAGNOSIS — K219 Gastro-esophageal reflux disease without esophagitis: Secondary | ICD-10-CM

## 2024-07-06 DIAGNOSIS — Z860101 Personal history of adenomatous and serrated colon polyps: Secondary | ICD-10-CM | POA: Diagnosis not present

## 2024-07-06 DIAGNOSIS — K3184 Gastroparesis: Secondary | ICD-10-CM

## 2024-07-06 DIAGNOSIS — D7589 Other specified diseases of blood and blood-forming organs: Secondary | ICD-10-CM | POA: Diagnosis not present

## 2024-07-06 DIAGNOSIS — K580 Irritable bowel syndrome with diarrhea: Secondary | ICD-10-CM

## 2024-07-06 DIAGNOSIS — D649 Anemia, unspecified: Secondary | ICD-10-CM | POA: Diagnosis not present

## 2024-07-06 DIAGNOSIS — R197 Diarrhea, unspecified: Secondary | ICD-10-CM

## 2024-07-06 DIAGNOSIS — E1142 Type 2 diabetes mellitus with diabetic polyneuropathy: Secondary | ICD-10-CM

## 2024-07-06 DIAGNOSIS — I5020 Unspecified systolic (congestive) heart failure: Secondary | ICD-10-CM

## 2024-07-06 DIAGNOSIS — K5909 Other constipation: Secondary | ICD-10-CM | POA: Diagnosis not present

## 2024-07-06 NOTE — Progress Notes (Signed)
 "    07/06/2024 RHYDER BRATZ [id number] 11/28/1950  Referring provider: [name], MD Primary GI doctor: Dr. San  ASSESSMENT AND PLAN:   Constipation with diarrhea, possible overflow Culptrits: trulicity , oxycodone , metformin  Blood work 2022 celiac sprue serologies negative, sed rate, CRP, pancreatic elastase normal.  2022 colonoscopy negative microscopic colitis 05/19/2024 KUB moderate stool burden in desending/sigmoid colon 06/01/2024 CTAP W tics without inflammation I have a feeling she continues to have constipation and this is a large portion of her GI symptoms, upper and lower - repeat KUB - consider linzess  GERD with EIM November 2022 EGD small HH normal esophagus nonspecific gastritis negative H. pylori, focal intestinal metaplasia negative esophageal biopsies On omeprazole  40 mg once daily with good control Gastroparesis diet given  History of adenomatous colon polyp.   November 2022 colonoscopy normal TI diverticulosis left colon negative microscopic colitis Recall colonoscopy November 2027  Anemia with macrocytosis 06/03/2024  HGB 10.8 MCV 98.8 Platelets 274 06/03/2024 Iron  80 Ferritin 203 B12 >1500 Recent Labs    12/01/23 1357 12/10/23 0914 04/30/24 1108 05/19/24 1523 06/01/24 0805 06/03/24 0851  HGB 11.3 11.2* 10.7* 10.6* 11.0* 10.8*  Following with Dr. Federico in hematology S/p IV iron  with Dr. Federico, she is no longer on oral iron  November 2022 EGD small HH normal esophagus nonspecific gastritis negative H. pylori, focal intestinal metaplasia negative esophageal biopsies November 2022 colonoscopy normal TI diverticulosis left colon negative microscopic colitis - get hemoccult cards - if positive FOBT or worsening iron  def, consider VCE - continue supportive care  Patient Care Team: [name], MD as PCP - General (Family Medicine) Michele Richardson, DO as PCP - Cardiology (Cardiology) Patel, Donika K, DO as Consulting Physician  (Neurology) Abigail Birmingham as Consulting Physician (Optometry)  Review of pertinent gastrointestinal problems: 1.  History of adenomatous colon polyp.   Colonoscopy October 2019 single subcentimeter adenoma was removed.  Diverticulosis was noted as well. November 2022 colonoscopy normal TI diverticulosis left colon negative microscopic colitis  2.  EGD October 2019 mild gastritis and small hiatal hernia.  Pathology was negative for H. Pylori. November 2022 EGD small HH normal esophagus nonspecific gastritis negative H. pylori, focal intestinal metaplasia negative esophageal biopsies 3.  Chronic diarrhea, many of her medicines probably play a role including metformin , Trulicity , arava .   Blood work 2022 celiac sprue serologies negative.  GI pathogen panel 2022 was positive for Campylobacter, treatment with a azithromycin  1 week did not help.  Repeat GI pathogen panel negative, pancreatic elastase was normal.  Sed rate normal.  CRP normal. 2022 colonoscopy negative microscopic colitis  HISTORY OF PRESENT ILLNESS: 74 y.o. female with a past medical history listed below presents for evaluation of diarrhea.   I last saw the patient in the office 05/19/2024 for diarrhea.  Current HPI Discussed the use of AI scribe software for clinical note transcription with the patient, who gave verbal consent to proceed.  74 year old female with chronic constipation, intermittent diarrhea, colonic diverticulosis, and iron  deficiency anemia who presents for follow-up of gastrointestinal symptoms and recent workup.  Since her last visit in November for evaluation of diarrhea, bowel movements have improved with passage of formed stools and intermittent diarrhea. She currently denies constipation, abdominal pain, discomfort, heartburn, or reflux. No dark or bloody stools observed.  An abdominal x-ray in November showed moderate stool burden in the descending and sigmoid colon. Miralax  and fiber were initiated but  discontinued due to vomiting. A CT scan in December was performed, and the  patient recalls being told there was no blockage. Intermittent diarrhea and formed stools persist.  Iron  deficiency anemia is present. She is not currently taking oral iron . Iron  studies in December were within normal limits. She is scheduled for a bone marrow procedure and takes B12 and B1.  Reports unintentional weight loss, now weighing 115 pounds, with poor appetite. Persistent tremors of unclear etiology are noted.  Describes chronic back pain and intermittent pain under her ribs. She remains able to manage household tasks, including cooking for her granddaughter, but expresses concern about her overall health and functional status.  She  reports that she quit smoking about 30 years ago. Her smoking use included cigarettes. She started smoking about 35 years ago. She has a 7.5 pack-year smoking history. She has been exposed to tobacco smoke. She has never used smokeless tobacco. She reports that she does not drink alcohol and does not use drugs.  RELEVANT GI HISTORY, IMAGING AND LABS: Labs Iron  (05/2024): Within normal limits  Radiology Abdominal X-ray (05/19/2024): Moderate fecal burden in descending and sigmoid colon (Independently interpreted) CT abdomen and pelvis (05/2024): Colonic diverticulosis; no diverticulitis; significant fecal burden in left colon (Independently interpreted)  CBC    Component Value Date/Time   WBC 7.2 06/03/2024 0851   WBC 8.3 06/01/2024 0805   RBC 3.27 (L) 06/03/2024 0851   RBC 3.27 (L) 06/03/2024 0851   HGB 10.8 (L) 06/03/2024 0851   HGB 11.3 12/01/2023 1357   HCT 32.3 (L) 06/03/2024 0851   HCT 35.5 12/01/2023 1357   PLT 274 06/03/2024 0851   PLT 276 12/01/2023 1357   MCV 98.8 06/03/2024 0851   MCV 103 (H) 12/01/2023 1357   MCH 33.0 06/03/2024 0851   MCHC 33.4 06/03/2024 0851   RDW 13.1 06/03/2024 0851   RDW 11.7 12/01/2023 1357   LYMPHSABS 1.8 06/03/2024 0851    LYMPHSABS 2.1 12/01/2023 1357   MONOABS 0.6 06/03/2024 0851   EOSABS 0.2 06/03/2024 0851   EOSABS 0.4 12/01/2023 1357   BASOSABS 0.1 06/03/2024 0851   BASOSABS 0.1 12/01/2023 1357   Recent Labs    12/01/23 1357 12/10/23 0914 04/30/24 1108 05/19/24 1523 06/01/24 0805 06/03/24 0851  HGB 11.3 11.2* 10.7* 10.6* 11.0* 10.8*    CMP     Component Value Date/Time   NA 140 06/03/2024 0851   NA 140 12/01/2023 1357   K 3.7 06/03/2024 0851   CL 107 06/03/2024 0851   CO2 25 06/03/2024 0851   GLUCOSE 121 (H) 06/03/2024 0851   BUN 14 06/03/2024 0851   BUN 16 12/01/2023 1357   CREATININE 0.63 06/03/2024 0851   CREATININE 0.46 (L) 07/22/2016 1026   CALCIUM  8.9 06/03/2024 0851   PROT 6.6 06/03/2024 0851   PROT 6.1 12/01/2023 1357   ALBUMIN 4.1 06/03/2024 0851   ALBUMIN 4.3 12/01/2023 1357   AST 25 06/03/2024 0851   ALT 11 06/03/2024 0851   ALKPHOS 89 06/03/2024 0851   BILITOT 0.4 06/03/2024 0851   GFRNONAA >60 06/03/2024 0851   GFRNONAA >89 07/22/2016 1026   GFRAA 114 02/16/2019 0941   GFRAA >89 07/22/2016 1026      Latest Ref Rng & Units 06/03/2024    8:51 AM 06/01/2024    8:05 AM 05/19/2024    3:23 PM  Hepatic Function  Total Protein 6.5 - 8.1 g/dL 6.6  6.0  6.4   Albumin 3.5 - 5.0 g/dL 4.1  3.4  3.9   AST 15 - 41 U/L 25  16  13  ALT 0 - 44 U/L 11  13  10    Alk Phosphatase 38 - 126 U/L 89  74  82   Total Bilirubin 0.0 - 1.2 mg/dL 0.4  0.6  0.5       Current Medications:   Current Outpatient Medications (Endocrine & Metabolic):    metFORMIN  (GLUCOPHAGE -XR) 500 MG 24 hr tablet, TAKE 2 TABLETS BY MOUTH TWICE A DAY   TRULICITY  3 MG/0.5ML SOAJ, INJECT 3 MG INTO THE SKIN AS DIRECTED ONCE A WEEK  Current Outpatient Medications (Cardiovascular):    metoprolol  succinate (TOPROL -XL) 50 MG 24 hr tablet, TAKE 1 TABLET BY MOUTH ONCE DAILY WITH MEALS OR  IMMEDIATELY  FOLLOWING  A  MEAL   nitroGLYCERIN  (NITROSTAT ) 0.4 MG SL tablet, Place 1 tablet (0.4 mg total) under the tongue  every 5 (five) minutes as needed.   rosuvastatin  (CRESTOR ) 10 MG tablet, Take 1 tablet by mouth once daily   sacubitril -valsartan  (ENTRESTO ) 24-26 MG, Take 1 tablet by mouth 2 (two) times daily.  Current Outpatient Medications (Respiratory):    benzonatate  (TESSALON ) 200 MG capsule, Take 1 capsule (200 mg total) by mouth 3 (three) times daily as needed for cough.   BREO ELLIPTA  200-25 MCG/ACT AEPB, Inhale 1 puff into the lungs daily.   cetirizine  (ZYRTEC ) 10 MG tablet, Take 10 mg by mouth daily.   Dextromethorphan-guaiFENesin  (MUCINEX  DM MAXIMUM STRENGTH) 60-1200 MG TB12, Take 1 tablet by mouth 2 (two) times daily.   DULERA  100-5 MCG/ACT AERO, Inhale 2 puffs into the lungs 2 (two) times daily.   fluticasone  (FLONASE ) 50 MCG/ACT nasal spray, USE 2 SPRAY(S) IN EACH NOSTRIL ONCE DAILY AS NEEDED FOR ALLERGIES   montelukast  (SINGULAIR ) 10 MG tablet, TAKE 1 TABLET BY MOUTH EVERY DAY (Patient taking differently: Take 10 mg by mouth at bedtime.)   VENTOLIN  HFA 108 (90 Base) MCG/ACT inhaler, INHALE 2 PUFFS BY MOUTH EVERY 4 HOURS AS NEEDED FOR WHEEZING FOR SHORTNESS OF BREATH  Current Outpatient Medications (Analgesics):    leflunomide  (ARAVA ) 20 MG tablet, Take 20 mg by mouth daily.   oxyCODONE -acetaminophen  (PERCOCET) 10-325 MG tablet, Take 1 tablet by mouth every 6 (six) hours as needed for pain.  Current Outpatient Medications (Hematological):    Cyanocobalamin  (B-12) 5000 MCG CAPS, Take 5,000 mcg by mouth daily.  Current Outpatient Medications (Other):    Accu-Chek Softclix Lancets lancets, Please use to check blood sugar up to three times per day. E11.42   Blood Glucose Monitoring Suppl (ACCU-CHEK GUIDE ME) w/Device KIT, USE AS DIRECTED TO  CHECK  BLOOD  SUGAR  UP  TO  3  TIMES  A  DAY   clobetasol  (TEMOVATE ) 0.05 % external solution, Apply 1 Application topically daily. qd up to 5 days a week to aa scalp prn psoriasis flares, avoid face, groin, axilla   cromolyn  (OPTICROM ) 4 % ophthalmic  solution, Place 1 drop into both eyes 3 (three) times daily as needed (itchy eyes).   diphenhydramine -acetaminophen  (TYLENOL  PM) 25-500 MG TABS tablet, Take 2 tablets by mouth at bedtime as needed (sleep).   fluconazole  (DIFLUCAN ) 150 MG tablet, Take one dose by mouth today.   gabapentin  (NEURONTIN ) 300 MG capsule, TAKE 1 CAPSULE BY MOUTH EVERYDAY AT BEDTIME   gentamicin  cream (GARAMYCIN ) 0.1 %, APPLY  CREAM TOPICALLY TO AFFECTED AREA ONCE DAILY TO  TOE   glucose blood (ACCU-CHEK GUIDE TEST) test strip, Please use to check blood sugar up to three times per day. E11.42   Lancets Misc. (ACCU-CHEK SOFTCLIX LANCET DEV) KIT,  Please use to check blood sugar up to three times per day. E11.42   loperamide  (IMODIUM ) 2 MG capsule, Take 1 capsule (2 mg total) by mouth as needed for diarrhea or loose stools.   mirabegron  ER (MYRBETRIQ ) 25 MG TB24 tablet, Take 25 mg by mouth daily.   mometasone  (ELOCON ) 0.1 % cream, APPLY 1 APPLICATION TOPICALLY DAILY AS NEEDED (RASH).   mupirocin  ointment (BACTROBAN ) 2 %, Apply topically daily. qd to open sores on arms, chest   mupirocin  ointment (BACTROBAN ) 2 %, Apply 1 Application topically.   nitrofurantoin (MACRODANTIN) 100 MG capsule, Take 100 mg by mouth daily.   omeprazole  (PRILOSEC) 40 MG capsule, Take 1 capsule by mouth once daily in the morning   sertraline  (ZOLOFT ) 50 MG tablet, Take 1 tablet (50 mg total) by mouth daily.   Thiamine HCl (VITAMIN B-1) 250 MG tablet, Take 250 mg by mouth daily.   triamcinolone  cream (KENALOG ) 0.1 %, Apply 1 Application topically 2 (two) times daily.   betamethasone  dipropionate 0.05 % lotion, Apply topically daily. (Patient not taking: Reported on 07/06/2024)  Medical History:  Past Medical History:  Diagnosis Date   Actinic keratosis    Allergic rhinitis 08/28/2006   Anemia    low iron    Anginal pain    Asthma, chronic 03/14/2010   Does seem to use her albuterol  excessively. Consider change to more manageable medicines and  treat more like COPD in the future.   Carpal tunnel syndrome, left 05/29/2009   Last Assessment & Plan:  Advised to wear wrist brace at night when she is sleeping.  If this does not help will consider having her go to sports medicine.   CHF (congestive heart failure) (HCC) 07/2022   HFrEF, NYHA Class I   Chronic frontal sinusitis 08/28/2016   Chronic left maxillary sinusitis 08/28/2016   Diverticulosis    Essential hypertension 10/30/2012   Gastroparesis 11/19/2011   DM and chronic narcotics contribute.  Causes dysphagia like symptoms    Generalized anxiety disorder 08/28/2006   GERD (gastroesophageal reflux disease)    Hearing loss 07/19/2015   Hearing aid in right ear   History of cataracts, bilateral    Hyperlipidemia 08/28/2006   Lumbar radiculopathy 04/07/2018   Lung nodules    Nonischemic cardiomyopathy (HCC)    Osteoporosis 08/08/2011   Pain in joint, pelvic region and thigh 11/18/2012   Radial styloid tenosynovitis 08/15/2013   Restless leg syndrome 05/14/2012   Seropositive rheumatoid arthritis 01/08/2010   Patient is being followed by rheumatologist at [facility] [place]  patient is on Plaquenil and as well as prednisone .   Changed to methotrexate and prednisone  in May of 2013  Patient does have a pain contract here with [facility] receives oxycodone  10/325 mg tabs every 6 hours. This has been titrated down from significant doses of methadone previously. Could attempt to tit   Shoulder joint pain 03/25/2011   The patient is compensating due to her not being able to use her left arm right now rotator cuff impingement if he continues to worsen would not be surprised if she does get a tear on this side as well   Squamous cell carcinoma in situ of skin 11/25/2014   Referred to dermatology for excision    Squamous cell carcinoma of skin 12/26/2014   Right distal pretibial. SCC-KA pattern.   Squamous cell carcinoma of skin 03/27/2015   Right inf. lat.  knee. SCC-KA pattern.    Squamous cell carcinoma of skin 10/07/2016  Left lateral calf superior. WD SCC.   Squamous cell carcinoma of skin 10/07/2016   Left lateral calf inferior. WD SCC.   Squamous cell carcinoma of skin 04/28/2018   Left mid med. pretibial. WD SCC.   Squamous cell carcinoma of skin 08/12/2019   Left mid med. pretibial sup. KA type. EDC   Squamous cell carcinoma of skin 08/12/2019   Left mid med. pretibial inferior. WD. EDC.   Tear of medial meniscus of knee, left 04/27/2009   Type II diabetes mellitus 08/28/2006   Overview:  Patient has her ups and downs, go in line with flares of her RA and prednisone  uses.  Patient does have hx of hypoglycemic events so would not try for perfect control but should have A1c goal around 7.0 Lab Results  Component Value Date   HGBA1C 6.9 12/27/2011   Last Assessment & Plan:  At goal. Will continue current metformin    Allergies:  Allergies  Allergen Reactions   Zofran  [Ondansetron  Hcl] Other (See Comments)    Prolong QT   Ace Inhibitors Cough    Occurred with lisinopril   Levofloxacin  Other (See Comments)    Felt things on her legs that were not there,made her stomach hurt   Lisinopril Cough   Sulfonamide Derivatives Hives and Itching   Sulfa Antibiotics Hives and Itching     Surgical History:  She  has a past surgical history that includes Appendectomy; Cholecystectomy; Abdominal hysterectomy; Carpal tunnel release (Right); Knee surgery (Right); Hernia repair; Shoulder surgery (Left); right foot bone spurs removed; left and right heart catheterization with coronary angiogram (N/A, 08/17/2013); Cataract extraction (Left); Colonoscopy; Upper gastrointestinal endoscopy; Rotator cuff repair (Right); lumbar back surgery (04/04/2018); Colonoscopy with propofol  (N/A, 05/10/2021); Esophagogastroduodenoscopy (egd) with propofol  (N/A, 05/10/2021); biopsy (05/10/2021); Back surgery; RIGHT/LEFT HEART CATH AND CORONARY ANGIOGRAPHY (N/A, 07/30/2022);  Knee arthroscopy with medial menisectomy (Right, 03/26/2023); and Synovectomy (Right, 03/26/2023). Family History:  Her family history includes Bell's palsy in her maternal grandmother; COPD in her sister; Cancer in her sister; Colon cancer in her daughter and maternal aunt; Diabetes in her daughter; Healthy in her daughter; Hypertension in her daughter; Lung cancer (age of onset: 70) in her brother; Rheum arthritis (age of onset: 12) in her sister; Stroke (age of onset: 12) in her father; Tremor in her mother.  REVIEW OF SYSTEMS  : All other systems reviewed and negative except where noted in the History of Present Illness.  PHYSICAL EXAM: BP (!) 82/48   Pulse 86   Ht 5' 2 (1.575 m)   Wt 115 lb 6 oz (52.3 kg)   BMI 21.10 kg/m  Physical Exam   GENERAL APPEARANCE: Well nourished, in no apparent distress HEENT: No cervical lymphadenopathy, unremarkable thyroid , sclerae anicteric, conjunctiva pink RESPIRATORY: Respiratory effort normal, BS equal bilateral without rales, rhonchi, wheezing CARDIO: RRR with no MRGs, peripheral pulses intact ABDOMEN: Soft, non distended, active bowel sounds in all 4 quadrants, no tenderness to palpation, no rebound, no mass appreciated RECTAL: declines MUSCULOSKELETAL: Full ROM, normal gait, without edema SKIN: Dry, intact without rashes or lesions. No jaundice. NEURO: Alert, oriented, no focal deficits PSYCH: Cooperative, normal mood and affect.  Alan JONELLE Coombs, PA-C 2:17 PM   "

## 2024-07-06 NOTE — Patient Instructions (Signed)
 Your provider has requested that you go to the basement level for lab work before leaving today. Press B on the elevator. The lab is located at the first door on the left as you exit the elevator.  Due to recent changes in healthcare laws, you may see the results of your imaging and laboratory studies on MyChart before your provider has had a chance to review them.  We understand that in some cases there may be results that are confusing or concerning to you. Not all laboratory results come back in the same time frame and the provider may be waiting for multiple results in order to interpret others.  Please give us  48 hours in order for your provider to thoroughly review all the results before contacting the office for clarification of your results.    Your provider has requested that you have an abdominal x ray before leaving today. Please go to the basement floor to our Radiology department for the test.   Thank you for trusting me with your gastrointestinal care!   Alan Coombs, PA-C  _______________________________________________________  If your blood pressure at your visit was 140/90 or greater, please contact your primary care physician to follow up on this.  _______________________________________________________  If you are age 75 or older, your body mass index should be between 23-30. Your Body mass index is 21.1 kg/m. If this is out of the aforementioned range listed, please consider follow up with your Primary Care Provider.  If you are age 6 or younger, your body mass index should be between 19-25. Your Body mass index is 21.1 kg/m. If this is out of the aformentioned range listed, please consider follow up with your Primary Care Provider.   ________________________________________________________  The [place] [facility] providers would like to encourage you to use MYCHART to communicate with providers for non-urgent requests or questions.  Due to long hold times on the  telephone, sending your provider a message by [facility] may be a faster and more efficient way to get a response.  Please allow 48 business hours for a response.  Please remember that this is for non-urgent requests.  _______________________________________________________  [facility] is using a team-based approach to care.  Your team is made up of your doctor and two to three APPS. Our APPS (Nurse Practitioners and Physician Assistants) work with your physician to ensure care continuity for you. They are fully qualified to address your health concerns and develop a treatment plan. They communicate directly with your gastroenterologist to care for you. Seeing the Advanced Practice Practitioners on your physician's team can help you by facilitating care more promptly, often allowing for earlier appointments, access to diagnostic testing, procedures, and other specialty referrals.

## 2024-07-08 ENCOUNTER — Other Ambulatory Visit: Payer: Self-pay | Admitting: Family Medicine

## 2024-07-08 DIAGNOSIS — K219 Gastro-esophageal reflux disease without esophagitis: Secondary | ICD-10-CM

## 2024-07-09 ENCOUNTER — Ambulatory Visit: Payer: Self-pay | Admitting: Physician Assistant

## 2024-07-09 ENCOUNTER — Ambulatory Visit

## 2024-07-09 DIAGNOSIS — D649 Anemia, unspecified: Secondary | ICD-10-CM | POA: Diagnosis not present

## 2024-07-09 LAB — FECAL OCCULT BLOOD, IMMUNOCHEMICAL: Fecal Occult Bld: NEGATIVE

## 2024-07-14 ENCOUNTER — Ambulatory Visit: Admitting: Podiatry

## 2024-07-14 DIAGNOSIS — E1151 Type 2 diabetes mellitus with diabetic peripheral angiopathy without gangrene: Secondary | ICD-10-CM

## 2024-07-14 DIAGNOSIS — G629 Polyneuropathy, unspecified: Secondary | ICD-10-CM | POA: Diagnosis not present

## 2024-07-14 DIAGNOSIS — M79674 Pain in right toe(s): Secondary | ICD-10-CM

## 2024-07-14 DIAGNOSIS — M79675 Pain in left toe(s): Secondary | ICD-10-CM | POA: Diagnosis not present

## 2024-07-14 DIAGNOSIS — B351 Tinea unguium: Secondary | ICD-10-CM | POA: Diagnosis not present

## 2024-07-16 ENCOUNTER — Telehealth: Payer: Self-pay

## 2024-07-16 ENCOUNTER — Other Ambulatory Visit: Payer: Self-pay | Admitting: Pain Medicine

## 2024-07-16 DIAGNOSIS — M5416 Radiculopathy, lumbar region: Secondary | ICD-10-CM

## 2024-07-16 DIAGNOSIS — E1142 Type 2 diabetes mellitus with diabetic polyneuropathy: Secondary | ICD-10-CM

## 2024-07-16 NOTE — Telephone Encounter (Signed)
 Patient calls nurse line requesting a new prescription for Trulicity .   She reports she needs a 90 day supply sent to [facility]. She reports a total of (3) pens.   She reports the way the prescription is written now, she is only getting (2) pens at a time.   Will forward to PCP.

## 2024-07-18 MED ORDER — TRULICITY 3 MG/0.5ML ~~LOC~~ SOAJ: 3.0000 mg | SUBCUTANEOUS | 1 refills | Status: AC

## 2024-07-19 NOTE — H&P (Signed)
 "     Chief Complaint: Normocytic anemia. Team is requesting a bone marrow biopsy for further evaluation.    Referring Physician(s): Dorsey,John T IV  Supervising Physician: Luverne Aran  Patient Status: [facility] - Out[facility]  History of Present Illness: Michelle [name] is a 74 y.o. femaleoutpatient. History of DM, RA, squamous cell carcinoma of the skin, HLD, HTN, CHF. Found to have normocytic anemia. Team is requesting a bone marrow biopsy for further evaluation. Differentials include iron  deficiency anemia and anemia of chronic disease due to her rheumatoid arthritis.   Currently without any significant complaints. Patient alert and laying in bed,calm. Denies any fevers, headache, chest pain, SOB, cough, abdominal pain, nausea, vomiting or bleeding.   ***Labs pending. All medications ar e within acceptable parameters. No pertinent allergies. Patient has been NPO since midnight.   Return precautions and treatment recommendations and follow-up discussed with the patient *** who is agreeable with the plan.    Past Medical History:  Diagnosis Date   Actinic keratosis    Allergic rhinitis 08/28/2006   Anemia    low iron    Anginal pain    Asthma, chronic 03/14/2010   Does seem to use her albuterol  excessively. Consider change to more manageable medicines and treat more like COPD in the future.   Carpal tunnel syndrome, left 05/29/2009   Last Assessment & Plan:  Advised to wear wrist brace at night when she is sleeping.  If this does not help will consider having her go to sports medicine.   CHF (congestive heart failure) (HCC) 07/2022   HFrEF, NYHA Class I   Chronic frontal sinusitis 08/28/2016   Chronic left maxillary sinusitis 08/28/2016   Diverticulosis    Essential hypertension 10/30/2012   Gastroparesis 11/19/2011   DM and chronic narcotics contribute.  Causes dysphagia like symptoms    Generalized anxiety disorder 08/28/2006   GERD (gastroesophageal reflux disease)    Hearing  loss 07/19/2015   Hearing aid in right ear   History of cataracts, bilateral    Hyperlipidemia 08/28/2006   Lumbar radiculopathy 04/07/2018   Lung nodules    Nonischemic cardiomyopathy (HCC)    Osteoporosis 08/08/2011   Pain in joint, pelvic region and thigh 11/18/2012   Radial styloid tenosynovitis 08/15/2013   Restless leg syndrome 05/14/2012   Seropositive rheumatoid arthritis 01/08/2010   Patient is being followed by rheumatologist at [facility]  patient is on Plaquenil and as well as prednisone .   Changed to methotrexate and prednisone  in May of 2013  Patient does have a pain contract here with [facility] receives oxycodone  10/325 mg tabs every 6 hours. This has been titrated down from significant doses of methadone previously. Could attempt to tit   Shoulder joint pain 03/25/2011   The patient is compensating due to her not being able to use her left arm right now rotator cuff impingement if he continues to worsen would not be surprised if she does get a tear on this side as well   Squamous cell carcinoma in situ of skin 11/25/2014   Referred to dermatology for excision    Squamous cell carcinoma of skin 12/26/2014   Right distal pretibial. SCC-KA pattern.   Squamous cell carcinoma of skin 03/27/2015   Right inf. lat. knee. SCC-KA pattern.    Squamous cell carcinoma of skin 10/07/2016   Left lateral calf superior. WD SCC.   Squamous cell carcinoma of skin 10/07/2016   Left lateral calf inferior. WD SCC.   Squamous  cell carcinoma of skin 04/28/2018   Left mid med. pretibial. WD SCC.   Squamous cell carcinoma of skin 08/12/2019   Left mid med. pretibial sup. KA type. EDC   Squamous cell carcinoma of skin 08/12/2019   Left mid med. pretibial inferior. WD. EDC.   Tear of medial meniscus of knee, left 04/27/2009   Type II diabetes mellitus 08/28/2006   Overview:  Patient has her ups and downs, go in line with flares of her RA and prednisone  uses.   Patient does have hx of hypoglycemic events so would not try for perfect control but should have A1c goal around 7.0 Lab Results  Component Value Date   HGBA1C 6.9 12/27/2011   Last Assessment & Plan:  At goal. Will continue current metformin     Past Surgical History:  Procedure Laterality Date   ABDOMINAL HYSTERECTOMY     APPENDECTOMY     BACK SURGERY     BIOPSY  05/10/2021   Procedure: BIOPSY;  Surgeon: Teressa Toribio SQUIBB, MD;  Location: [facility];  Service: Endoscopy;;  EGD and COLON   CARPAL TUNNEL RELEASE Right    CATARACT EXTRACTION Left    with lid lift   CHOLECYSTECTOMY     COLONOSCOPY     COLONOSCOPY WITH PROPOFOL  N/A 05/10/2021   Procedure: COLONOSCOPY WITH PROPOFOL ;  Surgeon: Teressa Toribio SQUIBB, MD;  Location: [facility];  Service: Endoscopy;  Laterality: N/A;   ESOPHAGOGASTRODUODENOSCOPY (EGD) WITH PROPOFOL  N/A 05/10/2021   Procedure: ESOPHAGOGASTRODUODENOSCOPY (EGD) WITH PROPOFOL ;  Surgeon: Teressa Toribio SQUIBB, MD;  Location: [facility];  Service: Endoscopy;  Laterality: N/A;   HERNIA REPAIR     KNEE ARTHROSCOPY WITH MEDIAL MENISECTOMY Right 03/26/2023   Procedure: KNEE ARTHROSCOPY WITH MEDIAL MENISECTOMY;  Surgeon: Cristy Bonner DASEN, MD;  Location: [facility] ORS;  Service: Orthopedics;  Laterality: Right;   KNEE SURGERY Right    x2- cartilage annd scar tissue   LEFT AND RIGHT HEART CATHETERIZATION WITH CORONARY ANGIOGRAM N/A 08/17/2013   Procedure: LEFT AND RIGHT HEART CATHETERIZATION WITH CORONARY ANGIOGRAM;  Surgeon: Erick JONELLE Bergamo, MD;  Location: [facility] CATH LAB;  Service: Cardiovascular;  Laterality: N/A;   lumbar back surgery  04/04/2018   done by neurosurgeron   right foot bone spurs removed     RIGHT/LEFT HEART CATH AND CORONARY ANGIOGRAPHY N/A 07/30/2022   Procedure: RIGHT/LEFT HEART CATH AND CORONARY ANGIOGRAPHY;  Surgeon: Elmira Newman PARAS, MD;  Location: [facility] INVASIVE CV LAB;  Service: Cardiovascular;  Laterality: N/A;   ROTATOR CUFF REPAIR Right    SHOULDER SURGERY Left     x2- rotatar cuff tear   SYNOVECTOMY Right 03/26/2023   Procedure: SYNOVECTOMY;  Surgeon: Cristy Bonner DASEN, MD;  Location: [facility];  Service: Orthopedics;  Laterality: Right;   UPPER GASTROINTESTINAL ENDOSCOPY      Allergies: Zofran  [ondansetron  hcl], Ace inhibitors, Levofloxacin , Lisinopril, Sulfonamide derivatives, and Sulfa antibiotics  Medications: Prior to Admission medications  Medication Sig Start Date End Date Taking? Authorizing Provider  Accu-Chek Softclix Lancets lancets Please use to check blood sugar up to three times per day. E11.42 01/09/24   [name], MD  benzonatate  (TESSALON ) 200 MG capsule Take 1 capsule (200 mg total) by mouth 3 (three) times daily as needed for cough. 02/15/24   Iola Lukes, FNP  betamethasone  dipropionate 0.05 % lotion Apply topically daily. 06/15/20   [provider]  Blood Glucose Monitoring Suppl (ACCU-CHEK GUIDE ME) w/Device KIT USE AS DIRECTED TO  CHECK  BLOOD  SUGAR  UP  TO  3  TIMES  A  DAY 04/09/24   Nicholas Bar, MD  BREO ELLIPTA  200-25 MCG/ACT AEPB Inhale 1 puff into the lungs daily.    [provider]  cetirizine  (ZYRTEC ) 10 MG tablet Take 10 mg by mouth daily.    [provider]  clobetasol  (TEMOVATE ) 0.05 % external solution Apply 1 Application topically daily. qd up to 5 days a week to aa scalp prn psoriasis flares, avoid face, groin, axilla 09/30/23   Hester Alm BROCKS, MD  cromolyn  (OPTICROM ) 4 % ophthalmic solution Place 1 drop into both eyes 3 (three) times daily as needed (itchy eyes). 05/05/20   [provider]  Cyanocobalamin  (B-12) 5000 MCG CAPS Take 5,000 mcg by mouth daily.    [provider]  Dextromethorphan-guaiFENesin  (MUCINEX  DM MAXIMUM STRENGTH) 60-1200 MG TB12 Take 1 tablet by mouth 2 (two) times daily. 02/15/24   Murrill, Samantha, FNP  diphenhydramine -acetaminophen  (TYLENOL  PM) 25-500 MG TABS tablet Take 2 tablets by mouth at bedtime as needed (sleep).    [provider]  Dulaglutide  (TRULICITY ) 3 MG/0.5ML SOAJ Inject 3 mg into the skin once a week. 07/18/24   [name], MD  DULERA  100-5 MCG/ACT AERO Inhale 2 puffs into the lungs 2 (two) times daily. 02/26/23   [provider]  fluconazole  (DIFLUCAN ) 150 MG tablet Take one dose by mouth today. 03/23/24   Galaway, Teven Mittman L, DPM  fluticasone  (FLONASE ) 50 MCG/ACT nasal spray USE 2 SPRAY(S) IN EACH NOSTRIL ONCE DAILY AS NEEDED FOR ALLERGIES 03/09/24   [name], MD  gabapentin  (NEURONTIN ) 300 MG capsule TAKE 1 CAPSULE BY MOUTH EVERYDAY AT BEDTIME 08/22/23   [name], MD  gentamicin  cream (GARAMYCIN ) 0.1 % APPLY  CREAM TOPICALLY TO AFFECTED AREA ONCE DAILY TO  TOE 05/11/24   Gaynel Delon CROME, DPM  glucose blood (ACCU-CHEK GUIDE TEST) test strip Please use to check blood sugar up to three times per day. E11.42 01/09/24   [name], MD  Lancets Misc. (ACCU-CHEK SOFTCLIX LANCET DEV) KIT Please use to check blood sugar up to three times per day. E11.42 01/09/24   [name], MD  leflunomide  (ARAVA ) 20 MG tablet Take 20 mg by mouth daily.    [provider]  loperamide  (IMODIUM ) 2 MG capsule Take 1 capsule (2 mg total) by mouth as needed for diarrhea or loose stools. 05/13/24   [name], MD  metFORMIN  (GLUCOPHAGE -XR) 500 MG 24 hr tablet TAKE 2 TABLETS BY MOUTH TWICE A DAY 06/28/24   [name], MD  metoprolol  succinate (TOPROL -XL) 50 MG 24 hr tablet TAKE 1 TABLET BY MOUTH ONCE DAILY WITH MEALS OR  IMMEDIATELY  FOLLOWING  A  MEAL 05/03/24   Tolia, Sunit, DO  mirabegron  ER (MYRBETRIQ ) 25 MG TB24 tablet Take 25 mg by mouth daily.    [provider]  mometasone  (ELOCON ) 0.1 % cream APPLY 1 APPLICATION TOPICALLY DAILY AS NEEDED (RASH). 02/04/23   Hester Alm BROCKS, MD  montelukast  (SINGULAIR ) 10 MG tablet TAKE 1 TABLET BY MOUTH EVERY DAY Patient taking differently: Take 10 mg by mouth at bedtime. 08/31/21   Austin Ade, MD  mupirocin  ointment (BACTROBAN ) 2 % Apply  topically daily. qd to open sores on arms, chest 09/30/23   Hester Alm BROCKS, MD  mupirocin  ointment (BACTROBAN ) 2 % Apply 1 Application topically. 01/24/22   [provider]  nitrofurantoin (MACRODANTIN) 100 MG capsule Take 100 mg by mouth daily. 01/04/24   [provider]  nitroGLYCERIN  (NITROSTAT ) 0.4 MG SL tablet  Place 1 tablet (0.4 mg total) under the tongue every 5 (five) minutes as needed. 11/26/23   Tolia, Sunit, DO  omeprazole  (PRILOSEC) 40 MG capsule Take 1 capsule by mouth once daily in the morning 07/08/24   [name], MD  oxyCODONE -acetaminophen  (PERCOCET) 10-325 MG tablet Take 1 tablet by mouth every 6 (six) hours as needed for pain. 07/05/24   [name], MD  rosuvastatin  (CRESTOR ) 10 MG tablet Take 1 tablet by mouth once daily 05/18/24   [name], MD  sacubitril -valsartan  (ENTRESTO ) 24-26 MG Take 1 tablet by mouth 2 (two) times daily. 04/27/24   Tolia, Sunit, DO  sertraline  (ZOLOFT ) 50 MG tablet Take 1 tablet (50 mg total) by mouth daily. 03/10/24   [name], MD  Thiamine HCl (VITAMIN B-1) 250 MG tablet Take 250 mg by mouth daily.    [provider]  triamcinolone  cream (KENALOG ) 0.1 % Apply 1 Application topically 2 (two) times daily. 08/11/13   [provider]  VENTOLIN  HFA 108 (90 Base) MCG/ACT inhaler INHALE 2 PUFFS BY MOUTH EVERY 4 HOURS AS NEEDED FOR WHEEZING FOR SHORTNESS OF BREATH 06/10/24   [name], MD     Family History  Problem Relation Age of Onset   Stroke Father 38   Colon cancer Maternal Aunt    Tremor Mother    Lung cancer Brother 24   Rheum arthritis Sister 6   Colon cancer Daughter    COPD Sister    Cancer Sister        in nose   Diabetes Daughter    Hypertension Daughter    Healthy Daughter    Bell's palsy Maternal Grandmother    Esophageal cancer Neg Hx    Rectal cancer Neg Hx    Stomach cancer Neg Hx     Social History   Socioeconomic History   Marital status: Married    Spouse name: Higher Education Careers Adviser   Number of children: 3   Years of education: 9   Highest education level: 9th grade  Occupational History   Occupation: Retired/disabled    Associate Professor: UNEMPLOYED  Tobacco Use   Smoking status: Former    Current packs/day: 0.00    Average packs/day: 1.5 packs/day for 5.0 years (7.5 ttl pk-yrs)    Types: Cigarettes    Start date: 05/31/1989    Quit date: 05/31/1994    Years since quitting: 30.1    Passive exposure: Past   Smokeless tobacco: Never  Vaping Use   Vaping status: Never Used  Substance and Sexual Activity   Alcohol use: No    Alcohol/week: 0.0 standard drinks of alcohol   Drug use: No   Sexual activity: Not on file  Other Topics Concern   Not on file  Social History Narrative   Lives with husband and granddgt.   Lots of family stressors including special-needs grandaughter who lives with them.      Advance Directives: None - Patient's plan is that her husband will make decisions for her if she were not capable of making informed decisions for herself. (Discussion with Krystal McDiarmid, MD 12/11/18)   Full Code: Desires CPR/ACLS  (Discussion with Krystal McDiarmid, MD 12/11/18)         Social Drivers of Health   Tobacco Use: Medium Risk (07/07/2024)   Received from [facility]   Patient History    Smoking Tobacco Use: Former    Smokeless Tobacco Use: Never    Passive Exposure: Not on file  Financial Resource Strain:  Low Risk (09/04/2023)   Overall Financial Resource Strain (CARDIA)    Difficulty of Paying Living Expenses: Not hard at all  Food Insecurity: No Food Insecurity (09/04/2023)   Hunger Vital Sign    Worried About Running Out of Food in the Last Year: Never true    Ran Out of Food in the Last Year: Never true  Transportation Needs: No Transportation Needs (09/04/2023)   PRAPARE - Administrator, Civil Service (Medical): No    Lack of Transportation (Non-Medical): No  Physical Activity: Inactive (09/04/2023)   Exercise Vital Sign    Days of  Exercise per Week: 0 days    Minutes of Exercise per Session: 0 min  Stress: No Stress Concern Present (09/04/2023)   [employer] of Occupational Health - Occupational Stress Questionnaire    Feeling of Stress : Not at all  Social Connections: Moderately Integrated (09/04/2023)   Social Connection and Isolation Panel    Frequency of Communication with Friends and Family: More than three times a week    Frequency of Social Gatherings with Friends and Family: More than three times a week    Attends Religious Services: 1 to 4 times per year    Active Member of Clubs or Organizations: No    Attends Banker Meetings: Never    Marital Status: Married  Depression (PHQ2-9): Low Risk (06/09/2024)   Depression (PHQ2-9)    PHQ-2 Score: 3  Recent Concern: Depression (PHQ2-9) - Medium Risk (04/30/2024)   Depression (PHQ2-9)    PHQ-2 Score: 9  Alcohol Screen: Low Risk (09/04/2023)   Alcohol Screen    Last Alcohol Screening Score (AUDIT): 0  Housing: Low Risk (09/04/2023)   Housing Stability Vital Sign    Unable to Pay for Housing in the Last Year: No    Number of Times Moved in the Last Year: 0    Homeless in the Last Year: No  Utilities: Not At Risk (09/04/2023)   AHC Utilities    Threatened with loss of utilities: No  Health Literacy: Adequate Health Literacy (09/04/2023)   B1300 Health Literacy    Frequency of need for help with medical instructions: Never    ECOG Status: {CHL ONC ECOG PS:[id number]}  Review of Systems: A 12 point ROS discussed and pertinent positives are indicated in the HPI above.  All other systems are negative.  Review of Systems  Vital Signs: There were no vitals taken for this visit.  Advance Care Plan: {Advance Care Eojw:[id number]}    Physical Exam  Imaging: DG Abd 1 View Result Date: 07/14/2024 EXAM: 1 VIEW XRAY OF THE ABDOMEN 07/06/2024 03:08:16 PM COMPARISON: 05/19/2024 CLINICAL HISTORY: evaluate stool burden and for obstruction FINDINGS: BOWEL:  Mild colonic stool burden. Nonobstructive bowel gas pattern. SOFT TISSUES: Surgical clips in right upper quadrant consistent with prior cholecystectomy. No abnormal calcifications. BONES: Surgical hardware in lower LUMBAR spine and bilateral SI JOINTS. No acute fracture. IMPRESSION: 1. Mild colonic stool burden. Electronically signed by: Kate Plummer MD 07/14/2024 06:07 PM EST RP Workstation: [id number]    Labs:  CBC: Recent Labs    04/30/24 1108 05/19/24 1523 06/01/24 0805 06/03/24 0851  WBC 8.0 8.3 8.3 7.2  HGB 10.7* 10.6* 11.0* 10.8*  HCT 32.9* 32.6* 33.5* 32.3*  PLT 292 312.0 286 274    COAGS: No results for input(s): INR, APTT in the last 8760 hours.  BMP: Recent Labs    12/10/23 0914 04/30/24 1237 05/19/24 1523 06/01/24 0805 06/03/24 0851  NA 140  140 138 138 140  K 4.6 3.3* 4.7 3.6 3.7  CL 108 108 105 104 107  CO2 26 25 27 25 25   GLUCOSE 127* 101* 98 136* 121*  BUN 14 10 16 21 14   CALCIUM  9.1 8.3* 9.0 9.2 8.9  CREATININE 0.53 0.56 0.54 0.61 0.63  GFRNONAA >60 >60  --  >60 >60    LIVER FUNCTION TESTS: Recent Labs    04/30/24 1237 05/19/24 1523 06/01/24 0805 06/03/24 0851  BILITOT 0.7 0.5 0.6 0.4  AST 20 13 16 25   ALT 12 10 13 11   ALKPHOS 57 82 74 89  PROT 5.1* 6.4 6.0* 6.6  ALBUMIN 3.0* 3.9 3.4* 4.1    TUMOR MARKERS: No results for input(s): AFPTM, CEA, CA199, CHROMGRNA in the last 8760 hours.  Assessment and Plan:  74 y.o. female outpatient. History of DM, RA, squamous cell carcinoma of the skin, HLD, HTN, CHF. Found to have normocytic anemia. Team is requesting a bone marrow biopsy for further evaluation. Differentials include iron  deficiency anemia and anemia of chronic disease due to her rheumatoid arthritis.   PLAN: IR Image Guided Bone Marrow Biopsy   Risks and benefits of bone marrow biopsy was discussed with the patient and/or patient's family including, but not limited to bleeding, infection, damage to adjacent structures or  low yield requiring additional tests.  All of the questions were answered and there is agreement to proceed.  Consent signed and in chart.   Thank you for this interesting consult.  I greatly enjoyed meeting Michelle [name] and look forward to participating in their care.  A copy of this report was sent to the requesting provider on this date.  Electronically Signed: Delon JAYSON Beagle, NP 07/19/2024, 7:43 PM   I spent a total of {New PWEU:[id number]} {New Out-Pt:[id number]}  {Established Out-Pt:304952003} in face to face in clinical consultation, greater than 50% of which was counseling/coordinating care for ***  "

## 2024-07-20 ENCOUNTER — Other Ambulatory Visit: Payer: Self-pay

## 2024-07-20 ENCOUNTER — Encounter (HOSPITAL_COMMUNITY): Payer: Self-pay

## 2024-07-20 ENCOUNTER — Ambulatory Visit (HOSPITAL_COMMUNITY)
Admission: RE | Admit: 2024-07-20 | Discharge: 2024-07-20 | Disposition: A | Source: Ambulatory Visit | Attending: Hematology and Oncology

## 2024-07-20 ENCOUNTER — Ambulatory Visit (HOSPITAL_COMMUNITY)
Admission: RE | Admit: 2024-07-20 | Discharge: 2024-07-20 | Disposition: A | Discharge status: Home / Self Care | Source: Ambulatory Visit | Attending: Hematology and Oncology | Admitting: Hematology and Oncology

## 2024-07-20 ENCOUNTER — Encounter: Payer: Self-pay | Admitting: Podiatry

## 2024-07-20 VITALS — BP 119/72 | HR 79 | Temp 97.9°F | Resp 17 | Ht 62.0 in | Wt 115.4 lb

## 2024-07-20 DIAGNOSIS — Z01818 Encounter for other preprocedural examination: Secondary | ICD-10-CM

## 2024-07-20 DIAGNOSIS — E1143 Type 2 diabetes mellitus with diabetic autonomic (poly)neuropathy: Secondary | ICD-10-CM | POA: Insufficient documentation

## 2024-07-20 DIAGNOSIS — Z7969 Long term (current) use of other immunomodulators and immunosuppressants: Secondary | ICD-10-CM | POA: Insufficient documentation

## 2024-07-20 DIAGNOSIS — E785 Hyperlipidemia, unspecified: Secondary | ICD-10-CM | POA: Insufficient documentation

## 2024-07-20 DIAGNOSIS — Z7951 Long term (current) use of inhaled steroids: Secondary | ICD-10-CM | POA: Insufficient documentation

## 2024-07-20 DIAGNOSIS — D649 Anemia, unspecified: Secondary | ICD-10-CM

## 2024-07-20 DIAGNOSIS — Z87891 Personal history of nicotine dependence: Secondary | ICD-10-CM | POA: Insufficient documentation

## 2024-07-20 DIAGNOSIS — Z638 Other specified problems related to primary support group: Secondary | ICD-10-CM | POA: Insufficient documentation

## 2024-07-20 DIAGNOSIS — Z555 Less than a high school diploma: Secondary | ICD-10-CM | POA: Insufficient documentation

## 2024-07-20 DIAGNOSIS — Z7985 Long-term (current) use of injectable non-insulin antidiabetic drugs: Secondary | ICD-10-CM | POA: Insufficient documentation

## 2024-07-20 DIAGNOSIS — Z85828 Personal history of other malignant neoplasm of skin: Secondary | ICD-10-CM | POA: Insufficient documentation

## 2024-07-20 DIAGNOSIS — M069 Rheumatoid arthritis, unspecified: Secondary | ICD-10-CM | POA: Insufficient documentation

## 2024-07-20 DIAGNOSIS — Z7984 Long term (current) use of oral hypoglycemic drugs: Secondary | ICD-10-CM | POA: Insufficient documentation

## 2024-07-20 DIAGNOSIS — I5020 Unspecified systolic (congestive) heart failure: Secondary | ICD-10-CM | POA: Insufficient documentation

## 2024-07-20 DIAGNOSIS — Z79899 Other long term (current) drug therapy: Secondary | ICD-10-CM | POA: Insufficient documentation

## 2024-07-20 DIAGNOSIS — I110 Hypertensive heart disease with heart failure: Secondary | ICD-10-CM | POA: Insufficient documentation

## 2024-07-20 DIAGNOSIS — D539 Nutritional anemia, unspecified: Secondary | ICD-10-CM | POA: Insufficient documentation

## 2024-07-20 LAB — CBC WITH DIFFERENTIAL/PLATELET
Abs Immature Granulocytes: 0.05 K/uL (ref 0.00–0.07)
Basophils Absolute: 0.1 K/uL (ref 0.0–0.1)
Basophils Relative: 1 %
Eosinophils Absolute: 0.2 K/uL (ref 0.0–0.5)
Eosinophils Relative: 2 %
HCT: 36.6 % (ref 36.0–46.0)
Hemoglobin: 11.5 g/dL — ABNORMAL LOW (ref 12.0–15.0)
Immature Granulocytes: 1 %
Lymphocytes Relative: 22 %
Lymphs Abs: 2.3 K/uL (ref 0.7–4.0)
MCH: 32.4 pg (ref 26.0–34.0)
MCHC: 31.4 g/dL (ref 30.0–36.0)
MCV: 103.1 fL — ABNORMAL HIGH (ref 80.0–100.0)
Monocytes Absolute: 0.9 K/uL (ref 0.1–1.0)
Monocytes Relative: 9 %
Neutro Abs: 6.9 K/uL (ref 1.7–7.7)
Neutrophils Relative %: 65 %
Platelets: 257 K/uL (ref 150–400)
RBC: 3.55 MIL/uL — ABNORMAL LOW (ref 3.87–5.11)
RDW: 13 % (ref 11.5–15.5)
WBC: 10.4 K/uL (ref 4.0–10.5)
nRBC: 0 % (ref 0.0–0.2)

## 2024-07-20 LAB — GLUCOSE, CAPILLARY: Glucose-Capillary: 100 mg/dL — ABNORMAL HIGH (ref 70–99)

## 2024-07-20 MED ORDER — FENTANYL CITRATE (PF) 100 MCG/2ML IJ SOLN
INTRAMUSCULAR | Status: AC | PRN
  Administered 2024-07-20: 50 ug via INTRAVENOUS

## 2024-07-20 MED ORDER — FENTANYL CITRATE (PF) 100 MCG/2ML IJ SOLN
INTRAMUSCULAR | Status: AC
  Filled 2024-07-20: qty 2

## 2024-07-20 MED ORDER — MIDAZOLAM HCL (PF) 2 MG/2ML IJ SOLN
INTRAMUSCULAR | Status: AC | PRN
  Administered 2024-07-20: 1 mg via INTRAVENOUS

## 2024-07-20 MED ORDER — SODIUM CHLORIDE 0.9 % IV SOLN: INTRAVENOUS | Status: DC

## 2024-07-20 MED ORDER — MIDAZOLAM HCL 2 MG/2ML IJ SOLN
INTRAMUSCULAR | Status: AC
  Filled 2024-07-20: qty 2

## 2024-07-20 NOTE — Procedures (Signed)
 Interventional Radiology Procedure Note  Procedure: CT guided bone marrow aspiration and biopsy  Complications: None  EBL: < 10 mL  Findings: Aspirate and core biopsy performed of bone marrow in right iliac bone.  Plan: Bedrest supine x 1 hrs  Ericha Whittingham T. Fredia Sorrow, M.D Pager:  [phone]

## 2024-07-20 NOTE — Progress Notes (Signed)
 "  Subjective:  Patient ID: Michelle [name], female    DOB: 07-16-1950,  MRN: [id number]  Michelle [name] presents to clinic today for at risk footcare. Patient has h/o diabetes, neuropathy and PAD and is seen for  and painful thick toenails that are difficult to trim. Pain interferes with ambulation. Aggravating factors include wearing enclosed shoe gear. Pain is relieved with periodic professional debridement. She has seen Dr. Janit for diabetic ulcer of left 3rd digit and it has completely healed. Her daughter is present during today's visit. Chief Complaint  Patient presents with   [facility]    Patient presents today for diabetic Foot Care patient last A1c was a 5.1. PCP Michelle [name]    New problem(s): None.   PCP is Michelle Booty, MD.  Allergies[1]  Review of Systems: Negative except as noted in the HPI.  Objective: No changes noted in today's physical examination. There were no vitals filed for this visit. Michelle [name] is a pleasant 74 y.o. female WD, WN in NAD. AAO x 3.  Vascular Examination: CFT <3 seconds b/l. Pedal pulses faintly palpable RLE and nonpalpable LLE. Skin temperature gradient warm to warm b/l. No pain with calf compression. No ischemia or gangrene. No cyanosis or clubbing noted b/l.    Neurological Examination: Sensation grossly intact b/l with 10 gram monofilament. Vibratory sensation intact b/l. Pt has subjective symptoms of neuropathy.  Dermatological Examination:  Pedal skin warm and supple b/l. Excoriation noted dorsal 5th MPJ with blanchable erythema. No warmth, no fluctuance. No drainage.. No interdigital macerations. Toenails 1-5 b/l thick, discolored, elongated with subungual debris and pain on dorsal palpation.  No corns, calluses nor porokeratotic lesions noted.  Musculoskeletal Examination: Muscle strength 5/5 to all lower extremity muscle groups bilaterally. HAV with bunion deformity noted b/l LE. Patient ambulates independent of any assistive  aids.  Radiographs: None  Assessment/Plan: 1. Pain due to onychomycosis of toenails of both feet   2. Neuropathy   3. Diabetes mellitus with peripheral circulatory disorder [facility])   Consent given for treatment. Patient examined. All patient's and/or POA's questions/concerns addressed on today's visit. Mycotic toenails 1-5 b/l debrided in length and girth without incident. Continue foot and shoe inspections daily. Monitor blood glucose per PCP/Endocrinologist's recommendations.Continue soft, supportive shoe gear daily. Report any pedal injuries to medical professional. Call office if there are any quesitons/concerns. -Patient/POA to call should there be question/concern in the interim.   Return in about 3 months (around 10/12/2024).  Delon LITTIE [name], DPM      [place] LOCATION: [address] [place] [address]                   Office [phone]   [place] LOCATION: [address] [place], [place] [address] Office [phone]     [1]  Allergies Allergen Reactions   Zofran  [Ondansetron  Hcl] Other (See Comments)    Prolong QT   Ace Inhibitors Cough    Occurred with lisinopril   Levofloxacin  Other (See Comments)    Felt things on her legs that were not there,made her stomach hurt   Lisinopril Cough   Sulfonamide Derivatives Hives and Itching   Sulfa Antibiotics Hives and Itching   "

## 2024-07-20 NOTE — Discharge Instructions (Signed)
 Please call Interventional Radiology clinic [phone] with any questions or concerns.  You may remove your dressing and shower tomorrow.   Bone Marrow Aspiration and Bone Marrow Biopsy, Adult, Care After This sheet gives you information about how to care for yourself after your procedure. Your health care provider may also give you more specific instructions. If you have problems or questions, contact your health care provider. What can I expect after the procedure? After the procedure, it is common to have: Mild pain and tenderness. Swelling. Bruising. Follow these instructions at home: Puncture site care Follow instructions from your health care provider about how to take care of the puncture site. Make sure you: Wash your hands with soap and water before and after you change your bandage (dressing). If soap and water are not available, use hand sanitizer. Change your dressing as told by your health care provider. Check your puncture site every day for signs of infection. Check for: More redness, swelling, or pain. Fluid or blood. Warmth. Pus or a bad smell.   Activity Return to your normal activities as told by your health care provider. Ask your health care provider what activities are safe for you. Do not lift anything that is heavier than 10 lb (4.5 kg), or the limit that you are told, until your health care provider says that it is safe. Do not drive for 24 hours if you were given a sedative during your procedure. General instructions Take over-the-counter and prescription medicines only as told by your health care provider. Do not take baths, swim, or use a hot tub until your health care provider approves. Ask your health care provider if you may take showers. You may only be allowed to take sponge baths. If directed, put ice on the affected area. To do this: Put ice in a plastic bag. Place a towel between your skin and the bag. Leave the ice on for 20 minutes, 2-3 times a  day. Keep all follow-up visits as told by your health care provider. This is important.   Contact a health care provider if: Your pain is not controlled with medicine. You have a fever. You have more redness, swelling, or pain around the puncture site. You have fluid or blood coming from the puncture site. Your puncture site feels warm to the touch. You have pus or a bad smell coming from the puncture site. Summary After the procedure, it is common to have mild pain, tenderness, swelling, and bruising. Follow instructions from your health care provider about how to take care of the puncture site and what activities are safe for you. Take over-the-counter and prescription medicines only as told by your health care provider. Contact a health care provider if you have any signs of infection, such as fluid or blood coming from the puncture site. This information is not intended to replace advice given to you by your health care provider. Make sure you discuss any questions you have with your health care provider. Document Revised: 11/03/2018 Document Reviewed: 11/03/2018 Elsevier Patient Education  2021 [employer]   Moderate Conscious Sedation, Adult, Care After This sheet gives you information about how to care for yourself after your procedure. Your health care provider may also give you more specific instructions. If you have problems or questions, contact your health care provider. What can I expect after the procedure? After the procedure, it is common to have: Sleepiness for several hours. Impaired judgment for several hours. Difficulty with balance. Vomiting if you eat too  soon. Follow these instructions at home: For the time period you were told by your health care provider: Rest. Do not participate in activities where you could fall or become injured. Do not drive or use machinery. Do not drink alcohol. Do not take sleeping pills or medicines that cause drowsiness. Do not  make important decisions or sign legal documents. Do not take care of children on your own.      Eating and drinking Follow the diet recommended by your health care provider. Drink enough fluid to keep your urine pale yellow. If you vomit: Drink water, juice, or soup when you can drink without vomiting. Make sure you have little or no nausea before eating solid foods.   General instructions Take over-the-counter and prescription medicines only as told by your health care provider. Have a responsible adult stay with you for the time you are told. It is important to have someone help care for you until you are awake and alert. Do not smoke. Keep all follow-up visits as told by your health care provider. This is important. Contact a health care provider if: You are still sleepy or having trouble with balance after 24 hours. You feel light-headed. You keep feeling nauseous or you keep vomiting. You develop a rash. You have a fever. You have redness or swelling around the IV site. Get help right away if: You have trouble breathing. You have new-onset confusion at home. Summary After the procedure, it is common to feel sleepy, have impaired judgment, or feel nauseous if you eat too soon. Rest after you get home. Know the things you should not do after the procedure. Follow the diet recommended by your health care provider and drink enough fluid to keep your urine pale yellow. Get help right away if you have trouble breathing or new-onset confusion at home. This information is not intended to replace advice given to you by your health care provider. Make sure you discuss any questions you have with your health care provider. Document Revised: 10/15/2019 Document Reviewed: 05/13/2019 Elsevier Patient Education  2021 [employer].

## 2024-07-22 LAB — SURGICAL PATHOLOGY

## 2024-07-26 ENCOUNTER — Other Ambulatory Visit: Payer: Self-pay | Admitting: Family Medicine

## 2024-07-28 ENCOUNTER — Encounter (HOSPITAL_COMMUNITY): Payer: Self-pay

## 2024-07-29 ENCOUNTER — Other Ambulatory Visit

## 2024-08-02 ENCOUNTER — Telehealth: Payer: Self-pay | Admitting: Cardiology

## 2024-08-02 NOTE — Telephone Encounter (Signed)
 Pt c/o medication issue:  1. Name of Medication: entresto    2. How are you currently taking this medication (dosage and times per day)? na  3. Are you having a reaction (difficulty breathing--STAT)? na  4. What is your medication issue? Pt stated that ins is no longer going to cover this med. She would like to stay on it . They will cover-Valslartan   Phone number Pior shara is [phone]

## 2024-08-02 NOTE — Telephone Encounter (Signed)
 Called patient about message. Patient is not sure if her insurance covers the generic medication of Entresto  and would like our team to figure it out for her. Will send to RX pool to see if they can help.

## 2024-08-03 ENCOUNTER — Other Ambulatory Visit: Payer: Self-pay

## 2024-08-03 ENCOUNTER — Telehealth: Payer: Self-pay | Admitting: Pharmacy Technician

## 2024-08-03 ENCOUNTER — Other Ambulatory Visit (HOSPITAL_COMMUNITY): Payer: Self-pay

## 2024-08-03 DIAGNOSIS — M059 Rheumatoid arthritis with rheumatoid factor, unspecified: Secondary | ICD-10-CM

## 2024-08-03 DIAGNOSIS — G8929 Other chronic pain: Secondary | ICD-10-CM

## 2024-08-03 MED ORDER — OXYCODONE-ACETAMINOPHEN 10-325 MG PO TABS: 1.0000 | ORAL_TABLET | Freq: Four times a day (QID) | ORAL | 0 refills | Status: AC | PRN

## 2024-08-03 NOTE — Discharge Instructions (Addendum)

## 2024-08-03 NOTE — Telephone Encounter (Signed)
 Pharmacy Patient Advocate Encounter   Received notification from Pt Calls Messages that prior authorization for Entresto  generic is required/requested.   Insurance verification completed.   The patient is insured through [employer].   Per test claim: Refill too soon. PA is not needed at this time. Medication was filled 07/23/24. Next eligible fill date is 08/17/24.

## 2024-08-04 ENCOUNTER — Inpatient Hospital Stay: Admission: RE | Admit: 2024-08-04 | Source: Ambulatory Visit

## 2024-08-04 ENCOUNTER — Other Ambulatory Visit

## 2024-08-05 ENCOUNTER — Telehealth: Payer: Self-pay

## 2024-08-05 DIAGNOSIS — E1142 Type 2 diabetes mellitus with diabetic polyneuropathy: Secondary | ICD-10-CM

## 2024-08-05 NOTE — Telephone Encounter (Signed)
 Patient calls nurse line regarding Trulicity  prescription. She reports that she requested 90 day supply for medication, however, pharmacy only gave her 30 day supply in January.   Called pharmacy as Dr. Romelle wrote for 90 days supply with last refill on 1/18. Pharmacist advised that they will work on getting 90 day supply ready for patient now.   Called patient and provided with update.   Chiquita JAYSON English, RN

## 2024-08-11 ENCOUNTER — Other Ambulatory Visit

## 2024-08-26 ENCOUNTER — Ambulatory Visit: Payer: 59 | Admitting: Physician Assistant

## 2024-09-06 ENCOUNTER — Ambulatory Visit: Payer: Self-pay | Admitting: Family Medicine

## 2024-09-06 ENCOUNTER — Encounter

## 2024-09-29 ENCOUNTER — Ambulatory Visit: Admitting: Dermatology

## 2024-10-20 ENCOUNTER — Ambulatory Visit: Admitting: Podiatry

## 2024-11-08 ENCOUNTER — Other Ambulatory Visit (HOSPITAL_BASED_OUTPATIENT_CLINIC_OR_DEPARTMENT_OTHER)

## 2024-12-13 ENCOUNTER — Inpatient Hospital Stay: Admitting: Hematology and Oncology

## 2024-12-13 ENCOUNTER — Inpatient Hospital Stay: Attending: Hematology and Oncology
